# Patient Record
Sex: Female | Born: 1950 | Race: Black or African American | Hispanic: No | Marital: Single | Smoking: Former smoker
Health system: Southern US, Community
[De-identification: ages and names within clinical notes are randomized; demographics above are authoritative.]

## PROBLEM LIST (undated history)

## (undated) DIAGNOSIS — N261 Atrophy of kidney (terminal): Secondary | ICD-10-CM

## (undated) DIAGNOSIS — I480 Paroxysmal atrial fibrillation: Secondary | ICD-10-CM

## (undated) DIAGNOSIS — F419 Anxiety disorder, unspecified: Secondary | ICD-10-CM

## (undated) DIAGNOSIS — E785 Hyperlipidemia, unspecified: Secondary | ICD-10-CM

## (undated) DIAGNOSIS — I472 Ventricular tachycardia, unspecified: Secondary | ICD-10-CM

## (undated) DIAGNOSIS — K449 Diaphragmatic hernia without obstruction or gangrene: Secondary | ICD-10-CM

## (undated) DIAGNOSIS — F32A Depression, unspecified: Secondary | ICD-10-CM

## (undated) DIAGNOSIS — I340 Nonrheumatic mitral (valve) insufficiency: Secondary | ICD-10-CM

## (undated) DIAGNOSIS — I2510 Atherosclerotic heart disease of native coronary artery without angina pectoris: Secondary | ICD-10-CM

## (undated) DIAGNOSIS — M353 Polymyalgia rheumatica: Secondary | ICD-10-CM

## (undated) DIAGNOSIS — Z8673 Personal history of transient ischemic attack (TIA), and cerebral infarction without residual deficits: Secondary | ICD-10-CM

## (undated) DIAGNOSIS — C8590 Non-Hodgkin lymphoma, unspecified, unspecified site: Secondary | ICD-10-CM

## (undated) DIAGNOSIS — K219 Gastro-esophageal reflux disease without esophagitis: Secondary | ICD-10-CM

## (undated) DIAGNOSIS — I255 Ischemic cardiomyopathy: Secondary | ICD-10-CM

## (undated) DIAGNOSIS — E119 Type 2 diabetes mellitus without complications: Secondary | ICD-10-CM

## (undated) DIAGNOSIS — I5022 Chronic systolic (congestive) heart failure: Secondary | ICD-10-CM

## (undated) DIAGNOSIS — N183 Chronic kidney disease, stage 3 unspecified: Secondary | ICD-10-CM

## (undated) DIAGNOSIS — K5790 Diverticulosis of intestine, part unspecified, without perforation or abscess without bleeding: Secondary | ICD-10-CM

## (undated) DIAGNOSIS — F329 Major depressive disorder, single episode, unspecified: Secondary | ICD-10-CM

## (undated) DIAGNOSIS — I10 Essential (primary) hypertension: Secondary | ICD-10-CM

## (undated) DIAGNOSIS — K2970 Gastritis, unspecified, without bleeding: Secondary | ICD-10-CM

## (undated) HISTORY — DX: Non-Hodgkin lymphoma, unspecified, unspecified site: C85.90

## (undated) HISTORY — DX: Ventricular tachycardia, unspecified: I47.20

## (undated) HISTORY — DX: Ventricular tachycardia: I47.2

## (undated) HISTORY — DX: Ischemic cardiomyopathy: I25.5

## (undated) HISTORY — DX: Essential (primary) hypertension: I10

## (undated) HISTORY — DX: Atherosclerotic heart disease of native coronary artery without angina pectoris: I25.10

## (undated) HISTORY — DX: Hyperlipidemia, unspecified: E78.5

## (undated) HISTORY — PX: CORONARY ANGIOPLASTY WITH STENT PLACEMENT: SHX49

## (undated) HISTORY — PX: OTHER SURGICAL HISTORY: SHX169

---

## 1999-01-03 ENCOUNTER — Emergency Department (HOSPITAL_COMMUNITY): Admission: EM | Admit: 1999-01-03 | Discharge: 1999-01-03 | Payer: Self-pay | Admitting: Emergency Medicine

## 2011-03-26 HISTORY — PX: CARDIAC DEFIBRILLATOR PLACEMENT: SHX171

## 2011-12-05 DIAGNOSIS — L280 Lichen simplex chronicus: Secondary | ICD-10-CM | POA: Insufficient documentation

## 2012-05-19 DIAGNOSIS — L659 Nonscarring hair loss, unspecified: Secondary | ICD-10-CM | POA: Insufficient documentation

## 2012-07-19 ENCOUNTER — Emergency Department (HOSPITAL_COMMUNITY)
Admission: EM | Admit: 2012-07-19 | Discharge: 2012-07-19 | Disposition: A | Discharge status: Home / Self Care | Payer: Medicare Other | Attending: Emergency Medicine | Admitting: Emergency Medicine

## 2012-07-19 ENCOUNTER — Encounter (HOSPITAL_COMMUNITY): Payer: Self-pay

## 2012-07-19 ENCOUNTER — Emergency Department (HOSPITAL_COMMUNITY): Payer: Medicare Other

## 2012-07-19 ENCOUNTER — Encounter (HOSPITAL_COMMUNITY): Payer: Self-pay | Admitting: Emergency Medicine

## 2012-07-19 DIAGNOSIS — R42 Dizziness and giddiness: Secondary | ICD-10-CM | POA: Insufficient documentation

## 2012-07-19 DIAGNOSIS — N390 Urinary tract infection, site not specified: Secondary | ICD-10-CM

## 2012-07-19 DIAGNOSIS — I252 Old myocardial infarction: Secondary | ICD-10-CM | POA: Insufficient documentation

## 2012-07-19 DIAGNOSIS — Z7982 Long term (current) use of aspirin: Secondary | ICD-10-CM | POA: Insufficient documentation

## 2012-07-19 DIAGNOSIS — R51 Headache: Secondary | ICD-10-CM | POA: Insufficient documentation

## 2012-07-19 DIAGNOSIS — I639 Cerebral infarction, unspecified: Secondary | ICD-10-CM

## 2012-07-19 DIAGNOSIS — Z9861 Coronary angioplasty status: Secondary | ICD-10-CM | POA: Insufficient documentation

## 2012-07-19 DIAGNOSIS — IMO0002 Reserved for concepts with insufficient information to code with codable children: Secondary | ICD-10-CM | POA: Insufficient documentation

## 2012-07-19 DIAGNOSIS — Z87898 Personal history of other specified conditions: Secondary | ICD-10-CM | POA: Insufficient documentation

## 2012-07-19 DIAGNOSIS — I6350 Cerebral infarction due to unspecified occlusion or stenosis of unspecified cerebral artery: Secondary | ICD-10-CM | POA: Insufficient documentation

## 2012-07-19 DIAGNOSIS — Z4502 Encounter for adjustment and management of automatic implantable cardiac defibrillator: Secondary | ICD-10-CM

## 2012-07-19 DIAGNOSIS — Z95810 Presence of automatic (implantable) cardiac defibrillator: Secondary | ICD-10-CM | POA: Insufficient documentation

## 2012-07-19 DIAGNOSIS — I2510 Atherosclerotic heart disease of native coronary artery without angina pectoris: Secondary | ICD-10-CM | POA: Insufficient documentation

## 2012-07-19 DIAGNOSIS — Z7902 Long term (current) use of antithrombotics/antiplatelets: Secondary | ICD-10-CM | POA: Insufficient documentation

## 2012-07-19 DIAGNOSIS — Z8673 Personal history of transient ischemic attack (TIA), and cerebral infarction without residual deficits: Secondary | ICD-10-CM | POA: Insufficient documentation

## 2012-07-19 DIAGNOSIS — T82198A Other mechanical complication of other cardiac electronic device, initial encounter: Secondary | ICD-10-CM | POA: Insufficient documentation

## 2012-07-19 DIAGNOSIS — Z8679 Personal history of other diseases of the circulatory system: Secondary | ICD-10-CM | POA: Insufficient documentation

## 2012-07-19 DIAGNOSIS — Z950 Presence of cardiac pacemaker: Secondary | ICD-10-CM | POA: Insufficient documentation

## 2012-07-19 DIAGNOSIS — Y831 Surgical operation with implant of artificial internal device as the cause of abnormal reaction of the patient, or of later complication, without mention of misadventure at the time of the procedure: Secondary | ICD-10-CM | POA: Insufficient documentation

## 2012-07-19 DIAGNOSIS — Z79899 Other long term (current) drug therapy: Secondary | ICD-10-CM | POA: Insufficient documentation

## 2012-07-19 HISTORY — DX: Personal history of transient ischemic attack (TIA), and cerebral infarction without residual deficits: Z86.73

## 2012-07-19 LAB — URINE MICROSCOPIC-ADD ON

## 2012-07-19 LAB — COMPREHENSIVE METABOLIC PANEL
Albumin: 3.7 g/dL (ref 3.5–5.2)
BUN: 18 mg/dL (ref 6–23)
Calcium: 9.8 mg/dL (ref 8.4–10.5)
GFR calc Af Amer: 36 mL/min — ABNORMAL LOW (ref >90)
Glucose, Bld: 129 mg/dL — ABNORMAL HIGH (ref 70–99)
Sodium: 141 mEq/L (ref 135–145)
Total Protein: 7.4 g/dL (ref 6.0–8.3)

## 2012-07-19 LAB — CBC WITH DIFFERENTIAL/PLATELET
Basophils Relative: 1 % (ref 0–1)
Eosinophils Absolute: 0.2 10*3/uL (ref 0.0–0.7)
Eosinophils Relative: 2 % (ref 0–5)
Lymphs Abs: 1.9 10*3/uL (ref 0.7–4.0)
MCH: 30.5 pg (ref 26.0–34.0)
MCHC: 35.2 g/dL (ref 30.0–36.0)
MCV: 86.9 fL (ref 78.0–100.0)
Monocytes Relative: 8 % (ref 3–12)
Neutrophils Relative %: 65 % (ref 43–77)
Platelets: 169 10*3/uL (ref 150–400)
RBC: 4.19 MIL/uL (ref 3.87–5.11)

## 2012-07-19 LAB — URINALYSIS, ROUTINE W REFLEX MICROSCOPIC
Glucose, UA: NEGATIVE mg/dL
Nitrite: NEGATIVE
Specific Gravity, Urine: 1.025 (ref 1.005–1.030)
pH: 5.5 (ref 5.0–8.0)

## 2012-07-19 LAB — TROPONIN I: Troponin I: <0.3 ng/mL (ref <0.30)

## 2012-07-19 LAB — MAGNESIUM: Magnesium: 2.1 mg/dL (ref 1.5–2.5)

## 2012-07-19 MED ORDER — CIPROFLOXACIN HCL 500 MG PO TABS: 500.0000 mg | ORAL_TABLET | Freq: Two times a day (BID) | ORAL | Status: DC

## 2012-07-19 NOTE — ED Notes (Signed)
Patient report obtained, care taken over at this time.  

## 2012-07-19 NOTE — ED Notes (Signed)
Patient being transported to pod C at this time, awaiting for consult to cardiology at this time. Patient in stable condition. No acute distress, resp are even and unlabored. Report called to next nurse.

## 2012-07-19 NOTE — ED Provider Notes (Signed)
History    [age]-year-old female presenting after she felt like her to a ICD fired. Happened just before arrival. Patient denies any physical activity. She states that she uses getting change when she felt a very sharp jolt in her chest. Patient seemingly fairly asymptomatic just prior to this. Maybe a brief sensation of lightheadedness. Denied any pain or shortness of breath. Currently no complaints. Has never had her device fired before. She's not quite sure indication for placement. She does have a a meal history of known coronary artery disease status post stenting. Her cardiology care has been in [place].  SN: [id number]  Arrival date & time 07/19/12  1610   First MD Initiated Contact with Patient 07/19/12 1620      Chief Complaint  Patient presents with  . AICD Problem    (Consider location/radiation/quality/duration/timing/severity/associated sxs/prior treatment) HPI  Past Medical History  Diagnosis Date  . Heart disease   . History of stroke 46  . Myocardial infarction     pt states 8  . History of lymphoma     Past Surgical History  Procedure Laterality Date  . Pacemaker insertion    . Cardiac defibrillator placement    . Breast lumpectomy    . Coronary angioplasty with stent placement      History reviewed. No pertinent family history.  History  Substance Use Topics  . Smoking status: Not on file  . Smokeless tobacco: Not on file  . Alcohol Use: Not on file    OB History   Grav Para Term Preterm Abortions TAB SAB Ect Mult Living                  Review of Systems  All systems reviewed and negative, other than as noted in HPI.   Allergies  Potassium-containing compounds; Contrast media; and Flagyl  Home Medications   Current Outpatient Rx  Name  Route  Sig  Dispense  Refill  . amLODipine (NORVASC) 10 MG tablet   Oral   Take 10 mg by mouth daily.         Marland Kitchen aspirin EC 81 MG tablet   Oral   Take 81 mg by mouth daily.         Marland Kitchen  atorvastatin (LIPITOR) 80 MG tablet   Oral   Take 80 mg by mouth at bedtime.         . betamethasone dipropionate (DIPROLENE) 0.05 % ointment   Topical   Apply 1 application topically 3 (three) times daily. Applies to forehead and scalp         . cilostazol (PLETAL) 50 MG tablet   Oral   Take 50 mg by mouth 2 (two) times daily.         . clopidogrel (PLAVIX) 75 MG tablet   Oral   Take 75 mg by mouth daily.         Marland Kitchen ezetimibe (ZETIA) 10 MG tablet   Oral   Take 10 mg by mouth at bedtime.          . isosorbide mononitrate (IMDUR) 30 MG 24 hr tablet   Oral   Take 30 mg by mouth daily.         Marland Kitchen lisinopril-hydrochlorothiazide (PRINZIDE,ZESTORETIC) 20-12.5 MG per tablet   Oral   Take 1 tablet by mouth daily.         . metoprolol succinate (TOPROL-XL) 100 MG 24 hr tablet   Oral   Take 50 mg by mouth daily. Take with or  immediately following a meal.         . Multiple Vitamin (MULTIVITAMIN WITH MINERALS) TABS   Oral   Take 1 tablet by mouth at bedtime.            BP 143/72  Pulse 75  Temp(Src) 98 F (36.7 C) (Oral)  Resp 21  SpO2 95%  Physical Exam  Nursing note and vitals reviewed. Constitutional: She appears well-developed and well-nourished. No distress.  HENT:  Head: Normocephalic and atraumatic.  Eyes: Conjunctivae are normal. Right eye exhibits no discharge. Left eye exhibits no discharge.  Neck: Neck supple.  Cardiovascular: Normal rate, regular rhythm and normal heart sounds.  Exam reveals no gallop and no friction rub.   No murmur heard. Pulmonary/Chest: Effort normal and breath sounds normal. No respiratory distress.  Abdominal: Soft. She exhibits no distension. There is no tenderness.  Musculoskeletal: She exhibits no edema and no tenderness.  Neurological: She is alert.  Skin: Skin is warm and dry.  Psychiatric: She has a normal mood and affect. Her behavior is normal. Thought content normal.    ED Course  Procedures (including  critical care time)  Labs Reviewed  TROPONIN I  MAGNESIUM   Dg Chest 2 View  07/19/2012  *RADIOLOGY REPORT*  Clinical Data: Headache.  CHEST - 2 VIEW  Comparison: No priors.  Findings: Lung volumes are normal.  No consolidative airspace disease.  No pleural effusions.  No pneumothorax.  No pulmonary nodule or mass noted.  Pulmonary vasculature and the cardiomediastinal silhouette are within normal limits. Atherosclerosis in the thoracic aorta.  Left-sided pacemaker device in place with lead tips projecting over the expected location of the right atrium and right ventricular apex.  IMPRESSION: 1. No radiographic evidence of acute cardiopulmonary disease. 2.  Atherosclerosis.   Original Report Authenticated By: Trudie Reed, M.D.    Ct Head Wo Contrast  07/19/2012  *RADIOLOGY REPORT*  Clinical Data: Headache.  CT HEAD WITHOUT CONTRAST  Technique:  Contiguous axial images were obtained from the base of the skull through the vertex without contrast.  Comparison: No priors.  Findings: There is an ill-defined area of decreased attenuation in the right insular region, concerning for acute/subacute right MCA territory ischemia. A well-defined focus of decreased attenuation in the left thalamus is compatible with an old lacunar infarction. The remainder the brain is otherwise normal in appearance. Specifically, no signs of acute intracerebral hemorrhage, and no definite mass, significant mass effect, hydrocephalus or abnormal intra or extra-axial fluid collections. No acute displaced skull fractures are identified.  Visualized paranasal sinuses and mastoids are well pneumatized.  IMPRESSION: 1.  Findings, as above, concerning for acute/subacute right MCA territory infarction in the insular region. 2.  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  Critical Value/emergent results were called by telephone at the time of interpretation on 07/19/2012 at 11:30 a.m. to Dr. Judd Lien, who verbally acknowledged these results.    Original Report Authenticated By: Trudie Reed, M.D.    EKG:  Rhythm: normal sinus Vent. rate 71 BPM PR interval 164 ms QRS duration 118 ms QT/QTc 428/465 ms NS intraventricular delay ST segments: NS St changes Comparison: none    1. Defibrillator discharge       MDM  61y with ICD firing. Interrogated. I episode of VF/VT successfully shocked. Labs from earlier ED visit reviewed. Additional labs normal. Pt has remained asymptomatic through ED stay. I feel safe for DC at this time. Understands need to follow-up with her cardiologist. Precautions  Raeford Razor, MD 07/20/12 [phone]

## 2012-07-19 NOTE — ED Notes (Addendum)
Pt presents to ED via EMS after defibrillator fired one time today.  EMS vitals 144/100.

## 2012-07-19 NOTE — ED Provider Notes (Addendum)
History     CSN: [id number]  Arrival date & time 07/19/12  4098   First MD Initiated Contact with Patient 07/19/12 [phone]      Chief Complaint  Patient presents with  . Headache    (Consider location/radiation/quality/duration/timing/severity/associated sxs/prior treatment) HPI Comments: Patient presents here with headache and concerns that she has a pseudomonas infection.  Her boyfriend was seen here last night for an infection in his groin region that she was told was pseudomonas.  She denies to me she has any cough, fever, chills.  No urinary complaints.  No nausea, vomiting, or diarrhea.    Patient is a 62 y.o. female presenting with headaches. The history is provided by the patient.  Headache Pain location:  Generalized Radiates to:  Does not radiate Onset quality:  Sudden Timing:  Constant Progression:  Unchanged Chronicity:  New Context: activity   Context: not exposure to bright light and not caffeine   Associated symptoms: no cough, no ear pain, no fever and no sore throat     Past Medical History  Diagnosis Date  . Heart disease   . History of stroke 66  . Myocardial infarction     pt states 7  . History of lymphoma     Past Surgical History  Procedure Laterality Date  . Pacemaker insertion    . Cardiac defibrillator placement    . Breast lumpectomy    . Coronary angioplasty with stent placement      No family history on file.  History  Substance Use Topics  . Smoking status: Not on file  . Smokeless tobacco: Not on file  . Alcohol Use: Not on file    OB History   Grav Para Term Preterm Abortions TAB SAB Ect Mult Living                  Review of Systems  Constitutional: Negative for fever and chills.  HENT: Negative for ear pain and sore throat.   Respiratory: Negative for cough and wheezing.   Cardiovascular: Negative for chest pain.  Genitourinary: Negative for frequency and flank pain.  Neurological: Positive for headaches.  All other  systems reviewed and are negative.    Allergies  Flagyl and Contrast media  Home Medications  No current outpatient prescriptions on file.  BP 146/85  Pulse 89  Temp(Src) 98.4 F (36.9 C) (Oral)  Resp 18  SpO2 97%  Physical Exam  Nursing note and vitals reviewed. Constitutional: She is oriented to person, place, and time. She appears well-developed and well-nourished. No distress.  HENT:  Head: Normocephalic and atraumatic.  Eyes: EOM are normal. Pupils are equal, round, and reactive to light.  Neck: Normal range of motion. Neck supple.  Cardiovascular: Normal rate and regular rhythm.  Exam reveals no gallop and no friction rub.   No murmur heard. Pulmonary/Chest: Effort normal and breath sounds normal. No respiratory distress. She has no wheezes.  Abdominal: Soft. Bowel sounds are normal. She exhibits no distension. There is no tenderness.  Musculoskeletal: Normal range of motion.  Neurological: She is alert and oriented to person, place, and time. No cranial nerve deficit. She exhibits normal muscle tone. Coordination normal.  Skin: Skin is warm and dry. She is not diaphoretic.    ED Course  Procedures (including critical care time)  Labs Reviewed - No data to display No results found.   No diagnosis found.    MDM  The patient presents here with complaints of headache and concerns for  a pseudomonal infection.  The workup reveals a subacute stroke on the ct scan which is consistent with the stroke she was diagnosed with last week in [place].  There is no evidence for bleeding or other emergent pathology that might be the cause of her headache.  She is neurologically intact and appears otherwise quite well.  I see no evidence for pseudomonal infection anywhere, but will treat with cipro for what appears to be a uti.  I have also advised her to follow up with her pcp to discuss the stroke and why this happened.  I see no emergent pathology that requires emergent workup or  admission.  Will discharge to home, return prn.        Geoffery Lyons, MD 07/19/12 1300  Geoffery Lyons, MD 07/19/12 1302

## 2012-07-19 NOTE — Discharge Instructions (Signed)
General Headache Without Cause A headache is pain or discomfort felt around the head or neck area. The specific cause of a headache may not be found. There are many causes and types of headaches. A few common ones are:  Tension headaches.  Migraine headaches.  Cluster headaches.  Chronic daily headaches. HOME CARE INSTRUCTIONS   Keep all follow-up appointments with your caregiver or any specialist referral.  Only take over-the-counter or prescription medicines for pain or discomfort as directed by your caregiver.  Lie down in a dark, quiet room when you have a headache.  Keep a headache journal to find out what may trigger your migraine headaches. For example, write down:  What you eat and drink.  How much sleep you get.  Any change to your diet or medicines.  Try massage or other relaxation techniques.  Put ice packs or heat on the head and neck. Use these 3 to 4 times per day for 15 to 20 minutes each time, or as needed.  Limit stress.  Sit up straight, and do not tense your muscles.  Quit smoking if you smoke.  Limit alcohol use.  Decrease the amount of caffeine you drink, or stop drinking caffeine.  Eat and sleep on a regular schedule.  Get 7 to 9 hours of sleep, or as recommended by your caregiver.  Keep lights dim if bright lights bother you and make your headaches worse. SEEK MEDICAL CARE IF:   You have problems with the medicines you were prescribed.  Your medicines are not working.  You have a change from the usual headache.  You have nausea or vomiting. SEEK IMMEDIATE MEDICAL CARE IF:   Your headache becomes severe.  You have a fever.  You have a stiff neck.  You have loss of vision.  You have muscular weakness or loss of muscle control.  You start losing your balance or have trouble walking.  You feel faint or pass out.  You have severe symptoms that are different from your first symptoms. MAKE SURE YOU:   Understand these  instructions.  Will watch your condition.  Will get help right away if you are not doing well or get worse. Document Released: 03/11/2005 Document Revised: 06/03/2011 Document Reviewed: 03/27/2011 [facility] Patient Information 2013 [place], [place].  Urinary Tract Infection A urinary tract infection (UTI) is often caused by a germ (bacteria). A UTI is usually helped with medicine (antibiotics) that kills germs. Take all the medicine until it is gone. Do this even if you are feeling better. You are usually better in 7 to 10 days. HOME CARE   Drink enough water and fluids to keep your pee (urine) clear or pale yellow. Drink:  Cranberry juice.  Water.  Avoid:  Caffeine.  Tea.  Bubbly (carbonated) drinks.  Alcohol.  Only take medicine as told by your doctor.  To prevent further infections:  Pee often.  After pooping (bowel movement), women should wipe from front to back. Use each tissue only once.  Pee before and after having sex (intercourse). Ask your doctor when your test results will be ready. Make sure you follow up and get your test results.  GET HELP RIGHT AWAY IF:   There is very bad back pain or lower belly (abdominal) pain.  You get the chills.  You have a fever.  Your baby is older than 3 months with a rectal temperature of 102 F (38.9 C) or higher.  Your baby is 65 months old or younger with a rectal  temperature of 100.4 F (38 C) or higher.  You feel sick to your stomach (nauseous) or throw up (vomit).  There is continued burning with peeing.  Your problems are not better in 3 days. Return sooner if you are getting worse. MAKE SURE YOU:   Understand these instructions.  Will watch your condition.  Will get help right away if you are not doing well or get worse. Document Released: 08/28/2007 Document Revised: 06/03/2011 Document Reviewed: 08/28/2007 [facility] Patient Information 2013 [place], [place].

## 2012-07-19 NOTE — ED Notes (Signed)
Patient ambulating to restroom at this time. Patient in stable condition. Will continue to monitor.

## 2012-07-19 NOTE — ED Notes (Signed)
Headache this morning after waking up. Pt denies vision changes, nausea, emesis. "My heart is speedy."

## 2012-07-19 NOTE — ED Notes (Signed)
MD at bedside. Dr. Delo. 

## 2012-07-20 LAB — URINE CULTURE

## 2012-08-07 ENCOUNTER — Emergency Department (HOSPITAL_COMMUNITY)
Admission: EM | Admit: 2012-08-07 | Discharge: 2012-08-07 | Disposition: A | Discharge status: Home / Self Care | Payer: Medicare Other | Attending: Emergency Medicine | Admitting: Emergency Medicine

## 2012-08-07 ENCOUNTER — Encounter (HOSPITAL_COMMUNITY): Payer: Self-pay | Admitting: Physical Medicine and Rehabilitation

## 2012-08-07 DIAGNOSIS — Z4502 Encounter for adjustment and management of automatic implantable cardiac defibrillator: Secondary | ICD-10-CM

## 2012-08-07 DIAGNOSIS — Y831 Surgical operation with implant of artificial internal device as the cause of abnormal reaction of the patient, or of later complication, without mention of misadventure at the time of the procedure: Secondary | ICD-10-CM | POA: Insufficient documentation

## 2012-08-07 DIAGNOSIS — IMO0002 Reserved for concepts with insufficient information to code with codable children: Secondary | ICD-10-CM | POA: Insufficient documentation

## 2012-08-07 DIAGNOSIS — Z9861 Coronary angioplasty status: Secondary | ICD-10-CM | POA: Insufficient documentation

## 2012-08-07 DIAGNOSIS — Z7982 Long term (current) use of aspirin: Secondary | ICD-10-CM | POA: Insufficient documentation

## 2012-08-07 DIAGNOSIS — Z79899 Other long term (current) drug therapy: Secondary | ICD-10-CM | POA: Insufficient documentation

## 2012-08-07 DIAGNOSIS — Z7902 Long term (current) use of antithrombotics/antiplatelets: Secondary | ICD-10-CM | POA: Insufficient documentation

## 2012-08-07 DIAGNOSIS — Z87898 Personal history of other specified conditions: Secondary | ICD-10-CM | POA: Insufficient documentation

## 2012-08-07 DIAGNOSIS — Z8673 Personal history of transient ischemic attack (TIA), and cerebral infarction without residual deficits: Secondary | ICD-10-CM | POA: Insufficient documentation

## 2012-08-07 DIAGNOSIS — I252 Old myocardial infarction: Secondary | ICD-10-CM | POA: Insufficient documentation

## 2012-08-07 DIAGNOSIS — T82198A Other mechanical complication of other cardiac electronic device, initial encounter: Secondary | ICD-10-CM | POA: Insufficient documentation

## 2012-08-07 DIAGNOSIS — Z8679 Personal history of other diseases of the circulatory system: Secondary | ICD-10-CM | POA: Insufficient documentation

## 2012-08-07 LAB — POCT I-STAT, CHEM 8
BUN: 22 mg/dL (ref 6–23)
Calcium, Ion: 1.21 mmol/L (ref 1.13–1.30)
Chloride: 106 mEq/L (ref 96–112)
Creatinine, Ser: 1.5 mg/dL — ABNORMAL HIGH (ref 0.50–1.10)
TCO2: 27 mmol/L (ref 0–100)

## 2012-08-07 LAB — MAGNESIUM: Magnesium: 2 mg/dL (ref 1.5–2.5)

## 2012-08-07 MED ORDER — MAGNESIUM SULFATE 40 MG/ML IJ SOLN: 1.0000 g | Freq: Once | INTRAMUSCULAR | Status: DC

## 2012-08-07 MED ORDER — MAGNESIUM OXIDE 400 (241.3 MG) MG PO TABS
400.0000 mg | ORAL_TABLET | Freq: Once | ORAL | Status: AC
  Administered 2012-08-07: 400 mg via ORAL
  Filled 2012-08-07: qty 1

## 2012-08-07 MED ORDER — POTASSIUM CHLORIDE CRYS ER 20 MEQ PO TBCR
40.0000 meq | EXTENDED_RELEASE_TABLET | Freq: Once | ORAL | Status: AC
  Administered 2012-08-07: 40 meq via ORAL
  Filled 2012-08-07: qty 2

## 2012-08-07 NOTE — ED Provider Notes (Signed)
History     CSN: [id number]  Arrival date & time 08/07/12  0804   First MD Initiated Contact with Patient 08/07/12 [phone]      Chief Complaint  Patient presents with  . AICD Problem    (Consider location/radiation/quality/duration/timing/severity/associated sxs/prior treatment) HPI This 62 year old female has a [employer] ICD the discharged this morning and the patient was asymptomatic prior to and after the discharge of the defibrillator, she is no chest pain shortness of breath lightheadedness syncope or amnesia focal neurologic symptoms or other concerns. She was in the ED within the last couple weeks for the same problem. Her interrogation at that time revealed one episode of VF/VT converted. Her cardiologist is in [place], [place] Dr. Rockne Menghini [phone]. There is no treatment prior to arrival. Past Medical History  Diagnosis Date  . Heart disease   . History of stroke 82  . Myocardial infarction     pt states 7  . History of lymphoma     Past Surgical History  Procedure Laterality Date  . Pacemaker insertion    . Cardiac defibrillator placement    . Breast lumpectomy    . Coronary angioplasty with stent placement      No family history on file.  History  Substance Use Topics  . Smoking status: Never Smoker   . Smokeless tobacco: Not on file  . Alcohol Use: No    OB History   Grav Para Term Preterm Abortions TAB SAB Ect Mult Living                  Review of Systems 10 Systems reviewed and are negative for acute change except as noted in the HPI. Allergies  Potassium-containing compounds; Contrast media; and Flagyl  Home Medications   Current Outpatient Rx  Name  Route  Sig  Dispense  Refill  . amLODipine (NORVASC) 10 MG tablet   Oral   Take 10 mg by mouth daily.         Marland Kitchen aspirin EC 81 MG tablet   Oral   Take 81 mg by mouth daily.         Marland Kitchen atorvastatin (LIPITOR) 80 MG tablet   Oral   Take 80 mg by mouth at bedtime.         . betamethasone  dipropionate (DIPROLENE) 0.05 % ointment   Topical   Apply 1 application topically 3 (three) times daily. Applies to forehead and scalp         . cilostazol (PLETAL) 50 MG tablet   Oral   Take 50 mg by mouth 2 (two) times daily.         . clopidogrel (PLAVIX) 75 MG tablet   Oral   Take 75 mg by mouth daily.         Marland Kitchen ezetimibe (ZETIA) 10 MG tablet   Oral   Take 10 mg by mouth at bedtime.          . isosorbide mononitrate (IMDUR) 30 MG 24 hr tablet   Oral   Take 30 mg by mouth daily.         Marland Kitchen lisinopril-hydrochlorothiazide (PRINZIDE,ZESTORETIC) 20-12.5 MG per tablet   Oral   Take 1 tablet by mouth daily.         . metoprolol succinate (TOPROL-XL) 100 MG 24 hr tablet   Oral   Take 50 mg by mouth 2 (two) times daily. Take with or immediately following a meal.         . Multiple  Vitamin (MULTIVITAMIN WITH MINERALS) TABS   Oral   Take 1 tablet by mouth at bedtime.            BP 135/73  Pulse 65  Temp(Src) 97.3 F (36.3 C) (Oral)  Resp 18  SpO2 100%  Physical Exam  Nursing note and vitals reviewed. Constitutional:  Awake, alert, nontoxic appearance.  HENT:  Head: Atraumatic.  Eyes: Right eye exhibits no discharge. Left eye exhibits no discharge.  Neck: Neck supple.  Cardiovascular: Normal rate and regular rhythm.   No murmur heard. Pulmonary/Chest: Effort normal and breath sounds normal. No respiratory distress. She has no wheezes. She has no rales. She exhibits no tenderness.  Abdominal: Soft. There is no tenderness. There is no rebound.  Musculoskeletal: She exhibits no edema and no tenderness.  Baseline ROM, no obvious new focal weakness.  Neurological: She is alert.  Mental status and motor strength appears baseline for patient and situation.  Skin: No rash noted.  Psychiatric: She has a normal mood and affect.    ED Course  Procedures (including critical care time) ECG: Sinus rhythm, history the 64, normal axis, nonspecific  intraventricular conduction delay with QRS duration 116 ms, inverted T waves inferolaterally this, no significant change noted compared with April 2014 D/w Cards Ladona Ridgel in [place] recs check Mag level then discharge.  Pt stable in ED with no significant deterioration in condition.Patient / Family / Caregiver informed of clinical course, understand medical decision-making process, and agree with plan.  Labs Reviewed  POCT I-STAT, CHEM 8 - Abnormal; Notable for the following:    Potassium 3.3 (*)    Creatinine, Ser 1.50 (*)    All other components within normal limits  MAGNESIUM   No results found.   1. Defibrillator discharge       MDM  I doubt any other EMC precluding discharge at this time including, but not necessarily limited to the following:ACS.        Hurman Horn, MD 08/17/12 [phone]

## 2012-08-07 NOTE — ED Notes (Signed)
Pt presents to department via [facility]. States defibrillator fired this morning around 7:15am. States this occurred last week as well. Did follow up with cardiologist. Denies pain at the time. Respirations unlabored. Pt is conscious alert and oriented x4.

## 2012-08-07 NOTE — ED Notes (Signed)
Pt resting quietly at the time. Vital signs stable. Charge RN called to interrogate pacemaker.

## 2012-09-30 ENCOUNTER — Encounter: Payer: Self-pay | Admitting: *Deleted

## 2012-10-20 DIAGNOSIS — E785 Hyperlipidemia, unspecified: Secondary | ICD-10-CM | POA: Insufficient documentation

## 2012-10-20 DIAGNOSIS — I509 Heart failure, unspecified: Secondary | ICD-10-CM | POA: Insufficient documentation

## 2012-10-20 DIAGNOSIS — I219 Acute myocardial infarction, unspecified: Secondary | ICD-10-CM | POA: Insufficient documentation

## 2012-10-20 DIAGNOSIS — C8590 Non-Hodgkin lymphoma, unspecified, unspecified site: Secondary | ICD-10-CM | POA: Insufficient documentation

## 2012-10-20 DIAGNOSIS — I519 Heart disease, unspecified: Secondary | ICD-10-CM | POA: Insufficient documentation

## 2012-10-20 DIAGNOSIS — I10 Essential (primary) hypertension: Secondary | ICD-10-CM | POA: Insufficient documentation

## 2012-10-20 DIAGNOSIS — Z8673 Personal history of transient ischemic attack (TIA), and cerebral infarction without residual deficits: Secondary | ICD-10-CM | POA: Insufficient documentation

## 2012-10-20 DIAGNOSIS — N289 Disorder of kidney and ureter, unspecified: Secondary | ICD-10-CM | POA: Insufficient documentation

## 2012-10-21 ENCOUNTER — Encounter: Payer: Self-pay | Admitting: Cardiovascular Disease

## 2012-10-21 ENCOUNTER — Ambulatory Visit (INDEPENDENT_AMBULATORY_CARE_PROVIDER_SITE_OTHER): Payer: Medicare Other | Admitting: *Deleted

## 2012-10-21 ENCOUNTER — Ambulatory Visit (INDEPENDENT_AMBULATORY_CARE_PROVIDER_SITE_OTHER): Payer: Medicare Other | Admitting: Cardiovascular Disease

## 2012-10-21 VITALS — BP 112/70 | HR 80 | Ht 65.0 in | Wt 205.0 lb

## 2012-10-21 DIAGNOSIS — I428 Other cardiomyopathies: Secondary | ICD-10-CM

## 2012-10-21 DIAGNOSIS — I10 Essential (primary) hypertension: Secondary | ICD-10-CM

## 2012-10-21 DIAGNOSIS — I509 Heart failure, unspecified: Secondary | ICD-10-CM

## 2012-10-21 DIAGNOSIS — Z95810 Presence of automatic (implantable) cardiac defibrillator: Secondary | ICD-10-CM

## 2012-10-21 DIAGNOSIS — I5022 Chronic systolic (congestive) heart failure: Secondary | ICD-10-CM

## 2012-10-21 DIAGNOSIS — I219 Acute myocardial infarction, unspecified: Secondary | ICD-10-CM

## 2012-10-21 LAB — ICD DEVICE OBSERVATION
AL IMPEDENCE ICD: 475 Ohm
AL THRESHOLD: 0.75 V
ATRIAL PACING ICD: 62.41 pct
BATTERY VOLTAGE: 3.1278 V
CHARGE TIME: 10.49 s
DEV-0020ICD: NEGATIVE
PACEART VT: 0
RV LEAD THRESHOLD: 0.75 V
TOT-0006: 20130104000000
TZAT-0001ATACH: 3
TZAT-0002ATACH: NEGATIVE
TZAT-0005FASTVT: 88 pct
TZAT-0011FASTVT: 10 ms
TZAT-0012ATACH: 150 ms
TZAT-0012ATACH: 150 ms
TZAT-0012FASTVT: 200 ms
TZAT-0012SLOWVT: 200 ms
TZAT-0013FASTVT: 1
TZAT-0019ATACH: 6 V
TZAT-0019FASTVT: 8 V
TZAT-0020ATACH: 1.5 ms
TZAT-0020ATACH: 1.5 ms
TZAT-0020SLOWVT: 1.5 ms
TZON-0003FASTVT: 280 ms
TZON-0003SLOWVT: 360 ms
TZON-0003VSLOWVT: 370 ms
TZON-0004SLOWVT: 16
TZON-0005SLOWVT: 12
TZST-0001ATACH: 6
TZST-0001FASTVT: 3
TZST-0001FASTVT: 5
TZST-0001SLOWVT: 2
TZST-0001SLOWVT: 6
TZST-0002ATACH: NEGATIVE
TZST-0002SLOWVT: NEGATIVE
TZST-0002SLOWVT: NEGATIVE
TZST-0002SLOWVT: NEGATIVE
TZST-0003FASTVT: 35 J
TZST-0003FASTVT: 35 J
TZST-0003FASTVT: 35 J
VENTRICULAR PACING ICD: 0.04 pct
VF: 0

## 2012-10-21 NOTE — Assessment & Plan Note (Signed)
Well controlled.  Continue current medications and low sodium Dash type diet.    

## 2012-10-21 NOTE — Progress Notes (Signed)
Patient ID: [name] Terry, female   DOB: [birth year], 62 y.o.   MRN: [id number] 62 yo seen by Dr Earna Coder in [place]  No records  Referred by [facility] family practice. She has poor insight into her history and no records available from cardiology.  Has had issues for 2-3 years Describes 7 heart attacks and what sounds like ischemic DCM.  Last stent ? A year or two ago Intervention done in [place]. She got an AICD in [place] at [facility].  Indicates that device went off a month ago.  No dyspnea or chest pain at this time.  Claims to be compliant with meds.  On Pletal with some pain in legs when ambulating ? PVD  Seen in our ER 5/16 for AICD discharge EF discussed with Dr Liliana Cline and Mg ok  Appropriate discharge for VT/VF  Followed up in [place] and no admission Also appears to have fired appropriately in April No other records at [facility]  Interogated device with Delsa Grana:  May had two appr Rx for VT  First ATP and second with 24J shock.  Impedence and thresholds are fine Optivol normal with no signs of increased volume  ROS: Denies fever, malais, weight loss, blurry vision, decreased visual acuity, cough, sputum, SOB, hemoptysis, pleuritic pain, palpitaitons, heartburn, abdominal pain, melena, lower extremity edema, claudication, or rash.  All other systems reviewed and negative   General: Affect appropriate Healthy:  appears stated age HEENT: normal Neck supple with no adenopathy JVP normal no bruits no thyromegaly Lungs clear with no wheezing and good diaphragmatic motion Heart:  S1/S2 no murmur,rub, gallop or click PMI normal Abdomen: benighn, BS positve, no tenderness, no AAA no bruit.  No HSM or HJR Distal pulses intact with no bruits No edema AICD under left clavicle Neuro non-focal Skin warm and dry No muscular weakness  Medications Current Outpatient Prescriptions  Medication Sig Dispense Refill  . amLODipine (NORVASC) 10 MG tablet Take 10 mg by mouth daily.      Marland Kitchen aspirin EC 81 MG  tablet Take 81 mg by mouth daily.      Marland Kitchen atorvastatin (LIPITOR) 80 MG tablet Take 80 mg by mouth at bedtime.      . betamethasone dipropionate (DIPROLENE) 0.05 % ointment Apply 1 application topically 3 (three) times daily. Applies to forehead and scalp      . cilostazol (PLETAL) 50 MG tablet Take 50 mg by mouth 2 (two) times daily.      . clopidogrel (PLAVIX) 75 MG tablet Take 75 mg by mouth daily.      Marland Kitchen ezetimibe (ZETIA) 10 MG tablet Take 10 mg by mouth at bedtime.       . isosorbide mononitrate (IMDUR) 30 MG 24 hr tablet Take 30 mg by mouth daily.      Marland Kitchen lisinopril-hydrochlorothiazide (PRINZIDE,ZESTORETIC) 20-12.5 MG per tablet Take 1 tablet by mouth daily.      . metoprolol succinate (TOPROL-XL) 100 MG 24 hr tablet Take 50 mg by mouth 2 (two) times daily. Take with or immediately following a meal.      . Multiple Vitamin (MULTIVITAMIN WITH MINERALS) TABS Take 1 tablet by mouth at bedtime.       . potassium chloride SA (K-DUR,KLOR-CON) 20 MEQ tablet Take 20 mEq by mouth daily.       No current facility-administered medications for this visit.    Allergies Potassium-containing compounds; Contrast media; and Flagyl  Family History: Family History  Problem Relation Age of Onset  . Diabetes Father   .  Hypertension Father   . Heart disease Father   . Alcoholism Father   . Hypertension Mother   . Heart disease Mother   . Breast cancer Mother   . Diabetes Brother   . Diabetes Brother   . Diabetes Brother   . Diabetes Brother   . Hypertension Sister   . Heart disease Sister   . Alcoholism Sister   . Thyroid disease Sister     Social History: History   Social History  . Marital Status: Single    Spouse Name: N/A    Number of Children: 2  . Years of Education: N/A   Occupational History  . Not on file.   Social History Main Topics  . Smoking status: Current Some Day Smoker -- 0.25 packs/day  . Smokeless tobacco: Not on file  . Alcohol Use: No  . Drug Use: No  .  Sexually Active: Not on file   Other Topics Concern  . Not on file   Social History Narrative  . No narrative on file    Electrocardiogram:  08/09/12  SR rate 64  IVCD ? Old IMI  Assessment and Plan

## 2012-10-21 NOTE — Assessment & Plan Note (Signed)
Remote interogation set up. Two appr Rx in May and normal function.  F/U with EP in 3 months.

## 2012-10-21 NOTE — Assessment & Plan Note (Signed)
Will get records form [place]  No chest pain.  Stable

## 2012-10-21 NOTE — Assessment & Plan Note (Signed)
Euvolemic continue current meds Optivol activated on AICD and normal

## 2012-10-21 NOTE — Progress Notes (Signed)
ICD check with ICM in office. 

## 2012-11-06 ENCOUNTER — Encounter: Payer: Self-pay | Admitting: Internal Medicine

## 2012-11-06 ENCOUNTER — Ambulatory Visit (INDEPENDENT_AMBULATORY_CARE_PROVIDER_SITE_OTHER): Payer: Medicare Other | Admitting: Internal Medicine

## 2012-11-06 ENCOUNTER — Other Ambulatory Visit: Payer: Self-pay

## 2012-11-06 VITALS — BP 117/77 | HR 90 | Ht 65.0 in | Wt 204.0 lb

## 2012-11-06 DIAGNOSIS — Z9861 Coronary angioplasty status: Secondary | ICD-10-CM | POA: Insufficient documentation

## 2012-11-06 DIAGNOSIS — Z95810 Presence of automatic (implantable) cardiac defibrillator: Secondary | ICD-10-CM

## 2012-11-06 DIAGNOSIS — I472 Ventricular tachycardia: Secondary | ICD-10-CM

## 2012-11-06 DIAGNOSIS — I519 Heart disease, unspecified: Secondary | ICD-10-CM

## 2012-11-06 DIAGNOSIS — I2589 Other forms of chronic ischemic heart disease: Secondary | ICD-10-CM

## 2012-11-06 DIAGNOSIS — I509 Heart failure, unspecified: Secondary | ICD-10-CM

## 2012-11-06 DIAGNOSIS — I4729 Other ventricular tachycardia: Secondary | ICD-10-CM

## 2012-11-06 DIAGNOSIS — I2510 Atherosclerotic heart disease of native coronary artery without angina pectoris: Secondary | ICD-10-CM

## 2012-11-06 DIAGNOSIS — I255 Ischemic cardiomyopathy: Secondary | ICD-10-CM | POA: Insufficient documentation

## 2012-11-06 MED ORDER — METOPROLOL SUCCINATE ER 50 MG PO TB24: 50.0000 mg | ORAL_TABLET | Freq: Two times a day (BID) | ORAL | Status: DC

## 2012-11-06 MED ORDER — METOPROLOL SUCCINATE ER 25 MG PO TB24: 25.0000 mg | ORAL_TABLET | Freq: Two times a day (BID) | ORAL | Status: DC

## 2012-11-06 NOTE — Addendum Note (Signed)
Addended by: Antony Odea on: 11/06/2012 12:57 PM   Modules accepted: Orders

## 2012-11-06 NOTE — Patient Instructions (Addendum)
Your physician recommends that you schedule a follow-up appointment in: KEEP 10/30 APPOINTMENT  Your physician has recommended you make the following change in your medication:   START TWO DIFFERENT STRENGTHS OF METOPROLOL XL FOR A TOTAL OF 75 MG ( ONE 25 MG AND ONE 50 MG TABLET) TWICE DAILY 12 HOURS APART,   NO DRIVING TILL YOU ARE SEEN ON NEXT APPOINTMENT

## 2012-11-06 NOTE — Progress Notes (Signed)
[name] John, MD is PCP Primary Cardiologist:  Dr Shella Maxim Wolfrey is a 62 y.o. female with a h/o ischemic CM sp ICD (MDT) by Dr Levonne Spiller at [facility] who presents today to establish care in the Electrophysiology device clinic.   The patient reports doing very well since having her ICD implanted for primary prevention.  She recently relocated her cardiology care to [facility] with Dr Eden Emms.  She therefore presents to establish in our clinic.  She remains very active despite her cardiomyopathy.   Today, she  denies symptoms of palpitations, chest pain, shortness of breath, orthopnea, PND, lower extremity edema, dizziness, presyncope, syncope, or neurologic sequela.  She has previously received ICD shocks for VT (CL ). The patientis tolerating medications without difficulties and is otherwise without complaint today.   Past Medical History  Diagnosis Date  . Heart disease   . History of stroke 22  . Myocardial infarction   . Lymphoma   . CHF (congestive heart failure)   . HTN (hypertension)   . HLD (hyperlipidemia)   . Kidney disease   . Ventricular tachycardia     CL 300 msec requiring ICD shocks therpay 5/14   Past Surgical History  Procedure Laterality Date  . Cardiac defibrillator placement  03/2011    MDT ICD implanted at [facility] by Dr Levonne Spiller  . Coronary angioplasty with stent placement      History   Social History  . Marital Status: Single    Spouse Name: N/A    Number of Children: 2  . Years of Education: N/A   Occupational History  . Not on file.   Social History Main Topics  . Smoking status: Current Some Day Smoker -- 0.25 packs/day  . Smokeless tobacco: Not on file     Comment: she is not ready to quit  . Alcohol Use: No  . Drug Use: No  . Sexual Activity: Not on file   Other Topics Concern  . Not on file   Social History Narrative  . No narrative on file    Family History  Problem Relation Age of Onset  . Diabetes Father   . Hypertension  Father   . Heart disease Father   . Alcoholism Father   . Hypertension Mother   . Heart disease Mother   . Breast cancer Mother   . Diabetes Brother   . Diabetes Brother   . Diabetes Brother   . Diabetes Brother   . Hypertension Sister   . Heart disease Sister   . Alcoholism Sister   . Thyroid disease Sister     Allergies  Allergen Reactions  . Potassium-Containing Compounds Other (See Comments)    Headaches  . Contrast Media [Iodinated Diagnostic Agents] Rash  . Flagyl [Metronidazole] Swelling and Rash    Face swells    Current Outpatient Prescriptions  Medication Sig Dispense Refill  . amLODipine (NORVASC) 10 MG tablet Take 10 mg by mouth daily.      Marland Kitchen aspirin EC 81 MG tablet Take 81 mg by mouth daily.      Marland Kitchen atorvastatin (LIPITOR) 80 MG tablet Take 80 mg by mouth at bedtime.      . betamethasone dipropionate (DIPROLENE) 0.05 % ointment Apply 1 application topically 3 (three) times daily. Applies to forehead and scalp      . cilostazol (PLETAL) 50 MG tablet Take 50 mg by mouth 2 (two) times daily.      . clopidogrel (PLAVIX) 75 MG tablet Take 75  mg by mouth daily.      Marland Kitchen ezetimibe (ZETIA) 10 MG tablet Take 10 mg by mouth at bedtime.       . isosorbide mononitrate (IMDUR) 30 MG 24 hr tablet Take 30 mg by mouth daily.      Marland Kitchen lisinopril-hydrochlorothiazide (PRINZIDE,ZESTORETIC) 20-12.5 MG per tablet Take 1 tablet by mouth daily.      . metoprolol succinate (TOPROL-XL) 100 MG 24 hr tablet Take 50 mg by mouth 2 (two) times daily. Take with or immediately following a meal.      . Multiple Vitamin (MULTIVITAMIN WITH MINERALS) TABS Take 1 tablet by mouth at bedtime.       . potassium chloride SA (K-DUR,KLOR-CON) 20 MEQ tablet Take 20 mEq by mouth daily.       No current facility-administered medications for this visit.    ROS- all systems are reviewed and negative except as per HPI  Physical Exam: Filed Vitals:   11/06/12 1218  BP: 117/77  Pulse: 90  Height: 5\' 5"  (1.651  m)  Weight: 204 lb (92.534 kg)    GEN- The patient is overweight appearing, alert and oriented x 3 today.   Head- normocephalic, atraumatic Eyes-  Sclera clear, conjunctiva pink Ears- hearing intact Oropharynx- clear Neck- supple, no JVP Lymph- no cervical lymphadenopathy Lungs- Clear to ausculation bilaterally, normal work of breathing Chest- ICD pocket is well healed Heart- Regular rate and rhythm, no murmurs, rubs or gallops, PMI not laterally displaced GI- soft, NT, ND, + BS Extremities- no clubbing, cyanosis, or edema MS- no significant deformity or atrophy Skin- no rash or lesion Psych- euthymic mood, full affect Neuro- strength and sensation are intact  ICD interrogation- reviewed in detail today,  See PACEART report  Assessment and Plan:  1. VT The patient has had sustained VT (CL 300 msec) for which she has required ICD therapy previously (most recently 5/14).  She is stable at this time. I have adjusted her ICD therapy programming to minimize shock therapy by adding a VT zone, increasing ATP attempts, and increasing NIDs.  I will increase toprol xl to 75mg  BID today also.  She is instructed to not drive for 6 months from the time of her last VT episode (5/14).  2. CAD/Ischemic CM No ischemic symptoms euvolemic Continue current medicines  3. Tobacco Cessation advised  Carelink  Return to see me in 2 months

## 2012-11-09 ENCOUNTER — Encounter: Payer: Self-pay | Admitting: Internal Medicine

## 2012-12-23 LAB — ICD DEVICE OBSERVATION
TZAT-0001ATACH: 2
TZAT-0001ATACH: 3
TZAT-0001FASTVT: 1
TZAT-0001SLOWVT: 1
TZAT-0011FASTVT: 10 ms
TZAT-0012ATACH: 150 ms
TZAT-0012ATACH: 150 ms
TZAT-0012ATACH: 150 ms
TZAT-0018ATACH: NEGATIVE
TZAT-0019ATACH: 6 V
TZAT-0019FASTVT: 8 V
TZAT-0020ATACH: 1.5 ms
TZAT-0020SLOWVT: 1.5 ms
TZON-0003ATACH: 350 ms
TZON-0003FASTVT: 280 ms
TZON-0003VSLOWVT: 370 ms
TZON-0004SLOWVT: 16
TZON-0004VSLOWVT: 32
TZON-0005SLOWVT: 12
TZST-0001ATACH: 4
TZST-0001ATACH: 6
TZST-0001FASTVT: 3
TZST-0001FASTVT: 6
TZST-0001SLOWVT: 2
TZST-0001SLOWVT: 3
TZST-0001SLOWVT: 4
TZST-0002ATACH: NEGATIVE
TZST-0002SLOWVT: NEGATIVE
TZST-0002SLOWVT: NEGATIVE
TZST-0003FASTVT: 35 J
TZST-0003FASTVT: 35 J
TZST-0003FASTVT: 35 J

## 2012-12-30 ENCOUNTER — Encounter: Payer: Self-pay | Admitting: Cardiovascular Disease

## 2013-01-21 ENCOUNTER — Encounter: Payer: Medicare Other | Admitting: Internal Medicine

## 2013-03-03 ENCOUNTER — Ambulatory Visit (INDEPENDENT_AMBULATORY_CARE_PROVIDER_SITE_OTHER): Payer: Medicare Other | Admitting: Internal Medicine

## 2013-03-03 ENCOUNTER — Encounter (INDEPENDENT_AMBULATORY_CARE_PROVIDER_SITE_OTHER): Payer: Self-pay

## 2013-03-03 ENCOUNTER — Encounter: Payer: Self-pay | Admitting: Internal Medicine

## 2013-03-03 VITALS — BP 118/76 | HR 79 | Ht 65.0 in | Wt 202.8 lb

## 2013-03-03 DIAGNOSIS — I509 Heart failure, unspecified: Secondary | ICD-10-CM

## 2013-03-03 DIAGNOSIS — I2589 Other forms of chronic ischemic heart disease: Secondary | ICD-10-CM

## 2013-03-03 DIAGNOSIS — I255 Ischemic cardiomyopathy: Secondary | ICD-10-CM

## 2013-03-03 LAB — MDC_IDC_ENUM_SESS_TYPE_INCLINIC
Battery Voltage: 3.13 V
Brady Statistic AP VP Percent: 0.02 %
Brady Statistic AS VS Percent: 28.91 %
Brady Statistic RA Percent Paced: 71.07 %
Brady Statistic RV Percent Paced: 0.04 %
Date Time Interrogation Session: 20141210162840
HighPow Impedance: 64 Ohm
Lead Channel Impedance Value: 399 Ohm
Lead Channel Impedance Value: 475 Ohm
Lead Channel Pacing Threshold Amplitude: 0.75 V
Lead Channel Pacing Threshold Pulse Width: 0.4 ms
Lead Channel Pacing Threshold Pulse Width: 0.4 ms
Lead Channel Sensing Intrinsic Amplitude: 3.375 mV
Lead Channel Setting Pacing Amplitude: 2.5 V
Lead Channel Setting Sensing Sensitivity: 0.3 mV
Zone Setting Detection Interval: 270 ms
Zone Setting Detection Interval: 280 ms
Zone Setting Detection Interval: 350 ms
Zone Setting Detection Interval: 370 ms

## 2013-03-03 NOTE — Patient Instructions (Signed)
Your physician wants you to follow-up in: 3 months with Dr Eden Emms and 6 months with Dr Jacquiline Doe will receive a reminder letter in the mail two months in advance. If you don't receive a letter, please call our office to schedule the follow-up appointment.   Remote monitoring is used to monitor your Pacemaker or ICD from home. This monitoring reduces the number of office visits required to check your device to one time per year. It allows [place] to keep an eye on the functioning of your device to ensure it is working properly. You are scheduled for a device check from home on 06/04/12. You may send your transmission at any time that day. If you have a wireless device, the transmission will be sent automatically. After your physician reviews your transmission, you will receive a postcard with your next transmission date.  Your physician has requested that you have an echocardiogram. Echocardiography is a painless test that uses sound waves to create images of your heart. It provides your doctor with information about the size and shape of your heart and how well your heart's chambers and valves are working. This procedure takes approximately one hour. There are no restrictions for this procedure.

## 2013-03-03 NOTE — Progress Notes (Signed)
PCP: Margorie John, MD Primary Cardiologist:  Dr Shella Maxim [name] Terry is a 62 y.o. female who presents today for routine electrophysiology followup.  Since last being seen in our clinic, the patient reports doing very well.  She is working on weight reduction and lifestyle modification.  Today, she denies symptoms of palpitations, chest pain, shortness of breath,  lower extremity edema, dizziness, presyncope, syncope, or ICD shocks.  The patient is otherwise without complaint today.   Past Medical History  Diagnosis Date  . Heart disease   . History of stroke 7  . Myocardial infarction   . Lymphoma   . CHF (congestive heart failure)   . HTN (hypertension)   . HLD (hyperlipidemia)   . Kidney disease   . Ventricular tachycardia     CL 300 msec requiring ICD shocks therpay 5/14   Past Surgical History  Procedure Laterality Date  . Cardiac defibrillator placement  03/2011    MDT ICD implanted at [facility] by Dr Levonne Spiller  . Coronary angioplasty with stent placement      Current Outpatient Prescriptions  Medication Sig Dispense Refill  . amLODipine (NORVASC) 10 MG tablet Take 10 mg by mouth daily.      Marland Kitchen aspirin EC 81 MG tablet Take 81 mg by mouth daily.      Marland Kitchen atorvastatin (LIPITOR) 80 MG tablet Take 80 mg by mouth at bedtime.      . betamethasone dipropionate (DIPROLENE) 0.05 % ointment Apply 1 application topically 3 (three) times daily. Applies to forehead and scalp      . cilostazol (PLETAL) 50 MG tablet Take 50 mg by mouth 2 (two) times daily.      . clopidogrel (PLAVIX) 75 MG tablet Take 75 mg by mouth daily.      Marland Kitchen ezetimibe (ZETIA) 10 MG tablet Take 10 mg by mouth at bedtime.       . isosorbide mononitrate (IMDUR) 30 MG 24 hr tablet Take 30 mg by mouth daily.      Marland Kitchen lisinopril-hydrochlorothiazide (PRINZIDE,ZESTORETIC) 20-12.5 MG per tablet Take 1 tablet by mouth daily.      . metoprolol succinate (TOPROL-XL) 50 MG 24 hr tablet Take 1 tablet (50 mg total) by mouth 2 (two)  times daily. Take with or immediately following a meal.  60 tablet  5  . Multiple Vitamin (MULTIVITAMIN WITH MINERALS) TABS Take 1 tablet by mouth at bedtime.       . potassium chloride SA (K-DUR,KLOR-CON) 20 MEQ tablet Take 20 mEq by mouth daily.       No current facility-administered medications for this visit.    Physical Exam: Filed Vitals:   03/03/13 1544  BP: 118/76  Pulse: 79  Height: 5\' 5"  (1.651 m)  Weight: 202 lb 12.8 oz (91.989 kg)    GEN- The patient is well appearing, alert and oriented x 3 today.   Head- normocephalic, atraumatic Eyes-  Sclera clear, conjunctiva pink Ears- hearing intact Oropharynx- clear Lungs- Clear to ausculation bilaterally, normal work of breathing Chest- ICD pocket is well healed Heart- Regular rate and rhythm, no murmurs, rubs or gallops, PMI not laterally displaced GI- soft, NT, ND, + BS Extremities- no clubbing, cyanosis, or edema  ICD interrogation- reviewed in detail today,  See PACEART report  Assessment and Plan:  1.  Chronic systolic dysfunction/ Ischemic CM euvolemic today Stable on an appropriate medical regimen Normal ICD function See Pace Art report No changes today She does not have an echo documenting EF in  our system. We will obtain an echo at this time 2 gram sodium diet encouraged We will follow her optivol monthly in our ICM clinic  2. VT Well controlled Continue sotalol  3. Tobacco Cessation advised  Carelink  Return to see Dr Eden Emms in 3 months Return to see me in the device clinic in 6 months

## 2013-03-23 ENCOUNTER — Encounter: Payer: Self-pay | Admitting: Cardiovascular Disease

## 2013-03-23 ENCOUNTER — Ambulatory Visit (HOSPITAL_COMMUNITY): Payer: Medicare Other | Attending: Cardiovascular Disease | Admitting: Radiology

## 2013-03-23 DIAGNOSIS — I059 Rheumatic mitral valve disease, unspecified: Secondary | ICD-10-CM | POA: Insufficient documentation

## 2013-03-23 DIAGNOSIS — I10 Essential (primary) hypertension: Secondary | ICD-10-CM | POA: Insufficient documentation

## 2013-03-23 DIAGNOSIS — I379 Nonrheumatic pulmonary valve disorder, unspecified: Secondary | ICD-10-CM | POA: Insufficient documentation

## 2013-03-23 DIAGNOSIS — E785 Hyperlipidemia, unspecified: Secondary | ICD-10-CM | POA: Insufficient documentation

## 2013-03-23 DIAGNOSIS — I509 Heart failure, unspecified: Secondary | ICD-10-CM | POA: Insufficient documentation

## 2013-03-23 DIAGNOSIS — Z8673 Personal history of transient ischemic attack (TIA), and cerebral infarction without residual deficits: Secondary | ICD-10-CM | POA: Insufficient documentation

## 2013-03-23 DIAGNOSIS — I079 Rheumatic tricuspid valve disease, unspecified: Secondary | ICD-10-CM | POA: Insufficient documentation

## 2013-03-23 DIAGNOSIS — I2589 Other forms of chronic ischemic heart disease: Secondary | ICD-10-CM | POA: Insufficient documentation

## 2013-03-23 DIAGNOSIS — Z87898 Personal history of other specified conditions: Secondary | ICD-10-CM | POA: Insufficient documentation

## 2013-03-23 NOTE — Progress Notes (Signed)
Echocardiogram performed.  

## 2013-04-05 ENCOUNTER — Ambulatory Visit: Payer: Medicare Other | Admitting: *Deleted

## 2013-04-12 ENCOUNTER — Telehealth: Payer: Self-pay | Admitting: *Deleted

## 2013-04-12 NOTE — Telephone Encounter (Signed)
Patient called regarding a message that was left from the device clinic on 04-05-2013. I informed patient that Nira Conn probably called her about the transmission from that date. Patient stated that she did remember that it was Nira Conn that called her. I told patient that Nira Conn was not in the office today, but she will probably call her back when she returns. Patient denies SOB, but did mention that she feels like her heart races whenever she feels stressed and whenever she's doing activities. Patient is without episodes and the histogram distribution is appropriate. Patient aware.

## 2013-04-13 ENCOUNTER — Telehealth: Payer: Self-pay | Admitting: *Deleted

## 2013-04-13 NOTE — Telephone Encounter (Signed)
I left a message for the patient to call for ICM clinic. 

## 2013-04-15 NOTE — Telephone Encounter (Signed)
(  late entry) I left a message at the patient's home and cell #'s around 10 am to call me back for ICM follow up.

## 2013-04-16 NOTE — Telephone Encounter (Signed)
I left patient a message on her cell phone with her Echo results and to call us back with any questions  She is to also call Alvis Lemmings back

## 2013-04-22 ENCOUNTER — Encounter: Payer: Self-pay | Admitting: *Deleted

## 2013-04-22 NOTE — Telephone Encounter (Signed)
Patient never called me back. Letter mailed to her with ICM findings. Will no charge for 04/05/13 ICM visit and reschedule for February.

## 2013-05-02 ENCOUNTER — Emergency Department (HOSPITAL_COMMUNITY): Payer: Medicare Other

## 2013-05-02 ENCOUNTER — Encounter (HOSPITAL_COMMUNITY): Payer: Self-pay | Admitting: Emergency Medicine

## 2013-05-02 ENCOUNTER — Inpatient Hospital Stay (HOSPITAL_COMMUNITY)
Admission: EM | Admit: 2013-05-02 | Discharge: 2013-05-05 | DRG: 287 | Disposition: A | Discharge status: Home / Self Care | Payer: Medicare Other | Attending: Internal Medicine | Admitting: Internal Medicine

## 2013-05-02 DIAGNOSIS — Z8673 Personal history of transient ischemic attack (TIA), and cerebral infarction without residual deficits: Secondary | ICD-10-CM

## 2013-05-02 DIAGNOSIS — I4729 Other ventricular tachycardia: Secondary | ICD-10-CM | POA: Diagnosis not present

## 2013-05-02 DIAGNOSIS — Z9861 Coronary angioplasty status: Secondary | ICD-10-CM

## 2013-05-02 DIAGNOSIS — R079 Chest pain, unspecified: Secondary | ICD-10-CM

## 2013-05-02 DIAGNOSIS — I209 Angina pectoris, unspecified: Secondary | ICD-10-CM | POA: Diagnosis present

## 2013-05-02 DIAGNOSIS — N289 Disorder of kidney and ureter, unspecified: Secondary | ICD-10-CM | POA: Diagnosis present

## 2013-05-02 DIAGNOSIS — N183 Chronic kidney disease, stage 3 unspecified: Secondary | ICD-10-CM | POA: Diagnosis present

## 2013-05-02 DIAGNOSIS — I2589 Other forms of chronic ischemic heart disease: Secondary | ICD-10-CM | POA: Diagnosis present

## 2013-05-02 DIAGNOSIS — I252 Old myocardial infarction: Secondary | ICD-10-CM

## 2013-05-02 DIAGNOSIS — I129 Hypertensive chronic kidney disease with stage 1 through stage 4 chronic kidney disease, or unspecified chronic kidney disease: Secondary | ICD-10-CM | POA: Diagnosis present

## 2013-05-02 DIAGNOSIS — I2510 Atherosclerotic heart disease of native coronary artery without angina pectoris: Principal | ICD-10-CM | POA: Diagnosis present

## 2013-05-02 DIAGNOSIS — I10 Essential (primary) hypertension: Secondary | ICD-10-CM | POA: Diagnosis present

## 2013-05-02 DIAGNOSIS — Z95810 Presence of automatic (implantable) cardiac defibrillator: Secondary | ICD-10-CM | POA: Diagnosis present

## 2013-05-02 DIAGNOSIS — E785 Hyperlipidemia, unspecified: Secondary | ICD-10-CM | POA: Diagnosis present

## 2013-05-02 DIAGNOSIS — F17200 Nicotine dependence, unspecified, uncomplicated: Secondary | ICD-10-CM | POA: Diagnosis present

## 2013-05-02 DIAGNOSIS — C8589 Other specified types of non-Hodgkin lymphoma, extranodal and solid organ sites: Secondary | ICD-10-CM | POA: Diagnosis present

## 2013-05-02 DIAGNOSIS — E876 Hypokalemia: Secondary | ICD-10-CM | POA: Diagnosis present

## 2013-05-02 DIAGNOSIS — I509 Heart failure, unspecified: Secondary | ICD-10-CM

## 2013-05-02 DIAGNOSIS — I255 Ischemic cardiomyopathy: Secondary | ICD-10-CM | POA: Diagnosis present

## 2013-05-02 DIAGNOSIS — I5022 Chronic systolic (congestive) heart failure: Secondary | ICD-10-CM | POA: Diagnosis present

## 2013-05-02 DIAGNOSIS — Z803 Family history of malignant neoplasm of breast: Secondary | ICD-10-CM

## 2013-05-02 DIAGNOSIS — I472 Ventricular tachycardia, unspecified: Secondary | ICD-10-CM | POA: Diagnosis not present

## 2013-05-02 DIAGNOSIS — E119 Type 2 diabetes mellitus without complications: Secondary | ICD-10-CM | POA: Diagnosis present

## 2013-05-02 DIAGNOSIS — Z8249 Family history of ischemic heart disease and other diseases of the circulatory system: Secondary | ICD-10-CM

## 2013-05-02 DIAGNOSIS — Z833 Family history of diabetes mellitus: Secondary | ICD-10-CM

## 2013-05-02 HISTORY — DX: Polymyalgia rheumatica: M35.3

## 2013-05-02 HISTORY — DX: Type 2 diabetes mellitus without complications: E11.9

## 2013-05-02 HISTORY — DX: Chronic kidney disease, stage 3 unspecified: N18.30

## 2013-05-02 HISTORY — DX: Chronic kidney disease, stage 3 (moderate): N18.3

## 2013-05-02 LAB — URINALYSIS, ROUTINE W REFLEX MICROSCOPIC
Bilirubin Urine: NEGATIVE
Glucose, UA: NEGATIVE mg/dL
Hgb urine dipstick: NEGATIVE
KETONES UR: NEGATIVE mg/dL
NITRITE: NEGATIVE
PH: 6 (ref 5.0–8.0)
PROTEIN: NEGATIVE mg/dL
Specific Gravity, Urine: 1.016 (ref 1.005–1.030)
UROBILINOGEN UA: 0.2 mg/dL (ref 0.0–1.0)

## 2013-05-02 LAB — CBC WITH DIFFERENTIAL/PLATELET
BASOS ABS: 0 10*3/uL (ref 0.0–0.1)
BASOS PCT: 0 % (ref 0–1)
EOS PCT: 1 % (ref 0–5)
Eosinophils Absolute: 0.1 10*3/uL (ref 0.0–0.7)
HCT: 38.7 % (ref 36.0–46.0)
Hemoglobin: 13.7 g/dL (ref 12.0–15.0)
LYMPHS PCT: 33 % (ref 12–46)
Lymphs Abs: 2.2 10*3/uL (ref 0.7–4.0)
MCH: 32 pg (ref 26.0–34.0)
MCHC: 35.4 g/dL (ref 30.0–36.0)
MCV: 90.4 fL (ref 78.0–100.0)
Monocytes Absolute: 0.6 10*3/uL (ref 0.1–1.0)
Monocytes Relative: 9 % (ref 3–12)
Neutro Abs: 3.8 10*3/uL (ref 1.7–7.7)
Neutrophils Relative %: 57 % (ref 43–77)
PLATELETS: 203 10*3/uL (ref 150–400)
RBC: 4.28 MIL/uL (ref 3.87–5.11)
RDW: 15.2 % (ref 11.5–15.5)
WBC: 6.7 10*3/uL (ref 4.0–10.5)

## 2013-05-02 LAB — BASIC METABOLIC PANEL
BUN: 25 mg/dL — ABNORMAL HIGH (ref 6–23)
CALCIUM: 9.6 mg/dL (ref 8.4–10.5)
CHLORIDE: 105 meq/L (ref 96–112)
CO2: 24 mEq/L (ref 19–32)
CREATININE: 1.66 mg/dL — AB (ref 0.50–1.10)
GFR, EST AFRICAN AMERICAN: 37 mL/min — AB (ref >90)
GFR, EST NON AFRICAN AMERICAN: 32 mL/min — AB (ref >90)
Glucose, Bld: 106 mg/dL — ABNORMAL HIGH (ref 70–99)
Potassium: 3.6 mEq/L — ABNORMAL LOW (ref 3.7–5.3)
Sodium: 143 mEq/L (ref 137–147)

## 2013-05-02 LAB — PRO B NATRIURETIC PEPTIDE: PRO B NATRI PEPTIDE: 497.4 pg/mL — AB (ref 0–125)

## 2013-05-02 LAB — POCT I-STAT TROPONIN I: TROPONIN I, POC: 0.01 ng/mL (ref 0.00–0.08)

## 2013-05-02 LAB — URINE MICROSCOPIC-ADD ON

## 2013-05-02 LAB — TROPONIN I: Troponin I: <0.3 ng/mL (ref <0.30)

## 2013-05-02 LAB — GLUCOSE, CAPILLARY
GLUCOSE-CAPILLARY: 120 mg/dL — AB (ref 70–99)
Glucose-Capillary: 87 mg/dL (ref 70–99)

## 2013-05-02 MED ORDER — ISOSORBIDE MONONITRATE ER 30 MG PO TB24
30.0000 mg | ORAL_TABLET | Freq: Every day | ORAL | Status: DC
  Administered 2013-05-03 – 2013-05-05 (×3): 30 mg via ORAL
  Filled 2013-05-02 (×3): qty 1

## 2013-05-02 MED ORDER — ASPIRIN EC 81 MG PO TBEC
81.0000 mg | DELAYED_RELEASE_TABLET | Freq: Every day | ORAL | Status: DC
  Administered 2013-05-03 – 2013-05-05 (×3): 81 mg via ORAL
  Filled 2013-05-02 (×3): qty 1

## 2013-05-02 MED ORDER — ONDANSETRON HCL 4 MG/2ML IJ SOLN: 4.0000 mg | Freq: Four times a day (QID) | INTRAMUSCULAR | Status: DC | PRN

## 2013-05-02 MED ORDER — ASPIRIN 81 MG PO CHEW
324.0000 mg | CHEWABLE_TABLET | ORAL | Status: AC
  Filled 2013-05-02: qty 4

## 2013-05-02 MED ORDER — CLOPIDOGREL BISULFATE 75 MG PO TABS
75.0000 mg | ORAL_TABLET | Freq: Every day | ORAL | Status: DC
  Filled 2013-05-02: qty 1

## 2013-05-02 MED ORDER — SODIUM CHLORIDE 0.9 % IJ SOLN: 3.0000 mL | INTRAMUSCULAR | Status: DC | PRN

## 2013-05-02 MED ORDER — GLIPIZIDE ER 2.5 MG PO TB24
2.5000 mg | ORAL_TABLET | Freq: Every day | ORAL | Status: DC
  Administered 2013-05-04 – 2013-05-05 (×2): 2.5 mg via ORAL
  Filled 2013-05-02 (×4): qty 1

## 2013-05-02 MED ORDER — ATORVASTATIN CALCIUM 80 MG PO TABS
80.0000 mg | ORAL_TABLET | Freq: Every day | ORAL | Status: DC
  Administered 2013-05-02 – 2013-05-04 (×3): 80 mg via ORAL
  Filled 2013-05-02 (×4): qty 1

## 2013-05-02 MED ORDER — HEPARIN SODIUM (PORCINE) 5000 UNIT/ML IJ SOLN
5000.0000 [IU] | Freq: Three times a day (TID) | INTRAMUSCULAR | Status: DC
  Administered 2013-05-02 – 2013-05-03 (×2): 5000 [IU] via SUBCUTANEOUS
  Filled 2013-05-02 (×5): qty 1

## 2013-05-02 MED ORDER — EZETIMIBE 10 MG PO TABS
10.0000 mg | ORAL_TABLET | Freq: Every day | ORAL | Status: DC
  Administered 2013-05-02 – 2013-05-04 (×3): 10 mg via ORAL
  Filled 2013-05-02 (×4): qty 1

## 2013-05-02 MED ORDER — ASPIRIN 81 MG PO CHEW
81.0000 mg | CHEWABLE_TABLET | ORAL | Status: AC
  Administered 2013-05-03: 81 mg via ORAL

## 2013-05-02 MED ORDER — SODIUM CHLORIDE 0.9 % IJ SOLN
3.0000 mL | Freq: Two times a day (BID) | INTRAMUSCULAR | Status: DC
  Administered 2013-05-02: 3 mL via INTRAVENOUS

## 2013-05-02 MED ORDER — AMLODIPINE BESYLATE 10 MG PO TABS
10.0000 mg | ORAL_TABLET | Freq: Every day | ORAL | Status: DC
  Administered 2013-05-03 – 2013-05-05 (×3): 10 mg via ORAL
  Filled 2013-05-02 (×3): qty 1

## 2013-05-02 MED ORDER — METOPROLOL SUCCINATE ER 50 MG PO TB24
50.0000 mg | ORAL_TABLET | Freq: Two times a day (BID) | ORAL | Status: DC
  Administered 2013-05-02 – 2013-05-05 (×6): 50 mg via ORAL
  Filled 2013-05-02 (×7): qty 1

## 2013-05-02 MED ORDER — ACETAMINOPHEN 325 MG PO TABS: 650.0000 mg | ORAL_TABLET | ORAL | Status: DC | PRN

## 2013-05-02 MED ORDER — LISINOPRIL 20 MG PO TABS
20.0000 mg | ORAL_TABLET | Freq: Every day | ORAL | Status: DC
  Administered 2013-05-03 – 2013-05-05 (×3): 20 mg via ORAL
  Filled 2013-05-02 (×3): qty 1

## 2013-05-02 MED ORDER — NITROGLYCERIN 0.4 MG SL SUBL
0.4000 mg | SUBLINGUAL_TABLET | SUBLINGUAL | Status: DC | PRN
  Filled 2013-05-02: qty 25

## 2013-05-02 MED ORDER — SODIUM CHLORIDE 0.9 % IV SOLN
INTRAVENOUS | Status: DC
  Administered 2013-05-03: 04:00:00 via INTRAVENOUS

## 2013-05-02 MED ORDER — ASPIRIN 300 MG RE SUPP: 300.0000 mg | RECTAL | Status: AC

## 2013-05-02 MED ORDER — SODIUM CHLORIDE 0.9 % IV SOLN
250.0000 mL | INTRAVENOUS | Status: DC | PRN
Start: 2013-05-02 — End: 2013-05-03

## 2013-05-02 MED ORDER — CLOPIDOGREL BISULFATE 75 MG PO TABS
75.0000 mg | ORAL_TABLET | Freq: Every day | ORAL | Status: DC
  Administered 2013-05-02 – 2013-05-04 (×3): 75 mg via ORAL
  Filled 2013-05-02 (×4): qty 1

## 2013-05-02 MED ORDER — NITROGLYCERIN 2 % TD OINT
1.0000 [in_us] | TOPICAL_OINTMENT | Freq: Once | TRANSDERMAL | Status: DC
  Filled 2013-05-02: qty 1

## 2013-05-02 MED ORDER — SODIUM CHLORIDE 0.9 % IV BOLUS (SEPSIS)
1000.0000 mL | Freq: Once | INTRAVENOUS | Status: AC
  Administered 2013-05-02: 1000 mL via INTRAVENOUS

## 2013-05-02 MED ORDER — ASPIRIN EC 81 MG PO TBEC: 81.0000 mg | DELAYED_RELEASE_TABLET | Freq: Every day | ORAL | Status: DC

## 2013-05-02 NOTE — ED Provider Notes (Signed)
Medical screening examination/treatment/procedure(s) were conducted as a shared visit with non-physician practitioner(s) and myself.  I personally evaluated the patient during the encounter.  EKG Interpretation    Date/Time:  Sunday May 02 2013 11:52:28 EST Ventricular Rate:  60 PR Interval:  209 QRS Duration: 106 QT Interval:  426 QTC Calculation: 426 R Axis:   -10 Text Interpretation:  Sinus rhythm Low voltage, precordial leads Posterior infarct, old Nonspecific T abnormalities, lateral leads, inferior leads - similar to prior Confirmed by Mingo Amber  MD, [place] ([id number]) on 05/02/2013 12:26:08 PM             Diabetic, here with chest pain. No SOB. Angina relieved with nitroglycerin. Was hyperglycemic at home. No hyperglycemia here. Cards to evaluate and will admit.  Osvaldo Shipper, MD 05/02/13 [phone]

## 2013-05-02 NOTE — Progress Notes (Addendum)
The patient arrived to 3E03 from the ED at 2000.  She was oriented to the unit and placed on telemetry.  VS were taken and the patient was assessed.  She is A&Ox4 and does not have any complaints of pain at this time.  She is up ad lib and on room air.  The call bell was explained and placed within reach.

## 2013-05-02 NOTE — ED Provider Notes (Signed)
CSN: [id number]     Arrival date & time 05/02/13  1136 History   First MD Initiated Contact with Patient 05/02/13 1144     Chief Complaint  Patient presents with  . Hyperglycemia  . Chest Pain   (Consider location/radiation/quality/duration/timing/severity/associated sxs/prior Treatment) HPI  63 year old female with history of CHF, hypertension, prior MI, CVA on plavix , and lymphoma presents complaining of chest pain and hyperglycemia. Pt report 2 weeks ago she went to her primary care Dr. for a regular visit. At times she was found to have elevated blood sugar. He prescribed glipizide and request for her to obtain a blood glucose monitor. She has been checking her blood sugar each morning , and states in the 80s. She has not been taking glipizide afraid of dropping her sugar. This morning when she checked her blood sugar it was in the 500s. Her boyfriend recheck of her sugar to his machine it was in the 500 as well. EMS was called. Patient sat on the chair while waiting for EMS and she noticed angina equivalent chest pain. Pain affecting the left chest, nonradiating with associate diaphoresis. Report her heart was racing. When EMS arrived, patient received once a meal nitroglycerin and 3 baby aspirin. Within 20 minutes the pain resolved. She is currently without any active chest pain. No complaints of fever, chills, headache, vision changes, increased thirst, polydipsia, shortness of breath, abdominal pain, numbness or weakness. No productive cough or hemoptysis.  Patient has a Medtronic differential related. She also report having 7 MI in the past. States this pain is "angina pain" her cardiologist is Dr. Rayann Heman.  Past Medical History  Diagnosis Date  . Heart disease   . History of stroke 10  . Myocardial infarction   . Lymphoma   . CHF (congestive heart failure)   . HTN (hypertension)   . HLD (hyperlipidemia)   . Kidney disease   . Ventricular tachycardia     CL 300 msec requiring ICD  shocks therpay 5/14   Past Surgical History  Procedure Laterality Date  . Cardiac defibrillator placement  03/2011    MDT ICD implanted at [facility] by Dr Tana Coast  . Coronary angioplasty with stent placement     Family History  Problem Relation Age of Onset  . Diabetes Father   . Hypertension Father   . Heart disease Father   . Alcoholism Father   . Hypertension Mother   . Heart disease Mother   . Breast cancer Mother   . Diabetes Brother   . Diabetes Brother   . Diabetes Brother   . Diabetes Brother   . Hypertension Sister   . Heart disease Sister   . Alcoholism Sister   . Thyroid disease Sister    History  Substance Use Topics  . Smoking status: Current Some Day Smoker -- 0.25 packs/day  . Smokeless tobacco: Not on file     Comment: she is not ready to quit  . Alcohol Use: No   OB History   Grav Para Term Preterm Abortions TAB SAB Ect Mult Living                 Review of Systems  Constitutional: Negative for fever.  All other systems reviewed and are negative.    Allergies  Potassium-containing compounds; Contrast media; and Flagyl  Home Medications   Current Outpatient Rx  Name  Route  Sig  Dispense  Refill  . amLODipine (NORVASC) 10 MG tablet   Oral   Take 10  mg by mouth daily.         Marland Kitchen aspirin EC 81 MG tablet   Oral   Take 81 mg by mouth daily.         Marland Kitchen atorvastatin (LIPITOR) 80 MG tablet   Oral   Take 80 mg by mouth at bedtime.         . betamethasone dipropionate (DIPROLENE) 0.05 % ointment   Topical   Apply 1 application topically 3 (three) times daily. Applies to forehead and scalp         . cilostazol (PLETAL) 50 MG tablet   Oral   Take 50 mg by mouth 2 (two) times daily.         . clopidogrel (PLAVIX) 75 MG tablet   Oral   Take 75 mg by mouth daily.         Marland Kitchen ezetimibe (ZETIA) 10 MG tablet   Oral   Take 10 mg by mouth at bedtime.          . isosorbide mononitrate (IMDUR) 30 MG 24 hr tablet   Oral   Take 30 mg by  mouth daily.         Marland Kitchen lisinopril-hydrochlorothiazide (PRINZIDE,ZESTORETIC) 20-12.5 MG per tablet   Oral   Take 1 tablet by mouth daily.         . metoprolol succinate (TOPROL-XL) 50 MG 24 hr tablet   Oral   Take 1 tablet (50 mg total) by mouth 2 (two) times daily. Take with or immediately following a meal.   60 tablet   5     PT WILL BE TAKING 75 MG DAILY   . Multiple Vitamin (MULTIVITAMIN WITH MINERALS) TABS   Oral   Take 1 tablet by mouth at bedtime.          . potassium chloride SA (K-DUR,KLOR-CON) 20 MEQ tablet   Oral   Take 20 mEq by mouth daily.          BP 113/65  Pulse 64  Temp(Src) 98.3 F (36.8 C) (Oral)  Resp 17  SpO2 100% Physical Exam  Nursing note and vitals reviewed. Constitutional: She is oriented to person, place, and time. She appears well-developed and well-nourished. No distress.  Awake, alert, nontoxic appearance  HENT:  Head: Atraumatic.  Eyes: Conjunctivae are normal. Right eye exhibits no discharge. Left eye exhibits no discharge.  Neck: Neck supple. No JVD present.  Cardiovascular: Normal rate and regular rhythm.  Exam reveals no gallop and no friction rub.   No murmur heard. Pulmonary/Chest: Effort normal. No respiratory distress. She has no wheezes. She has no rales. She exhibits no tenderness.  Abdominal: Soft. There is no tenderness. There is no rebound.  Musculoskeletal: She exhibits no edema and no tenderness.  ROM appears intact, no obvious focal weakness  Neurological: She is alert and oriented to person, place, and time.  Mental status and motor strength appears intact  Skin: No rash noted.  Psychiatric: She has a normal mood and affect.    ED Course  Procedures (including critical care time)  12:20 PM Patient here with hyperglycemia, and anginal equivalent chest pain. She has no hx of diabetes. Angina pain, relieved with SL nitro and ASA given by EMS.  Currently cp free.  CBG 474 PTA. Has medtronic defibrillator.  Will  interrogate.  Work up initiated.  Low risk of PE, high risk of ACS.    1:02 PM Medtronic was checked and appears to be working normally. ICD interrogation- reviewed  in detail today, See PACEART report Care discussed with Dr. Mingo Amber.  Pt has normal CBG, no evidence of hyperglycemia.  No active CP, ECG and trop neg.  Will consult Dr. Rayann Heman from cardiology.  Hx of dilated cardiomyopathy, prior ischemic heart attack.    2:02 PM i have consulted Cardiologist, Dr. Johnsie Cancel who will see pt in ER and will determine dispo.   Labs Review Labs Reviewed  URINALYSIS, ROUTINE W REFLEX MICROSCOPIC - Abnormal; Notable for the following:    Leukocytes, UA TRACE (*)    All other components within normal limits  BASIC METABOLIC PANEL - Abnormal; Notable for the following:    Potassium 3.6 (*)    Glucose, Bld 106 (*)    BUN 25 (*)    Creatinine, Ser 1.66 (*)    GFR calc non Af Amer 32 (*)    GFR calc Af Amer 37 (*)    All other components within normal limits  CBC WITH DIFFERENTIAL  GLUCOSE, CAPILLARY  URINE MICROSCOPIC-ADD ON  POCT I-STAT TROPONIN I   Imaging Review Dg Chest 2 View  05/02/2013   CLINICAL DATA:  Chest pain.  EXAM: CHEST  2 VIEW  COMPARISON:  07/19/2012  FINDINGS: Pacemaker/ AICD appears unchanged. The heart is mildly enlarged. The aorta is unfolded. Lungs are clear. The vascularity is normal. No effusions. No acute bony finding.  IMPRESSION: No active disease.  Mild cardiomegaly.  Pacemaker/AICD.   Electronically Signed   By: Nelson Chimes M.D.   On: 05/02/2013 13:52    EKG Interpretation    Date/Time:  Sunday May 02 2013 11:52:28 EST Ventricular Rate:  60 PR Interval:  209 QRS Duration: 106 QT Interval:  426 QTC Calculation: 426 R Axis:   -10 Text Interpretation:  Sinus rhythm Low voltage, precordial leads Posterior infarct, old Nonspecific T abnormalities, lateral leads, inferior leads - similar to prior Confirmed by Mingo Amber  MD, [place] ([id number]) on 05/02/2013 12:26:08  PM            MDM   1. Chest pain    BP 111/72  Pulse 107  Temp(Src) 98.3 F (36.8 C) (Oral)  Resp 18  SpO2 100%  I have reviewed nursing notes and vital signs. I personally reviewed the imaging tests through PACS system  I reviewed available ER/hospitalization records thought the EMR     Domenic Moras, [place] 05/02/13 1402

## 2013-05-02 NOTE — ED Notes (Signed)
EKG done

## 2013-05-02 NOTE — H&P (Signed)
Physician History and Physical         Patient ID: [name] Terry MRN: [id number] DOB/AGE: [birth year] 62 y.o. Admit date: 05/02/2013  Primary Care Physician: Chiquita Loth, MD Primary Cardiologist:  [facility]red  Active Problems:   * No active hospital problems. *   HPI:   63 yo seen by Dr Alroy Dust in [place] No records Also sees [place] for primary care in [place]  . She has poor insight into her history and no records available from cardiology. Has had issues for 2-3 years Describes 7 heart attacks and what sounds like ischemic DCM. Last stent ? A year or two ago Intervention done in [place]. She got an AICD in [place] at [facility] with Dr Tana Coast . Indicates that device went off in May   F/U with Dr Rayann Heman and device fired appropriately along with some ATP pacing. He also adjusted her settings to  minimize shock therapy by adding a VT zone, increasing ATP attempts, and increasing NIDs. Toprol was increased to 75 bid. Recently saw primary and started on glipizide.  However she does not take it regularly.  BS over 400 today and she took it.  Was 85 in ER.  Today after breakfast had multiple episodes of SSCP radiating to shoulders and back.  She does not have nitro at home Relief with nitro by EMS  Currently pain free.  Echo ordered by Dr Rayann Heman 12/30 with EF 25-30%  No signs of CHF or dyspnea today   Review of systems complete and found to be negative unless listed above   Past Medical History  Diagnosis Date  . Heart disease   . History of stroke 87  . Myocardial infarction   . Lymphoma   . CHF (congestive heart failure)   . HTN (hypertension)   . HLD (hyperlipidemia)   . Kidney disease   . Ventricular tachycardia     CL 300 msec requiring ICD shocks therpay 5/14    Family History  Problem Relation Age of Onset  . Diabetes Father   . Hypertension Father   . Heart disease Father   . Alcoholism Father   . Hypertension Mother   . Heart disease Mother   . Breast cancer Mother     . Diabetes Brother   . Diabetes Brother   . Diabetes Brother   . Diabetes Brother   . Hypertension Sister   . Heart disease Sister   . Alcoholism Sister   . Thyroid disease Sister     History   Social History  . Marital Status: Single    Spouse Name: N/A    Number of Children: 2  . Years of Education: N/A   Occupational History  . Not on file.   Social History Main Topics  . Smoking status: Current Some Day Smoker -- 0.25 packs/day  . Smokeless tobacco: Not on file     Comment: she is not ready to quit  . Alcohol Use: No  . Drug Use: No  . Sexual Activity: Not on file   Other Topics Concern  . Not on file   Social History Narrative  . No narrative on file    Past Surgical History  Procedure Laterality Date  . Cardiac defibrillator placement  03/2011    MDT ICD implanted at [facility] by Dr Tana Coast  . Coronary angioplasty with stent placement        (Not in a hospital admission)  Physical Exam: Blood pressure 111/72, pulse 107, temperature 98.3 F (36.8  C), temperature source Oral, resp. rate 18, SpO2 100.00%.   Affect appropriate Obese black female  HEENT: normal Neck supple with no adenopathy JVP normal no bruits no thyromegaly Lungs clear with no wheezing and good diaphragmatic motion Heart:  S1/S2 no murmur, no rub, gallop or click PMI normal Abdomen: benighn, BS positve, no tenderness, no AAA no bruit.  No HSM or HJR Distal pulses intact with no bruits No edema Neuro non-focal Skin warm and dry No muscular weakness   Labs:   Lab Results  Component Value Date   WBC 6.7 05/02/2013   HGB 13.7 05/02/2013   HCT 38.7 05/02/2013   MCV 90.4 05/02/2013   PLT 203 05/02/2013    Recent Labs Lab 05/02/13 1225  NA 143  K 3.6*  CL 105  CO2 24  BUN 25*  CREATININE 1.66*  CALCIUM 9.6  GLUCOSE 106*   Lab Results  Component Value Date   TROPONINI <0.30 07/19/2012    . nitroGLYCERIN  1 inch Topical Once     Medication List    ASK your doctor about these  medications       amLODipine 10 MG tablet  Commonly known as:  NORVASC  Take 10 mg by mouth daily.     aspirin EC 81 MG tablet  Take 81 mg by mouth daily.     atorvastatin 80 MG tablet  Commonly known as:  LIPITOR  Take 80 mg by mouth at bedtime.     betamethasone dipropionate 0.05 % ointment  Commonly known as:  DIPROLENE  Apply 1 application topically 3 (three) times daily. Applies to forehead and scalp     cilostazol 50 MG tablet  Commonly known as:  PLETAL  Take 50 mg by mouth 2 (two) times daily.     clopidogrel 75 MG tablet  Commonly known as:  PLAVIX  Take 75 mg by mouth daily.     ezetimibe 10 MG tablet  Commonly known as:  ZETIA  Take 10 mg by mouth at bedtime.     isosorbide mononitrate 30 MG 24 hr tablet  Commonly known as:  IMDUR  Take 30 mg by mouth daily.     lisinopril-hydrochlorothiazide 20-12.5 MG per tablet  Commonly known as:  PRINZIDE,ZESTORETIC  Take 1 tablet by mouth daily.     metoprolol succinate 50 MG 24 hr tablet  Commonly known as:  TOPROL-XL  Take 1 tablet (50 mg total) by mouth 2 (two) times daily. Take with or immediately following a meal.     multivitamin with minerals Tabs tablet  Take 1 tablet by mouth at bedtime.     potassium chloride SA 20 MEQ tablet  Commonly known as:  K-DUR,KLOR-CON  Take 20 mEq by mouth daily.        Radiology: Dg Chest 2 View  05/02/2013   CLINICAL DATA:  Chest pain.  EXAM: CHEST  2 VIEW  COMPARISON:  07/19/2012  FINDINGS: Pacemaker/ AICD appears unchanged. The heart is mildly enlarged. The aorta is unfolded. Lungs are clear. The vascularity is normal. No effusions. No acute bony finding.  IMPRESSION: No active disease.  Mild cardiomegaly.  Pacemaker/AICD.   Electronically Signed   By: Nelson Chimes M.D.   On: 05/02/2013 13:52    EKG:  A pacing low voltage ? Old posterior MI lateral T wave changes no acute ST segment elevation   ASSESSMENT AND PLAN: Chest Pain:  No acute ECG changes troponin negative.   Last stent reported about 2 years ago  in [place].  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  Heparin only if more pain given negative enzymes.   CHF:  Euvolemic hold diuretic for cath Check BNP   Assess EDP at cath. Optivol ok on AICD VT:  Has had shocks and ATP since May need to r/o worsening ischemic substrate as trigger Cath.  Leads look ok on CXR and recent interrogation ok BS:  Check A1c  She is supposed to be on glipizide 2.5 daily but doesn't take  BS 87 in ER  Chol:  Continue statin    Signed: Collier Salina Nishan2/10/2013, 2:10 PM

## 2013-05-02 NOTE — ED Notes (Signed)
Pt arrive to ed via [facility] for Hyperglycemia- 474- onset this AM.  Pt also stated left sided CP with hx and inplanted pacemaker/defib.  ems stated NSR 12 lead.  Pt rec'd 1 SL 0.4 nitro and 3 81mg  ASA from ems.  Pt not use insulin to control sugar- PO meds.  Pt caox4, pmsx4, nad.

## 2013-05-02 NOTE — ED Notes (Signed)
Patient transported to X-ray 

## 2013-05-02 NOTE — ED Notes (Signed)
Pt recently put on Glipizide to control sugar 04/20/13.  No meds prior.

## 2013-05-03 ENCOUNTER — Encounter (HOSPITAL_COMMUNITY)
Admission: EM | Disposition: A | Discharge status: Home / Self Care | Payer: Self-pay | Source: Home / Self Care | Attending: Internal Medicine

## 2013-05-03 ENCOUNTER — Other Ambulatory Visit: Payer: Self-pay

## 2013-05-03 DIAGNOSIS — I4729 Other ventricular tachycardia: Secondary | ICD-10-CM

## 2013-05-03 DIAGNOSIS — I2510 Atherosclerotic heart disease of native coronary artery without angina pectoris: Secondary | ICD-10-CM

## 2013-05-03 DIAGNOSIS — Z95810 Presence of automatic (implantable) cardiac defibrillator: Secondary | ICD-10-CM

## 2013-05-03 DIAGNOSIS — I472 Ventricular tachycardia: Secondary | ICD-10-CM

## 2013-05-03 HISTORY — PX: LEFT AND RIGHT HEART CATHETERIZATION WITH CORONARY ANGIOGRAM: SHX5449

## 2013-05-03 LAB — LIPID PANEL
Cholesterol: 115 mg/dL (ref 0–200)
HDL: 46 mg/dL (ref >39)
LDL Cholesterol: 56 mg/dL (ref 0–99)
Total CHOL/HDL Ratio: 2.5 RATIO
Triglycerides: 64 mg/dL (ref <150)
VLDL: 13 mg/dL (ref 0–40)

## 2013-05-03 LAB — POCT I-STAT 3, ART BLOOD GAS (G3+)
Acid-base deficit: 3 mmol/L — ABNORMAL HIGH (ref 0.0–2.0)
Bicarbonate: 20.7 mEq/L (ref 20.0–24.0)
O2 SAT: 95 %
PCO2 ART: 31 mmHg — AB (ref 35.0–45.0)
TCO2: 22 mmol/L (ref 0–100)
pH, Arterial: 7.432 (ref 7.350–7.450)
pO2, Arterial: 70 mmHg — ABNORMAL LOW (ref 80.0–100.0)

## 2013-05-03 LAB — HEMOGLOBIN A1C
Hgb A1c MFr Bld: 6.3 % — ABNORMAL HIGH (ref <5.7)
Mean Plasma Glucose: 134 mg/dL — ABNORMAL HIGH (ref <117)

## 2013-05-03 LAB — BASIC METABOLIC PANEL
BUN: 24 mg/dL — ABNORMAL HIGH (ref 6–23)
CO2: 23 meq/L (ref 19–32)
Calcium: 9.3 mg/dL (ref 8.4–10.5)
Chloride: 106 mEq/L (ref 96–112)
Creatinine, Ser: 1.57 mg/dL — ABNORMAL HIGH (ref 0.50–1.10)
GFR calc Af Amer: 40 mL/min — ABNORMAL LOW (ref >90)
GFR calc non Af Amer: 35 mL/min — ABNORMAL LOW (ref >90)
GLUCOSE: 73 mg/dL (ref 70–99)
Potassium: 3.6 mEq/L — ABNORMAL LOW (ref 3.7–5.3)
SODIUM: 142 meq/L (ref 137–147)

## 2013-05-03 LAB — PLATELET INHIBITION P2Y12: Platelet Function  P2Y12: 254 [PRU] (ref 194–418)

## 2013-05-03 LAB — GLUCOSE, CAPILLARY
GLUCOSE-CAPILLARY: 196 mg/dL — AB (ref 70–99)
Glucose-Capillary: 128 mg/dL — ABNORMAL HIGH (ref 70–99)
Glucose-Capillary: 76 mg/dL (ref 70–99)

## 2013-05-03 LAB — POCT I-STAT 3, VENOUS BLOOD GAS (G3P V)
ACID-BASE DEFICIT: 3 mmol/L — AB (ref 0.0–2.0)
Bicarbonate: 21.8 mEq/L (ref 20.0–24.0)
O2 Saturation: 66 %
PO2 VEN: 34 mmHg (ref 30.0–45.0)
TCO2: 23 mmol/L (ref 0–100)
pCO2, Ven: 35.8 mmHg — ABNORMAL LOW (ref 45.0–50.0)
pH, Ven: 7.392 — ABNORMAL HIGH (ref 7.250–7.300)

## 2013-05-03 LAB — PROTIME-INR
INR: 1.01 (ref 0.00–1.49)
PROTHROMBIN TIME: 13.1 s (ref 11.6–15.2)

## 2013-05-03 LAB — CBC
HCT: 38.5 % (ref 36.0–46.0)
HEMOGLOBIN: 13.3 g/dL (ref 12.0–15.0)
MCH: 31.2 pg (ref 26.0–34.0)
MCHC: 34.5 g/dL (ref 30.0–36.0)
MCV: 90.4 fL (ref 78.0–100.0)
Platelets: 188 10*3/uL (ref 150–400)
RBC: 4.26 MIL/uL (ref 3.87–5.11)
RDW: 15.3 % (ref 11.5–15.5)
WBC: 6.2 10*3/uL (ref 4.0–10.5)

## 2013-05-03 LAB — TROPONIN I
Troponin I: <0.3 ng/mL (ref <0.30)
Troponin I: <0.3 ng/mL (ref <0.30)

## 2013-05-03 SURGERY — LEFT AND RIGHT HEART CATHETERIZATION WITH CORONARY ANGIOGRAM
Anesthesia: LOCAL

## 2013-05-03 MED ORDER — MIDAZOLAM HCL 2 MG/2ML IJ SOLN
INTRAMUSCULAR | Status: AC
  Filled 2013-05-03: qty 2

## 2013-05-03 MED ORDER — NITROGLYCERIN 0.2 MG/ML ON CALL CATH LAB
INTRAVENOUS | Status: AC
  Filled 2013-05-03: qty 1

## 2013-05-03 MED ORDER — FAMOTIDINE IN NACL 20-0.9 MG/50ML-% IV SOLN
INTRAVENOUS | Status: AC
  Filled 2013-05-03: qty 50

## 2013-05-03 MED ORDER — LIDOCAINE HCL (PF) 1 % IJ SOLN
INTRAMUSCULAR | Status: AC
  Filled 2013-05-03: qty 30

## 2013-05-03 MED ORDER — METHYLPREDNISOLONE SODIUM SUCC 125 MG IJ SOLR
INTRAMUSCULAR | Status: AC
  Filled 2013-05-03: qty 2

## 2013-05-03 MED ORDER — SODIUM CHLORIDE 0.9 % IV SOLN: INTRAVENOUS | Status: AC

## 2013-05-03 MED ORDER — HEPARIN (PORCINE) IN NACL 2-0.9 UNIT/ML-% IJ SOLN
INTRAMUSCULAR | Status: AC
  Filled 2013-05-03: qty 1000

## 2013-05-03 MED ORDER — ALPRAZOLAM 0.25 MG PO TABS
0.2500 mg | ORAL_TABLET | Freq: Three times a day (TID) | ORAL | Status: DC | PRN
  Administered 2013-05-03: 0.25 mg via ORAL
  Filled 2013-05-03: qty 1

## 2013-05-03 MED ORDER — FENTANYL CITRATE 0.05 MG/ML IJ SOLN
INTRAMUSCULAR | Status: AC
  Filled 2013-05-03: qty 2

## 2013-05-03 MED ORDER — DIPHENHYDRAMINE HCL 50 MG/ML IJ SOLN
INTRAMUSCULAR | Status: AC
  Filled 2013-05-03: qty 1

## 2013-05-03 NOTE — Progress Notes (Signed)
Subjective: Denies CP. Has mild SOB.   Objective: Vital signs in last 24 hours: Temp:  [98 F (36.7 C)-98.3 F (36.8 C)] 98.1 F (36.7 C) (02/09 0346) Pulse Rate:  [58-107] 61 (02/09 0346) Resp:  [12-39] 17 (02/09 0346) BP: (91-132)/(59-88) 104/59 mmHg (02/09 0346) SpO2:  [99 %-100 %] 100 % (02/09 0346) Weight:  [196 lb 6.9 oz (89.1 kg)-198 lb 3.1 oz (89.9 kg)] 196 lb 6.9 oz (89.1 kg) (02/09 0346) Last BM Date: 05/02/13  Intake/Output from previous day: 02/08 0701 - 02/09 0700 In: 720 [P.O.:720] Out: 600 [Urine:600] Intake/Output this shift:    Medications Current Facility-Administered Medications  Medication Dose Route Frequency Provider Last Rate Last Dose  . 0.9 %  sodium chloride infusion  250 mL Intravenous PRN Josue Hector, MD      . 0.9 %  sodium chloride infusion   Intravenous Continuous Josue Hector, MD 100 mL/hr at 05/03/13 0402    . acetaminophen (TYLENOL) tablet 650 mg  650 mg Oral Q4H PRN Josue Hector, MD      . amLODipine (NORVASC) tablet 10 mg  10 mg Oral Daily Josue Hector, MD      . aspirin chewable tablet 324 mg  324 mg Oral NOW Josue Hector, MD       Or  . aspirin suppository 300 mg  300 mg Rectal NOW Josue Hector, MD      . aspirin EC tablet 81 mg  81 mg Oral Daily Josue Hector, MD      . atorvastatin (LIPITOR) tablet 80 mg  80 mg Oral QHS Josue Hector, MD   80 mg at 05/02/13 2326  . clopidogrel (PLAVIX) tablet 75 mg  75 mg Oral QHS Josue Hector, MD   75 mg at 05/02/13 2325  . ezetimibe (ZETIA) tablet 10 mg  10 mg Oral QHS Josue Hector, MD   10 mg at 05/02/13 2326  . glipiZIDE (GLUCOTROL XL) 24 hr tablet 2.5 mg  2.5 mg Oral Q breakfast Josue Hector, MD      . heparin injection 5,000 Units  5,000 Units Subcutaneous Q8H Josue Hector, MD   5,000 Units at 05/03/13 0557  . isosorbide mononitrate (IMDUR) 24 hr tablet 30 mg  30 mg Oral Daily Josue Hector, MD      . lisinopril (PRINIVIL,ZESTRIL) tablet 20 mg  20 mg Oral Daily Josue Hector, MD      . metoprolol succinate (TOPROL-XL) 24 hr tablet 50 mg  50 mg Oral BID Josue Hector, MD   50 mg at 05/02/13 2325  . nitroGLYCERIN (NITROSTAT) SL tablet 0.4 mg  0.4 mg Sublingual Q5 Min x 3 PRN Josue Hector, MD      . ondansetron [facility]) injection 4 mg  4 mg Intravenous Q6H PRN Josue Hector, MD      . sodium chloride 0.9 % injection 3 mL  3 mL Intravenous Q12H Josue Hector, MD   3 mL at 05/02/13 2326  . sodium chloride 0.9 % injection 3 mL  3 mL Intravenous PRN Josue Hector, MD        PE: General appearance: alert, cooperative and no distress Lungs: clear to auscultation bilaterally Heart: regular rate and rhythm Extremities: no LEE Pulses: 2+ and symmetric Skin: warm and dry Neurologic: Grossly normal  Lab Results:   Recent Labs  05/02/13 1225 05/03/13 0335  WBC 6.7 6.2  HGB 13.7  13.3  HCT 38.7 38.5  PLT 203 188   BMET  Recent Labs  05/02/13 1225 05/03/13 0335  NA 143 142  K 3.6* 3.6*  CL 105 106  CO2 24 23  GLUCOSE 106* 73  BUN 25* 24*  CREATININE 1.66* 1.57*  CALCIUM 9.6 9.3   PT/INR  Recent Labs  05/03/13 0335  LABPROT 13.1  INR 1.01   Cholesterol  Recent Labs  05/03/13 0335  CHOL 115   Cardiac Panel (last 3 results)  Recent Labs  05/02/13 2050 05/03/13 0335  TROPONINI <0.30 <0.30    Assessment/Plan   Active Problems:   Chest pain  Plan: Admitted yesterday for CP. Cardiac enzymes negative x 2. She has a history of CAD. Plan is for [facility] today. Scr is 1.66. Hold diuretic and ACE-I in anticipation of cath. She his hypokalemic at 3.6. She needs repletion, however "Potassium-containing compounds" is listed as an allergy. ? How to address her hypokalemia. Will defer to MD. INR is WNL at 1.01. HR and BP both stable. She has been NPO since midnight. She is on Plavix. Initial plan when patient was admitted was to check a P2Y12 to assess platelet inhibition. This was not done. Will order. Resume ASA, BB, nitrate and statin. MD  to follow.   2.  Systolic CHF  Volume status looks OK   3.  VT  AICD interrogated yesterday  Working OK     LOS: 1 day    Brittainy M. Ladoris Gene 05/03/2013 7:07 AM Patient seen and examined.  Agree with findings of [place] for L hart cath today. I would hold on K  Minimally decreased  Check in AM.  Continue preprocedure hydration.    Dorris Carnes

## 2013-05-03 NOTE — Progress Notes (Signed)
The consent was signed and placed in the patient's chart. 

## 2013-05-03 NOTE — CV Procedure (Signed)
      Cardiac Catheterization Operative Report  Nikyah Lackman Scalera [id number] 2/9/20154:22 PM Bronson Curb, PA-C  Procedure Performed:  1. Left Heart Catheterization 2. Selective Coronary Angiography 3. Right Heart Catheterization  Operator: Lauree Chandler, MD  Indication: 63 yo female with history of CAD with reported prior PCI/stents in [place], [place], ICM s/p ICD followed by Dr. Rayann Heman. She is also seen as an outpatient by Dr. Johnsie Cancel. Admitted with VT, chest pain.                                  Procedure Details: The risks, benefits, complications, treatment options, and expected outcomes were discussed with the patient. The patient and/or family concurred with the proposed plan, giving informed consent. The patient was brought to the cath lab after IV hydration was begun and oral premedication was given. The patient was further sedated with Versed and Fentanyl. The right groin was prepped and draped in the usual manner. Using the modified Seldinger access technique, a 5 French sheath was placed in the right femoral artery. A 7 French sheath was inserted into the right femoral vein. A balloon tipped catheter was used to perform a right heart catheterization. Standard diagnostic catheters were used to perform selective coronary angiography. A pigtail catheter was used to measure LV pressures. There were no immediate complications. The patient was taken to the recovery area in stable condition.   Hemodynamic Findings: Ao:114/63        LV: 115/1/5 RA: 2             RV: 21/0/4 PA: 19/4 (mean 11)      PCWP:  9 Fick Cardiac Output: 4.97 L/min Fick Cardiac Index: 2.54 L/min/m2 Central Aortic Saturation: 95% Pulmonary Artery Saturation: 66%  Angiographic Findings:  Left main: No obstructive disease.   Left Anterior Descending Artery: Large caliber vessel that courses to the apex. There is diffuse 30% stenosis in the proximal, mid and distal vessel. The first diagonal branch  is moderate in caliber with ostial 30% stenosis.   Circumflex Artery: Moderate caliber vessel with diffuse 50% stenosis in the proximal, mid and distal vessel. There are three patent OM branches. The first OM branch is moderate in caliber with 30% proximal stenosis. The second OM branch is small in caliber with proximal 60% stenosis. The third OM branch is moderate in caliber with 30% proximal stenosis followed by diffuse 90% stenosis distally where the vessel becomes very small in caliber.   Right Coronary Artery: Large caliber dominant vessel with patent mid stent without restenosis. Mild plaque in proximal and distal vessel.   Left Ventricular Angiogram: Deferred.   Impression: 1. Non-obstructive CAD with no focal targets for PCI 2. Ischemic cardiomyopathy  Recommendations: Continue medical management.        Complications:  None; patient tolerated the procedure well.

## 2013-05-03 NOTE — Progress Notes (Signed)
Patient has returned back from Cath Lab, report received and patient is stable; will continue to monitor patient. Ruben Im RN

## 2013-05-03 NOTE — Interval H&P Note (Signed)
History and Physical Interval Note:  05/03/2013 3:40 PM  [name] Terry  has presented today for cardiac cath. She is known to have an ischemic cardiomyopathy with reported CAD but no records have been sought out by the admitting team. Last cath in [place], [place]. Admitted with chest pain. Negative cardiac markers. The various methods of treatment have been discussed with the patient and family. After consideration of risks, benefits and other options for treatment, the patient has consented to  Procedure(s): LEFT AND RIGHT HEART CATHETERIZATION WITH CORONARY ANGIOGRAM (N/A) as a surgical intervention .  The patient's history has been reviewed, patient examined, no change in status, stable for surgery.  I have reviewed the patient's chart and labs.  Questions were answered to the patient's satisfaction.    Cath Lab Visit (complete for each Cath Lab visit)  Clinical Evaluation Leading to the Procedure:   ACS: no  Non-ACS:    Anginal Classification: CCS III  Anti-ischemic medical therapy: Maximal Therapy (2 or more classes of medications)  Non-Invasive Test Results: No non-invasive testing performed  Prior CABG: No previous CABG         MCALHANY,CHRISTOPHER

## 2013-05-04 DIAGNOSIS — I2589 Other forms of chronic ischemic heart disease: Secondary | ICD-10-CM

## 2013-05-04 DIAGNOSIS — I509 Heart failure, unspecified: Secondary | ICD-10-CM

## 2013-05-04 DIAGNOSIS — R079 Chest pain, unspecified: Secondary | ICD-10-CM

## 2013-05-04 DIAGNOSIS — I2510 Atherosclerotic heart disease of native coronary artery without angina pectoris: Secondary | ICD-10-CM

## 2013-05-04 LAB — GLUCOSE, CAPILLARY
GLUCOSE-CAPILLARY: 232 mg/dL — AB (ref 70–99)
GLUCOSE-CAPILLARY: 98 mg/dL (ref 70–99)
Glucose-Capillary: 110 mg/dL — ABNORMAL HIGH (ref 70–99)
Glucose-Capillary: 64 mg/dL — ABNORMAL LOW (ref 70–99)
Glucose-Capillary: 180 mg/dL — ABNORMAL HIGH (ref 70–99)
Glucose-Capillary: 179 mg/dL — ABNORMAL HIGH (ref 70–99)
Glucose-Capillary: 143 mg/dL — ABNORMAL HIGH (ref 70–99)

## 2013-05-04 MED ORDER — INSULIN ASPART 100 UNIT/ML ~~LOC~~ SOLN: 0.0000 [IU] | Freq: Every day | SUBCUTANEOUS | Status: DC

## 2013-05-04 MED ORDER — INSULIN ASPART 100 UNIT/ML ~~LOC~~ SOLN
0.0000 [IU] | Freq: Three times a day (TID) | SUBCUTANEOUS | Status: DC
  Administered 2013-05-04: 1 [IU] via SUBCUTANEOUS
  Administered 2013-05-04: 2 [IU] via SUBCUTANEOUS
  Administered 2013-05-05: 1 [IU] via SUBCUTANEOUS

## 2013-05-04 MED ORDER — INSULIN ASPART 100 UNIT/ML ~~LOC~~ SOLN: 0.0000 [IU] | Freq: Three times a day (TID) | SUBCUTANEOUS | Status: DC

## 2013-05-04 MED ORDER — LIVING WELL WITH DIABETES BOOK
Freq: Once | Status: AC
  Administered 2013-05-04: 17:00:00
  Filled 2013-05-04: qty 1

## 2013-05-04 NOTE — Clinical Documentation Improvement (Signed)
Possible Clinical Conditions?    - CKD Stage I - GFR > OR = 90  - CKD Stage II - GFR 60-89  - CKD Stage III - GFR 30-59  - CKD Stage IV - GFR 15-29  - CKD Stage V - GFR < 15  - ESRD (End Stage Renal Disease)  - Other Condition  - Unable to Clinically Determine   Supporting Information:   - "Kidney Disease" documented in current medical record   - GFR  (black female) 36 to 40 per [facility] Review 06/2012 to 04/2013   - Pre-procedure hydration prior to Heart Cath    - Serial Chemistries this admission   - Known history of HTN and Diabetes  Thank You, Erling Conte ,RN BSN CCDS Certified Clinical Documentation Specialist:  [facility] [facility]

## 2013-05-04 NOTE — Progress Notes (Signed)
Pt complaining of chest discomfort rating it a 4/10 with accompanying diaphoresis.  Pt states she also feels as if her stomach is upset.  Chest pain protocol initiated.  Chest pain resolved independently prior to pt receiving any nitro.  Pt placed on 2L/02 for comfort.  Stat EKG in progress.  Vitals BP 136/78, HR 61, 100 % on 2L/02 via [place].  MD has been made aware. Gae Gallop RN

## 2013-05-04 NOTE — Progress Notes (Signed)
Subjective: Patinet complained of epigastic pain earlier  Now gone   Breathing is OK WOrried about high glu (232)  Doesn't want to go into a diabetic coma.   Objective: Filed Vitals:   05/03/13 1815 05/03/13 2133 05/04/13 0500 05/04/13 1018  BP: 125/74 117/74 116/67 127/73  Pulse:  62 89 60  Temp:  97.9 F (36.6 C) 98.6 F (37 C)   TempSrc:  Oral Oral   Resp:  18 18   Height:      Weight:   198 lb 3.1 oz (89.9 kg)   SpO2:  99% 95%    Weight change: 0 lb (0 kg)  Intake/Output Summary (Last 24 hours) at 05/04/13 1129 Last data filed at 05/04/13 0831  Gross per 24 hour  Intake   1125 ml  Output   1040 ml  Net     85 ml    General: Alert, awake, oriented x3, in no acute distress Neck:  JVP is normal Heart: Regular rate and rhythm, without murmurs, rubs, gallops.  Lungs: Clear to auscultation.  No rales or wheezes. Exemities:  No edema.   Neuro: Grossly intact, nonfocal.  Tele:  SR  Lab Results: Results for orders placed during the hospital encounter of 05/02/13 (from the past 24 hour(s))  POCT I-STAT 3, BLOOD GAS (G3P V)     Status: Abnormal   Collection Time    05/03/13  4:11 PM      Result Value Range   pH, Ven 7.392 (*) 7.250 - 7.300   pCO2, Ven 35.8 (*) 45.0 - 50.0 mmHg   pO2, Ven 34.0  30.0 - 45.0 mmHg   Bicarbonate 21.8  20.0 - 24.0 mEq/L   TCO2 23  0 - 100 mmol/L   O2 Saturation 66.0     Acid-base deficit 3.0 (*) 0.0 - 2.0 mmol/L   Sample type VENOUS     Comment NOTIFIED PHYSICIAN    POCT I-STAT 3, BLOOD GAS (G3+)     Status: Abnormal   Collection Time    05/03/13  4:16 PM      Result Value Range   pH, Arterial 7.432  7.350 - 7.450   pCO2 arterial 31.0 (*) 35.0 - 45.0 mmHg   pO2, Arterial 70.0 (*) 80.0 - 100.0 mmHg   Bicarbonate 20.7  20.0 - 24.0 mEq/L   TCO2 22  0 - 100 mmol/L   O2 Saturation 95.0     Acid-base deficit 3.0 (*) 0.0 - 2.0 mmol/L   Sample type ARTERIAL    GLUCOSE, CAPILLARY     Status: Abnormal   Collection Time    05/03/13  4:33 PM       Result Value Range   Glucose-Capillary 64 (*) 70 - 99 mg/dL  GLUCOSE, CAPILLARY     Status: Abnormal   Collection Time    05/03/13  5:13 PM      Result Value Range   Glucose-Capillary 110 (*) 70 - 99 mg/dL  GLUCOSE, CAPILLARY     Status: Abnormal   Collection Time    05/03/13  9:32 PM      Result Value Range   Glucose-Capillary 196 (*) 70 - 99 mg/dL   Comment 1 Notify RN    GLUCOSE, CAPILLARY     Status: Abnormal   Collection Time    05/04/13  5:10 AM      Result Value Range   Glucose-Capillary 180 (*) 70 - 99 mg/dL  GLUCOSE, CAPILLARY     Status: Abnormal  Collection Time    05/04/13 11:02 AM      Result Value Range   Glucose-Capillary 232 (*) 70 - 99 mg/dL   Comment 1 Notify RN      Studies/Results: @RISRSLT24 @  Medications:  Reviewed   @PROBHOSP @  1.  CP  This AM had spell I do not think it was cardiac.    2.  CAD  R and L heart cath yesterday.  Normal R sided pressures.  LAD with 30% plaquing  LCx with diffuse 50  OM3 with distal diffuse 90%  Very small  RCA with patent stent.  I do not think symptoms due to ischemia  Continue medical Rx.  3. DM  Need to have dietary talk to patient  I don't think she understands.  4.  CHF  VOlume status OK  CHronic systolic  5.  VT  S/p ICD.  \  Will get up and ambulate  Should be able to go home later or in AM  Really needs dietary to see  LOS: 2 days   Dorris Carnes 05/04/2013, 11:29 AM

## 2013-05-04 NOTE — Progress Notes (Signed)
Inpatient Diabetes Program Recommendations  AACE/ADA: New Consensus Statement on Inpatient Glycemic Control (2013)  Target Ranges:  Prepandial:   less than 140 mg/dL      Peak postprandial:   less than 180 mg/dL (1-2 hours)      Critically ill patients:  140 - 180 mg/dL   Consult for education  Home: Glipizide 2.5 mg/day HgbA1C @ 6.3%, average glucose of 130 mg/dL. Noted pt voiced fear of hyperglycemia with glucose reading in 200's today Sensitive correction tidwc and HS scale ordered to start with evening meal. Will talk with patient regarding glucose control and acute and chronic complications.  Thank you, Rosita Kea, RN, CNS, Diabetes Coordinator [phone])

## 2013-05-04 NOTE — Clinical Documentation Improvement (Signed)
Possible Clinical Conditions   Based on your clinical judgment, please document in the progress notes and discharge summary the Known, Possible, Probable or Suspected cause of the patient's Chest Pain.   Thank You, Erling Conte ,RN BSN CCDS Certified Clinical Documentation Specialist:  [facility]

## 2013-05-04 NOTE — Progress Notes (Addendum)
Spoke with patient at length regarding her DM, which was only diagnosed approx 5 days ago. She has had very little education other than how to check her glucose (taught by her boyfriend) and was given a list of foods not to eat.  She also was prescribed Glipizide 2.5 mg/day but she had no idea of what that medication acted.  Have explained how to watch videos on system ed'l network, ordered teaching booklet and handouts and explained to her the meaning of elevated glucose and how glipizide works, how important frequent small meals, etc. RD consult ordered earlier today.  Please order OP education Thank you, Rosita Kea, RN, CNS, Diabetes Coordinator [phone])

## 2013-05-05 ENCOUNTER — Encounter (HOSPITAL_COMMUNITY): Payer: Self-pay | Admitting: Cardiology

## 2013-05-05 DIAGNOSIS — E119 Type 2 diabetes mellitus without complications: Secondary | ICD-10-CM | POA: Diagnosis present

## 2013-05-05 DIAGNOSIS — N183 Chronic kidney disease, stage 3 unspecified: Secondary | ICD-10-CM | POA: Diagnosis present

## 2013-05-05 LAB — GLUCOSE, CAPILLARY
GLUCOSE-CAPILLARY: 81 mg/dL (ref 70–99)
Glucose-Capillary: 125 mg/dL — ABNORMAL HIGH (ref 70–99)

## 2013-05-05 MED ORDER — NITROGLYCERIN 0.4 MG SL SUBL
0.4000 mg | SUBLINGUAL_TABLET | SUBLINGUAL | Status: DC | PRN
Start: 2013-05-05 — End: 2014-05-25

## 2013-05-05 MED ORDER — PANTOPRAZOLE SODIUM 40 MG PO TBEC: 40.0000 mg | DELAYED_RELEASE_TABLET | Freq: Every day | ORAL | Status: DC

## 2013-05-05 MED ORDER — LISINOPRIL 20 MG PO TABS: 20.0000 mg | ORAL_TABLET | Freq: Every day | ORAL | Status: DC

## 2013-05-05 MED ORDER — PANTOPRAZOLE SODIUM 40 MG PO TBEC
40.0000 mg | DELAYED_RELEASE_TABLET | Freq: Every day | ORAL | Status: DC
  Administered 2013-05-05: 40 mg via ORAL
  Filled 2013-05-05: qty 1

## 2013-05-05 MED ORDER — ACETAMINOPHEN 325 MG PO TABS: 650.0000 mg | ORAL_TABLET | ORAL | Status: DC | PRN

## 2013-05-05 NOTE — Progress Notes (Signed)
Discussed discharge instructions with pt including how and when to call MD, follow up appts, medications to take at home, diet, and activity. Pt verbalized understanding and denied any questions.

## 2013-05-05 NOTE — Discharge Instructions (Signed)
Watch your diet.  Take your diabetic medicine.  We have arranged classes for you to attend to help your manage your diabetes better.  Call if any cardiac problems to [phone]  Call [facility] if any bleeding, swelling or drainage at cath site.  May shower, no tub baths for 48 hours for groin sticks.

## 2013-05-05 NOTE — Progress Notes (Signed)
Subjective: No complaints  Objective: Vital signs in last 24 hours: Temp:  [97.8 F (36.6 C)-98.4 F (36.9 C)] 97.8 F (36.6 C) (02/11 0700) Pulse Rate:  [60-71] 71 (02/11 0952) Resp:  [18-20] 18 (02/11 0700) BP: (95-136)/(59-80) 126/59 mmHg (02/11 0952) SpO2:  [97 %-100 %] 100 % (02/11 0700) Weight:  [197 lb 9.6 oz (89.631 kg)] 197 lb 9.6 oz (89.631 kg) (02/11 0700) Weight change: -9.5 oz (-0.269 kg) Last BM Date: 05/04/13 Intake/Output from previous day: -1170 02/10 0701 - 02/11 0700 In: 1080 [P.O.:1080] Out: 2250 [Urine:2250] Intake/Output this shift: Total I/O In: 240 [P.O.:240] Out: -   PE: General:Pleasant affect, NAD Skin:Warm and dry, brisk capillary refill HEENT:normocephalic, sclera clear, mucus membranes moist Heart:S1S2 RRR without murmur, gallup, rub or click Lungs:clear without rales, rhonchi, or wheezes JSH:FWYO, non tender, + BS, do not palpate liver spleen or masses Ext:no lower ext edema, 2+ pedal pulses, 2+ radial pulses Neuro:alert and oriented, MAE, follows commands, + facial symmetry  TELE:  Atrial pacing, v sensing, 1 episode of NSVT though slow rate 4-5 beats.  Lab Results:  Recent Labs  05/02/13 1225 05/03/13 0335  WBC 6.7 6.2  HGB 13.7 13.3  HCT 38.7 38.5  PLT 203 188   BMET  Recent Labs  05/02/13 1225 05/03/13 0335  NA 143 142  K 3.6* 3.6*  CL 105 106  CO2 24 23  GLUCOSE 106* 73  BUN 25* 24*  CREATININE 1.66* 1.57*  CALCIUM 9.6 9.3    Recent Labs  05/03/13 0335 05/03/13 0950  TROPONINI <0.30 <0.30    Lab Results  Component Value Date   CHOL 115 05/03/2013   HDL 46 05/03/2013   LDLCALC 56 05/03/2013   TRIG 64 05/03/2013   CHOLHDL 2.5 05/03/2013   Lab Results  Component Value Date   HGBA1C 6.3* 05/02/2013     No results found for this basename: TSH    Hepatic Function Panel No results found for this basename: PROT, ALBUMIN, AST, ALT, ALKPHOS, BILITOT, BILIDIR, IBILI,  in the last 72 hours  Recent Labs  05/03/13 0335  CHOL 115   No results found for this basename: PROTIME,  in the last 72 hours     Studies/Results: No results found.  Medications: I have reviewed the patient's current medications. Scheduled Meds: . amLODipine  10 mg Oral Daily  . aspirin EC  81 mg Oral Daily  . atorvastatin  80 mg Oral QHS  . clopidogrel  75 mg Oral QHS  . ezetimibe  10 mg Oral QHS  . glipiZIDE  2.5 mg Oral Q breakfast  . insulin aspart  0-5 Units Subcutaneous QHS  . insulin aspart  0-9 Units Subcutaneous TID WC  . isosorbide mononitrate  30 mg Oral Daily  . lisinopril  20 mg Oral Daily  . metoprolol succinate  50 mg Oral BID   Continuous Infusions:  PRN Meds:.acetaminophen, ALPRAZolam, nitroGLYCERIN, ondansetron (ZOFRAN) IV  Assessment/Plan: Principal Problem:   Chest pain Active Problems:   HTN (hypertension)   HLD (hyperlipidemia)   AICD (automatic cardioverter/defibrillator) present, dual chamber medtronic   DM2 (diabetes mellitus, type 2), newly diagnosed   CKD (chronic kidney disease) stage 3, GFR 30-59 ml/min  PLAN: non obstructive CAD with no focal targets for PCI -3rd OM with diffuse 90% distal stenosis, treat medically. 2. Ischemic cardiomyopathy EF by echo 02/2013 25% with hx of 7 heart attacks. She does have ICD New diabetes on glucotrol low dose  with glucose up and down. Diabetes coordinator has seen but do not see note from RD Chest pain ? GI cause add protonix She had pressure today but improved with belching.    LOS: 3 days   Time spent with pt. :15 minutes. [facility] R  Nurse Practitioner Certified Pager [phone] or after 5pm and on weekends call [phone] 05/05/2013, 9:55 AM   Patient seen and examined.  Please see my note.

## 2013-05-05 NOTE — Progress Notes (Signed)
Subjective: No CP  No SOB   Objective: Filed Vitals:   05/04/13 1937 05/04/13 2225 05/05/13 0306 05/05/13 0700  BP: 115/62 128/79 95/73 124/80  Pulse: 60 61 62 65  Temp: 98.4 F (36.9 C) 98 F (36.7 C) 97.8 F (36.6 C) 97.8 F (36.6 C)  TempSrc: Oral Oral Oral Oral  Resp: 20 18 18 18   Height:      Weight:    197 lb 9.6 oz (89.631 kg)  SpO2: 98% 97% 100% 100%   Weight change: -9.5 oz (-0.269 kg)  Intake/Output Summary (Last 24 hours) at 05/05/13 0951 Last data filed at 05/05/13 0836  Gross per 24 hour  Intake    960 ml  Output   2250 ml  Net  -1290 ml    General: Alert, awake, oriented x3, in no acute distress Neck:  JVP is normal Heart: Regular rate and rhythm, without murmurs, rubs, gallops.  Lungs: Clear to auscultation.  No rales or wheezes. Exemities:  No edema.   Neuro: Grossly intact, nonfocal.  Tele  SR  Lab Results: Results for orders placed during the hospital encounter of 05/02/13 (from the past 24 hour(s))  GLUCOSE, CAPILLARY     Status: Abnormal   Collection Time    05/04/13 11:02 AM      Result Value Ref Range   Glucose-Capillary 232 (*) 70 - 99 mg/dL   Comment 1 Notify RN    GLUCOSE, CAPILLARY     Status: Abnormal   Collection Time    05/04/13 12:42 PM      Result Value Ref Range   Glucose-Capillary 179 (*) 70 - 99 mg/dL   Comment 1 Notify RN    GLUCOSE, CAPILLARY     Status: Abnormal   Collection Time    05/04/13  4:59 PM      Result Value Ref Range   Glucose-Capillary 143 (*) 70 - 99 mg/dL   Comment 1 Notify RN    GLUCOSE, CAPILLARY     Status: None   Collection Time    05/04/13  9:01 PM      Result Value Ref Range   Glucose-Capillary 98  70 - 99 mg/dL  GLUCOSE, CAPILLARY     Status: None   Collection Time    05/05/13  6:28 AM      Result Value Ref Range   Glucose-Capillary 81  70 - 99 mg/dL    Studies/Results: @RISRSLT24 @  Medications: Reviewed   @PROBHOSP @  1.  CAD  Cath on mon with normal right sided pressures  Mild  plaque LAD  OM3 small with 90% diffuse.  RCA with patent stent.   Will d/c today on medical Rx.    2  CP  Denies  2.  DM  Appreciate help for diabetic coordinator F/U through primary MD 3.  CHF  Chronic systolic  Volume status looks good   4.  VT  S/p ICD  LOS: 3 days   Dorris Carnes 05/05/2013, 9:51 AM

## 2013-05-05 NOTE — Discharge Summary (Signed)
Physician Discharge Summary       Patient ID: [name] Terry MRN: [id number] DOB/AGE: April 25, 1950 63 y.o.  Admit date: 05/02/2013 Discharge date: 05/05/2013  Discharge Diagnoses:  Principal Problem:   Chest pain, negative MI, non obstructive CAD , may be GI component of chest pain.  Active Problems:   HTN (hypertension)   HLD (hyperlipidemia)   AICD (automatic cardioverter/defibrillator) present, dual chamber medtronic   Cardiomyopathy, ischemic   CAD (coronary artery disease)   DM2 (diabetes mellitus, type 2), newly diagnosed   CKD (chronic kidney disease) stage 3, GFR 30-59 ml/min   Discharged Condition: good  Procedures: 05/03/13 cardiac cath by Dr. Shon Millet Course: 63 yo seen by Dr Alroy Dust in [place], no records though hx of PCI/stents in [place]  Also sees Goff for primary care in [place] . She has poor insight into her history and no records available from cardiology. Has had issues for 2-3 years Describes 7 heart attacks and what sounds like ischemic DCM. Last stent ? A year or two ago Intervention done in [place]. She got an AICD in [place] at [facility] with Dr Tana Coast . Indicates that device went off in May F/U with Dr Rayann Heman and device fired appropriately along with some ATP pacing. He also adjusted her settings to minimize shock therapy by adding a VT zone, increasing ATP attempts, and increasing NIDs. Toprol was increased to 75 bid. Recently saw primary and started on glipizide. However she does not take it regularly. BS over 400 on day of admit and she took it. Was 16 in ER. 05/02/13 after breakfast had multiple episodes of SSCP radiating to shoulders and back. She does not have nitro at home Relief with nitro by EMS Currently pain free. Echo ordered by Dr Rayann Heman 12/30 with EF 25-30% No signs of CHF or dyspnea on admit or discharge.  Optivol was ok on AICD.   She was admitted and no MI with negative Troponin.  She underwent cardiac cath:    Hemodynamic Findings:    Ao:114/63  LV: 115/1/5  RA: 2  RV: 21/0/4  PA: 19/4 (mean 11)  PCWP: 9  Fick Cardiac Output: 4.97 L/min  Fick Cardiac Index: 2.54 L/min/m2  Central Aortic Saturation: 95%  Pulmonary Artery Saturation: 66%  Angiographic Findings:  Left main: No obstructive disease.  Left Anterior Descending Artery: Large caliber vessel that courses to the apex. There is diffuse 30% stenosis in the proximal, mid and distal vessel. The first diagonal branch is moderate in caliber with ostial 30% stenosis.  Circumflex Artery: Moderate caliber vessel with diffuse 50% stenosis in the proximal, mid and distal vessel. There are three patent OM branches. The first OM branch is moderate in caliber with 30% proximal stenosis. The second OM branch is small in caliber with proximal 60% stenosis. The third OM branch is moderate in caliber with 30% proximal stenosis followed by diffuse 90% stenosis distally where the vessel becomes very small in caliber.  Right Coronary Artery: Large caliber dominant vessel with patent mid stent without restenosis. Mild plaque in proximal and distal vessel.  Left Ventricular Angiogram: Deferred.   The next AM she had chest pain without EKG changes.  Pain resolved quickly on it's own.   Pt has difficulty understanding her diabetes.  She was seen by the diabetic coordinator, dietician and we have arranged out pt classes.     Pt was given IV fluids prior to cath and post cath Cr. 1.55.  By 05/05/13 pt was stable with better  understanding of her diabetes and no further pain.  We added Protonix for poss GI cause of her pain.  She was seen and found stable for discharge by Dr. Harrington Challenger.  She will follow up with PCP for diabetes and keep appt. With Dr. Johnsie Cancel.   Consults: cardiology  Significant Diagnostic Studies:  BMET    Component Value Date/Time   NA 142 05/03/2013 0335   K 3.6* 05/03/2013 0335   CL 106 05/03/2013 0335   CO2 23 05/03/2013 0335   GLUCOSE 73 05/03/2013 0335   BUN 24* 05/03/2013  0335   CREATININE 1.57* 05/03/2013 0335   CALCIUM 9.3 05/03/2013 0335   GFRNONAA 35* 05/03/2013 0335   GFRAA 40* 05/03/2013 0335    CBC    Component Value Date/Time   WBC 6.2 05/03/2013 0335   RBC 4.26 05/03/2013 0335   HGB 13.3 05/03/2013 0335   HCT 38.5 05/03/2013 0335   PLT 188 05/03/2013 0335   MCV 90.4 05/03/2013 0335   MCH 31.2 05/03/2013 0335   MCHC 34.5 05/03/2013 0335   RDW 15.3 05/03/2013 0335   LYMPHSABS 2.2 05/02/2013 1225   MONOABS 0.6 05/02/2013 1225   EOSABS 0.1 05/02/2013 1225   BASOSABS 0.0 05/02/2013 1225    Troponin <0.30 X 3 P2Y12 254   Discharge Exam: Blood pressure 126/59, pulse 71, temperature 97.8 F (36.6 C), temperature source Oral, resp. rate 18, height 5\' 5"  (1.651 m), weight 197 lb 9.6 oz (89.631 kg), SpO2 100.00%.    Disposition: 01-Home or Self Care      Discharge Orders   Future Appointments Provider Department Dept Phone   05/06/2013 10:10 AM Sharlot Gowda [place] Office [phone]   06/04/2013 9:30 AM [facility] [place] [facility] [phone]   06/09/2013 3:30 PM Josue Hector, MD [facility] [phone]   Future Orders Complete By Expires   Ambulatory referral to Nutrition and Diabetic Education  As directed    Comments:     MD would like patient to have OP DM education.  Patient: Please call the [facility] after discharge to schedule an appointment for diabetes education if you do not hear from the center before discharge  [phone]       Medication List    STOP taking these medications       lisinopril-hydrochlorothiazide 20-12.5 MG per tablet  Commonly known as:  PRINZIDE,ZESTORETIC     potassium chloride SA 20 MEQ tablet  Commonly known as:  K-DUR,KLOR-CON      TAKE these medications       acetaminophen 325 MG tablet  Commonly known as:  TYLENOL  Take 2 tablets (650 mg total) by mouth every 4 (four) hours as needed for headache or mild  pain.     amLODipine 10 MG tablet  Commonly known as:  NORVASC  Take 10 mg by mouth daily.     aspirin EC 81 MG tablet  Take 81 mg by mouth daily.     atorvastatin 80 MG tablet  Commonly known as:  LIPITOR  Take 80 mg by mouth at bedtime.     betamethasone dipropionate 0.05 % ointment  Commonly known as:  DIPROLENE  Apply 1 application topically 3 (three) times daily. Applies to forehead and scalp     clopidogrel 75 MG tablet  Commonly known as:  PLAVIX  Take 75 mg by mouth at bedtime.     ezetimibe 10 MG tablet  Commonly known as:  ZETIA  Take 10 mg by mouth at bedtime.     glipiZIDE 2.5 MG 24 hr tablet  Commonly known as:  GLUCOTROL XL  Take 2.5 mg by mouth daily as needed (glucose levels).     isosorbide mononitrate 30 MG 24 hr tablet  Commonly known as:  IMDUR  Take 30 mg by mouth daily.     lisinopril 20 MG tablet  Commonly known as:  PRINIVIL,ZESTRIL  Take 1 tablet (20 mg total) by mouth daily.     metoprolol succinate 50 MG 24 hr tablet  Commonly known as:  TOPROL-XL  Take 1 tablet (50 mg total) by mouth 2 (two) times daily. Take with or immediately following a meal.     multivitamin with minerals Tabs tablet  Take 1 tablet by mouth at bedtime.     nitroGLYCERIN 0.4 MG SL tablet  Commonly known as:  NITROSTAT  Place 1 tablet (0.4 mg total) under the tongue every 5 (five) minutes x 3 doses as needed for chest pain.     pantoprazole 40 MG tablet  Commonly known as:  PROTONIX  Take 1 tablet (40 mg total) by mouth daily.       Follow-up Information   Follow up with [facility]. (They will call you and arrange classes to help you with your diabetes.)       Follow up with Thompson Grayer, MD. (as before)    Specialty:  Cardiology   Contact information:   [place] [address] [phone]       Follow up with Jenkins Rouge, MD. (Keep your appt in March)    Specialty:  Cardiology    Contact information:   [address] [address] [phone]       Follow up with Bronson Curb, PA-C On 06/07/2013. (at 1:00 pm)    Specialty:  Physician Assistant   Contact information:   [address] [place] [address] [phone]        Discharge Instructions: Watch your diet.  Take your diabetic medicine.  We have arranged classes for you to attend to help your manage your diabetes better.  Call if any cardiac problems to [phone]  Call [facility] if any bleeding, swelling or drainage at cath site.  May shower, no tub baths for 48 hours for groin sticks.    Signed: Isaiah Serge Nurse Practitioner-Certified [facility]: [facility] 05/05/2013, 12:41 PM  Time spent on discharge : 40 minutes.

## 2013-05-05 NOTE — Progress Notes (Signed)
  RD consulted for nutrition education regarding diabetes.   Lab Results  Component Value Date   HGBA1C 6.3* 05/02/2013    RD provided "Carbohydrate Counting for People with Diabetes" handout from the Academy of Nutrition and Dietetics. Discussed different food groups and their effects on blood sugar, emphasizing carbohydrate-containing foods. Provided list of carbohydrates and recommended serving sizes of common foods.  Discussed importance of controlled and consistent carbohydrate intake throughout the day. Provided examples of ways to balance meals/snacks and encouraged intake of high-fiber, whole grain complex carbohydrates. Encouraged healthful eating: lean protein, low fat dairy, whole grains, healthy fats, and daily intake of fruits and vegetables Teach back method used. Pt reports drinking sweet tea PTA and pt states she will switch to un-sweet tea. Pt states her blood sugar was likely elevated due to using a lot of syrup PTA.   Expect good compliance.  Body mass index is 32.88 kg/(m^2). Pt meets criteria for Obesity based on current BMI.  Current diet order is Carb Modified, patient is consuming approximately 75-100% of meals at this time. Labs and medications reviewed. No further nutrition interventions warranted at this time. RD contact information provided. If additional nutrition issues arise, please re-consult RD.  Pryor Ochoa RD, LDN Inpatient Clinical Dietitian Pager: [phone] After Hours Pager: [phone]

## 2013-05-06 ENCOUNTER — Encounter: Payer: Medicare Other | Admitting: *Deleted

## 2013-05-16 ENCOUNTER — Emergency Department (HOSPITAL_COMMUNITY)
Admission: EM | Admit: 2013-05-16 | Discharge: 2013-05-16 | Disposition: A | Discharge status: Home / Self Care | Payer: Medicare Other | Attending: Emergency Medicine | Admitting: Emergency Medicine

## 2013-05-16 ENCOUNTER — Encounter (HOSPITAL_COMMUNITY): Payer: Self-pay | Admitting: Emergency Medicine

## 2013-05-16 ENCOUNTER — Emergency Department (HOSPITAL_COMMUNITY): Payer: Medicare Other

## 2013-05-16 DIAGNOSIS — N183 Chronic kidney disease, stage 3 unspecified: Secondary | ICD-10-CM | POA: Insufficient documentation

## 2013-05-16 DIAGNOSIS — E785 Hyperlipidemia, unspecified: Secondary | ICD-10-CM | POA: Insufficient documentation

## 2013-05-16 DIAGNOSIS — I129 Hypertensive chronic kidney disease with stage 1 through stage 4 chronic kidney disease, or unspecified chronic kidney disease: Secondary | ICD-10-CM | POA: Insufficient documentation

## 2013-05-16 DIAGNOSIS — IMO0002 Reserved for concepts with insufficient information to code with codable children: Secondary | ICD-10-CM | POA: Insufficient documentation

## 2013-05-16 DIAGNOSIS — Z7982 Long term (current) use of aspirin: Secondary | ICD-10-CM | POA: Insufficient documentation

## 2013-05-16 DIAGNOSIS — Z87898 Personal history of other specified conditions: Secondary | ICD-10-CM | POA: Insufficient documentation

## 2013-05-16 DIAGNOSIS — Z9861 Coronary angioplasty status: Secondary | ICD-10-CM | POA: Insufficient documentation

## 2013-05-16 DIAGNOSIS — F17200 Nicotine dependence, unspecified, uncomplicated: Secondary | ICD-10-CM | POA: Insufficient documentation

## 2013-05-16 DIAGNOSIS — Z8739 Personal history of other diseases of the musculoskeletal system and connective tissue: Secondary | ICD-10-CM | POA: Insufficient documentation

## 2013-05-16 DIAGNOSIS — Z95810 Presence of automatic (implantable) cardiac defibrillator: Secondary | ICD-10-CM | POA: Insufficient documentation

## 2013-05-16 DIAGNOSIS — E119 Type 2 diabetes mellitus without complications: Secondary | ICD-10-CM | POA: Insufficient documentation

## 2013-05-16 DIAGNOSIS — Z79899 Other long term (current) drug therapy: Secondary | ICD-10-CM | POA: Insufficient documentation

## 2013-05-16 DIAGNOSIS — I509 Heart failure, unspecified: Secondary | ICD-10-CM | POA: Insufficient documentation

## 2013-05-16 DIAGNOSIS — I252 Old myocardial infarction: Secondary | ICD-10-CM | POA: Insufficient documentation

## 2013-05-16 DIAGNOSIS — Z7902 Long term (current) use of antithrombotics/antiplatelets: Secondary | ICD-10-CM | POA: Insufficient documentation

## 2013-05-16 DIAGNOSIS — Z8673 Personal history of transient ischemic attack (TIA), and cerebral infarction without residual deficits: Secondary | ICD-10-CM | POA: Insufficient documentation

## 2013-05-16 DIAGNOSIS — R079 Chest pain, unspecified: Secondary | ICD-10-CM | POA: Insufficient documentation

## 2013-05-16 LAB — COMPREHENSIVE METABOLIC PANEL
ALBUMIN: 4 g/dL (ref 3.5–5.2)
ALT: 33 U/L (ref 0–35)
AST: 34 U/L (ref 0–37)
Alkaline Phosphatase: 48 U/L (ref 39–117)
BUN: 26 mg/dL — AB (ref 6–23)
CO2: 19 mEq/L (ref 19–32)
CREATININE: 1.49 mg/dL — AB (ref 0.50–1.10)
Calcium: 9.8 mg/dL (ref 8.4–10.5)
Chloride: 103 mEq/L (ref 96–112)
GFR calc Af Amer: 43 mL/min — ABNORMAL LOW (ref >90)
GFR calc non Af Amer: 37 mL/min — ABNORMAL LOW (ref >90)
Glucose, Bld: 136 mg/dL — ABNORMAL HIGH (ref 70–99)
Potassium: 3.8 mEq/L (ref 3.7–5.3)
Sodium: 143 mEq/L (ref 137–147)
TOTAL PROTEIN: 7.5 g/dL (ref 6.0–8.3)
Total Bilirubin: 0.5 mg/dL (ref 0.3–1.2)

## 2013-05-16 LAB — CBC
HEMATOCRIT: 38.5 % (ref 36.0–46.0)
Hemoglobin: 13.3 g/dL (ref 12.0–15.0)
MCH: 32.4 pg (ref 26.0–34.0)
MCHC: 34.5 g/dL (ref 30.0–36.0)
MCV: 93.9 fL (ref 78.0–100.0)
Platelets: 176 10*3/uL (ref 150–400)
RBC: 4.1 MIL/uL (ref 3.87–5.11)
RDW: 15.8 % — AB (ref 11.5–15.5)
WBC: 8.3 10*3/uL (ref 4.0–10.5)

## 2013-05-16 LAB — CBG MONITORING, ED: GLUCOSE-CAPILLARY: 116 mg/dL — AB (ref 70–99)

## 2013-05-16 LAB — TROPONIN I: Troponin I: <0.3 ng/mL (ref <0.30)

## 2013-05-16 MED ORDER — SODIUM CHLORIDE 0.9 % IV SOLN
INTRAVENOUS | Status: DC
  Administered 2013-05-16: 1000 mL via INTRAVENOUS

## 2013-05-16 MED ORDER — FAMOTIDINE 20 MG PO TABS
20.0000 mg | ORAL_TABLET | Freq: Once | ORAL | Status: AC
  Administered 2013-05-16: 20 mg via ORAL
  Filled 2013-05-16: qty 1

## 2013-05-16 MED ORDER — GI COCKTAIL ~~LOC~~
30.0000 mL | Freq: Once | ORAL | Status: AC
  Administered 2013-05-16: 30 mL via ORAL
  Filled 2013-05-16: qty 30

## 2013-05-16 NOTE — ED Provider Notes (Signed)
CSN: [id number]     Arrival date & time 05/16/13  1656 History   First MD Initiated Contact with Patient 05/16/13 1705     Chief Complaint  Patient presents with  . Chest Pain     (Consider location/radiation/quality/duration/timing/severity/associated sxs/prior Treatment) Patient is a 63 y.o. female presenting with chest pain. The history is provided by the patient.  Chest Pain Associated symptoms: no abdominal pain, no back pain, no fever, no headache, no nausea, no palpitations, no shortness of breath and not vomiting   pt with hx cad/stent, presents w c/o mid to lower sternal cp onset earlier today, approximately noon today. Constant. Dull. Non radiating. No associated nv, diaphoresis or sob. Not pleuritic. At rest. No relation to activity or exertion. Pt states hx cad and gerd, unsure if same as either. No other recent cp or discomfort since d/c from hospital w same 1-2 weeks ago. Pt states had repeat cath then w no new blockages, no need for angioplasty or stent. Pt denies cough or uri c/o. No leg pain or swelling.      Past Medical History  Diagnosis Date  . Heart disease   . History of stroke 89  . Myocardial infarction   . Lymphoma   . CHF (congestive heart failure)   . HTN (hypertension)   . HLD (hyperlipidemia)   . Kidney disease   . Ventricular tachycardia     CL 300 msec requiring ICD shocks therpay 5/14  . DM2 (diabetes mellitus, type 2), newly diagnosed 05/05/2013  . CKD (chronic kidney disease) stage 3, GFR 30-59 ml/min 05/05/2013  . Polymyalgia rheumatica    Past Surgical History  Procedure Laterality Date  . Cardiac defibrillator placement  03/2011    MDT ICD implanted at [facility] by Dr Tana Coast  . Coronary angioplasty with stent placement     Family History  Problem Relation Age of Onset  . Diabetes Father   . Hypertension Father   . Heart disease Father   . Alcoholism Father   . Hypertension Mother   . Heart disease Mother   . Breast cancer Mother   .  Diabetes Brother   . Diabetes Brother   . Diabetes Brother   . Diabetes Brother   . Hypertension Sister   . Heart disease Sister   . Alcoholism Sister   . Thyroid disease Sister    History  Substance Use Topics  . Smoking status: Current Some Day Smoker -- 0.25 packs/day  . Smokeless tobacco: Not on file     Comment: she is not ready to quit  . Alcohol Use: No   OB History   Grav Para Term Preterm Abortions TAB SAB Ect Mult Living                 Review of Systems  Constitutional: Negative for fever and chills.  HENT: Negative for sore throat.   Eyes: Negative for redness.  Respiratory: Negative for shortness of breath.   Cardiovascular: Positive for chest pain. Negative for palpitations and leg swelling.  Gastrointestinal: Negative for nausea, vomiting and abdominal pain.  Genitourinary: Negative for flank pain.  Musculoskeletal: Negative for back pain and neck pain.  Skin: Negative for rash.  Neurological: Negative for headaches.  Hematological: Does not bruise/bleed easily.  Psychiatric/Behavioral: Negative for confusion.      Allergies  Potassium-containing compounds; Contrast media; and Flagyl  Home Medications   Current Outpatient Rx  Name  Route  Sig  Dispense  Refill  . acetaminophen (TYLENOL)  325 MG tablet   Oral   Take 2 tablets (650 mg total) by mouth every 4 (four) hours as needed for headache or mild pain.         Marland Kitchen amLODipine (NORVASC) 10 MG tablet   Oral   Take 10 mg by mouth daily.         Marland Kitchen aspirin EC 81 MG tablet   Oral   Take 81 mg by mouth daily.         Marland Kitchen atorvastatin (LIPITOR) 80 MG tablet   Oral   Take 80 mg by mouth at bedtime.         . betamethasone dipropionate (DIPROLENE) 0.05 % ointment   Topical   Apply 1 application topically 3 (three) times daily. Applies to forehead and scalp         . clopidogrel (PLAVIX) 75 MG tablet   Oral   Take 75 mg by mouth at bedtime.          Marland Kitchen ezetimibe (ZETIA) 10 MG tablet    Oral   Take 10 mg by mouth at bedtime.          Marland Kitchen glipiZIDE (GLUCOTROL XL) 2.5 MG 24 hr tablet   Oral   Take 2.5 mg by mouth daily as needed (glucose levels).         . isosorbide mononitrate (IMDUR) 30 MG 24 hr tablet   Oral   Take 30 mg by mouth daily.         Marland Kitchen lisinopril (PRINIVIL,ZESTRIL) 20 MG tablet   Oral   Take 1 tablet (20 mg total) by mouth daily.   30 tablet   6   . metoprolol succinate (TOPROL-XL) 50 MG 24 hr tablet   Oral   Take 1 tablet (50 mg total) by mouth 2 (two) times daily. Take with or immediately following a meal.   60 tablet   5     PT WILL BE TAKING 75 MG DAILY   . Multiple Vitamin (MULTIVITAMIN WITH MINERALS) TABS   Oral   Take 1 tablet by mouth at bedtime.          . nitroGLYCERIN (NITROSTAT) 0.4 MG SL tablet   Sublingual   Place 1 tablet (0.4 mg total) under the tongue every 5 (five) minutes x 3 doses as needed for chest pain.   25 tablet   4   . pantoprazole (PROTONIX) 40 MG tablet   Oral   Take 1 tablet (40 mg total) by mouth daily.   30 tablet   4    BP 156/93  Pulse 61  Temp(Src) 98.3 F (36.8 C) (Oral)  Resp 11  SpO2 100% Physical Exam  Nursing note and vitals reviewed. Constitutional: She is oriented to person, place, and time. She appears well-developed and well-nourished. No distress.  HENT:  Mouth/Throat: Oropharynx is clear and moist.  Eyes: Conjunctivae are normal. No scleral icterus.  Neck: Neck supple. No tracheal deviation present.  Cardiovascular: Normal rate, regular rhythm, normal heart sounds and intact distal pulses.   Pulmonary/Chest: Effort normal and breath sounds normal. No respiratory distress. She exhibits no tenderness.  Abdominal: Soft. Normal appearance and bowel sounds are normal. She exhibits no distension. There is no tenderness.  Musculoskeletal: She exhibits no edema and no tenderness.  Neurological: She is alert and oriented to person, place, and time.  Skin: Skin is warm and dry. No rash  noted. She is not diaphoretic.  Psychiatric: She has a normal mood and affect.  ED Course  Procedures (including critical care time)   Results for orders placed during the hospital encounter of 05/16/13  CBC      Result Value Ref Range   WBC 8.3  4.0 - 10.5 K/uL   RBC 4.10  3.87 - 5.11 MIL/uL   Hemoglobin 13.3  12.0 - 15.0 g/dL   HCT 38.5  36.0 - 46.0 %   MCV 93.9  78.0 - 100.0 fL   MCH 32.4  26.0 - 34.0 pg   MCHC 34.5  30.0 - 36.0 g/dL   RDW 15.8 (*) 11.5 - 15.5 %   Platelets 176  150 - 400 K/uL  TROPONIN I      Result Value Ref Range   Troponin I <0.30  <0.30 ng/mL  CBG MONITORING, ED      Result Value Ref Range   Glucose-Capillary 116 (*) 70 - 99 mg/dL   Comment 1 Notify RN     Comment 2 Documented in Chart     Dg Chest 2 View  05/16/2013   CLINICAL DATA:  Central chest pain, history heart disease, MI, stroke, hypertension, diabetes, lymphoma  EXAM: CHEST  2 VIEW  COMPARISON:  05/02/2013  FINDINGS: Left subclavian pacemaker/AICD leads project at right atrium and right ventricle.  Enlargement of cardiac silhouette.  Calcified tortuous aorta.  Pulmonary vascularity mediastinal contours normal.  Lungs clear.  No pleural effusion or pneumothorax.  Bones unremarkable.  IMPRESSION: Enlargement of cardiac silhouette post pacemaker/AICD.  No acute abnormalities.   Electronically Signed   By: Lavonia Dana M.D.   On: 05/16/2013 19:53   Dg Chest 2 View  05/02/2013   CLINICAL DATA:  Chest pain.  EXAM: CHEST  2 VIEW  COMPARISON:  07/19/2012  FINDINGS: Pacemaker/ AICD appears unchanged. The heart is mildly enlarged. The aorta is unfolded. Lungs are clear. The vascularity is normal. No effusions. No acute bony finding.  IMPRESSION: No active disease.  Mild cardiomegaly.  Pacemaker/AICD.   Electronically Signed   By: Nelson Chimes M.D.   On: 05/02/2013 13:52       Date: 05/16/2013  Rate: 61  Rhythm: normal sinus rhythm  QRS Axis: normal  Intervals: normal  ST/T Wave abnormalities:  nonspecific T wave changes  Conduction Disutrbances:none  Narrative Interpretation:   Old EKG Reviewed: unchanged ` t inv v3-v5 noted on prior ecg  MDM  Iv ns. Labs. Cxr.  Reviewed nursing notes and prior charts for additional history.   Hx gerd, will try pepcid and gi cocktail for symptom relief.  Reviewed recent cath - no angioplasty/stent- card was recommending medical management, and suggesting in their notes that pts current/recent cp may not be cardiac in etiology, possibly gi related.  At 8 pm, second/repeat troponin remains pending.  Signed out to NP Schulz/MD Rancour, that 2nd troponin pending, if normal/not increasing and no new symptoms, may d/c patient w close follow up with her cardiologist.        Mirna Mires, MD 05/16/13 2004

## 2013-05-16 NOTE — ED Notes (Signed)
Daughter Ranelle Oyster at [facility] called to talk to pt. Pt asleep. Will relay msg when she wakes up. [phone]

## 2013-05-16 NOTE — ED Notes (Signed)
Patient transported to X-ray 

## 2013-05-16 NOTE — Discharge Instructions (Signed)
Chest Pain (Nonspecific) Chest pain has many causes. Your pain could be caused by something serious, such as a heart attack or a blood clot in the lungs. It could also be caused by something less serious, such as a chest bruise or a virus. Follow up with your doctor. More lab tests or other studies may be needed to find the cause of your pain. Most of the time, nonspecific chest pain will improve within 2 to 3 days of rest and mild pain medicine. HOME CARE  For chest bruises, you may put ice on the sore area for 15-20 minutes, 03-04 times a day. Do this only if it makes you feel better.  Put ice in a plastic bag.  Place a towel between the skin and the bag.  Rest for the next 2 to 3 days.  Go back to work if the pain improves.  See your doctor if the pain lasts longer than 1 to 2 weeks.  Only take medicine as told by your doctor.  Quit smoking if you smoke. GET HELP RIGHT AWAY IF:   There is more pain or pain that spreads to the arm, neck, jaw, back, or belly (abdomen).  You have shortness of breath.  You cough more than usual or cough up blood.  You have very bad back or belly pain, feel sick to your stomach (nauseous), or throw up (vomit).  You have very bad weakness.  You pass out (faint).  You have a fever. Any of these problems may be serious and may be an emergency. Do not wait to see if the problems will go away. Get medical help right away. Call your local emergency services 911 in [place] Do not drive yourself to the hospital. MAKE SURE YOU:   Understand these instructions.  Will watch this condition.  Will get help right away if you or your child is not doing well or gets worse. Document Released: 08/28/2007 Document Revised: 06/03/2011 Document Reviewed: 08/28/2007 [facility] Patient Information 2014 [place], [place]. Today your chest pain, was evaluated, you had 2 sets of negative cardiac markers, and a normal EKG, it is recommended that, you followup with your  cardiologist by phone tomorrow morning to set an appointment for evaluation

## 2013-05-16 NOTE — ED Provider Notes (Signed)
Second troponin is negative.  Patient is being discharged him with the understanding that she will followup with her cardiologist by phone in the morning to set an appointment for further evaluation per his instructions  Garald Balding, NP 05/16/13 2140  Garald Balding, NP 05/16/13 2140

## 2013-05-16 NOTE — ED Notes (Addendum)
Pt from home. Was visiting daughter at [facility] hospital. Just had a cardiac cath performed on 05/03/13. Started having midsternal chest pressure/pain. Called EMS. Pt denies radiating pain, nausea, vomiting, dizziness. EMS administered 2 Nitroglycerin tablets SL and 324 ASA. Pt denied CP post medication administration. Pt has an on demand pacemaker, as well.

## 2013-05-16 NOTE — ED Notes (Signed)
Spoke with lab regarding delay in lab results. Lab tech stated that machine had to be "QC'd" and they are running lab tests now.

## 2013-05-17 NOTE — ED Provider Notes (Signed)
Medical screening examination/treatment/procedure(s) were conducted as a shared visit with non-physician practitioner(s) and myself.  I personally evaluated the patient during the encounter.  EKG Interpretation   None       See H and P by me.   Mirna Mires, MD 05/17/13 1154

## 2013-05-31 DIAGNOSIS — I509 Heart failure, unspecified: Secondary | ICD-10-CM

## 2013-06-02 ENCOUNTER — Other Ambulatory Visit: Payer: Self-pay | Admitting: *Deleted

## 2013-06-02 MED ORDER — METOPROLOL SUCCINATE ER 50 MG PO TB24: 50.0000 mg | ORAL_TABLET | Freq: Two times a day (BID) | ORAL | Status: DC

## 2013-06-08 ENCOUNTER — Other Ambulatory Visit: Payer: Self-pay | Admitting: *Deleted

## 2013-06-09 ENCOUNTER — Ambulatory Visit (INDEPENDENT_AMBULATORY_CARE_PROVIDER_SITE_OTHER): Payer: Medicare Other | Admitting: Cardiovascular Disease

## 2013-06-09 ENCOUNTER — Encounter: Payer: Self-pay | Admitting: Cardiovascular Disease

## 2013-06-09 VITALS — BP 135/83 | HR 64 | Ht 65.0 in | Wt 199.1 lb

## 2013-06-09 DIAGNOSIS — E785 Hyperlipidemia, unspecified: Secondary | ICD-10-CM

## 2013-06-09 DIAGNOSIS — Z79899 Other long term (current) drug therapy: Secondary | ICD-10-CM

## 2013-06-09 DIAGNOSIS — I2510 Atherosclerotic heart disease of native coronary artery without angina pectoris: Secondary | ICD-10-CM

## 2013-06-09 DIAGNOSIS — Z95810 Presence of automatic (implantable) cardiac defibrillator: Secondary | ICD-10-CM

## 2013-06-09 DIAGNOSIS — E119 Type 2 diabetes mellitus without complications: Secondary | ICD-10-CM

## 2013-06-09 DIAGNOSIS — I10 Essential (primary) hypertension: Secondary | ICD-10-CM

## 2013-06-09 LAB — BASIC METABOLIC PANEL
BUN: 29 mg/dL — ABNORMAL HIGH (ref 6–23)
CALCIUM: 9.8 mg/dL (ref 8.4–10.5)
CO2: 30 mEq/L (ref 19–32)
CREATININE: 1.7 mg/dL — AB (ref 0.4–1.2)
Chloride: 102 mEq/L (ref 96–112)
GFR: 38.17 mL/min — AB (ref >60.00)
Glucose, Bld: 121 mg/dL — ABNORMAL HIGH (ref 70–99)
Potassium: 3.8 mEq/L (ref 3.5–5.1)
SODIUM: 139 meq/L (ref 135–145)

## 2013-06-09 NOTE — Addendum Note (Signed)
Addended by: Eulis Foster on: 06/09/2013 02:54 PM   Modules accepted: Orders

## 2013-06-09 NOTE — Patient Instructions (Signed)
Your physician wants you to follow-up in:   6 MONTHS  WITH  DR NISHAN  You will receive a reminder letter in the mail two months in advance. If you don't receive a letter, please call our office to schedule the follow-up appointment.  Your physician recommends that you continue on your current medications as directed. Please refer to the Current Medication list given to you today.   Your physician recommends that you return for lab work in: TODAY  BMET 

## 2013-06-09 NOTE — Assessment & Plan Note (Signed)
No d/c  Stable f/u Dr Rayann Heman

## 2013-06-09 NOTE — Assessment & Plan Note (Signed)
Cholesterol is at goal.  Continue current dose of statin and diet Rx.  No myalgias or side effects.  F/U  LFT's in 6 months. Lab Results  Component Value Date   LDLCALC 56 05/03/2013

## 2013-06-09 NOTE — Assessment & Plan Note (Signed)
Stable with no angina and good activity level.  Continue medical Rx Recent cath with no critical stenosis

## 2013-06-09 NOTE — Assessment & Plan Note (Signed)
Discussed low carb diet.  Target hemoglobin A1c is 6.5 or less.  Continue current medications.  

## 2013-06-09 NOTE — Progress Notes (Signed)
Patient ID: [name] Terry, female   DOB: Aug 08, 1950, 63 y.o.   MRN: [id number] 63 yo seen by Dr Alroy Dust in [place], no records though hx of PCI/stents in [place] Also sees [place] for primary care in [place] . D/C from [facility] on 05/02/13 after cath and further w/u   She has poor insight into her history and no records available from cardiology. Has had issues for 2-3 years Describes 7 heart attacks and what sounds like ischemic DCM. Last stent ? A year or two ago Intervention done in [place]. She got an AICD in [place] at [facility] with Dr Tana Coast . Indicates that device went off in May F/U with Dr Rayann Heman and device fired appropriately along with some ATP pacing. He also adjusted her settings to minimize shock therapy by adding a VT zone, increasing ATP attempts, and increasing NIDs. Toprol was increased to 75 bid. Recently saw primary and started on glipizide. However she does not take it regularly. BS over 400 on day of admit and she took it. Was 104 in ER. 05/02/13 after breakfast had multiple episodes of SSCP radiating to shoulders and back. She does not have nitro at home Relief with nitro by EMS Currently pain free. Echo ordered by Dr Rayann Heman 12/30 with EF 25-30% No signs of CHF or dyspnea on admit or discharge. Optivol was ok on AICD.  She was admitted and no MI with negative Troponin. She underwent cardiac cath:  Hemodynamic Findings:  Ao:114/63  LV: 115/1/5  RA: 2  RV: 21/0/4  PA: 19/4 (mean 11)  PCWP: 9  Fick Cardiac Output: 4.97 L/min  Fick Cardiac Index: 2.54 L/min/m2  Central Aortic Saturation: 95%  Pulmonary Artery Saturation: 66%  Angiographic Findings:  Left main: No obstructive disease.  Left Anterior Descending Artery: Large caliber vessel that courses to the apex. There is diffuse 30% stenosis in the proximal, mid and distal vessel. The first diagonal branch is moderate in caliber with ostial 30% stenosis.  Circumflex Artery: Moderate caliber vessel with diffuse 50% stenosis in the  proximal, mid and distal vessel. There are three patent OM branches. The first OM branch is moderate in caliber with 30% proximal stenosis. The second OM branch is small in caliber with proximal 60% stenosis. The third OM branch is moderate in caliber with 30% proximal stenosis followed by diffuse 90% stenosis distally where the vessel becomes very small in caliber.  Right Coronary Artery: Large caliber dominant vessel with patent mid stent without restenosis. Mild plaque in proximal and distal vessel.  Left Ventricular Angiogram: Deferred.   Medical Rx recommended  Since hospital d/c has been doing much better with BS  Averaging around 90  Has book and checks it daily  Sees primary in [place] for adjustments in meds  Daughter has twin girls and she is staying in [place] for the time being Has another daughter in [place]       ROS: Denies fever, malais, weight loss, blurry vision, decreased visual acuity, cough, sputum, SOB, hemoptysis, pleuritic pain, palpitaitons, heartburn, abdominal pain, melena, lower extremity edema, claudication, or rash.  All other systems reviewed and negative  General: Affect appropriate Healthy:  appears stated age 63: normal Neck supple with no adenopathy JVP normal no bruits no thyromegaly Lungs clear with no wheezing and good diaphragmatic motion Heart:  S1/S2 no murmur, no rub, gallop or click PMI normal Abdomen: benighn, BS positve, no tenderness, no AAA no bruit.  No HSM or HJR Distal pulses intact with no bruits No edema  Neuro non-focal Skin warm and dry No muscular weakness   Current Outpatient Prescriptions  Medication Sig Dispense Refill  . amLODipine (NORVASC) 10 MG tablet Take 10 mg by mouth daily.      Marland Kitchen aspirin EC 81 MG tablet Take 81 mg by mouth daily.      Marland Kitchen atorvastatin (LIPITOR) 80 MG tablet Take 80 mg by mouth at bedtime.      . betamethasone dipropionate (DIPROLENE) 0.05 % ointment Apply 1 application topically 3 (three) times  daily as needed (dermatitis). Applies to forehead and scalp      . clopidogrel (PLAVIX) 75 MG tablet Take 75 mg by mouth at bedtime.       Marland Kitchen ezetimibe (ZETIA) 10 MG tablet Take 10 mg by mouth at bedtime.       Marland Kitchen glipiZIDE (GLUCOTROL XL) 2.5 MG 24 hr tablet Take 2.5 mg by mouth every morning.       . isosorbide mononitrate (IMDUR) 30 MG 24 hr tablet Take 30 mg by mouth daily.      Marland Kitchen lisinopril-hydrochlorothiazide (PRINZIDE,ZESTORETIC) 20-12.5 MG per tablet Take 1 tablet by mouth daily.      . metoprolol succinate (TOPROL-XL) 50 MG 24 hr tablet Take 1 tablet (50 mg total) by mouth 2 (two) times daily. Take with or immediately following a meal.  180 tablet  0  . Multiple Vitamin (MULTIVITAMIN WITH MINERALS) TABS Take 1 tablet by mouth at bedtime.       . nitroGLYCERIN (NITROSTAT) 0.4 MG SL tablet Place 1 tablet (0.4 mg total) under the tongue every 5 (five) minutes x 3 doses as needed for chest pain.  25 tablet  4  . pantoprazole (PROTONIX) 40 MG tablet Take 1 tablet (40 mg total) by mouth daily.  30 tablet  4  . Potassium Chloride (KLOR-CON PO) Take 12.5 mg by mouth daily.       No current facility-administered medications for this visit.    Allergies  Potassium-containing compounds; Contrast media; and Flagyl  Electrocardiogram:  2/22  SR PAC poor R wave progression   Assessment and Plan

## 2013-06-09 NOTE — Assessment & Plan Note (Signed)
Well controlled.  Continue current medications and low sodium Dash type diet.    

## 2013-06-10 ENCOUNTER — Encounter: Payer: Medicare Other | Admitting: *Deleted

## 2013-06-21 ENCOUNTER — Encounter: Payer: Self-pay | Admitting: *Deleted

## 2013-06-23 ENCOUNTER — Telehealth: Payer: Self-pay | Admitting: *Deleted

## 2013-06-23 ENCOUNTER — Encounter: Payer: Self-pay | Admitting: *Deleted

## 2013-06-23 NOTE — Telephone Encounter (Signed)
Recorded by Richmond Campbell, LPN on 8/38/1840 at 3:75 PM LMTCB ./CY Notes Recorded by Richmond Campbell, LPN on 4/36/0677 at 03:40 PM LMTCB ./CY Notes Recorded by Emmaline Life, RN on 06/10/2013 at 4:38 PM Left message for patient to call office for results Notes Recorded by Josue Hector, MD on 06/09/2013 at 8:26 PM K ok F/u with primary regarding chronic renal dysfunction LMTCB   ONCE AGAIN  WILL  SEND  RESULTS LETTER TO PT .Adonis Housekeeper

## 2013-07-05 ENCOUNTER — Encounter: Payer: Self-pay | Admitting: *Deleted

## 2013-07-05 ENCOUNTER — Ambulatory Visit (INDEPENDENT_AMBULATORY_CARE_PROVIDER_SITE_OTHER): Payer: Medicare Other | Admitting: *Deleted

## 2013-07-05 DIAGNOSIS — Z95810 Presence of automatic (implantable) cardiac defibrillator: Secondary | ICD-10-CM

## 2013-07-05 DIAGNOSIS — I509 Heart failure, unspecified: Secondary | ICD-10-CM

## 2013-07-05 NOTE — Progress Notes (Signed)
EPIC Encounter for ICM Monitoring  Patient Name: [name] Terry is a 63 y.o. female Date: 07/05/2013 Primary Care Physican: Mackey Birchwood Primary Cardiologist: Johnsie Cancel Electrophysiologist: Allred Dry Weight: 198 lbs      In the past month, have you:  1. Gained more than 2 pounds in a day or more than 5 pounds in a week? no  2. Had changes in your medications (with verification of current medications)? Yes. The patient's lisinopril-HCTZ was stopped by renal and she was started on lasix 20 mg once daily. She is also taking plain lisinopril 20 mg once daily (verified with [facility]).  3. Had more shortness of breath than is usual for you? no  4. Limited your activity because of shortness of breath? no  5. Not been able to sleep because of shortness of breath? no  6. Had increased swelling in your feet or ankles? no  7. Had symptoms of dehydration (dizziness, dry mouth, increased thirst, decreased urine output) no  8. Had changes in sodium restriction? no  9. Been compliant with medication? Yes   ICM trend:   Follow-up plan: ICM clinic phone appointment: 08/05/13  Copy of note sent to patient's primary care physician, primary cardiologist, and device following physician.  Emily Filbert, RN, BSN 07/05/2013 5:13 PM

## 2013-07-20 ENCOUNTER — Ambulatory Visit: Payer: Medicare Other | Admitting: *Deleted

## 2013-07-21 ENCOUNTER — Ambulatory Visit (INDEPENDENT_AMBULATORY_CARE_PROVIDER_SITE_OTHER): Payer: Medicare Other | Admitting: *Deleted

## 2013-07-21 DIAGNOSIS — I428 Other cardiomyopathies: Secondary | ICD-10-CM

## 2013-07-21 LAB — MDC_IDC_ENUM_SESS_TYPE_REMOTE
Brady Statistic AP VP Percent: 0.03 %
Brady Statistic AS VS Percent: 40.59 %
Brady Statistic RA Percent Paced: 59.39 %
Brady Statistic RV Percent Paced: 0.04 %
HIGH POWER IMPEDANCE MEASURED VALUE: 171 Ohm
HighPow Impedance: 342 Ohm
HighPow Impedance: 59 Ohm
Lead Channel Pacing Threshold Amplitude: 0.75 V
Lead Channel Pacing Threshold Pulse Width: 0.4 ms
Lead Channel Setting Pacing Amplitude: 2 V
Lead Channel Setting Pacing Pulse Width: 0.4 ms
MDC IDC MSMT BATTERY VOLTAGE: 3.1 V
MDC IDC MSMT LEADCHNL RA IMPEDANCE VALUE: 475 Ohm
MDC IDC MSMT LEADCHNL RA PACING THRESHOLD PULSEWIDTH: 0.4 ms
MDC IDC MSMT LEADCHNL RA SENSING INTR AMPL: 3.125 mV
MDC IDC MSMT LEADCHNL RV IMPEDANCE VALUE: 399 Ohm
MDC IDC MSMT LEADCHNL RV PACING THRESHOLD AMPLITUDE: 0.75 V
MDC IDC MSMT LEADCHNL RV SENSING INTR AMPL: 4.625 mV
MDC IDC SESS DTM: 20150429190718
MDC IDC SET LEADCHNL RV PACING AMPLITUDE: 2.5 V
MDC IDC SET LEADCHNL RV SENSING SENSITIVITY: 0.3 mV
MDC IDC SET ZONE DETECTION INTERVAL: 270 ms
MDC IDC SET ZONE DETECTION INTERVAL: 280 ms
MDC IDC SET ZONE DETECTION INTERVAL: 350 ms
MDC IDC STAT BRADY AP VS PERCENT: 59.36 %
MDC IDC STAT BRADY AS VP PERCENT: 0.02 %
Zone Setting Detection Interval: 340 ms
Zone Setting Detection Interval: 370 ms

## 2013-07-28 ENCOUNTER — Telehealth: Payer: Self-pay | Admitting: Internal Medicine

## 2013-07-28 NOTE — Telephone Encounter (Signed)
LMOVM stating we did receive her transmission.

## 2013-07-28 NOTE — Telephone Encounter (Signed)
New message     Did we get her remote transmission from last week?

## 2013-08-05 ENCOUNTER — Encounter: Payer: Self-pay | Admitting: *Deleted

## 2013-08-05 ENCOUNTER — Telehealth: Payer: Self-pay | Admitting: *Deleted

## 2013-08-05 ENCOUNTER — Ambulatory Visit (INDEPENDENT_AMBULATORY_CARE_PROVIDER_SITE_OTHER): Payer: Medicare Other | Admitting: *Deleted

## 2013-08-05 DIAGNOSIS — Z95810 Presence of automatic (implantable) cardiac defibrillator: Secondary | ICD-10-CM

## 2013-08-05 DIAGNOSIS — I509 Heart failure, unspecified: Secondary | ICD-10-CM

## 2013-08-05 NOTE — Progress Notes (Signed)
EPIC Encounter for ICM Monitoring  Patient Name: [name] Terry is a 63 y.o. female Date: 08/05/2013 Primary Care Physican: Mackey Birchwood Primary Cardiologist: Johnsie Cancel Electrophysiologist: Allred Dry Weight: 198 lbs       In the past month, have you:  1. Gained more than 2 pounds in a day or more than 5 pounds in a week? no  2. Had changes in your medications (with verification of current medications)? no  3. Had more shortness of breath than is usual for you? no  4. Limited your activity because of shortness of breath? no  5. Not been able to sleep because of shortness of breath? no  6. Had increased swelling in your feet or ankles? No. The patient states that since her kidney specialist stopped her lisinopril/hctz and switched her lasix, she has noticed that her lower extremities will swell throughout the day, but they are fine in the mornings when she wakes up.  7. Had symptoms of dehydration (dizziness, dry mouth, increased thirst, decreased urine output) no  8. Had changes in sodium restriction? no  9. Been compliant with medication? Yes   ICM trend:   Follow-up plan: ICM clinic phone appointment : 09/06/13. I advised the patient to please call back if her she starts to notice her lower extremity edema is not getting better over night, she develops SOB, or her weights start to trend upward. She is due to follow back up with renal in July.   Copy of note sent to patient's primary care physician, primary cardiologist, and device following physician.  Emily Filbert, RN, BSN 08/05/2013 11:48 AM

## 2013-08-05 NOTE — Telephone Encounter (Signed)
I spoke with the patient. 

## 2013-08-05 NOTE — Telephone Encounter (Signed)
I left a message for the patient to call at her home and cell #'s for ICM follow up.

## 2013-08-06 NOTE — Progress Notes (Signed)
Remote ICD transmission.   

## 2013-08-17 ENCOUNTER — Other Ambulatory Visit: Payer: Self-pay | Admitting: *Deleted

## 2013-09-06 ENCOUNTER — Encounter: Payer: Self-pay | Admitting: *Deleted

## 2013-09-06 ENCOUNTER — Ambulatory Visit (INDEPENDENT_AMBULATORY_CARE_PROVIDER_SITE_OTHER): Payer: Medicare Other | Admitting: *Deleted

## 2013-09-06 DIAGNOSIS — Z95810 Presence of automatic (implantable) cardiac defibrillator: Secondary | ICD-10-CM

## 2013-09-06 DIAGNOSIS — I509 Heart failure, unspecified: Secondary | ICD-10-CM

## 2013-09-06 NOTE — Progress Notes (Signed)
EPIC Encounter for ICM Monitoring  Patient Name: [name] Terry is a 63 y.o. female Date: 09/06/2013 Primary Care Physican: Mackey Birchwood Primary Cardiologist: Johnsie Cancel Electrophysiologist: Allred Dry Weight: 198 lbs       In the past month, have you:  1. Gained more than 2 pounds in a day or more than 5 pounds in a week? no  2. Had changes in your medications (with verification of current medications)? Yes. The patient reported last month that she was having swelling in her lower extremities during the day that improved overnight. She was taken off lisipril/hctz by her renal doctor and placed on lasix. The patient is uncertain of the reason for this. She states that due to her continued swelling in the lower extremities, she stopped her lasix and restarted her lisinopril/hctz once daily on her own. Her edema has improved.  3. Had more shortness of breath than is usual for you? no  4. Limited your activity because of shortness of breath? no  5. Not been able to sleep because of shortness of breath? no  6. Had increased swelling in your feet or ankles? No. See above.  7. Had symptoms of dehydration (dizziness, dry mouth, increased thirst, decreased urine output) no  8. Had changes in sodium restriction? no  9. Been compliant with medication? Yes   ICM trend:   Follow-up plan: ICM clinic phone appointment: 10/25/13. The patient will see Dr. Rayann Heman on 09/22/13 for regular follow up. She should have a repeat BMP at that time.  Copy of note sent to patient's primary care physician, primary cardiologist, and device following physician.  Alvis Lemmings, RN, BSN 09/06/2013 11:19 AM

## 2013-09-06 NOTE — Addendum Note (Signed)
Addended by: Alvis Lemmings C on: 09/06/2013 12:13 PM   Modules accepted: Orders

## 2013-09-15 ENCOUNTER — Encounter: Payer: Self-pay | Admitting: Internal Medicine

## 2013-09-17 ENCOUNTER — Telehealth: Payer: Self-pay | Admitting: Internal Medicine

## 2013-09-17 NOTE — Telephone Encounter (Signed)
New Message  Pt returning call. Pt states heather gave her a call today!!

## 2013-09-20 NOTE — Telephone Encounter (Signed)
I left a message for the patient on her cell # I was not in the office Friday, so I didn't try to reach her, but if she needs me for something please call me back on Thursday when I'm back in the office. I attempted her home # as well, no machine.

## 2013-09-22 ENCOUNTER — Ambulatory Visit (INDEPENDENT_AMBULATORY_CARE_PROVIDER_SITE_OTHER): Payer: Medicare Other | Admitting: Internal Medicine

## 2013-09-22 ENCOUNTER — Encounter: Payer: Self-pay | Admitting: Internal Medicine

## 2013-09-22 VITALS — BP 120/81 | HR 88 | Ht 65.0 in | Wt 198.0 lb

## 2013-09-22 DIAGNOSIS — I2510 Atherosclerotic heart disease of native coronary artery without angina pectoris: Secondary | ICD-10-CM

## 2013-09-22 DIAGNOSIS — I509 Heart failure, unspecified: Secondary | ICD-10-CM

## 2013-09-22 DIAGNOSIS — I255 Ischemic cardiomyopathy: Secondary | ICD-10-CM

## 2013-09-22 DIAGNOSIS — I472 Ventricular tachycardia, unspecified: Secondary | ICD-10-CM

## 2013-09-22 DIAGNOSIS — I2589 Other forms of chronic ischemic heart disease: Secondary | ICD-10-CM

## 2013-09-22 DIAGNOSIS — I4729 Other ventricular tachycardia: Secondary | ICD-10-CM

## 2013-09-22 NOTE — Progress Notes (Signed)
PCP: Bronson Curb, PA-C Primary Cardiologist:  Dr Fabio Pierce is a 63 y.o. female who presents today for routine electrophysiology followup.  Since last being seen in our clinic, the patient reports doing very well.  She remains active.  She is in the [facility] device clinic and finds that her monthly calls from Alvis Lemmings are quite helpful.  Today, she denies symptoms of palpitations, chest pain, shortness of breath,  lower extremity edema, dizziness, presyncope, syncope, or ICD shocks.  The patient is otherwise without complaint today.   Past Medical History  Diagnosis Date  . Heart disease   . History of stroke 37  . Myocardial infarction   . Lymphoma   . CHF (congestive heart failure)   . HTN (hypertension)   . HLD (hyperlipidemia)   . Kidney disease   . Ventricular tachycardia     CL 300 msec requiring ICD shocks therpay 5/14  . DM2 (diabetes mellitus, type 2), newly diagnosed 05/05/2013  . CKD (chronic kidney disease) stage 3, GFR 30-59 ml/min 05/05/2013  . Polymyalgia rheumatica    Past Surgical History  Procedure Laterality Date  . Cardiac defibrillator placement  03/2011    MDT ICD implanted at [facility] by Dr Tana Coast  . Coronary angioplasty with stent placement    . Pacemaker insertion      Current Outpatient Prescriptions  Medication Sig Dispense Refill  . amLODipine (NORVASC) 10 MG tablet Take 10 mg by mouth daily.      Marland Kitchen aspirin EC 81 MG tablet Take 81 mg by mouth daily.      Marland Kitchen atorvastatin (LIPITOR) 80 MG tablet Take 80 mg by mouth at bedtime.      . betamethasone dipropionate (DIPROLENE) 0.05 % ointment Apply 1 application topically 3 (three) times daily as needed (dermatitis). Applies to forehead and scalp      . clopidogrel (PLAVIX) 75 MG tablet Take 75 mg by mouth at bedtime.       Marland Kitchen ezetimibe (ZETIA) 10 MG tablet Take 10 mg by mouth at bedtime.       Marland Kitchen glipiZIDE (GLUCOTROL XL) 2.5 MG 24 hr tablet Take 2.5 mg by mouth every morning.       .  isosorbide mononitrate (IMDUR) 30 MG 24 hr tablet Take 30 mg by mouth daily.      Marland Kitchen lisinopril-hydrochlorothiazide (PRINZIDE,ZESTORETIC) 20-12.5 MG per tablet Take 2 tablets by mouth daily.       . metoprolol succinate (TOPROL-XL) 50 MG 24 hr tablet Take 1 tablet (50 mg total) by mouth 2 (two) times daily. Take with or immediately following a meal.  180 tablet  0  . Multiple Vitamin (MULTIVITAMIN WITH MINERALS) TABS Take 1 tablet by mouth at bedtime.       . nitroGLYCERIN (NITROSTAT) 0.4 MG SL tablet Place 1 tablet (0.4 mg total) under the tongue every 5 (five) minutes x 3 doses as needed for chest pain.  25 tablet  4  . Potassium Chloride (KLOR-CON PO) Take 10 mEq by mouth daily.       No current facility-administered medications for this visit.   ROS- all systems are reviewed and negative except as per HPI above  Physical Exam: Filed Vitals:   09/22/13 1114  BP: 120/81  Pulse: 88  Height: 5\' 5"  (1.651 m)  Weight: 198 lb (89.812 kg)    GEN- The patient is well appearing, alert and oriented x 3 today.   Head- normocephalic, atraumatic Eyes-  Sclera clear, conjunctiva pink  Ears- hearing intact Oropharynx- clear Lungs- Clear to ausculation bilaterally, normal work of breathing Chest- ICD pocket is well healed Heart- Regular rate and rhythm, no murmurs, rubs or gallops, PMI not laterally displaced GI- soft, NT, ND, + BS Extremities- no clubbing, cyanosis, or edema  ICD interrogation- reviewed in detail today,  See PACEART report  Assessment and Plan:  1.  Chronic systolic dysfunction/ Ischemic CM euvolemic today Stable on an appropriate medical regimen Normal ICD function See Pace Art report No changes today 2 gram sodium diet encouraged We will follow her optivol monthly in our ICM clinic  2. VT Well controlled Continue metoprolol  3. Tobacco Cessation is advised  [facility]  Return to see Dr Johnsie Cancel as scheduled Return to see me in the device clinic in 12  months

## 2013-09-22 NOTE — Patient Instructions (Signed)
Remote monitoring is used to monitor your  ICD from home. This monitoring reduces the number of office visits required to check your device to one time per year. It allows [place] to keep an eye on the functioning of your device to ensure it is working properly. You are scheduled for a device check from home on 12-27-2013. You may send your transmission at any time that day. If you have a wireless device, the transmission will be sent automatically. After your physician reviews your transmission, you will receive a postcard with your next transmission date.  Your physician recommends that you schedule a follow-up appointment in: 12 months with Dr.Allred

## 2013-09-23 LAB — MDC_IDC_ENUM_SESS_TYPE_INCLINIC
Brady Statistic AP VS Percent: 67.74 %
Brady Statistic AS VS Percent: 32.22 %
HighPow Impedance: 171 Ohm
HighPow Impedance: 304 Ohm
HighPow Impedance: 60 Ohm
Lead Channel Impedance Value: 475 Ohm
Lead Channel Pacing Threshold Amplitude: 0.75 V
Lead Channel Pacing Threshold Pulse Width: 0.4 ms
Lead Channel Pacing Threshold Pulse Width: 0.4 ms
Lead Channel Sensing Intrinsic Amplitude: 2.75 mV
Lead Channel Sensing Intrinsic Amplitude: 5.375 mV
Lead Channel Setting Pacing Amplitude: 2 V
Lead Channel Setting Pacing Amplitude: 2.5 V
MDC IDC MSMT BATTERY VOLTAGE: 3.1 V
MDC IDC MSMT LEADCHNL RA PACING THRESHOLD AMPLITUDE: 0.625 V
MDC IDC MSMT LEADCHNL RA SENSING INTR AMPL: 3.5 mV
MDC IDC MSMT LEADCHNL RV IMPEDANCE VALUE: 361 Ohm
MDC IDC MSMT LEADCHNL RV SENSING INTR AMPL: 7 mV
MDC IDC SESS DTM: 20150701152048
MDC IDC SET LEADCHNL RV PACING PULSEWIDTH: 0.4 ms
MDC IDC SET LEADCHNL RV SENSING SENSITIVITY: 0.3 mV
MDC IDC SET ZONE DETECTION INTERVAL: 280 ms
MDC IDC SET ZONE DETECTION INTERVAL: 350 ms
MDC IDC STAT BRADY AP VP PERCENT: 0.02 %
MDC IDC STAT BRADY AS VP PERCENT: 0.02 %
MDC IDC STAT BRADY RA PERCENT PACED: 67.77 %
MDC IDC STAT BRADY RV PERCENT PACED: 0.04 %
Zone Setting Detection Interval: 270 ms
Zone Setting Detection Interval: 340 ms
Zone Setting Detection Interval: 370 ms

## 2013-10-25 ENCOUNTER — Encounter: Payer: Medicare Other | Admitting: *Deleted

## 2013-10-25 ENCOUNTER — Telehealth: Payer: Self-pay | Admitting: *Deleted

## 2013-10-25 NOTE — Telephone Encounter (Signed)
I left a message for the patient to call on her cell #. No answer/ machine at the home #.

## 2013-10-28 NOTE — Telephone Encounter (Signed)
I attempted to call the patient today. No answer at her home # and the voice mail on her cell # is full. I will call back on Monday.

## 2013-11-01 ENCOUNTER — Encounter: Payer: Self-pay | Admitting: *Deleted

## 2013-11-01 NOTE — Telephone Encounter (Signed)
No call back from the patient. Letter mailed stating fluids ok and will schedule her next transmission for 11/25/13.

## 2013-11-25 ENCOUNTER — Ambulatory Visit (INDEPENDENT_AMBULATORY_CARE_PROVIDER_SITE_OTHER): Payer: Medicare Other | Admitting: *Deleted

## 2013-11-25 ENCOUNTER — Telehealth: Payer: Self-pay | Admitting: *Deleted

## 2013-11-25 DIAGNOSIS — I509 Heart failure, unspecified: Secondary | ICD-10-CM

## 2013-11-25 DIAGNOSIS — Z95810 Presence of automatic (implantable) cardiac defibrillator: Secondary | ICD-10-CM

## 2013-11-25 NOTE — Telephone Encounter (Signed)
ICM transmission received. I left a message for the patient to call on her cell #. No answer at her home #.

## 2013-11-30 NOTE — Telephone Encounter (Signed)
I spoke with the patient. 

## 2013-11-30 NOTE — Progress Notes (Signed)
EPIC Encounter for ICM Monitoring  Patient Name: [name] Terry is a 63 y.o. female Date: 11/30/2013 Primary Care Physican: Mackey Birchwood Primary Cardiologist: Johnsie Cancel Electrophysiologist: Allred Dry Weight: 198 lbs       In the past month, have you:  1. Gained more than 2 pounds in a day or more than 5 pounds in a week? no  2. Had changes in your medications (with verification of current medications)? no  3. Had more shortness of breath than is usual for you? no  4. Limited your activity because of shortness of breath? no  5. Not been able to sleep because of shortness of breath? no  6. Had increased swelling in your feet or ankles? no  7. Had symptoms of dehydration (dizziness, dry mouth, increased thirst, decreased urine output) no  8. Had changes in sodium restriction? no  9. Been compliant with medication? Yes  ** The patient's only complaint this past month is that she had swelling due to a gout flare up. She did receive treatment for this and is doing well now. **   ICM trend:   Follow-up plan: ICM clinic phone appointment: 01/03/14  Copy of note sent to patient's primary care physician, primary cardiologist, and device following physician.  Alvis Lemmings, RN, BSN 11/30/2013 10:21 AM

## 2014-01-03 ENCOUNTER — Encounter: Payer: Self-pay | Admitting: *Deleted

## 2014-01-03 ENCOUNTER — Ambulatory Visit (INDEPENDENT_AMBULATORY_CARE_PROVIDER_SITE_OTHER): Payer: Medicare Other | Admitting: *Deleted

## 2014-01-03 ENCOUNTER — Telehealth: Payer: Self-pay | Admitting: *Deleted

## 2014-01-03 ENCOUNTER — Encounter: Payer: Self-pay | Admitting: Internal Medicine

## 2014-01-03 DIAGNOSIS — I255 Ischemic cardiomyopathy: Secondary | ICD-10-CM

## 2014-01-03 DIAGNOSIS — Z95810 Presence of automatic (implantable) cardiac defibrillator: Secondary | ICD-10-CM

## 2014-01-03 DIAGNOSIS — I519 Heart disease, unspecified: Secondary | ICD-10-CM

## 2014-01-03 LAB — MDC_IDC_ENUM_SESS_TYPE_REMOTE
Brady Statistic AP VS Percent: 73.83 %
Brady Statistic AS VS Percent: 26.12 %
Brady Statistic RV Percent Paced: 0.05 %
Date Time Interrogation Session: 20151012071806
HIGH POWER IMPEDANCE MEASURED VALUE: 190 Ohm
HIGH POWER IMPEDANCE MEASURED VALUE: 285 Ohm
HighPow Impedance: 66 Ohm
Lead Channel Impedance Value: 361 Ohm
Lead Channel Impedance Value: 456 Ohm
Lead Channel Pacing Threshold Amplitude: 0.75 V
Lead Channel Pacing Threshold Pulse Width: 0.4 ms
Lead Channel Pacing Threshold Pulse Width: 0.4 ms
Lead Channel Sensing Intrinsic Amplitude: 4.75 mV
Lead Channel Setting Pacing Amplitude: 2.5 V
MDC IDC MSMT BATTERY VOLTAGE: 3.08 V
MDC IDC MSMT LEADCHNL RA SENSING INTR AMPL: 3.375 mV
MDC IDC MSMT LEADCHNL RV PACING THRESHOLD AMPLITUDE: 0.875 V
MDC IDC SET LEADCHNL RA PACING AMPLITUDE: 2 V
MDC IDC SET LEADCHNL RV PACING PULSEWIDTH: 0.4 ms
MDC IDC SET LEADCHNL RV SENSING SENSITIVITY: 0.3 mV
MDC IDC SET ZONE DETECTION INTERVAL: 340 ms
MDC IDC STAT BRADY AP VP PERCENT: 0.03 %
MDC IDC STAT BRADY AS VP PERCENT: 0.02 %
MDC IDC STAT BRADY RA PERCENT PACED: 73.86 %
Zone Setting Detection Interval: 270 ms
Zone Setting Detection Interval: 280 ms
Zone Setting Detection Interval: 350 ms
Zone Setting Detection Interval: 370 ms

## 2014-01-03 NOTE — Progress Notes (Signed)
Remote ICD transmission.   

## 2014-01-03 NOTE — Progress Notes (Signed)
EPIC Encounter for ICM Monitoring  Patient Name: [name] Terry is a 63 y.o. female Date: 01/03/2014 Primary Care Physican: Mackey Birchwood Primary Cardiologist: Johnsie Cancel Electrophysiologist: Allred Dry Weight: 196 lbs       In the past month, have you:  1. Gained more than 2 pounds in a day or more than 5 pounds in a week? No. The patient states she has been trying to lose some weight. She has lost 2 lbs so far.   2. Had changes in your medications (with verification of current medications)? no  3. Had more shortness of breath than is usual for you? no  4. Limited your activity because of shortness of breath? no  5. Not been able to sleep because of shortness of breath? no  6. Had increased swelling in your feet or ankles? no  7. Had symptoms of dehydration (dizziness, dry mouth, increased thirst, decreased urine output) no  8. Had changes in sodium restriction? no  9. Been compliant with medication? Yes   ICM trend:   Follow-up plan: ICM clinic phone appointment: 02/03/14  Copy of note sent to patient's primary care physician, primary cardiologist, and device following physician.  Alvis Lemmings, RN, BSN 01/03/2014 3:18 PM

## 2014-01-03 NOTE — Addendum Note (Signed)
Addended by: Alvis Lemmings C on: 01/03/2014 03:24 PM   Modules accepted: Level of Service

## 2014-01-03 NOTE — Telephone Encounter (Signed)
I spoke with the patient. 

## 2014-01-03 NOTE — Telephone Encounter (Signed)
ICM transmission received. I left a message for the patient to call back at her cell #. No answer/ no machine at her home #.

## 2014-01-11 ENCOUNTER — Encounter: Payer: Self-pay | Admitting: Cardiology

## 2014-02-03 ENCOUNTER — Encounter: Payer: Medicare Other | Admitting: *Deleted

## 2014-02-04 ENCOUNTER — Telehealth: Payer: Self-pay | Admitting: *Deleted

## 2014-02-04 ENCOUNTER — Encounter: Payer: Self-pay | Admitting: *Deleted

## 2014-02-04 NOTE — Telephone Encounter (Signed)
ICM transmission received from 01/31/14. Per notes on her appointment for today, no medicaid for this month. I left a message on the patient's identified voice mail that her readings are good on her fluids and that she does not need to call me back unless she has any concerns. Next ICM transmission will be on 03/10/14.

## 2014-03-03 ENCOUNTER — Encounter (HOSPITAL_COMMUNITY): Payer: Self-pay | Admitting: Cardiovascular Disease

## 2014-03-10 ENCOUNTER — Ambulatory Visit (INDEPENDENT_AMBULATORY_CARE_PROVIDER_SITE_OTHER): Payer: Medicare Other | Admitting: *Deleted

## 2014-03-10 ENCOUNTER — Encounter: Payer: Self-pay | Admitting: *Deleted

## 2014-03-10 DIAGNOSIS — I255 Ischemic cardiomyopathy: Secondary | ICD-10-CM

## 2014-03-10 DIAGNOSIS — Z95810 Presence of automatic (implantable) cardiac defibrillator: Secondary | ICD-10-CM

## 2014-03-10 NOTE — Progress Notes (Signed)
EPIC Encounter for ICM Monitoring  Patient Name: [name] Terry is a 63 y.o. female Date: 03/10/2014 Primary Care Physican: Mackey Birchwood Primary Cardiologist: Johnsie Cancel Electrophysiologist: Allred Dry Weight: 190 lbs       In the past month, have you:  1. Gained more than 2 pounds in a day or more than 5 pounds in a week? No. The patient states she has been trying to lose some weight. She is down about 6 lbs (from 196 lbs).   2. Had changes in your medications (with verification of current medications)? no  3. Had more shortness of breath than is usual for you? no  4. Limited your activity because of shortness of breath? no  5. Not been able to sleep because of shortness of breath? no  6. Had increased swelling in your feet or ankles? no  7. Had symptoms of dehydration (dizziness, dry mouth, increased thirst, decreased urine output) no  8. Had changes in sodium restriction? no  9. Been compliant with medication? Yes   ICM trend:   Follow-up plan: ICM clinic phone appointment: 04/14/14  Copy of note sent to patient's primary care physician, primary cardiologist, and device following physician.  Alvis Lemmings, RN, BSN 03/10/2014 4:05 PM

## 2014-03-23 DIAGNOSIS — M109 Gout, unspecified: Secondary | ICD-10-CM | POA: Insufficient documentation

## 2014-04-14 ENCOUNTER — Encounter: Payer: Self-pay | Admitting: *Deleted

## 2014-04-14 ENCOUNTER — Ambulatory Visit (INDEPENDENT_AMBULATORY_CARE_PROVIDER_SITE_OTHER): Payer: Medicare Other | Admitting: *Deleted

## 2014-04-14 DIAGNOSIS — Z95810 Presence of automatic (implantable) cardiac defibrillator: Secondary | ICD-10-CM

## 2014-04-14 DIAGNOSIS — I519 Heart disease, unspecified: Secondary | ICD-10-CM

## 2014-04-14 NOTE — Progress Notes (Signed)
EPIC Encounter for ICM Monitoring  Patient Name: [name] Terry is a 64 y.o. female Date: 04/14/2014 Primary Care Physican: Mackey Birchwood Primary Cardiologist: Johnsie Cancel Electrophysiologist: Allred Dry Weight: 183 lbs       In the past month, have you:  1. Gained more than 2 pounds in a day or more than 5 pounds in a week? No. The patient has been trying to lose some weight. She is down from 190 lbs last month.  2. Had changes in your medications (with verification of current medications)? no  3. Had more shortness of breath than is usual for you? no  4. Limited your activity because of shortness of breath? no  5. Not been able to sleep because of shortness of breath? no  6. Had increased swelling in your feet or ankles? no  7. Had symptoms of dehydration (dizziness, dry mouth, increased thirst, decreased urine output) no  8. Had changes in sodium restriction? no  9. Been compliant with medication? Yes   ICM trend:   Follow-up plan: ICM clinic phone appointment: 05/16/14  Copy of note sent to patient's primary care physician, primary cardiologist, and device following physician.  Alvis Lemmings, RN, BSN 04/14/2014 11:17 AM

## 2014-05-16 ENCOUNTER — Telehealth: Payer: Self-pay | Admitting: Cardiology

## 2014-05-16 ENCOUNTER — Encounter: Payer: Medicare Other | Admitting: *Deleted

## 2014-05-16 NOTE — Telephone Encounter (Signed)
LMOVM reminding pt to send remote transmission.   

## 2014-05-17 ENCOUNTER — Encounter: Payer: Self-pay | Admitting: Cardiology

## 2014-05-19 ENCOUNTER — Inpatient Hospital Stay (HOSPITAL_COMMUNITY)
Admission: EM | Admit: 2014-05-19 | Discharge: 2014-05-21 | DRG: 313 | Disposition: A | Discharge status: Home / Self Care | Payer: Medicare Other | Source: Other Acute Inpatient Hospital | Attending: Internal Medicine | Admitting: Internal Medicine

## 2014-05-19 ENCOUNTER — Encounter (HOSPITAL_COMMUNITY): Payer: Self-pay

## 2014-05-19 DIAGNOSIS — E1121 Type 2 diabetes mellitus with diabetic nephropathy: Secondary | ICD-10-CM | POA: Diagnosis present

## 2014-05-19 DIAGNOSIS — I129 Hypertensive chronic kidney disease with stage 1 through stage 4 chronic kidney disease, or unspecified chronic kidney disease: Secondary | ICD-10-CM | POA: Diagnosis present

## 2014-05-19 DIAGNOSIS — Z7902 Long term (current) use of antithrombotics/antiplatelets: Secondary | ICD-10-CM

## 2014-05-19 DIAGNOSIS — I2510 Atherosclerotic heart disease of native coronary artery without angina pectoris: Secondary | ICD-10-CM | POA: Diagnosis present

## 2014-05-19 DIAGNOSIS — I252 Old myocardial infarction: Secondary | ICD-10-CM | POA: Diagnosis not present

## 2014-05-19 DIAGNOSIS — N183 Chronic kidney disease, stage 3 unspecified: Secondary | ICD-10-CM | POA: Diagnosis present

## 2014-05-19 DIAGNOSIS — R079 Chest pain, unspecified: Secondary | ICD-10-CM | POA: Diagnosis present

## 2014-05-19 DIAGNOSIS — Z7901 Long term (current) use of anticoagulants: Secondary | ICD-10-CM | POA: Diagnosis not present

## 2014-05-19 DIAGNOSIS — R0789 Other chest pain: Secondary | ICD-10-CM | POA: Diagnosis present

## 2014-05-19 DIAGNOSIS — E039 Hypothyroidism, unspecified: Secondary | ICD-10-CM | POA: Diagnosis present

## 2014-05-19 DIAGNOSIS — Z95810 Presence of automatic (implantable) cardiac defibrillator: Secondary | ICD-10-CM | POA: Diagnosis not present

## 2014-05-19 DIAGNOSIS — Z7982 Long term (current) use of aspirin: Secondary | ICD-10-CM

## 2014-05-19 DIAGNOSIS — Z8673 Personal history of transient ischemic attack (TIA), and cerebral infarction without residual deficits: Secondary | ICD-10-CM

## 2014-05-19 DIAGNOSIS — Z888 Allergy status to other drugs, medicaments and biological substances status: Secondary | ICD-10-CM

## 2014-05-19 DIAGNOSIS — Z955 Presence of coronary angioplasty implant and graft: Secondary | ICD-10-CM | POA: Diagnosis not present

## 2014-05-19 DIAGNOSIS — E785 Hyperlipidemia, unspecified: Secondary | ICD-10-CM | POA: Diagnosis present

## 2014-05-19 DIAGNOSIS — F17210 Nicotine dependence, cigarettes, uncomplicated: Secondary | ICD-10-CM | POA: Diagnosis present

## 2014-05-19 DIAGNOSIS — I5022 Chronic systolic (congestive) heart failure: Secondary | ICD-10-CM | POA: Diagnosis present

## 2014-05-19 DIAGNOSIS — Z91041 Radiographic dye allergy status: Secondary | ICD-10-CM

## 2014-05-19 DIAGNOSIS — Z923 Personal history of irradiation: Secondary | ICD-10-CM

## 2014-05-19 DIAGNOSIS — E119 Type 2 diabetes mellitus without complications: Secondary | ICD-10-CM

## 2014-05-19 DIAGNOSIS — E860 Dehydration: Secondary | ICD-10-CM | POA: Diagnosis present

## 2014-05-19 DIAGNOSIS — I472 Ventricular tachycardia, unspecified: Secondary | ICD-10-CM

## 2014-05-19 DIAGNOSIS — Z9861 Coronary angioplasty status: Secondary | ICD-10-CM

## 2014-05-19 DIAGNOSIS — I255 Ischemic cardiomyopathy: Secondary | ICD-10-CM | POA: Diagnosis present

## 2014-05-19 DIAGNOSIS — Z8572 Personal history of non-Hodgkin lymphomas: Secondary | ICD-10-CM

## 2014-05-19 DIAGNOSIS — I10 Essential (primary) hypertension: Secondary | ICD-10-CM | POA: Diagnosis present

## 2014-05-19 LAB — GLUCOSE, CAPILLARY
GLUCOSE-CAPILLARY: 108 mg/dL — AB (ref 70–99)
GLUCOSE-CAPILLARY: 79 mg/dL (ref 70–99)
Glucose-Capillary: 72 mg/dL (ref 70–99)
Glucose-Capillary: 188 mg/dL — ABNORMAL HIGH (ref 70–99)
Glucose-Capillary: 105 mg/dL — ABNORMAL HIGH (ref 70–99)

## 2014-05-19 LAB — TSH: TSH: 12.116 u[IU]/mL — AB (ref 0.350–4.500)

## 2014-05-19 LAB — APTT: aPTT: 30 seconds (ref 24–37)

## 2014-05-19 LAB — TROPONIN I: Troponin I: <0.03 ng/mL (ref <0.031)

## 2014-05-19 LAB — MAGNESIUM: Magnesium: 2.5 mg/dL (ref 1.5–2.5)

## 2014-05-19 LAB — MRSA PCR SCREENING: MRSA by PCR: NEGATIVE

## 2014-05-19 MED ORDER — HYDROCHLOROTHIAZIDE 25 MG PO TABS
25.0000 mg | ORAL_TABLET | Freq: Every day | ORAL | Status: DC
  Administered 2014-05-19 – 2014-05-21 (×3): 25 mg via ORAL
  Filled 2014-05-19 (×3): qty 1

## 2014-05-19 MED ORDER — HEPARIN SODIUM (PORCINE) 5000 UNIT/ML IJ SOLN
5000.0000 [IU] | Freq: Three times a day (TID) | INTRAMUSCULAR | Status: DC
  Administered 2014-05-19 – 2014-05-21 (×5): 5000 [IU] via SUBCUTANEOUS
  Filled 2014-05-19 (×5): qty 1

## 2014-05-19 MED ORDER — ADULT MULTIVITAMIN W/MINERALS CH: 1.0000 | ORAL_TABLET | Freq: Every day | ORAL | Status: DC

## 2014-05-19 MED ORDER — INSULIN ASPART 100 UNIT/ML ~~LOC~~ SOLN
0.0000 [IU] | Freq: Three times a day (TID) | SUBCUTANEOUS | Status: DC
  Administered 2014-05-19: 2 [IU] via SUBCUTANEOUS
  Administered 2014-05-20: 1 [IU] via SUBCUTANEOUS

## 2014-05-19 MED ORDER — SODIUM CHLORIDE 0.9 % IV BOLUS (SEPSIS): 500.0000 mL | Freq: Once | INTRAVENOUS | Status: DC

## 2014-05-19 MED ORDER — CLOPIDOGREL BISULFATE 75 MG PO TABS
75.0000 mg | ORAL_TABLET | Freq: Every day | ORAL | Status: DC
  Administered 2014-05-19 – 2014-05-20 (×2): 75 mg via ORAL
  Filled 2014-05-19 (×2): qty 1

## 2014-05-19 MED ORDER — METOPROLOL SUCCINATE ER 50 MG PO TB24
50.0000 mg | ORAL_TABLET | Freq: Two times a day (BID) | ORAL | Status: DC
  Administered 2014-05-19 – 2014-05-21 (×5): 50 mg via ORAL
  Filled 2014-05-19 (×5): qty 1

## 2014-05-19 MED ORDER — SODIUM CHLORIDE 0.9 % IV SOLN: INTRAVENOUS | Status: DC

## 2014-05-19 MED ORDER — AMLODIPINE BESYLATE 10 MG PO TABS
10.0000 mg | ORAL_TABLET | Freq: Every day | ORAL | Status: DC
  Administered 2014-05-19 – 2014-05-21 (×3): 10 mg via ORAL
  Filled 2014-05-19 (×3): qty 1

## 2014-05-19 MED ORDER — SODIUM CHLORIDE 0.9 % IV SOLN
INTRAVENOUS | Status: DC
  Administered 2014-05-19: 09:00:00 via INTRAVENOUS

## 2014-05-19 MED ORDER — ASPIRIN EC 81 MG PO TBEC
81.0000 mg | DELAYED_RELEASE_TABLET | Freq: Every day | ORAL | Status: DC
  Administered 2014-05-19 – 2014-05-21 (×3): 81 mg via ORAL
  Filled 2014-05-19 (×3): qty 1

## 2014-05-19 MED ORDER — ATORVASTATIN CALCIUM 80 MG PO TABS
80.0000 mg | ORAL_TABLET | Freq: Every day | ORAL | Status: DC
  Administered 2014-05-19 – 2014-05-20 (×2): 80 mg via ORAL
  Filled 2014-05-19 (×2): qty 1

## 2014-05-19 MED ORDER — SODIUM CHLORIDE 0.9 % IJ SOLN
3.0000 mL | Freq: Two times a day (BID) | INTRAMUSCULAR | Status: DC
  Administered 2014-05-20 – 2014-05-21 (×2): 3 mL via INTRAVENOUS

## 2014-05-19 MED ORDER — LISINOPRIL 40 MG PO TABS
40.0000 mg | ORAL_TABLET | Freq: Every day | ORAL | Status: DC
  Administered 2014-05-19 – 2014-05-21 (×3): 40 mg via ORAL
  Filled 2014-05-19: qty 1
  Filled 2014-05-19: qty 2
  Filled 2014-05-19: qty 1

## 2014-05-19 MED ORDER — DIPHENHYDRAMINE HCL 25 MG PO CAPS
25.0000 mg | ORAL_CAPSULE | Freq: Every evening | ORAL | Status: DC | PRN
  Administered 2014-05-19 – 2014-05-20 (×2): 25 mg via ORAL
  Filled 2014-05-19 (×2): qty 1

## 2014-05-19 MED ORDER — NITROGLYCERIN 0.4 MG SL SUBL: 0.4000 mg | SUBLINGUAL_TABLET | SUBLINGUAL | Status: DC | PRN

## 2014-05-19 MED ORDER — TRIAMCINOLONE ACETONIDE 0.5 % EX CREA: TOPICAL_CREAM | CUTANEOUS | Status: DC | PRN

## 2014-05-19 MED ORDER — HEPARIN SODIUM (PORCINE) 5000 UNIT/ML IJ SOLN: 5000.0000 [IU] | Freq: Three times a day (TID) | INTRAMUSCULAR | Status: DC

## 2014-05-19 MED ORDER — LISINOPRIL-HYDROCHLOROTHIAZIDE 20-12.5 MG PO TABS: 2.0000 | ORAL_TABLET | Freq: Every day | ORAL | Status: DC

## 2014-05-19 MED ORDER — SODIUM CHLORIDE 0.9 % IJ SOLN
3.0000 mL | Freq: Two times a day (BID) | INTRAMUSCULAR | Status: DC
  Administered 2014-05-20: 3 mL via INTRAVENOUS

## 2014-05-19 MED ORDER — AMIODARONE HCL 200 MG PO TABS
200.0000 mg | ORAL_TABLET | Freq: Two times a day (BID) | ORAL | Status: DC
  Administered 2014-05-19 – 2014-05-21 (×5): 200 mg via ORAL
  Filled 2014-05-19 (×5): qty 1

## 2014-05-19 MED ORDER — ISOSORBIDE MONONITRATE ER 30 MG PO TB24
30.0000 mg | ORAL_TABLET | Freq: Every day | ORAL | Status: DC
  Administered 2014-05-19 – 2014-05-21 (×3): 30 mg via ORAL
  Filled 2014-05-19 (×3): qty 1

## 2014-05-19 MED ORDER — EZETIMIBE 10 MG PO TABS
10.0000 mg | ORAL_TABLET | Freq: Every day | ORAL | Status: DC
  Administered 2014-05-19 – 2014-05-20 (×2): 10 mg via ORAL
  Filled 2014-05-19 (×2): qty 1

## 2014-05-19 MED ORDER — MORPHINE SULFATE 2 MG/ML IJ SOLN: 2.0000 mg | INTRAMUSCULAR | Status: DC | PRN

## 2014-05-19 NOTE — Consult Note (Signed)
Reason for Consult:   VT  Requesting Physician: [facility]  HPI:    This is a 64 y.o. female followed by Dr Johnsie Cancel and Dr Rayann Heman, as well as a cardiologist in [place], with a past medical history significant for cardiomyopathy, presumably ischemic, s/p MDT ICD at [facility] 2013, CAD with a history of prior PCI's, NIDDM, HTN, and dyslipidemia. She saw Dr Johnsie Cancel in July 2014 to be established here with a cardiologist. She saw Dr Rayann Heman for an ICD check in Aug 2014 and was noted to have had NSVT with therapy in May of 2014. He adjusted her device and saw her back in follow up in Dec 2014. She was admitted to [facility] in Feb 2015 with chest pain. Cath showed a patent RCA stent, 30% LAD, 50% CFX, and 90% OM3. She was treated medically. Her EF by echo 12/14 was 25-30%. LOV with Dr Johnsie Cancel was March 2015.              The pt says she had been doing well till this past Friday 05/13/14 when she went to the hospital in [place] with ICD discharges "4 times". She was kept till 2/21 and put on Amiodarone. She went back to the hospital that night with chest pain and "hard heart beats". She was kept till 05/18/14 and discharged. She went back to the hospital that night with chest pain and asked to be transferred to [facility]. Her Troponin was negative at [facility] and here. Sh denies SOB. She continues to have intermittent "hard heart beats". Telemetry here has shown paced rhythm but reportedly telemetry at [facility] showed a 30 bt run of VT. Her ICD did not fire.            PMHx:  Past Medical History  Diagnosis Date  . Heart disease   . History of stroke 23  . Myocardial infarction   . Lymphoma   . CHF (congestive heart failure)   . HTN (hypertension)   . HLD (hyperlipidemia)   . Kidney disease   . Ventricular tachycardia     CL 300 msec requiring ICD shocks therpay 5/14  . DM2 (diabetes mellitus, type 2), newly diagnosed 05/05/2013  . CKD (chronic kidney disease) stage 3, GFR 30-59 ml/min 05/05/2013  .  Polymyalgia rheumatica     Past Surgical History  Procedure Laterality Date  . Cardiac defibrillator placement  03/2011    MDT ICD implanted at [facility] by Dr Tana Coast  . Coronary angioplasty with stent placement    . Pacemaker insertion    . Left and right heart catheterization with coronary angiogram N/A 05/03/2013    Procedure: LEFT AND RIGHT HEART CATHETERIZATION WITH CORONARY ANGIOGRAM;  Surgeon: Burnell Blanks, MD;  Location: [facility] CATH LAB;  Service: Cardiovascular;  Laterality: N/A;    SOCHx:  reports that she has been smoking.  She does not have any smokeless tobacco history on file. She reports that she does not drink alcohol or use illicit drugs.  FAMHx: Family History  Problem Relation Age of Onset  . Diabetes Father   . Hypertension Father   . Heart disease Father   . Alcoholism Father   . Hypertension Mother   . Heart disease Mother   . Breast cancer Mother   . Diabetes Brother   . Diabetes Brother   . Diabetes Brother   . Diabetes Brother   . Hypertension Sister   . Heart disease Sister   . Alcoholism  Sister   . Thyroid disease Sister     ALLERGIES: Allergies  Allergen Reactions  . Potassium-Containing Compounds Other (See Comments)    Headaches  . Contrast Media [Iodinated Diagnostic Agents] Rash  . Flagyl [Metronidazole] Swelling and Rash    Face swells    ROS: Pertinent items are noted in HPI. see H&P for complete ROS  HOME MEDICATIONS: Prior to Admission medications   Medication Sig Start Date End Date Taking? Authorizing Provider  amLODipine (NORVASC) 10 MG tablet Take 10 mg by mouth daily.    Historical Provider, MD  aspirin EC 81 MG tablet Take 81 mg by mouth daily.    Historical Provider, MD  atorvastatin (LIPITOR) 80 MG tablet Take 80 mg by mouth at bedtime.    Historical Provider, MD  betamethasone dipropionate (DIPROLENE) 0.05 % ointment Apply 1 application topically 3 (three) times daily as needed (dermatitis). Applies to forehead and  scalp    Historical Provider, MD  clopidogrel (PLAVIX) 75 MG tablet Take 75 mg by mouth at bedtime.     Historical Provider, MD  ezetimibe (ZETIA) 10 MG tablet Take 10 mg by mouth at bedtime.     Historical Provider, MD  glipiZIDE (GLUCOTROL XL) 2.5 MG 24 hr tablet Take 2.5 mg by mouth every morning.     Historical Provider, MD  isosorbide mononitrate (IMDUR) 30 MG 24 hr tablet Take 30 mg by mouth daily.    Historical Provider, MD  lisinopril-hydrochlorothiazide (PRINZIDE,ZESTORETIC) 20-12.5 MG per tablet Take 2 tablets by mouth daily.     Historical Provider, MD  metoprolol succinate (TOPROL-XL) 50 MG 24 hr tablet Take 1 tablet (50 mg total) by mouth 2 (two) times daily. Take with or immediately following a meal. 06/02/13   Thompson Grayer, MD  Multiple Vitamin (MULTIVITAMIN WITH MINERALS) TABS Take 1 tablet by mouth at bedtime.     Historical Provider, MD  nitroGLYCERIN (NITROSTAT) 0.4 MG SL tablet Place 1 tablet (0.4 mg total) under the tongue every 5 (five) minutes x 3 doses as needed for chest pain. 05/05/13   Isaiah Serge, NP  Potassium Chloride (KLOR-CON PO) Take 10 mEq by mouth daily.    Historical Provider, MD    HOSPITAL MEDICATIONS: I have reviewed the patient's current medications.  VITALS: Blood pressure 115/79, pulse 59, temperature 98.4 F (36.9 C), temperature source Oral, resp. rate 18, height 5\' 3"  (1.6 m), weight 179 lb 0.2 oz (81.2 kg), SpO2 100 %.  PHYSICAL EXAM: General appearance: alert, cooperative and no distress Neck: no carotid bruit and no JVD Lungs: clear to auscultation bilaterally Heart: regular rate and rhythm and 2/6 MR murmur Abdomen: soft, non-tender; bowel sounds normal; no masses,  no organomegaly Extremities: extremities normal, atraumatic, no cyanosis or edema Pulses: 2+ Rt PT, diminished Lt LE pulses Skin: Skin color, texture, turgor normal. No rashes or lesions Neurologic: Grossly normal  LABS: Results for orders placed or performed during the  hospital encounter of 05/19/14 (from the past 24 hour(s))  MRSA PCR Screening     Status: None   Collection Time: 05/19/14  5:20 AM  Result Value Ref Range   MRSA by PCR NEGATIVE NEGATIVE  Glucose, capillary     Status: Abnormal   Collection Time: 05/19/14  6:24 AM  Result Value Ref Range   Glucose-Capillary 108 (H) 70 - 99 mg/dL  Troponin I (q 6hr x 3)     Status: None   Collection Time: 05/19/14  7:38 AM  Result Value Ref Range  Troponin I <0.03 <0.031 ng/mL  TSH     Status: Abnormal   Collection Time: 05/19/14  7:38 AM  Result Value Ref Range   TSH 12.116 (H) 0.350 - 4.500 uIU/mL  APTT     Status: None   Collection Time: 05/19/14  7:38 AM  Result Value Ref Range   aPTT 30 24 - 37 seconds  Glucose, capillary     Status: None   Collection Time: 05/19/14  7:51 AM  Result Value Ref Range   Glucose-Capillary 79 70 - 99 mg/dL    EKG: Paced  IMAGING: No results found.  IMPRESSION: Principal Problem:   Chest pain Active Problems:   Ventricular tachycardia   AICD (automatic cardioverter/defibrillator) present, dual chamber medtronic   Cardiomyopathy, ischemic-EF 25-30%   CAD S/P prior RCA PCI, cath 05/03/2013- med Rx   DM2 with nephropathy   CKD (chronic kidney disease) stage 3, GFR 30-59 ml/min   History of stroke   HTN (hypertension)   HLD (hyperlipidemia)   RECOMMENDATION: Will review records from [facility]. Continue Amiodarone 200 mg BID. Agree with repeat TSH with free T4, check LFTs and Mg++. Keep K+ >4.  Dr Lovena Le to see.   Time Spent Directly with Patient: 45 minutes  Erlene Quan [phone] beeper 05/19/2014, 11:06 AM   EP Attending  Patient seen and examined. Agree with findings above. The pastient has an ICM, chronic systolic CHF who is admitted with refractory VT. She has been on amiodarone. Would increase amio to 200 tid. As she is known by Dr. Greggory Brandy, I will defer the decision about additional treatment like catheter ablation to his judgement.   Mikle Bosworth.D.

## 2014-05-19 NOTE — Progress Notes (Signed)
Patient ID: [name] Terry  female  PZW:[id number]    DOB: [birth year]    DOA: 05/19/2014  PCP: Bronson Curb, PA-C   Brief history of present illness  [name] Terry is a 64 y.o. female with past medical history of coronary artery disease, post status of stent placement, CHF, hypertension, hyperlipidemia, AICD, diabetes mellitus, chronic kidney disease-stage III, who presents with chest pain.  Patient reported that because she had chest pain and AICD went off, she was admitted to [facility] on last Friday and discharged on Sunday. Patient could not remember the details of treatment in [facility]. According to the documentation from [facility], patient had chest pain again which started at 2 PM the day prior to admission here. Chest pain was described as persistent and pressure-like pain. Chest pain was not alleviated or aggravated by any known factors, 5 out of 10 in severity. Patient was treated by EMS with aspirin, nitroglycerin with partial relief. Per patient's request, she was transferred to [facility] hospital for further evaluation and treatment. Patient was chest pain-free at the time of admission. Per nurse, pateint had transient v-tach, but AICD did not fire up. In ED of [facility], patient was found to have negative Troponin, No leukocytosis, BNP 238. Chest x-ray was negative for acute abnormalities. BMP is normal except for creatinine 1.6 and BUN 26. EKG showed T-wave flattening in inferior leads and mildly inverted T in V4-V6. Patient  admitted to inpatient for further evaluation and treatment.   Assessment/Plan: Principal Problem:   Chest pain: History of CAD, AICD, ischemic cardiomyopathy, prior RCA PCI, 05/03/13 and recommended medical treatment - Chest x-ray negative for acute abnormalities, troponins negative so far - 2-D echo done - Given her risk factors, AICD firing, call cardiology consult, discussed with Dr. Marlou Porch, will likely need EP consult as  well  Active Problems: Ischemic cardiomyopathy with systolic chronic CHF - 2-D echo on 03/23/13 showed EF of 25-30% - Clinically compensated, BNP 238, decrease IV fluids to 50 mL an hour until patient is npo - Follow 2-D echo, continue aspirin, metoprolol, ACEI    History of stroke - Continue aspirin, Plavix, Lipitor    HTN (hypertension) - Currently stable, continue amlodipine, Imdur, lisinopril, HCTZ, Toprol-XL    HLD (hyperlipidemia) - Continue Lipitor, Zetia    AICD (automatic cardioverter/defibrillator) present, dual chamber medtronic, Ventricular tachycardia,   Cardiomyopathy, ischemic-EF 25-30% - Cardiology consult called, patient had episode of AICD firing 4 times    DM2 , uncontrolled with nephropathy, chronic kidney disease - Blood sugars controlled, continue sliding scale insulin, check hemoglobin A1c    CKD (chronic kidney disease) stage 3, GFR 30-59 ml/min - Baseline creatinine 1.7 on 06/09/13. creatinine at [facility] 1.6, BUN 26  Hypothyroidism - TSH 12.1, ordered T3 and free T4  DVT Prophylaxis: Heparin subcutaneous  Code Status: Full code  Family Communication:  Disposition:  Consultants:  Cardiology, Dr. Marlou Porch  Procedures:  echo  Antibiotics:  None    Subjective: Patient seen and examined, no chest pain currently, denies any nausea, vomiting, abdominal pain or shortness of breath.  Objective: Weight change:  No intake or output data in the 24 hours ending 05/19/14 1117 Blood pressure 115/79, pulse 59, temperature 98.4 F (36.9 C), temperature source Oral, resp. rate 18, height 5\' 3"  (1.6 m), weight 81.2 kg (179 lb 0.2 oz), SpO2 100 %.  Physical Exam: General: Alert and awake, oriented x3, not in any acute distress. CVS: S1-S2 clear, no murmur rubs  or gallops Chest: clear to auscultation bilaterally, no wheezing, rales or rhonchi, no chest wall tenderness, pacer in the left chest wall Abdomen: soft nontender, nondistended, normal  bowel sounds  Extremities: no cyanosis, clubbing or edema noted bilaterally Neuro: Cranial nerves II-XII intact, no focal neurological deficits  Lab Results: Basic Metabolic Panel: No results for input(s): NA, K, CL, CO2, GLUCOSE, BUN, CREATININE, CALCIUM, MG, PHOS in the last 168 hours. Liver Function Tests: No results for input(s): AST, ALT, ALKPHOS, BILITOT, PROT, ALBUMIN in the last 168 hours. No results for input(s): LIPASE, AMYLASE in the last 168 hours. No results for input(s): AMMONIA in the last 168 hours. CBC: No results for input(s): WBC, NEUTROABS, HGB, HCT, MCV, PLT in the last 168 hours. Cardiac Enzymes:  Recent Labs Lab 05/19/14 0738  TROPONINI <0.03   BNP: Invalid input(s): POCBNP CBG:  Recent Labs Lab 05/19/14 0624 05/19/14 0751  GLUCAP 108* 79     Micro Results: Recent Results (from the past 240 hour(s))  MRSA PCR Screening     Status: None   Collection Time: 05/19/14  5:20 AM  Result Value Ref Range Status   MRSA by PCR NEGATIVE NEGATIVE Final    Comment:        The GeneXpert MRSA Assay (FDA approved for NASAL specimens only), is one component of a comprehensive MRSA colonization surveillance program. It is not intended to diagnose MRSA infection nor to guide or monitor treatment for MRSA infections.     Studies/Results: No results found.  Medications: Scheduled Meds: . amLODipine  10 mg Oral Daily  . aspirin EC  81 mg Oral Daily  . atorvastatin  80 mg Oral QHS  . clopidogrel  75 mg Oral QHS  . ezetimibe  10 mg Oral QHS  . heparin  5,000 Units Subcutaneous 3 times per day  . lisinopril  40 mg Oral Daily   And  . hydrochlorothiazide  25 mg Oral Daily  . insulin aspart  0-9 Units Subcutaneous TID WC  . isosorbide mononitrate  30 mg Oral Daily  . metoprolol succinate  50 mg Oral BID  . sodium chloride  500 mL Intravenous Once  . sodium chloride  3 mL Intravenous Q12H  . sodium chloride  3 mL Intravenous Q12H   Time spent 30  minutes   LOS: 0 days   Ashaunti Treptow M.D. [facility] 05/19/2014, 11:17 AM Pager: [phone]  If 7PM-7AM, please contact night-coverage [email or web address] Password [email or web address]

## 2014-05-19 NOTE — Progress Notes (Signed)
PT Cancellation Note  Patient Details Name: SALIA CANGEMI MRN: [id number] DOB: [birth year]   Cancelled Treatment:    Reason Eval/Treat Not Completed: Patient not medically ready Patient admitted with chest pain, cardiac enzymes ordered. No results yet. Will hold at this time and continue to follow for PT evaluation.  Ellouise Newer 05/19/2014, 8:24 AM Elayne Snare, [place]

## 2014-05-19 NOTE — Care Management Note (Unsigned)
    Page 1 of 1   05/20/2014     3:32:46 PM CARE MANAGEMENT NOTE 05/20/2014  Patient:  [name] Terry, [name] Terry   Account Number:  [id number]  Date Initiated:  05/19/2014  Documentation initiated by:  GRAVES-BIGELOW,Isabell Bonafede  Subjective/Objective Assessment:   Pt admitted for cp. Hx CAD.     Action/Plan:   CM will monitor for disposition needs.   Anticipated DC Date:  05/20/2014   Anticipated DC Plan:  [place]  CM consult      Choice offered to / List presented to:             Status of service:  In process, will continue to follow Medicare Important Message given?  YES (If response is "NO", the following Medicare IM given date fields will be blank) Date Medicare IM given:  05/20/2014 Medicare IM given by:  GRAVES-BIGELOW,Laroy Mustard Date Additional Medicare IM given:   Additional Medicare IM given by:    Discharge Disposition:    Per UR Regulation:  Reviewed for med. necessity/level of care/duration of stay  If discussed at [place] of Stay Meetings, dates discussed:    Comments:

## 2014-05-19 NOTE — Progress Notes (Signed)
  Echocardiogram 2D Echocardiogram has been performed.  [name] Terry M 05/19/2014, 10:15 AM

## 2014-05-19 NOTE — Evaluation (Signed)
Occupational Therapy Evaluation and Discharge Patient Details Name: [name] Terry MRN: [id number] DOB: 1950-06-23 Today's Date: 05/19/2014    History of Present Illness [name] Terry is a 64 y.o. female with past medical history of coronary artery disease, post status of stent placement, CHF, hypertension, hyperlipidemia, AICD, diabetes mellitus, chronic kidney disease-stage III, who presents with chest pain.PmHx: DM, HTN, CVA, MI, CKD, CHF,Polymyalgia rheumatica   Clinical Impression   This 64 yo female admitted with above presents to acute OT at an independent level with all mobility and BADLs, no needs identified. Pt did exhibit on 4-5 occassions while ambulating around the unit runs of v-tach (nursing aware).    Follow Up Recommendations  No OT follow up    Equipment Recommendations  None recommended by OT       Precautions / Restrictions Precautions Precaution Comments: defibrilator that fired recently Restrictions Weight Bearing Restrictions: No      Mobility Bed Mobility Overal bed mobility: Independent                Transfers Overall transfer level: Independent               General transfer comment: 200 feet without AD    Balance Overall balance assessment: Independent                                          ADL Overall ADL's : Independent                                                       Pertinent Vitals/Pain Pain Assessment: No/denies pain     Hand Dominance Right   Extremity/Trunk Assessment Upper Extremity Assessment Upper Extremity Assessment: Overall WFL for tasks assessed   Lower Extremity Assessment Lower Extremity Assessment: Overall WFL for tasks assessed       Communication Communication Communication: No difficulties   Cognition Arousal/Alertness: Awake/alert Behavior During Therapy: WFL for tasks assessed/performed Overall Cognitive Status: Within Functional Limits for  tasks assessed                                Home Living Family/patient expects to be discharged to:: Private residence Living Arrangements: Alone Available Help at Discharge: Family                   Bathroom Toilet: Standard     Home Equipment: None          Prior Functioning/Environment Level of Independence: Independent             OT Diagnosis: Generalized weakness         OT Goals(Current goals can be found in the care plan section) Acute Rehab OT Goals Patient Stated Goal: figure out why my difibrilator fired  OT Frequency:                End of Session Equipment Utilized During Treatment:  (none)  Activity Tolerance: Patient tolerated treatment well Patient left: in bed;with call bell/phone within reach   Time: 1450-1517 OT Time Calculation (min): 27 min Charges:  OT General Charges $OT Visit: 1 Procedure OT Evaluation $Initial OT Evaluation Tier I: 1 Procedure  OT Treatments $Self Care/Home Management : 8-22 mins  Almon Register [phone] 05/19/2014, 3:32 PM

## 2014-05-19 NOTE — H&P (Signed)
[facility] History and Physical  [name] Terry ZOX:[id number] DOB: October 06, 1950 DOA: 05/19/2014  Referring physician: ED physician PCP: [name] Curb, PA-C  Specialists:   Chief Complaint: chest pain  HPI: [name] Terry is a 64 y.o. female with past medical history of coronary artery disease, post status of stent placement, CHF, hypertension, hyperlipidemia, AICD, diabetes mellitus, chronic kidney disease-stage III, who presents with chest pain.   Patient reports that because she had chest pain and AICD went off, she was admitted to [facility] on last Friday and discharged on Sunday. Patient has a very memory,  she could not remember the detail of treatment in [facility]. According to the documentation from [facility], patient had chest pain again which started at 2 PM yesterday. Her chest that was persistent and pressure-like pain. Her chest pain was not alleviated or aggravated by any known factors. It was described as 5 out of 10 in severity. Patient was treated by EMS with aspirin, nitroglycerin with partial relief. Per patient's request, she was transferred to [facility] hospital for further evaluation and treatment. When I saw patient on the floor, she is chest pain-free. Per nurse, pateint had transient v-tach, but AICD did not fire up. Patient denies fever, chills, headaches, cough, abdominal pain, diarrhea, constipation, dysuria, urgency, frequency, hematuria, skin rashes or leg swelling. No unilateral weakness, numbness or tingling sensations. No vision change or hearing loss.  In ED of [facility], patient was found to have negative Troponin, No leukocytosis, BNP 238. Chest x-ray was negative for acute abnormalities. BMP is normal except for creatinine 1.6 and BUN 26. EKG showed T-wave flattening in inferior leads and mildly inverted T in V4-V6. Patient is admitted to inpatient for further evaluation and treatment.  Review of Systems: As presented in the  history of presenting illness, rest negative.  Where does patient live? At home Can patient participate in ADLs? little  Allergy:  Allergies  Allergen Reactions  . Potassium-Containing Compounds Other (See Comments)    Headaches  . Contrast Media [Iodinated Diagnostic Agents] Rash  . Flagyl [Metronidazole] Swelling and Rash    Face swells    Past Medical History  Diagnosis Date  . Heart disease   . History of stroke 72  . Myocardial infarction   . Lymphoma   . CHF (congestive heart failure)   . HTN (hypertension)   . HLD (hyperlipidemia)   . Kidney disease   . Ventricular tachycardia     CL 300 msec requiring ICD shocks therpay 5/14  . DM2 (diabetes mellitus, type 2), newly diagnosed 05/05/2013  . CKD (chronic kidney disease) stage 3, GFR 30-59 ml/min 05/05/2013  . Polymyalgia rheumatica     Past Surgical History  Procedure Laterality Date  . Cardiac defibrillator placement  03/2011    MDT ICD implanted at [facility] by Dr Tana Coast  . Coronary angioplasty with stent placement    . Pacemaker insertion    . Left and right heart catheterization with coronary angiogram N/A 05/03/2013    Procedure: LEFT AND RIGHT HEART CATHETERIZATION WITH CORONARY ANGIOGRAM;  Surgeon: Burnell Blanks, MD;  Location: [facility] CATH LAB;  Service: Cardiovascular;  Laterality: N/A;    Social History:  reports that she has been smoking.  She does not have any smokeless tobacco history on file. She reports that she does not drink alcohol or use illicit drugs. She reports that she quit smoking on last Friday.  Family History:  Family History  Problem Relation Age of Onset  .  Diabetes Father   . Hypertension Father   . Heart disease Father   . Alcoholism Father   . Hypertension Mother   . Heart disease Mother   . Breast cancer Mother   . Diabetes Brother   . Diabetes Brother   . Diabetes Brother   . Diabetes Brother   . Hypertension Sister   . Heart disease Sister   . Alcoholism Sister   .  Thyroid disease Sister      Prior to Admission medications   Medication Sig Start Date End Date Taking? Authorizing Provider  amLODipine (NORVASC) 10 MG tablet Take 10 mg by mouth daily.    Historical Provider, MD  aspirin EC 81 MG tablet Take 81 mg by mouth daily.    Historical Provider, MD  atorvastatin (LIPITOR) 80 MG tablet Take 80 mg by mouth at bedtime.    Historical Provider, MD  betamethasone dipropionate (DIPROLENE) 0.05 % ointment Apply 1 application topically 3 (three) times daily as needed (dermatitis). Applies to forehead and scalp    Historical Provider, MD  clopidogrel (PLAVIX) 75 MG tablet Take 75 mg by mouth at bedtime.     Historical Provider, MD  ezetimibe (ZETIA) 10 MG tablet Take 10 mg by mouth at bedtime.     Historical Provider, MD  glipiZIDE (GLUCOTROL XL) 2.5 MG 24 hr tablet Take 2.5 mg by mouth every morning.     Historical Provider, MD  isosorbide mononitrate (IMDUR) 30 MG 24 hr tablet Take 30 mg by mouth daily.    Historical Provider, MD  lisinopril-hydrochlorothiazide (PRINZIDE,ZESTORETIC) 20-12.5 MG per tablet Take 2 tablets by mouth daily.     Historical Provider, MD  metoprolol succinate (TOPROL-XL) 50 MG 24 hr tablet Take 1 tablet (50 mg total) by mouth 2 (two) times daily. Take with or immediately following a meal. 06/02/13   Thompson Grayer, MD  Multiple Vitamin (MULTIVITAMIN WITH MINERALS) TABS Take 1 tablet by mouth at bedtime.     Historical Provider, MD  nitroGLYCERIN (NITROSTAT) 0.4 MG SL tablet Place 1 tablet (0.4 mg total) under the tongue every 5 (five) minutes x 3 doses as needed for chest pain. 05/05/13   Isaiah Serge, NP  Potassium Chloride (KLOR-CON PO) Take 10 mEq by mouth daily.    Historical Provider, MD    Physical Exam: Filed Vitals:   05/19/14 0520  BP: 134/73  Pulse: 124  Temp: 98.1 F (36.7 C)  TempSrc: Oral  Resp: 18  Height: 5\' 3"  (1.6 m)  Weight: 81.2 kg (179 lb 0.2 oz)  SpO2: 98%   General: Not in acute distress, dry mucous and  membrane HEENT:       Eyes: PERRL, EOMI, no scleral icterus       ENT: No discharge from the ears and nose, no pharynx injection, no tonsillar enlargement.        Neck: No JVD, no bruit, no mass felt. Cardiac: S1/S2, RRR, No murmurs, No gallops or rubs Pulm: Good air movement bilaterally. Clear to auscultation bilaterally. No rales, wheezing, rhonchi or rubs. Abd: Soft, nondistended, nontender, no rebound pain, no organomegaly, BS present Ext: No edema bilaterally. 2+DP/PT pulse bilaterally Musculoskeletal: No joint deformities, erythema, or stiffness, ROM full Skin: No rashes.  Neuro: Alert and oriented X3, cranial nerves II-XII grossly intact, muscle strength 5/5 in all extremeties, sensation to light touch intact. Brachial reflex 1+ bilaterally. Knee reflex 1+ bilaterally. Negative Babinski's sign. Normal finger to nose test. Psych: Patient is not psychotic, no suicidal or hemocidal  ideation.  Labs on Admission:  Basic Metabolic Panel: No results for input(s): NA, K, CL, CO2, GLUCOSE, BUN, CREATININE, CALCIUM, MG, PHOS in the last 168 hours. Liver Function Tests: No results for input(s): AST, ALT, ALKPHOS, BILITOT, PROT, ALBUMIN in the last 168 hours. No results for input(s): LIPASE, AMYLASE in the last 168 hours. No results for input(s): AMMONIA in the last 168 hours. CBC: No results for input(s): WBC, NEUTROABS, HGB, HCT, MCV, PLT in the last 168 hours. Cardiac Enzymes: No results for input(s): CKTOTAL, CKMB, CKMBINDEX, TROPONINI in the last 168 hours.  BNP (last 3 results) No results for input(s): BNP in the last 8760 hours.  ProBNP (last 3 results) No results for input(s): PROBNP in the last 8760 hours.  CBG: No results for input(s): GLUCAP in the last 168 hours.  Radiological Exams on Admission: No results found.  EKG: Independently reviewed.   Assessment/Plan Principal Problem:   Chest pain Active Problems:   History of stroke   Myocardial infarction    Lymphoma   CHF (congestive heart failure)   HTN (hypertension)   HLD (hyperlipidemia)   AICD (automatic cardioverter/defibrillator) present, dual chamber medtronic   CAD (coronary artery disease)   DM2 (diabetes mellitus, type 2), newly diagnosed   CKD (chronic kidney disease) stage 3, GFR 30-59 ml/min   Diabetes mellitus without complication  Chest pain and a coronary artery disease: Patient is s/p of stent placement. Currently she is taking aspirin and Plavix. Patient has recurrent chest pain. Chest x-ray is negative for acute abnormalities. Currently chest pain-free. She has significant risk factors, it is important to rule out ACS.  - will admit to Tele bed  - cycle CE q6 x3 and repeat her EKG in the am  - Nitroglycerin, Morphine, and aspirin, lipitor and metoprolol - Risk factor stratification: will check FLP and A1C  - Consider cardiology consult if test positive for CEs  - 2d echo  Diabetes mellitus: Patient is taking glipizide at home. A1c was 6.3 on 05/02/13 -Sliding-scale insulin  -check A1c  CKD-III: Creatinine was 1.7 on 06/09/13. Her creatinine is 1.6 on admission, which is likely close to the baseline. BUN 26/creatinine 1.6, ratio is consistent with dehydration -IV fluids: Normal saline 500 mL bolus, followed by 125 mL per hour -Follow-up renal function by CMP  Hyperlipidemia: LDL was 56 on 05/03/13 -Continue Lipitor -Check FLP  Congestive heart failure: 2-D echo on 03/23/13 showed EF 25-30%. Patient is on Prinzide at home. She is clinically dry on admission. BNP 238. -Continue Prinzide, aspirin, metoprolol -Follow-up 2-D echo  Lymphoma: Approximately 10 years ago. Patient is post status of chemotherapy and radiation therapy. Last seen by oncologist was approximately 3 years ago per patient. No new issues.   DVT ppx: SQ Heparin      Code Status: Full code Family Communication: None at bed side.     Disposition Plan: Admit to inpatient   Date of Service 05/19/2014     Ivor Costa [facility] Pager [phone]  If 7PM-7AM, please contact night-coverage [email or web address] Password [facility] 05/19/2014, 6:25 AM

## 2014-05-20 DIAGNOSIS — R0789 Other chest pain: Principal | ICD-10-CM

## 2014-05-20 LAB — GLUCOSE, CAPILLARY
GLUCOSE-CAPILLARY: 75 mg/dL (ref 70–99)
GLUCOSE-CAPILLARY: 130 mg/dL — AB (ref 70–99)
Glucose-Capillary: 95 mg/dL (ref 70–99)
Glucose-Capillary: 136 mg/dL — ABNORMAL HIGH (ref 70–99)

## 2014-05-20 LAB — COMPREHENSIVE METABOLIC PANEL
ALT: 32 U/L (ref 0–35)
ANION GAP: 10 (ref 5–15)
AST: 28 U/L (ref 0–37)
Albumin: 3.3 g/dL — ABNORMAL LOW (ref 3.5–5.2)
Alkaline Phosphatase: 48 U/L (ref 39–117)
BUN: 26 mg/dL — ABNORMAL HIGH (ref 6–23)
CALCIUM: 9 mg/dL (ref 8.4–10.5)
CHLORIDE: 104 mmol/L (ref 96–112)
CO2: 23 mmol/L (ref 19–32)
CREATININE: 1.79 mg/dL — AB (ref 0.50–1.10)
GFR, EST AFRICAN AMERICAN: 34 mL/min — AB (ref >90)
GFR, EST NON AFRICAN AMERICAN: 29 mL/min — AB (ref >90)
Glucose, Bld: 114 mg/dL — ABNORMAL HIGH (ref 70–99)
Potassium: 3.9 mmol/L (ref 3.5–5.1)
SODIUM: 137 mmol/L (ref 135–145)
Total Bilirubin: 0.5 mg/dL (ref 0.3–1.2)
Total Protein: 6.7 g/dL (ref 6.0–8.3)

## 2014-05-20 LAB — CBC
HCT: 37.9 % (ref 36.0–46.0)
Hemoglobin: 12.9 g/dL (ref 12.0–15.0)
MCH: 30.1 pg (ref 26.0–34.0)
MCHC: 34 g/dL (ref 30.0–36.0)
MCV: 88.6 fL (ref 78.0–100.0)
PLATELETS: 183 10*3/uL (ref 150–400)
RBC: 4.28 MIL/uL (ref 3.87–5.11)
RDW: 15.9 % — AB (ref 11.5–15.5)
WBC: 8.4 10*3/uL (ref 4.0–10.5)

## 2014-05-20 LAB — LIPID PANEL
CHOL/HDL RATIO: 2.8 ratio
Cholesterol: 123 mg/dL (ref 0–200)
HDL: 44 mg/dL (ref >39)
LDL CALC: 69 mg/dL (ref 0–99)
TRIGLYCERIDES: 50 mg/dL (ref <150)
VLDL: 10 mg/dL (ref 0–40)

## 2014-05-20 LAB — T3: T3, Total: 74 ng/dL (ref 71–180)

## 2014-05-20 LAB — T4, FREE: Free T4: 1.07 ng/dL (ref 0.80–1.80)

## 2014-05-20 MED ORDER — TRAMADOL HCL 50 MG PO TABS
50.0000 mg | ORAL_TABLET | Freq: Once | ORAL | Status: AC
  Administered 2014-05-20: 50 mg via ORAL
  Filled 2014-05-20: qty 1

## 2014-05-20 NOTE — Progress Notes (Signed)
PT Cancellation Note  Patient Details Name: [name] Terry MRN: [id number] DOB: 1950/07/04   Cancelled Treatment:    Reason Eval/Treat Not Completed: PT screened, no needs identified, will sign off   Zymiere Trostle, Arrie Aran F 05/20/2014, 11:14 AM

## 2014-05-20 NOTE — Progress Notes (Signed)
Patient ID: [name] Terry  female  HUD:[id number]    DOB: 1950-09-08    DOA: 05/19/2014  PCP: Bronson Curb, PA-C   Brief history of present illness  [name] Terry is a 64 y.o. female with past medical history of coronary artery disease, post status of stent placement, CHF, hypertension, hyperlipidemia, AICD, diabetes mellitus, chronic kidney disease-stage III, who presents with chest pain.  Patient reported that because she had chest pain and AICD went off, she was admitted to [facility] on last Friday and discharged on Sunday. Patient could not remember the details of treatment in [facility]. According to the documentation from [facility], patient had chest pain again which started at 2 PM the day prior to admission here. Chest pain was described as persistent and pressure-like pain. Chest pain was not alleviated or aggravated by any known factors, 5 out of 10 in severity. Patient was treated by EMS with aspirin, nitroglycerin with partial relief. Per patient's request, she was transferred to [facility] hospital for further evaluation and treatment. Patient was chest pain-free at the time of admission. Per nurse, pateint had transient v-tach, but AICD did not fire up. In ED of [facility], patient was found to have negative Troponin, No leukocytosis, BNP 238. Chest x-ray was negative for acute abnormalities. BMP is normal except for creatinine 1.6 and BUN 26. EKG showed T-wave flattening in inferior leads and mildly inverted T in V4-V6. Patient  admitted to inpatient for further evaluation and treatment.   Assessment/Plan: Principal Problem:   Chest pain: History of CAD, AICD, ischemic cardiomyopathy, prior RCA PCI, 05/03/13 and recommended medical treatment - Chest x-ray negative for acute abnormalities, troponins negative so far - 2-D echo showed EF of 30-35% with akinesis of inferior and inferoseptal myocardium, moderate mitral regurgitation, moderate tricuspid  regurgitation (previous echo in 2014 had shown EF of 25-30%.) - Cardiology consulted, awaiting further recommendations, patient was seen by Dr. Lovena Le who deferred further decision regarding catheter ablation to Dr. Rayann Heman. Placed on amiodarone by cardiology service.  Active Problems: Ischemic cardiomyopathy with systolic chronic CHF - 2-D echo on 03/23/13 showed EF of 25-30% -DC IV fluids, continue aspirin, metoprolol, ACE inhibitor    History of stroke - Continue aspirin, Plavix, Lipitor    HTN (hypertension) - Currently stable, continue amlodipine, Imdur, lisinopril, HCTZ, Toprol-XL    HLD (hyperlipidemia) - Continue Lipitor, Zetia    AICD (automatic cardioverter/defibrillator) present, dual chamber medtronic, Ventricular tachycardia,   Cardiomyopathy, ischemic-EF 25-30% - Cardiology following, awaiting further admonitions    DM2 , uncontrolled with nephropathy, chronic kidney disease - Blood sugars controlled, continue sliding scale insulin, check hemoglobin A1c    CKD (chronic kidney disease) stage 3, GFR 30-59 ml/min - Baseline creatinine 1.7 on 06/09/13. creatinine at [facility] 1.6, BUN 26  Hypothyroidism - TSH 12.1, T3 and free T4 normal  DVT Prophylaxis: Heparin subcutaneous  Code Status: Full code  Family Communication:  Disposition:  Consultants: CardioloDr. Lovena Le  Procedures:  echo  Antibiotics:  None    Subjective: Patient seen and examined, no complains at this time  Objective: Weight change: -0.2 kg (-7.1 oz)  Intake/Output Summary (Last 24 hours) at 05/20/14 1204 Last data filed at 05/20/14 1055  Gross per 24 hour  Intake    243 ml  Output      0 ml  Net    243 ml   Blood pressure 101/60, pulse 63, temperature 98 F (36.7 C), temperature source Oral, resp. rate 18, height  5\' 3"  (1.6 m), weight 81 kg (178 lb 9.2 oz), SpO2 100 %.  Physical Exam: General: Alert and awake, oriented x3, not in any acute distress. CVS: S1-S2 clear,  2/6 MR murmur Chest: clear to auscultation bilaterally, no wheezing, rales or rhonchi, no chest wall tenderness, pacer in the left chest wall Abdomen: soft nontender, nondistended, normal bowel sounds  Extremities: no cyanosis, clubbing or edema noted bilaterally   Lab Results: Basic Metabolic Panel:  Recent Labs Lab 05/19/14 1356 05/20/14 0319  NA  --  137  K  --  3.9  CL  --  104  CO2  --  23  GLUCOSE  --  114*  BUN  --  26*  CREATININE  --  1.79*  CALCIUM  --  9.0  MG 2.5  --    Liver Function Tests:  Recent Labs Lab 05/20/14 0319  AST 28  ALT 32  ALKPHOS 48  BILITOT 0.5  PROT 6.7  ALBUMIN 3.3*   No results for input(s): LIPASE, AMYLASE in the last 168 hours. No results for input(s): AMMONIA in the last 168 hours. CBC:  Recent Labs Lab 05/20/14 0319  WBC 8.4  HGB 12.9  HCT 37.9  MCV 88.6  PLT 183   Cardiac Enzymes:  Recent Labs Lab 05/19/14 0738 05/19/14 1042 05/19/14 1949  TROPONINI <0.03 <0.03 <0.03   BNP: Invalid input(s): POCBNP CBG:  Recent Labs Lab 05/19/14 1124 05/19/14 1706 05/19/14 2108 05/20/14 0734 05/20/14 1130  GLUCAP 72 188* 105* 95 136*     Micro Results: Recent Results (from the past 240 hour(s))  MRSA PCR Screening     Status: None   Collection Time: 05/19/14  5:20 AM  Result Value Ref Range Status   MRSA by PCR NEGATIVE NEGATIVE Final    Comment:        The GeneXpert MRSA Assay (FDA approved for NASAL specimens only), is one component of a comprehensive MRSA colonization surveillance program. It is not intended to diagnose MRSA infection nor to guide or monitor treatment for MRSA infections.     Studies/Results: No results found.  Medications: Scheduled Meds: . amiodarone  200 mg Oral BID  . amLODipine  10 mg Oral Daily  . aspirin EC  81 mg Oral Daily  . atorvastatin  80 mg Oral QHS  . clopidogrel  75 mg Oral QHS  . ezetimibe  10 mg Oral QHS  . heparin  5,000 Units Subcutaneous 3 times per day    . lisinopril  40 mg Oral Daily   And  . hydrochlorothiazide  25 mg Oral Daily  . insulin aspart  0-9 Units Subcutaneous TID WC  . isosorbide mononitrate  30 mg Oral Daily  . metoprolol succinate  50 mg Oral BID  . sodium chloride  500 mL Intravenous Once  . sodium chloride  3 mL Intravenous Q12H  . sodium chloride  3 mL Intravenous Q12H   Time spent 25 minutes   LOS: 1 day   RAI,RIPUDEEP M.D. [facility] 05/20/2014, 12:04 PM Pager: [phone]  If 7PM-7AM, please contact night-coverage [email or web address] Password [email or web address]

## 2014-05-20 NOTE — Progress Notes (Signed)
SUBJECTIVE: The patient is doing well today.  At this time, she denies chest pain, shortness of breath, or any new concerns.  Device interrogation reveals 9 episodes of VT between 2/19 and 05/14/14, all terminated with ATP, cycle length 270-254msec.  She has been put on amiodarone with no further sustained ventricular arrhythmias.   Current device programming - VT zone at 150bpm with ATP then shocks, VF zone at 222 bpm with ATP before charging then shocks. Device functioning normally, lead measurements consistent with prior.  No AF, VP <0.1%, battery voltage 3.04.   CURRENT MEDICATIONS: . amiodarone  200 mg Oral BID  . amLODipine  10 mg Oral Daily  . aspirin EC  81 mg Oral Daily  . atorvastatin  80 mg Oral QHS  . clopidogrel  75 mg Oral QHS  . ezetimibe  10 mg Oral QHS  . heparin  5,000 Units Subcutaneous 3 times per day  . lisinopril  40 mg Oral Daily   And  . hydrochlorothiazide  25 mg Oral Daily  . insulin aspart  0-9 Units Subcutaneous TID WC  . isosorbide mononitrate  30 mg Oral Daily  . metoprolol succinate  50 mg Oral BID  . sodium chloride  500 mL Intravenous Once  . sodium chloride  3 mL Intravenous Q12H  . sodium chloride  3 mL Intravenous Q12H   . sodium chloride 50 mL/hr (05/19/14 1137)    OBJECTIVE: Physical Exam: Filed Vitals:   05/19/14 1935 05/20/14 0020 05/20/14 0511 05/20/14 0731  BP: 120/72 131/76 117/41 113/71  Pulse: 63  69 67  Temp: 98.1 F (36.7 C)  98 F (36.7 C)   TempSrc: Oral  Oral   Resp: 18 17 20 18   Height:      Weight:   178 lb 9.2 oz (81 kg)   SpO2: 100% 100% 100%     Intake/Output Summary (Last 24 hours) at 05/20/14 1007 Last data filed at 05/19/14 2230  Gross per 24 hour  Intake    240 ml  Output      0 ml  Net    240 ml    Telemetry reveals AP/VS, PVC's  GEN- The patient is elderly appearing, alert and oriented x 3 today.   Head- normocephalic, atraumatic Eyes-  Sclera clear, conjunctiva pink Ears- hearing  intact Oropharynx- clear Neck- supple, no JVP Lymph- no cervical lymphadenopathy Lungs- Clear to ausculation bilaterally, normal work of breathing Heart- Regular rate and rhythm, 2/6 SEM GI- soft, NT, ND, + BS Extremities- no clubbing, cyanosis, or edema   LABS: Basic Metabolic Panel:  Recent Labs  05/19/14 1356 05/20/14 0319  NA  --  137  K  --  3.9  CL  --  104  CO2  --  23  GLUCOSE  --  114*  BUN  --  26*  CREATININE  --  1.79*  CALCIUM  --  9.0  MG 2.5  --    Liver Function Tests:  Recent Labs  05/20/14 0319  AST 28  ALT 32  ALKPHOS 48  BILITOT 0.5  PROT 6.7  ALBUMIN 3.3*   CBC:  Recent Labs  05/20/14 0319  WBC 8.4  HGB 12.9  HCT 37.9  MCV 88.6  PLT 183   Cardiac Enzymes:  Recent Labs  05/19/14 0738 05/19/14 1042 05/19/14 1949  TROPONINI <0.03 <0.03 <0.03   Fasting Lipid Panel:  Recent Labs  05/20/14 0319  CHOL 123  HDL 44  LDLCALC 69  TRIG  84  CHOLHDL 2.8   Thyroid Function Tests:  Recent Labs  05/19/14 0738  TSH 12.116*    ASSESSMENT AND PLAN:  Principal Problem:   Chest pain Active Problems:   History of stroke   HTN (hypertension)   HLD (hyperlipidemia)   AICD (automatic cardioverter/defibrillator) present, dual chamber medtronic   Ventricular tachycardia   Cardiomyopathy, ischemic-EF 25-30%   CAD S/P prior RCA PCI, cath 05/03/2013- med Rx   DM2 with nephropathy   CKD (chronic kidney disease) stage 3, GFR 30-59 ml/min  1.  Ventricular tachycardia Appropriate ATP delivered for VT cycle length of 270-280 msec.   Continue Amiodarone 200mg  twice daily for 2 weeks then 1 tablet daily Could consider ablation if VT not controlled with Amiodarone No driving X6 months (pt aware) Follow up Dr Rayann Heman 4 weeks (06-20-14 at 10:45AM)  2.  Ischemic cardiomyopathy/CAD Continue Lisinopril/Metoprolol/Imdur No ischemic symptoms  3.  Chronic systolic heart failure Euvolemic on exam  4.  HTN Stable  Chanetta Marshall,  NP 05/20/2014 4:22 PM  I have seen, examined the patient, and reviewed the above assessment and plan.  Changes to above are made where necessary.  Will start Amiodarone and consider ablation if she has further VT.  Anticipate discharge to home in am.  Co Sign: Thompson Grayer, MD 05/20/2014 6:17 PM

## 2014-05-21 LAB — HEMOGLOBIN A1C
Hgb A1c MFr Bld: 5.9 % — ABNORMAL HIGH (ref 4.8–5.6)
Mean Plasma Glucose: 123 mg/dL

## 2014-05-21 LAB — GLUCOSE, CAPILLARY: Glucose-Capillary: 84 mg/dL (ref 70–99)

## 2014-05-21 MED ORDER — AMIODARONE HCL 200 MG PO TABS
200.0000 mg | ORAL_TABLET | Freq: Two times a day (BID) | ORAL | Status: DC
Start: 2014-05-21 — End: 2014-05-27

## 2014-05-21 NOTE — Discharge Summary (Signed)
Physician Discharge Summary  Patient ID: [name] Terry MRN: [id number] DOB/AGE: [id number] 63 y.o.  Admit date: 05/19/2014 Discharge date: 05/21/2014  Primary Care Physician:  Bronson Curb, PA-C  Discharge Diagnoses:    Ventricular tachycardia  . atypical Chest pain . CKD (chronic kidney disease) stage 3, GFR 30-59 ml/min . HTN (hypertension) . HLD (hyperlipidemia) . AICD (automatic cardioverter/defibrillator) present, dual chamber medtronic . Cardiomyopathy, ischemic-EF 25-30%  Consults: Cardiology, Dr. Rayann Heman   Recommendations for Outpatient Follow-up:  The patient was placed on amiodarone 200 mg twice a day for 2 weeks and then 1 tablet daily  Patient was recommended no driving for 6 months and follow up with cardiology outpatient arranged   TESTS THAT NEED FOLLOW-UP BMET, TSH and thyroid panel in 4 weeks again. TSH was 12.1 however T3 and free T4 were normal.  DIET: Heart healthy diet    Allergies:   Allergies  Allergen Reactions  . Potassium-Containing Compounds Other (See Comments)    Headaches-- reports no problems now  . Contrast Media [Iodinated Diagnostic Agents] Rash  . Flagyl [Metronidazole] Swelling and Rash    Face swells     Discharge Medications:   Medication List    TAKE these medications        amiodarone 200 MG tablet  Commonly known as:  PACERONE  Take 1 tablet (200 mg total) by mouth 2 (two) times daily. X 2 weeks, then once daily     amLODipine 10 MG tablet  Commonly known as:  NORVASC  Take 10 mg by mouth daily.     aspirin EC 81 MG tablet  Take 81 mg by mouth daily.     atorvastatin 80 MG tablet  Commonly known as:  LIPITOR  Take 80 mg by mouth at bedtime.     betamethasone dipropionate 0.05 % ointment  Commonly known as:  DIPROLENE  Apply 1 application topically 3 (three) times daily as needed (dermatitis). Applies to forehead and scalp     clopidogrel 75 MG tablet  Commonly known as:  PLAVIX  Take 75 mg by mouth  at bedtime.     ezetimibe 10 MG tablet  Commonly known as:  ZETIA  Take 10 mg by mouth at bedtime.     isosorbide mononitrate 30 MG 24 hr tablet  Commonly known as:  IMDUR  Take 30 mg by mouth daily.     KLOR-CON PO  Take 10 mEq by mouth daily.     lisinopril 20 MG tablet  Commonly known as:  PRINIVIL,ZESTRIL  Take 20 mg by mouth daily.     metoprolol succinate 50 MG 24 hr tablet  Commonly known as:  TOPROL-XL  Take 1 tablet (50 mg total) by mouth 2 (two) times daily. Take with or immediately following a meal.     multivitamin with minerals Tabs tablet  Take 1 tablet by mouth at bedtime.     nitroGLYCERIN 0.4 MG SL tablet  Commonly known as:  NITROSTAT  Place 1 tablet (0.4 mg total) under the tongue every 5 (five) minutes x 3 doses as needed for chest pain.     spironolactone 25 MG tablet  Commonly known as:  ALDACTONE  Take 12.5 mg by mouth daily.         Brief H and P: For complete details please refer to admission H and P, but in brief [name] Terry is a 64 y.o. female with past medical history of coronary artery disease, post status of stent placement, CHF, hypertension,  hyperlipidemia, AICD, diabetes mellitus, chronic kidney disease-stage III, who presents with chest pain.  Patient reported that because she had chest pain and AICD went off, she was admitted to [facility] on last Friday and discharged on Sunday. Patient could not remember the details of treatment in [facility] According to the documentation from [facility], patient had chest pain again which started at 2 PM the day prior to admission here. Chest pain was described as persistent and pressure-like pain. Chest pain was not alleviated or aggravated by any known factors, 5 out of 10 in severity. Patient was treated by EMS with aspirin, nitroglycerin with partial relief. Per patient's request, she was transferred to [facility] hospital for further evaluation and treatment. Patient was chest  pain-free at the time of admission. Per nurse, pateint had transient v-tach, but AICD did not fire up. In ED of [facility], patient was found to have negative Troponin, No leukocytosis, BNP 238. Chest x-ray was negative for acute abnormalities. BMP is normal except for creatinine 1.6 and BUN 26. EKG showed T-wave flattening in inferior leads and mildly inverted T in V4-V6. Patient admitted to inpatient for further evaluation and treatment.  Hospital Course:   Chest pain: History of CAD, AICD, ischemic cardiomyopathy, prior RCA PCI, 05/03/13  Patient was admitted for further workup. Chest x-ray was negative for acute abnormalities. Her troponins remained negative. 2-D echo was done which showed EF of 30-35% with akinesis of inferior and inferoseptal myocardium, moderate mitral regurgitation, moderate tricuspid regurgitation (previous echo in 2014 had shown EF of 25-30%.) - Cardiology was consulted, patient was seen by Dr. Lovena Le and Dr. Rayann Heman. Patient was placed on amiodarone 200 mg twice a day for 2 weeks and then once daily. Further decision regarding catheter ablation outpatient, patient has appointment arranged with Dr Rayann Heman.   Ischemic cardiomyopathy with systolic chronic CHF Currently stable and compensated. EF was 30-35% on 2-D echo done during this hospitalization which has improved from 2-D echo in 12/14 which showed EF of 25-30%. Continue aspirin, metoprolol, ACE inhibitor   History of stroke - Continue aspirin, Plavix, Lipitor   HTN (hypertension) - Currently stable, continue amlodipine, Imdur, lisinopril, HCTZ, Toprol-XL   HLD (hyperlipidemia) - Continue Lipitor, Zetia   AICD (automatic cardioverter/defibrillator) present, dual chamber medtronic, Ventricular tachycardia, Cardiomyopathy, ischemic-EF 25-30%   DM2 , uncontrolled with nephropathy, chronic kidney disease - Blood sugars controlled, continue sliding scale insulin, hemoglobin A1c 5.9   CKD (chronic kidney  disease) stage 3, GFR 30-59 ml/min - Baseline creatinine 1.7 on 06/09/13. creatinine at [facility] 1.6, BUN 26, creatinine at baseline  Hypothyroidism - TSH 12.1, T3 and free T4 normal, follow-up patient for PCP  Day of Discharge BP 109/72 mmHg  Pulse 59  Temp(Src) 97.9 F (36.6 C) (Oral)  Resp 16  Ht 5\' 3"  (1.6 m)  Wt 80.2 kg (176 lb 12.9 oz)  BMI 31.33 kg/m2  SpO2 100%  Physical Exam: General: Alert and awake oriented x3 not in any acute distress. HEENT: anicteric sclera, pupils reactive to light and accommodation CVS: S1-S2 clear no murmur rubs or gallops Chest: clear to auscultation bilaterally, no wheezing rales or rhonchi Abdomen: soft nontender, nondistended, normal bowel sounds Extremities: no cyanosis, clubbing or edema noted bilaterally Neuro: Cranial nerves II-XII intact, no focal neurological deficits   The results of significant diagnostics from this hospitalization (including imaging, microbiology, ancillary and laboratory) are listed below for reference.    LAB RESULTS: Basic Metabolic Panel:  Recent Labs Lab 05/19/14 1356 05/20/14  0319  NA  --  137  K  --  3.9  CL  --  104  CO2  --  23  GLUCOSE  --  114*  BUN  --  26*  CREATININE  --  1.79*  CALCIUM  --  9.0  MG 2.5  --    Liver Function Tests:  Recent Labs Lab 05/20/14 0319  AST 28  ALT 32  ALKPHOS 48  BILITOT 0.5  PROT 6.7  ALBUMIN 3.3*   No results for input(s): LIPASE, AMYLASE in the last 168 hours. No results for input(s): AMMONIA in the last 168 hours. CBC:  Recent Labs Lab 05/20/14 0319  WBC 8.4  HGB 12.9  HCT 37.9  MCV 88.6  PLT 183   Cardiac Enzymes:  Recent Labs Lab 05/19/14 1042 05/19/14 1949  TROPONINI <0.03 <0.03   BNP: Invalid input(s): POCBNP CBG:  Recent Labs Lab 05/20/14 2119 05/21/14 0734  GLUCAP 130* 84    Significant Diagnostic Studies:  No results found.  2D ECHO: Study Conclusions  - Left ventricle: The cavity size was normal.  There was mild focal basal hypertrophy of the septum. Systolic function was moderately to severely reduced. The estimated ejection fraction was in the range of 30% to 35%. There is akinesis of the inferior and inferoseptal myocardium. - Mitral valve: There was moderate regurgitation. - Left atrium: The atrium was mildly dilated. - Tricuspid valve: There was moderate regurgitation. - Pulmonary arteries: Systolic pressure was mildly to moderately increased. PA peak pressure: 43 mm Hg (S).  Impressions:  - Compared to the prior study, there has been no significant interval change.  Disposition and Follow-up: Discharge Instructions    Diet - low sodium heart healthy    Complete by:  As directed      Discharge instructions    Complete by:  As directed   Please take amiodarone 200mg  twice a day for 2 weeks, then once daily  No driving for 6 months.     Increase activity slowly    Complete by:  As directed             DISPOSITION: home   DISCHARGE FOLLOW-UP     Follow-up Information    Follow up with Thompson Grayer, MD On 06/20/2014.   Specialty:  Cardiology   Why:  at 10:45 AM   Contact information:   [place] [address] [phone]       Follow up with ROBERTSON, ANTHONY T, PA-C. Schedule an appointment as soon as possible for a visit in 2 weeks.   Specialty:  Physician Assistant   Why:  for hospital follow-up   Contact information:   [address] [place] [address] [phone]        Time spent on Discharge: 35 mins  Signed:   RAI,RIPUDEEP M.D. [facility] 05/21/2014, 8:38 AM Pager: [phone]

## 2014-05-21 NOTE — Progress Notes (Signed)
SUBJECTIVE: The patient is doing well today.  At this time, she denies chest pain, shortness of breath, or any new concerns. No arrhythmias overnight  CURRENT MEDICATIONS: . amiodarone  200 mg Oral BID  . amLODipine  10 mg Oral Daily  . aspirin EC  81 mg Oral Daily  . atorvastatin  80 mg Oral QHS  . clopidogrel  75 mg Oral QHS  . ezetimibe  10 mg Oral QHS  . heparin  5,000 Units Subcutaneous 3 times per day  . lisinopril  40 mg Oral Daily   And  . hydrochlorothiazide  25 mg Oral Daily  . insulin aspart  0-9 Units Subcutaneous TID WC  . isosorbide mononitrate  30 mg Oral Daily  . metoprolol succinate  50 mg Oral BID  . sodium chloride  500 mL Intravenous Once  . sodium chloride  3 mL Intravenous Q12H  . sodium chloride  3 mL Intravenous Q12H      OBJECTIVE: Physical Exam: Filed Vitals:   05/20/14 1046 05/20/14 1333 05/20/14 2123 05/21/14 0553  BP: 101/60 107/65 121/81 109/72  Pulse: 63 58 55 59  Temp:  97.7 F (36.5 C) 97.9 F (36.6 C) 97.9 F (36.6 C)  TempSrc:  Oral Oral Oral  Resp:  16 16 16   Height:      Weight:    176 lb 12.9 oz (80.2 kg)  SpO2:  100% 98% 100%    Intake/Output Summary (Last 24 hours) at 05/21/14 6720 Last data filed at 05/20/14 2000  Gross per 24 hour  Intake    483 ml  Output      0 ml  Net    483 ml    Telemetry reveals AP/VS, PVC's  GEN- The patient is elderly appearing, alert and oriented x 3 today.   Head- normocephalic, atraumatic Eyes-  Sclera clear, conjunctiva pink Ears- hearing intact Oropharynx- clear Neck- supple, no JVP Lymph- no cervical lymphadenopathy Lungs- Clear to ausculation bilaterally, normal work of breathing Heart- Regular rate and rhythm, 2/6 SEM GI- soft, NT, ND, + BS Extremities- no clubbing, cyanosis, or edema   LABS: Basic Metabolic Panel:  Recent Labs  05/19/14 1356 05/20/14 0319  NA  --  137  K  --  3.9  CL  --  104  CO2  --  23  GLUCOSE  --  114*  BUN  --  26*  CREATININE  --  1.79*   CALCIUM  --  9.0  MG 2.5  --    Liver Function Tests:  Recent Labs  05/20/14 0319  AST 28  ALT 32  ALKPHOS 48  BILITOT 0.5  PROT 6.7  ALBUMIN 3.3*   CBC:  Recent Labs  05/20/14 0319  WBC 8.4  HGB 12.9  HCT 37.9  MCV 88.6  PLT 183   Cardiac Enzymes:  Recent Labs  05/19/14 0738 05/19/14 1042 05/19/14 1949  TROPONINI <0.03 <0.03 <0.03   Fasting Lipid Panel:  Recent Labs  05/20/14 0319  CHOL 123  HDL 44  LDLCALC 69  TRIG 50  CHOLHDL 2.8   Thyroid Function Tests:  Recent Labs  05/19/14 0738  TSH 12.116*    ASSESSMENT AND PLAN:   1.  Ventricular tachycardia Continue Amiodarone 200mg  twice daily for 2 weeks then 1 tablet daily No driving X6 months (pt aware) Follow up Dr Rayann Heman 4 weeks (06-20-14 at 10:45AM)  2.  Ischemic cardiomyopathy/CAD Continue Lisinopril/Metoprolol/Imdur No ischemic symptoms  3.  Chronic systolic heart failure Euvolemic  on exam  4.  HTN Stable  Ok to discharge to home today  Electrophysiology team to see as needed while here. Please call with questions.   Thompson Grayer, MD 05/21/2014 7:52 AM

## 2014-05-25 ENCOUNTER — Ambulatory Visit (INDEPENDENT_AMBULATORY_CARE_PROVIDER_SITE_OTHER): Payer: Medicare Other | Admitting: *Deleted

## 2014-05-25 ENCOUNTER — Encounter (HOSPITAL_COMMUNITY): Payer: Self-pay | Admitting: General Practice

## 2014-05-25 ENCOUNTER — Encounter: Payer: Self-pay | Admitting: Physician Assistant

## 2014-05-25 ENCOUNTER — Ambulatory Visit (INDEPENDENT_AMBULATORY_CARE_PROVIDER_SITE_OTHER): Payer: Medicare Other | Admitting: Physician Assistant

## 2014-05-25 ENCOUNTER — Telehealth: Payer: Self-pay | Admitting: Internal Medicine

## 2014-05-25 ENCOUNTER — Inpatient Hospital Stay (HOSPITAL_COMMUNITY)
Admission: AD | Admit: 2014-05-25 | Discharge: 2014-05-27 | DRG: 274 | Disposition: A | Discharge status: Home / Self Care | Payer: Medicare Other | Source: Ambulatory Visit | Attending: Cardiology | Admitting: Cardiology

## 2014-05-25 ENCOUNTER — Encounter: Payer: Self-pay | Admitting: Internal Medicine

## 2014-05-25 VITALS — BP 140/80 | HR 67 | Ht 63.0 in | Wt 185.0 lb

## 2014-05-25 DIAGNOSIS — Z811 Family history of alcohol abuse and dependence: Secondary | ICD-10-CM | POA: Diagnosis not present

## 2014-05-25 DIAGNOSIS — I25118 Atherosclerotic heart disease of native coronary artery with other forms of angina pectoris: Secondary | ICD-10-CM | POA: Insufficient documentation

## 2014-05-25 DIAGNOSIS — I493 Ventricular premature depolarization: Secondary | ICD-10-CM | POA: Diagnosis not present

## 2014-05-25 DIAGNOSIS — I2510 Atherosclerotic heart disease of native coronary artery without angina pectoris: Secondary | ICD-10-CM | POA: Diagnosis present

## 2014-05-25 DIAGNOSIS — Z8673 Personal history of transient ischemic attack (TIA), and cerebral infarction without residual deficits: Secondary | ICD-10-CM

## 2014-05-25 DIAGNOSIS — F17210 Nicotine dependence, cigarettes, uncomplicated: Secondary | ICD-10-CM | POA: Diagnosis present

## 2014-05-25 DIAGNOSIS — Z95810 Presence of automatic (implantable) cardiac defibrillator: Secondary | ICD-10-CM

## 2014-05-25 DIAGNOSIS — Z91041 Radiographic dye allergy status: Secondary | ICD-10-CM | POA: Diagnosis not present

## 2014-05-25 DIAGNOSIS — N183 Chronic kidney disease, stage 3 unspecified: Secondary | ICD-10-CM | POA: Diagnosis present

## 2014-05-25 DIAGNOSIS — M353 Polymyalgia rheumatica: Secondary | ICD-10-CM | POA: Diagnosis present

## 2014-05-25 DIAGNOSIS — Z823 Family history of stroke: Secondary | ICD-10-CM

## 2014-05-25 DIAGNOSIS — I472 Ventricular tachycardia, unspecified: Secondary | ICD-10-CM

## 2014-05-25 DIAGNOSIS — Z7902 Long term (current) use of antithrombotics/antiplatelets: Secondary | ICD-10-CM

## 2014-05-25 DIAGNOSIS — E785 Hyperlipidemia, unspecified: Secondary | ICD-10-CM | POA: Diagnosis present

## 2014-05-25 DIAGNOSIS — Z79899 Other long term (current) drug therapy: Secondary | ICD-10-CM

## 2014-05-25 DIAGNOSIS — I200 Unstable angina: Secondary | ICD-10-CM

## 2014-05-25 DIAGNOSIS — R079 Chest pain, unspecified: Secondary | ICD-10-CM | POA: Diagnosis present

## 2014-05-25 DIAGNOSIS — I252 Old myocardial infarction: Secondary | ICD-10-CM | POA: Diagnosis not present

## 2014-05-25 DIAGNOSIS — I10 Essential (primary) hypertension: Secondary | ICD-10-CM | POA: Diagnosis present

## 2014-05-25 DIAGNOSIS — I255 Ischemic cardiomyopathy: Secondary | ICD-10-CM

## 2014-05-25 DIAGNOSIS — I129 Hypertensive chronic kidney disease with stage 1 through stage 4 chronic kidney disease, or unspecified chronic kidney disease: Secondary | ICD-10-CM | POA: Diagnosis present

## 2014-05-25 DIAGNOSIS — Z955 Presence of coronary angioplasty implant and graft: Secondary | ICD-10-CM

## 2014-05-25 DIAGNOSIS — Z7982 Long term (current) use of aspirin: Secondary | ICD-10-CM

## 2014-05-25 DIAGNOSIS — R0602 Shortness of breath: Secondary | ICD-10-CM

## 2014-05-25 DIAGNOSIS — E119 Type 2 diabetes mellitus without complications: Secondary | ICD-10-CM | POA: Diagnosis present

## 2014-05-25 DIAGNOSIS — Z888 Allergy status to other drugs, medicaments and biological substances status: Secondary | ICD-10-CM | POA: Diagnosis not present

## 2014-05-25 DIAGNOSIS — Z8249 Family history of ischemic heart disease and other diseases of the circulatory system: Secondary | ICD-10-CM

## 2014-05-25 DIAGNOSIS — N189 Chronic kidney disease, unspecified: Secondary | ICD-10-CM

## 2014-05-25 DIAGNOSIS — Z833 Family history of diabetes mellitus: Secondary | ICD-10-CM

## 2014-05-25 HISTORY — DX: Depression, unspecified: F32.A

## 2014-05-25 HISTORY — DX: Major depressive disorder, single episode, unspecified: F32.9

## 2014-05-25 HISTORY — DX: Gastro-esophageal reflux disease without esophagitis: K21.9

## 2014-05-25 LAB — MDC_IDC_ENUM_SESS_TYPE_INCLINIC
Battery Voltage: 3.02 V
Brady Statistic AP VS Percent: 85.35 %
Brady Statistic RA Percent Paced: 85.4 %
Brady Statistic RV Percent Paced: 0.05 %
HIGH POWER IMPEDANCE MEASURED VALUE: 190 Ohm
HIGH POWER IMPEDANCE MEASURED VALUE: 361 Ohm
HighPow Impedance: 63 Ohm
Lead Channel Impedance Value: 418 Ohm
Lead Channel Impedance Value: 475 Ohm
Lead Channel Pacing Threshold Amplitude: 0.75 V
Lead Channel Pacing Threshold Amplitude: 0.875 V
Lead Channel Pacing Threshold Pulse Width: 0.4 ms
Lead Channel Pacing Threshold Pulse Width: 0.4 ms
Lead Channel Sensing Intrinsic Amplitude: 3 mV
Lead Channel Sensing Intrinsic Amplitude: 3.375 mV
Lead Channel Sensing Intrinsic Amplitude: 4.625 mV
Lead Channel Sensing Intrinsic Amplitude: 4.75 mV
Lead Channel Setting Pacing Amplitude: 2 V
Lead Channel Setting Pacing Pulse Width: 0.4 ms
Lead Channel Setting Sensing Sensitivity: 0.3 mV
MDC IDC SESS DTM: 20160302140312
MDC IDC SET LEADCHNL RV PACING AMPLITUDE: 2.5 V
MDC IDC SET ZONE DETECTION INTERVAL: 270 ms
MDC IDC SET ZONE DETECTION INTERVAL: 280 ms
MDC IDC STAT BRADY AP VP PERCENT: 0.05 %
MDC IDC STAT BRADY AS VP PERCENT: 0 %
MDC IDC STAT BRADY AS VS PERCENT: 14.6 %
Zone Setting Detection Interval: 350 ms
Zone Setting Detection Interval: 440 ms
Zone Setting Detection Interval: 470 ms

## 2014-05-25 LAB — TROPONIN I: Troponin I: <0.03 ng/mL (ref <0.031)

## 2014-05-25 LAB — CBC WITH DIFFERENTIAL/PLATELET
Basophils Absolute: 0 10*3/uL (ref 0.0–0.1)
Basophils Relative: 0 % (ref 0–1)
Eosinophils Absolute: 0.1 10*3/uL (ref 0.0–0.7)
Eosinophils Relative: 2 % (ref 0–5)
HCT: 36.4 % (ref 36.0–46.0)
Hemoglobin: 12.3 g/dL (ref 12.0–15.0)
LYMPHS PCT: 28 % (ref 12–46)
Lymphs Abs: 2.2 10*3/uL (ref 0.7–4.0)
MCH: 29.9 pg (ref 26.0–34.0)
MCHC: 33.8 g/dL (ref 30.0–36.0)
MCV: 88.6 fL (ref 78.0–100.0)
Monocytes Absolute: 0.9 10*3/uL (ref 0.1–1.0)
Monocytes Relative: 12 % (ref 3–12)
NEUTROS ABS: 4.4 10*3/uL (ref 1.7–7.7)
Neutrophils Relative %: 58 % (ref 43–77)
PLATELETS: 181 10*3/uL (ref 150–400)
RBC: 4.11 MIL/uL (ref 3.87–5.11)
RDW: 16 % — ABNORMAL HIGH (ref 11.5–15.5)
WBC: 7.6 10*3/uL (ref 4.0–10.5)

## 2014-05-25 LAB — COMPREHENSIVE METABOLIC PANEL
ALBUMIN: 3.8 g/dL (ref 3.5–5.2)
ALT: 49 U/L — ABNORMAL HIGH (ref 0–35)
ANION GAP: 10 (ref 5–15)
AST: 43 U/L — ABNORMAL HIGH (ref 0–37)
Alkaline Phosphatase: 51 U/L (ref 39–117)
BILIRUBIN TOTAL: 0.6 mg/dL (ref 0.3–1.2)
BUN: 32 mg/dL — ABNORMAL HIGH (ref 6–23)
CO2: 21 mmol/L (ref 19–32)
CREATININE: 2.16 mg/dL — AB (ref 0.50–1.10)
Calcium: 9.2 mg/dL (ref 8.4–10.5)
Chloride: 105 mmol/L (ref 96–112)
GFR calc non Af Amer: 23 mL/min — ABNORMAL LOW (ref >90)
GFR, EST AFRICAN AMERICAN: 27 mL/min — AB (ref >90)
Glucose, Bld: 86 mg/dL (ref 70–99)
Potassium: 4.6 mmol/L (ref 3.5–5.1)
SODIUM: 136 mmol/L (ref 135–145)
Total Protein: 6.8 g/dL (ref 6.0–8.3)

## 2014-05-25 LAB — APTT: APTT: 30 s (ref 24–37)

## 2014-05-25 LAB — GLUCOSE, CAPILLARY: GLUCOSE-CAPILLARY: 76 mg/dL (ref 70–99)

## 2014-05-25 LAB — PROTIME-INR
INR: 1.17 (ref 0.00–1.49)
Prothrombin Time: 15 seconds (ref 11.6–15.2)

## 2014-05-25 MED ORDER — ADULT MULTIVITAMIN W/MINERALS CH
1.0000 | ORAL_TABLET | Freq: Every day | ORAL | Status: DC
  Administered 2014-05-25 – 2014-05-26 (×2): 1 via ORAL
  Filled 2014-05-25 (×4): qty 1

## 2014-05-25 MED ORDER — SODIUM CHLORIDE 0.45 % IV SOLN
INTRAVENOUS | Status: DC
  Administered 2014-05-25: 17:00:00 via INTRAVENOUS

## 2014-05-25 MED ORDER — LISINOPRIL 20 MG PO TABS
20.0000 mg | ORAL_TABLET | Freq: Every day | ORAL | Status: DC
  Administered 2014-05-27: 20 mg via ORAL
  Filled 2014-05-25 (×2): qty 1

## 2014-05-25 MED ORDER — AMLODIPINE BESYLATE 10 MG PO TABS
10.0000 mg | ORAL_TABLET | Freq: Every day | ORAL | Status: DC
  Administered 2014-05-27: 10 mg via ORAL
  Filled 2014-05-25 (×2): qty 1

## 2014-05-25 MED ORDER — TRIAMCINOLONE ACETONIDE 0.5 % EX OINT
TOPICAL_OINTMENT | Freq: Three times a day (TID) | CUTANEOUS | Status: DC | PRN
  Filled 2014-05-25: qty 15

## 2014-05-25 MED ORDER — ATORVASTATIN CALCIUM 80 MG PO TABS
80.0000 mg | ORAL_TABLET | Freq: Every day | ORAL | Status: DC
  Administered 2014-05-25 – 2014-05-26 (×2): 80 mg via ORAL
  Filled 2014-05-25 (×4): qty 1

## 2014-05-25 MED ORDER — POTASSIUM CHLORIDE ER 10 MEQ PO TBCR
10.0000 meq | EXTENDED_RELEASE_TABLET | Freq: Every day | ORAL | Status: DC
  Administered 2014-05-27: 10 meq via ORAL
  Filled 2014-05-25 (×2): qty 1

## 2014-05-25 MED ORDER — ISOSORBIDE MONONITRATE ER 60 MG PO TB24
60.0000 mg | ORAL_TABLET | Freq: Every day | ORAL | Status: DC
  Administered 2014-05-27: 60 mg via ORAL
  Filled 2014-05-25 (×2): qty 1

## 2014-05-25 MED ORDER — METOPROLOL SUCCINATE ER 50 MG PO TB24
50.0000 mg | ORAL_TABLET | Freq: Two times a day (BID) | ORAL | Status: DC
  Filled 2014-05-25: qty 1

## 2014-05-25 MED ORDER — ASPIRIN EC 81 MG PO TBEC
81.0000 mg | DELAYED_RELEASE_TABLET | Freq: Every day | ORAL | Status: DC
  Administered 2014-05-27: 81 mg via ORAL
  Filled 2014-05-25 (×2): qty 1

## 2014-05-25 MED ORDER — ACETAMINOPHEN 325 MG PO TABS: 650.0000 mg | ORAL_TABLET | ORAL | Status: DC | PRN

## 2014-05-25 MED ORDER — HEPARIN BOLUS VIA INFUSION
4000.0000 [IU] | Freq: Once | INTRAVENOUS | Status: AC
  Administered 2014-05-25: 4000 [IU] via INTRAVENOUS
  Filled 2014-05-25: qty 4000

## 2014-05-25 MED ORDER — NITROGLYCERIN 0.4 MG SL SUBL: 0.4000 mg | SUBLINGUAL_TABLET | SUBLINGUAL | Status: DC | PRN

## 2014-05-25 MED ORDER — CLOPIDOGREL BISULFATE 75 MG PO TABS
75.0000 mg | ORAL_TABLET | Freq: Every day | ORAL | Status: DC
  Administered 2014-05-25 – 2014-05-26 (×2): 75 mg via ORAL
  Filled 2014-05-25 (×4): qty 1

## 2014-05-25 MED ORDER — ONDANSETRON HCL 4 MG/2ML IJ SOLN: 4.0000 mg | Freq: Four times a day (QID) | INTRAMUSCULAR | Status: DC | PRN

## 2014-05-25 MED ORDER — SODIUM CHLORIDE 0.9 % IV SOLN: INTRAVENOUS | Status: DC

## 2014-05-25 MED ORDER — EZETIMIBE 10 MG PO TABS
10.0000 mg | ORAL_TABLET | Freq: Every day | ORAL | Status: DC
  Administered 2014-05-25 – 2014-05-26 (×2): 10 mg via ORAL
  Filled 2014-05-25 (×4): qty 1

## 2014-05-25 MED ORDER — HEPARIN (PORCINE) IN NACL 100-0.45 UNIT/ML-% IJ SOLN
850.0000 [IU]/h | INTRAMUSCULAR | Status: DC
  Administered 2014-05-25: 850 [IU]/h via INTRAVENOUS
  Filled 2014-05-25: qty 250

## 2014-05-25 MED ORDER — DIPHENHYDRAMINE HCL 25 MG PO CAPS
25.0000 mg | ORAL_CAPSULE | Freq: Every evening | ORAL | Status: DC | PRN
  Administered 2014-05-25 – 2014-05-27 (×2): 25 mg via ORAL
  Filled 2014-05-25 (×2): qty 1

## 2014-05-25 MED ORDER — AMIODARONE HCL 200 MG PO TABS
400.0000 mg | ORAL_TABLET | Freq: Two times a day (BID) | ORAL | Status: DC
  Filled 2014-05-25: qty 2

## 2014-05-25 MED ORDER — SPIRONOLACTONE 12.5 MG HALF TABLET
12.5000 mg | ORAL_TABLET | Freq: Every day | ORAL | Status: DC
  Administered 2014-05-27: 12.5 mg via ORAL
  Filled 2014-05-25 (×2): qty 1

## 2014-05-25 NOTE — Telephone Encounter (Signed)
Called by [facility] ER regarding [name].  She presented late last night with a hemodynamically stable VT ~150 bpm.  She felt some chest discomfort during this episode.  Episode broke with 150 IV amio bolus and device was interrogated by a [employer] Rep who reported that she had two episodes of VT over the past few days.  The first was asymptomatic but did not reach 150 bpm threshold.  Second episode was tonight and lasted 30 minutes and at times reached 150 bpm, triggering 3 attempts at ATP, all unsuccessful. The [employer] rep decreased her ATP threshold to 136 bpm.  [name] Terry would like to go home from ER as she is curently asymptomatic.  Have instructed [place] ER to double amio dose to 400 BID and have [name] call Dr. Jackalyn Lombard office this morning for urgent follow up.  Attempted to contact EP APP to update, but unclear if pager is covered at 5:30 this am.  Terressa Koyanagi, MD Cardiology Fellow

## 2014-05-25 NOTE — Progress Notes (Signed)
ANTICOAGULATION CONSULT NOTE - Initial Consult  Pharmacy Consult for Heparin Indication: chest pain/ACS  Allergies  Allergen Reactions  . Flagyl [Metronidazole] Swelling and Rash    Not Assessed Face swells Face swells  . Potassium-Containing Compounds Other (See Comments)    Headaches-- reports no problems now  . Contrast Media [Iodinated Diagnostic Agents] Rash  . Ioxaglate Rash  . Potassium Sulfate Rash    Headaches    Patient Measurements:   Heparin Dosing Weight: 71 kg  Vital Signs: Temp: 98 F (36.7 C) (03/02 1447) Temp Source: Oral (03/02 1447) BP: 109/72 mmHg (03/02 1447) Pulse Rate: 61 (03/02 1447)  Labs: No results for input(s): HGB, HCT, PLT, APTT, LABPROT, INR, HEPARINUNFRC, CREATININE, CKTOTAL, CKMB, TROPONINI in the last 72 hours.  Estimated Creatinine Clearance: 33.4 mL/min (by C-G formula based on Cr of 1.79).   Medical History: Past Medical History  Diagnosis Date  . Heart disease   . History of stroke 40  . Myocardial infarction   . Lymphoma   . CHF (congestive heart failure)   . HTN (hypertension)   . HLD (hyperlipidemia)   . Kidney disease   . Ventricular tachycardia     CL 300 msec requiring ICD shocks therpay 5/14  . DM2 (diabetes mellitus, type 2), newly diagnosed 05/05/2013  . CKD (chronic kidney disease) stage 3, GFR 30-59 ml/min 05/05/2013  . Polymyalgia rheumatica     Medications:  Prescriptions prior to admission  Medication Sig Dispense Refill Last Dose  . amiodarone (PACERONE) 200 MG tablet Take 1 tablet (200 mg total) by mouth 2 (two) times daily. X 2 weeks, then once daily 60 tablet 4 Taking  . amLODipine (NORVASC) 10 MG tablet Take 10 mg by mouth daily.   Taking  . aspirin EC 81 MG tablet Take 81 mg by mouth daily.   Taking  . atorvastatin (LIPITOR) 80 MG tablet Take 80 mg by mouth at bedtime.   Taking  . betamethasone dipropionate (DIPROLENE) 0.05 % ointment Apply 1 application topically 3 (three) times daily as needed  (dermatitis). Applies to forehead and scalp   Taking  . clopidogrel (PLAVIX) 75 MG tablet Take 75 mg by mouth at bedtime.    Taking  . ezetimibe (ZETIA) 10 MG tablet Take 10 mg by mouth at bedtime.    Taking  . isosorbide mononitrate (IMDUR) 30 MG 24 hr tablet Take 30 mg by mouth daily.   Taking  . lisinopril (PRINIVIL,ZESTRIL) 20 MG tablet Take 20 mg by mouth daily.   Taking  . metoprolol succinate (TOPROL-XL) 50 MG 24 hr tablet Take 1 tablet (50 mg total) by mouth 2 (two) times daily. Take with or immediately following a meal. 180 tablet 0 Taking  . Multiple Vitamin (MULTIVITAMIN WITH MINERALS) TABS Take 1 tablet by mouth at bedtime.    Taking  . Potassium Chloride (KLOR-CON PO) Take 10 mEq by mouth daily.   Taking  . spironolactone (ALDACTONE) 25 MG tablet Take 12.5 mg by mouth daily.   Taking   Scheduled:  . amiodarone  400 mg Oral BID  . [START ON 05/26/2014] amLODipine  10 mg Oral Daily  . [START ON 05/26/2014] aspirin EC  81 mg Oral Daily  . atorvastatin  80 mg Oral QHS  . clopidogrel  75 mg Oral QHS  . ezetimibe  10 mg Oral QHS  . [START ON 05/26/2014] isosorbide mononitrate  60 mg Oral Daily  . [START ON 05/26/2014] lisinopril  20 mg Oral Daily  . metoprolol succinate  50 mg Oral BID  . multivitamin with minerals  1 tablet Oral QHS  . [START ON 05/26/2014] potassium chloride  10 mEq Oral Daily  . [START ON 05/26/2014] spironolactone  12.5 mg Oral Daily   Infusions:  . sodium chloride      Assessment: 64yo female with history of stroke, MI, CHF, HTN, and CKD stage 3 presents with chest pain and VTach. Pharmacy is consulted to dose heparin for chest pain/ACS. CBC is wnl, Trop neg x3, sCr 1.79.  Goal of Therapy:  Heparin level 0.3-0.7 units/ml Monitor platelets by anticoagulation protocol: Yes   Plan:  Give 4000 units bolus x 1 Start heparin infusion at 850 units/hr Check anti-Xa level in 6 hours and daily while on heparin Continue to monitor H&H and platelets  Monitor s/sx of  bleeding  Andrey Cota. Diona Foley, PharmD Clinical Pharmacist Pager [phone] 05/25/2014,2:58 PM

## 2014-05-25 NOTE — Telephone Encounter (Signed)
Direct call transfer. ED visit note was reviewed. Pt would like to be seen today. History of ICM/ CAD, HTN, hyperlipidemia. EF 30-35%. Pt has AICD. Today she c/o slight cp same as felt in ED last night, denies SOB but requests to be seen. Current BP 122/92, P 59, pt was told to hold amiodarone and metoprolol till I consulted with Flex PA. She was told to take the rest of her meds. When I called her back she reviewed her meds and she took the Amiodarone 400 mg . Pt also stated she has always taken Amiodarone 400 mg BID despite what the [facility] states.  Pt was given a morning app today and was told to bring meds with her/ especially amiodarone and metoprolol.  Pt verbalized understanding of app/ time/ PA to see/ meds to bring.

## 2014-05-25 NOTE — Progress Notes (Signed)
Cardiology Office Note   Date:  05/25/2014   ID:  AHUVA POYNOR, DOB June 15, 1950, MRN [id number]  PCP:  Bronson Curb, PA-C  Cardiologist:  Dr. Jenkins Rouge   Electrophysiologist:  Dr. Thompson Grayer   Chief Complaint  Patient presents with  . Chest Pain  . Ventricular Tachy     History of Present Illness: [name] Terry is a 64 y.o. female with a hx of CAD s/p prior PCI/stent, ICM, VTach, s/p AICD (implanted at [facility]), HTN, HL, DM2, CKD, prior CVA.    Recently admitted 2/25-2/27 to [facility].  She was originally seen in [facility] with ICD discharge x 4.  She was put on Amiodarone.  She was readmitted with chest pain x 2.  She requested transfer to [facility] upon her second admission.  She had 30 beat run of VT prior to transfer from [facility].  CEs were neg at [facility].  At [facility], her ICD was interrogated.  She did receive appropriate ATP.  Amiodarone was increased x 2 weeks (400 bid >> 400 QD).  Recommendation was to consider ablation if VT not controlled.  FU with Dr. Thompson Grayer is 3/28.    She went back to the ED at [facility] yesterday with sudden onset of rapid palpitations and L sided chest pain. She was told that she was back in VT.  She was given IV Amiodarone in the ambulance.  Interrogation of her device today demonstrates 3 episodes of VT treated unsuccessfully with ATP.  HR was in the monitor zone.  Therefore she was not shocked.  She has been having several PVCs and PVC runs.    She tells me that she has been having epigastric and substernal chest pain for the last few months.  She notes this with mild to mod exertion (CCS 2-3).  She has assoc dyspnea, nausea and diaphoresis.  She tells me that this is reminiscent of her prior angina.  She denies further syncope.     Studies/Reports Reviewed Today:  Echocardiogram 05/19/14 - Mild focal basal hypertrophy of the septum. EF 30-35%. Inf and Inf-septal AK - Mod MR - Mild LAE - Mod TR - PA peak pressure: 43 mm Hg (S).  Cardiac  Cath 05/03/13 Left main: No obstructive disease.  Left Anterior Descending Artery: Diffuse 30% stenosis in the proximal, mid and distal vessel. D1 ostial 30% stenosis.  Circumflex Artery:  Diffuse 50% proximal, mid and distal vessel. OM1 30% proximal, OM2 proximal 60%, OM3 30% proximal followed by diffuse 90% distally  Right Coronary Artery:  patent mid stent, Mild plaque in proximal and distal vessel.  Impression: 1. Non-obstructive CAD with no focal targets for PCI, 2. Ischemic cardiomyopathy   Past Medical History  Diagnosis Date  . Heart disease   . History of stroke 71  . Myocardial infarction   . Lymphoma   . CHF (congestive heart failure)   . HTN (hypertension)   . HLD (hyperlipidemia)   . Kidney disease   . Ventricular tachycardia     CL 300 msec requiring ICD shocks therpay 5/14  . DM2 (diabetes mellitus, type 2), newly diagnosed 05/05/2013  . CKD (chronic kidney disease) stage 3, GFR 30-59 ml/min 05/05/2013  . Polymyalgia rheumatica     Past Surgical History  Procedure Laterality Date  . Cardiac defibrillator placement  03/2011    MDT ICD implanted at [facility] by Dr Tana Coast  . Coronary angioplasty with stent placement    . Pacemaker insertion    . Left  and right heart catheterization with coronary angiogram N/A 05/03/2013    Procedure: LEFT AND RIGHT HEART CATHETERIZATION WITH CORONARY ANGIOGRAM;  Surgeon: Burnell Blanks, MD;  Location: [facility] CATH LAB;  Service: Cardiovascular;  Laterality: N/A;     Current Outpatient Prescriptions  Medication Sig Dispense Refill  . amiodarone (PACERONE) 200 MG tablet Take 1 tablet (200 mg total) by mouth 2 (two) times daily. X 2 weeks, then once daily 60 tablet 4  . amLODipine (NORVASC) 10 MG tablet Take 10 mg by mouth daily.    Marland Kitchen aspirin EC 81 MG tablet Take 81 mg by mouth daily.    Marland Kitchen atorvastatin (LIPITOR) 80 MG tablet Take 80 mg by mouth at bedtime.    . betamethasone dipropionate (DIPROLENE) 0.05 % ointment Apply 1 application  topically 3 (three) times daily as needed (dermatitis). Applies to forehead and scalp    . clopidogrel (PLAVIX) 75 MG tablet Take 75 mg by mouth at bedtime.     Marland Kitchen ezetimibe (ZETIA) 10 MG tablet Take 10 mg by mouth at bedtime.     . isosorbide mononitrate (IMDUR) 30 MG 24 hr tablet Take 30 mg by mouth daily.    Marland Kitchen lisinopril (PRINIVIL,ZESTRIL) 20 MG tablet Take 20 mg by mouth daily.    . metoprolol succinate (TOPROL-XL) 50 MG 24 hr tablet Take 1 tablet (50 mg total) by mouth 2 (two) times daily. Take with or immediately following a meal. 180 tablet 0  . Multiple Vitamin (MULTIVITAMIN WITH MINERALS) TABS Take 1 tablet by mouth at bedtime.     . Potassium Chloride (KLOR-CON PO) Take 10 mEq by mouth daily.    Marland Kitchen spironolactone (ALDACTONE) 25 MG tablet Take 12.5 mg by mouth daily.     No current facility-administered medications for this visit.    Allergies:   Flagyl; Potassium-containing compounds; Contrast media; Ioxaglate; and Potassium sulfate    Social History:  The patient  reports that she has been smoking.  She does not have any smokeless tobacco history on file. She reports that she does not drink alcohol or use illicit drugs.   Family History:  The patient's family history includes Alcoholism in her father and sister; Breast cancer in her mother; Diabetes in her brother, brother, brother, brother, and father; Heart attack in her brother and sister; Heart disease in her father, mother, and sister; Hypertension in her father, mother, and sister; Stroke in her brother and mother; Thyroid disease in her sister.    ROS:   Please see the history of present illness.   Review of Systems  Constitution: Positive for decreased appetite, diaphoresis, malaise/fatigue and weight loss.  Eyes: Positive for visual disturbance.  Cardiovascular: Positive for chest pain and irregular heartbeat.  Respiratory: Positive for shortness of breath.   Hematologic/Lymphatic: Bruises/bleeds easily.    Musculoskeletal: Positive for back pain.  Gastrointestinal: Positive for abdominal pain, nausea and vomiting. Negative for hematochezia and melena.  Genitourinary: Negative for hematuria.  Neurological: Positive for loss of balance.  All other systems reviewed and are negative.     PHYSICAL EXAM: VS:  BP 140/80 mmHg  Pulse 67  Ht 5\' 3"  (1.6 m)  Wt 185 lb (83.915 kg)  BMI 32.78 kg/m2    Wt Readings from Last 3 Encounters:  05/25/14 185 lb (83.915 kg)  05/21/14 176 lb 12.9 oz (80.2 kg)  09/22/13 198 lb (89.812 kg)     GEN: Well nourished, well developed, in no acute distress HEENT: normal Neck: no JVD, no masses Cardiac:  Normal S1/S2, RRR; no murmur, no rubs or gallops, no edema  Respiratory:  clear to auscultation bilaterally, no wheezing, rhonchi or rales. GI: soft, nontender, nondistended, + BS MS: no deformity or atrophy Skin: warm and dry  Neuro:  CNs II-XII intact, Strength and sensation are intact Psych: Normal affect   EKG:  EKG is ordered today.  It demonstrates:   A paced, HR 67, PVC, inf Q waves, inf-lat TWI   Recent Labs: 05/19/2014: Magnesium 2.5; TSH 12.116* 05/20/2014: ALT 32; BUN 26*; Creatinine 1.79*; Hemoglobin 12.9; Platelets 183; Potassium 3.9; Sodium 137    Lipid Panel    Component Value Date/Time   CHOL 123 05/20/2014 0319   TRIG 50 05/20/2014 0319   HDL 44 05/20/2014 0319   CHOLHDL 2.8 05/20/2014 0319   VLDL 10 05/20/2014 0319   LDLCALC 69 05/20/2014 0319    ASSESSMENT AND PLAN:  1.  Ventricular Tachycardia:  She continues to have sustained VT.  Her device has not been able to break her VT with ATP.  She has been having episodes of exertional chest pain that is reminiscent of her prior angina.  Question if this is ischemia mediated or if her scar is the substrate for her VT.  I reviewed her case with Dr. Darlin Coco (DOD).      -  Admit to [facility] today.    -  Plan cardiac cath tomorrow.    -  Continue Amiodarone 400 mg bid.      -  Will get EP to follow.  Will likely need device adjustments.  If cath does not demonstrate significant CAD, may need to consider VT ablation. 2.  Chest Pain:  She is having exertional chest symptoms that remind her of her previous angina.  As noted above, I question if her VT is ischemia mediated vs secondary to scar as substrate.  Plan cardiac cath tomorrow.  Start Heparin per pharmacy.  Check serial CEs. 3.  CAD:  Proceed with cath tomorrow as noted.  Continue ASA, statin, amlodipine, Plavix, nitrates, ACEI, beta blocker. 4.  Ischemic CM:  Continue ACEI, beta blocker, spironolactone.  Recent EF 30-35% by echo. 5.  HTN:  Continue current regimen.   6.  Hyperlipidemia:  Continue statin. 7.  Chronic Kidney Disease:  Check BMET.  Start gentle hydration for cath tomorrow.    Labs/ tests ordered today include:  No orders of the defined types were placed in this encounter.    Disposition:   Admit to [facility] today.   Signed, Versie Starks, MHS 05/25/2014 1:08 PM    [facility] [address], [place], [place]  [address] Phone: [phone]; Fax: [phone]

## 2014-05-25 NOTE — H&P (Addendum)
History and Physical   Date:  05/25/2014   ID:  [name] Terry, DOB 28-Jan-1951, MRN [id number]  PCP:  Bronson Curb, PA-C  Cardiologist:  Dr. Jenkins Rouge   Electrophysiologist:  Dr. Thompson Grayer   Chief Complaint  Patient presents with  . Chest Pain  . Ventricular Tachy     History of Present Illness: [name] Terry is a 64 y.o. female with a hx of CAD s/p prior PCI/stent, ICM, VTach, s/p AICD (implanted at [facility]), HTN, HL, DM2, CKD, prior CVA.    Recently admitted 2/25-2/27 to [facility].  She was originally seen in [facility] with ICD discharge x 4.  She was put on Amiodarone.  She was readmitted with chest pain x 2.  She requested transfer to [facility] upon her second admission.  She had 30 beat run of VT prior to transfer from [place].  CEs were neg at [facility].  At [facility], her ICD was interrogated.  She did receive appropriate ATP.  Amiodarone was increased x 2 weeks (400 bid >> 400 QD).  Recommendation was to consider ablation if VT not controlled.      She went back to the ED at [facility] yesterday with sudden onset of rapid palpitations and L sided chest pain. She was told that she was back in VT.  She was given IV Amiodarone in the ambulance.  Interrogation of her device today demonstrates 3 episodes of VT treated unsuccessfully with ATP.  HR was in the monitor zone.  Therefore she was not shocked.  She has been having several PVCs and PVC runs.     Though she has chest discomfort during VT, this has resolved with sinus.  She denies further syncope.     Studies/Reports Reviewed Today:  Echocardiogram 05/19/14 - Mild focal basal hypertrophy of the septum. EF 30-35%. Inf and Inf-septal AK - Mod MR - Mild LAE - Mod TR - PA peak pressure: 43 mm Hg (S).  Cardiac Cath 05/03/13 Left main: No obstructive disease.  Left Anterior Descending Artery: Diffuse 30% stenosis in the proximal, mid and distal vessel. D1 ostial 30% stenosis.  Circumflex Artery:  Diffuse 50% proximal, mid and  distal vessel. OM1 30% proximal, OM2 proximal 60%, OM3 30% proximal followed by diffuse 90% distally  Right Coronary Artery:  patent mid stent, Mild plaque in proximal and distal vessel.  Impression: 1. Non-obstructive CAD with no focal targets for PCI, 2. Ischemic cardiomyopathy   Past Medical History  Diagnosis Date  . Heart disease   . History of stroke 10  . Myocardial infarction   . Lymphoma   . CHF (congestive heart failure)   . HTN (hypertension)   . HLD (hyperlipidemia)   . Kidney disease   . Ventricular tachycardia     CL 300 msec requiring ICD shocks therpay 5/14  . DM2 (diabetes mellitus, type 2), newly diagnosed 05/05/2013  . CKD (chronic kidney disease) stage 3, GFR 30-59 ml/min 05/05/2013  . Polymyalgia rheumatica     Past Surgical History  Procedure Laterality Date  . Cardiac defibrillator placement  03/2011    MDT ICD implanted at [facility] by Dr Tana Coast  . Coronary angioplasty with stent placement    . Pacemaker insertion    . Left and right heart catheterization with coronary angiogram N/A 05/03/2013    Procedure: LEFT AND RIGHT HEART CATHETERIZATION WITH CORONARY ANGIOGRAM;  Surgeon: Burnell Blanks, MD;  Location: [facility] CATH LAB;  Service: Cardiovascular;  Laterality: N/A;  Current Outpatient Prescriptions  Medication Sig Dispense Refill  . amiodarone (PACERONE) 200 MG tablet Take 1 tablet (200 mg total) by mouth 2 (two) times daily. X 2 weeks, then once daily 60 tablet 4  . amLODipine (NORVASC) 10 MG tablet Take 10 mg by mouth daily.    Marland Kitchen aspirin EC 81 MG tablet Take 81 mg by mouth daily.    Marland Kitchen atorvastatin (LIPITOR) 80 MG tablet Take 80 mg by mouth at bedtime.    . betamethasone dipropionate (DIPROLENE) 0.05 % ointment Apply 1 application topically 3 (three) times daily as needed (dermatitis). Applies to forehead and scalp    . clopidogrel (PLAVIX) 75 MG tablet Take 75 mg by mouth at bedtime.     Marland Kitchen ezetimibe (ZETIA) 10 MG tablet Take 10 mg by mouth at  bedtime.     . isosorbide mononitrate (IMDUR) 30 MG 24 hr tablet Take 30 mg by mouth daily.    Marland Kitchen lisinopril (PRINIVIL,ZESTRIL) 20 MG tablet Take 20 mg by mouth daily.    . metoprolol succinate (TOPROL-XL) 50 MG 24 hr tablet Take 1 tablet (50 mg total) by mouth 2 (two) times daily. Take with or immediately following a meal. 180 tablet 0  . Multiple Vitamin (MULTIVITAMIN WITH MINERALS) TABS Take 1 tablet by mouth at bedtime.     . Potassium Chloride (KLOR-CON PO) Take 10 mEq by mouth daily.    Marland Kitchen spironolactone (ALDACTONE) 25 MG tablet Take 12.5 mg by mouth daily.     No current facility-administered medications for this visit.    Allergies:   Flagyl; Potassium-containing compounds; Contrast media; Ioxaglate; and Potassium sulfate    Social History:  The patient  reports that she has been smoking.  She does not have any smokeless tobacco history on file. She reports that she does not drink alcohol or use illicit drugs.   Family History:  The patient's family history includes Alcoholism in her father and sister; Breast cancer in her mother; Diabetes in her brother, brother, brother, brother, and father; Heart attack in her brother and sister; Heart disease in her father, mother, and sister; Hypertension in her father, mother, and sister; Stroke in her brother and mother; Thyroid disease in her sister.    ROS:   Please see the history of present illness.   Review of Systems  Constitution: Positive for decreased appetite, diaphoresis, malaise/fatigue and weight loss.  Eyes: Positive for visual disturbance.  Cardiovascular: Positive for chest pain and irregular heartbeat.  Respiratory: Positive for shortness of breath.   Hematologic/Lymphatic: Bruises/bleeds easily.  Musculoskeletal: Positive for back pain.  Gastrointestinal: Positive for abdominal pain, nausea and vomiting. Negative for hematochezia and melena.  Genitourinary: Negative for hematuria.  Neurological: Positive for loss of  balance.  All other systems reviewed and are negative.     PHYSICAL EXAM: VS:  BP 140/80 mmHg  Pulse 67  Ht 5\' 3"  (1.6 m)  Wt 185 lb (83.915 kg)  BMI 32.78 kg/m2    Wt Readings from Last 3 Encounters:  05/25/14 185 lb (83.915 kg)  05/21/14 176 lb 12.9 oz (80.2 kg)  09/22/13 198 lb (89.812 kg)     GEN: Well nourished, well developed, in no acute distress HEENT: normal Neck: no JVD, no masses Cardiac:  Normal S1/S2, RRR; no murmur, no rubs or gallops, no edema  Respiratory:  clear to auscultation bilaterally, no wheezing, rhonchi or rales. GI: soft, nontender, nondistended, + BS MS: no deformity or atrophy Skin: warm and dry  Neuro:  CNs  II-XII intact, Strength and sensation are intact Psych: Normal affect   EKG:  EKG is ordered today.  It demonstrates:   A paced, HR 67, PVC, inf Q waves, inf-lat TWI   Recent Labs: 05/19/2014: Magnesium 2.5; TSH 12.116* 05/20/2014: ALT 32; BUN 26*; Creatinine 1.79*; Hemoglobin 12.9; Platelets 183; Potassium 3.9; Sodium 137    Lipid Panel    Component Value Date/Time   CHOL 123 05/20/2014 0319   TRIG 50 05/20/2014 0319   HDL 44 05/20/2014 0319   CHOLHDL 2.8 05/20/2014 0319   VLDL 10 05/20/2014 0319   LDLCALC 69 05/20/2014 0319    ASSESSMENT AND PLAN:  1.  Ventricular Tachycardia:  She continues to have recurrent sustained VT.   She is admitted for EP management. She has failed medical therapy with amiodarone EP MD to see and decide treatment (Below)       2.  CAD:  Symptoms improved with sinus.  Cath last year reviewed and medical management advised at that time 3. CRI:  Creatinine is chronically elevated 4.  Ischemic CM:  Continue ACEI,  spironolactone.  Recent EF 30-35% by echo. 5.  HTN:  Continue current regimen.   6.  Hyperlipidemia:  Continue statin. 7.  Elevated TSH: check T4   Addendum:  I have seen, examined the patient, and reviewed the above assessment and plan with [name] Terry.  Changes to above are made where  necessary.  ICD interrogation from the office is reviewed and reveals VT with CL of 390 msec for which ATP was unsuccessful.  The tachycardia at times is slower than programmed detection rate.  The patient has scar related VT (likely arising from her inferior scar).  I feel that this is unlikely to be due to active ischemia.  She had cath just a year ago and medical therapy was advised.  Given renal failure, we should avoid repeat cath and treat medically if able.  Initial CMs are negative.  I will therefore discontinue heparin drip at this time.  Our plan upon recent discharge was amiodarone with return for VT ablation if further VT occurred. Therapeutic strategies for ventricular tachycardia including medicine and ablation were discussed in detail with the patient today. Risk, benefits, and alternatives to EP study and radiofrequency ablation were also discussed in detail today. These risks include but are not limited to stroke, bleeding, vascular damage, tamponade, perforation, damage to the heart and other structures, AV block requiring pacemaker, worsening renal function, ICD lead dislodgement and death. The patient understands these risk and wishes to proceed.  We will therefore proceed with catheter ablation in the am.  Hold amiodarone and beta blocker to allow for arrhythmia induction overnight.  Hold anticoagulation in anticipation of ablation.     Co Sign: Thompson Grayer, MD 05/25/2014 8:20 PM

## 2014-05-25 NOTE — Telephone Encounter (Signed)
New message     Pt c/o of Chest Pain: STAT if CP now or developed within 24 hours  1. Are you having CP right now? A little bit  2. Are you experiencing any other symptoms (ex. SOB, nausea, vomiting, sweating)? Pt was in ER in [place] last night with v-tach 3. How long have you been experiencing CP?   4. Is your CP continuous or coming and going? continuous  5. Have you taken Nitroglycerin? no ?pt has an ICD

## 2014-05-25 NOTE — Progress Notes (Signed)
ICD check in clinic with [name] Terry. 3 treated VT episodes and monitored VT episodes on 05-24-14. ATP unsuccessful  on all 3 episodes. ? changes to device. Per [name] Terry pt being admitted to hospital.

## 2014-05-26 ENCOUNTER — Encounter (HOSPITAL_COMMUNITY): Payer: Self-pay | Admitting: Internal Medicine

## 2014-05-26 ENCOUNTER — Encounter (HOSPITAL_COMMUNITY)
Admission: AD | Disposition: A | Discharge status: Home / Self Care | Payer: Self-pay | Source: Ambulatory Visit | Attending: Cardiology

## 2014-05-26 ENCOUNTER — Ambulatory Visit (HOSPITAL_COMMUNITY): Admission: AD | Admit: 2014-05-26 | Payer: Medicare Other | Source: Ambulatory Visit | Admitting: Cardiology

## 2014-05-26 DIAGNOSIS — I10 Essential (primary) hypertension: Secondary | ICD-10-CM

## 2014-05-26 DIAGNOSIS — E785 Hyperlipidemia, unspecified: Secondary | ICD-10-CM

## 2014-05-26 DIAGNOSIS — I472 Ventricular tachycardia: Secondary | ICD-10-CM

## 2014-05-26 HISTORY — PX: V-TACH ABLATION: SHX5498

## 2014-05-26 LAB — BASIC METABOLIC PANEL
Anion gap: 10 (ref 5–15)
BUN: 28 mg/dL — ABNORMAL HIGH (ref 6–23)
CALCIUM: 9.5 mg/dL (ref 8.4–10.5)
CO2: 22 mmol/L (ref 19–32)
Chloride: 106 mmol/L (ref 96–112)
Creatinine, Ser: 1.96 mg/dL — ABNORMAL HIGH (ref 0.50–1.10)
GFR calc Af Amer: 30 mL/min — ABNORMAL LOW (ref >90)
GFR calc non Af Amer: 26 mL/min — ABNORMAL LOW (ref >90)
Glucose, Bld: 80 mg/dL (ref 70–99)
Potassium: 5 mmol/L (ref 3.5–5.1)
Sodium: 138 mmol/L (ref 135–145)

## 2014-05-26 LAB — GLUCOSE, CAPILLARY
GLUCOSE-CAPILLARY: 210 mg/dL — AB (ref 70–99)
Glucose-Capillary: 84 mg/dL (ref 70–99)
Glucose-Capillary: 150 mg/dL — ABNORMAL HIGH (ref 70–99)

## 2014-05-26 LAB — CBC
HCT: 37.1 % (ref 36.0–46.0)
HEMOGLOBIN: 12.5 g/dL (ref 12.0–15.0)
MCH: 29.7 pg (ref 26.0–34.0)
MCHC: 33.7 g/dL (ref 30.0–36.0)
MCV: 88.1 fL (ref 78.0–100.0)
Platelets: 183 10*3/uL (ref 150–400)
RBC: 4.21 MIL/uL (ref 3.87–5.11)
RDW: 16 % — ABNORMAL HIGH (ref 11.5–15.5)
WBC: 7.6 10*3/uL (ref 4.0–10.5)

## 2014-05-26 LAB — POCT ACTIVATED CLOTTING TIME
ACTIVATED CLOTTING TIME: 208 s
Activated Clotting Time: 233 seconds
Activated Clotting Time: 263 seconds
Activated Clotting Time: 276 seconds
Activated Clotting Time: 134 seconds

## 2014-05-26 SURGERY — V-TACH ABLATION
Anesthesia: LOCAL

## 2014-05-26 MED ORDER — HEPARIN SODIUM (PORCINE) 1000 UNIT/ML IJ SOLN
INTRAMUSCULAR | Status: AC
  Filled 2014-05-26: qty 1

## 2014-05-26 MED ORDER — BUPIVACAINE HCL (PF) 0.25 % IJ SOLN
INTRAMUSCULAR | Status: AC
  Filled 2014-05-26: qty 30

## 2014-05-26 MED ORDER — FENTANYL CITRATE 0.05 MG/ML IJ SOLN
INTRAMUSCULAR | Status: AC
  Filled 2014-05-26: qty 2

## 2014-05-26 MED ORDER — MIDAZOLAM HCL 5 MG/5ML IJ SOLN
INTRAMUSCULAR | Status: AC
  Filled 2014-05-26: qty 5

## 2014-05-26 MED ORDER — SODIUM CHLORIDE 0.9 % IJ SOLN: 3.0000 mL | INTRAMUSCULAR | Status: DC | PRN

## 2014-05-26 MED ORDER — METOPROLOL SUCCINATE ER 50 MG PO TB24
50.0000 mg | ORAL_TABLET | Freq: Two times a day (BID) | ORAL | Status: DC
  Administered 2014-05-26 – 2014-05-27 (×3): 50 mg via ORAL
  Filled 2014-05-26 (×4): qty 1

## 2014-05-26 MED ORDER — HYDROCODONE-ACETAMINOPHEN 5-325 MG PO TABS: 1.0000 | ORAL_TABLET | ORAL | Status: DC | PRN

## 2014-05-26 MED ORDER — SODIUM CHLORIDE 0.9 % IJ SOLN
3.0000 mL | Freq: Two times a day (BID) | INTRAMUSCULAR | Status: DC
  Administered 2014-05-26 – 2014-05-27 (×2): 3 mL via INTRAVENOUS

## 2014-05-26 MED ORDER — HEPARIN (PORCINE) IN NACL 2-0.9 UNIT/ML-% IJ SOLN
INTRAMUSCULAR | Status: AC
  Filled 2014-05-26: qty 500

## 2014-05-26 MED ORDER — PROTAMINE SULFATE 10 MG/ML IV SOLN
INTRAVENOUS | Status: AC
  Filled 2014-05-26: qty 5

## 2014-05-26 MED ORDER — ONDANSETRON HCL 4 MG/2ML IJ SOLN: 4.0000 mg | Freq: Four times a day (QID) | INTRAMUSCULAR | Status: DC | PRN

## 2014-05-26 MED ORDER — ACETAMINOPHEN 325 MG PO TABS: 650.0000 mg | ORAL_TABLET | ORAL | Status: DC | PRN

## 2014-05-26 MED ORDER — SODIUM CHLORIDE 0.9 % IV SOLN: 250.0000 mL | INTRAVENOUS | Status: DC | PRN

## 2014-05-26 MED ORDER — AMIODARONE HCL 200 MG PO TABS
400.0000 mg | ORAL_TABLET | Freq: Two times a day (BID) | ORAL | Status: DC
  Administered 2014-05-26 – 2014-05-27 (×3): 400 mg via ORAL
  Filled 2014-05-26 (×4): qty 2

## 2014-05-26 MED ORDER — DOBUTAMINE IN D5W 4-5 MG/ML-% IV SOLN
INTRAVENOUS | Status: AC
  Filled 2014-05-26: qty 250

## 2014-05-26 NOTE — Progress Notes (Signed)
Site area: right groin a 8 french arterial sheath and 6, 7 , 8 french venous sheath was removed  Site Prior to Removal:  Level 0  Pressure Applied For 20 MINUTES    Minutes Beginning at 1205p  Manual:   Yes.    Patient Status During Pull:  stable  Post Pull Groin Site:  Level 0  Post Pull Instructions Given:  Yes.    Post Pull Pulses Present:  Yes.    Dressing Applied:  Yes.    Comments:  VS remain stable during sheath pull.  Pt denies any discomfort at site,.

## 2014-05-26 NOTE — Progress Notes (Signed)
Pt received to room 2W27 from EP lab.  Right groin with clean, dry dressing. Site soft. Bilateral pedal pulses present 2+. Pt instructed on bed rest for 6 hours, until 1825, and use of bedpan as needed. Also instructed to call for pain or swelling in groin. She verbalizes understanding.

## 2014-05-26 NOTE — Interval H&P Note (Signed)
History and Physical Interval Note:  05/26/2014 7:04 AM  [name] Terry  has presented today for surgery, with the diagnosis of VT  The various methods of treatment have been discussed with the patient and family. After consideration of risks, benefits and other options for treatment, the patient has consented to  Procedure(s): V-TACH ABLATION (N/A) as a surgical intervention .  The patient's history has been reviewed, patient examined, no change in status, stable for surgery.  I have reviewed the patient's chart and labs.  Questions were answered to the patient's satisfaction.     Thompson Grayer

## 2014-05-26 NOTE — Progress Notes (Signed)
SUBJECTIVE: The patient is doing well today.  No VT overnight.  Rested well and having no chest pain.  At this time, she denies shortness of breath, or any new concerns.  Marland Kitchen amLODipine  10 mg Oral Daily  . aspirin EC  81 mg Oral Daily  . atorvastatin  80 mg Oral QHS  . clopidogrel  75 mg Oral QHS  . ezetimibe  10 mg Oral QHS  . isosorbide mononitrate  60 mg Oral Daily  . lisinopril  20 mg Oral Daily  . multivitamin with minerals  1 tablet Oral QHS  . potassium chloride  10 mEq Oral Daily  . spironolactone  12.5 mg Oral Daily   . sodium chloride 30 mL/hr at 05/25/14 1643  . sodium chloride      OBJECTIVE: Physical Exam: Filed Vitals:   05/25/14 1447 05/25/14 2037 05/26/14 0548 05/26/14 0559  BP: 109/72 112/68  109/66  Pulse: 61 95  65  Temp: 98 F (36.7 C) 98.4 F (36.9 C)  98.3 F (36.8 C)  TempSrc: Oral Oral  Oral  Resp: 14 16  16   Height:  5\' 3"  (1.6 m)    Weight:  184 lb 15.5 oz (83.9 kg) 179 lb 10.8 oz (81.5 kg)   SpO2: 100% 100%  100%    Intake/Output Summary (Last 24 hours) at 05/26/14 1007 Last data filed at 05/25/14 1852  Gross per 24 hour  Intake    240 ml  Output      0 ml  Net    240 ml    Telemetry reveals demand atrial pacing, pvcs, no further VT  GEN- The patient is well appearing, alert and oriented x 3 today.   Head- normocephalic, atraumatic Eyes-  Sclera clear, conjunctiva pink Ears- hearing intact Oropharynx- clear Neck- supple,   Lungs- Clear to ausculation bilaterally, normal work of breathing Heart- Regular rate and rhythm  GI- soft, NT, ND, + BS Extremities- no clubbing, cyanosis, or edema Skin- ICD pocket is well healed Psych- euthymic mood, full affect Neuro- strength and sensation are intact  LABS: Basic Metabolic Panel:  Recent Labs  05/25/14 1630 05/26/14 0305  NA 136 138  K 4.6 5.0  CL 105 106  CO2 21 22  GLUCOSE 86 80  BUN 32* 28*  CREATININE 2.16* 1.96*  CALCIUM 9.2 9.5   Liver Function Tests:  Recent  Labs  05/25/14 1630  AST 43*  ALT 49*  ALKPHOS 51  BILITOT 0.6  PROT 6.8  ALBUMIN 3.8   No results for input(s): LIPASE, AMYLASE in the last 72 hours. CBC:  Recent Labs  05/25/14 1630 05/26/14 0305  WBC 7.6 7.6  NEUTROABS 4.4  --   HGB 12.3 12.5  HCT 36.4 37.1  MCV 88.6 88.1  PLT 181 183   Cardiac Enzymes:  Recent Labs  05/25/14 1630  TROPONINI <0.03    ASSESSMENT AND PLAN:   1. Ventricular Tachycardia:  Recurrent scar related VT Therapeutic strategies for ventricular tachycardia including medicine and ablation were discussed with the patient again today. Risk, benefits, and alternatives to EP study and radiofrequency ablation were also discussed in detail today. These risks include but are not limited to stroke, bleeding, vascular damage, tamponade, perforation, damage to the heart and other structures, ICD lead dislodgement requiring revision,  AV block requiring pacemaker, worsening renal function, and death. The patient understands these risk and wishes to proceed.     2. CAD: Symptoms improved with sinus. Cath last year reviewed and  medical management advised at that time.  Restart beta blocker post ablation  3. CRI: Creatinine is chronically elevated, slightly better today  4. Ischemic CM: Continue ACEI, spironolactone but follow creatinine closely. Recent EF 30-35% by echo.  5. HTN: Continue current regimen.   6. Hyperlipidemia: Continue statin.  7. Elevated TSH: T4 normal 05/19/14   Thompson Grayer, MD 05/26/2014 7:05 AM

## 2014-05-26 NOTE — Discharge Instructions (Signed)
No driving for 6 months. No lifting over 5 lbs for 1 week. No sexual activity for 1 week.  Keep procedure site clean & dry. If you notice increased pain, swelling, bleeding or pus, call/return!  You may shower, but no soaking baths/hot tubs/pools for 1 week.  ° ° °

## 2014-05-26 NOTE — Progress Notes (Signed)
Utilization review completed. Ranae Casebier, RN, BSN. 

## 2014-05-26 NOTE — Brief Op Note (Signed)
05/25/2014 - 05/26/2014  11:05 AM  PATIENT:  [name] Terry  64 y.o. female  PRE-OPERATIVE DIAGNOSIS:  VT  POST-OPERATIVE DIAGNOSIS:  VT  PROCEDURE:  Procedure(s): V-TACH ABLATION (N/A)  SURGEON:  Surgeon(s) and Role:    * Thompson Grayer, MD - Primary  ANESTHESIA:   IV sedation  EBL:     BLOOD ADMINISTERED:none  DRAINS: none   LOCAL MEDICATIONS USED:  LIDOCAINE   SPECIMEN:  No Specimen  DISPOSITION OF SPECIMEN:  N/A  COUNTS:  YES  TOURNIQUET:  * No tourniquets in log *  DICTATION: .Other Dictation: Dictation Number see dictation  PLAN OF CARE: Admit to inpatient   PATIENT DISPOSITION:  PACU - hemodynamically stable.   Delay start of Pharmacological VTE agent (>24hrs) due to surgical blood loss or risk of bleeding: yes

## 2014-05-27 ENCOUNTER — Inpatient Hospital Stay (HOSPITAL_COMMUNITY): Payer: Medicare Other

## 2014-05-27 LAB — BASIC METABOLIC PANEL
ANION GAP: 6 (ref 5–15)
BUN: 23 mg/dL (ref 6–23)
CHLORIDE: 106 mmol/L (ref 96–112)
CO2: 23 mmol/L (ref 19–32)
CREATININE: 1.93 mg/dL — AB (ref 0.50–1.10)
Calcium: 8.8 mg/dL (ref 8.4–10.5)
GFR calc Af Amer: 31 mL/min — ABNORMAL LOW (ref >90)
GFR calc non Af Amer: 27 mL/min — ABNORMAL LOW (ref >90)
Glucose, Bld: 93 mg/dL (ref 70–99)
POTASSIUM: 4.5 mmol/L (ref 3.5–5.1)
SODIUM: 135 mmol/L (ref 135–145)

## 2014-05-27 LAB — GLUCOSE, CAPILLARY
Glucose-Capillary: 142 mg/dL — ABNORMAL HIGH (ref 70–99)
Glucose-Capillary: 83 mg/dL (ref 70–99)

## 2014-05-27 LAB — CBC
HEMATOCRIT: 35.5 % — AB (ref 36.0–46.0)
Hemoglobin: 12.2 g/dL (ref 12.0–15.0)
MCH: 30.2 pg (ref 26.0–34.0)
MCHC: 34.4 g/dL (ref 30.0–36.0)
MCV: 87.9 fL (ref 78.0–100.0)
PLATELETS: 144 10*3/uL — AB (ref 150–400)
RBC: 4.04 MIL/uL (ref 3.87–5.11)
RDW: 16 % — AB (ref 11.5–15.5)
WBC: 9.2 10*3/uL (ref 4.0–10.5)

## 2014-05-27 MED ORDER — AMIODARONE HCL 200 MG PO TABS: ORAL_TABLET | ORAL | Status: DC

## 2014-05-27 MED ORDER — METOPROLOL SUCCINATE ER 50 MG PO TB24: 50.0000 mg | ORAL_TABLET | Freq: Two times a day (BID) | ORAL | Status: DC

## 2014-05-27 NOTE — Op Note (Signed)
[name], [name]                 ACCOUNT NO.:  [id number]  MEDICAL RECORD NO.:  [id number]  LOCATION:                                 FACILITY:  PHYSICIAN:  Thompson Grayer, MD       DATE OF BIRTH:  [birth year]  DATE OF PROCEDURE:  05/26/2014 DATE OF DISCHARGE:                              OPERATIVE REPORT   SURGEON:  Thompson Grayer, MD.  PREPROCEDURE DIAGNOSIS:  Ventricular tachycardia.  POSTPROCEDURE DIAGNOSIS:  Ischemic ventricular tachycardia.  PROCEDURE: 1. Comprehensive EP study. 2. Coronary sinus pacing and recording. 3. Left ventricular pacing and recording. 4. A 3D mapping of ventricular tachycardia. 5. Radiofrequency ablation of ventricular tachycardia. 6. Arterial blood pressure monitoring. 7. Dual-chamber defibrillator, interrogation, and reprogramming.  INTRODUCTION:  Ms. Bleecker is a very pleasant 64 year old female  with a history of recurrent symptomatic ventricular tachycardia.  She has a right bundle-branch superior  axis VT with a cycle length of 400 milliseconds.  This has been recurrent, requiring ICD therapy  despite medical therapy with metoprolol and  amiodarone.  She therefore presents today for EP study and radiofrequency ablation.  DESCRIPTION OF PROCEDURE:  Informed written consent was obtained, and the patient was brought to the electrophysiology lab in the fasting state.  Her ICD was interrogated and therapies were programmed off for the procedure today.  The patient was adequately sedated with intravenous medications as outlined in the nursing report.  The patient's right groin was prepped and draped in the usual sterile fashion by the EP lab  staff.  Using a percutaneous Seldinger technique, one 6, one 7, and one 8-French hemostasis sheaths were placed into the right common femoral vein.  An 8-French hemostasis sheath was placed in the right common femoral artery for blood pressure monitoring.  A 7- [employer] Decapolar catheter was  introduced through the right common femoral vein and advanced into the coronary sinus  for recording and pacing from this location.  A 6-French quadripolar Josephson catheter was introduced through the right common femoral vein and advanced into the His bundle location.  This catheter was then advanced into the right ventricle for recording and pacing.  The patient presented to the electrophysiology lab in normal sinus rhythm.  Her  PR interval measured 151 milliseconds with a QRS duration of 134 milliseconds and a QT interval of 464 milliseconds.  Her average RR interval measured 987 milliseconds.  Her AH interval measured 116 milliseconds with an HV interval of 43  milliseconds.  She was observed to have incessant PVCs observed.  These were of a right bundle branch inferior axis morphology and felt to be different  from the clinical arrhythmia.  I likely performed 3D mapping of this PVC focus.  The QRS measured 192  milliseconds for the PVC.  The PVC was mapped from  the right ventricle from the His catheter and noted to have a late electrogram.  I therefore elected to perform mapping from the left ventricle.  A 7-French [employer] is marked and touch ablation catheter was introduced through the right common femoral artery and advanced into the left ventricle using a retrograde aortic approach. Heparin was administered intravenously  in order to maintain an adequate ACT throughout the procedure.  A 3D mapping of the PVC focus revealed that it arose from the posterolateral portion of the left ventricle. The  local activation proceeded the surface QRS by 35 milliseconds. Fractionated ventricular activation was observed in this location.  Pace- map matching was performed of this PVC focus and revealed a 96% pace map match in this location.  Extensive voltage mapping was performed within the left ventricle.  This revealed that the patient had a large posterior scar, which also involved  the basal 2/3rd of the inferior wall as well as a portion of the lateral  wall of the left ventricle. Arrhythmias induction was then performed with pacing from the ventricle with a basic cycle length of 500 milliseconds with tachycardia easily and incessantly induced with 500/280 milliseconds.  Ventricular tachycardia was induced with 500/280/280 milliseconds.  The cycle length was 330 milliseconds.  The patient was hemodynamically relatively unstable during VT.  Activation mapping was initially performed, which revealed that the VT appeared to arise from the inferior border of the scar.  The patient became unstable and therefore VT was pace terminated. The patient was allowed to recover.  Pace mapping was then performed from the left ventricle.  This revealed that the best pace map was from the inferoseptal portion of scar and 86% pace map match was observed. Ventricular tachycardia with an again induced, however, again extensive mapping could not be performed for a prolonged period due to hemodynamic instability.  The patient again returned to sinus rhythm with pacing from the right ventricle at 300 milliseconds.  I therefore elected to perform substrate modification.  A series of radiofrequency applications were delivered along the inferior border of the left ventricular scar with a target temperature of 40 degrees at 35 watts.  Extensive ablation was continued along the inferior border and up the lateral border towards the base as well as along the septal border of the scar. Following extensive ablation along the substrate line,  arrhythmia induction was again performed.  Pacing at 500/300/280, no longer induce tachycardia.  The patient had finally sustained tachycardia 500/320/280/240 milliseconds.  This was the same clinical tachycardia, and the patient was again hemodynamically unstable and therefore required pace termination.  I performed additional substrate modification along  the inferoseptal portion of the scar.  The previously described PVC focus was also ablated along the inferolateral border of the scar at the site of earliest activation.  Following ablation, the PVC was no longer present.  Following ablation, the PR interval measured 153  milliseconds with a QRS duration of 127 milliseconds and a QT interval of 424 milliseconds and average RR interval measured 999 milliseconds.  The AH interval measured 150 milliseconds with an HV interval of 48 milliseconds. Ventricular pacing revealed VA dissociation.  Atrial pacing was performed, which revealed an AV Wenckebach cycle length of 430 milliseconds with no evidence of PR greater than RR.  All catheters were removed carefully under fluoroscopic visualization in order to make sure that the defibrillator leads were not been manipulated.  Following ablation, the patient's defibrillator was interrogated and atrial and ventricular lead sensing, impedance, and thresholds were confirmed to be unchanged when compared to the preprocedure values.  Tachycardia therapies were returned to their previous settings.  The procedure was therefore considered completed.  There were no early apparent complications.  CONCLUSIONS: 1. Sinus rhythm upon presentation with PVCs arising from the     inferolateral base of the left ventricle successfully  ablated in     this location. 2. Inducible ventricular tachycardia with a cycle length of 330     milliseconds, which was felt to be the clinical tachycardia.  This     was a right bundle-branch superior axis VT with a Q-wave in V5 and     V6.  This tachycardia was mapped along the  inferoseptal portion of     the left ventricle. 3. A large posterior and inferior LV scar was  demonstrated with     voltage mapping. 4. Extensive substrate modification with radiofrequency ablation     performed along the inferior, inferoseptal, and inferolateral     portions of the LV scar. 5. No  early apparent complications.     Thompson Grayer, MD     JA/MEDQ  D:  05/26/2014  T:  05/27/2014  Job:  [id number]

## 2014-05-27 NOTE — Care Management Note (Signed)
    Page 1 of 1   05/27/2014     3:04:53 PM CARE MANAGEMENT NOTE 05/27/2014  Patient:  [name] Terry, [name] Terry   Account Number:  [id number]  Date Initiated:  05/27/2014  Documentation initiated by:  Marvetta Gibbons  Subjective/Objective Assessment:   Pt admitted for recurrent SVT     Action/Plan:   PTA  pt lived at home   Anticipated DC Date:  05/27/2014   Anticipated DC Plan:  HOME/SELF CARE         Choice offered to / List presented to:             Status of service:  Completed, signed off Medicare Important Message given?  NA - LOS <3 / Initial given by admissions (If response is "NO", the following Medicare IM given date fields will be blank) Date Medicare IM given:   Medicare IM given by:   Date Additional Medicare IM given:   Additional Medicare IM given by:    Discharge Disposition:  HOME/SELF CARE  Per UR Regulation:  Reviewed for med. necessity/level of care/duration of stay  If discussed at [place] of Stay Meetings, dates discussed:    Comments:

## 2014-05-27 NOTE — Discharge Summary (Signed)
ELECTROPHYSIOLOGY PROCEDURE DISCHARGE SUMMARY    Patient ID: [name] Terry,  MRN: [id number], DOB/AGE: [birth year] 64 y.o.  Admit date: 05/25/2014 Discharge date: 05/27/2014  Primary Care Physician: Mackey Birchwood Primary Cardiologist: Johnsie Cancel Electrophysiologist: Jamiel Goncalves  Primary Discharge Diagnosis:  Ventricular tachycardia status post ablation this admission  Secondary Discharge Diagnosis:  1.  CAD s/p DES to RCA 2.  Ischemic cardiomyopathy status post ICD 3.  Hypertension 4.  Hyperlipidemia 5.  Diabetes 6.  CKD, stage 3 7.  Prior CVA   Allergies  Allergen Reactions  . Flagyl [Metronidazole] Swelling and Rash    Face swells   . Contrast Media [Iodinated Diagnostic Agents] Rash  . Ioxaglate Rash  . Potassium Sulfate Rash and Other (See Comments)    Headaches  . Potassium-Containing Compounds Other (See Comments)    Headaches-- reports no problems now     Procedures This Admission:  1.  Electrophysiology study and radiofrequency catheter ablation on 05/26/14 by Dr Rayann Heman.  This study demonstrated PVC's arising from the inferolateral base of the LV successfully ablated. Additional ablation was performed along the inferior, inferoseptal, and inferolateral portions of LV scar.  There were no early apparent complications.   Brief HPI/Hospital Course:  [name] Terry is a 64 y.o. female with a past medical history as outlined above.  She was admitted 05/19/14 with VT and was placed on Amiodarone with plans for ablation if she had recurrent VT.  The day before current admission, she presented to the ER in [place] with recurrent VT and was given IV amiodarone and discharged.  She then presented to our office for further evaluation and was admitted for consideration of catheterization.  Dr Rayann Heman evaluated the patient and felt with negative cardiac enzymes and known symptomatic VT that ablation was warranted.  Risks, benefits, and alternatives were discussed with the  patient who wished to proceed.  She underwent EPS/ablation of PVC focus with scar modification on 05/26/14.  Post ablation, PVC burden was significantly decreased. She was monitored on telemetry overnight which demonstrated atrial pacing with intrinsic ventricular conduction.  Her groin was without complication.  She did have a non-productive cough and CXR was obtained which demonstrated no active disease.  She was considered stable for discharge by Dr Rayann Heman. She will follow-up in the EP clinic in 2 weeks.   Physical Exam: Filed Vitals:   05/26/14 1700 05/26/14 1825 05/26/14 2012 05/27/14 0649  BP: 123/83 120/59 136/68 119/65  Pulse:   59 65  Temp:   99.1 F (37.3 C) 98.9 F (37.2 C)  TempSrc:   Oral Oral  Resp:   19 18  Height:      Weight:    190 lb 0.6 oz (86.2 kg)  SpO2:   100% 9%    Labs:   Lab Results  Component Value Date   WBC 9.2 05/27/2014   HGB 12.2 05/27/2014   HCT 35.5* 05/27/2014   MCV 87.9 05/27/2014   PLT 144* 05/27/2014    Recent Labs Lab 05/25/14 1630  05/27/14 0354  NA 136  < > 135  K 4.6  < > 4.5  CL 105  < > 106  CO2 21  < > 23  BUN 32*  < > 23  CREATININE 2.16*  < > 1.93*  CALCIUM 9.2  < > 8.8  PROT 6.8  --   --   BILITOT 0.6  --   --   ALKPHOS 51  --   --  ALT 49*  --   --   AST 43*  --   --   GLUCOSE 86  < > 93  < > = values in this interval not displayed.   Discharge Medications:    Medication List    TAKE these medications        amiodarone 200 MG tablet  Commonly known as:  PACERONE  Take 400mg  twice daily for 7 days, then 200mg  twice daily for 2 weeks, then 200mg  daily     amLODipine 10 MG tablet  Commonly known as:  NORVASC  Take 10 mg by mouth daily.     aspirin EC 81 MG tablet  Take 81 mg by mouth daily.     atorvastatin 80 MG tablet  Commonly known as:  LIPITOR  Take 80 mg by mouth at bedtime.     betamethasone dipropionate 0.05 % ointment  Commonly known as:  DIPROLENE  Apply 1 application topically 3 (three) times  daily as needed (dermatitis). Applies to forehead and scalp     clopidogrel 75 MG tablet  Commonly known as:  PLAVIX  Take 75 mg by mouth at bedtime.     ezetimibe 10 MG tablet  Commonly known as:  ZETIA  Take 10 mg by mouth at bedtime.     isosorbide mononitrate 30 MG 24 hr tablet  Commonly known as:  IMDUR  Take 30 mg by mouth daily.     lisinopril 20 MG tablet  Commonly known as:  PRINIVIL,ZESTRIL  Take 20 mg by mouth daily.     metoprolol succinate 50 MG 24 hr tablet  Commonly known as:  TOPROL-XL  Take 1 tablet (50 mg total) by mouth 2 (two) times daily. Take with or immediately following a meal.     multivitamin with minerals Tabs tablet  Take 1 tablet by mouth at bedtime.     potassium chloride 10 MEQ tablet  Commonly known as:  K-DUR,KLOR-CON  Take 10 mEq by mouth daily.     spironolactone 25 MG tablet  Commonly known as:  ALDACTONE  Take 12.5 mg by mouth daily.        Disposition:  Discharge Instructions    Call MD for:  redness, tenderness, or signs of infection (pain, swelling, redness, odor or green/yellow discharge around incision site)    Complete by:  As directed      Diet - low sodium heart healthy    Complete by:  As directed      Increase activity slowly    Complete by:  As directed           Follow-up Information    Follow up with Patsey Berthold, NP On 06/06/2014.   Specialty:  Nurse Practitioner   Why:  at 8:30AM   Contact information:   [place] [address] [phone]       Duration of Discharge Encounter: Greater than 30 minutes including physician time.  Signed, Chanetta Marshall, NP 05/27/2014 6:58 PM   Thompson Grayer MD

## 2014-05-27 NOTE — Progress Notes (Signed)
SUBJECTIVE: The patient is doing well today.  At this time, she denies shortness of breath, or any new concerns.  PVC burden decreased on telemetry.  S/p VT ablation yesterday by Dr Rayann Heman.   CURRENT MEDICATIONS: . amiodarone  400 mg Oral BID  . amLODipine  10 mg Oral Daily  . aspirin EC  81 mg Oral Daily  . atorvastatin  80 mg Oral QHS  . clopidogrel  75 mg Oral QHS  . ezetimibe  10 mg Oral QHS  . isosorbide mononitrate  60 mg Oral Daily  . lisinopril  20 mg Oral Daily  . metoprolol succinate  50 mg Oral BID  . multivitamin with minerals  1 tablet Oral QHS  . potassium chloride  10 mEq Oral Daily  . sodium chloride  3 mL Intravenous Q12H  . spironolactone  12.5 mg Oral Daily      OBJECTIVE: Physical Exam: Filed Vitals:   05/26/14 1600 05/26/14 1700 05/26/14 1825 05/26/14 2012  BP: 137/67 123/83 120/59 136/68  Pulse:    59  Temp:    99.1 F (37.3 C)  TempSrc:    Oral  Resp:    19  Height:      Weight:      SpO2:    100%    Intake/Output Summary (Last 24 hours) at 05/27/14 1610 Last data filed at 05/26/14 1500  Gross per 24 hour  Intake    480 ml  Output      0 ml  Net    480 ml    Telemetry reveals atrial pacing with intrinsic ventricular conduction, PVC burden decreased  GEN- The patient is well appearing, alert and oriented x 3 today.   Head- normocephalic, atraumatic Eyes-  Sclera clear, conjunctiva pink Ears- hearing intact Oropharynx- clear Neck- supple,   Lungs- Clear to ausculation bilaterally, normal work of breathing Heart- Regular rate and rhythm  GI- soft, NT, ND, + BS Extremities- no clubbing, cyanosis, or edema, groin without hematoma/bruit Skin- ICD pocket is well healed Psych- euthymic mood, full affect Neuro- strength and sensation are intact  LABS: Basic Metabolic Panel:  Recent Labs  05/26/14 0305 05/27/14 0354  NA 138 135  K 5.0 4.5  CL 106 106  CO2 22 23  GLUCOSE 80 93  BUN 28* 23  CREATININE 1.96* 1.93*  CALCIUM 9.5  8.8   Liver Function Tests:  Recent Labs  05/25/14 1630  AST 43*  ALT 49*  ALKPHOS 51  BILITOT 0.6  PROT 6.8  ALBUMIN 3.8   CBC:  Recent Labs  05/25/14 1630 05/26/14 0305 05/27/14 0354  WBC 7.6 7.6 9.2  NEUTROABS 4.4  --   --   HGB 12.3 12.5 12.2  HCT 36.4 37.1 35.5*  MCV 88.6 88.1 87.9  PLT 181 183 144*   Cardiac Enzymes:  Recent Labs  05/25/14 1630  TROPONINI <0.03    ASSESSMENT AND PLAN:   1. Ventricular Tachycardia:  Recurrent scar related VT S/p VT ablation 05-26-14, groin without complication, PVC burden decreased on telemetry Continue amiodarone 400mg  bid X7 days, then amiodarone 200mg  bid X2 weeks then 200mg  daily No driving X6 months (pt aware)   2. CAD: Symptoms improved with sinus. Cath last year reviewed and medical management advised at that time.  Continue BB, ASA, Plavix, Imdur  3. CRI: Creatinine is chronically elevated, stable today  4. Ischemic CM: Continue ACEI, spironolactone but follow creatinine closely. Recent EF 30-35% by echo.  5. HTN: Continue current regimen.  6. Hyperlipidemia: Continue statin.  7. Elevated TSH: T4 normal 05/19/14  Plan to discharge to home today with follow up 06-06-14.  Patient aware no driving X6 months.   Chanetta Marshall, NP 05/27/2014 6:34 AM  I have seen, examined the patient, and reviewed the above assessment and plan.  Changes to above are made where necessary.  She has done well s/p VT ablation.  Rhythm is stable.  Will discharge with amiodarone.  Hopefully we can wean this to off over the next few months.  Given cough this am, will obtain CXR.  If CXR looks ok, I anticipate discharge later today. Follow-up in 2 weeks with EP NP.  Co Sign: Thompson Grayer, MD 05/27/2014 7:57 AM

## 2014-06-02 ENCOUNTER — Emergency Department (HOSPITAL_COMMUNITY)
Admission: EM | Admit: 2014-06-02 | Discharge: 2014-06-02 | Disposition: A | Discharge status: Home / Self Care | Payer: Medicare Other | Attending: Emergency Medicine | Admitting: Emergency Medicine

## 2014-06-02 ENCOUNTER — Emergency Department (HOSPITAL_COMMUNITY): Payer: Medicare Other

## 2014-06-02 DIAGNOSIS — Z87891 Personal history of nicotine dependence: Secondary | ICD-10-CM | POA: Diagnosis not present

## 2014-06-02 DIAGNOSIS — I2510 Atherosclerotic heart disease of native coronary artery without angina pectoris: Secondary | ICD-10-CM | POA: Insufficient documentation

## 2014-06-02 DIAGNOSIS — E785 Hyperlipidemia, unspecified: Secondary | ICD-10-CM | POA: Insufficient documentation

## 2014-06-02 DIAGNOSIS — I509 Heart failure, unspecified: Secondary | ICD-10-CM | POA: Diagnosis not present

## 2014-06-02 DIAGNOSIS — R109 Unspecified abdominal pain: Secondary | ICD-10-CM | POA: Diagnosis present

## 2014-06-02 DIAGNOSIS — I252 Old myocardial infarction: Secondary | ICD-10-CM | POA: Insufficient documentation

## 2014-06-02 DIAGNOSIS — I129 Hypertensive chronic kidney disease with stage 1 through stage 4 chronic kidney disease, or unspecified chronic kidney disease: Secondary | ICD-10-CM | POA: Diagnosis not present

## 2014-06-02 DIAGNOSIS — Z95810 Presence of automatic (implantable) cardiac defibrillator: Secondary | ICD-10-CM | POA: Diagnosis not present

## 2014-06-02 DIAGNOSIS — R011 Cardiac murmur, unspecified: Secondary | ICD-10-CM | POA: Diagnosis not present

## 2014-06-02 DIAGNOSIS — R1084 Generalized abdominal pain: Secondary | ICD-10-CM

## 2014-06-02 DIAGNOSIS — Z8572 Personal history of non-Hodgkin lymphomas: Secondary | ICD-10-CM | POA: Diagnosis not present

## 2014-06-02 DIAGNOSIS — Z7982 Long term (current) use of aspirin: Secondary | ICD-10-CM | POA: Insufficient documentation

## 2014-06-02 DIAGNOSIS — Z9861 Coronary angioplasty status: Secondary | ICD-10-CM | POA: Diagnosis not present

## 2014-06-02 DIAGNOSIS — Z7901 Long term (current) use of anticoagulants: Secondary | ICD-10-CM | POA: Insufficient documentation

## 2014-06-02 DIAGNOSIS — E119 Type 2 diabetes mellitus without complications: Secondary | ICD-10-CM | POA: Insufficient documentation

## 2014-06-02 DIAGNOSIS — K219 Gastro-esophageal reflux disease without esophagitis: Secondary | ICD-10-CM | POA: Insufficient documentation

## 2014-06-02 DIAGNOSIS — Z8673 Personal history of transient ischemic attack (TIA), and cerebral infarction without residual deficits: Secondary | ICD-10-CM | POA: Insufficient documentation

## 2014-06-02 DIAGNOSIS — R112 Nausea with vomiting, unspecified: Secondary | ICD-10-CM

## 2014-06-02 DIAGNOSIS — K529 Noninfective gastroenteritis and colitis, unspecified: Secondary | ICD-10-CM | POA: Insufficient documentation

## 2014-06-02 DIAGNOSIS — Z8739 Personal history of other diseases of the musculoskeletal system and connective tissue: Secondary | ICD-10-CM | POA: Diagnosis not present

## 2014-06-02 DIAGNOSIS — F329 Major depressive disorder, single episode, unspecified: Secondary | ICD-10-CM | POA: Diagnosis not present

## 2014-06-02 DIAGNOSIS — N183 Chronic kidney disease, stage 3 (moderate): Secondary | ICD-10-CM | POA: Diagnosis not present

## 2014-06-02 LAB — CBC WITH DIFFERENTIAL/PLATELET
BASOS PCT: 0 % (ref 0–1)
Basophils Absolute: 0 10*3/uL (ref 0.0–0.1)
EOS PCT: 2 % (ref 0–5)
Eosinophils Absolute: 0.1 10*3/uL (ref 0.0–0.7)
HCT: 37.6 % (ref 36.0–46.0)
Hemoglobin: 13.3 g/dL (ref 12.0–15.0)
Lymphocytes Relative: 19 % (ref 12–46)
Lymphs Abs: 1.4 10*3/uL (ref 0.7–4.0)
MCH: 30.6 pg (ref 26.0–34.0)
MCHC: 35.4 g/dL (ref 30.0–36.0)
MCV: 86.6 fL (ref 78.0–100.0)
MONO ABS: 0.7 10*3/uL (ref 0.1–1.0)
Monocytes Relative: 9 % (ref 3–12)
NEUTROS PCT: 70 % (ref 43–77)
Neutro Abs: 5.1 10*3/uL (ref 1.7–7.7)
Platelets: 204 10*3/uL (ref 150–400)
RBC: 4.34 MIL/uL (ref 3.87–5.11)
RDW: 15.3 % (ref 11.5–15.5)
WBC: 7.4 10*3/uL (ref 4.0–10.5)

## 2014-06-02 LAB — COMPREHENSIVE METABOLIC PANEL
ALT: 31 U/L (ref 0–35)
AST: 32 U/L (ref 0–37)
Albumin: 3.6 g/dL (ref 3.5–5.2)
Alkaline Phosphatase: 51 U/L (ref 39–117)
Anion gap: 8 (ref 5–15)
BUN: 21 mg/dL (ref 6–23)
CALCIUM: 9.4 mg/dL (ref 8.4–10.5)
CHLORIDE: 104 mmol/L (ref 96–112)
CO2: 25 mmol/L (ref 19–32)
Creatinine, Ser: 1.63 mg/dL — ABNORMAL HIGH (ref 0.50–1.10)
GFR calc Af Amer: 38 mL/min — ABNORMAL LOW (ref >90)
GFR calc non Af Amer: 33 mL/min — ABNORMAL LOW (ref >90)
GLUCOSE: 116 mg/dL — AB (ref 70–99)
Potassium: 4.3 mmol/L (ref 3.5–5.1)
Sodium: 137 mmol/L (ref 135–145)
Total Bilirubin: 0.7 mg/dL (ref 0.3–1.2)
Total Protein: 7.5 g/dL (ref 6.0–8.3)

## 2014-06-02 LAB — URINALYSIS, ROUTINE W REFLEX MICROSCOPIC
Bilirubin Urine: NEGATIVE
Glucose, UA: NEGATIVE mg/dL
HGB URINE DIPSTICK: NEGATIVE
Ketones, ur: NEGATIVE mg/dL
LEUKOCYTES UA: NEGATIVE
Nitrite: NEGATIVE
PH: 7 (ref 5.0–8.0)
Protein, ur: NEGATIVE mg/dL
Specific Gravity, Urine: 1.012 (ref 1.005–1.030)
Urobilinogen, UA: 0.2 mg/dL (ref 0.0–1.0)

## 2014-06-02 LAB — I-STAT TROPONIN, ED: TROPONIN I, POC: 0.18 ng/mL — AB (ref 0.00–0.08)

## 2014-06-02 LAB — TROPONIN I: Troponin I: 0.26 ng/mL — ABNORMAL HIGH (ref <0.031)

## 2014-06-02 LAB — I-STAT CG4 LACTIC ACID, ED: LACTIC ACID, VENOUS: 1.28 mmol/L (ref 0.5–2.0)

## 2014-06-02 MED ORDER — PROMETHAZINE HCL 25 MG PO TABS: 25.0000 mg | ORAL_TABLET | Freq: Four times a day (QID) | ORAL | Status: DC | PRN

## 2014-06-02 NOTE — ED Provider Notes (Signed)
CSN: [id number]     Arrival date & time 06/02/14  7408 History  This chart was scribed for Linton Flemings, MD by Rayfield Citizen, ED Scribe. This patient was seen in room D30C/D30C and the patient's care was started at 3:40 AM.    Chief Complaint  Patient presents with  . Abdominal Pain   The history is provided by the patient. No language interpreter was used.     HPI Comments: [name] Terry is a 64 y.o. female with past medical history of anginal pain, MI, cardiomyopathy, ventricular tachycardia, AICD present, heart disease, CHF, HTN, HLD, DM II, stroke, lymphoma, kidney disease who presents to the Emergency Department complaining of sudden onset abdominal pain and vomiting around 01:00 tonight. Patient also notes pain in the "backs of her arms and legs." She woke from sleep with this pain; urinated with resulting improvement, ate with resulting improvement. She has been eating and drinking well. Her nausea and abdominal pain have resolved at present; she notes mild chest pain but reports that this present at baseline.   She explains that she had nausea yesterday morning, but took an anti-emetic (promethizine) with relief at that time. Patient admits to confusion regarding the proper timing of this medication; "it said once every eight hours, but I thought it meant once per day," which caused her to miss a dose last night; her nausea soon returned.   She notes cough and cold symptoms; congestion, general malaise.   Past Medical History  Diagnosis Date  . Heart disease   . History of stroke 33  . Myocardial infarction   . Lymphoma   . CHF (congestive heart failure)   . HTN (hypertension)   . HLD (hyperlipidemia)   . Kidney disease   . Ventricular tachycardia     CL 300 msec requiring ICD shocks therpay 5/14  . DM2 (diabetes mellitus, type 2), newly diagnosed 05/05/2013  . CKD (chronic kidney disease) stage 3, GFR 30-59 ml/min 05/05/2013  . Polymyalgia rheumatica   . Anginal pain   .  Cardiomyopathy   . AICD (automatic cardioverter/defibrillator) present   . Depression   . GERD (gastroesophageal reflux disease)    Past Surgical History  Procedure Laterality Date  . Cardiac defibrillator placement  03/2011    MDT ICD implanted at [facility] by Dr Tana Coast  . Coronary angioplasty with stent placement    . Pacemaker insertion    . Left and right heart catheterization with coronary angiogram N/A 05/03/2013    Procedure: LEFT AND RIGHT HEART CATHETERIZATION WITH CORONARY ANGIOGRAM;  Surgeon: Burnell Blanks, MD;  Location: [facility] CATH LAB;  Service: Cardiovascular;  Laterality: N/A;  . Insert / replace / remove pacemaker    . V-tach ablation N/A 05/26/2014    Procedure: V-TACH ABLATION;  Surgeon: Thompson Grayer, MD;  Location: [facility] CATH LAB;  Service: Cardiovascular;  Laterality: N/A;   Family History  Problem Relation Age of Onset  . Diabetes Father   . Hypertension Father   . Heart disease Father   . Alcoholism Father   . Hypertension Mother   . Heart disease Mother   . Breast cancer Mother   . Diabetes Brother   . Diabetes Brother   . Diabetes Brother   . Diabetes Brother   . Hypertension Sister   . Heart disease Sister   . Alcoholism Sister   . Thyroid disease Sister   . Heart attack Sister   . Heart attack Brother   . Stroke Mother   .  Stroke Brother    History  Substance Use Topics  . Smoking status: Former Smoker -- 0.25 packs/day    Quit date: 05/20/2014  . Smokeless tobacco: Never Used     Comment: she is not ready to quit  . Alcohol Use: No   OB History    No data available     Review of Systems  HENT: Positive for congestion.   Respiratory: Positive for cough.   Cardiovascular: Positive for chest pain.  Gastrointestinal: Positive for vomiting and abdominal pain.  All other systems reviewed and are negative.  Allergies  Flagyl; Contrast media; Ioxaglate; Potassium sulfate; and Potassium-containing compounds  Home Medications   Prior to  Admission medications   Medication Sig Start Date End Date Taking? Authorizing Provider  amiodarone (PACERONE) 200 MG tablet Take 400mg  twice daily for 7 days, then 200mg  twice daily for 2 weeks, then 200mg  daily 05/27/14   Chanetta Marshall, NP  amLODipine (NORVASC) 10 MG tablet Take 10 mg by mouth daily.    Historical Provider, MD  aspirin EC 81 MG tablet Take 81 mg by mouth daily.    Historical Provider, MD  atorvastatin (LIPITOR) 80 MG tablet Take 80 mg by mouth at bedtime.    Historical Provider, MD  betamethasone dipropionate (DIPROLENE) 0.05 % ointment Apply 1 application topically 3 (three) times daily as needed (dermatitis). Applies to forehead and scalp    Historical Provider, MD  clopidogrel (PLAVIX) 75 MG tablet Take 75 mg by mouth at bedtime.     Historical Provider, MD  ezetimibe (ZETIA) 10 MG tablet Take 10 mg by mouth at bedtime.     Historical Provider, MD  isosorbide mononitrate (IMDUR) 30 MG 24 hr tablet Take 30 mg by mouth daily.    Historical Provider, MD  lisinopril (PRINIVIL,ZESTRIL) 20 MG tablet Take 20 mg by mouth daily.    Historical Provider, MD  metoprolol succinate (TOPROL-XL) 50 MG 24 hr tablet Take 1 tablet (50 mg total) by mouth 2 (two) times daily. Take with or immediately following a meal. 05/27/14   Brett Canales, PA-C  Multiple Vitamin (MULTIVITAMIN WITH MINERALS) TABS Take 1 tablet by mouth at bedtime.     Historical Provider, MD  potassium chloride (K-DUR,KLOR-CON) 10 MEQ tablet Take 10 mEq by mouth daily.    Historical Provider, MD  spironolactone (ALDACTONE) 25 MG tablet Take 12.5 mg by mouth daily.    Historical Provider, MD   BP 144/82 mmHg  Pulse 62  Temp(Src) 98.1 F (36.7 C) (Oral)  Resp 17  Ht 5\' 3"  (1.6 m)  Wt 179 lb (81.194 kg)  BMI 31.72 kg/m2  SpO2 100% Physical Exam  Constitutional: She is oriented to person, place, and time. She appears well-developed and well-nourished. No distress.  Slightly confused  HENT:  Head: Normocephalic and atraumatic.   Mouth/Throat: Oropharynx is clear and moist. No oropharyngeal exudate.  Moist mucous membranes  Eyes: EOM are normal. Pupils are equal, round, and reactive to light.  Neck: Normal range of motion. Neck supple. No JVD present.  Cardiovascular: Normal rate and regular rhythm.  Exam reveals no gallop and no friction rub.   Murmur heard. Pulmonary/Chest: Effort normal and breath sounds normal. No respiratory distress. She has no wheezes. She has no rales.  Abdominal: Soft. Bowel sounds are normal. She exhibits no mass. There is tenderness (Diffuse mild tenderness). There is no rebound and no guarding.  Musculoskeletal: Normal range of motion. She exhibits no edema.  Moves all extremities normally.  Lymphadenopathy:    She has no cervical adenopathy.  Neurological: She is alert and oriented to person, place, and time. She displays normal reflexes.  Skin: Skin is warm and dry. No rash noted.  Psychiatric: She has a normal mood and affect. Her behavior is normal. Judgment and thought content normal.  Nursing note and vitals reviewed.   ED Course  Procedures   DIAGNOSTIC STUDIES: Oxygen Saturation is 100% on RA, normal by my interpretation.    COORDINATION OF CARE: 3:50 AM Discussed treatment plan with pt at bedside and pt agreed to plan.  Labs Review Labs Reviewed  I-STAT TROPOININ, ED - Abnormal; Notable for the following:    Troponin i, poc 0.18 (*)    All other components within normal limits  URINALYSIS, ROUTINE W REFLEX MICROSCOPIC  COMPREHENSIVE METABOLIC PANEL  CBC WITH DIFFERENTIAL/PLATELET  TROPONIN I  I-STAT CG4 LACTIC ACID, ED    Imaging Review No results found.   EKG Interpretation   Date/Time:  Thursday June 02 2014 03:38:58 EST Ventricular Rate:  66 PR Interval:  201 QRS Duration: 104 QT Interval:  463 QTC Calculation: 485 R Axis:   -8 Text Interpretation:  Atrial-paced rhythm Ventricular premature complex  Low voltage, precordial leads Probable left  ventricular hypertrophy  Nonspecific T abnormalities, diffuse leads No significant change since  last tracing Confirmed by Everest Brod  MD, Teagen Mcleary ([id number]) on 06/02/2014 4:05:59 AM      MDM   Final diagnoses:  None   64 year old female presents to emergency department from home via EMS with complaint of abdominal pain with nausea and vomiting.  Overall, she is a poor historian.  It appears that she ate breakfast, lunch and dinner without difficulties.  She has had intermittent nausea controlled by Phenergan but sharp, crampy abdominal pain worse tonight.  Patient has history of lymphoma, recent cardiac ablation for persistent V. tach.  She denies any shortness of breath.  She has some chest pain that she has had for the last several days.  EKG unremarkable for ischemia.  Abdomen with hyperactive bowel sounds but minimal tenderness.  Plan for labs, acute abdominal series, urine.  I personally performed the services described in this documentation, which was scribed in my presence. The recorded information has been reviewed and is accurate.    6:11 AM Case discussed with Dr. Claiborne Billings due to elevated troponins.  Patient recently had cardiac ablation for V. tach.  It is thought that elevated troponin secondary to procedure.  A heart catheterization done within the last month with minimal disease.  Patient feeling much better no further vomiting.  She is stable for discharge home.     Linton Flemings, MD 06/02/14 [phone]

## 2014-06-02 NOTE — ED Notes (Signed)
Shown lactic acid resaults to dr.otter,

## 2014-06-02 NOTE — ED Notes (Signed)
Pt states she took promethazine and tramadol around 1200 yesterday and that she didn't want to take the medication again at 8pm.

## 2014-06-02 NOTE — ED Notes (Signed)
MD at bedside. 

## 2014-06-02 NOTE — ED Notes (Signed)
Pt was given cafinated coffee by family member. Pt advised that she is not supposed to have anything else by mouth until cleared by the MD.

## 2014-06-02 NOTE — Discharge Instructions (Signed)
Abdominal Pain Many things can cause belly (abdominal) pain. Most times, the belly pain is not dangerous. Many cases of belly pain can be watched and treated at home. HOME CARE   Do not take medicines that help you go poop (laxatives) unless told to by your doctor.  Only take medicine as told by your doctor.  Eat or drink as told by your doctor. Your doctor will tell you if you should be on a special diet. GET HELP IF:  You do not know what is causing your belly pain.  You have belly pain while you are sick to your stomach (nauseous) or have runny poop (diarrhea).  You have pain while you pee or poop.  Your belly pain wakes you up at night.  You have belly pain that gets worse or better when you eat.  You have belly pain that gets worse when you eat fatty foods.  You have a fever. GET HELP RIGHT AWAY IF:   The pain does not go away within 2 hours.  You keep throwing up (vomiting).  The pain changes and is only in the right or left part of the belly.  You have bloody or tarry looking poop. MAKE SURE YOU:   Understand these instructions.  Will watch your condition.  Will get help right away if you are not doing well or get worse. Document Released: 08/28/2007 Document Revised: 03/16/2013 Document Reviewed: 11/18/2012 [facility] Patient Information 2015 [place], [place]. This information is not intended to replace advice given to you by your health care provider. Make sure you discuss any questions you have with your health care provider.  Food Choices to Help Relieve Diarrhea When you have diarrhea, the foods you eat and your eating habits are very important. Choosing the right foods and drinks can help relieve diarrhea. Also, because diarrhea can last up to 7 days, you need to replace lost fluids and electrolytes (such as sodium, potassium, and chloride) in order to help prevent dehydration.  WHAT GENERAL GUIDELINES DO I NEED TO FOLLOW?  Slowly drink 1 cup (8 oz) of fluid  for each episode of diarrhea. If you are getting enough fluid, your urine will be clear or pale yellow.  Eat starchy foods. Some good choices include white rice, white toast, pasta, low-fiber cereal, baked potatoes (without the skin), saltine crackers, and bagels.  Avoid large servings of any cooked vegetables.  Limit fruit to two servings per day. A serving is  cup or 1 small piece.  Choose foods with less than 2 g of fiber per serving.  Limit fats to less than 8 tsp (38 g) per day.  Avoid fried foods.  Eat foods that have probiotics in them. Probiotics can be found in certain dairy products.  Avoid foods and beverages that may increase the speed at which food moves through the stomach and intestines (gastrointestinal tract). Things to avoid include:  High-fiber foods, such as dried fruit, raw fruits and vegetables, nuts, seeds, and whole grain foods.  Spicy foods and high-fat foods.  Foods and beverages sweetened with high-fructose corn syrup, honey, or sugar alcohols such as xylitol, sorbitol, and mannitol. WHAT FOODS ARE RECOMMENDED? Grains White rice. White, [place], or pita breads (fresh or toasted), including plain rolls, buns, or bagels. White pasta. Saltine, soda, or graham crackers. Pretzels. Low-fiber cereal. Cooked cereals made with water (such as cornmeal, farina, or cream cereals). Plain muffins. Matzo. Melba toast. Zwieback.  Vegetables Potatoes (without the skin). Strained tomato and vegetable juices. Most well-cooked  and canned vegetables without seeds. Tender lettuce. Fruits Cooked or canned applesauce, apricots, cherries, fruit cocktail, grapefruit, peaches, pears, or plums. Fresh bananas, apples without skin, cherries, grapes, cantaloupe, grapefruit, peaches, oranges, or plums.  Meat and Other Protein Products Baked or boiled chicken. Eggs. Tofu. Fish. Seafood. Smooth peanut butter. Ground or well-cooked tender beef, ham, veal, lamb, pork, or poultry.  Dairy Plain  yogurt, kefir, and unsweetened liquid yogurt. Lactose-free milk, buttermilk, or soy milk. Plain hard cheese. Beverages Sport drinks. Clear broths. Diluted fruit juices (except prune). Regular, caffeine-free sodas such as ginger ale. Water. Decaffeinated teas. Oral rehydration solutions. Sugar-free beverages not sweetened with sugar alcohols. Other Bouillon, broth, or soups made from recommended foods.  The items listed above may not be a complete list of recommended foods or beverages. Contact your dietitian for more options. WHAT FOODS ARE NOT RECOMMENDED? Grains Whole grain, whole wheat, bran, or rye breads, rolls, pastas, crackers, and cereals. Wild or brown rice. Cereals that contain more than 2 g of fiber per serving. Corn tortillas or taco shells. Cooked or dry oatmeal. Granola. Popcorn. Vegetables Raw vegetables. Cabbage, broccoli, Brussels sprouts, artichokes, baked beans, beet greens, corn, kale, legumes, peas, sweet potatoes, and yams. Potato skins. Cooked spinach and cabbage. Fruits Dried fruit, including raisins and dates. Raw fruits. Stewed or dried prunes. Fresh apples with skin, apricots, mangoes, pears, raspberries, and strawberries.  Meat and Other Protein Products Chunky peanut butter. Nuts and seeds. Beans and lentils. Berniece Salines.  Dairy High-fat cheeses. Milk, chocolate milk, and beverages made with milk, such as milk shakes. Cream. Ice cream. Sweets and Desserts Sweet rolls, doughnuts, and sweet breads. Pancakes and waffles. Fats and Oils Butter. Cream sauces. Margarine. Salad oils. Plain salad dressings. Olives. Avocados.  Beverages Caffeinated beverages (such as coffee, tea, soda, or energy drinks). Alcoholic beverages. Fruit juices with pulp. Prune juice. Soft drinks sweetened with high-fructose corn syrup or sugar alcohols. Other Coconut. Hot sauce. Chili powder. Mayonnaise. Gravy. Cream-based or milk-based soups.  The items listed above may not be a complete list of  foods and beverages to avoid. Contact your dietitian for more information. WHAT SHOULD I DO IF I BECOME DEHYDRATED? Diarrhea can sometimes lead to dehydration. Signs of dehydration include dark urine and dry mouth and skin. If you think you are dehydrated, you should rehydrate with an oral rehydration solution. These solutions can be purchased at pharmacies, retail stores, or online.  Drink -1 cup (120-240 mL) of oral rehydration solution each time you have an episode of diarrhea. If drinking this amount makes your diarrhea worse, try drinking smaller amounts more often. For example, drink 1-3 tsp (5-15 mL) every 5-10 minutes.  A general rule for staying hydrated is to drink 1-2 L of fluid per day. Talk to your health care provider about the specific amount you should be drinking each day. Drink enough fluids to keep your urine clear or pale yellow. Document Released: 06/01/2003 Document Revised: 03/16/2013 Document Reviewed: 02/01/2013 [facility] Patient Information 2015 [place], [place]. This information is not intended to replace advice given to you by your health care provider. Make sure you discuss any questions you have with your health care provider.  Nausea and Vomiting Nausea means you feel sick to your stomach. Throwing up (vomiting) is a reflex where stomach contents come out of your mouth. HOME CARE   Take medicine as told by your doctor.  Do not force yourself to eat. However, you do need to drink fluids.  If you feel like eating, eat  a normal diet as told by your doctor.  Eat rice, wheat, potatoes, bread, lean meats, yogurt, fruits, and vegetables.  Avoid high-fat foods.  Drink enough fluids to keep your pee (urine) clear or pale yellow.  Ask your doctor how to replace body fluid losses (rehydrate). Signs of body fluid loss (dehydration) include:  Feeling very thirsty.  Dry lips and mouth.  Feeling dizzy.  Dark pee.  Peeing less than normal.  Feeling confused.  Fast  breathing or heart rate. GET HELP RIGHT AWAY IF:   You have blood in your throw up.  You have black or bloody poop (stool).  You have a bad headache or stiff neck.  You feel confused.  You have bad belly (abdominal) pain.  You have chest pain or trouble breathing.  You do not pee at least once every 8 hours.  You have cold, clammy skin.  You keep throwing up after 24 to 48 hours.  You have a fever. MAKE SURE YOU:   Understand these instructions.  Will watch your condition.  Will get help right away if you are not doing well or get worse. Document Released: 08/28/2007 Document Revised: 06/03/2011 Document Reviewed: 08/10/2010 [facility] Patient Information 2015 [place], [place]. This information is not intended to replace advice given to you by your health care provider. Make sure you discuss any questions you have with your health care provider.  Viral Gastroenteritis Viral gastroenteritis is also known as stomach flu. This condition affects the stomach and intestinal tract. It can cause sudden diarrhea and vomiting. The illness typically lasts 3 to 8 days. Most people develop an immune response that eventually gets rid of the virus. While this natural response develops, the virus can make you quite ill. CAUSES  Many different viruses can cause gastroenteritis, such as rotavirus or noroviruses. You can catch one of these viruses by consuming contaminated food or water. You may also catch a virus by sharing utensils or other personal items with an infected person or by touching a contaminated surface. SYMPTOMS  The most common symptoms are diarrhea and vomiting. These problems can cause a severe loss of body fluids (dehydration) and a body salt (electrolyte) imbalance. Other symptoms may include:  Fever.  Headache.  Fatigue.  Abdominal pain. DIAGNOSIS  Your caregiver can usually diagnose viral gastroenteritis based on your symptoms and a physical exam. A stool sample may  also be taken to test for the presence of viruses or other infections. TREATMENT  This illness typically goes away on its own. Treatments are aimed at rehydration. The most serious cases of viral gastroenteritis involve vomiting so severely that you are not able to keep fluids down. In these cases, fluids must be given through an intravenous line (IV). HOME CARE INSTRUCTIONS   Drink enough fluids to keep your urine clear or pale yellow. Drink small amounts of fluids frequently and increase the amounts as tolerated.  Ask your caregiver for specific rehydration instructions.  Avoid:  Foods high in sugar.  Alcohol.  Carbonated drinks.  Tobacco.  Juice.  Caffeine drinks.  Extremely hot or cold fluids.  Fatty, greasy foods.  Too much intake of anything at one time.  Dairy products until 24 to 48 hours after diarrhea stops.  You may consume probiotics. Probiotics are active cultures of beneficial bacteria. They may lessen the amount and number of diarrheal stools in adults. Probiotics can be found in yogurt with active cultures and in supplements.  Wash your hands well to avoid spreading the virus.  Only take over-the-counter or prescription medicines for pain, discomfort, or fever as directed by your caregiver. Do not give aspirin to children. Antidiarrheal medicines are not recommended.  Ask your caregiver if you should continue to take your regular prescribed and over-the-counter medicines.  Keep all follow-up appointments as directed by your caregiver. SEEK IMMEDIATE MEDICAL CARE IF:   You are unable to keep fluids down.  You do not urinate at least once every 6 to 8 hours.  You develop shortness of breath.  You notice blood in your stool or vomit. This may look like coffee grounds.  You have abdominal pain that increases or is concentrated in one small area (localized).  You have persistent vomiting or diarrhea.  You have a fever.  The patient is a child younger  than 3 months, and he or she has a fever.  The patient is a child older than 3 months, and he or she has a fever and persistent symptoms.  The patient is a child older than 3 months, and he or she has a fever and symptoms suddenly get worse.  The patient is a baby, and he or she has no tears when crying. MAKE SURE YOU:   Understand these instructions.  Will watch your condition.  Will get help right away if you are not doing well or get worse. Document Released: 03/11/2005 Document Revised: 06/03/2011 Document Reviewed: 12/26/2010 [facility] Patient Information 2015 [place], [place]. This information is not intended to replace advice given to you by your health care provider. Make sure you discuss any questions you have with your health care provider.

## 2014-06-02 NOTE — ED Notes (Signed)
Patient transported to X-ray 

## 2014-06-02 NOTE — ED Notes (Signed)
PT from home: pt has had abdominal pain since noon. Rates pain 5/10. Pt used restroom while EMS was on scene and stated that it did relieve some of the pain. Pt had nausea and vomiting throughout the day.

## 2014-06-03 ENCOUNTER — Encounter: Payer: Self-pay | Admitting: Nurse Practitioner

## 2014-06-03 NOTE — Progress Notes (Signed)
Electrophysiology Office Note Date: 06/06/2014  ID:  [name] Terry, DOB July 12, 1950, MRN [id number]  PCP: Bronson Curb, PA-C Primary Cardiologist: Johnsie Cancel Electrophysiologist: Allred  CC: post hospital follow-up for VT/recent ablation  [name] Terry is a 64 y.o. female seen today for Dr. Rayann Heman.  She presents today for post hospital electrophysiology followup.  Since discharge, she went to the ER with abdominal pain and vomiting, but this has since resolved after dividing doses of amiodarone.  She has not had any further palpitations or ICD shocks.  She denies chest pain, palpitations, dyspnea, PND, orthopnea, dizziness, syncope, edema, weight gain, or early satiety.   She has had persistent nasal congestion with post nasal drip and non-productive cough since discharge.  No fevers or chills.  No dyspnea.   Device History: ICD implanted 2013 for ischemic cardiomyopathy by Dr Westley Gambles at [facility] History of appropriate therapy: yes History of AAD therapy: yes, amiodarone initiated 04/2014 for VT, s/p ablation 05/2014 Dr Rayann Heman   Past Medical History  Diagnosis Date  . Ischemic cardiomyopathy     a. s/p MDT dual chamber ICD implanted 2013 by Dr Westley Gambles at [facility], now followed by Dr Rayann Heman  . History of stroke      a. 1993  . Coronary artery disease     a. s/p previous interventions in [place] per patient, no records available b. cath 04/2013 with non-obstructive disease   . Lymphoma     a. s/p chemo/radiation 2009  . CHF (congestive heart failure)   . HTN (hypertension)   . HLD (hyperlipidemia)   . Ventricular tachycardia     a. CL 300 msec requiring ICD shocks therpay 5/14 b. recurrent VT 04/2014, placed on amiodarone c. recurrent VT 05/2014 s/p ablation by Dr Rayann Heman  . DM2 (diabetes mellitus, type 2), newly diagnosed    . CKD (chronic kidney disease) stage 3, GFR 30-59 ml/min    . Polymyalgia rheumatica   . Depression   . GERD (gastroesophageal reflux disease)    Past Surgical  History  Procedure Laterality Date  . Cardiac defibrillator placement  03/2011    MDT ICD implanted at [facility] by Dr Tana Coast  . Coronary angioplasty with stent placement    . Left and right heart catheterization with coronary angiogram N/A 05/03/2013    non-obstructive CAD  . V-tach ablation N/A 05/26/2014    VT ablation by Dr Rayann Heman - PVCs arising from the inferolateral LV, extensive substrate ablation    Current Outpatient Prescriptions  Medication Sig Dispense Refill  . amiodarone (PACERONE) 200 MG tablet Take 1 tablet twice a day for 2 weeks. Then take 1 tablet daily    . amLODipine (NORVASC) 10 MG tablet Take 10 mg by mouth daily.    [name] Terry aspirin EC 81 MG tablet Take 81 mg by mouth daily.    [name] Terry atorvastatin (LIPITOR) 80 MG tablet Take 80 mg by mouth at bedtime.    . betamethasone dipropionate (DIPROLENE) 0.05 % ointment Apply 1 application topically 3 (three) times daily as needed (dermatitis). Applies to forehead and scalp    . clopidogrel (PLAVIX) 75 MG tablet Take 75 mg by mouth at bedtime.     [name] Terry ezetimibe (ZETIA) 10 MG tablet Take 10 mg by mouth at bedtime.     . isosorbide mononitrate (IMDUR) 30 MG 24 hr tablet Take 30 mg by mouth daily.    [name] Terry lisinopril (PRINIVIL,ZESTRIL) 20 MG tablet Take 20 mg by mouth daily.    . metoprolol succinate (TOPROL-XL)  50 MG 24 hr tablet Take 1 tablet (50 mg total) by mouth 2 (two) times daily. Take with or immediately following a meal. 180 tablet 0  . Multiple Vitamin (MULTIVITAMIN WITH MINERALS) TABS Take 1 tablet by mouth at bedtime.     . pantoprazole (PROTONIX) 40 MG tablet Take 40 mg by mouth daily.    . potassium chloride (K-DUR,KLOR-CON) 10 MEQ tablet Take 10 mEq by mouth daily.    . promethazine (PHENERGAN) 25 MG tablet Take 1 tablet (25 mg total) by mouth every 6 (six) hours as needed for nausea or vomiting. 30 tablet 0  . spironolactone (ALDACTONE) 25 MG tablet Take 25 mg by mouth daily.      No current facility-administered medications for this  visit.    Allergies:   Flagyl; Contrast media; Ioxaglate; Potassium sulfate; and Potassium-containing compounds   Social History: History   Social History  . Marital Status: Single    Spouse Name: N/A  . Number of Children: 2  . Years of Education: N/A   Occupational History  . Not on file.   Social History Main Topics  . Smoking status: Former Smoker -- 0.25 packs/day    Quit date: 05/20/2014  . Smokeless tobacco: Never Used     Comment: she is not ready to quit  . Alcohol Use: No  . Drug Use: No  . Sexual Activity: Not on file   Other Topics Concern  . Not on file   Social History Narrative    Family History: Family History  Problem Relation Age of Onset  . Diabetes Father   . Hypertension Father   . Heart disease Father   . Alcoholism Father   . Hypertension Mother   . Heart disease Mother   . Breast cancer Mother   . Diabetes Brother   . Diabetes Brother   . Diabetes Brother   . Diabetes Brother   . Hypertension Sister   . Heart disease Sister   . Alcoholism Sister   . Thyroid disease Sister   . Heart attack Sister   . Heart attack Brother   . Stroke Mother   . Stroke Brother     Review of Systems: General: No chills, fever, night sweats or weight changes  Cardiovascular:  No chest pain, dyspnea on exertion, edema, orthopnea, palpitations, paroxysmal nocturnal dyspnea; denies ICD shocks Dermatological: No rash, lesions or masses Respiratory: + non-productive cough, dyspnea Urologic: No hematuria, dysuria Abdominal: No nausea, vomiting, diarrhea, bright red blood per rectum, melena, or hematemesis Neurologic: No visual changes, weakness, changes in mental status All other systems reviewed and are otherwise negative except as noted above.   Physical Exam: VS:  BP 112/72 mmHg  Pulse 69  Ht 5\' 5"  (1.651 m)  Wt 182 lb 12.8 oz (82.918 kg)  BMI 30.42 kg/m2  SpO2 97% , BMI Body mass index is 30.42 kg/(m^2).  GEN- The patient is well appearing,  alert and oriented x 3 today.   HEENT: normocephalic, atraumatic; sclera clear, conjunctiva pink; hearing intact; oropharynx clear; neck supple, no JVP Lymph- no cervical lymphadenopathy Lungs- Clear to ausculation bilaterally, normal work of breathing.  No wheezes, rales, rhonchi Heart- Regular rate and rhythm, 2/6 SEM, rubs or gallops, PMI not laterally displaced GI- soft, non-tender, non-distended, bowel sounds present, no hepatosplenomegaly Extremities- no clubbing, cyanosis, or edema; DP/PT/radial pulses 1+ bilaterally MS- no significant deformity or atrophy Skin- warm and dry, no rash or lesion; ICD pocket well healed Psych- euthymic mood, full affect Neuro-  strength and sensation are intact  ICD interrogation- reviewed in detail today,  See PACEART report  Recent Labs: 05/19/2014: Magnesium 2.5; TSH 12.116* 06/02/2014: ALT 31; BUN 21; Creatinine 1.63*; Hemoglobin 13.3; Platelets 204; Potassium 4.3; Sodium 137   Wt Readings from Last 3 Encounters:  06/06/14 182 lb 12.8 oz (82.918 kg)  06/02/14 179 lb (81.194 kg)  05/27/14 190 lb 0.6 oz (86.2 kg)     Other studies Reviewed: Additional studies/ records that were reviewed today include: hospital records, ER records, EKG 06/02/14   Assessment and Plan:  1.  Ventricular tachycardia No recurrence since ablation 05/2014 32 PVC's per hour on device interrogation today.  Groin without complication today Decrease amiodarone to 200mg  bid x2 weeks then amiodarone 200mg  daily - LFT's slightly elevated/TSH normal during admission 04/2014; eye exam overdue (pt advised to call for appt), PFT's ordered today. Will recheck LFT's today.  No driving X6 months from 05/2014 admission (pt aware)  2.  Chronic systolic dysfunction euvolemic today Stable on an appropriate medical regimen Normal ICD function See Pace Art report No changes today  3.  CAD/ICM No recent ischemic symptoms Continue medical therapy as above Pt needs EGD, advised today  would prefer to continue Plavix uninterrupted.  If medically necessary to hold, ok to hold for 7 days. Note given to patient today to take to GI MD in [place], [place]. (Discussed with Dr Rayann Heman)  4.  HTN Stable No change required today  5.  Cough Persistent since discharge associated with nasal drainage and post nasal drip. Lungs clear today.  No fevers/chills/dyspnea Pt advised if cough not resolved in 1 week or if other symptoms develop to call office for repeat CXR/primary care evaluation.     Current medicines are reviewed at length with the patient today.   The patient does not have concerns regarding her medicines.  The following changes were made today:  Decrease amiodarone to 200mg  twice daily for 2 weeks then 1 tablet daily  Labs/ tests ordered today include:  Orders Placed This Encounter  Procedures  . Hepatic function panel  . Implantable device check  . Pulmonary function test     Disposition:   FU with Dr Rayann Heman 2 weeks   Signed, Chanetta Marshall, NP 06/06/2014 8:59 AM  [address] [place] [address] [phone] (office) [phone] (fax

## 2014-06-03 NOTE — Patient Instructions (Addendum)
Your physician has recommended you make the following change in your medication:  1) TAKE Amiodarone 200mg  twice daily for 2 weeks. Then take 1 tablet 200mg  daily  Lab Today: Lft  Your physician has recommended that you have a pulmonary function test. Pulmonary Function Tests are a group of tests that measure how well air moves in and out of your lungs.  We recommend that you have a eye exam  Call the office next week if your cough has not improved  Follow up with Dr.Allred 06/20/14 @ 10:45am as planned

## 2014-06-06 ENCOUNTER — Encounter: Payer: Self-pay | Admitting: Internal Medicine

## 2014-06-06 ENCOUNTER — Ambulatory Visit (INDEPENDENT_AMBULATORY_CARE_PROVIDER_SITE_OTHER): Payer: Medicare Other | Admitting: Nurse Practitioner

## 2014-06-06 ENCOUNTER — Encounter: Payer: Self-pay | Admitting: Nurse Practitioner

## 2014-06-06 VITALS — BP 112/72 | HR 69 | Ht 65.0 in | Wt 182.8 lb

## 2014-06-06 DIAGNOSIS — I110 Hypertensive heart disease with heart failure: Secondary | ICD-10-CM

## 2014-06-06 DIAGNOSIS — I472 Ventricular tachycardia, unspecified: Secondary | ICD-10-CM

## 2014-06-06 DIAGNOSIS — I509 Heart failure, unspecified: Secondary | ICD-10-CM | POA: Diagnosis not present

## 2014-06-06 DIAGNOSIS — I255 Ischemic cardiomyopathy: Secondary | ICD-10-CM

## 2014-06-06 DIAGNOSIS — I5022 Chronic systolic (congestive) heart failure: Secondary | ICD-10-CM

## 2014-06-06 DIAGNOSIS — Z95810 Presence of automatic (implantable) cardiac defibrillator: Secondary | ICD-10-CM | POA: Diagnosis not present

## 2014-06-06 DIAGNOSIS — Z79899 Other long term (current) drug therapy: Secondary | ICD-10-CM | POA: Diagnosis not present

## 2014-06-06 LAB — MDC_IDC_ENUM_SESS_TYPE_INCLINIC
Battery Voltage: 3.01 V
Brady Statistic AP VS Percent: 94.44 %
Brady Statistic AS VP Percent: 0 %
Date Time Interrogation Session: 20160314085600
HIGH POWER IMPEDANCE MEASURED VALUE: 285 Ohm
HighPow Impedance: 171 Ohm
HighPow Impedance: 59 Ohm
Lead Channel Impedance Value: 418 Ohm
Lead Channel Pacing Threshold Amplitude: 0.75 V
Lead Channel Pacing Threshold Amplitude: 0.75 V
Lead Channel Pacing Threshold Pulse Width: 0.4 ms
Lead Channel Sensing Intrinsic Amplitude: 3.25 mV
Lead Channel Sensing Intrinsic Amplitude: 9.375 mV
MDC IDC MSMT LEADCHNL RV IMPEDANCE VALUE: 361 Ohm
MDC IDC MSMT LEADCHNL RV PACING THRESHOLD PULSEWIDTH: 0.4 ms
MDC IDC SET LEADCHNL RA PACING AMPLITUDE: 2 V
MDC IDC SET LEADCHNL RV PACING AMPLITUDE: 2.5 V
MDC IDC SET LEADCHNL RV PACING PULSEWIDTH: 0.4 ms
MDC IDC SET LEADCHNL RV SENSING SENSITIVITY: 0.3 mV
MDC IDC SET ZONE DETECTION INTERVAL: 350 ms
MDC IDC SET ZONE DETECTION INTERVAL: 470 ms
MDC IDC STAT BRADY AP VP PERCENT: 0.07 %
MDC IDC STAT BRADY AS VS PERCENT: 5.48 %
MDC IDC STAT BRADY RA PERCENT PACED: 94.51 %
MDC IDC STAT BRADY RV PERCENT PACED: 0.08 %
Zone Setting Detection Interval: 270 ms
Zone Setting Detection Interval: 280 ms
Zone Setting Detection Interval: 440 ms

## 2014-06-06 LAB — HEPATIC FUNCTION PANEL
ALT: 47 U/L — ABNORMAL HIGH (ref 0–35)
AST: 44 U/L — ABNORMAL HIGH (ref 0–37)
Albumin: 4 g/dL (ref 3.5–5.2)
Alkaline Phosphatase: 55 U/L (ref 39–117)
BILIRUBIN DIRECT: 0.1 mg/dL (ref 0.0–0.3)
BILIRUBIN TOTAL: 0.5 mg/dL (ref 0.2–1.2)
Total Protein: 7.5 g/dL (ref 6.0–8.3)

## 2014-06-06 MED ORDER — AMIODARONE HCL 200 MG PO TABS: ORAL_TABLET | ORAL | Status: DC

## 2014-06-14 ENCOUNTER — Ambulatory Visit (HOSPITAL_COMMUNITY)
Admission: RE | Admit: 2014-06-14 | Discharge: 2014-06-14 | Disposition: A | Discharge status: Home / Self Care | Payer: Medicare Other | Source: Ambulatory Visit | Attending: Nurse Practitioner | Admitting: Nurse Practitioner

## 2014-06-14 DIAGNOSIS — I472 Ventricular tachycardia, unspecified: Secondary | ICD-10-CM

## 2014-06-14 DIAGNOSIS — R0609 Other forms of dyspnea: Secondary | ICD-10-CM | POA: Diagnosis not present

## 2014-06-14 DIAGNOSIS — I110 Hypertensive heart disease with heart failure: Secondary | ICD-10-CM

## 2014-06-14 DIAGNOSIS — F17210 Nicotine dependence, cigarettes, uncomplicated: Secondary | ICD-10-CM | POA: Insufficient documentation

## 2014-06-14 DIAGNOSIS — I255 Ischemic cardiomyopathy: Secondary | ICD-10-CM

## 2014-06-14 DIAGNOSIS — R05 Cough: Secondary | ICD-10-CM | POA: Diagnosis not present

## 2014-06-14 DIAGNOSIS — Z79899 Other long term (current) drug therapy: Secondary | ICD-10-CM

## 2014-06-14 LAB — PULMONARY FUNCTION TEST
DL/VA % PRED: 92 %
DL/VA: 4.57 ml/min/mmHg/L
DLCO cor % pred: 68 %
DLCO cor: 17.54 ml/min/mmHg
DLCO unc % pred: 66 %
DLCO unc: 17.15 ml/min/mmHg
FEF 25-75 PRE: 1.91 L/s
FEF 25-75 Post: 2.84 L/sec
FEF2575-%CHANGE-POST: 48 %
FEF2575-%PRED-POST: 139 %
FEF2575-%Pred-Pre: 93 %
FEV1-%CHANGE-POST: 13 %
FEV1-%PRED-POST: 92 %
FEV1-%Pred-Pre: 81 %
FEV1-Post: 1.96 L
FEV1-Pre: 1.72 L
FEV1FVC-%Change-Post: 0 %
FEV1FVC-%Pred-Pre: 105 %
FEV6-%Change-Post: 13 %
FEV6-%PRED-PRE: 79 %
FEV6-%Pred-Post: 89 %
FEV6-POST: 2.34 L
FEV6-Pre: 2.07 L
FEV6FVC-%PRED-POST: 103 %
FEV6FVC-%Pred-Pre: 103 %
FVC-%CHANGE-POST: 13 %
FVC-%PRED-POST: 86 %
FVC-%PRED-PRE: 76 %
FVC-POST: 2.34 L
FVC-Pre: 2.07 L
PRE FEV1/FVC RATIO: 83 %
PRE FEV6/FVC RATIO: 100 %
Post FEV1/FVC ratio: 84 %
Post FEV6/FVC ratio: 100 %
RV % PRED: 84 %
RV: 1.76 L
TLC % PRED: 80 %
TLC: 4.18 L

## 2014-06-14 LAB — BLOOD GAS, ARTERIAL
ACID-BASE DEFICIT: 5.8 mmol/L — AB (ref 0.0–2.0)
BICARBONATE: 18.5 meq/L — AB (ref 20.0–24.0)
DRAWN BY: 242311
FIO2: 0.21 %
O2 Saturation: 96.9 %
PCO2 ART: 32.9 mmHg — AB (ref 35.0–45.0)
PO2 ART: 95.9 mmHg (ref 80.0–100.0)
Patient temperature: 98.6
TCO2: 19.5 mmol/L (ref 0–100)
pH, Arterial: 7.369 (ref 7.350–7.450)

## 2014-06-14 MED ORDER — ALBUTEROL SULFATE (2.5 MG/3ML) 0.083% IN NEBU
2.5000 mg | INHALATION_SOLUTION | Freq: Once | RESPIRATORY_TRACT | Status: AC
  Administered 2014-06-14: 2.5 mg via RESPIRATORY_TRACT

## 2014-06-15 ENCOUNTER — Encounter (HOSPITAL_COMMUNITY): Payer: Self-pay | Admitting: Emergency Medicine

## 2014-06-15 ENCOUNTER — Emergency Department (HOSPITAL_COMMUNITY): Payer: Medicare Other

## 2014-06-15 ENCOUNTER — Emergency Department (HOSPITAL_COMMUNITY)
Admission: EM | Admit: 2014-06-15 | Discharge: 2014-06-15 | Disposition: A | Discharge status: Home / Self Care | Payer: Medicare Other | Attending: Emergency Medicine | Admitting: Emergency Medicine

## 2014-06-15 DIAGNOSIS — N183 Chronic kidney disease, stage 3 (moderate): Secondary | ICD-10-CM | POA: Diagnosis not present

## 2014-06-15 DIAGNOSIS — Z8579 Personal history of other malignant neoplasms of lymphoid, hematopoietic and related tissues: Secondary | ICD-10-CM | POA: Insufficient documentation

## 2014-06-15 DIAGNOSIS — Z7982 Long term (current) use of aspirin: Secondary | ICD-10-CM | POA: Diagnosis not present

## 2014-06-15 DIAGNOSIS — Z95810 Presence of automatic (implantable) cardiac defibrillator: Secondary | ICD-10-CM | POA: Insufficient documentation

## 2014-06-15 DIAGNOSIS — R1032 Left lower quadrant pain: Secondary | ICD-10-CM | POA: Diagnosis present

## 2014-06-15 DIAGNOSIS — R079 Chest pain, unspecified: Secondary | ICD-10-CM | POA: Insufficient documentation

## 2014-06-15 DIAGNOSIS — Z8673 Personal history of transient ischemic attack (TIA), and cerebral infarction without residual deficits: Secondary | ICD-10-CM | POA: Diagnosis not present

## 2014-06-15 DIAGNOSIS — Z9861 Coronary angioplasty status: Secondary | ICD-10-CM | POA: Diagnosis not present

## 2014-06-15 DIAGNOSIS — Z79899 Other long term (current) drug therapy: Secondary | ICD-10-CM | POA: Insufficient documentation

## 2014-06-15 DIAGNOSIS — I129 Hypertensive chronic kidney disease with stage 1 through stage 4 chronic kidney disease, or unspecified chronic kidney disease: Secondary | ICD-10-CM | POA: Insufficient documentation

## 2014-06-15 DIAGNOSIS — R1110 Vomiting, unspecified: Secondary | ICD-10-CM | POA: Insufficient documentation

## 2014-06-15 DIAGNOSIS — K219 Gastro-esophageal reflux disease without esophagitis: Secondary | ICD-10-CM | POA: Insufficient documentation

## 2014-06-15 DIAGNOSIS — E785 Hyperlipidemia, unspecified: Secondary | ICD-10-CM | POA: Diagnosis not present

## 2014-06-15 DIAGNOSIS — Z8659 Personal history of other mental and behavioral disorders: Secondary | ICD-10-CM | POA: Insufficient documentation

## 2014-06-15 DIAGNOSIS — Z9889 Other specified postprocedural states: Secondary | ICD-10-CM | POA: Diagnosis not present

## 2014-06-15 DIAGNOSIS — I2510 Atherosclerotic heart disease of native coronary artery without angina pectoris: Secondary | ICD-10-CM | POA: Insufficient documentation

## 2014-06-15 DIAGNOSIS — Z7902 Long term (current) use of antithrombotics/antiplatelets: Secondary | ICD-10-CM | POA: Diagnosis not present

## 2014-06-15 DIAGNOSIS — E119 Type 2 diabetes mellitus without complications: Secondary | ICD-10-CM | POA: Diagnosis not present

## 2014-06-15 DIAGNOSIS — Z87891 Personal history of nicotine dependence: Secondary | ICD-10-CM | POA: Diagnosis not present

## 2014-06-15 LAB — CBC WITH DIFFERENTIAL/PLATELET
Basophils Absolute: 0 10*3/uL (ref 0.0–0.1)
Basophils Relative: 0 % (ref 0–1)
EOS ABS: 0.2 10*3/uL (ref 0.0–0.7)
EOS PCT: 2 % (ref 0–5)
HCT: 35.3 % — ABNORMAL LOW (ref 36.0–46.0)
HEMOGLOBIN: 11.9 g/dL — AB (ref 12.0–15.0)
LYMPHS ABS: 1.7 10*3/uL (ref 0.7–4.0)
LYMPHS PCT: 17 % (ref 12–46)
MCH: 30.4 pg (ref 26.0–34.0)
MCHC: 33.7 g/dL (ref 30.0–36.0)
MCV: 90.1 fL (ref 78.0–100.0)
MONO ABS: 0.9 10*3/uL (ref 0.1–1.0)
Monocytes Relative: 10 % (ref 3–12)
Neutro Abs: 7 10*3/uL (ref 1.7–7.7)
Neutrophils Relative %: 71 % (ref 43–77)
Platelets: 197 10*3/uL (ref 150–400)
RBC: 3.92 MIL/uL (ref 3.87–5.11)
RDW: 16.2 % — ABNORMAL HIGH (ref 11.5–15.5)
WBC: 9.8 10*3/uL (ref 4.0–10.5)

## 2014-06-15 LAB — COMPREHENSIVE METABOLIC PANEL
ALBUMIN: 3.2 g/dL — AB (ref 3.5–5.2)
ALK PHOS: 46 U/L (ref 39–117)
ALT: 31 U/L (ref 0–35)
AST: 28 U/L (ref 0–37)
Anion gap: 6 (ref 5–15)
BILIRUBIN TOTAL: 0.5 mg/dL (ref 0.3–1.2)
BUN: 31 mg/dL — ABNORMAL HIGH (ref 6–23)
CHLORIDE: 109 mmol/L (ref 96–112)
CO2: 24 mmol/L (ref 19–32)
CREATININE: 2.38 mg/dL — AB (ref 0.50–1.10)
Calcium: 9 mg/dL (ref 8.4–10.5)
GFR calc Af Amer: 24 mL/min — ABNORMAL LOW (ref >90)
GFR calc non Af Amer: 21 mL/min — ABNORMAL LOW (ref >90)
Glucose, Bld: 120 mg/dL — ABNORMAL HIGH (ref 70–99)
Potassium: 4.7 mmol/L (ref 3.5–5.1)
Sodium: 139 mmol/L (ref 135–145)
Total Protein: 6.3 g/dL (ref 6.0–8.3)

## 2014-06-15 LAB — URINALYSIS, ROUTINE W REFLEX MICROSCOPIC
BILIRUBIN URINE: NEGATIVE
Glucose, UA: NEGATIVE mg/dL
Hgb urine dipstick: NEGATIVE
Ketones, ur: NEGATIVE mg/dL
LEUKOCYTES UA: NEGATIVE
Nitrite: NEGATIVE
PH: 5.5 (ref 5.0–8.0)
Protein, ur: NEGATIVE mg/dL
SPECIFIC GRAVITY, URINE: 1.019 (ref 1.005–1.030)
UROBILINOGEN UA: 0.2 mg/dL (ref 0.0–1.0)

## 2014-06-15 LAB — LIPASE, BLOOD: LIPASE: 50 U/L (ref 11–59)

## 2014-06-15 NOTE — ED Provider Notes (Signed)
CSN: [id number]     Arrival date & time 06/15/14  1308 History   First MD Initiated Contact with Patient 06/15/14 1312     Chief Complaint  Patient presents with  . Chest Pain   (Consider location/radiation/quality/duration/timing/severity/associated sxs/prior Treatment) HPI [name] Terry is a 64 year old female presenting with multiple complaints. She states she was awoken from sleep about 1 AM last night with left lower quadrant abdominal pain. The same time as the pain, she felt her feet felt very cold. The pain has improved some but not completely resolved.  She reports some episodes of vomiting recently also, the last episode was yesterday. Her last bm was yesterday and normal.  She endorses 2/10 chest pain but this has been ongoing since 1989.  She denies any fevers, chills, diarrhea, or shortness of breath.   Past Medical History  Diagnosis Date  . Ischemic cardiomyopathy     a. s/p MDT dual chamber ICD implanted 2013 by Dr Westley Gambles at [facility], now followed by Dr Rayann Heman  . History of stroke      a. 1993  . Coronary artery disease     a. s/p previous interventions in [place] per patient, no records available b. cath 04/2013 with non-obstructive disease   . Lymphoma     a. s/p chemo/radiation 2009  . CHF (congestive heart failure)   . HTN (hypertension)   . HLD (hyperlipidemia)   . Ventricular tachycardia     a. CL 300 msec requiring ICD shocks therpay 5/14 b. recurrent VT 04/2014, placed on amiodarone c. recurrent VT 05/2014 s/p ablation by Dr Rayann Heman  . DM2 (diabetes mellitus, type 2), newly diagnosed    . CKD (chronic kidney disease) stage 3, GFR 30-59 ml/min    . Polymyalgia rheumatica   . Depression   . GERD (gastroesophageal reflux disease)    Past Surgical History  Procedure Laterality Date  . Cardiac defibrillator placement  03/2011    MDT ICD implanted at [facility] by Dr Tana Coast  . Coronary angioplasty with stent placement    . Left and right heart catheterization with coronary  angiogram N/A 05/03/2013    non-obstructive CAD  . V-tach ablation N/A 05/26/2014    VT ablation by Dr Rayann Heman - PVCs arising from the inferolateral LV, extensive substrate ablation   Family History  Problem Relation Age of Onset  . Diabetes Father   . Hypertension Father   . Heart disease Father   . Alcoholism Father   . Hypertension Mother   . Heart disease Mother   . Breast cancer Mother   . Diabetes Brother   . Diabetes Brother   . Diabetes Brother   . Diabetes Brother   . Hypertension Sister   . Heart disease Sister   . Alcoholism Sister   . Thyroid disease Sister   . Heart attack Sister   . Heart attack Brother   . Stroke Mother   . Stroke Brother    History  Substance Use Topics  . Smoking status: Former Smoker -- 0.25 packs/day    Quit date: 05/20/2014  . Smokeless tobacco: Never Used     Comment: she is not ready to quit  . Alcohol Use: No   OB History    No data available     Review of Systems  Constitutional: Negative for fever and chills.  HENT: Negative for sore throat.   Eyes: Negative for visual disturbance.  Respiratory: Negative for cough and shortness of breath.   Cardiovascular: Negative for  chest pain and leg swelling.  Gastrointestinal: Positive for vomiting and abdominal pain. Negative for nausea and diarrhea.  Genitourinary: Negative for dysuria.  Musculoskeletal: Negative for myalgias.  Skin: Negative for rash.  Neurological: Negative for weakness, numbness and headaches.      Allergies  Flagyl; Contrast media; Ioxaglate; Potassium sulfate; and Potassium-containing compounds  Home Medications   Prior to Admission medications   Medication Sig Start Date End Date Taking? Authorizing Provider  amiodarone (PACERONE) 200 MG tablet Take 1 tablet twice a day for 2 weeks. Then take 1 tablet daily 06/06/14   Chanetta Marshall, NP  amLODipine (NORVASC) 10 MG tablet Take 10 mg by mouth daily.    Historical Provider, MD  aspirin EC 81 MG tablet Take 81  mg by mouth daily.    Historical Provider, MD  atorvastatin (LIPITOR) 80 MG tablet Take 80 mg by mouth at bedtime.    Historical Provider, MD  betamethasone dipropionate (DIPROLENE) 0.05 % ointment Apply 1 application topically 3 (three) times daily as needed (dermatitis). Applies to forehead and scalp    Historical Provider, MD  clopidogrel (PLAVIX) 75 MG tablet Take 75 mg by mouth at bedtime.     Historical Provider, MD  ezetimibe (ZETIA) 10 MG tablet Take 10 mg by mouth at bedtime.     Historical Provider, MD  isosorbide mononitrate (IMDUR) 30 MG 24 hr tablet Take 30 mg by mouth daily.    Historical Provider, MD  lisinopril (PRINIVIL,ZESTRIL) 20 MG tablet Take 20 mg by mouth daily.    Historical Provider, MD  metoprolol succinate (TOPROL-XL) 50 MG 24 hr tablet Take 1 tablet (50 mg total) by mouth 2 (two) times daily. Take with or immediately following a meal. 05/27/14   Brett Canales, PA-C  Multiple Vitamin (MULTIVITAMIN WITH MINERALS) TABS Take 1 tablet by mouth at bedtime.     Historical Provider, MD  pantoprazole (PROTONIX) 40 MG tablet Take 40 mg by mouth daily.    Historical Provider, MD  potassium chloride (K-DUR,KLOR-CON) 10 MEQ tablet Take 10 mEq by mouth daily.    Historical Provider, MD  promethazine (PHENERGAN) 25 MG tablet Take 1 tablet (25 mg total) by mouth every 6 (six) hours as needed for nausea or vomiting. 06/02/14   Linton Flemings, MD  spironolactone (ALDACTONE) 25 MG tablet Take 25 mg by mouth daily.     Historical Provider, MD   BP 110/60 mmHg  Temp(Src) 99.6 F (37.6 C) (Oral)  Resp 16  SpO2 97% Physical Exam  Constitutional: She appears well-developed and well-nourished. No distress.  HENT:  Head: Normocephalic and atraumatic.  Mouth/Throat: Oropharynx is clear and moist. No oropharyngeal exudate.  Eyes: Conjunctivae are normal.  Neck: Neck supple. No thyromegaly present.  Cardiovascular: Normal rate, regular rhythm and intact distal pulses.   Pulmonary/Chest: Effort  normal and breath sounds normal. No respiratory distress. She has no wheezes. She has no rales. She exhibits no tenderness.  Abdominal: Soft. There is tenderness in the left lower quadrant. There is no rigidity, no rebound, no guarding, no CVA tenderness, no tenderness at McBurney's point and negative Murphy's sign.    Musculoskeletal: She exhibits no tenderness.  Lymphadenopathy:    She has no cervical adenopathy.  Neurological: She is alert.  Skin: Skin is warm and dry. No rash noted. She is not diaphoretic.  Psychiatric: She has a normal mood and affect.  Nursing note and vitals reviewed.   ED Course  Procedures (including critical care time) Labs Review Labs Reviewed  CBC WITH DIFFERENTIAL/PLATELET - Abnormal; Notable for the following:    Hemoglobin 11.9 (*)    HCT 35.3 (*)    RDW 16.2 (*)    All other components within normal limits  COMPREHENSIVE METABOLIC PANEL - Abnormal; Notable for the following:    Glucose, Bld 120 (*)    BUN 31 (*)    Creatinine, Ser 2.38 (*)    Albumin 3.2 (*)    GFR calc non Af Amer 21 (*)    GFR calc Af Amer 24 (*)    All other components within normal limits  URINALYSIS, ROUTINE W REFLEX MICROSCOPIC  LIPASE, BLOOD   Imaging Review Ct Abdomen Pelvis Wo Contrast  06/15/2014   CLINICAL DATA:  Left lower quadrant pain for 1 day, possible stone  EXAM: CT ABDOMEN AND PELVIS WITHOUT CONTRAST  TECHNIQUE: Multidetector CT imaging of the abdomen and pelvis was performed following the standard protocol without IV contrast.  COMPARISON:  None.  FINDINGS: Atherosclerotic calcifications of coronary arteries. Partially visualized cardiac pacemaker leads. The lung bases are unremarkable. Sagittal images of the spine shows mild degenerative changes thoracolumbar spine. Small hiatal hernia. Extensive atherosclerotic calcifications of abdominal aorta. Atherosclerotic calcifications of SMA. Bilateral renal artery origin stent is noted.  The study is limited without IV  contrast.  No aortic aneurysm. Unenhanced liver shows no biliary ductal dilatation. No calcified gallstones are noted within gallbladder.  Unenhanced pancreas, spleen and adrenal glands are unremarkable. There is a probable cyst in midpole of the right kidney measures 8 mm. A cyst in lower pole of the right kidney measures 5.5 mm. No nephrolithiasis. Probable cyst in midpole posterior aspect of the left kidney measures 1.4 cm. Probable cyst in mid to lower pole anterior aspect of the left kidney measures 1.8 cm.  No hydronephrosis or hydroureter. Bilateral no calcified ureteral calculi are noted.  No small bowel obstruction. No thickened or dilated small bowel loops. Moderate stool noted within right colon and transverse colon. No pericecal inflammation. Normal appendix.  Scattered diverticula are noted proximal and mid left colon. Multiple colonic diverticula noted distal left colon and proximal sigmoid colon. There is no evidence of acute diverticulitis. No pericolonic abscess. No mesenteric fluid collection.  The unenhanced uterus and left adnexa are unremarkable. Pelvic phleboliths are noted. There is a fat containing cystic lesion in right adnexa are with some peripheral calcifications. Measures about 2.7 cm. 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 Bilateral distal ureter is unremarkable. No calcified calculi are noted within urinary bladder. Atherosclerotic calcification bilateral common iliac arteries.  IMPRESSION: 1. There is no nephrolithiasis.  No hydronephrosis or hydroureter. 2. Bilateral probable renal cysts. The largest cyst in lower pole of the right kidney measures 5.5 cm. 3. No calcified ureteral calculi. 4. Normal appendix.  No pericecal inflammation. 5. Extensive atherosclerotic calcifications of abdominal aorta. SMA calcifications are noted. 6. No small bowel obstruction. 7. Distal left colon and proximal sigmoid colon diverticula are noted. No evidence of acute diverticulitis. No  pericolonic abscess. 8. Question cystic teratoma/dermoid in right adnexa measures 2.7 cm.   Electronically Signed   By: Lahoma Crocker M.D.   On: 06/15/2014 15:14     EKG Interpretation None      MDM   Final diagnoses:  LLQ pain   64 yo with now resolved LLQ pain but mildly TTP LLQ and reports of vomiting episode 2 days ago. Despite initial chief complaint, pt's chest pain is the same as her normal chest pain since 1989 and  is not her concern for this visit.  She has not had any shortness of breath or worsening of symptoms with exertion. Due to her age and exam, CT abd/pelvis ordered, but due to her chronic kidney disease, will be done without contrast.  Pt is comfortable throughout her ED visit and declined pain meds.  Imaging reviewed showing no kidney stones, no bowel obstruction and no concern for diverticulitis.  Incidental findings of adnexal cyst and atherosclerosis discussed with pt and encouraged routine follow-up with PCP to address. Labs without significant abnormality except for worsening BUN and creatinine from pt's baseline to 31 and 2.38, respectively. Her potassium is normal and no immediate intervention needed but findings discussed with pt and encouraged to follow-up with PCP and nephrologist.  On repeat exam patient does not have a surgical abdomen and there are no peritoneal signs.  No indication of appendicitis, bowel obstruction, bowel perforation, cholecystitis, diverticulitis. Pt is well-appearing, in no acute distress and vital signs reviewed. I have also discussed reasons to return immediately to the ER.  Patient expresses understanding and agrees with plan.   Filed Vitals:   06/15/14 1310 06/15/14 1330 06/15/14 1542 06/15/14 1606  BP: 110/60 119/63 103/64   Pulse:  53 62   Temp: 99.6 F (37.6 C)   98.9 F (37.2 C)  TempSrc: Oral     Resp: 16 11 16    SpO2: 97% 100% 100%    Meds given in ED:  Medications - No data to display  Discharge Medication List as of  06/15/2014  3:43 PM        Britt Bottom, NP 06/16/14 [place], MD 06/16/14 1527

## 2014-06-15 NOTE — Discharge Instructions (Signed)
Please follow the directions provided. Be sure to follow-up with your primary care doctor to ensure you're getting better. Also follow-up with your kidney doctor to discuss your increasing BUN/creatinine. Your potassium was normal on your labs today however. Don't hesitate to return for any new, worsening, or concerning symptoms.   SEEK IMMEDIATE MEDICAL CARE IF:  Your pain does not go away within 2 hours.  You keep throwing up (vomiting).  Your pain is felt only in portions of the abdomen, such as the right side or the left lower portion of the abdomen.  You pass bloody or black tarry stools.

## 2014-06-15 NOTE — ED Notes (Signed)
Per EMS, pt coming in today for left sided CP and congestion starting this morning. Pt has extensive cardiac hx and has an ICD in place. EMS gave 2 nitro in route and 324 of aspirin.

## 2014-06-20 ENCOUNTER — Encounter: Payer: Self-pay | Admitting: Internal Medicine

## 2014-06-20 ENCOUNTER — Ambulatory Visit (INDEPENDENT_AMBULATORY_CARE_PROVIDER_SITE_OTHER): Payer: Medicare Other | Admitting: Internal Medicine

## 2014-06-20 VITALS — BP 120/82 | HR 67 | Ht 66.0 in | Wt 184.0 lb

## 2014-06-20 DIAGNOSIS — I255 Ischemic cardiomyopathy: Secondary | ICD-10-CM | POA: Diagnosis not present

## 2014-06-20 DIAGNOSIS — N183 Chronic kidney disease, stage 3 unspecified: Secondary | ICD-10-CM

## 2014-06-20 DIAGNOSIS — I5022 Chronic systolic (congestive) heart failure: Secondary | ICD-10-CM | POA: Diagnosis not present

## 2014-06-20 DIAGNOSIS — I10 Essential (primary) hypertension: Secondary | ICD-10-CM

## 2014-06-20 DIAGNOSIS — I472 Ventricular tachycardia, unspecified: Secondary | ICD-10-CM

## 2014-06-20 LAB — MDC_IDC_ENUM_SESS_TYPE_INCLINIC
Brady Statistic AS VP Percent: 0 %
Brady Statistic RA Percent Paced: 95.09 %
Brady Statistic RV Percent Paced: 0.1 %
HIGH POWER IMPEDANCE MEASURED VALUE: 304 Ohm
HIGH POWER IMPEDANCE MEASURED VALUE: 62 Ohm
HighPow Impedance: 171 Ohm
Lead Channel Impedance Value: 399 Ohm
Lead Channel Pacing Threshold Amplitude: 0.75 V
Lead Channel Pacing Threshold Pulse Width: 0.4 ms
Lead Channel Pacing Threshold Pulse Width: 0.4 ms
Lead Channel Sensing Intrinsic Amplitude: 3.5 mV
Lead Channel Sensing Intrinsic Amplitude: 8.125 mV
Lead Channel Setting Pacing Amplitude: 2 V
Lead Channel Setting Pacing Amplitude: 2.5 V
Lead Channel Setting Sensing Sensitivity: 0.3 mV
MDC IDC MSMT BATTERY VOLTAGE: 3.01 V
MDC IDC MSMT LEADCHNL RA IMPEDANCE VALUE: 475 Ohm
MDC IDC MSMT LEADCHNL RV PACING THRESHOLD AMPLITUDE: 0.75 V
MDC IDC SESS DTM: 20160328111236
MDC IDC SET LEADCHNL RV PACING PULSEWIDTH: 0.4 ms
MDC IDC SET ZONE DETECTION INTERVAL: 350 ms
MDC IDC SET ZONE DETECTION INTERVAL: 470 ms
MDC IDC STAT BRADY AP VP PERCENT: 0.1 %
MDC IDC STAT BRADY AP VS PERCENT: 94.98 %
MDC IDC STAT BRADY AS VS PERCENT: 4.91 %
Zone Setting Detection Interval: 270 ms
Zone Setting Detection Interval: 280 ms
Zone Setting Detection Interval: 440 ms

## 2014-06-20 LAB — COMPREHENSIVE METABOLIC PANEL
ALT: 32 U/L (ref 0–35)
AST: 35 U/L (ref 0–37)
Albumin: 3.8 g/dL (ref 3.5–5.2)
Alkaline Phosphatase: 48 U/L (ref 39–117)
BILIRUBIN TOTAL: 0.6 mg/dL (ref 0.2–1.2)
BUN: 40 mg/dL — AB (ref 6–23)
CO2: 28 meq/L (ref 19–32)
Calcium: 9.9 mg/dL (ref 8.4–10.5)
Chloride: 102 mEq/L (ref 96–112)
Creatinine, Ser: 2.61 mg/dL — ABNORMAL HIGH (ref 0.40–1.20)
GFR: 23.83 mL/min — AB (ref >60.00)
Glucose, Bld: 88 mg/dL (ref 70–99)
Potassium: 4.4 mEq/L (ref 3.5–5.1)
Sodium: 134 mEq/L — ABNORMAL LOW (ref 135–145)
Total Protein: 7.4 g/dL (ref 6.0–8.3)

## 2014-06-20 MED ORDER — ATORVASTATIN CALCIUM 40 MG PO TABS: 40.0000 mg | ORAL_TABLET | Freq: Every day | ORAL | Status: AC

## 2014-06-20 NOTE — Patient Instructions (Addendum)
Your physician has recommended you make the following change in your medication:   1. Change your Amiodirone 200 mg by mouth to taking at 2 PM instead of in the AM  2. Hold Atrovastatin for 3 days and Decrease to 40 mg by mouth daily  Your physician recommends that you have labs today CMET  Your physician recommends that you schedule a follow-up appointment in: 2 weeks with Chanetta Marshall NP.  Your physician recommends that you schedule a follow-up appointment in: 6 weeks with Dr. Rayann Heman.   Remote monitoring is used to monitor your Pacemaker or ICD from home. This monitoring reduces the number of office visits required to check your device to one time per year. It allows [place] to keep an eye on the functioning of your device to ensure it is working properly. You are scheduled for a device check from home on 09/19/2014. You may send your transmission at any time that day. If you have a wireless device, the transmission will be sent automatically. After your physician reviews your transmission, you will receive a postcard with your next transmission date.

## 2014-06-21 NOTE — Progress Notes (Signed)
Electrophysiology Office Note   Date:  06/21/2014   ID:  STEPHENI Terry, DOB 01-16-1951, MRN [id number]  PCP:  Bronson Curb, PA-C  Cardiologist:  Dr Johnsie Cancel Primary Electrophysiologist: Thompson Grayer, MD    Chief Complaint  Patient presents with  . Follow-up    Vtach     History of Present Illness: [name] Terry is a 64 y.o. female who presents today for electrophysiology evaluation.  She presents today for follow-up after her recent hospitalization for VT.  She seems to be doing well.  She does have anxiety related to ICD shocks.  She reports some nausea with amiodarone.  She also has muscle aches which occur several hours after taking this medicine.  Today, she denies symptoms of palpitations, chest pain, shortness of breath, orthopnea, PND, lower extremity edema, claudication, dizziness, presyncope, syncope, bleeding, or neurologic sequela. The patient is tolerating medications without difficulties and is otherwise without complaint today.    Past Medical History  Diagnosis Date  . Ischemic cardiomyopathy     a. s/p MDT dual chamber ICD implanted 2013 by Dr Westley Gambles at [facility], now followed by Dr Rayann Heman  . History of stroke      a. 1993  . Coronary artery disease     a. s/p previous interventions in [place] per patient, no records available b. cath 04/2013 with non-obstructive disease   . Lymphoma     a. s/p chemo/radiation 2009  . CHF (congestive heart failure)   . HTN (hypertension)   . HLD (hyperlipidemia)   . Ventricular tachycardia     a. CL 300 msec requiring ICD shocks therpay 5/14 b. recurrent VT 04/2014, placed on amiodarone c. recurrent VT 05/2014 s/p ablation by Dr Rayann Heman  . DM2 (diabetes mellitus, type 2), newly diagnosed    . CKD (chronic kidney disease) stage 3, GFR 30-59 ml/min    . Polymyalgia rheumatica   . Depression   . GERD (gastroesophageal reflux disease)    Past Surgical History  Procedure Laterality Date  . Cardiac defibrillator placement   03/2011    MDT ICD implanted at [facility] by Dr Tana Coast  . Coronary angioplasty with stent placement    . Left and right heart catheterization with coronary angiogram N/A 05/03/2013    non-obstructive CAD  . V-tach ablation N/A 05/26/2014    VT ablation by Dr Rayann Heman - PVCs arising from the inferolateral LV, extensive substrate ablation     Current Outpatient Prescriptions  Medication Sig Dispense Refill  . amiodarone (PACERONE) 200 MG tablet Take 1 tablet twice a day for 2 weeks. Then take 1 tablet daily    . amLODipine (NORVASC) 10 MG tablet Take 10 mg by mouth daily.    Marland Kitchen aspirin EC 81 MG tablet Take 81 mg by mouth daily.    Marland Kitchen atorvastatin (LIPITOR) 40 MG tablet Take 1 tablet (40 mg total) by mouth daily at 6 PM. 90 tablet 30  . betamethasone dipropionate (DIPROLENE) 0.05 % ointment Apply 1 application topically 3 (three) times daily as needed (dermatitis). Applies to forehead and scalp    . clopidogrel (PLAVIX) 75 MG tablet Take 75 mg by mouth at bedtime.     Marland Kitchen ezetimibe (ZETIA) 10 MG tablet Take 10 mg by mouth at bedtime.     . isosorbide mononitrate (IMDUR) 30 MG 24 hr tablet Take 30 mg by mouth daily.    Marland Kitchen lisinopril (PRINIVIL,ZESTRIL) 20 MG tablet Take 20 mg by mouth daily.    . metoprolol succinate (TOPROL-XL)  50 MG 24 hr tablet Take 1 tablet (50 mg total) by mouth 2 (two) times daily. Take with or immediately following a meal. 180 tablet 0  . Multiple Vitamin (MULTIVITAMIN WITH MINERALS) TABS Take 1 tablet by mouth at bedtime.     . pantoprazole (PROTONIX) 40 MG tablet Take 40 mg by mouth daily.    . potassium chloride (K-DUR,KLOR-CON) 10 MEQ tablet Take 10 mEq by mouth daily.    . promethazine (PHENERGAN) 25 MG tablet Take 1 tablet (25 mg total) by mouth every 6 (six) hours as needed for nausea or vomiting. 30 tablet 0  . spironolactone (ALDACTONE) 25 MG tablet Take 25 mg by mouth daily.      No current facility-administered medications for this visit.    Allergies:   Flagyl; Contrast  media; Ioxaglate; Potassium sulfate; and Potassium-containing compounds   Social History:  The patient  reports that she quit smoking about 4 weeks ago. She has never used smokeless tobacco. She reports that she does not drink alcohol or use illicit drugs.   Family History:  The patient's family history includes Alcoholism in her father and sister; Breast cancer in her mother; Diabetes in her brother, brother, brother, brother, and father; Heart attack in her brother and sister; Heart disease in her father, mother, and sister; Hypertension in her father, mother, and sister; Stroke in her brother and mother; Thyroid disease in her sister.    ROS:  Please see the history of present illness.   All other systems are reviewed and negative.    PHYSICAL EXAM: VS:  BP 120/82 mmHg  Pulse 67  Ht 5\' 6"  (1.676 m)  Wt 184 lb (83.462 kg)  BMI 29.71 kg/m2 , BMI Body mass index is 29.71 kg/(m^2). GEN: Well nourished, well developed, in no acute distress HEENT: normal Neck: no JVD, carotid bruits, or masses Cardiac: RRR; no murmurs, rubs, or gallops,no edema  Respiratory:  clear to auscultation bilaterally, normal work of breathing GI: soft, nontender, nondistended, + BS MS: no deformity or atrophy Skin: warm and dry, device pocket is well healed Neuro:  Strength and sensation are intact Psych: euthymic mood, full affect  EKG:  EKG is ordered today. The ekg ordered today shows atrial paced rhythm, QTc 433, LVH  Device interrogation is reviewed today in detail.  See PaceArt for details.   Recent Labs: 05/19/2014: Magnesium 2.5; TSH 12.116* 06/15/2014: Hemoglobin 11.9*; Platelets 197 06/20/2014: ALT 32; BUN 40*; Creatinine 2.61*; Potassium 4.4; Sodium 134*    Lipid Panel     Component Value Date/Time   CHOL 123 05/20/2014 0319   TRIG 50 05/20/2014 0319   HDL 44 05/20/2014 0319   CHOLHDL 2.8 05/20/2014 0319   VLDL 10 05/20/2014 0319   LDLCALC 69 05/20/2014 0319     Wt Readings from Last 3  Encounters:  06/20/14 184 lb (83.462 kg)  06/06/14 182 lb 12.8 oz (82.918 kg)  06/02/14 179 lb (81.194 kg)      ASSESSMENT AND PLAN:  1.  VT Better controlled  Normal ICD function See Pace Art report No changes today Interrogation reveals VT on 06/10/14 with CL 440 msec lasting 1 min 6 seconds occuring in the monitor zone Continue amiodarone 200mg  daily.  I have encouraged her to try taking it at night due to nausea May need to reduce to 100mg  daily if symptoms persists.  Could add sotalol to amiodarone if needed. Hopefully, her arrhythmias will continue to improve given recent VT ablation. No driving (pt is  aware) Check LFTs on amiodarone  2. Chronic systolic dysfunction Stable No change required today  3. HTN Stable No change required today  4. CRI Repeat bmet today May need renal consultation if not improved Oral hydration is encouraged  5. HL Given muscle aches, I have instructed her to hold atorvastatin x 3 days and then reduce to $RemoveB'40mg'[id number]$  daily She should follow-up with PCP if symptoms do not improve  Current medicines are reviewed at length with the patient today.   The patient does not have concerns regarding her medicines.  The following changes were made today:  none  Labs/ tests ordered today include:  Orders Placed This Encounter  Procedures  . Comp Met (CMET)  . Implantable device check  . EKG 12-Lead    Follow-up with Chanetta Marshall NP in 2 weeks I will see in 6 weeks  Signed, Thompson Grayer, MD  06/21/2014 9:42 PM     [place] [address] [place] [address] [phone] (office) [phone] (fax)

## 2014-06-27 ENCOUNTER — Telehealth: Payer: Self-pay | Admitting: Internal Medicine

## 2014-06-27 NOTE — Telephone Encounter (Signed)
Went to the hospital in [place] on Friday.   She says she was at the grocery store and passed.  She was hurting all over her body and felt bad.  Then all of a sudden she has a "rush" in her head and passed out.  She is going to start taking her Amiodarone with her dinner after she eats as she is experiencing nausea after taking the medication.  She is going to keep her appointment on 4/11 and call back if needs to be moved up

## 2014-06-27 NOTE — Telephone Encounter (Signed)
New Message  Pt calling to speak w/ Rn. Pt was recently d/c from [facility] and is due to see Amber on 4/11. Pt is aware of appt, but wants to speak w/ Rn about what to do until then. Please call back and discuss.

## 2014-06-29 ENCOUNTER — Emergency Department (HOSPITAL_COMMUNITY): Payer: Medicare Other

## 2014-06-29 ENCOUNTER — Encounter (HOSPITAL_COMMUNITY): Payer: Self-pay | Admitting: *Deleted

## 2014-06-29 ENCOUNTER — Emergency Department (HOSPITAL_COMMUNITY)
Admission: EM | Admit: 2014-06-29 | Discharge: 2014-06-30 | Disposition: A | Discharge status: Home / Self Care | Payer: Medicare Other | Attending: Emergency Medicine | Admitting: Emergency Medicine

## 2014-06-29 DIAGNOSIS — N183 Chronic kidney disease, stage 3 (moderate): Secondary | ICD-10-CM | POA: Diagnosis not present

## 2014-06-29 DIAGNOSIS — Z9861 Coronary angioplasty status: Secondary | ICD-10-CM | POA: Insufficient documentation

## 2014-06-29 DIAGNOSIS — Z8673 Personal history of transient ischemic attack (TIA), and cerebral infarction without residual deficits: Secondary | ICD-10-CM | POA: Insufficient documentation

## 2014-06-29 DIAGNOSIS — I509 Heart failure, unspecified: Secondary | ICD-10-CM | POA: Insufficient documentation

## 2014-06-29 DIAGNOSIS — R079 Chest pain, unspecified: Secondary | ICD-10-CM | POA: Diagnosis not present

## 2014-06-29 DIAGNOSIS — Z8659 Personal history of other mental and behavioral disorders: Secondary | ICD-10-CM | POA: Diagnosis not present

## 2014-06-29 DIAGNOSIS — Z9889 Other specified postprocedural states: Secondary | ICD-10-CM | POA: Insufficient documentation

## 2014-06-29 DIAGNOSIS — Z8579 Personal history of other malignant neoplasms of lymphoid, hematopoietic and related tissues: Secondary | ICD-10-CM | POA: Diagnosis not present

## 2014-06-29 DIAGNOSIS — I2510 Atherosclerotic heart disease of native coronary artery without angina pectoris: Secondary | ICD-10-CM | POA: Diagnosis not present

## 2014-06-29 DIAGNOSIS — Z8719 Personal history of other diseases of the digestive system: Secondary | ICD-10-CM | POA: Diagnosis not present

## 2014-06-29 DIAGNOSIS — Z87891 Personal history of nicotine dependence: Secondary | ICD-10-CM | POA: Diagnosis not present

## 2014-06-29 DIAGNOSIS — Z79899 Other long term (current) drug therapy: Secondary | ICD-10-CM | POA: Insufficient documentation

## 2014-06-29 DIAGNOSIS — I129 Hypertensive chronic kidney disease with stage 1 through stage 4 chronic kidney disease, or unspecified chronic kidney disease: Secondary | ICD-10-CM | POA: Insufficient documentation

## 2014-06-29 DIAGNOSIS — Z7902 Long term (current) use of antithrombotics/antiplatelets: Secondary | ICD-10-CM | POA: Diagnosis not present

## 2014-06-29 DIAGNOSIS — Z7982 Long term (current) use of aspirin: Secondary | ICD-10-CM | POA: Diagnosis not present

## 2014-06-29 DIAGNOSIS — R109 Unspecified abdominal pain: Secondary | ICD-10-CM | POA: Diagnosis not present

## 2014-06-29 DIAGNOSIS — Z95810 Presence of automatic (implantable) cardiac defibrillator: Secondary | ICD-10-CM | POA: Diagnosis not present

## 2014-06-29 DIAGNOSIS — E785 Hyperlipidemia, unspecified: Secondary | ICD-10-CM | POA: Diagnosis not present

## 2014-06-29 DIAGNOSIS — Z8739 Personal history of other diseases of the musculoskeletal system and connective tissue: Secondary | ICD-10-CM | POA: Diagnosis not present

## 2014-06-29 DIAGNOSIS — E119 Type 2 diabetes mellitus without complications: Secondary | ICD-10-CM | POA: Diagnosis not present

## 2014-06-29 LAB — URINALYSIS, ROUTINE W REFLEX MICROSCOPIC
Bilirubin Urine: NEGATIVE
GLUCOSE, UA: NEGATIVE mg/dL
HGB URINE DIPSTICK: NEGATIVE
Ketones, ur: NEGATIVE mg/dL
LEUKOCYTES UA: NEGATIVE
Nitrite: NEGATIVE
PH: 7 (ref 5.0–8.0)
PROTEIN: NEGATIVE mg/dL
Specific Gravity, Urine: 1.007 (ref 1.005–1.030)
Urobilinogen, UA: 0.2 mg/dL (ref 0.0–1.0)

## 2014-06-29 LAB — CBC WITH DIFFERENTIAL/PLATELET
BASOS ABS: 0 10*3/uL (ref 0.0–0.1)
Basophils Relative: 1 % (ref 0–1)
Eosinophils Absolute: 0.3 10*3/uL (ref 0.0–0.7)
Eosinophils Relative: 6 % — ABNORMAL HIGH (ref 0–5)
HCT: 36.9 % (ref 36.0–46.0)
Hemoglobin: 12.4 g/dL (ref 12.0–15.0)
LYMPHS ABS: 2 10*3/uL (ref 0.7–4.0)
Lymphocytes Relative: 34 % (ref 12–46)
MCH: 29.8 pg (ref 26.0–34.0)
MCHC: 33.6 g/dL (ref 30.0–36.0)
MCV: 88.7 fL (ref 78.0–100.0)
Monocytes Absolute: 0.5 10*3/uL (ref 0.1–1.0)
Monocytes Relative: 9 % (ref 3–12)
NEUTROS ABS: 3 10*3/uL (ref 1.7–7.7)
NEUTROS PCT: 50 % (ref 43–77)
PLATELETS: 180 10*3/uL (ref 150–400)
RBC: 4.16 MIL/uL (ref 3.87–5.11)
RDW: 16.1 % — AB (ref 11.5–15.5)
WBC: 5.8 10*3/uL (ref 4.0–10.5)

## 2014-06-29 LAB — TROPONIN I

## 2014-06-29 LAB — COMPREHENSIVE METABOLIC PANEL
ALT: 93 U/L — AB (ref 0–35)
AST: 88 U/L — ABNORMAL HIGH (ref 0–37)
Albumin: 3.6 g/dL (ref 3.5–5.2)
Alkaline Phosphatase: 54 U/L (ref 39–117)
Anion gap: 8 (ref 5–15)
BUN: 25 mg/dL — AB (ref 6–23)
CALCIUM: 9.2 mg/dL (ref 8.4–10.5)
CO2: 23 mmol/L (ref 19–32)
Chloride: 107 mmol/L (ref 96–112)
Creatinine, Ser: 1.96 mg/dL — ABNORMAL HIGH (ref 0.50–1.10)
GFR calc Af Amer: 30 mL/min — ABNORMAL LOW (ref >90)
GFR, EST NON AFRICAN AMERICAN: 26 mL/min — AB (ref >90)
Glucose, Bld: 119 mg/dL — ABNORMAL HIGH (ref 70–99)
POTASSIUM: 4.2 mmol/L (ref 3.5–5.1)
Sodium: 138 mmol/L (ref 135–145)
Total Bilirubin: 0.5 mg/dL (ref 0.3–1.2)
Total Protein: 6.5 g/dL (ref 6.0–8.3)

## 2014-06-29 LAB — I-STAT TROPONIN, ED: Troponin i, poc: 0 ng/mL (ref 0.00–0.08)

## 2014-06-29 LAB — PROTIME-INR
INR: 1.04 (ref 0.00–1.49)
PROTHROMBIN TIME: 13.7 s (ref 11.6–15.2)

## 2014-06-29 NOTE — ED Notes (Signed)
Dr. Rees at bedside  

## 2014-06-29 NOTE — ED Provider Notes (Signed)
CSN: [id number]     Arrival date & time 06/29/14  1839 History   First MD Initiated Contact with Patient 06/29/14 1841     Chief Complaint  Patient presents with  . Flank Pain    Patient is a 64 y.o. female presenting with flank pain. The history is provided by the patient. No language interpreter was used.  Flank Pain   [name] Terry presents for evaluation of left side pain. The pain is located beneath beneath her left breast. It woke her from sleep shortly prior to ED arrival. She said it felt like a sharp jab/zap type feeling.  A minute later she felt another episode. Following that she's had constant dull left-sided pain. She denies any fevers, vomiting, abdominal pain, dysuria, leg swelling. She does report a cough, which is an ongoing problem for her, productive of white sputum. She denies any hemoptysis. She is concerned that her defibrillator fired but this feels different than the last episode of the defibrillator firing. Symptoms are moderate, constant.  Past Medical History  Diagnosis Date  . Ischemic cardiomyopathy     a. s/p MDT dual chamber ICD implanted 2013 by Dr Westley Gambles at [facility], now followed by Dr Rayann Heman  . History of stroke      a. 1993  . Coronary artery disease     a. s/p previous interventions in [place] per patient, no records available b. cath 04/2013 with non-obstructive disease   . Lymphoma     a. s/p chemo/radiation 2009  . CHF (congestive heart failure)   . HTN (hypertension)   . HLD (hyperlipidemia)   . Ventricular tachycardia     a. CL 300 msec requiring ICD shocks therpay 5/14 b. recurrent VT 04/2014, placed on amiodarone c. recurrent VT 05/2014 s/p ablation by Dr Rayann Heman  . DM2 (diabetes mellitus, type 2), newly diagnosed    . CKD (chronic kidney disease) stage 3, GFR 30-59 ml/min    . Polymyalgia rheumatica   . Depression   . GERD (gastroesophageal reflux disease)    Past Surgical History  Procedure Laterality Date  . Cardiac defibrillator placement   03/2011    MDT ICD implanted at [facility] by Dr Tana Coast  . Coronary angioplasty with stent placement    . Left and right heart catheterization with coronary angiogram N/A 05/03/2013    non-obstructive CAD  . V-tach ablation N/A 05/26/2014    VT ablation by Dr Rayann Heman - PVCs arising from the inferolateral LV, extensive substrate ablation   Family History  Problem Relation Age of Onset  . Diabetes Father   . Hypertension Father   . Heart disease Father   . Alcoholism Father   . Hypertension Mother   . Heart disease Mother   . Breast cancer Mother   . Stroke Mother   . Diabetes Brother   . Diabetes Brother   . Diabetes Brother   . Diabetes Brother   . Hypertension Sister   . Heart disease Sister   . Alcoholism Sister   . Thyroid disease Sister   . Heart attack Sister   . Heart attack Brother   . Stroke Brother    History  Substance Use Topics  . Smoking status: Former Smoker -- 0.25 packs/day    Quit date: 05/20/2014  . Smokeless tobacco: Never Used     Comment: she is not ready to quit  . Alcohol Use: No   OB History    No data available     Review of Systems  Genitourinary: Positive for flank pain.  All other systems reviewed and are negative.     Allergies  Flagyl; Contrast media; Ioxaglate; Potassium sulfate; and Potassium-containing compounds  Home Medications   Prior to Admission medications   Medication Sig Start Date End Date Taking? Authorizing Provider  amiodarone (PACERONE) 200 MG tablet Take 1 tablet twice a day for 2 weeks. Then take 1 tablet daily Patient taking differently: Take 200 mg by mouth daily. Take 1 tablet twice a day for 2 weeks. Then take 1 tablet daily 06/06/14  Yes Chanetta Marshall, NP  amLODipine (NORVASC) 10 MG tablet Take 10 mg by mouth daily.   Yes Historical Provider, MD  aspirin EC 81 MG tablet Take 81 mg by mouth daily.   Yes Historical Provider, MD  atorvastatin (LIPITOR) 40 MG tablet Take 1 tablet (40 mg total) by mouth daily at 6  PM. Patient taking differently: Take 40 mg by mouth at bedtime.  06/20/14  Yes Thompson Grayer, MD  clopidogrel (PLAVIX) 75 MG tablet Take 75 mg by mouth at bedtime.    Yes Historical Provider, MD  ezetimibe (ZETIA) 10 MG tablet Take 10 mg by mouth at bedtime.    Yes Historical Provider, MD  isosorbide mononitrate (IMDUR) 30 MG 24 hr tablet Take 30 mg by mouth daily.   Yes Historical Provider, MD  lisinopril (PRINIVIL,ZESTRIL) 20 MG tablet Take 20 mg by mouth daily.   Yes Historical Provider, MD  metoprolol succinate (TOPROL-XL) 50 MG 24 hr tablet Take 1 tablet (50 mg total) by mouth 2 (two) times daily. Take with or immediately following a meal. 05/27/14  Yes Brett Canales, PA-C  Multiple Vitamin (MULTIVITAMIN WITH MINERALS) TABS Take 1 tablet by mouth at bedtime.    Yes Historical Provider, MD  potassium chloride (K-DUR,KLOR-CON) 10 MEQ tablet Take 10 mEq by mouth at bedtime.    Yes Historical Provider, MD  promethazine (PHENERGAN) 25 MG tablet Take 1 tablet (25 mg total) by mouth every 6 (six) hours as needed for nausea or vomiting. 06/02/14  Yes Linton Flemings, MD   BP 119/70 mmHg  Pulse 57  Temp(Src) 98.3 F (36.8 C) (Oral)  Resp 15  SpO2 100% Physical Exam  Constitutional: She is oriented to person, place, and time. She appears well-developed and well-nourished.  HENT:  Head: Normocephalic and atraumatic.  Cardiovascular: Normal rate and regular rhythm.   No murmur heard. Pulmonary/Chest: Effort normal and breath sounds normal. No respiratory distress. She exhibits tenderness.  Abdominal: Soft. There is no tenderness. There is no rebound and no guarding.  Musculoskeletal: She exhibits no edema or tenderness.  Neurological: She is alert and oriented to person, place, and time.  Skin: Skin is warm and dry.  Psychiatric:  anxious  Nursing note and vitals reviewed.   ED Course  Procedures (including critical care time) Labs Review Labs Reviewed  COMPREHENSIVE METABOLIC PANEL - Abnormal;  Notable for the following:    Glucose, Bld 119 (*)    BUN 25 (*)    Creatinine, Ser 1.96 (*)    AST 88 (*)    ALT 93 (*)    GFR calc non Af Amer 26 (*)    GFR calc Af Amer 30 (*)    All other components within normal limits  CBC WITH DIFFERENTIAL/PLATELET - Abnormal; Notable for the following:    RDW 16.1 (*)    Eosinophils Relative 6 (*)    All other components within normal limits  TROPONIN I  URINALYSIS, ROUTINE W  REFLEX MICROSCOPIC  PROTIME-INR  Randolm Idol, ED    Imaging Review Dg Chest 2 View  06/29/2014   CLINICAL DATA:  Initial encounter for chest pain and shortness of breath since 05/27/2014.  EXAM: CHEST  2 VIEW  COMPARISON:  06/02/2014.  FINDINGS: The lungs are clear without focal infiltrate, edema, pneumothorax or pleural effusion. The cardiopericardial silhouette is within normal limits for size. Imaged bony structures of the thorax are intact. Left-sided pacer/ AICD is Stable.  IMPRESSION: No active cardiopulmonary disease.   Electronically Signed   By: Misty Stanley M.D.   On: 06/29/2014 20:49     EKG Interpretation   Date/Time:  Wednesday June 29 2014 18:48:16 EDT Ventricular Rate:  59 PR Interval:  233 QRS Duration: 105 QT Interval:  412 QTC Calculation: 408 R Axis:   -24 Text Interpretation:  Sinus rhythm Ventricular trigeminy Prolonged PR  interval Abnormal R-wave progression, early transition Probable left  ventricular hypertrophy Nonspecific T abnormalities, diffuse leads  Confirmed by Hazle Coca [phone]) on 06/29/2014 7:04:00 PM      MDM   Final diagnoses:  Chest pain, unspecified chest pain type   Ms. Mccay presents for evaluation of left-sided chest pain. She has a history of cardiomyopathy status post his ICD placement. She is concerned that her pacer may have fired. Pacer defibrillator was interrogated and demonstrated no episodes of shocks. She has had episodes of V. tach that she has been paced out of but none today. Patient was admitted to  [facility] several days ago for a syncopal event and she did have an episode of V. tach at that time that she was paced out of. BMP demonstrates stable renal insufficiency. History and presentation is not consistent with ACS, PE, dissection, renal colic, UTI. Offered patient reassurance and discussed close cardiology follow-up as well as return precautions. There is mild elevation in her transaminases without any right upper quadrant tenderness.   Quintella Reichert, MD 06/29/14 [phone]

## 2014-06-29 NOTE — Discharge Instructions (Signed)

## 2014-06-29 NOTE — ED Notes (Signed)
Per EMs- pt presents from home c/o left sided flank pain.  Pt describes as "shocking" and nonradiating.  Pt states she is in the process of being evaluated for stomach cancer.  Also hx of MI.  BP-117/83 P-79 A paced. R-22 O2-97% RA, CBG-146. Pt a x 4, NAD on arrival.

## 2014-06-30 ENCOUNTER — Telehealth: Payer: Self-pay | Admitting: Nurse Practitioner

## 2014-06-30 NOTE — Telephone Encounter (Signed)
Spoke with patient, she was seen in the ER last night with chest pain and discharged to home after evaluation. Device interrogation demonstrated recurrent VT terminated with ATP. She had a syncopal episode several days ago in the setting of VT that was pace terminated.  She thinks that her symptoms are improved post ablation but are again increasing in frequency. She is unable to tolerate higher doses of amiodarone 2/2 nausea. Her BP will not allow up-titration of Metoprolol.  She has an appt to see me on Monday which she will keep. She is aware not to drive.  She will call back between now and then with concerns.  Chanetta Marshall, NP 06/30/2014 11:11 AM

## 2014-07-01 ENCOUNTER — Telehealth: Payer: Self-pay | Admitting: Physician Assistant

## 2014-07-01 NOTE — Telephone Encounter (Signed)
The patient called to report her stomach, heart and left arm  Hurt-4-5/10.  She said her stomach was "rolling".  She did recently eat.  These symptoms she has been having repeatedly.  BP 142/78.  +periodic mild SOB, no dizziness/presyncope.  No ICD discharge.  I recommended taking another dose of Imdur 30mg  and tums.  If symptoms do not improve, go to ER.   [name] Terry, PAC

## 2014-07-02 NOTE — Progress Notes (Signed)
Electrophysiology Office Note Date: 07/04/2014  ID:  [name] Terry, DOB October 04, 1950, MRN [id number]  PCP: [name] Curb, PA-C Primary Cardiologist: Johnsie Cancel Electrophysiologist: [name] Terry  CC: VT follow up  [name] Terry is a 64 y.o. female seen today for Dr. Rayann Terry.  She was recently seen by Dr [name] Terry and was having nausea and muscle aches that she related to amiodarone.  Timing of her doses was changed with improvement in symptoms.  She has also had recurrent VT associated with syncope for which she was seen at the ER in [place].  She presented to [facility] ER 06/29/14 with chest pain and was discharged after evaluation. She states that she has intermittent chest discomfort that is relieved by taking an extra Imdur.  She also has occasional symptomatic hypotension. She denies dyspnea, PND, orthopnea, syncope, edema, weight gain, or early satiety.   Device History: MDT dual chamber ICD implanted 2013 for ischemic cardiomyopathy by Dr Westley Gambles at [facility] History of appropriate therapy: yes History of AAD therapy: yes, amiodarone initiated 04/2014 for VT, s/p ablation 05/2014 Dr [name] Terry   Past Medical History  Diagnosis Date  . Ischemic cardiomyopathy     a. s/p MDT dual chamber ICD implanted 2013 by Dr Westley Gambles at [facility], now followed by Dr [name] Terry  . History of stroke      a. 1993  . Coronary artery disease     a. s/p previous interventions in [place] per patient, no records available b. cath 04/2013 with non-obstructive disease   . Lymphoma     a. s/p chemo/radiation 2009  . CHF (congestive heart failure)   . HTN (hypertension)   . HLD (hyperlipidemia)   . Ventricular tachycardia     a. CL 300 msec requiring ICD shocks therpay 5/14 b. recurrent VT 04/2014, placed on amiodarone c. recurrent VT 05/2014 s/p ablation by Dr [name] Terry  . DM2 (diabetes mellitus, type 2), newly diagnosed    . CKD (chronic kidney disease) stage 3, GFR 30-59 ml/min    . Polymyalgia rheumatica   . Depression   . GERD  (gastroesophageal reflux disease)    Past Surgical History  Procedure Laterality Date  . Cardiac defibrillator placement  03/2011    MDT ICD implanted at [facility] by Dr Tana Coast  . Coronary angioplasty with stent placement    . Left and right heart catheterization with coronary angiogram N/A 05/03/2013    non-obstructive CAD  . V-tach ablation N/A 05/26/2014    VT ablation by Dr [name] Terry - PVCs arising from the inferolateral LV, extensive substrate ablation    Current Outpatient Prescriptions  Medication Sig Dispense Refill  . amiodarone (PACERONE) 200 MG tablet Take 1 tablet twice a day for 2 weeks. Then take 1 tablet daily (Patient taking differently: Take 200 mg by mouth daily. Take 1 tablet twice a day for 2 weeks. Then take 1 tablet daily)    . amLODipine (NORVASC) 10 MG tablet Take 10 mg by mouth daily.    Marland Kitchen aspirin EC 81 MG tablet Take 81 mg by mouth daily.    Marland Kitchen atorvastatin (LIPITOR) 40 MG tablet Take 1 tablet (40 mg total) by mouth daily at 6 PM. (Patient taking differently: Take 40 mg by mouth at bedtime. ) 90 tablet 30  . clopidogrel (PLAVIX) 75 MG tablet Take 75 mg by mouth at bedtime.     Marland Kitchen ezetimibe (ZETIA) 10 MG tablet Take 10 mg by mouth at bedtime.     Marland Kitchen glipiZIDE (GLUCOTROL XL) 2.5 MG 24  hr tablet Take 2.5 mg by mouth daily with breakfast.    . isosorbide mononitrate (IMDUR) 30 MG 24 hr tablet Take 30 mg by mouth daily.    . metoprolol succinate (TOPROL-XL) 50 MG 24 hr tablet Take 1 tablet (50 mg total) by mouth 2 (two) times daily. Take with or immediately following a meal. 180 tablet 0  . Multiple Vitamin (MULTIVITAMIN WITH MINERALS) TABS Take 1 tablet by mouth at bedtime.     . potassium chloride (K-DUR,KLOR-CON) 10 MEQ tablet Take 10 mEq by mouth at bedtime.      No current facility-administered medications for this visit.    Allergies:   Flagyl; Contrast media; Ioxaglate; Potassium sulfate; and Potassium-containing compounds   Social History: History   Social History  .  Marital Status: Single    Spouse Name: N/A  . Number of Children: 2  . Years of Education: N/A   Occupational History  . Not on file.   Social History Main Topics  . Smoking status: Former Smoker -- 0.25 packs/day    Quit date: 05/20/2014  . Smokeless tobacco: Never Used     Comment: she is not ready to quit  . Alcohol Use: No  . Drug Use: No  . Sexual Activity: Not on file   Other Topics Concern  . Not on file   Social History Narrative    Family History: Family History  Problem Relation Age of Onset  . Diabetes Father   . Hypertension Father   . Heart disease Father   . Alcoholism Father   . Hypertension Mother   . Heart disease Mother   . Breast cancer Mother   . Stroke Mother   . Diabetes Brother   . Diabetes Brother   . Diabetes Brother   . Diabetes Brother   . Hypertension Sister   . Heart disease Sister   . Alcoholism Sister   . Thyroid disease Sister   . Heart attack Sister   . Heart attack Brother   . Stroke Brother     Review of Systems: All other systems reviewed and are otherwise negative except as noted above.   Physical Exam: VS:  BP 110/80 mmHg  Pulse 77  Ht 5\' 6"  (1.676 m)  Wt 185 lb 9.6 oz (84.188 kg)  BMI 29.97 kg/m2  SpO2 97% , BMI Body mass index is 29.97 kg/(m^2).  GEN- The patient is well appearing, alert and oriented x 3 today.   HEENT: normocephalic, atraumatic; sclera clear, conjunctiva pink; hearing intact; oropharynx clear; neck supple, no JVP Lymph- no cervical lymphadenopathy Lungs- Clear to ausculation bilaterally, normal work of breathing.  No wheezes, rales, rhonchi Heart- Regular rate and rhythm, 2/6 SEM, rubs or gallops  GI- soft, non-tender, non-distended, bowel sounds present  Extremities- no clubbing, cyanosis, or edema; DP/PT/radial pulses 1+ bilaterally MS- no significant deformity or atrophy Skin- warm and dry, no rash or lesion; ICD pocket well healed Psych- euthymic mood, full affect Neuro- strength and  sensation are intact  ICD interrogation- reviewed in detail today,  See PACEART report  Recent Labs: 05/19/2014: Magnesium 2.5; TSH 12.116* 06/29/2014: ALT 93*; BUN 25*; Creatinine 1.96*; Hemoglobin 12.4; Platelets 180; Potassium 4.2; Sodium 138   Wt Readings from Last 3 Encounters:  07/04/14 185 lb 9.6 oz (84.188 kg)  06/20/14 184 lb (83.462 kg)  06/06/14 182 lb 12.8 oz (82.918 kg)     Other studies Reviewed: Additional studies/ records that were reviewed today include: hospital records, ER records, EKG 06/02/14  Assessment and Plan:  1.  Ventricular tachycardia Device interrogation today demonstrates NSVT episodes, with a sustained episode just below detection zone at time of syncopal event.  The patient would like to avoid repeat ablation if able, discussed possibility with her today.  Continue amiodarone 200mg  daily ICD reprogrammed to lower VT detection to 128bpm today No driving X6 months from 05/2014 admission (pt aware)  2.  Chronic systolic dysfunction euvolemic today Stable on an appropriate medical regimen Normal ICD function See Pace Art report No changes today  3.  CAD/ICM Occasional chest pain relieved with extra Imdur.  PRN NTG called in today. Catheterization 04/2014 with moderate CAD but no targets for revascularization.  Continue medical therapy as above  4.  HTN Stable No change required today  5.  CKD Renal function improved at recent labs during ER visit Will recheck BMET in 2 weeks at follow up   Current medicines are reviewed at length with the patient today.   The patient does not have concerns regarding her medicines.  The following changes were made today:  SL NTG as needed for chest pain   Disposition:   Follow up with me in 2 weeks   Signed, Chanetta Marshall, NP 07/04/2014 11:10 AM  [facility] [place] [address] [phone] (office) [phone] (fax

## 2014-07-04 ENCOUNTER — Encounter: Payer: Self-pay | Admitting: Internal Medicine

## 2014-07-04 ENCOUNTER — Ambulatory Visit (INDEPENDENT_AMBULATORY_CARE_PROVIDER_SITE_OTHER): Payer: Medicare Other | Admitting: Nurse Practitioner

## 2014-07-04 ENCOUNTER — Encounter: Payer: Self-pay | Admitting: Nurse Practitioner

## 2014-07-04 ENCOUNTER — Other Ambulatory Visit: Payer: Self-pay | Admitting: Internal Medicine

## 2014-07-04 VITALS — BP 110/80 | HR 77 | Ht 66.0 in | Wt 185.6 lb

## 2014-07-04 DIAGNOSIS — I255 Ischemic cardiomyopathy: Secondary | ICD-10-CM | POA: Diagnosis not present

## 2014-07-04 DIAGNOSIS — I472 Ventricular tachycardia, unspecified: Secondary | ICD-10-CM

## 2014-07-04 MED ORDER — NITROGLYCERIN 0.4 MG SL SUBL: 0.4000 mg | SUBLINGUAL_TABLET | SUBLINGUAL | Status: AC | PRN

## 2014-07-04 NOTE — Patient Instructions (Signed)
Your physician recommends that you schedule a follow-up appointment in: 2 weeks with Chanetta Marshall, NP.  Your physician recommends that you continue on your current medications as directed. Please refer to the Current Medication list given to you today.

## 2014-07-17 NOTE — Progress Notes (Addendum)
Electrophysiology Office Note Date: 07/18/2014  ID:  [name] Terry, DOB 03-24-1951, MRN [id number]  PCP: Bronson Curb, PA-C Primary Cardiologist: Johnsie Cancel Electrophysiologist: Allred  CC: VT follow up  [name] Terry is a 64 y.o. female seen today for Dr. Rayann Heman.  She was admitted 04/2014 with ventricular tachycardia.  Catheterization demonstrated no targets for revascularization and she was placed on amiodarone. She had recurrent VT in 05/2014 and underwent VT ablation.  She has been maintained on amiodarone and has had recurrent VT associated with syncope and dizziness.  Since she was seen last 2 weeks ago, she reports ongoing symptoms.  Device interrogation demonstrates recurrent VT terminated with ATP and multiple non-sustained VT episodes.  She denies dyspnea, PND, orthopnea, syncope, edema, weight gain, or early satiety.  She has not had further ICD therapy.   Device History: MDT dual chamber ICD implanted 2013 for ischemic cardiomyopathy by Dr Westley Gambles at [facility] History of appropriate therapy: yes History of AAD therapy: yes, amiodarone initiated 04/2014 for VT, s/p ablation 05/2014 Dr Rayann Heman   Past Medical History  Diagnosis Date  . Ischemic cardiomyopathy     a. s/p MDT dual chamber ICD implanted 2013 by Dr Westley Gambles at [facility], now followed by Dr Rayann Heman  . History of stroke      a. 1993  . Coronary artery disease     a. s/p previous interventions in [place] per patient, no records available b. cath 04/2013 with non-obstructive disease   . Lymphoma     a. s/p chemo/radiation 2009  . CHF (congestive heart failure)   . HTN (hypertension)   . HLD (hyperlipidemia)   . Ventricular tachycardia     a. CL 300 msec requiring ICD shocks therpay 5/14 b. recurrent VT 04/2014, placed on amiodarone c. recurrent VT 05/2014 s/p ablation by Dr Rayann Heman  . DM2 (diabetes mellitus, type 2), newly diagnosed    . CKD (chronic kidney disease) stage 3, GFR 30-59 ml/min    . Polymyalgia rheumatica   .  Depression   . GERD (gastroesophageal reflux disease)    Past Surgical History  Procedure Laterality Date  . Cardiac defibrillator placement  03/2011    MDT ICD implanted at [facility] by Dr Tana Coast  . Coronary angioplasty with stent placement    . Left and right heart catheterization with coronary angiogram N/A 05/03/2013    non-obstructive CAD  . V-tach ablation N/A 05/26/2014    VT ablation by Dr Rayann Heman - PVCs arising from the inferolateral LV, extensive substrate ablation    Current Outpatient Prescriptions  Medication Sig Dispense Refill  . amiodarone (PACERONE) 200 MG tablet Take 1 tablet twice a day for 2 weeks. Then take 1 tablet daily (Patient taking differently: Take 200 mg by mouth daily. Take 1 tablet once a day for 2 weeks. Then take 1 tablet daily)    . amLODipine (NORVASC) 10 MG tablet Take 10 mg by mouth daily.    Marland Kitchen aspirin EC 81 MG tablet Take 81 mg by mouth daily.    Marland Kitchen atorvastatin (LIPITOR) 40 MG tablet Take 1 tablet (40 mg total) by mouth daily at 6 PM. (Patient taking differently: Take 40 mg by mouth at bedtime. ) 90 tablet 30  . clopidogrel (PLAVIX) 75 MG tablet Take 75 mg by mouth at bedtime.     Marland Kitchen ezetimibe (ZETIA) 10 MG tablet Take 10 mg by mouth at bedtime.     Marland Kitchen glipiZIDE (GLUCOTROL XL) 2.5 MG 24 hr tablet Take 2.5 mg  by mouth daily with breakfast.    . isosorbide mononitrate (IMDUR) 30 MG 24 hr tablet Take 30 mg by mouth daily.    . metoprolol succinate (TOPROL-XL) 50 MG 24 hr tablet Take 1 tablet (50 mg total) by mouth 2 (two) times daily. Take with or immediately following a meal. 180 tablet 0  . nitroGLYCERIN (NITROSTAT) 0.4 MG SL tablet Place 1 tablet (0.4 mg total) under the tongue every 5 (five) minutes as needed for chest pain. 25 tablet 3  . potassium chloride (K-DUR,KLOR-CON) 10 MEQ tablet Take 10 mEq by mouth at bedtime.      No current facility-administered medications for this visit.    Allergies:   Flagyl; Contrast media; Ioxaglate; Potassium sulfate; and  Potassium-containing compounds   Social History: History   Social History  . Marital Status: Single    Spouse Name: N/A  . Number of Children: 2  . Years of Education: N/A   Occupational History  . Not on file.   Social History Main Topics  . Smoking status: Former Smoker -- 0.25 packs/day    Quit date: 05/20/2014  . Smokeless tobacco: Never Used     Comment: she is not ready to quit  . Alcohol Use: No  . Drug Use: No  . Sexual Activity: Not on file   Other Topics Concern  . Not on file   Social History Narrative    Family History: Family History  Problem Relation Age of Onset  . Diabetes Father   . Hypertension Father   . Heart disease Father   . Alcoholism Father   . Hypertension Mother   . Heart disease Mother   . Breast cancer Mother   . Stroke Mother   . Diabetes Brother   . Diabetes Brother   . Diabetes Brother   . Diabetes Brother   . Hypertension Sister   . Heart disease Sister   . Alcoholism Sister   . Thyroid disease Sister   . Heart attack Sister   . Heart attack Brother   . Stroke Brother     Review of Systems: All other systems reviewed and are otherwise negative except as noted above.   Physical Exam: VS:  BP 138/80 mmHg  Pulse 61  Ht 5\' 4"  (1.626 m)  Wt 184 lb 12.8 oz (83.825 kg)  BMI 31.71 kg/m2 , BMI Body mass index is 31.71 kg/(m^2).  GEN- The patient is well appearing, alert and oriented x 3 today.   HEENT: normocephalic, atraumatic; sclera clear, conjunctiva pink; hearing intact; oropharynx clear; neck supple, no JVP Lymph- no cervical lymphadenopathy Lungs- Clear to ausculation bilaterally, normal work of breathing.  No wheezes, rales, rhonchi Heart- Regular rate and rhythm, 2/6 SEM, rubs or gallops  GI- soft, non-tender, non-distended, bowel sounds present  Extremities- no clubbing, cyanosis, or edema; DP/PT/radial pulses 1+ bilaterally MS- no significant deformity or atrophy Skin- warm and dry, no rash or lesion; ICD  pocket well healed Psych- euthymic mood, full affect Neuro- strength and sensation are intact  ICD interrogation- reviewed in detail today,  See PACEART report  Recent Labs: 05/19/2014: Magnesium 2.5; TSH 12.116* 06/29/2014: ALT 93*; BUN 25*; Creatinine 1.96*; Hemoglobin 12.4; Platelets 180; Potassium 4.2; Sodium 138   Wt Readings from Last 3 Encounters:  07/18/14 184 lb 12.8 oz (83.825 kg)  07/04/14 185 lb 9.6 oz (84.188 kg)  06/20/14 184 lb (83.462 kg)     Other studies Reviewed: Additional studies/ records that were reviewed today include: hospital records, ER  records, EKG 06/02/14   Assessment and Plan:  1.  Ventricular tachycardia Device interrogation today demonstrates increased NSVT episodes, with 2 episodes of VT terminated with ATP (cycle length 456msec).   Continue amiodarone 200mg  daily.  She is unable to tolerate higher doses.  Renal function makes adding sotalol less than ideal.  D/w Dr Rayann Heman, recommend repeat ablation with CARTO and anesthesia.  Risks, benefits of repeat ablation discussed with the patient who wishes to proceed.  No driving X6 months (pt aware)  2.  Chronic systolic dysfunction euvolemic today Stable on an appropriate medical regimen Normal ICD function See Pace Art report No changes today  3.  CAD/ICM Catheterization 04/2014 with moderate CAD but no targets for revascularization.  Continue medical therapy   4.  HTN Stable No change required today  5.  CKD Improved with last labs Continue to hold Spironolactone, lisinopril. Will recheck BMET with pre-procedure labs.    Current medicines are reviewed at length with the patient today.   The patient does not have concerns regarding her medicines.  The following changes were made today:  none   Disposition:   Follow up after ablation   Signed, Chanetta Marshall, NP 07/18/2014 10:20 AM  [facility] [place] [address] [phone]  (office) [phone] (fax)

## 2014-07-18 ENCOUNTER — Ambulatory Visit (INDEPENDENT_AMBULATORY_CARE_PROVIDER_SITE_OTHER): Payer: Medicare Other | Admitting: Nurse Practitioner

## 2014-07-18 ENCOUNTER — Encounter: Payer: Self-pay | Admitting: Nurse Practitioner

## 2014-07-18 ENCOUNTER — Other Ambulatory Visit: Payer: Self-pay | Admitting: Internal Medicine

## 2014-07-18 ENCOUNTER — Encounter: Payer: Self-pay | Admitting: Internal Medicine

## 2014-07-18 VITALS — BP 138/80 | HR 61 | Ht 64.0 in | Wt 184.8 lb

## 2014-07-18 DIAGNOSIS — I472 Ventricular tachycardia, unspecified: Secondary | ICD-10-CM

## 2014-07-18 DIAGNOSIS — I10 Essential (primary) hypertension: Secondary | ICD-10-CM

## 2014-07-18 DIAGNOSIS — Z95810 Presence of automatic (implantable) cardiac defibrillator: Secondary | ICD-10-CM | POA: Diagnosis not present

## 2014-07-18 DIAGNOSIS — I5022 Chronic systolic (congestive) heart failure: Secondary | ICD-10-CM

## 2014-07-18 DIAGNOSIS — I255 Ischemic cardiomyopathy: Secondary | ICD-10-CM | POA: Diagnosis not present

## 2014-07-18 LAB — MDC_IDC_ENUM_SESS_TYPE_INCLINIC
Lead Channel Setting Pacing Amplitude: 2 V
Lead Channel Setting Pacing Amplitude: 2.5 V
Lead Channel Setting Pacing Pulse Width: 0.4 ms
Lead Channel Setting Sensing Sensitivity: 0.3 mV
Lead Channel Setting Sensing Sensitivity: 0.3 mV
MDC IDC SET LEADCHNL RA PACING AMPLITUDE: 2 V
MDC IDC SET LEADCHNL RV PACING AMPLITUDE: 2.5 V
MDC IDC SET LEADCHNL RV PACING PULSEWIDTH: 0.4 ms
MDC IDC SET ZONE DETECTION INTERVAL: 350 ms
MDC IDC SET ZONE DETECTION INTERVAL: 480 ms
MDC IDC SET ZONE DETECTION INTERVAL: 270 ms
Zone Setting Detection Interval: 270 ms
Zone Setting Detection Interval: 280 ms
Zone Setting Detection Interval: 510 ms
Zone Setting Detection Interval: 280 ms
Zone Setting Detection Interval: 350 ms
Zone Setting Detection Interval: 480 ms
Zone Setting Detection Interval: 510 ms

## 2014-07-18 NOTE — Patient Instructions (Signed)
Medication Instructions:  Your physician recommends that you continue on your current medications as directed. Please refer to the Current Medication list given to you today.  Labwork: Your physician recommends that you return for lab work on 08/01/14: BMP and CBC   Testing/Procedures: Your physician has recommended that you have an ablation (Ventricular Tachycardia). Catheter ablation is a medical procedure used to treat some cardiac arrhythmias (irregular heartbeats). During catheter ablation, a long, thin, flexible tube is put into a blood vessel in your groin (upper thigh), or neck. This tube is called an ablation catheter. It is then guided to your heart through the blood vessel. Radio frequency waves destroy small areas of heart tissue where abnormal heartbeats may cause an arrhythmia to start. Please see the instruction sheet given to you today.  Follow-Up: We will arrange further follow-up after your procedure.   Any Other Special Instructions Will Be Listed Below (If Applicable).

## 2014-08-01 ENCOUNTER — Other Ambulatory Visit (INDEPENDENT_AMBULATORY_CARE_PROVIDER_SITE_OTHER): Payer: Medicare Other | Admitting: *Deleted

## 2014-08-01 DIAGNOSIS — I10 Essential (primary) hypertension: Secondary | ICD-10-CM

## 2014-08-01 DIAGNOSIS — I5022 Chronic systolic (congestive) heart failure: Secondary | ICD-10-CM

## 2014-08-01 DIAGNOSIS — I472 Ventricular tachycardia, unspecified: Secondary | ICD-10-CM

## 2014-08-01 LAB — CBC
HCT: 37.1 % (ref 36.0–46.0)
HEMOGLOBIN: 12.4 g/dL (ref 12.0–15.0)
MCHC: 33.5 g/dL (ref 30.0–36.0)
MCV: 89.8 fl (ref 78.0–100.0)
Platelets: 210 10*3/uL (ref 150.0–400.0)
RBC: 4.13 Mil/uL (ref 3.87–5.11)
RDW: 16.3 % — ABNORMAL HIGH (ref 11.5–15.5)
WBC: 6 10*3/uL (ref 4.0–10.5)

## 2014-08-01 LAB — BASIC METABOLIC PANEL
BUN: 34 mg/dL — ABNORMAL HIGH (ref 6–23)
CO2: 29 mEq/L (ref 19–32)
Calcium: 9.4 mg/dL (ref 8.4–10.5)
Chloride: 104 mEq/L (ref 96–112)
Creatinine, Ser: 2.15 mg/dL — ABNORMAL HIGH (ref 0.40–1.20)
GFR: 29.79 mL/min — AB (ref >60.00)
Glucose, Bld: 108 mg/dL — ABNORMAL HIGH (ref 70–99)
POTASSIUM: 4 meq/L (ref 3.5–5.1)
Sodium: 138 mEq/L (ref 135–145)

## 2014-08-04 ENCOUNTER — Encounter (HOSPITAL_COMMUNITY): Payer: Self-pay

## 2014-08-04 ENCOUNTER — Ambulatory Visit (HOSPITAL_COMMUNITY)
Admission: RE | Admit: 2014-08-04 | Discharge: 2014-08-05 | Disposition: A | Discharge status: Home / Self Care | Payer: Medicare Other | Source: Ambulatory Visit | Attending: Internal Medicine | Admitting: Internal Medicine

## 2014-08-04 ENCOUNTER — Ambulatory Visit (HOSPITAL_COMMUNITY): Payer: Medicare Other | Admitting: Anesthesiology

## 2014-08-04 ENCOUNTER — Encounter (HOSPITAL_COMMUNITY): Payer: Self-pay | Admitting: Anesthesiology

## 2014-08-04 ENCOUNTER — Encounter: Payer: Self-pay | Admitting: Ophthalmology

## 2014-08-04 ENCOUNTER — Ambulatory Visit (HOSPITAL_COMMUNITY): Admit: 2014-08-04 | Payer: Medicare Other | Admitting: Internal Medicine

## 2014-08-04 ENCOUNTER — Encounter (HOSPITAL_COMMUNITY)
Admission: RE | Disposition: A | Discharge status: Home / Self Care | Payer: Medicare Other | Source: Ambulatory Visit | Attending: Internal Medicine

## 2014-08-04 DIAGNOSIS — Z8673 Personal history of transient ischemic attack (TIA), and cerebral infarction without residual deficits: Secondary | ICD-10-CM | POA: Diagnosis not present

## 2014-08-04 DIAGNOSIS — I255 Ischemic cardiomyopathy: Secondary | ICD-10-CM | POA: Diagnosis not present

## 2014-08-04 DIAGNOSIS — N183 Chronic kidney disease, stage 3 (moderate): Secondary | ICD-10-CM | POA: Insufficient documentation

## 2014-08-04 DIAGNOSIS — Z87891 Personal history of nicotine dependence: Secondary | ICD-10-CM | POA: Diagnosis not present

## 2014-08-04 DIAGNOSIS — I2510 Atherosclerotic heart disease of native coronary artery without angina pectoris: Secondary | ICD-10-CM | POA: Insufficient documentation

## 2014-08-04 DIAGNOSIS — E119 Type 2 diabetes mellitus without complications: Secondary | ICD-10-CM | POA: Diagnosis not present

## 2014-08-04 DIAGNOSIS — Z7982 Long term (current) use of aspirin: Secondary | ICD-10-CM | POA: Insufficient documentation

## 2014-08-04 DIAGNOSIS — K219 Gastro-esophageal reflux disease without esophagitis: Secondary | ICD-10-CM | POA: Diagnosis not present

## 2014-08-04 DIAGNOSIS — I5022 Chronic systolic (congestive) heart failure: Secondary | ICD-10-CM | POA: Insufficient documentation

## 2014-08-04 DIAGNOSIS — I472 Ventricular tachycardia, unspecified: Secondary | ICD-10-CM

## 2014-08-04 DIAGNOSIS — I129 Hypertensive chronic kidney disease with stage 1 through stage 4 chronic kidney disease, or unspecified chronic kidney disease: Secondary | ICD-10-CM | POA: Diagnosis not present

## 2014-08-04 DIAGNOSIS — Z95810 Presence of automatic (implantable) cardiac defibrillator: Secondary | ICD-10-CM | POA: Insufficient documentation

## 2014-08-04 DIAGNOSIS — Z7902 Long term (current) use of antithrombotics/antiplatelets: Secondary | ICD-10-CM | POA: Diagnosis not present

## 2014-08-04 DIAGNOSIS — E785 Hyperlipidemia, unspecified: Secondary | ICD-10-CM | POA: Insufficient documentation

## 2014-08-04 HISTORY — PX: ELECTROPHYSIOLOGIC STUDY: SHX172A

## 2014-08-04 LAB — POCT ACTIVATED CLOTTING TIME
ACTIVATED CLOTTING TIME: 233 s
ACTIVATED CLOTTING TIME: 306 s
Activated Clotting Time: 276 seconds
Activated Clotting Time: 183 seconds
Activated Clotting Time: 177 seconds

## 2014-08-04 LAB — MRSA PCR SCREENING: MRSA by PCR: NEGATIVE

## 2014-08-04 LAB — GLUCOSE, CAPILLARY
GLUCOSE-CAPILLARY: 75 mg/dL (ref 65–99)
Glucose-Capillary: 73 mg/dL (ref 65–99)

## 2014-08-04 SURGERY — V-TACH ABLATION
Anesthesia: Monitor Anesthesia Care

## 2014-08-04 SURGERY — V TACH ABLATION
Anesthesia: Monitor Anesthesia Care

## 2014-08-04 MED ORDER — FAMOTIDINE IN NACL 20-0.9 MG/50ML-% IV SOLN
INTRAVENOUS | Status: AC
  Filled 2014-08-04: qty 50

## 2014-08-04 MED ORDER — AMLODIPINE BESYLATE 10 MG PO TABS
10.0000 mg | ORAL_TABLET | Freq: Every day | ORAL | Status: DC
  Administered 2014-08-05: 10 mg via ORAL
  Filled 2014-08-04: qty 1

## 2014-08-04 MED ORDER — FENTANYL CITRATE (PF) 100 MCG/2ML IJ SOLN
25.0000 ug | Freq: Once | INTRAMUSCULAR | Status: AC
  Administered 2014-08-04: 25 ug via INTRAVENOUS

## 2014-08-04 MED ORDER — FENTANYL CITRATE (PF) 100 MCG/2ML IJ SOLN
INTRAMUSCULAR | Status: DC | PRN
  Administered 2014-08-04 (×2): 25 ug via INTRAVENOUS

## 2014-08-04 MED ORDER — IOHEXOL 350 MG/ML SOLN
INTRAVENOUS | Status: DC | PRN
  Administered 2014-08-04: 1 mL via INTRACARDIAC

## 2014-08-04 MED ORDER — FENTANYL CITRATE (PF) 100 MCG/2ML IJ SOLN
INTRAMUSCULAR | Status: AC
  Administered 2014-08-04: 25 ug via INTRAVENOUS
  Filled 2014-08-04: qty 2

## 2014-08-04 MED ORDER — PROTAMINE SULFATE 10 MG/ML IV SOLN
INTRAVENOUS | Status: DC | PRN
  Administered 2014-08-04 (×2): 10 mg via INTRAVENOUS

## 2014-08-04 MED ORDER — SODIUM CHLORIDE 0.9 % IJ SOLN: 3.0000 mL | INTRAMUSCULAR | Status: DC | PRN

## 2014-08-04 MED ORDER — BUPIVACAINE HCL (PF) 0.25 % IJ SOLN
INTRAMUSCULAR | Status: AC
  Filled 2014-08-04: qty 30

## 2014-08-04 MED ORDER — HEPARIN SODIUM (PORCINE) 1000 UNIT/ML IJ SOLN
INTRAMUSCULAR | Status: AC
  Filled 2014-08-04: qty 1

## 2014-08-04 MED ORDER — DOBUTAMINE IN D5W 4-5 MG/ML-% IV SOLN
INTRAVENOUS | Status: AC
  Filled 2014-08-04: qty 250

## 2014-08-04 MED ORDER — DOBUTAMINE IN D5W 4-5 MG/ML-% IV SOLN
INTRAVENOUS | Status: DC | PRN
  Administered 2014-08-04: 10 ug/kg/min via INTRAVENOUS

## 2014-08-04 MED ORDER — LIDOCAINE HCL (CARDIAC) 20 MG/ML IV SOLN
INTRAVENOUS | Status: DC | PRN
Start: 2014-08-04 — End: 2014-08-04
  Administered 2014-08-04: 50 mg via INTRAVENOUS

## 2014-08-04 MED ORDER — ONDANSETRON HCL 4 MG/2ML IJ SOLN
INTRAMUSCULAR | Status: DC | PRN
  Administered 2014-08-04: 4 mg via INTRAVENOUS

## 2014-08-04 MED ORDER — SODIUM CHLORIDE 0.9 % IV SOLN
INTRAVENOUS | Status: DC
  Administered 2014-08-04 (×2): via INTRAVENOUS

## 2014-08-04 MED ORDER — SODIUM CHLORIDE 0.9 % IV SOLN: 250.0000 mL | INTRAVENOUS | Status: DC | PRN

## 2014-08-04 MED ORDER — ACETAMINOPHEN 325 MG PO TABS: 650.0000 mg | ORAL_TABLET | ORAL | Status: DC | PRN

## 2014-08-04 MED ORDER — ONDANSETRON HCL 4 MG/2ML IJ SOLN: 4.0000 mg | Freq: Four times a day (QID) | INTRAMUSCULAR | Status: DC | PRN

## 2014-08-04 MED ORDER — PROPOFOL INFUSION 10 MG/ML OPTIME
INTRAVENOUS | Status: DC | PRN
  Administered 2014-08-04 (×2): 75 ug/kg/min via INTRAVENOUS

## 2014-08-04 MED ORDER — FENTANYL CITRATE (PF) 100 MCG/2ML IJ SOLN
INTRAMUSCULAR | Status: AC
  Filled 2014-08-04: qty 2

## 2014-08-04 MED ORDER — METOPROLOL SUCCINATE ER 50 MG PO TB24
50.0000 mg | ORAL_TABLET | Freq: Two times a day (BID) | ORAL | Status: DC
  Filled 2014-08-04: qty 1

## 2014-08-04 MED ORDER — FUROSEMIDE 10 MG/ML IJ SOLN
40.0000 mg | Freq: Once | INTRAMUSCULAR | Status: AC
  Administered 2014-08-04: 40 mg via INTRAVENOUS
  Filled 2014-08-04: qty 4

## 2014-08-04 MED ORDER — CLOPIDOGREL BISULFATE 75 MG PO TABS
75.0000 mg | ORAL_TABLET | Freq: Every day | ORAL | Status: DC
  Filled 2014-08-04: qty 1

## 2014-08-04 MED ORDER — DIPHENHYDRAMINE HCL 50 MG/ML IJ SOLN
INTRAMUSCULAR | Status: DC | PRN
  Administered 2014-08-04 (×4): 12.5 mg via INTRAVENOUS

## 2014-08-04 MED ORDER — POTASSIUM CHLORIDE CRYS ER 10 MEQ PO TBCR
10.0000 meq | EXTENDED_RELEASE_TABLET | Freq: Every day | ORAL | Status: DC
  Administered 2014-08-04: 10 meq via ORAL
  Filled 2014-08-04 (×2): qty 1

## 2014-08-04 MED ORDER — AMIODARONE HCL 200 MG PO TABS
200.0000 mg | ORAL_TABLET | Freq: Every day | ORAL | Status: DC
  Administered 2014-08-04 – 2014-08-05 (×2): 200 mg via ORAL
  Filled 2014-08-04 (×2): qty 1

## 2014-08-04 MED ORDER — HEPARIN SODIUM (PORCINE) 1000 UNIT/ML IJ SOLN
INTRAMUSCULAR | Status: DC | PRN
  Administered 2014-08-04: 4000 [IU] via INTRAVENOUS
  Administered 2014-08-04: 10000 [IU] via INTRAVENOUS
  Administered 2014-08-04: 3000 [IU] via INTRAVENOUS

## 2014-08-04 MED ORDER — LIDOCAINE-EPINEPHRINE 1 %-1:100000 IJ SOLN
10.0000 mL | INTRAMUSCULAR | Status: AC
Start: 2014-08-04 — End: 2014-08-04
  Administered 2014-08-04: 10 mL via INTRADERMAL
  Filled 2014-08-04: qty 10

## 2014-08-04 MED ORDER — FAMOTIDINE IN NACL 20-0.9 MG/50ML-% IV SOLN
20.0000 mg | INTRAVENOUS | Status: AC
  Administered 2014-08-04: 20 mg via INTRAVENOUS

## 2014-08-04 MED ORDER — ASPIRIN EC 81 MG PO TBEC
81.0000 mg | DELAYED_RELEASE_TABLET | Freq: Every day | ORAL | Status: DC
  Administered 2014-08-05: 81 mg via ORAL
  Filled 2014-08-04: qty 1

## 2014-08-04 MED ORDER — ISOSORBIDE MONONITRATE ER 30 MG PO TB24
30.0000 mg | ORAL_TABLET | Freq: Every day | ORAL | Status: DC
  Administered 2014-08-05: 30 mg via ORAL
  Filled 2014-08-04: qty 1

## 2014-08-04 MED ORDER — PHENYLEPHRINE HCL 10 MG/ML IJ SOLN
INTRAMUSCULAR | Status: DC | PRN
  Administered 2014-08-04: 40 ug via INTRAVENOUS
  Administered 2014-08-04: 120 ug via INTRAVENOUS

## 2014-08-04 MED ORDER — MIDAZOLAM HCL 5 MG/5ML IJ SOLN
INTRAMUSCULAR | Status: DC | PRN
  Administered 2014-08-04: 2 mg via INTRAVENOUS

## 2014-08-04 MED ORDER — GLIPIZIDE ER 2.5 MG PO TB24
2.5000 mg | ORAL_TABLET | Freq: Every day | ORAL | Status: DC
  Administered 2014-08-05: 2.5 mg via ORAL
  Filled 2014-08-04 (×2): qty 1

## 2014-08-04 MED ORDER — SODIUM CHLORIDE 0.9 % IJ SOLN
3.0000 mL | Freq: Two times a day (BID) | INTRAMUSCULAR | Status: DC
  Administered 2014-08-04 – 2014-08-05 (×3): 3 mL via INTRAVENOUS

## 2014-08-04 SURGICAL SUPPLY — 19 items
BAG SNAP BAND KOVER 36X36 (MISCELLANEOUS) ×2 IMPLANT
CATH JOSEPHSON QUAD-ALLRED 6FR (CATHETERS) ×2 IMPLANT
CATH NAVISTAR SMARTTOUCH DF (ABLATOR) ×2 IMPLANT
CATH SOUNDSTAR 3D IMAGING (CATHETERS) ×2 IMPLANT
CATH WEBSTER BI DIR CS D-F CRV (CATHETERS) ×2 IMPLANT
COVER SWIFTLINK CONNECTOR (BAG) ×2 IMPLANT
NEEDLE TRANSEP BRK 71CM 407200 (NEEDLE) ×2 IMPLANT
PACK EP LATEX FREE (CUSTOM PROCEDURE TRAY) ×2
PACK EP LF (CUSTOM PROCEDURE TRAY) ×1 IMPLANT
PAD DEFIB LIFELINK (PAD) ×2 IMPLANT
PATCH CARTO3 (PAD) ×2 IMPLANT
SHEATH AGILIS NXT 8.5F 71CM (SHEATH) ×2 IMPLANT
SHEATH AVANTI 11F 11CM (SHEATH) ×2 IMPLANT
SHEATH PINNACLE 4F 10CM (SHEATH) ×2 IMPLANT
SHEATH PINNACLE 7F 10CM (SHEATH) ×4 IMPLANT
SHEATH PINNACLE 8F 10CM (SHEATH) ×2 IMPLANT
SHEATH PINNACLE 9F 10CM (SHEATH) ×2 IMPLANT
SHEATH SWARTZ TS SL2 63CM 8.5F (SHEATH) ×2 IMPLANT
TUBING SMART ABLATE COOLFLOW (TUBING) ×2 IMPLANT

## 2014-08-04 NOTE — Addendum Note (Signed)
Addendum  created 08/04/14 1319 by Jenne Campus, CRNA   Modules edited: Anesthesia Attestations, Charges VN

## 2014-08-04 NOTE — Anesthesia Postprocedure Evaluation (Signed)
  Anesthesia Post-op Note  Patient: [name] Terry  Procedure(s) Performed: Procedure(s): V Tach Ablation (N/A)  Patient Location: PACU  Anesthesia Type:MAC  Level of Consciousness: awake, oriented, sedated and patient cooperative  Airway and Oxygen Therapy: Patient Spontanous Breathing  Post-op Pain: none  Post-op Assessment: Post-op Vital signs reviewed, Patient's Cardiovascular Status Stable, Respiratory Function Stable, Patent Airway, No signs of Nausea or vomiting and Pain level controlled  Post-op Vital Signs: stable  Last Vitals:  Filed Vitals:   08/04/14 0540  BP: 141/89  Pulse: 68  Temp: 36.6 C  Resp: 18    Complications: No apparent anesthesia complications

## 2014-08-04 NOTE — Interval H&P Note (Signed)
History and Physical Interval Note:  08/04/2014 7:31 AM  [name] Terry  has presented today for surgery, with the diagnosis of vt  The various methods of treatment have been discussed with the patient and family. After consideration of risks, benefits and other options for treatment, the patient has consented to  Procedure(s): V Tach Ablation (N/A) as a surgical intervention .  The patient's history has been reviewed, patient examined, no change in status, stable for surgery.  I have reviewed the patient's chart and labs.  Questions were answered to the patient's satisfaction.    Therapeutic strategies for ventricular tachycardia including medicine and ablation were discussed in detail with the patient today. Risk, benefits, and alternatives to EP study and radiofrequency ablation were also discussed in detail today. These risks include but are not limited to stroke, bleeding, vascular damage, tamponade, perforation, damage to the heart and other structures, AV block requiring pacemaker, defibrillator lead dislodgement, worsening renal function, and death. The patient understands these risk and wishes to proceed.  We will therefore proceed with catheter ablation at the next available time.   Thompson Grayer

## 2014-08-04 NOTE — Anesthesia Preprocedure Evaluation (Signed)
Anesthesia Evaluation  Patient identified by MRN, date of birth, ID band Patient awake    Reviewed: Allergy & Precautions, NPO status , Patient's Chart, lab work & pertinent test results  Airway Mallampati: I       Dental   Pulmonary former smoker,    Pulmonary exam normal       Cardiovascular hypertension, + CAD, + Past MI and +CHF Normal cardiovascular exam+ dysrhythmias Supra Ventricular Tachycardia + Cardiac Defibrillator     Neuro/Psych    GI/Hepatic GERD-  ,  Endo/Other  diabetes, Type 2, Oral Hypoglycemic Agents  Renal/GU CRFRenal disease     Musculoskeletal   Abdominal   Peds  Hematology   Anesthesia Other Findings   Reproductive/Obstetrics                             Anesthesia Physical Anesthesia Plan  ASA: III  Anesthesia Plan: MAC and General   Post-op Pain Management:    Induction: Intravenous  Airway Management Planned: Oral ETT and Mask  Additional Equipment:   Intra-op Plan:   Post-operative Plan:   Informed Consent: I have reviewed the patients History and Physical, chart, labs and discussed the procedure including the risks, benefits and alternatives for the proposed anesthesia with the patient or authorized representative who has indicated his/her understanding and acceptance.     Plan Discussed with: CRNA, Anesthesiologist and Surgeon  Anesthesia Plan Comments:         Anesthesia Quick Evaluation

## 2014-08-04 NOTE — Progress Notes (Signed)
Hematoma noted when R groin site checked. Pressure applied and cath lab notified. Cath lab RN came to hold pressure. Pressure held until 1500. Site is level 1 with minimal oozing and a small hematoma. Physician and NP aware and at bedside. Will continue to monitor closely. Pt VSS. Resting comfortably in bed.

## 2014-08-04 NOTE — Transfer of Care (Signed)
Immediate Anesthesia Transfer of Care Note  Patient: [name] Terry  Procedure(s) Performed: Procedure(s): V Tach Ablation (N/A)  Patient Location: Cath Lab  Anesthesia Type:MAC  Level of Consciousness: awake, oriented and patient cooperative  Airway & Oxygen Therapy: Patient Spontanous Breathing and Patient connected to face mask oxygen  Post-op Assessment: Report given to RN and Post -op Vital signs reviewed and stable  Post vital signs: Reviewed  Last Vitals:  Filed Vitals:   08/04/14 0540  BP: 141/89  Pulse: 68  Temp: 36.6 C  Resp: 18    Complications: No apparent anesthesia complications

## 2014-08-04 NOTE — Progress Notes (Signed)
     Left eye irritation. Does not complain of visual loss.  Diffuse conjunctival irritation. EOMI Increased lacrimation. Injected sclera. O.S.  Has sensation of "bump" upper eye. This is change from pre to post op.   Consulted opthalmology. Thank you for assistance.  Candee Furbish, MD

## 2014-08-04 NOTE — H&P (View-Only) (Signed)
Electrophysiology Office Note Date: 07/18/2014  ID:  [name] Terry, DOB [birth year], MRN [id number]  PCP: Bronson Curb, PA-C Primary Cardiologist: Johnsie Cancel Electrophysiologist: Allred  CC: VT follow up  [name] Terry is a 64 y.o. female seen today for Dr. Rayann Heman.  She was admitted 04/2014 with ventricular tachycardia.  Catheterization demonstrated no targets for revascularization and she was placed on amiodarone. She had recurrent VT in 05/2014 and underwent VT ablation.  She has been maintained on amiodarone and has had recurrent VT associated with syncope and dizziness.  Since she was seen last 2 weeks ago, she reports ongoing symptoms.  Device interrogation demonstrates recurrent VT terminated with ATP and multiple non-sustained VT episodes.  She denies dyspnea, PND, orthopnea, syncope, edema, weight gain, or early satiety.  She has not had further ICD therapy.   Device History: MDT dual chamber ICD implanted 2013 for ischemic cardiomyopathy by Dr Westley Gambles at [facility] History of appropriate therapy: yes History of AAD therapy: yes, amiodarone initiated 04/2014 for VT, s/p ablation 05/2014 Dr Rayann Heman   Past Medical History  Diagnosis Date  . Ischemic cardiomyopathy     a. s/p MDT dual chamber ICD implanted 2013 by Dr Westley Gambles at [facility], now followed by Dr Rayann Heman  . History of stroke      a. 1993  . Coronary artery disease     a. s/p previous interventions in [place] per patient, no records available b. cath 04/2013 with non-obstructive disease   . Lymphoma     a. s/p chemo/radiation 2009  . CHF (congestive heart failure)   . HTN (hypertension)   . HLD (hyperlipidemia)   . Ventricular tachycardia     a. CL 300 msec requiring ICD shocks therpay 5/14 b. recurrent VT 04/2014, placed on amiodarone c. recurrent VT 05/2014 s/p ablation by Dr Rayann Heman  . DM2 (diabetes mellitus, type 2), newly diagnosed    . CKD (chronic kidney disease) stage 3, GFR 30-59 ml/min    . Polymyalgia rheumatica   .  Depression   . GERD (gastroesophageal reflux disease)    Past Surgical History  Procedure Laterality Date  . Cardiac defibrillator placement  03/2011    MDT ICD implanted at [facility] by Dr Tana Coast  . Coronary angioplasty with stent placement    . Left and right heart catheterization with coronary angiogram N/A 05/03/2013    non-obstructive CAD  . V-tach ablation N/A 05/26/2014    VT ablation by Dr Rayann Heman - PVCs arising from the inferolateral LV, extensive substrate ablation    Current Outpatient Prescriptions  Medication Sig Dispense Refill  . amiodarone (PACERONE) 200 MG tablet Take 1 tablet twice a day for 2 weeks. Then take 1 tablet daily (Patient taking differently: Take 200 mg by mouth daily. Take 1 tablet once a day for 2 weeks. Then take 1 tablet daily)    . amLODipine (NORVASC) 10 MG tablet Take 10 mg by mouth daily.    Marland Kitchen aspirin EC 81 MG tablet Take 81 mg by mouth daily.    Marland Kitchen atorvastatin (LIPITOR) 40 MG tablet Take 1 tablet (40 mg total) by mouth daily at 6 PM. (Patient taking differently: Take 40 mg by mouth at bedtime. ) 90 tablet 30  . clopidogrel (PLAVIX) 75 MG tablet Take 75 mg by mouth at bedtime.     Marland Kitchen ezetimibe (ZETIA) 10 MG tablet Take 10 mg by mouth at bedtime.     Marland Kitchen glipiZIDE (GLUCOTROL XL) 2.5 MG 24 hr tablet Take 2.5 mg  by mouth daily with breakfast.    . isosorbide mononitrate (IMDUR) 30 MG 24 hr tablet Take 30 mg by mouth daily.    . metoprolol succinate (TOPROL-XL) 50 MG 24 hr tablet Take 1 tablet (50 mg total) by mouth 2 (two) times daily. Take with or immediately following a meal. 180 tablet 0  . nitroGLYCERIN (NITROSTAT) 0.4 MG SL tablet Place 1 tablet (0.4 mg total) under the tongue every 5 (five) minutes as needed for chest pain. 25 tablet 3  . potassium chloride (K-DUR,KLOR-CON) 10 MEQ tablet Take 10 mEq by mouth at bedtime.      No current facility-administered medications for this visit.    Allergies:   Flagyl; Contrast media; Ioxaglate; Potassium sulfate; and  Potassium-containing compounds   Social History: History   Social History  . Marital Status: Single    Spouse Name: N/A  . Number of Children: 2  . Years of Education: N/A   Occupational History  . Not on file.   Social History Main Topics  . Smoking status: Former Smoker -- 0.25 packs/day    Quit date: 05/20/2014  . Smokeless tobacco: Never Used     Comment: she is not ready to quit  . Alcohol Use: No  . Drug Use: No  . Sexual Activity: Not on file   Other Topics Concern  . Not on file   Social History Narrative    Family History: Family History  Problem Relation Age of Onset  . Diabetes Father   . Hypertension Father   . Heart disease Father   . Alcoholism Father   . Hypertension Mother   . Heart disease Mother   . Breast cancer Mother   . Stroke Mother   . Diabetes Brother   . Diabetes Brother   . Diabetes Brother   . Diabetes Brother   . Hypertension Sister   . Heart disease Sister   . Alcoholism Sister   . Thyroid disease Sister   . Heart attack Sister   . Heart attack Brother   . Stroke Brother     Review of Systems: All other systems reviewed and are otherwise negative except as noted above.   Physical Exam: VS:  BP 138/80 mmHg  Pulse 61  Ht 5\' 4"  (1.626 m)  Wt 184 lb 12.8 oz (83.825 kg)  BMI 31.71 kg/m2 , BMI Body mass index is 31.71 kg/(m^2).  GEN- The patient is well appearing, alert and oriented x 3 today.   HEENT: normocephalic, atraumatic; sclera clear, conjunctiva pink; hearing intact; oropharynx clear; neck supple, no JVP Lymph- no cervical lymphadenopathy Lungs- Clear to ausculation bilaterally, normal work of breathing.  No wheezes, rales, rhonchi Heart- Regular rate and rhythm, 2/6 SEM, rubs or gallops  GI- soft, non-tender, non-distended, bowel sounds present  Extremities- no clubbing, cyanosis, or edema; DP/PT/radial pulses 1+ bilaterally MS- no significant deformity or atrophy Skin- warm and dry, no rash or lesion; ICD  pocket well healed Psych- euthymic mood, full affect Neuro- strength and sensation are intact  ICD interrogation- reviewed in detail today,  See PACEART report  Recent Labs: 05/19/2014: Magnesium 2.5; TSH 12.116* 06/29/2014: ALT 93*; BUN 25*; Creatinine 1.96*; Hemoglobin 12.4; Platelets 180; Potassium 4.2; Sodium 138   Wt Readings from Last 3 Encounters:  07/18/14 184 lb 12.8 oz (83.825 kg)  07/04/14 185 lb 9.6 oz (84.188 kg)  06/20/14 184 lb (83.462 kg)     Other studies Reviewed: Additional studies/ records that were reviewed today include: hospital records, ER  records, EKG 06/02/14   Assessment and Plan:  1.  Ventricular tachycardia Device interrogation today demonstrates increased NSVT episodes, with 2 episodes of VT terminated with ATP (cycle length 431msec).   Continue amiodarone 200mg  daily.  She is unable to tolerate higher doses.  Renal function makes adding sotalol less than ideal.  D/w Dr Rayann Heman, recommend repeat ablation with CARTO and anesthesia.  Risks, benefits of repeat ablation discussed with the patient who wishes to proceed.  No driving X6 months (pt aware)  2.  Chronic systolic dysfunction euvolemic today Stable on an appropriate medical regimen Normal ICD function See Pace Art report No changes today  3.  CAD/ICM Catheterization 04/2014 with moderate CAD but no targets for revascularization.  Continue medical therapy   4.  HTN Stable No change required today  5.  CKD Improved with last labs Continue to hold Spironolactone, lisinopril. Will recheck BMET with pre-procedure labs.    Current medicines are reviewed at length with the patient today.   The patient does not have concerns regarding her medicines.  The following changes were made today:  none   Disposition:   Follow up after ablation   Signed, Chanetta Marshall, NP 07/18/2014 10:20 AM  [facility] [address] [place] [address] [phone]  (office) [phone] (fax)

## 2014-08-04 NOTE — Progress Notes (Signed)
Toni Arthurs RN RCIS lunch relief for [employer]

## 2014-08-04 NOTE — Progress Notes (Signed)
14fr arterial sheath removed RFA and direct pressure held x66min RFV sheaths removed .Pressure to site x 15 min. Site level 0. Rt pt palpable. Dressing applied. Pt teaching done.bedrest 1315---1915.

## 2014-08-04 NOTE — Progress Notes (Unsigned)
Asked to evaluate pt with redness and fbs os after general anesthesia. No previous hx of any eye problems. Pt denies changes in va. PMH, PSH, Meds, Allx, SH and FHx reviewed.  Exam: 20/25 ou, eom full ou, pupils perrl, no apd, vft full ou Ant segment exam: + for linear, horizontal epi defect in the paracentral cornea os, otherwise wnl ou Fundus exam: nerves wnl, mac flat ou and periphery flat x 4 ou  A/P: K abrasion os. Start erythromycin ung bid x 4 days. Fu in my office [phone] on 08/08/14 at 1300, or sooner with changes or concern.  Dorann Ou, MD Ophthalmology

## 2014-08-05 ENCOUNTER — Encounter (HOSPITAL_COMMUNITY): Payer: Self-pay | Admitting: Internal Medicine

## 2014-08-05 DIAGNOSIS — I5022 Chronic systolic (congestive) heart failure: Secondary | ICD-10-CM | POA: Diagnosis not present

## 2014-08-05 DIAGNOSIS — Z8673 Personal history of transient ischemic attack (TIA), and cerebral infarction without residual deficits: Secondary | ICD-10-CM | POA: Diagnosis not present

## 2014-08-05 DIAGNOSIS — I255 Ischemic cardiomyopathy: Secondary | ICD-10-CM | POA: Diagnosis not present

## 2014-08-05 DIAGNOSIS — I472 Ventricular tachycardia: Secondary | ICD-10-CM | POA: Diagnosis not present

## 2014-08-05 LAB — BASIC METABOLIC PANEL
Anion gap: 11 (ref 5–15)
BUN: 26 mg/dL — ABNORMAL HIGH (ref 6–20)
CHLORIDE: 105 mmol/L (ref 101–111)
CO2: 24 mmol/L (ref 22–32)
CREATININE: 2.18 mg/dL — AB (ref 0.44–1.00)
Calcium: 8.9 mg/dL (ref 8.9–10.3)
GFR calc Af Amer: 27 mL/min — ABNORMAL LOW (ref >60)
GFR calc non Af Amer: 23 mL/min — ABNORMAL LOW (ref >60)
GLUCOSE: 134 mg/dL — AB (ref 65–99)
Potassium: 3.9 mmol/L (ref 3.5–5.1)
Sodium: 140 mmol/L (ref 135–145)

## 2014-08-05 MED ORDER — ERYTHROMYCIN 5 MG/GM OP OINT
TOPICAL_OINTMENT | Freq: Two times a day (BID) | OPHTHALMIC | Status: DC
  Administered 2014-08-05: 10:00:00 via OPHTHALMIC
  Filled 2014-08-05: qty 3.5

## 2014-08-05 MED ORDER — AMIODARONE HCL 200 MG PO TABS: 200.0000 mg | ORAL_TABLET | Freq: Every day | ORAL | Status: DC

## 2014-08-05 MED FILL — Bupivacaine HCl Preservative Free (PF) Inj 0.25%: INTRAMUSCULAR | Qty: 60 | Status: AC

## 2014-08-05 NOTE — Discharge Instructions (Signed)
No driving for 6 months. No lifting over 5 lbs for 1 week. No sexual activity for 1 week.  Keep procedure site clean & dry. If you notice increased pain, swelling, bleeding or pus, call/return!  You may shower, but no soaking baths/hot tubs/pools for 1 week.  ° ° °

## 2014-08-05 NOTE — Progress Notes (Signed)
    SUBJECTIVE: The patient is doing well today.  At this time, she denies chest pain, shortness of breath, or any new concerns.  Marland Kitchen amiodarone  200 mg Oral Daily  . amLODipine  10 mg Oral Daily  . aspirin EC  81 mg Oral Daily  . clopidogrel  75 mg Oral QHS  . erythromycin   Left Eye BID  . glipiZIDE  2.5 mg Oral Q breakfast  . isosorbide mononitrate  30 mg Oral Daily  . metoprolol succinate  50 mg Oral BID  . potassium chloride  10 mEq Oral QHS  . sodium chloride  3 mL Intravenous Q12H      OBJECTIVE: Physical Exam: Filed Vitals:   08/05/14 0422 08/05/14 0500 08/05/14 0600 08/05/14 0742  BP: 117/64 126/68 126/77 132/70  Pulse: 67 54 58 60  Temp: 98.6 F (37 C)   98 F (36.7 C)  TempSrc: Oral   Oral  Resp: 16 20 26 15   Height:      Weight:      SpO2: 100% 100% 100% 100%    Intake/Output Summary (Last 24 hours) at 08/05/14 0853 Last data filed at 08/05/14 0800  Gross per 24 hour  Intake   1310 ml  Output   2400 ml  Net  -1090 ml    Telemetry reveals AV pacing, PVC's, NSVT  GEN- The patient is well appearing, alert and oriented x 3 today.   Head- normocephalic, atraumatic Eyes-  Sclera clear, conjunctiva pink  Ears- hearing intact Oropharynx- clear Neck- supple, no JVP Lymph- no cervical lymphadenopathy Lungs- Clear to ausculation bilaterally, normal work of breathing Heart- Irregular rate and rhythm  GI- soft, NT, ND, + BS Extremities- no clubbing, cyanosis, or edema, small right groin hematoma, DP/PT pulses 2+ Skin- no rash or lesion Psych- euthymic mood, full affect Neuro- strength and sensation are intact  LABS: Basic Metabolic Panel:  Recent Labs  08/05/14 0223  NA 140  K 3.9  CL 105  CO2 24  GLUCOSE 134*  BUN 26*  CREATININE 2.18*  CALCIUM 8.9    ASSESSMENT AND PLAN:  Active Problems:   Ventricular tachycardia   VT (ventricular tachycardia)  1.  Ventricular tachycardia S/p RFCA 08/04/14 Small right groin hematoma, no bruit Continue  Amiodarone 200mg  daily No driving x6 months  2.  Chronic systolic dysfunction Euvolemic on exam Will arrange follow up in AHF clinic   3.  HTN Stable No change required today  4.  CAD/ICM Continue medical therapy  If stable after ambulation this morning, will plan discharge this afternoon. Follow up with me in 1-2 weeks, follow up with Dr Rayann Heman in 4 weeks.   Chanetta Marshall, NP 08/05/2014 8:56 AM  I have seen, examined the patient, and reviewed the above assessment and plan.  On exam,R groin has small hematoma, no bruit.  Changes to above are made where necessary.    Co Sign: Thompson Grayer, MD 08/05/2014

## 2014-08-05 NOTE — Discharge Summary (Signed)
ELECTROPHYSIOLOGY PROCEDURE DISCHARGE SUMMARY    Patient ID: [name] Terry,  MRN: [id number], DOB/AGE: 10/12/1950 64 y.o.  Admit date: 08/04/2014 Discharge date: 08/05/2014  Primary Care Physician: Mackey Birchwood Primary Cardiologist: Johnsie Cancel Electrophysiologist: Lema Heinkel  Primary Discharge Diagnosis:  Ventricular tachycardia status post ablation this admission  Secondary Discharge Diagnosis:  1.  Ischemic cardiomyopathy s/p MDT ICD 2.  Chronic systolic heart failure 3.  Prior CVA 4.  Lymphoma 5.  Hypertension 6.  Hyperlipidemia 7.  Diabetes 8.  Chronic kidney disease 9.  GERD 10.  Chronic systolic heart failure  Allergies  Allergen Reactions  . Flagyl [Metronidazole] Swelling and Rash    Face swells   . Contrast Media [Iodinated Diagnostic Agents] Rash  . Ioxaglate Rash  . Potassium Sulfate Rash and Other (See Comments)    Headaches  . Potassium-Containing Compounds Other (See Comments)    Headaches-- reports no problems now     Procedures This Admission:  1.  Electrophysiology study and radiofrequency catheter ablation on 08/04/14 by Dr Rayann Heman.  See op note for full details. There were no early apparent complications.   Brief HPI/Hospital Course:  [name] Terry is a 64 y.o. female with a past medical history as outlined above.  She was admitted 04/2014 with ventricular tachycardia. Catheterization demonstrated no targets for revascularization and she was placed on amiodarone. She had recurrent VT in 05/2014 and underwent VT ablation. She has been maintained on amiodarone and has had recurrent VT associated with syncope and dizziness. Risks, and benefits to repeat ablation were discussed with the patient who wished to proceed.  The patient was admitted and underwent EPS/ablation of ventricular tachycardia by Dr Rayann Heman with details as outlined in his procedure note.  Post procedure, she had a small right groin hematoma that was compressed. She was monitored  overnight on telemetry which demonstrated AV pacing with PVC's and short run of NSVT.  She was seen by Dr Rayann Heman and considered stable for discharge to home with close outpatient follow up.  No driving x6 months (pt aware).   Physical Exam: Filed Vitals:   08/05/14 0422 08/05/14 0500 08/05/14 0600 08/05/14 0742  BP: 117/64 126/68 126/77 132/70  Pulse: 67 54 58 60  Temp: 98.6 F (37 C)   98 F (36.7 C)  TempSrc: Oral   Oral  Resp: 16 20 26 15   Height:      Weight:      SpO2: 100% 100% 100% 100%    Labs:   Lab Results  Component Value Date   WBC 6.0 08/01/2014   HGB 12.4 08/01/2014   HCT 37.1 08/01/2014   MCV 89.8 08/01/2014   PLT 210.0 08/01/2014     Recent Labs Lab 08/05/14 0223  NA 140  K 3.9  CL 105  CO2 24  BUN 26*  CREATININE 2.18*  CALCIUM 8.9  GLUCOSE 134*     Discharge Medications:    Medication List    TAKE these medications        amiodarone 200 MG tablet  Commonly known as:  PACERONE  Take 1 tablet (200 mg total) by mouth daily.     amLODipine 10 MG tablet  Commonly known as:  NORVASC  Take 10 mg by mouth daily.     aspirin EC 81 MG tablet  Take 81 mg by mouth daily.     atorvastatin 40 MG tablet  Commonly known as:  LIPITOR  Take 1 tablet (40 mg total) by mouth  daily at 6 PM.     clopidogrel 75 MG tablet  Commonly known as:  PLAVIX  Take 75 mg by mouth at bedtime.     ezetimibe 10 MG tablet  Commonly known as:  ZETIA  Take 10 mg by mouth at bedtime.     glipiZIDE 2.5 MG 24 hr tablet  Commonly known as:  GLUCOTROL XL  Take 2.5 mg by mouth daily with breakfast.     isosorbide mononitrate 30 MG 24 hr tablet  Commonly known as:  IMDUR  Take 30 mg by mouth daily.     metoprolol succinate 50 MG 24 hr tablet  Commonly known as:  TOPROL-XL  Take 1 tablet (50 mg total) by mouth 2 (two) times daily. Take with or immediately following a meal.     nitroGLYCERIN 0.4 MG SL tablet  Commonly known as:  NITROSTAT  Place 1 tablet (0.4 mg  total) under the tongue every 5 (five) minutes as needed for chest pain.     potassium chloride 10 MEQ tablet  Commonly known as:  K-DUR,KLOR-CON  Take 10 mEq by mouth at bedtime.        Disposition:  Discharge Instructions    Diet - low sodium heart healthy    Complete by:  As directed      Increase activity slowly    Complete by:  As directed           Follow-up Information    Follow up with Patsey Berthold, NP On 08/17/2014.   Specialty:  Nurse Practitioner   Why:  at [facility] information:   [place] [address] [phone]       Follow up with Thompson Grayer, MD On 09/12/2014.   Specialty:  Cardiology   Why:  at [facility] information:   [place] [address] [phone]       Duration of Discharge Encounter: Greater than 30 minutes including physician time.  Signed, Chanetta Marshall, NP 08/05/2014 11:25 AM   I have seen, examined the patient, and reviewed the above assessment and plan.  Changes to above are made where necessary.    Co Sign: Thompson Grayer, MD 08/05/2014

## 2014-08-08 ENCOUNTER — Ambulatory Visit: Payer: Medicare Other | Admitting: Internal Medicine

## 2014-08-16 ENCOUNTER — Encounter: Payer: Self-pay | Admitting: Nurse Practitioner

## 2014-08-16 NOTE — Progress Notes (Signed)
Electrophysiology Office Note Date: 08/17/2014  ID:  [name], DOB [birth year], MRN [id number]  PCP: Bronson Curb, PA-C Primary Cardiologist: Johnsie Cancel Electrophysiologist: Allred  CC: VT follow up  [name] is a 64 y.o. female seen today for Dr. Rayann Heman.  She was admitted 04/2014 with ventricular tachycardia.  Catheterization demonstrated no targets for revascularization and she was placed on amiodarone. She had recurrent VT in 05/2014 and underwent VT ablation.  She has been maintained on amiodarone and has had recurrent VT associated with syncope and dizziness.  Device interrogation demonstrates recurrent VT terminated with ATP and multiple non-sustained VT episodes.  She then underwent repeat VT ablation 08/04/14 by Dr Rayann Heman.  Since discharge, she reports doing reasonably well. She denies PND, orthopnea, syncope, or early satiety.  She has not had further ICD therapy.  She has had weight gain/shortness of breath with exertion and reports compliance with low sodium diet.   Device History: MDT dual chamber ICD implanted 2013 for ischemic cardiomyopathy by Dr Westley Gambles at [name] History of appropriate therapy: yes History of AAD therapy: yes, amiodarone initiated 04/2014 for VT, s/p ablation 05/2014 Dr Rayann Heman, ablation 07/2014 Dr Rayann Heman   Past Medical History  Diagnosis Date  . Ischemic cardiomyopathy     a. s/p MDT dual chamber ICD implanted 2013 by Dr Westley Gambles at [facility], now followed by Dr Rayann Heman  . History of stroke      a. 1993  . Coronary artery disease     a. s/p previous interventions in [name] per patient, no records available b. cath 04/2013 with non-obstructive disease   . Lymphoma     a. s/p chemo/radiation 2009  . CHF (congestive heart failure)   . HTN (hypertension)   . HLD (hyperlipidemia)   . Ventricular tachycardia     a. CL 300 msec requiring ICD shocks therpay 5/14 b. recurrent VT 04/2014, placed on amiodarone c. recurrent VT 05/2014 s/p ablation by Dr Rayann Heman d.  s/p repeat ablation 07/2014  . DM2 (diabetes mellitus, type 2), newly diagnosed    . CKD (chronic kidney disease) stage 3, GFR 30-59 ml/min    . Polymyalgia rheumatica   . Depression   . GERD (gastroesophageal reflux disease)    Past Surgical History  Procedure Laterality Date  . Cardiac defibrillator placement  03/2011    MDT ICD implanted at [name] by Dr Tana Coast  . Coronary angioplasty with stent placement    . Left and right heart catheterization with coronary angiogram N/A 05/03/2013    non-obstructive CAD  . V-tach ablation N/A 05/26/2014    VT ablation by Dr Rayann Heman - PVCs arising from the inferolateral LV, extensive substrate ablation  . Electrophysiologic study N/A 08/04/2014    Procedure: V Tach Ablation;  Surgeon: Thompson Grayer, MD;  Location: [place] [facility];  Service: Cardiovascular;  Laterality: N/A;    Current Outpatient Prescriptions  Medication Sig Dispense Refill  . amiodarone (PACERONE) 200 MG tablet Take 1 tablet (200 mg total) by mouth daily. 30 tablet 1  . amLODipine (NORVASC) 10 MG tablet Take 10 mg by mouth daily.    Marland Kitchen aspirin EC 81 MG tablet Take 81 mg by mouth daily.    Marland Kitchen atorvastatin (LIPITOR) 40 MG tablet Take 1 tablet (40 mg total) by mouth daily at 6 PM. 90 tablet 30  . clopidogrel (PLAVIX) 75 MG tablet Take 75 mg by mouth at bedtime.     Marland Kitchen ezetimibe (ZETIA) 10 MG tablet Take 10 mg by  mouth at bedtime.     Marland Kitchen glipiZIDE (GLUCOTROL XL) 2.5 MG 24 hr tablet Take 2.5 mg by mouth daily with breakfast.    . isosorbide mononitrate (IMDUR) 30 MG 24 hr tablet Take 30 mg by mouth daily.    . metoprolol succinate (TOPROL-XL) 50 MG 24 hr tablet Take 1 tablet (50 mg total) by mouth 2 (two) times daily. Take with or immediately following a meal. 180 tablet 0  . nitroGLYCERIN (NITROSTAT) 0.4 MG SL tablet Place 1 tablet (0.4 mg total) under the tongue every 5 (five) minutes as needed for chest pain. 25 tablet 3  . potassium chloride (K-DUR,KLOR-CON) 10 MEQ tablet Take 10 mEq by  mouth at bedtime.      No current facility-administered medications for this visit.    Allergies:   Flagyl; Contrast media; Ioxaglate; Potassium sulfate; and Potassium-containing compounds   Social History: History   Social History  . Marital Status: Single    Spouse Name: N/A  . Number of Children: 2  . Years of Education: N/A   Occupational History  . Not on file.   Social History Main Topics  . Smoking status: Former Smoker -- 0.25 packs/day    Quit date: 05/20/2014  . Smokeless tobacco: Never Used     Comment: she is not ready to quit  . Alcohol Use: No  . Drug Use: No  . Sexual Activity: Not on file   Other Topics Concern  . Not on file   Social History Narrative    Family History: Family History  Problem Relation Age of Onset  . Diabetes Father   . Hypertension Father   . Heart disease Father   . Alcoholism Father   . Hypertension Mother   . Heart disease Mother   . Breast cancer Mother   . Stroke Mother   . Diabetes Brother   . Diabetes Brother   . Diabetes Brother   . Diabetes Brother   . Hypertension Sister   . Heart disease Sister   . Alcoholism Sister   . Thyroid disease Sister   . Heart attack Sister   . Heart attack Brother   . Stroke Brother     Review of Systems: All other systems reviewed and are otherwise negative except as noted above.   Physical Exam: VS:  BP 124/68 mmHg  Ht 5\' 5"  (1.651 m)  Wt 192 lb (87.091 kg)  BMI 31.95 kg/m2 , BMI Body mass index is 31.95 kg/(m^2).  GEN- The patient is elderly, obese appearing, alert and oriented x 3 today.   HEENT: normocephalic, atraumatic; sclera clear, conjunctiva pink; hearing intact; oropharynx clear; neck supple  Lymph- no cervical lymphadenopathy Lungs- Clear to ausculation bilaterally, normal work of breathing.  No wheezes, rales, rhonchi Heart- Regular rate and rhythm, 2/6 SEM, rubs or gallops  GI- soft, non-tender, non-distended, bowel sounds present  Extremities- no  clubbing, cyanosis, or edema; DP/PT/radial pulses 1+ bilaterally, +right groin hematoma/no bruit MS- no significant deformity or atrophy Skin- warm and dry, no rash or lesion; ICD pocket well healed Psych- euthymic mood, full affect Neuro- strength and sensation are intact  ICD interrogation- reviewed in detail today,  See PACEART report  Recent Labs: 05/19/2014: Magnesium 2.5; TSH 12.116* 06/29/2014: ALT 93* 08/01/2014: Hemoglobin 12.4; Platelets 210.0 08/05/2014: BUN 26*; Creatinine 2.18*; Potassium 3.9; Sodium 140   Wt Readings from Last 3 Encounters:  08/17/14 192 lb (87.091 kg)  08/04/14 181 lb (82.101 kg)  07/18/14 184 lb 12.8 oz (83.825 kg)  Other studies Reviewed: Additional studies/ records that were reviewed today include: [facility] records    Assessment and Plan:  1.  Ventricular tachycardia S/p repeat VT ablation 07/2014 Device interrogation today demonstrates 2 treated episodes of VT terminated with ATP and 12 episodes of NSVT, some hovering around detection zone and not being treated.  Will decrease VT zone today to allow treatment of slower VT's Continue amiodarone 200mg  daily - she is intolerant of higher doses No driving X6 months (pt aware)  2.  Right groin hematoma Will check ultrasound today  3.  Chronic systolic dysfunction She has some fluid on board today.  Will need to readdress diuretics after repeat labs - BMET today Stable on an appropriate medical regimen Normal ICD function See Pace Art report No changes today  4.  CAD/ICM Catheterization 04/2014 with moderate CAD but no targets for revascularization.  Continue medical therapy   5.  HTN Stable No change required today  5.  CKD Improved with last labs off of Spirnolactone/Lisinopril Recheck BMET today   Current medicines are reviewed at length with the patient today.   The patient does not have concerns regarding her medicines.  The following changes were made today:   none   Disposition:   Follow up with Dr Rayann Heman 4 weeks   Signed, Chanetta Marshall, NP 08/17/2014 3:02 PM  [facility] [address] [place] [address] [phone] (office) [phone] (fax)

## 2014-08-17 ENCOUNTER — Other Ambulatory Visit: Payer: Self-pay | Admitting: Nurse Practitioner

## 2014-08-17 ENCOUNTER — Ambulatory Visit (HOSPITAL_COMMUNITY): Payer: Medicare Other | Attending: Nurse Practitioner

## 2014-08-17 ENCOUNTER — Encounter: Payer: Self-pay | Admitting: Internal Medicine

## 2014-08-17 ENCOUNTER — Other Ambulatory Visit: Payer: Self-pay | Admitting: Internal Medicine

## 2014-08-17 ENCOUNTER — Ambulatory Visit (INDEPENDENT_AMBULATORY_CARE_PROVIDER_SITE_OTHER): Payer: Medicare Other | Admitting: Nurse Practitioner

## 2014-08-17 VITALS — BP 124/68 | Ht 65.0 in | Wt 192.0 lb

## 2014-08-17 DIAGNOSIS — I472 Ventricular tachycardia, unspecified: Secondary | ICD-10-CM

## 2014-08-17 DIAGNOSIS — R1031 Right lower quadrant pain: Secondary | ICD-10-CM | POA: Insufficient documentation

## 2014-08-17 DIAGNOSIS — T148XXA Other injury of unspecified body region, initial encounter: Secondary | ICD-10-CM

## 2014-08-17 DIAGNOSIS — N183 Chronic kidney disease, stage 3 unspecified: Secondary | ICD-10-CM

## 2014-08-17 DIAGNOSIS — I519 Heart disease, unspecified: Secondary | ICD-10-CM | POA: Diagnosis not present

## 2014-08-17 DIAGNOSIS — I255 Ischemic cardiomyopathy: Secondary | ICD-10-CM

## 2014-08-17 DIAGNOSIS — I10 Essential (primary) hypertension: Secondary | ICD-10-CM

## 2014-08-17 DIAGNOSIS — T148 Other injury of unspecified body region: Secondary | ICD-10-CM

## 2014-08-17 LAB — CUP PACEART INCLINIC DEVICE CHECK
Date Time Interrogation Session: 20160605112135
Lead Channel Setting Pacing Amplitude: 2 V
Lead Channel Setting Pacing Pulse Width: 0.4 ms
MDC IDC SET LEADCHNL RV PACING AMPLITUDE: 2.5 V
MDC IDC SET LEADCHNL RV SENSING SENSITIVITY: 0.3 mV
MDC IDC SET ZONE DETECTION INTERVAL: 280 ms
MDC IDC SET ZONE DETECTION INTERVAL: 350 ms
MDC IDC SET ZONE DETECTION INTERVAL: 480 ms
MDC IDC SET ZONE DETECTION INTERVAL: 510 ms
Zone Setting Detection Interval: 270 ms

## 2014-08-17 NOTE — Patient Instructions (Addendum)
Medication Instructions:   Your physician recommends that you continue on your current medications as directed. Please refer to the Current Medication list given to you today.  Labwork:  BMET TODAY   BMET AGAIN IN ONE WEEK   Testing/Procedures:   Follow-Up:  DR Rayann Heman IN 4 WEEKS    Any Other Special Instructions Will Be Listed Below (If Applicable).

## 2014-08-18 ENCOUNTER — Other Ambulatory Visit: Payer: Self-pay | Admitting: *Deleted

## 2014-08-18 LAB — BASIC METABOLIC PANEL
BUN: 27 mg/dL — ABNORMAL HIGH (ref 6–23)
CHLORIDE: 104 meq/L (ref 96–112)
CO2: 27 meq/L (ref 19–32)
CREATININE: 2.01 mg/dL — AB (ref 0.40–1.20)
Calcium: 9.2 mg/dL (ref 8.4–10.5)
GFR: 32.2 mL/min — ABNORMAL LOW (ref >60.00)
GLUCOSE: 67 mg/dL — AB (ref 70–99)
Potassium: 4.1 mEq/L (ref 3.5–5.1)
Sodium: 138 mEq/L (ref 135–145)

## 2014-08-18 MED ORDER — FUROSEMIDE 40 MG PO TABS: 40.0000 mg | ORAL_TABLET | Freq: Every day | ORAL | Status: DC

## 2014-08-24 ENCOUNTER — Other Ambulatory Visit (INDEPENDENT_AMBULATORY_CARE_PROVIDER_SITE_OTHER): Payer: Medicare Other | Admitting: *Deleted

## 2014-08-24 ENCOUNTER — Other Ambulatory Visit: Payer: Self-pay | Admitting: *Deleted

## 2014-08-24 DIAGNOSIS — I519 Heart disease, unspecified: Secondary | ICD-10-CM

## 2014-08-24 DIAGNOSIS — E876 Hypokalemia: Secondary | ICD-10-CM

## 2014-08-24 LAB — BASIC METABOLIC PANEL
BUN: 27 mg/dL — AB (ref 6–23)
CO2: 31 mEq/L (ref 19–32)
CREATININE: 2.16 mg/dL — AB (ref 0.40–1.20)
Calcium: 9.5 mg/dL (ref 8.4–10.5)
Chloride: 102 mEq/L (ref 96–112)
GFR: 29.63 mL/min — ABNORMAL LOW (ref >60.00)
Glucose, Bld: 77 mg/dL (ref 70–99)
Potassium: 3.3 mEq/L — ABNORMAL LOW (ref 3.5–5.1)
Sodium: 138 mEq/L (ref 135–145)

## 2014-09-12 ENCOUNTER — Encounter: Payer: Self-pay | Admitting: Internal Medicine

## 2014-09-12 ENCOUNTER — Other Ambulatory Visit (INDEPENDENT_AMBULATORY_CARE_PROVIDER_SITE_OTHER): Payer: Medicare Other | Admitting: *Deleted

## 2014-09-12 ENCOUNTER — Other Ambulatory Visit: Payer: Medicare Other

## 2014-09-12 ENCOUNTER — Ambulatory Visit (INDEPENDENT_AMBULATORY_CARE_PROVIDER_SITE_OTHER): Payer: Medicare Other | Admitting: Internal Medicine

## 2014-09-12 VITALS — BP 116/78 | HR 77 | Ht 65.0 in | Wt 188.8 lb

## 2014-09-12 DIAGNOSIS — N183 Chronic kidney disease, stage 3 unspecified: Secondary | ICD-10-CM

## 2014-09-12 DIAGNOSIS — I472 Ventricular tachycardia, unspecified: Secondary | ICD-10-CM

## 2014-09-12 DIAGNOSIS — I255 Ischemic cardiomyopathy: Secondary | ICD-10-CM

## 2014-09-12 DIAGNOSIS — I509 Heart failure, unspecified: Secondary | ICD-10-CM

## 2014-09-12 DIAGNOSIS — E876 Hypokalemia: Secondary | ICD-10-CM | POA: Diagnosis not present

## 2014-09-12 LAB — CUP PACEART INCLINIC DEVICE CHECK
Brady Statistic AP VP Percent: 0.07 %
Brady Statistic AP VS Percent: 97.64 %
Brady Statistic AS VP Percent: 0 %
Brady Statistic RA Percent Paced: 97.72 %
Brady Statistic RV Percent Paced: 0.08 %
Date Time Interrogation Session: 20160620110155
HIGH POWER IMPEDANCE MEASURED VALUE: 190 Ohm
HIGH POWER IMPEDANCE MEASURED VALUE: 342 Ohm
HighPow Impedance: 61 Ohm
Lead Channel Impedance Value: 475 Ohm
Lead Channel Pacing Threshold Amplitude: 0.75 V
Lead Channel Pacing Threshold Amplitude: 0.75 V
Lead Channel Pacing Threshold Pulse Width: 0.4 ms
Lead Channel Sensing Intrinsic Amplitude: 10.25 mV
Lead Channel Setting Pacing Amplitude: 2 V
Lead Channel Setting Pacing Amplitude: 2.5 V
Lead Channel Setting Pacing Pulse Width: 0.4 ms
MDC IDC MSMT BATTERY VOLTAGE: 3.01 V
MDC IDC MSMT LEADCHNL RA PACING THRESHOLD PULSEWIDTH: 0.4 ms
MDC IDC MSMT LEADCHNL RA SENSING INTR AMPL: 2.375 mV
MDC IDC MSMT LEADCHNL RV IMPEDANCE VALUE: 418 Ohm
MDC IDC SET LEADCHNL RV SENSING SENSITIVITY: 0.3 mV
MDC IDC SET ZONE DETECTION INTERVAL: 280 ms
MDC IDC SET ZONE DETECTION INTERVAL: 550 ms
MDC IDC STAT BRADY AS VS PERCENT: 2.28 %
Zone Setting Detection Interval: 270 ms
Zone Setting Detection Interval: 350 ms
Zone Setting Detection Interval: 510 ms

## 2014-09-12 LAB — BASIC METABOLIC PANEL
BUN: 25 mg/dL — ABNORMAL HIGH (ref 6–23)
CALCIUM: 9.8 mg/dL (ref 8.4–10.5)
CO2: 32 mEq/L (ref 19–32)
Chloride: 102 mEq/L (ref 96–112)
Creatinine, Ser: 1.94 mg/dL — ABNORMAL HIGH (ref 0.40–1.20)
GFR: 33.53 mL/min — ABNORMAL LOW (ref >60.00)
Glucose, Bld: 125 mg/dL — ABNORMAL HIGH (ref 70–99)
POTASSIUM: 3.5 meq/L (ref 3.5–5.1)
Sodium: 140 mEq/L (ref 135–145)

## 2014-09-12 NOTE — Patient Instructions (Signed)
Medication Instructions:  Your physician recommends that you continue on your current medications as directed. Please refer to the Current Medication list given to you today.   Labwork: None ordered  Testing/Procedures: None ordered  Follow-Up: Your physician recommends that you schedule a follow-up appointment in: 3 months with Amber Seiler, NP   Any Other Special Instructions Will Be Listed Below (If Applicable).   

## 2014-09-13 NOTE — Progress Notes (Signed)
Electrophysiology Office Note   Date:  09/13/2014   ID:  [name], DOB 04-07-1950, MRN [id number]  PCP:  Bronson Curb, PA-C  Cardiologist:  Dr Johnsie Cancel Primary Electrophysiologist: Thompson Grayer, MD    Chief Complaint  Patient presents with  . VT     History of Present Illness: [name] is a 64 y.o. female who presents today for electrophysiology evaluation.  She has done well since her recent VT ablation. She denies procedure related complications. Today, she denies symptoms of palpitations, chest pain, shortness of breath, orthopnea, PND, lower extremity edema, claudication, dizziness, presyncope, syncope, bleeding, or neurologic sequela. The patient is tolerating medications without difficulties and is otherwise without complaint today.    Past Medical History  Diagnosis Date  . Ischemic cardiomyopathy     a. s/p MDT dual chamber ICD implanted 2013 by Dr Westley Gambles at [facility], now followed by Dr Rayann Heman  . History of stroke      a. 1993  . Coronary artery disease     a. s/p previous interventions in [place] per patient, no records available b. cath 04/2013 with non-obstructive disease   . Lymphoma     a. s/p chemo/radiation 2009  . CHF (congestive heart failure)   . HTN (hypertension)   . HLD (hyperlipidemia)   . Ventricular tachycardia     a. CL 300 msec requiring ICD shocks therpay 5/14 b. recurrent VT 04/2014, placed on amiodarone c. recurrent VT 05/2014 s/p ablation by Dr Rayann Heman d. s/p repeat ablation 07/2014  . DM2 (diabetes mellitus, type 2), newly diagnosed    . CKD (chronic kidney disease) stage 3, GFR 30-59 ml/min    . Polymyalgia rheumatica   . Depression   . GERD (gastroesophageal reflux disease)    Past Surgical History  Procedure Laterality Date  . Cardiac defibrillator placement  03/2011    MDT ICD implanted at [facility] by Dr Tana Coast  . Coronary angioplasty with stent placement    . Left and right heart catheterization with coronary angiogram N/A  05/03/2013    non-obstructive CAD  . V-tach ablation N/A 05/26/2014    VT ablation by Dr Rayann Heman - PVCs arising from the inferolateral LV, extensive substrate ablation  . Electrophysiologic study N/A 08/04/2014    Procedure: V Tach Ablation;  Surgeon: Thompson Grayer, MD;  Location: [place] [facility];  Service: Cardiovascular;  Laterality: N/A;     Current Outpatient Prescriptions  Medication Sig Dispense Refill  . amiodarone (PACERONE) 200 MG tablet Take 1 tablet (200 mg total) by mouth daily. 30 tablet 1  . amLODipine (NORVASC) 10 MG tablet Take 10 mg by mouth daily.    Marland Kitchen aspirin EC 81 MG tablet Take 81 mg by mouth daily.    Marland Kitchen atorvastatin (LIPITOR) 40 MG tablet Take 1 tablet (40 mg total) by mouth daily at 6 PM. 90 tablet 30  . clopidogrel (PLAVIX) 75 MG tablet Take 75 mg by mouth at bedtime.     Marland Kitchen ezetimibe (ZETIA) 10 MG tablet Take 10 mg by mouth at bedtime.     . furosemide (LASIX) 40 MG tablet Take 1 tablet (40 mg total) by mouth daily. 90 tablet 3  . glipiZIDE (GLUCOTROL XL) 2.5 MG 24 hr tablet Take 2.5 mg by mouth daily with breakfast.    . isosorbide mononitrate (IMDUR) 30 MG 24 hr tablet Take 30 mg by mouth daily.    . metoprolol succinate (TOPROL-XL) 50 MG 24 hr tablet Take 1 tablet (50 mg  total) by mouth 2 (two) times daily. Take with or immediately following a meal. 180 tablet 0  . nitroGLYCERIN (NITROSTAT) 0.4 MG SL tablet Place 1 tablet (0.4 mg total) under the tongue every 5 (five) minutes as needed for chest pain. (Patient taking differently: Place 0.4 mg under the tongue every 5 (five) minutes as needed for chest pain (MAX 3 TABLETS). ) 25 tablet 3  . potassium chloride (K-DUR,KLOR-CON) 10 MEQ tablet Take 10 mEq by mouth every morning.      No current facility-administered medications for this visit.    Allergies:   Flagyl; Contrast media; Ioxaglate; Potassium sulfate; and Potassium-containing compounds   Social History:  The patient  reports that she quit smoking about 3  months ago. She has never used smokeless tobacco. She reports that she does not drink alcohol or use illicit drugs.   Family History:  The patient's family history includes Alcoholism in her father and sister; Breast cancer in her mother; Diabetes in her brother, brother, brother, brother, and father; Heart attack in her brother and sister; Heart disease in her father, mother, and sister; Hypertension in her father, mother, and sister; Stroke in her brother and mother; Thyroid disease in her sister.    ROS:  Please see the history of present illness.   All other systems are reviewed and negative.    PHYSICAL EXAM: VS:  BP 116/78 mmHg  Pulse 77  Ht 5\' 5"  (1.651 m)  Wt 85.639 kg (188 lb 12.8 oz)  BMI 31.42 kg/m2 , BMI Body mass index is 31.42 kg/(m^2). GEN: Well nourished, well developed, in no acute distress HEENT: normal Neck: no JVD, carotid bruits, or masses Cardiac: RRR; no murmurs, rubs, or gallops,no edema  Respiratory:  clear to auscultation bilaterally, normal work of breathing GI: soft, nontender, nondistended, + BS MS: no deformity or atrophy Skin: warm and dry, device pocket is well healed Neuro:  Strength and sensation are intact Psych: euthymic mood, full affect  Device interrogation is reviewed today in detail.  See PaceArt for details.   Recent Labs: 05/19/2014: Magnesium 2.5; TSH 12.116* 06/29/2014: ALT 93* 08/01/2014: Hemoglobin 12.4; Platelets 210.0 09/12/2014: BUN 25*; Creatinine, Ser 1.94*; Potassium 3.5; Sodium 140    Lipid Panel     Component Value Date/Time   CHOL 123 05/20/2014 0319   TRIG 50 05/20/2014 0319   HDL 44 05/20/2014 0319   CHOLHDL 2.8 05/20/2014 0319   VLDL 10 05/20/2014 0319   LDLCALC 69 05/20/2014 0319     Wt Readings from Last 3 Encounters:  09/12/14 85.639 kg (188 lb 12.8 oz)  08/17/14 87.091 kg (192 lb)  08/04/14 82.101 kg (181 lb)      ASSESSMENT AND PLAN:  1.  VT Better controlled  Normal ICD function See Pace Art  report No changes today Interrogation reveals VT on 08/17/14 with CL 470 msec successfully ATP terminated No other episodes which represents a significant reduce in VT burdon. No driving (pt is aware)  2. Chronic systolic dysfunction Stable No change required today  3. HTN Stable No change required today  4. CRI Oral hydration is encouraged  Current medicines are reviewed at length with the patient today.   The patient does not have concerns regarding her medicines.  The following changes were made today:  none  Labs/ tests ordered today include:  Orders Placed This Encounter  Procedures  . Implantable device check    Follow-up with Chanetta Marshall NP in 3 months [facility]  Signed, Thompson Grayer,  MD  09/13/2014 9:36 PM     [place] [address] [phone] (office) [phone] (fax)

## 2014-11-30 ENCOUNTER — Ambulatory Visit (HOSPITAL_COMMUNITY): Payer: Medicare Other | Admitting: Nurse Practitioner

## 2014-11-30 ENCOUNTER — Encounter: Payer: Medicare Other | Admitting: Nurse Practitioner

## 2014-11-30 NOTE — Progress Notes (Signed)
This encounter was created in error - please disregard.

## 2014-12-02 ENCOUNTER — Encounter: Payer: Self-pay | Admitting: *Deleted

## 2014-12-20 NOTE — Progress Notes (Signed)
Electrophysiology Office Note Date: 12/21/2014  ID:  [name], DOB 1951-03-04, MRN [id number]  PCP: Bronson Curb, PA-C Primary Cardiologist: Johnsie Cancel Electrophysiologist: Allred  CC: VT follow up  [name] is a 64 y.o. female seen today for Dr. Rayann Heman.  She was admitted 04/2014 with ventricular tachycardia.  Since then she has undergone VT ablation x2 and been maintained on amiodarone. She presents today for routine EP follow up.  Since last being seen in our clinic, she reports doing reasonably well. She denies PND, orthopnea, syncope, or early satiety.  She has occasional "hard heart beats" that last for a couple of hours and are associated with increased shortness of breath.   She has not had further ICD therapy.  She has had weight gain/shortness of breath with exertion and reports relative compliance with low sodium diet although she fries all her foods.   Device History: MDT dual chamber ICD implanted 2013 for ischemic cardiomyopathy by Dr Westley Gambles at [facility] History of appropriate therapy: yes History of AAD therapy: yes, amiodarone initiated 04/2014 for VT, s/p ablation 05/2014 Dr Rayann Heman, ablation 07/2014 Dr Rayann Heman   Past Medical History  Diagnosis Date  . Ischemic cardiomyopathy     a. s/p MDT dual chamber ICD implanted 2013 by Dr Westley Gambles at [facility], now followed by Dr Rayann Heman  . History of stroke      a. 1993  . Coronary artery disease     a. s/p previous interventions in [place] per patient, no records available b. cath 04/2013 with non-obstructive disease   . Lymphoma     a. s/p chemo/radiation 2009  . CHF (congestive heart failure)   . HTN (hypertension)   . HLD (hyperlipidemia)   . Ventricular tachycardia     a. CL 300 msec requiring ICD shocks therpay 5/14 b. recurrent VT 04/2014, placed on amiodarone c. recurrent VT 05/2014 s/p ablation by Dr Rayann Heman d. s/p repeat ablation 07/2014  . DM2 (diabetes mellitus, type 2), newly diagnosed    . CKD (chronic kidney  disease) stage 3, GFR 30-59 ml/min    . Polymyalgia rheumatica   . Depression   . GERD (gastroesophageal reflux disease)    Past Surgical History  Procedure Laterality Date  . Cardiac defibrillator placement  03/2011    MDT ICD implanted at [facility] by Dr Tana Coast  . Coronary angioplasty with stent placement    . Left and right heart catheterization with coronary angiogram N/A 05/03/2013    non-obstructive CAD  . V-tach ablation N/A 05/26/2014    VT ablation by Dr Rayann Heman - PVCs arising from the inferolateral LV, extensive substrate ablation  . Electrophysiologic study N/A 08/04/2014    Procedure: V Tach Ablation;  Surgeon: Thompson Grayer, MD;  Location: [place] [facility];  Service: Cardiovascular;  Laterality: N/A;    Current Outpatient Prescriptions  Medication Sig Dispense Refill  . Multiple Vitamin (MULTI VITAMIN DAILY PO) Take 1 tablet by mouth daily.    Marland Kitchen amiodarone (PACERONE) 200 MG tablet Take 1 tablet (200 mg total) by mouth daily. 30 tablet 1  . amLODipine (NORVASC) 5 MG tablet Take 5 mg by mouth daily.     Marland Kitchen aspirin EC 81 MG tablet Take 81 mg by mouth daily.    Marland Kitchen atorvastatin (LIPITOR) 40 MG tablet Take 1 tablet (40 mg total) by mouth daily at 6 PM. 90 tablet 30  . clopidogrel (PLAVIX) 75 MG tablet Take 75 mg by mouth at bedtime.     Marland Kitchen  ezetimibe (ZETIA) 10 MG tablet Take 10 mg by mouth at bedtime.     . furosemide (LASIX) 40 MG tablet Take 1 tablet (40 mg total) by mouth daily. 90 tablet 3  . glipiZIDE (GLUCOTROL XL) 2.5 MG 24 hr tablet Take 2.5 mg by mouth daily with breakfast.    . isosorbide mononitrate (IMDUR) 30 MG 24 hr tablet Take 30 mg by mouth daily.    . metoprolol succinate (TOPROL-XL) 50 MG 24 hr tablet Take 1 tablet (50 mg total) by mouth 2 (two) times daily. Take with or immediately following a meal. 180 tablet 0  . nitroGLYCERIN (NITROSTAT) 0.4 MG SL tablet Place 1 tablet (0.4 mg total) under the tongue every 5 (five) minutes as needed for chest pain. 25 tablet 3  .  potassium chloride (K-DUR,KLOR-CON) 10 MEQ tablet Take 15 mEq by mouth every morning.      No current facility-administered medications for this visit.    Allergies:   Flagyl; Contrast media; Ioxaglate; Potassium sulfate; and Potassium-containing compounds   Social History: Social History   Social History  . Marital Status: Single    Spouse Name: N/A  . Number of Children: 2  . Years of Education: N/A   Occupational History  . Not on file.   Social History Main Topics  . Smoking status: Former Smoker -- 0.25 packs/day    Quit date: 05/20/2014  . Smokeless tobacco: Never Used     Comment: she is not ready to quit  . Alcohol Use: No  . Drug Use: No  . Sexual Activity: Not on file   Other Topics Concern  . Not on file   Social History Narrative    Family History: Family History  Problem Relation Age of Onset  . Diabetes Father   . Hypertension Father   . Heart disease Father   . Alcoholism Father   . Hypertension Mother   . Heart disease Mother   . Breast cancer Mother   . Stroke Mother   . Diabetes Brother   . Diabetes Brother   . Diabetes Brother   . Diabetes Brother   . Hypertension Sister   . Heart disease Sister   . Alcoholism Sister   . Thyroid disease Sister   . Heart attack Sister   . Heart attack Brother   . Stroke Brother     Review of Systems: All other systems reviewed and are otherwise negative except as noted above.   Physical Exam: VS:  BP 130/88 mmHg  Ht 5\' 5"  (1.651 m)  Wt 198 lb 6.4 oz (89.994 kg)  BMI 33.02 kg/m2 , BMI Body mass index is 33.02 kg/(m^2).  GEN- The patient is elderly, obese appearing, alert and oriented x 3 today.   HEENT: normocephalic, atraumatic; sclera clear, conjunctiva pink; hearing intact; oropharynx clear; neck supple  Lungs- Clear to ausculation bilaterally, normal work of breathing.  No wheezes, rales, rhonchi Heart- Regular rate and rhythm, 2/6 SEM, rubs or gallops  GI- soft, non-tender, non-distended,  bowel sounds present  Extremities- no clubbing, cyanosis, or edema; DP/PT/radial pulses 1+ bilaterally  MS- no significant deformity or atrophy Skin- warm and dry, no rash or lesion; ICD pocket well healed Psych- euthymic mood, full affect Neuro- strength and sensation are intact  ICD interrogation- reviewed in detail today,  See PACEART report  Recent Labs: 05/19/2014: Magnesium 2.5; TSH 12.116* 06/29/2014: ALT 93* 08/01/2014: Hemoglobin 12.4; Platelets 210.0 09/12/2014: BUN 25*; Creatinine, Ser 1.94*; Potassium 3.5; Sodium 140   Wt  Readings from Last 3 Encounters:  12/21/14 198 lb 6.4 oz (89.994 kg)  09/12/14 188 lb 12.8 oz (85.639 kg)  08/17/14 192 lb (87.091 kg)     Other studies Reviewed: Additional studies/ records that were reviewed today include: Dr Jackalyn Lombard last office notes   Assessment and Plan:  1.  Ventricular tachycardia S/p repeat VT ablation 07/2014 No further VT since last office visit Continue amiodarone 200mg  daily - she is intolerant of higher doses No driving X6 months (pt aware)  2.  Chronic systolic dysfunction She has some fluid on board today.  Will need to readdress diuretics after repeat labs - BMET today Stable on an appropriate medical regimen Normal ICD function See Claudia Desanctis Art report No changes today  3.  CAD/ICM Catheterization 04/2014 with moderate CAD but no targets for revascularization.  Continue medical therapy   4.  HTN Stable No change required today  5.  CKD Recheck BMET today   Current medicines are reviewed at length with the patient today.   The patient does not have concerns regarding her medicines.  The following changes were made today:  none   Disposition:   Follow up with [facility], me in 6 months   Signed, Chanetta Marshall, NP 12/21/2014 1:57 PM  [place] [address] [phone] (office) [phone] (fax)

## 2014-12-21 ENCOUNTER — Encounter: Payer: Self-pay | Admitting: Internal Medicine

## 2014-12-21 ENCOUNTER — Ambulatory Visit: Payer: Self-pay

## 2014-12-21 ENCOUNTER — Ambulatory Visit (INDEPENDENT_AMBULATORY_CARE_PROVIDER_SITE_OTHER): Payer: Medicare Other | Admitting: Nurse Practitioner

## 2014-12-21 ENCOUNTER — Encounter: Payer: Self-pay | Admitting: Nurse Practitioner

## 2014-12-21 VITALS — BP 130/88 | Ht 65.0 in | Wt 198.4 lb

## 2014-12-21 DIAGNOSIS — I10 Essential (primary) hypertension: Secondary | ICD-10-CM | POA: Diagnosis not present

## 2014-12-21 DIAGNOSIS — I255 Ischemic cardiomyopathy: Secondary | ICD-10-CM

## 2014-12-21 DIAGNOSIS — N183 Chronic kidney disease, stage 3 unspecified: Secondary | ICD-10-CM

## 2014-12-21 DIAGNOSIS — I472 Ventricular tachycardia, unspecified: Secondary | ICD-10-CM

## 2014-12-21 DIAGNOSIS — I5022 Chronic systolic (congestive) heart failure: Secondary | ICD-10-CM

## 2014-12-21 LAB — CUP PACEART INCLINIC DEVICE CHECK
Brady Statistic RA Percent Paced: 96.3 %
Brady Statistic RV Percent Paced: 0.1 %
HIGH POWER IMPEDANCE MEASURED VALUE: 66 Ohm
Lead Channel Impedance Value: 342 Ohm
Lead Channel Impedance Value: 456 Ohm
Lead Channel Pacing Threshold Amplitude: 0.875 V
Lead Channel Sensing Intrinsic Amplitude: 2.8 mV
Lead Channel Sensing Intrinsic Amplitude: 6.1 mV
Lead Channel Setting Pacing Amplitude: 2.5 V
MDC IDC MSMT BATTERY VOLTAGE: 3.01 V
MDC IDC MSMT LEADCHNL RA PACING THRESHOLD PULSEWIDTH: 0.4 ms
MDC IDC MSMT LEADCHNL RV PACING THRESHOLD AMPLITUDE: 1 V
MDC IDC MSMT LEADCHNL RV PACING THRESHOLD PULSEWIDTH: 0.4 ms
MDC IDC SESS DTM: 20160928141616
MDC IDC SET LEADCHNL RA PACING AMPLITUDE: 2 V
MDC IDC SET LEADCHNL RV PACING PULSEWIDTH: 0.4 ms
MDC IDC SET LEADCHNL RV SENSING SENSITIVITY: 0.3 mV
MDC IDC SET ZONE DETECTION INTERVAL: 350 ms
MDC IDC SET ZONE DETECTION INTERVAL: 550 ms
Zone Setting Detection Interval: 270 ms
Zone Setting Detection Interval: 280 ms
Zone Setting Detection Interval: 510 ms

## 2014-12-21 LAB — BASIC METABOLIC PANEL
BUN: 28 mg/dL — ABNORMAL HIGH (ref 6–23)
CALCIUM: 9.5 mg/dL (ref 8.4–10.5)
CO2: 30 mEq/L (ref 19–32)
Chloride: 104 mEq/L (ref 96–112)
Creatinine, Ser: 2.1 mg/dL — ABNORMAL HIGH (ref 0.40–1.20)
GFR: 30.57 mL/min — AB (ref >60.00)
Glucose, Bld: 75 mg/dL (ref 70–99)
Potassium: 3.9 mEq/L (ref 3.5–5.1)
SODIUM: 140 meq/L (ref 135–145)

## 2014-12-21 NOTE — Patient Instructions (Signed)
Medication Instructions:  Your physician recommends that you continue on your current medications as directed. Please refer to the Current Medication list given to you today.    Labwork:  BMET TODAY    Testing/Procedures:  NONE ORDER TODAY     Follow-Up:  Remote monitoring is used to monitor your Pacemaker of ICD from home. This monitoring reduces the number of office visits required to check your device to one time per year. It allows [place] to keep an eye on the functioning of your device to ensure it is working properly. You are scheduled for a device check from home on .12 / 29/16 You may send your transmission at any time that day. If you have a wireless device, the transmission will be sent automatically. After your physician reviews your transmission, you will receive a postcard with your next transmission date.  Your physician wants you to follow-up in:  [place] receive a reminder letter in the mail two months in advance. If you don't receive a letter, please call our office to schedule the follow-up appointment.      Any Other Special Instructions Will Be Listed Below (If Applicable).

## 2014-12-23 ENCOUNTER — Telehealth: Payer: Self-pay

## 2014-12-23 DIAGNOSIS — Z79899 Other long term (current) drug therapy: Secondary | ICD-10-CM

## 2014-12-23 MED ORDER — METOLAZONE 2.5 MG PO TABS: 2.5000 mg | ORAL_TABLET | Freq: Every day | ORAL | Status: DC

## 2014-12-23 NOTE — Telephone Encounter (Signed)
Called patient about lab results. Per Chanetta Marshall NP, please notify patient of lab results. Please ask her to add Zaroxyln 2.5mg  1 tablet WEEKLY 30 minutes prior to lasix. Will need repeat BMET 1 week after starting. Please ask her to continue to track daily weights and reinforce low sodium diet. Patient verbalized understanding.

## 2014-12-26 ENCOUNTER — Encounter: Payer: Self-pay | Admitting: Urology

## 2014-12-26 ENCOUNTER — Ambulatory Visit (INDEPENDENT_AMBULATORY_CARE_PROVIDER_SITE_OTHER): Payer: Medicare Other | Admitting: Urology

## 2014-12-26 VITALS — BP 109/78 | HR 91 | Ht 65.0 in | Wt 194.8 lb

## 2014-12-26 DIAGNOSIS — N183 Chronic kidney disease, stage 3 unspecified: Secondary | ICD-10-CM

## 2014-12-26 DIAGNOSIS — I255 Ischemic cardiomyopathy: Secondary | ICD-10-CM | POA: Diagnosis not present

## 2014-12-26 DIAGNOSIS — N2889 Other specified disorders of kidney and ureter: Secondary | ICD-10-CM | POA: Diagnosis not present

## 2014-12-26 LAB — MICROSCOPIC EXAMINATION: RBC MICROSCOPIC, UA: NONE SEEN /HPF (ref 0–?)

## 2014-12-26 LAB — URINALYSIS, COMPLETE
Bilirubin, UA: NEGATIVE
Glucose, UA: NEGATIVE
KETONES UA: NEGATIVE
Leukocytes, UA: NEGATIVE
NITRITE UA: NEGATIVE
PH UA: 6 (ref 5.0–7.5)
RBC UA: NEGATIVE
SPEC GRAV UA: 1.015 (ref 1.005–1.030)
Urobilinogen, Ur: 0.2 mg/dL (ref 0.2–1.0)

## 2014-12-26 NOTE — Progress Notes (Signed)
12/26/2014 10:13 AM   [name] Terry 1950/08/09 [id number]  Referring provider: Bronson Curb, PA-C [address] [place], [place] [address]  Chief Complaint  Patient presents with  . Renal Mass    New Patient    HPI: [name] Terry is a 64yo with a hx of CKD3 who is here for evaluation of renal masses. She was followed by a nephrologist in [facility] for over 5 years but she does not want to see that nephrologist any longer. She has a hx of CAD with stents. She has renal stents. She has a pacemaker. She had a CT non contrast in 05/2014 which showed bilateral renal masses, probably cysts.    She denies gross hematuria. She has a feeling of incomplete emptying. She has urgency but no urge incontinence. She is not bothered by these LUTS. She has nocturia 3-4x. She takes metolazone which is the cause of her nocturia.     PMH: Past Medical History  Diagnosis Date  . Ischemic cardiomyopathy     a. s/p MDT dual chamber ICD implanted 2013 by Dr Westley Gambles at [facility], now followed by Dr Rayann Heman  . History of stroke      a. 1993  . Coronary artery disease     a. s/p previous interventions in [place] per patient, no records available b. cath 04/2013 with non-obstructive disease   . Lymphoma ([place])     a. s/p chemo/radiation 2009  . CHF (congestive heart failure) ([place])   . HTN (hypertension)   . HLD (hyperlipidemia)   . Ventricular tachycardia ([place])     a. CL 300 msec requiring ICD shocks therpay 5/14 b. recurrent VT 04/2014, placed on amiodarone c. recurrent VT 05/2014 s/p ablation by Dr Rayann Heman d. s/p repeat ablation 07/2014  . DM2 (diabetes mellitus, type 2), newly diagnosed    . CKD (chronic kidney disease) stage 3, GFR 30-59 ml/min    . Polymyalgia rheumatica ([place])   . Depression   . GERD (gastroesophageal reflux disease)     Surgical History: Past Surgical History  Procedure Laterality Date  . Cardiac defibrillator placement  03/2011    MDT ICD implanted at [facility] by Dr Tana [place]  .  Coronary angioplasty with stent placement    . Left and right heart catheterization with coronary angiogram N/A 05/03/2013    non-obstructive CAD  . V-tach ablation N/A 05/26/2014    VT ablation by Dr Rayann Heman - PVCs arising from the inferolateral LV, extensive substrate ablation  . Electrophysiologic study N/A 08/04/2014    Procedure: V Tach Ablation;  Surgeon: Thompson Grayer, MD;  Location: [facility];  Service: Cardiovascular;  Laterality: N/A;    Home Medications:    Medication List       This list is accurate as of: 12/26/14 10:13 AM.  Always use your most recent med list.               amiodarone 200 MG tablet  Commonly known as:  PACERONE  Take 1 tablet (200 mg total) by mouth daily.     amLODipine 5 MG tablet  Commonly known as:  NORVASC  Take 5 mg by mouth daily.     aspirin EC 81 MG tablet  Take 81 mg by mouth daily.     atorvastatin 40 MG tablet  Commonly known as:  LIPITOR  Take 1 tablet (40 mg total) by mouth daily at 6 PM.     clopidogrel 75 MG tablet  Commonly known  as:  PLAVIX  Take 75 mg by mouth at bedtime.     ezetimibe 10 MG tablet  Commonly known as:  ZETIA  Take 10 mg by mouth at bedtime.     furosemide 40 MG tablet  Commonly known as:  LASIX  Take 1 tablet (40 mg total) by mouth daily.     glipiZIDE 2.5 MG 24 hr tablet  Commonly known as:  GLUCOTROL XL  Take 2.5 mg by mouth daily with breakfast.     isosorbide mononitrate 30 MG 24 hr tablet  Commonly known as:  IMDUR  Take 30 mg by mouth daily.     metolazone 2.5 MG tablet  Commonly known as:  ZAROXOLYN  Take 1 tablet (2.5 mg total) by mouth daily.     metoprolol succinate 50 MG 24 hr tablet  Commonly known as:  TOPROL-XL  Take 1 tablet (50 mg total) by mouth 2 (two) times daily. Take with or immediately following a meal.     MULTI VITAMIN DAILY PO  Take 1 tablet by mouth daily.     nitroGLYCERIN 0.4 MG SL tablet  Commonly known as:  NITROSTAT  Place 1 tablet (0.4 mg total)  under the tongue every 5 (five) minutes as needed for chest pain.     potassium chloride 10 MEQ tablet  Commonly known as:  K-DUR,KLOR-CON  Take 15 mEq by mouth every morning.        Allergies:  Allergies  Allergen Reactions  . Flagyl [Metronidazole] Swelling and Rash    Face swells   . Contrast Media [Iodinated Diagnostic Agents] Rash  . Ioxaglate Rash  . Potassium Sulfate Rash and Other (See Comments)    Headaches  . Potassium-Containing Compounds Other (See Comments)    Headaches-- reports no problems now    Family History: Family History  Problem Relation Age of Onset  . Diabetes Father   . Hypertension Father   . Heart disease Father   . Alcoholism Father   . Hypertension Mother   . Heart disease Mother   . Breast cancer Mother   . Stroke Mother   . Diabetes Brother   . Diabetes Brother   . Diabetes Brother   . Diabetes Brother   . Hypertension Sister   . Heart disease Sister   . Alcoholism Sister   . Thyroid disease Sister   . Heart attack Sister   . Heart attack Brother   . Stroke Brother     Social History:  reports that she quit smoking about 7 months ago. She has never used smokeless tobacco. She reports that she does not drink alcohol or use illicit drugs.  ROS: UROLOGY Frequent Urination?: Yes Hard to postpone urination?: Yes Burning/pain with urination?: No Get up at night to urinate?: Yes Leakage of urine?: No Urine stream starts and stops?: No Trouble starting stream?: No Do you have to strain to urinate?: No Blood in urine?: No Urinary tract infection?: No Sexually transmitted disease?: No Injury to kidneys or bladder?: No Painful intercourse?: No Weak stream?: No Currently pregnant?: No Vaginal bleeding?: No Last menstrual period?: n  Gastrointestinal Nausea?: No Vomiting?: No Indigestion/heartburn?: No Diarrhea?: No Constipation?: No  Constitutional Fever: No Night sweats?: No Weight loss?: No Fatigue?: No  Skin Skin  rash/lesions?: Yes Itching?: Yes  Eyes Blurred vision?: Yes Double vision?: No  Ears/Nose/Throat Sore throat?: No Sinus problems?: No  Hematologic/Lymphatic Swollen glands?: No Easy bruising?: No  Cardiovascular Leg swelling?: Yes Chest pain?: Yes  Respiratory Cough?:  No Shortness of breath?: Yes  Endocrine Excessive thirst?: No  Musculoskeletal Back pain?: No Joint pain?: No  Neurological Headaches?: Yes Dizziness?: No  Psychologic Depression?: No Anxiety?: Yes  Physical Exam: BP 109/78 mmHg  Pulse 91  Ht 5\' 5"  (1.651 m)  Wt 88.361 kg (194 lb 12.8 oz)  BMI 32.42 kg/m2  Constitutional:  Alert and oriented, No acute distress. HEENT: [place] AT, moist mucus membranes.  Trachea midline, no masses. Cardiovascular: No clubbing, cyanosis, or edema. Respiratory: Normal respiratory effort, no increased work of breathing. GI: Abdomen is soft, nontender, nondistended, no abdominal masses GU: No CVA tenderness.  Skin: No rashes, bruises or suspicious lesions. Lymph: No cervical or inguinal adenopathy. Neurologic: Grossly intact, no focal deficits, moving all 4 extremities. Psychiatric: Normal mood and affect.  Laboratory Data: Lab Results  Component Value Date   WBC 6.0 08/01/2014   HGB 12.4 08/01/2014   HCT 37.1 08/01/2014   MCV 89.8 08/01/2014   PLT 210.0 08/01/2014    Lab Results  Component Value Date   CREATININE 2.10* 12/21/2014    No results found for: PSA  No results found for: TESTOSTERONE  Lab Results  Component Value Date   HGBA1C 5.9* 05/20/2014    Urinalysis    Component Value Date/Time   COLORURINE YELLOW 06/29/2014 2006   APPEARANCEUR CLEAR 06/29/2014 2006   LABSPEC 1.007 06/29/2014 2006   PHURINE 7.0 06/29/2014 2006   GLUCOSEU NEGATIVE 06/29/2014 2006   HGBUR NEGATIVE 06/29/2014 2006   [place] NEGATIVE 06/29/2014 2006   [place] NEGATIVE 06/29/2014 2006   PROTEINUR NEGATIVE 06/29/2014 2006   UROBILINOGEN 0.2 06/29/2014  2006   NITRITE NEGATIVE 06/29/2014 2006   LEUKOCYTESUR NEGATIVE 06/29/2014 2006    Pertinent Imaging: CT stone study  Assessment & Plan:    1. Renal mass - renal US, will call with results. If renal masses are cysts, followup PRN. If solid will reschedule appointment to discuss treatment - Urinalysis, Complete  2. CKD3 -referral to nephrology   No Follow-up on file.  Cleon Gustin, [facility] [address] [place], [place] [address] [phone]

## 2014-12-29 ENCOUNTER — Ambulatory Visit (INDEPENDENT_AMBULATORY_CARE_PROVIDER_SITE_OTHER): Payer: Medicare Other

## 2014-12-29 ENCOUNTER — Other Ambulatory Visit (INDEPENDENT_AMBULATORY_CARE_PROVIDER_SITE_OTHER): Payer: Medicare Other

## 2014-12-29 DIAGNOSIS — Z95810 Presence of automatic (implantable) cardiac defibrillator: Secondary | ICD-10-CM

## 2014-12-29 DIAGNOSIS — Z79899 Other long term (current) drug therapy: Secondary | ICD-10-CM

## 2014-12-29 DIAGNOSIS — I509 Heart failure, unspecified: Secondary | ICD-10-CM

## 2014-12-29 LAB — BASIC METABOLIC PANEL
BUN: 40 mg/dL — ABNORMAL HIGH (ref 7–25)
CO2: 34 mmol/L — AB (ref 20–31)
CREATININE: 2.51 mg/dL — AB (ref 0.50–0.99)
Calcium: 9.7 mg/dL (ref 8.6–10.4)
Chloride: 99 mmol/L (ref 98–110)
GLUCOSE: 86 mg/dL (ref 65–99)
Potassium: 3.2 mmol/L — ABNORMAL LOW (ref 3.5–5.3)
Sodium: 139 mmol/L (ref 135–146)

## 2014-12-29 NOTE — Progress Notes (Signed)
EPIC Encounter for ICM Monitoring  Patient Name: [name] Terry is a 64 y.o. female Date: 12/29/2014 Primary Care Physican: Mackey Birchwood Primary Cardiologist: Johnsie Cancel Electrophysiologist: Allred Dry Weight: 196 lb       In the past month, have you:  1. Gained more than 2 pounds in a day or more than 5 pounds in a week? no  2. Had changes in your medications (with verification of current medications)? Yes, addition of Metolazone 2.5 mg weekly, take 30 minutes prior to Lasix.  Amlodopine decreased to 5 mg and Potassium to 15 mg daily.  3. Had more shortness of breath than is usual for you? no  4. Limited your activity because of shortness of breath? no  5. Not been able to sleep because of shortness of breath? no  6. Had increased swelling in your feet or ankles? no  7. Had symptoms of dehydration (dizziness, dry mouth, increased thirst, decreased urine output) no  8. Had changes in sodium restriction? no  9. Been compliant with medication? Yes   ICM trend: 12/29/2014   Follow-up plan: ICM clinic phone appointment on 01/31/2015.  Optivol impedance above baseline since last transmission on 12/21/2014. Patient reported dizziness since 12/22/2014 and unsure if it is related to medication.  She was started on Metolazone weekly and took one dose on 12/24/2014.  She reported Metolazone has been effective and she has noticed less chest congestion and increase in urine output.  She has lost 2 lbs since Saturday.  Asked if she is drinking enough fluids since she is feeling dizziness and she stated probably not.  Advised to drink approximately 6-8 (8oz) cups of fluid a day unless the physician has limited her intake. She is waiting on lab results that were drawn today.  Advised if dizziness worsens to call the office.    Copy of note sent to patient's primary care physician, primary cardiologist, and device following physician.  Rosalene Billings, RN, CCM 12/29/2014 11:41 AM

## 2014-12-30 ENCOUNTER — Telehealth: Payer: Self-pay | Admitting: *Deleted

## 2014-12-30 ENCOUNTER — Other Ambulatory Visit: Payer: Self-pay | Admitting: *Deleted

## 2014-12-30 NOTE — Telephone Encounter (Signed)
-----   Message from Patsey Berthold, NP sent at 12/29/2014  3:06 PM EDT ----- Please call patient. Ask her to take an extra 22meq of potassium today.  Stop zaroxyln. Daily weights. Salt and fluid restriction. Call back with increased shortness of breath/edema. Thank you!

## 2015-01-03 ENCOUNTER — Ambulatory Visit
Admission: RE | Admit: 2015-01-03 | Discharge: 2015-01-03 | Disposition: A | Discharge status: Home / Self Care | Payer: Medicare Other | Source: Ambulatory Visit | Attending: Urology | Admitting: Urology

## 2015-01-03 DIAGNOSIS — N2889 Other specified disorders of kidney and ureter: Secondary | ICD-10-CM | POA: Insufficient documentation

## 2015-01-03 DIAGNOSIS — N281 Cyst of kidney, acquired: Secondary | ICD-10-CM | POA: Diagnosis not present

## 2015-01-03 DIAGNOSIS — N261 Atrophy of kidney (terminal): Secondary | ICD-10-CM | POA: Insufficient documentation

## 2015-01-31 ENCOUNTER — Ambulatory Visit (INDEPENDENT_AMBULATORY_CARE_PROVIDER_SITE_OTHER): Payer: Medicare Other

## 2015-01-31 ENCOUNTER — Telehealth: Payer: Self-pay | Admitting: Cardiology

## 2015-01-31 DIAGNOSIS — I509 Heart failure, unspecified: Secondary | ICD-10-CM

## 2015-01-31 DIAGNOSIS — Z95810 Presence of automatic (implantable) cardiac defibrillator: Secondary | ICD-10-CM

## 2015-01-31 NOTE — Telephone Encounter (Signed)
Spoke with pt and reminded pt of remote transmission that is due today. Pt verbalized understanding.   

## 2015-02-01 NOTE — Progress Notes (Signed)
EPIC Encounter for ICM Monitoring  Patient Name: [name] Terry is a 64 y.o. female Date: 02/01/2015 Primary Care Physican: Mackey Birchwood Primary Cardiologist: Johnsie Cancel Electrophysiologist: Allred Dry Weight: 196 lb       In the past month, have you:  1. Gained more than 2 pounds in a day or more than 5 pounds in a week? no  2. Had changes in your medications (with verification of current medications)? no  3. Had more shortness of breath than is usual for you? no  4. Limited your activity because of shortness of breath? no  5. Not been able to sleep because of shortness of breath? no  6. Had increased swelling in your feet or ankles? no  7. Had symptoms of dehydration (dizziness, dry mouth, increased thirst, decreased urine output) no  8. Had changes in sodium restriction? no  9. Been compliant with medication? Yes   ICM trend:  01/31/2015   Follow-up plan: ICM clinic phone appointment 03/06/2015.  Optivol thoracic impedance trending along baseline.  She denied any HF symptoms.  She reported her pharmacy had refilled Imdur prescription last week and she did not realize it was the same medication she was already taking because it was a different color and shaped pill.  She accidentally doubled her dose of Imdur to 60mg  x 1 week.  She is now taking the correct Imdur 30 mg daily.  She denied any side effects for the double dose such as low blood pressure, dizziness, confusion, vomiting, nausea or headache.   Advised I would let Dr Johnsie Cancel and Dr Rayann Heman know of the double dose x 1 week (occurred last week).   Advised to always check meds that she picks up from pharmacy and to speak with pharmacist if her pills are different colors or shapes. No changes today.  Copy of note sent to patient's primary care physician, primary cardiologist, and device following physician.  Rosalene Billings, RN, CCM 02/01/2015 10:13 AM

## 2015-02-17 ENCOUNTER — Telehealth: Payer: Self-pay | Admitting: Physician Assistant

## 2015-02-17 NOTE — Telephone Encounter (Signed)
Patient's daughter [name] Terry called - her mother began having chest pain today and EMS brought her to [facility]. They are concerned because all of her records are here and she would prefer being under the care of [facility]/Dr. Allred's team. Patient's daughter is requesting transfer to [facility]. This seems appropriate if all of her care has been through our system, but unfortunately we cannot just demand a transfer from our end. I told them this typically has to be facilitated by communication from the doctor she is currently under the care of at her current facility, to the physician on call at our facility. [name] Terry will speak with the nursing staff to verbalize her concerns at her current facility and will request transfer under their present team. Melina Copa PA-C

## 2015-02-20 ENCOUNTER — Emergency Department (HOSPITAL_COMMUNITY): Payer: Medicare Other

## 2015-02-20 ENCOUNTER — Encounter (HOSPITAL_COMMUNITY): Payer: Self-pay

## 2015-02-20 ENCOUNTER — Inpatient Hospital Stay (HOSPITAL_COMMUNITY)
Admission: EM | Admit: 2015-02-20 | Discharge: 2015-02-28 | DRG: 291 | Disposition: A | Discharge status: Home / Self Care | Payer: Medicare Other | Attending: Internal Medicine | Admitting: Internal Medicine

## 2015-02-20 DIAGNOSIS — Z87891 Personal history of nicotine dependence: Secondary | ICD-10-CM

## 2015-02-20 DIAGNOSIS — K2970 Gastritis, unspecified, without bleeding: Secondary | ICD-10-CM | POA: Diagnosis present

## 2015-02-20 DIAGNOSIS — I252 Old myocardial infarction: Secondary | ICD-10-CM

## 2015-02-20 DIAGNOSIS — R748 Abnormal levels of other serum enzymes: Secondary | ICD-10-CM | POA: Diagnosis present

## 2015-02-20 DIAGNOSIS — R109 Unspecified abdominal pain: Secondary | ICD-10-CM

## 2015-02-20 DIAGNOSIS — Z8572 Personal history of non-Hodgkin lymphomas: Secondary | ICD-10-CM

## 2015-02-20 DIAGNOSIS — Z803 Family history of malignant neoplasm of breast: Secondary | ICD-10-CM

## 2015-02-20 DIAGNOSIS — E1122 Type 2 diabetes mellitus with diabetic chronic kidney disease: Secondary | ICD-10-CM | POA: Diagnosis present

## 2015-02-20 DIAGNOSIS — Z923 Personal history of irradiation: Secondary | ICD-10-CM

## 2015-02-20 DIAGNOSIS — Z823 Family history of stroke: Secondary | ICD-10-CM

## 2015-02-20 DIAGNOSIS — R7989 Other specified abnormal findings of blood chemistry: Secondary | ICD-10-CM | POA: Diagnosis not present

## 2015-02-20 DIAGNOSIS — Z8673 Personal history of transient ischemic attack (TIA), and cerebral infarction without residual deficits: Secondary | ICD-10-CM

## 2015-02-20 DIAGNOSIS — D72829 Elevated white blood cell count, unspecified: Secondary | ICD-10-CM

## 2015-02-20 DIAGNOSIS — J129 Viral pneumonia, unspecified: Secondary | ICD-10-CM | POA: Diagnosis present

## 2015-02-20 DIAGNOSIS — R1310 Dysphagia, unspecified: Secondary | ICD-10-CM | POA: Diagnosis present

## 2015-02-20 DIAGNOSIS — Z9221 Personal history of antineoplastic chemotherapy: Secondary | ICD-10-CM

## 2015-02-20 DIAGNOSIS — I447 Left bundle-branch block, unspecified: Secondary | ICD-10-CM | POA: Diagnosis present

## 2015-02-20 DIAGNOSIS — Z95810 Presence of automatic (implantable) cardiac defibrillator: Secondary | ICD-10-CM

## 2015-02-20 DIAGNOSIS — Z9861 Coronary angioplasty status: Secondary | ICD-10-CM

## 2015-02-20 DIAGNOSIS — K5790 Diverticulosis of intestine, part unspecified, without perforation or abscess without bleeding: Secondary | ICD-10-CM

## 2015-02-20 DIAGNOSIS — E785 Hyperlipidemia, unspecified: Secondary | ICD-10-CM | POA: Diagnosis present

## 2015-02-20 DIAGNOSIS — Z7902 Long term (current) use of antithrombotics/antiplatelets: Secondary | ICD-10-CM

## 2015-02-20 DIAGNOSIS — I480 Paroxysmal atrial fibrillation: Secondary | ICD-10-CM | POA: Diagnosis present

## 2015-02-20 DIAGNOSIS — I5023 Acute on chronic systolic (congestive) heart failure: Secondary | ICD-10-CM | POA: Diagnosis not present

## 2015-02-20 DIAGNOSIS — Z8249 Family history of ischemic heart disease and other diseases of the circulatory system: Secondary | ICD-10-CM

## 2015-02-20 DIAGNOSIS — K219 Gastro-esophageal reflux disease without esophagitis: Secondary | ICD-10-CM | POA: Diagnosis present

## 2015-02-20 DIAGNOSIS — Z833 Family history of diabetes mellitus: Secondary | ICD-10-CM

## 2015-02-20 DIAGNOSIS — E669 Obesity, unspecified: Secondary | ICD-10-CM | POA: Diagnosis present

## 2015-02-20 DIAGNOSIS — Z955 Presence of coronary angioplasty implant and graft: Secondary | ICD-10-CM

## 2015-02-20 DIAGNOSIS — F419 Anxiety disorder, unspecified: Secondary | ICD-10-CM | POA: Diagnosis present

## 2015-02-20 DIAGNOSIS — I340 Nonrheumatic mitral (valve) insufficiency: Secondary | ICD-10-CM | POA: Diagnosis present

## 2015-02-20 DIAGNOSIS — Z91041 Radiographic dye allergy status: Secondary | ICD-10-CM

## 2015-02-20 DIAGNOSIS — I200 Unstable angina: Secondary | ICD-10-CM | POA: Insufficient documentation

## 2015-02-20 DIAGNOSIS — Z811 Family history of alcohol abuse and dependence: Secondary | ICD-10-CM

## 2015-02-20 DIAGNOSIS — R778 Other specified abnormalities of plasma proteins: Secondary | ICD-10-CM | POA: Diagnosis present

## 2015-02-20 DIAGNOSIS — I130 Hypertensive heart and chronic kidney disease with heart failure and stage 1 through stage 4 chronic kidney disease, or unspecified chronic kidney disease: Secondary | ICD-10-CM | POA: Diagnosis present

## 2015-02-20 DIAGNOSIS — I255 Ischemic cardiomyopathy: Secondary | ICD-10-CM

## 2015-02-20 DIAGNOSIS — I10 Essential (primary) hypertension: Secondary | ICD-10-CM | POA: Diagnosis present

## 2015-02-20 DIAGNOSIS — R946 Abnormal results of thyroid function studies: Secondary | ICD-10-CM | POA: Diagnosis present

## 2015-02-20 DIAGNOSIS — R0602 Shortness of breath: Secondary | ICD-10-CM

## 2015-02-20 DIAGNOSIS — R945 Abnormal results of liver function studies: Secondary | ICD-10-CM

## 2015-02-20 DIAGNOSIS — Z7982 Long term (current) use of aspirin: Secondary | ICD-10-CM

## 2015-02-20 DIAGNOSIS — E876 Hypokalemia: Secondary | ICD-10-CM | POA: Diagnosis not present

## 2015-02-20 DIAGNOSIS — Z888 Allergy status to other drugs, medicaments and biological substances status: Secondary | ICD-10-CM

## 2015-02-20 DIAGNOSIS — I472 Ventricular tachycardia: Secondary | ICD-10-CM | POA: Diagnosis present

## 2015-02-20 DIAGNOSIS — I2510 Atherosclerotic heart disease of native coronary artery without angina pectoris: Secondary | ICD-10-CM

## 2015-02-20 DIAGNOSIS — I25118 Atherosclerotic heart disease of native coronary artery with other forms of angina pectoris: Secondary | ICD-10-CM | POA: Diagnosis present

## 2015-02-20 DIAGNOSIS — I2584 Coronary atherosclerosis due to calcified coronary lesion: Secondary | ICD-10-CM | POA: Diagnosis present

## 2015-02-20 DIAGNOSIS — K449 Diaphragmatic hernia without obstruction or gangrene: Secondary | ICD-10-CM | POA: Diagnosis present

## 2015-02-20 DIAGNOSIS — Z7984 Long term (current) use of oral hypoglycemic drugs: Secondary | ICD-10-CM

## 2015-02-20 DIAGNOSIS — Z6834 Body mass index (BMI) 34.0-34.9, adult: Secondary | ICD-10-CM

## 2015-02-20 DIAGNOSIS — N183 Chronic kidney disease, stage 3 (moderate): Secondary | ICD-10-CM | POA: Diagnosis present

## 2015-02-20 DIAGNOSIS — M353 Polymyalgia rheumatica: Secondary | ICD-10-CM | POA: Diagnosis present

## 2015-02-20 HISTORY — DX: Gastritis, unspecified, without bleeding: K29.70

## 2015-02-20 HISTORY — DX: Diaphragmatic hernia without obstruction or gangrene: K44.9

## 2015-02-20 HISTORY — DX: Nonrheumatic mitral (valve) insufficiency: I34.0

## 2015-02-20 HISTORY — DX: Chronic systolic (congestive) heart failure: I50.22

## 2015-02-20 HISTORY — DX: Paroxysmal atrial fibrillation: I48.0

## 2015-02-20 HISTORY — DX: Atrophy of kidney (terminal): N26.1

## 2015-02-20 HISTORY — DX: Diverticulosis of intestine, part unspecified, without perforation or abscess without bleeding: K57.90

## 2015-02-20 LAB — COMPREHENSIVE METABOLIC PANEL
ALBUMIN: 2.8 g/dL — AB (ref 3.5–5.0)
ALK PHOS: 55 U/L (ref 38–126)
ALT: 26 U/L (ref 14–54)
ANION GAP: 10 (ref 5–15)
AST: 31 U/L (ref 15–41)
BILIRUBIN TOTAL: 0.7 mg/dL (ref 0.3–1.2)
BUN: 27 mg/dL — AB (ref 6–20)
CALCIUM: 8.4 mg/dL — AB (ref 8.9–10.3)
CO2: 25 mmol/L (ref 22–32)
CREATININE: 1.91 mg/dL — AB (ref 0.44–1.00)
Chloride: 100 mmol/L — ABNORMAL LOW (ref 101–111)
GFR calc Af Amer: 31 mL/min — ABNORMAL LOW (ref >60)
GFR calc non Af Amer: 27 mL/min — ABNORMAL LOW (ref >60)
GLUCOSE: 125 mg/dL — AB (ref 65–99)
Potassium: 3.7 mmol/L (ref 3.5–5.1)
Sodium: 135 mmol/L (ref 135–145)
TOTAL PROTEIN: 6.3 g/dL — AB (ref 6.5–8.1)

## 2015-02-20 LAB — CBC WITH DIFFERENTIAL/PLATELET
BASOS PCT: 0 %
Basophils Absolute: 0 10*3/uL (ref 0.0–0.1)
EOS ABS: 0.3 10*3/uL (ref 0.0–0.7)
Eosinophils Relative: 4 %
HEMATOCRIT: 34.8 % — AB (ref 36.0–46.0)
Hemoglobin: 11.7 g/dL — ABNORMAL LOW (ref 12.0–15.0)
Lymphocytes Relative: 16 %
Lymphs Abs: 1.1 10*3/uL (ref 0.7–4.0)
MCH: 30.6 pg (ref 26.0–34.0)
MCHC: 33.6 g/dL (ref 30.0–36.0)
MCV: 91.1 fL (ref 78.0–100.0)
MONO ABS: 0.9 10*3/uL (ref 0.1–1.0)
MONOS PCT: 14 %
NEUTROS ABS: 4.3 10*3/uL (ref 1.7–7.7)
Neutrophils Relative %: 66 %
Platelets: 205 10*3/uL (ref 150–400)
RBC: 3.82 MIL/uL — ABNORMAL LOW (ref 3.87–5.11)
RDW: 16 % — AB (ref 11.5–15.5)
WBC: 6.6 10*3/uL (ref 4.0–10.5)

## 2015-02-20 LAB — I-STAT TROPONIN, ED: Troponin i, poc: 1.94 ng/mL (ref 0.00–0.08)

## 2015-02-20 LAB — TROPONIN I: Troponin I: 4.2 ng/mL (ref <0.031)

## 2015-02-20 LAB — BRAIN NATRIURETIC PEPTIDE: B Natriuretic Peptide: 463.9 pg/mL — ABNORMAL HIGH (ref 0.0–100.0)

## 2015-02-20 MED ORDER — CLINDAMYCIN HCL 300 MG PO CAPS
300.0000 mg | ORAL_CAPSULE | Freq: Three times a day (TID) | ORAL | Status: DC
  Administered 2015-02-20 – 2015-02-28 (×23): 300 mg via ORAL
  Filled 2015-02-20 (×28): qty 1

## 2015-02-20 MED ORDER — ASPIRIN EC 81 MG PO TBEC: 81.0000 mg | DELAYED_RELEASE_TABLET | Freq: Every day | ORAL | Status: DC

## 2015-02-20 MED ORDER — CLOPIDOGREL BISULFATE 75 MG PO TABS
75.0000 mg | ORAL_TABLET | Freq: Every day | ORAL | Status: DC
  Administered 2015-02-20 – 2015-02-27 (×8): 75 mg via ORAL
  Filled 2015-02-20 (×8): qty 1

## 2015-02-20 MED ORDER — NITROGLYCERIN 0.4 MG SL SUBL
0.4000 mg | SUBLINGUAL_TABLET | SUBLINGUAL | Status: DC | PRN
  Administered 2015-02-20 – 2015-02-21 (×2): 0.4 mg via SUBLINGUAL
  Filled 2015-02-20 (×3): qty 1

## 2015-02-20 MED ORDER — ATORVASTATIN CALCIUM 80 MG PO TABS
80.0000 mg | ORAL_TABLET | Freq: Every day | ORAL | Status: DC
  Administered 2015-02-21 – 2015-02-27 (×7): 80 mg via ORAL
  Filled 2015-02-20 (×7): qty 1

## 2015-02-20 MED ORDER — CARVEDILOL 6.25 MG PO TABS
6.2500 mg | ORAL_TABLET | Freq: Two times a day (BID) | ORAL | Status: DC
  Administered 2015-02-21 – 2015-02-28 (×14): 6.25 mg via ORAL
  Filled 2015-02-20 (×14): qty 1

## 2015-02-20 MED ORDER — HEPARIN SODIUM (PORCINE) 5000 UNIT/ML IJ SOLN
5000.0000 [IU] | Freq: Three times a day (TID) | INTRAMUSCULAR | Status: DC
  Administered 2015-02-20 – 2015-02-28 (×22): 5000 [IU] via SUBCUTANEOUS
  Filled 2015-02-20 (×22): qty 1

## 2015-02-20 MED ORDER — NITROGLYCERIN 0.4 MG SL SUBL: 0.4000 mg | SUBLINGUAL_TABLET | SUBLINGUAL | Status: DC | PRN

## 2015-02-20 MED ORDER — ACETAMINOPHEN 325 MG PO TABS: 650.0000 mg | ORAL_TABLET | ORAL | Status: DC | PRN

## 2015-02-20 MED ORDER — AMIODARONE HCL 200 MG PO TABS
200.0000 mg | ORAL_TABLET | Freq: Every day | ORAL | Status: DC
  Administered 2015-02-21 – 2015-02-26 (×6): 200 mg via ORAL
  Filled 2015-02-20 (×6): qty 1

## 2015-02-20 MED ORDER — ASPIRIN EC 81 MG PO TBEC
81.0000 mg | DELAYED_RELEASE_TABLET | Freq: Every day | ORAL | Status: DC
  Administered 2015-02-21 – 2015-02-28 (×8): 81 mg via ORAL
  Filled 2015-02-20 (×8): qty 1

## 2015-02-20 MED ORDER — DIPHENHYDRAMINE HCL 25 MG PO CAPS
25.0000 mg | ORAL_CAPSULE | Freq: Once | ORAL | Status: AC
  Administered 2015-02-20: 25 mg via ORAL
  Filled 2015-02-20: qty 1

## 2015-02-20 MED ORDER — FUROSEMIDE 10 MG/ML IJ SOLN
40.0000 mg | Freq: Once | INTRAMUSCULAR | Status: AC
  Administered 2015-02-20: 40 mg via INTRAVENOUS
  Filled 2015-02-20: qty 4

## 2015-02-20 MED ORDER — ONDANSETRON HCL 4 MG/2ML IJ SOLN
4.0000 mg | Freq: Four times a day (QID) | INTRAMUSCULAR | Status: DC | PRN
  Administered 2015-02-25 – 2015-02-26 (×3): 4 mg via INTRAVENOUS
  Filled 2015-02-20 (×3): qty 2

## 2015-02-20 MED ORDER — PANTOPRAZOLE SODIUM 40 MG PO TBEC
40.0000 mg | DELAYED_RELEASE_TABLET | Freq: Every day | ORAL | Status: DC
  Administered 2015-02-20 – 2015-02-24 (×5): 40 mg via ORAL
  Filled 2015-02-20 (×5): qty 1

## 2015-02-20 MED ORDER — GLIPIZIDE ER 2.5 MG PO TB24
2.5000 mg | ORAL_TABLET | Freq: Every day | ORAL | Status: DC
  Administered 2015-02-21 – 2015-02-28 (×8): 2.5 mg via ORAL
  Filled 2015-02-20 (×10): qty 1

## 2015-02-20 NOTE — Progress Notes (Signed)
This encounter was created in error - please disregard.

## 2015-02-20 NOTE — ED Notes (Signed)
Report attempted 

## 2015-02-20 NOTE — ED Provider Notes (Signed)
CSN: [id number]     Arrival date & .time 02/20/15  1552 History   First MD Initiated Contact with Patient 02/20/15 1554     Chief Complaint  Patient presents with  . Shortness of Breath  . Chest Pain     (Consider location/radiation/quality/duration/timing/severity/associated sxs/prior Treatment) Patient is a 64 y.o. female presenting with shortness of breath.  Shortness of Breath Severity:  Moderate Onset quality:  Gradual Duration:  1 day Timing:  Constant Progression:  Unchanged Chronicity:  Recurrent Context comment:  Dc from [facility] yesterday Relieved by:  Nothing Worsened by:  Nothing tried Associated symptoms: chest pain and cough   Associated symptoms: no abdominal pain, no fever, no rash and no sore throat     Past Medical History  Diagnosis Date  . Ischemic cardiomyopathy     a. s/p MDT dual chamber ICD implanted 2013 by Dr Westley Gambles at [facility], now followed by Dr Rayann Heman  . History of stroke      a. 1993  . Coronary artery disease     a. s/p previous interventions in [place] per patient, no records available b. cath 04/2013 with non-obstructive disease   . Lymphoma ([place])     a. s/p chemo/radiation 2009  . CHF (congestive heart failure) ([place])   . HTN (hypertension)   . HLD (hyperlipidemia)   . Ventricular tachycardia ([place])     a. CL 300 msec requiring ICD shocks therpay 5/14 b. recurrent VT 04/2014, placed on amiodarone c. recurrent VT 05/2014 s/p ablation by Dr Rayann Heman d. s/p repeat ablation 07/2014  . DM2 (diabetes mellitus, type 2), newly diagnosed    . CKD (chronic kidney disease) stage 3, GFR 30-59 ml/min    . Polymyalgia rheumatica ([place])   . Depression   . GERD (gastroesophageal reflux disease)    Past Surgical History  Procedure Laterality Date  . Cardiac defibrillator placement  03/2011    MDT ICD implanted at [facility] by Dr Tana Coast  . Coronary angioplasty with stent placement    . Left and right heart catheterization with coronary angiogram N/A  05/03/2013    non-obstructive CAD  . V-tach ablation N/A 05/26/2014    VT ablation by Dr Rayann Heman - PVCs arising from the inferolateral LV, extensive substrate ablation  . Electrophysiologic study N/A 08/04/2014    Procedure: V Tach Ablation;  Surgeon: Thompson Grayer, MD;  Location: [facility];  Service: Cardiovascular;  Laterality: N/A;   Family History  Problem Relation Age of Onset  . Diabetes Father   . Hypertension Father   . Heart disease Father   . Alcoholism Father   . Hypertension Mother   . Heart disease Mother   . Breast cancer Mother   . Stroke Mother   . Diabetes Brother   . Diabetes Brother   . Diabetes Brother   . Diabetes Brother   . Hypertension Sister   . Heart disease Sister   . Alcoholism Sister   . Thyroid disease Sister   . Heart attack Sister   . Heart attack Brother   . Stroke Brother    Social History  Substance Use Topics  . Smoking status: Former Smoker -- 0.25 packs/day    Quit date: 05/20/2014  . Smokeless tobacco: Never Used     Comment: she is not ready to quit  . Alcohol Use: No   OB History    No data available     Review of Systems  Constitutional: Negative for fever.  HENT: Negative for  sore throat.   Respiratory: Positive for cough and shortness of breath.   Cardiovascular: Positive for chest pain.  Gastrointestinal: Negative for abdominal pain.  Skin: Negative for rash.  All other systems reviewed and are negative.     Allergies  Flagyl; Contrast media; Ioxaglate; Potassium sulfate; and Potassium-containing compounds  Home Medications   Prior to Admission medications   Medication Sig Start Date End Date Taking? Authorizing Provider  amiodarone (PACERONE) 200 MG tablet Take 1 tablet (200 mg total) by mouth daily. 08/05/14  Yes Amber Sena Slate, NP  amLODipine (NORVASC) 10 MG tablet Take 10 mg by mouth daily.   Yes Historical Provider, MD  aspirin EC 81 MG tablet Take 81 mg by mouth daily.   Yes Historical Provider, MD   atorvastatin (LIPITOR) 40 MG tablet Take 1 tablet (40 mg total) by mouth daily at 6 PM. 06/20/14  Yes Thompson Grayer, MD  carvedilol (COREG) 6.25 MG tablet Take 6.25 mg by mouth 2 (two) times daily with a meal.   Yes Historical Provider, MD  clindamycin (CLEOCIN) 300 MG capsule Take 300 mg by mouth 3 (three) times daily. Started medication on 02-20-15   Yes Historical Provider, MD  clopidogrel (PLAVIX) 75 MG tablet Take 75 mg by mouth at bedtime.    Yes Historical Provider, MD  furosemide (LASIX) 40 MG tablet Take 1 tablet (40 mg total) by mouth daily. 08/18/14  Yes Amber Sena Slate, NP  glipiZIDE (GLUCOTROL XL) 2.5 MG 24 hr tablet Take 2.5 mg by mouth daily with breakfast.   Yes Historical Provider, MD  Multiple Vitamin (MULTI VITAMIN DAILY PO) Take 1 tablet by mouth daily.   Yes Historical Provider, MD  nitroGLYCERIN (NITROSTAT) 0.4 MG SL tablet Place 1 tablet (0.4 mg total) under the tongue every 5 (five) minutes as needed for chest pain. 07/04/14  Yes Amber Sena Slate, NP  pantoprazole (PROTONIX) 40 MG tablet Take 40 mg by mouth daily.   Yes Historical Provider, MD  potassium chloride (K-DUR,KLOR-CON) 10 MEQ tablet Take 15 mEq by mouth every morning.    Yes Historical Provider, MD   BP 148/95 mmHg  Pulse 81  Temp(Src) 98.2 F (36.8 C) (Oral)  Resp 20  Ht 5\' 5"  (1.651 m)  Wt 201 lb 4.5 oz (91.3 kg)  BMI 33.49 kg/m2  SpO2 95% Physical Exam  Constitutional: She is oriented to person, place, and time. She appears well-developed and well-nourished.  HENT:  Head: Normocephalic and atraumatic.  Right Ear: External ear normal.  Left Ear: External ear normal.  Eyes: Conjunctivae and EOM are normal. Pupils are equal, round, and reactive to light.  Neck: Normal range of motion. Neck supple.  Cardiovascular: Normal rate, regular rhythm, normal heart sounds and intact distal pulses.   Pulmonary/Chest: Effort normal and breath sounds normal.  Abdominal: Soft. Bowel sounds are normal. There is no  tenderness.  Musculoskeletal: Normal range of motion.  Neurological: She is alert and oriented to person, place, and time.  Skin: Skin is warm and dry.  Vitals reviewed.   ED Course  Procedures (including critical care time) Labs Review Labs Reviewed  CBC WITH DIFFERENTIAL/PLATELET - Abnormal; Notable for the following:    RBC 3.82 (*)    Hemoglobin 11.7 (*)    HCT 34.8 (*)    RDW 16.0 (*)    All other components within normal limits  BRAIN NATRIURETIC PEPTIDE - Abnormal; Notable for the following:    B Natriuretic Peptide 463.9 (*)    All other  components within normal limits  COMPREHENSIVE METABOLIC PANEL - Abnormal; Notable for the following:    Chloride 100 (*)    Glucose, Bld 125 (*)    BUN 27 (*)    Creatinine, Ser 1.91 (*)    Calcium 8.4 (*)    Total Protein 6.3 (*)    Albumin 2.8 (*)    GFR calc non Af Amer 27 (*)    GFR calc Af Amer 31 (*)    All other components within normal limits  I-STAT TROPOININ, ED - Abnormal; Notable for the following:    Troponin i, poc 1.94 (*)    All other components within normal limits  TROPONIN I  TROPONIN I  TROPONIN I  BASIC METABOLIC PANEL  CBC    Imaging Review Dg Chest 2 View  02/20/2015  CLINICAL DATA:  Chest pain for 1 day.  Dyspnea. EXAM: CHEST  2 VIEW COMPARISON:  06/29/2014. FINDINGS: The cardiac silhouette is mildly enlarged and the aorta remains tortuous and partially calcified. The interstitial markings are mildly prominent with mild progression. Normal vascularity. No pleural fluid. Minimal thoracic spine degenerative changes. Stable left subclavian pacer and AICD leads. IMPRESSION: Interval mild cardiomegaly and mildly prominent interstitial markings. The interstitial prominence could be due to viral pneumonitis or minimal interstitial pulmonary edema. Electronically Signed   By: Claudie Revering M.D.   On: 02/20/2015 17:44   I have personally reviewed and evaluated these images and lab results as part of my medical  decision-making.   EKG Interpretation   Date/Time:  Monday February 20 2015 16:03:47 EST Ventricular Rate:  80 PR Interval:  185 QRS Duration: 131 QT Interval:  428 QTC Calculation: 494 R Axis:   7 Text Interpretation:  Sinus rhythm Left bundle branch block, new since  prior Confirmed by Debby Freiberg [phone]) on 02/20/2015 4:08:45 PM     CRITICAL CARE Performed by: Debby Freiberg   Total critical care time: 35 minutes  Critical care time was exclusive of separately billable procedures and treating other patients.  Critical care was necessary to treat or prevent imminent or life-threatening deterioration.  Critical care was time spent personally by me on the following activities: development of treatment plan with patient and/or surrogate as well as nursing, discussions with consultants, evaluation of patient's response to treatment, examination of patient, obtaining history from patient or surrogate, ordering and performing treatments and interventions, ordering and review of laboratory studies, ordering and review of radiographic studies, pulse oximetry and re-evaluation of patient's condition.  MDM   Final diagnoses:  None    64 y.o. female with pertinent PMH of CAD, CHF, vtach with pacer, DM, CKD presents with recurrent dyspnea and chest pain.  Cath 1 week ago at [place] with occlusion of the distal vessel, not amenable for stenting.  Pt presents due to continued intermittent dyspnea and chest pain.  On arrival vitals and physical exam as above.  Wu as above with elevated trop.  Consulted cardiology for admission  I have reviewed all laboratory and imaging studies if ordered as above  No diagnosis found.    Debby Freiberg, MD 02/20/15 [phone]

## 2015-02-20 NOTE — H&P (Signed)
Patient ID: [name] Terry MRN: [id number], DOB/AGE: 08/13/1950   Admit date: 02/20/2015   Primary Physician: Bronson Curb, PA-C Primary Cardiologist: Dr. Rayann Heman  Pt. Profile:  [name] Terry is a 64 y.o. female with a history of Recent STEMI,  ICM, CAD, HTN, HLD, DM, PMR, CRI, and history of VT rs/p ablation March 2016 and May 2016 who presented to [facility] ED for evaluation of SOB and chest pain.   HPI: As above. The patient was admitted to [facility] about a week ago for STEMI. Emergent cardiac catheterization shows 100% blockage however not able to do any intervention per patient. Echocardiogram showed left ventricular function of 30-35%. Post cath patient developed a pulmonary edema, hemoptysis and pneumonia. Treated with IV Lasix and discharged home yesterday 11/27. She was discharged on dual antiplatelet therapy with aspirin and Plavix. Clindamycin 300 mg 3 times a day for 4 days for resolving pneumonia. Lipitor 80 mg once a day, Lasix 40 mg once a day and amiodarone 200 mg once a day.  She was feeling weak at home with intermittent shortness of breath and chest pain. She described the chest pain as a sharp at left-sided underneath breast. Today her shortness of breath was more severe that lead her to EMS call. Upon EMS arrival her systolic blood pressure was in 180s range improved with sublingual nitroglycerin x 2.   In ED currently she is chest pain free and breathing at her baseline. POC Troponin of 1.94. Creatinine of 1.91. Hgb of 11.7. CXR showed mild cardiomegaly with viral pneumonitis or minimal interstitial pulmonary edema. EKG shows normal sinus rhythm with a new onset of left bundle branch block. Could be present during STEMI. BNP 463.9.  The patient denies syncope, melena, blood in his stool, lower extremity edema, headache, PND.  Problem List  Past Medical History  Diagnosis Date  . Ischemic cardiomyopathy     a. s/p MDT dual chamber ICD implanted  2013 by Dr Westley Gambles at [facility], now followed by Dr Rayann Heman  . History of stroke      a. 1993  . Coronary artery disease     a. s/p previous interventions in [place] per patient, no records available b. cath 04/2013 with non-obstructive disease   . Lymphoma ([place])     a. s/p chemo/radiation 2009  . CHF (congestive heart failure) ([place])   . HTN (hypertension)   . HLD (hyperlipidemia)   . Ventricular tachycardia ([place])     a. CL 300 msec requiring ICD shocks therpay 5/14 b. recurrent VT 04/2014, placed on amiodarone c. recurrent VT 05/2014 s/p ablation by Dr Rayann Heman d. s/p repeat ablation 07/2014  . DM2 (diabetes mellitus, type 2), newly diagnosed    . CKD (chronic kidney disease) stage 3, GFR 30-59 ml/min    . Polymyalgia rheumatica ([place])   . Depression   . GERD (gastroesophageal reflux disease)     Past Surgical History  Procedure Laterality Date  . Cardiac defibrillator placement  03/2011    MDT ICD implanted at [facility] by Dr Tana Coast  . Coronary angioplasty with stent placement    . Left and right heart catheterization with coronary angiogram N/A 05/03/2013    non-obstructive CAD  . V-tach ablation N/A 05/26/2014    VT ablation by Dr Rayann Heman - PVCs arising from the inferolateral LV, extensive substrate ablation  . Electrophysiologic study N/A 08/04/2014    Procedure: V Tach Ablation;  Surgeon: Thompson Grayer, MD;  Location: [facility];  Service: Cardiovascular;  Laterality: N/A;     Allergies  Allergies  Allergen Reactions  . Flagyl [Metronidazole] Swelling and Rash    Face swells   . Contrast Media [Iodinated Diagnostic Agents] Rash  . Ioxaglate Rash  . Potassium Sulfate Rash and Other (See Comments)    Headaches  . Potassium-Containing Compounds Other (See Comments)    Headaches-- reports no problems now     Home Medications  Prior to Admission medications   Medication Sig Start Date End Date Taking? Authorizing Provider  amiodarone (PACERONE) 200 MG tablet Take 1 tablet (200 mg  total) by mouth daily. 08/05/14  Yes Amber Sena Slate, NP  amLODipine (NORVASC) 10 MG tablet Take 10 mg by mouth daily.   Yes Historical Provider, MD  aspirin EC 81 MG tablet Take 81 mg by mouth daily.   Yes Historical Provider, MD  atorvastatin (LIPITOR) 40 MG tablet Take 1 tablet (40 mg total) by mouth daily at 6 PM. 06/20/14  Yes Thompson Grayer, MD  carvedilol (COREG) 6.25 MG tablet Take 6.25 mg by mouth 2 (two) times daily with a meal.   Yes Historical Provider, MD  clindamycin (CLEOCIN) 300 MG capsule Take 300 mg by mouth 3 (three) times daily. Started medication on 02-20-15   Yes Historical Provider, MD  clopidogrel (PLAVIX) 75 MG tablet Take 75 mg by mouth at bedtime.    Yes Historical Provider, MD  ezetimibe (ZETIA) 10 MG tablet Take 10 mg by mouth at bedtime.    Yes Historical Provider, MD  furosemide (LASIX) 40 MG tablet Take 1 tablet (40 mg total) by mouth daily. 08/18/14  Yes Amber Sena Slate, NP  glipiZIDE (GLUCOTROL XL) 2.5 MG 24 hr tablet Take 2.5 mg by mouth daily with breakfast.   Yes Historical Provider, MD  Multiple Vitamin (MULTI VITAMIN DAILY PO) Take 1 tablet by mouth daily.   Yes Historical Provider, MD  nitroGLYCERIN (NITROSTAT) 0.4 MG SL tablet Place 1 tablet (0.4 mg total) under the tongue every 5 (five) minutes as needed for chest pain. 07/04/14  Yes Amber Sena Slate, NP  pantoprazole (PROTONIX) 40 MG tablet Take 40 mg by mouth daily.   Yes Historical Provider, MD  potassium chloride (K-DUR,KLOR-CON) 10 MEQ tablet Take 15 mEq by mouth every morning.    Yes Historical Provider, MD  metoprolol succinate (TOPROL-XL) 50 MG 24 hr tablet Take 1 tablet (50 mg total) by mouth 2 (two) times daily. Take with or immediately following a meal. Patient not taking: Reported on 02/20/2015 05/27/14   Brett Canales, PA-C    Family History  Family History  Problem Relation Age of Onset  . Diabetes Father   . Hypertension Father   . Heart disease Father   . Alcoholism Father   . Hypertension  Mother   . Heart disease Mother   . Breast cancer Mother   . Stroke Mother   . Diabetes Brother   . Diabetes Brother   . Diabetes Brother   . Diabetes Brother   . Hypertension Sister   . Heart disease Sister   . Alcoholism Sister   . Thyroid disease Sister   . Heart attack Sister   . Heart attack Brother   . Stroke Brother    Family Status  Relation Status Death Age  . Father Deceased   . Mother Deceased   . Brother Alive   . Brother Alive   . Brother Alive   . Brother Alive   . Sister Alive   .  Sister Alive   . Sister Alive   . Sister Alive   . Sister Alive   . Brother Deceased   . Brother Deceased      Social History  Social History   Social History  . Marital Status: Single    Spouse Name: N/A  . Number of Children: 2  . Years of Education: N/A   Occupational History  . Not on file.   Social History Main Topics  . Smoking status: Former Smoker -- 0.25 packs/day    Quit date: 05/20/2014  . Smokeless tobacco: Never Used     Comment: she is not ready to quit  . Alcohol Use: No  . Drug Use: No  . Sexual Activity: Not on file   Other Topics Concern  . Not on file   Social History Narrative     All other systems reviewed and are otherwise negative except as noted above.  Physical Exam  Blood pressure 109/79, pulse 67, temperature 98.3 F (36.8 C), temperature source Oral, resp. rate 27, SpO2 99 %.  General: Pleasant, NAD Psych: Normal affect. Neuro: Alert and oriented X 3. Moves all extremities spontaneously. HEENT: Normal  Neck: Supple without bruits or JVD. Lungs:  Resp regular and unlabored, CTA. Heart: RRR no s3, s4, or murmurs. Abdomen: Soft, non-tender, non-distended, BS + x 4.  Extremities: No clubbing, cyanosis or edema. DP/PT/Radials 2+ and equal bilaterally.  Labs  No results for input(s): CKTOTAL, CKMB, TROPONINI in the last 72 hours. Lab Results  Component Value Date   WBC 6.6 02/20/2015   HGB 11.7* 02/20/2015   HCT 34.8*  02/20/2015   MCV 91.1 02/20/2015   PLT 205 02/20/2015    Recent Labs Lab 02/20/15 1645  NA 135  K 3.7  CL 100*  CO2 25  BUN 27*  CREATININE 1.91*  CALCIUM 8.4*  PROT 6.3*  BILITOT 0.7  ALKPHOS 55  ALT 26  AST 31  GLUCOSE 125*   Lab Results  Component Value Date   CHOL 123 05/20/2014   HDL 44 05/20/2014   LDLCALC 69 05/20/2014   TRIG 50 05/20/2014   No results found for: DDIMER   Radiology/Studies  Dg Chest 2 View  02/20/2015  CLINICAL DATA:  Chest pain for 1 day.  Dyspnea. EXAM: CHEST  2 VIEW COMPARISON:  06/29/2014. FINDINGS: The cardiac silhouette is mildly enlarged and the aorta remains tortuous and partially calcified. The interstitial markings are mildly prominent with mild progression. Normal vascularity. No pleural fluid. Minimal thoracic spine degenerative changes. Stable left subclavian pacer and AICD leads. IMPRESSION: Interval mild cardiomegaly and mildly prominent interstitial markings. The interstitial prominence could be due to viral pneumonitis or minimal interstitial pulmonary edema. Electronically Signed   By: Claudie Revering M.D.   On: 02/20/2015 17:44    ECG  Vent. rate 80 BPM PR interval 185 ms QRS duration 131 ms QT/QTc 428/494 ms P-R-T axes 9 7 75  ASSESSMENT AND PLAN  1. Elevated troponin - POC trop of 1.94. Symptoms resolving from recent STEMI.  Emergent cardiac catheterization shows 100% blockage however not able to do any intervention per patient. Echocardiogram showed left ventricular function of 30-35%. We will get medical record in the morning. - Continue aspirin, Plavix, Coreg and statin. Not on ACE due to chronic kidney disease. - Cycle troponin and EKGs.  2. ICM with systolic HF - Echocardiogram with a 30-35%.  - Post cath patient developed moderately edema, pneumonia and hemoptysis. - Will give IV Lasix 40  mg daily and reassess in the morning.  3. Acute on chronic kidney disease - Watch her renal function with diuresis.  4.  Resolving pneumonia. - She was discharged with clindamycin 300 mg 3 times a day for 4 days. - Will resume for 3 more days.  5. VT rs/p ablation March 2016 and May 2016  - Resume amio  6. HTN - Her systolic blood pressure was in the 180s per EMS that improved after sublingual nitroglycerin x 2. - Blood pressure now 109/79. Will add Indur tomorrow if blood pressure is stable.  Signed, Leanor Kail, PA-C 02/20/2015, 7:23 PM Pager [phone]

## 2015-02-20 NOTE — ED Notes (Signed)
Per [facility], Patient started to have left-sided chest pressure with no radiation noted starting 90 minutes ago. Pt has Hx of STEMI one week ago and was discharged from [facility]. Patient went home with SOB last night. Patient denies any nausea, vomiting, diarrhea, or diaphoresis. 104 CBG, 97.9 Temp, 124/83, 24 RR, 28 CO2, 88 HR NSR. Patient was given 324 mg of Aspirin and 1 Nitro with complete relief. Pt arrives to ED with 0/10 Chest Pain. Hx of Vtach, HTN, CAD, Chronic Renal Insufficiency, and Flash Pulmonary Edema during Admission.

## 2015-02-20 NOTE — ED Notes (Signed)
Pt reports indegestion after eating sandwich and Sprite

## 2015-02-21 ENCOUNTER — Encounter: Payer: Medicare Other | Admitting: Physician Assistant

## 2015-02-21 DIAGNOSIS — Z9861 Coronary angioplasty status: Secondary | ICD-10-CM

## 2015-02-21 DIAGNOSIS — I480 Paroxysmal atrial fibrillation: Secondary | ICD-10-CM | POA: Diagnosis present

## 2015-02-21 DIAGNOSIS — J129 Viral pneumonia, unspecified: Secondary | ICD-10-CM | POA: Diagnosis not present

## 2015-02-21 DIAGNOSIS — I130 Hypertensive heart and chronic kidney disease with heart failure and stage 1 through stage 4 chronic kidney disease, or unspecified chronic kidney disease: Secondary | ICD-10-CM | POA: Diagnosis present

## 2015-02-21 DIAGNOSIS — Z8249 Family history of ischemic heart disease and other diseases of the circulatory system: Secondary | ICD-10-CM | POA: Diagnosis not present

## 2015-02-21 DIAGNOSIS — E785 Hyperlipidemia, unspecified: Secondary | ICD-10-CM

## 2015-02-21 DIAGNOSIS — R748 Abnormal levels of other serum enzymes: Secondary | ICD-10-CM | POA: Diagnosis present

## 2015-02-21 DIAGNOSIS — K2970 Gastritis, unspecified, without bleeding: Secondary | ICD-10-CM | POA: Diagnosis present

## 2015-02-21 DIAGNOSIS — K5790 Diverticulosis of intestine, part unspecified, without perforation or abscess without bleeding: Secondary | ICD-10-CM | POA: Diagnosis present

## 2015-02-21 DIAGNOSIS — E669 Obesity, unspecified: Secondary | ICD-10-CM | POA: Diagnosis present

## 2015-02-21 DIAGNOSIS — R0602 Shortness of breath: Secondary | ICD-10-CM | POA: Diagnosis present

## 2015-02-21 DIAGNOSIS — Z888 Allergy status to other drugs, medicaments and biological substances status: Secondary | ICD-10-CM | POA: Diagnosis not present

## 2015-02-21 DIAGNOSIS — Z87891 Personal history of nicotine dependence: Secondary | ICD-10-CM | POA: Diagnosis not present

## 2015-02-21 DIAGNOSIS — I447 Left bundle-branch block, unspecified: Secondary | ICD-10-CM | POA: Diagnosis present

## 2015-02-21 DIAGNOSIS — Z6834 Body mass index (BMI) 34.0-34.9, adult: Secondary | ICD-10-CM | POA: Diagnosis not present

## 2015-02-21 DIAGNOSIS — Z7982 Long term (current) use of aspirin: Secondary | ICD-10-CM | POA: Diagnosis not present

## 2015-02-21 DIAGNOSIS — Z923 Personal history of irradiation: Secondary | ICD-10-CM | POA: Diagnosis not present

## 2015-02-21 DIAGNOSIS — M353 Polymyalgia rheumatica: Secondary | ICD-10-CM | POA: Diagnosis present

## 2015-02-21 DIAGNOSIS — I2510 Atherosclerotic heart disease of native coronary artery without angina pectoris: Secondary | ICD-10-CM

## 2015-02-21 DIAGNOSIS — Z7902 Long term (current) use of antithrombotics/antiplatelets: Secondary | ICD-10-CM | POA: Diagnosis not present

## 2015-02-21 DIAGNOSIS — I5023 Acute on chronic systolic (congestive) heart failure: Secondary | ICD-10-CM | POA: Diagnosis not present

## 2015-02-21 DIAGNOSIS — R7989 Other specified abnormal findings of blood chemistry: Secondary | ICD-10-CM | POA: Diagnosis not present

## 2015-02-21 DIAGNOSIS — F419 Anxiety disorder, unspecified: Secondary | ICD-10-CM | POA: Diagnosis present

## 2015-02-21 DIAGNOSIS — I472 Ventricular tachycardia: Secondary | ICD-10-CM | POA: Diagnosis not present

## 2015-02-21 DIAGNOSIS — Z8572 Personal history of non-Hodgkin lymphomas: Secondary | ICD-10-CM | POA: Diagnosis not present

## 2015-02-21 DIAGNOSIS — R946 Abnormal results of thyroid function studies: Secondary | ICD-10-CM | POA: Diagnosis present

## 2015-02-21 DIAGNOSIS — E876 Hypokalemia: Secondary | ICD-10-CM | POA: Diagnosis not present

## 2015-02-21 DIAGNOSIS — Z811 Family history of alcohol abuse and dependence: Secondary | ICD-10-CM | POA: Diagnosis not present

## 2015-02-21 DIAGNOSIS — I2584 Coronary atherosclerosis due to calcified coronary lesion: Secondary | ICD-10-CM | POA: Diagnosis present

## 2015-02-21 DIAGNOSIS — K5730 Diverticulosis of large intestine without perforation or abscess without bleeding: Secondary | ICD-10-CM | POA: Diagnosis not present

## 2015-02-21 DIAGNOSIS — Z823 Family history of stroke: Secondary | ICD-10-CM | POA: Diagnosis not present

## 2015-02-21 DIAGNOSIS — Z7984 Long term (current) use of oral hypoglycemic drugs: Secondary | ICD-10-CM | POA: Diagnosis not present

## 2015-02-21 DIAGNOSIS — E1122 Type 2 diabetes mellitus with diabetic chronic kidney disease: Secondary | ICD-10-CM | POA: Diagnosis present

## 2015-02-21 DIAGNOSIS — I252 Old myocardial infarction: Secondary | ICD-10-CM | POA: Diagnosis not present

## 2015-02-21 DIAGNOSIS — I255 Ischemic cardiomyopathy: Secondary | ICD-10-CM

## 2015-02-21 DIAGNOSIS — K449 Diaphragmatic hernia without obstruction or gangrene: Secondary | ICD-10-CM | POA: Diagnosis present

## 2015-02-21 DIAGNOSIS — I152 Hypertension secondary to endocrine disorders: Secondary | ICD-10-CM | POA: Diagnosis not present

## 2015-02-21 DIAGNOSIS — K219 Gastro-esophageal reflux disease without esophagitis: Secondary | ICD-10-CM | POA: Diagnosis present

## 2015-02-21 DIAGNOSIS — I340 Nonrheumatic mitral (valve) insufficiency: Secondary | ICD-10-CM | POA: Diagnosis present

## 2015-02-21 DIAGNOSIS — Z9221 Personal history of antineoplastic chemotherapy: Secondary | ICD-10-CM | POA: Diagnosis not present

## 2015-02-21 DIAGNOSIS — Z803 Family history of malignant neoplasm of breast: Secondary | ICD-10-CM | POA: Diagnosis not present

## 2015-02-21 DIAGNOSIS — Z833 Family history of diabetes mellitus: Secondary | ICD-10-CM | POA: Diagnosis not present

## 2015-02-21 DIAGNOSIS — I10 Essential (primary) hypertension: Secondary | ICD-10-CM | POA: Diagnosis not present

## 2015-02-21 DIAGNOSIS — R1310 Dysphagia, unspecified: Secondary | ICD-10-CM | POA: Diagnosis present

## 2015-02-21 DIAGNOSIS — N183 Chronic kidney disease, stage 3 (moderate): Secondary | ICD-10-CM | POA: Diagnosis not present

## 2015-02-21 DIAGNOSIS — R0600 Dyspnea, unspecified: Secondary | ICD-10-CM | POA: Diagnosis not present

## 2015-02-21 DIAGNOSIS — Z95810 Presence of automatic (implantable) cardiac defibrillator: Secondary | ICD-10-CM | POA: Diagnosis not present

## 2015-02-21 DIAGNOSIS — Z955 Presence of coronary angioplasty implant and graft: Secondary | ICD-10-CM | POA: Diagnosis not present

## 2015-02-21 DIAGNOSIS — Z8673 Personal history of transient ischemic attack (TIA), and cerebral infarction without residual deficits: Secondary | ICD-10-CM | POA: Diagnosis not present

## 2015-02-21 DIAGNOSIS — Z91041 Radiographic dye allergy status: Secondary | ICD-10-CM | POA: Diagnosis not present

## 2015-02-21 DIAGNOSIS — I25118 Atherosclerotic heart disease of native coronary artery with other forms of angina pectoris: Secondary | ICD-10-CM | POA: Diagnosis present

## 2015-02-21 LAB — CBC
HEMATOCRIT: 34.6 % — AB (ref 36.0–46.0)
HEMOGLOBIN: 11.7 g/dL — AB (ref 12.0–15.0)
MCH: 31 pg (ref 26.0–34.0)
MCHC: 33.8 g/dL (ref 30.0–36.0)
MCV: 91.8 fL (ref 78.0–100.0)
Platelets: 193 10*3/uL (ref 150–400)
RBC: 3.77 MIL/uL — ABNORMAL LOW (ref 3.87–5.11)
RDW: 16.1 % — AB (ref 11.5–15.5)
WBC: 6.3 10*3/uL (ref 4.0–10.5)

## 2015-02-21 LAB — BRAIN NATRIURETIC PEPTIDE: B Natriuretic Peptide: 386.1 pg/mL — ABNORMAL HIGH (ref 0.0–100.0)

## 2015-02-21 LAB — BASIC METABOLIC PANEL
Anion gap: 8 (ref 5–15)
BUN: 26 mg/dL — AB (ref 6–20)
CO2: 27 mmol/L (ref 22–32)
Calcium: 8.6 mg/dL — ABNORMAL LOW (ref 8.9–10.3)
Chloride: 104 mmol/L (ref 101–111)
Creatinine, Ser: 1.91 mg/dL — ABNORMAL HIGH (ref 0.44–1.00)
GFR calc Af Amer: 31 mL/min — ABNORMAL LOW (ref >60)
GFR, EST NON AFRICAN AMERICAN: 27 mL/min — AB (ref >60)
GLUCOSE: 91 mg/dL (ref 65–99)
POTASSIUM: 3.3 mmol/L — AB (ref 3.5–5.1)
Sodium: 139 mmol/L (ref 135–145)

## 2015-02-21 LAB — CK TOTAL AND CKMB (NOT AT ARMC)
CK, MB: 1.4 ng/mL (ref 0.5–5.0)
Relative Index: INVALID (ref 0.0–2.5)
Total CK: 63 U/L (ref 38–234)

## 2015-02-21 LAB — LIPID PANEL
CHOL/HDL RATIO: 3.1 ratio
CHOLESTEROL: 150 mg/dL (ref 0–200)
HDL: 48 mg/dL (ref >40)
LDL CALC: 89 mg/dL (ref 0–99)
TRIGLYCERIDES: 65 mg/dL (ref <150)
VLDL: 13 mg/dL (ref 0–40)

## 2015-02-21 LAB — TROPONIN I
TROPONIN I: 2.91 ng/mL — AB (ref <0.031)
Troponin I: 3.43 ng/mL (ref <0.031)

## 2015-02-21 LAB — GLUCOSE, CAPILLARY: Glucose-Capillary: 115 mg/dL — ABNORMAL HIGH (ref 65–99)

## 2015-02-21 MED ORDER — DIPHENHYDRAMINE HCL 25 MG PO CAPS
25.0000 mg | ORAL_CAPSULE | Freq: Once | ORAL | Status: AC
  Administered 2015-02-21: 25 mg via ORAL
  Filled 2015-02-21: qty 1

## 2015-02-21 MED ORDER — POTASSIUM CHLORIDE CRYS ER 20 MEQ PO TBCR
40.0000 meq | EXTENDED_RELEASE_TABLET | Freq: Once | ORAL | Status: AC
  Administered 2015-02-21: 40 meq via ORAL
  Filled 2015-02-21: qty 2

## 2015-02-21 MED ORDER — ISOSORBIDE MONONITRATE ER 30 MG PO TB24
30.0000 mg | ORAL_TABLET | Freq: Every day | ORAL | Status: DC
  Administered 2015-02-21 – 2015-02-28 (×8): 30 mg via ORAL
  Filled 2015-02-21 (×8): qty 1

## 2015-02-21 MED ORDER — FUROSEMIDE 10 MG/ML IJ SOLN
40.0000 mg | Freq: Once | INTRAMUSCULAR | Status: AC
  Administered 2015-02-21: 40 mg via INTRAVENOUS
  Filled 2015-02-21: qty 4

## 2015-02-21 NOTE — Progress Notes (Addendum)
SUBJECTIVE:  Still complains of SOB  OBJECTIVE:   Vitals:   Filed Vitals:   02/20/15 2045 02/20/15 2115 02/20/15 2227 02/21/15 0500  BP: 124/82 133/81 148/95 118/70  Pulse: 71 80 81   Temp:   98.2 F (36.8 C) 98.6 F (37 C)  TempSrc:   Oral   Resp: 22 20  21   Height:   5\' 5"  (1.651 m)   Weight:   91.3 kg (201 lb 4.5 oz) 90.447 kg (199 lb 6.4 oz)  SpO2: 100% 94% 95% 99%   I&O's:   Intake/Output Summary (Last 24 hours) at 02/21/15 0942 Last data filed at 02/21/15 [id number]  Gross per 24 hour  Intake    780 ml  Output   1200 ml  Net   -420 ml   TELEMETRY: Reviewed telemetry pt in NSR:     PHYSICAL EXAM General: Well developed, well nourished, in no acute distress Head: Eyes PERRLA, No xanthomas.   Normal cephalic and atramatic  Lungs:   Clear bilaterally to auscultation and percussion. Heart:   HRRR S1 S2 Pulses are 2+ & equal. Abdomen: Bowel sounds are positive, abdomen soft and non-tender without masses  Extremities:   No clubbing, cyanosis or edema.  DP +1 Neuro: Alert and oriented X 3. Psych:  Good affect, responds appropriately   LABS: Basic Metabolic Panel:  Recent Labs  02/20/15 1645 02/21/15 0402  NA 135 139  K 3.7 3.3*  CL 100* 104  CO2 25 27  GLUCOSE 125* 91  BUN 27* 26*  CREATININE 1.91* 1.91*  CALCIUM 8.4* 8.6*   Liver Function Tests:  Recent Labs  02/20/15 1645  AST 31  ALT 26  ALKPHOS 55  BILITOT 0.7  PROT 6.3*  ALBUMIN 2.8*   No results for input(s): LIPASE, AMYLASE in the last 72 hours. CBC:  Recent Labs  02/20/15 1645 02/21/15 0402  WBC 6.6 6.3  NEUTROABS 4.3  --   HGB 11.7* 11.7*  HCT 34.8* 34.6*  MCV 91.1 91.8  PLT 205 193   Cardiac Enzymes:  Recent Labs  02/20/15 2244 02/21/15 0402  CKTOTAL  --  63  CKMB  --  1.4  TROPONINI 4.20* 3.43*   BNP: Invalid input(s): POCBNP D-Dimer: No results for input(s): DDIMER in the last 72 hours. Hemoglobin A1C: No results for input(s): HGBA1C in the last 72  hours. Fasting Lipid Panel: No results for input(s): CHOL, HDL, LDLCALC, TRIG, CHOLHDL, LDLDIRECT in the last 72 hours. Thyroid Function Tests: No results for input(s): TSH, T4TOTAL, T3FREE, THYROIDAB in the last 72 hours.  Invalid input(s): FREET3 Anemia Panel: No results for input(s): VITAMINB12, FOLATE, FERRITIN, TIBC, IRON, RETICCTPCT in the last 72 hours. Coag Panel:   Lab Results  Component Value Date   INR 1.04 06/29/2014   INR 1.17 05/25/2014   INR 1.01 05/03/2013    RADIOLOGY: Dg Chest 2 View  02/20/2015  CLINICAL DATA:  Chest pain for 1 day.  Dyspnea. EXAM: CHEST  2 VIEW COMPARISON:  06/29/2014. FINDINGS: The cardiac silhouette is mildly enlarged and the aorta remains tortuous and partially calcified. The interstitial markings are mildly prominent with mild progression. Normal vascularity. No pleural fluid. Minimal thoracic spine degenerative changes. Stable left subclavian pacer and AICD leads. IMPRESSION: Interval mild cardiomegaly and mildly prominent interstitial markings. The interstitial prominence could be due to viral pneumonitis or minimal interstitial pulmonary edema. Electronically Signed   By: Claudie Revering M.D.   On: 02/20/2015 17:44    ASSESSMENT AND PLAN  1. Elevated troponin with episode of CP - POC trop of 1.94. H/O recent STEMI in [place]. Emergent cardiac catheterization at that time showed 100% blockage however not able to do any intervention per patient. Echocardiogram showed left ventricular function of 30-35%. We will get medical record. Suspect that trop elevation due to recent STEMI and trop trending downward. Will get records today. - Continue aspirin, Plavix, Coreg and statin. Not on ACE due to chronic kidney disease.  - Cycle troponin and EKGs.  - Add Imdur 30mg  daily. 2. ICM with systolic HF -  Post cath patient developed pulmonary edema, pneumonia and hemoptysis.  Echocardiogram with a 30-35%. She was diuresed and discharged.  Now with worsening SOB  and acute on chronic systolic CHF.  She put out 1200cc urine yesterday but minimally net neg out.  She is still SOB although lungs are clear and no LE edema.   - Will give Lasix 40mg  IV today - repeat 2D echo to reassess LVF - check BNP 3. Acute on chronic kidney disease - creatinine stable - Watch her renal function with diuresis.  4. Resolving pneumonia.  - She was discharged with clindamycin 300 mg 3 times a day for 4 days.  - Will resume for 3 more days.  5. VT s/p ablation March 2016 and May 2016  - Continue amio  6. HTN  - BP improved and stable. 7.  Hypokalemia - replete    Sueanne Margarita, MD  02/21/2015  9:42 AM

## 2015-02-21 NOTE — Progress Notes (Addendum)
Review of medical records from [facility]:  The patient presented with inferior wall STEMI. Emergent cath showed normal left main, diffuse luminal irregularity of the left anterior descending artery, patent stent in third of the patellar margin branch, diffuse irregularity in circumflex artery, patent stent in the mid RCA, proximal RPDA is very small caliber vessel with 100% total occlusion. Left ventricular end-diastolic pressure is 35 mmHg. 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 Plan made for medical management.    Patient developed a transient hypotension during cath that was treated with dopamine infusion for 10 minutes. Afterwards his oxygen saturation dropped to 86% and placed on BiPAP and treated with IV Lasix.   Before Patient was treated with IV Solu-Medrol and diphenhydramine for contrast allergy prophylaxis. Recent was admitted to ICU for acute respiratory failure that required BiPAP afterwards she was able to transfer back to floor. He said has elevated creatinine as well. Avoided nephrotoxic agent.  The following medications discontinued during admission and planned to restart by primary cardiologist: Imdur 30 mg daily, lisinopril 20 mg daily,  Aldactone 25 mg daily and potassium chloride 20 mg.   Alexcia Schools, [place]

## 2015-02-22 ENCOUNTER — Inpatient Hospital Stay (HOSPITAL_COMMUNITY): Payer: Medicare Other

## 2015-02-22 DIAGNOSIS — R0600 Dyspnea, unspecified: Secondary | ICD-10-CM

## 2015-02-22 LAB — BASIC METABOLIC PANEL
ANION GAP: 8 (ref 5–15)
BUN: 26 mg/dL — ABNORMAL HIGH (ref 6–20)
CALCIUM: 8.7 mg/dL — AB (ref 8.9–10.3)
CO2: 26 mmol/L (ref 22–32)
CREATININE: 1.97 mg/dL — AB (ref 0.44–1.00)
Chloride: 103 mmol/L (ref 101–111)
GFR calc Af Amer: 30 mL/min — ABNORMAL LOW (ref >60)
GFR, EST NON AFRICAN AMERICAN: 26 mL/min — AB (ref >60)
GLUCOSE: 94 mg/dL (ref 65–99)
POTASSIUM: 3.7 mmol/L (ref 3.5–5.1)
Sodium: 137 mmol/L (ref 135–145)

## 2015-02-22 LAB — GLUCOSE, CAPILLARY
GLUCOSE-CAPILLARY: 77 mg/dL (ref 65–99)
GLUCOSE-CAPILLARY: 127 mg/dL — AB (ref 65–99)
Glucose-Capillary: 123 mg/dL — ABNORMAL HIGH (ref 65–99)
Glucose-Capillary: 113 mg/dL — ABNORMAL HIGH (ref 65–99)

## 2015-02-22 MED ORDER — DIPHENHYDRAMINE HCL 25 MG PO CAPS
25.0000 mg | ORAL_CAPSULE | Freq: Every evening | ORAL | Status: DC | PRN
  Administered 2015-02-22 – 2015-02-28 (×6): 25 mg via ORAL
  Filled 2015-02-22 (×7): qty 1

## 2015-02-22 MED ORDER — FUROSEMIDE 40 MG PO TABS: 40.0000 mg | ORAL_TABLET | Freq: Every day | ORAL | Status: DC

## 2015-02-22 MED ORDER — ALUM & MAG HYDROXIDE-SIMETH 200-200-20 MG/5ML PO SUSP
15.0000 mL | Freq: Once | ORAL | Status: AC
  Administered 2015-02-23: 15 mL via ORAL
  Filled 2015-02-22: qty 30

## 2015-02-22 MED ORDER — FUROSEMIDE 20 MG PO TABS: 20.0000 mg | ORAL_TABLET | Freq: Every day | ORAL | Status: DC

## 2015-02-22 MED ORDER — FUROSEMIDE 10 MG/ML IJ SOLN
40.0000 mg | Freq: Once | INTRAMUSCULAR | Status: AC
  Administered 2015-02-22: 40 mg via INTRAVENOUS
  Filled 2015-02-22: qty 4

## 2015-02-22 NOTE — Progress Notes (Signed)
Patient states that her chest was hurting but only when she laid down. Patient stated that she is not sure whether it is cardiac or not but states that it does start when she lays down. PRN, once dose, of maalox ordered by RN per cardiac standing orders. Will reassess.

## 2015-02-22 NOTE — Progress Notes (Signed)
  Echocardiogram 2D Echocardiogram has been performed.  [name] Terry M 02/22/2015, 2:00 PM

## 2015-02-22 NOTE — Progress Notes (Signed)
Patient asked RN for some benadryl for the night. MD on call has been paged. Awaiting further orders

## 2015-02-22 NOTE — Progress Notes (Signed)
SUBJECTIVE:  Had chest pressure and SOB while in bed last night  OBJECTIVE:   Vitals:   Filed Vitals:   02/21/15 0500 02/21/15 1340 02/21/15 2014 02/22/15 0536  BP: 118/70 109/62 103/65 117/70  Pulse:  69 63 65  Temp: 98.6 F (37 C) 98.3 F (36.8 C) 98.6 F (37 C) 98.3 F (36.8 C)  TempSrc:  Oral Oral Oral  Resp: 21 22 20 20   Height:      Weight: 90.447 kg (199 lb 6.4 oz)   91.218 kg (201 lb 1.6 oz)  SpO2: 99% 100% 99% 97%   I&O's:   Intake/Output Summary (Last 24 hours) at 02/22/15 0911 Last data filed at 02/22/15 0845  Gross per 24 hour  Intake   1080 ml  Output   1920 ml  Net   -840 ml   TELEMETRY: Reviewed telemetry pt in NSR:     PHYSICAL EXAM General: Well developed, well nourished, in no acute distress Head: Eyes PERRLA, No xanthomas.   Normal cephalic and atramatic  Lungs:   Clear bilaterally to auscultation and percussion. Heart:   HRRR S1 S2 Pulses are 2+ & equal. Abdomen: Bowel sounds are positive, abdomen soft and non-tender without masses  Extremities:   No clubbing, cyanosis or edema.  DP +1 Neuro: Alert and oriented X 3. Psych:  Good affect, responds appropriately   LABS: Basic Metabolic Panel:  Recent Labs  02/21/15 0402 02/22/15 0231  NA 139 137  K 3.3* 3.7  CL 104 103  CO2 27 26  GLUCOSE 91 94  BUN 26* 26*  CREATININE 1.91* 1.97*  CALCIUM 8.6* 8.7*   Liver Function Tests:  Recent Labs  02/20/15 1645  AST 31  ALT 26  ALKPHOS 55  BILITOT 0.7  PROT 6.3*  ALBUMIN 2.8*   No results for input(s): LIPASE, AMYLASE in the last 72 hours. CBC:  Recent Labs  02/20/15 1645 02/21/15 0402  WBC 6.6 6.3  NEUTROABS 4.3  --   HGB 11.7* 11.7*  HCT 34.8* 34.6*  MCV 91.1 91.8  PLT 205 193   Cardiac Enzymes:  Recent Labs  02/20/15 2244 02/21/15 0402 02/21/15 1042  CKTOTAL  --  63  --   CKMB  --  1.4  --   TROPONINI 4.20* 3.43* 2.91*   BNP: Invalid input(s): POCBNP D-Dimer: No results for input(s): DDIMER in the last 72  hours. Hemoglobin A1C: No results for input(s): HGBA1C in the last 72 hours. Fasting Lipid Panel:  Recent Labs  02/21/15 1042  CHOL 150  HDL 48  LDLCALC 89  TRIG 65  CHOLHDL 3.1   Thyroid Function Tests: No results for input(s): TSH, T4TOTAL, T3FREE, THYROIDAB in the last 72 hours.  Invalid input(s): FREET3 Anemia Panel: No results for input(s): VITAMINB12, FOLATE, FERRITIN, TIBC, IRON, RETICCTPCT in the last 72 hours. Coag Panel:   Lab Results  Component Value Date   INR 1.04 06/29/2014   INR 1.17 05/25/2014   INR 1.01 05/03/2013    RADIOLOGY: Dg Chest 2 View  02/20/2015  CLINICAL DATA:  Chest pain for 1 day.  Dyspnea. EXAM: CHEST  2 VIEW COMPARISON:  06/29/2014. FINDINGS: The cardiac silhouette is mildly enlarged and the aorta remains tortuous and partially calcified. The interstitial markings are mildly prominent with mild progression. Normal vascularity. No pleural fluid. Minimal thoracic spine degenerative changes. Stable left subclavian pacer and AICD leads. IMPRESSION: Interval mild cardiomegaly and mildly prominent interstitial markings. The interstitial prominence could be due to viral pneumonitis  or minimal interstitial pulmonary edema. Electronically Signed   By: Claudie Revering M.D.   On: 02/20/2015 17:44    ASSESSMENT AND PLAN  1. Elevated troponin with episode of CP - POC trop of 1.94. H/O recent STEMI in [place]. Emergent cardiac catheterization at that time showed 100% stenosis of prox PDA, patent stent in RCA with otherwise normal coronary arteries.  No PCI done. Echocardiogram showed left ventricular function of 30-35%. Suspect that trop elevation due to recent STEMI and trop trending downward.  - Continue aspirin, Plavix, Coreg, long acting nitrate and statin.  2. ICM with acute on chronic systolic HF - Post cath patient developed pulmonary edema, pneumonia and hemoptysis. Echocardiogram with a 30-35%. She is net neg 1260cc.  BNP mildly elevated yesterday.   She is still complaining of SOB despite clear lungs and no edema.  ? Whether there is an anxiety component or some GERD.  - Will give Lasix 40mg  IV today - repeat 2D echo to reassess LVF - continue BB and long acting nitrates.  BP too soft to add hydralazine at this time.  No ACE I due to CKD. 3. Acute on chronic kidney disease - creatinine stable but bumped slightly (1.91-->1.97) but baseline is around 2. - Watch her renal function with diuresis.  4. Resolving pneumonia.  - She was discharged with clindamycin 300 mg 3 times a day for 4 days.  - Will resume for 21more days.  5. VT s/p ablation March 2016 and May 2016  - Continue amio  6. HTN  - BP improved and stable. 7. Hypokalemia - repleted 8.  Dyslipidemia - Lipitor increased to 80mg  daily due to LDL of 81 (goal < 70)   TURNER,TRACI R, MD  02/22/2015  9:11 AM

## 2015-02-22 NOTE — Plan of Care (Signed)
Problem: Phase I Progression Outcomes Goal: Anginal pain relieved Outcome: Progressing Pts chest pain is relieved with sublingual nitro

## 2015-02-22 NOTE — Plan of Care (Signed)
Problem: Phase I Progression Outcomes Goal: Hemodynamically stable Outcome: Completed/Met Date Met:  02/22/15 BP and pulse WNL.

## 2015-02-23 ENCOUNTER — Inpatient Hospital Stay (HOSPITAL_COMMUNITY): Payer: Medicare Other

## 2015-02-23 LAB — D-DIMER, QUANTITATIVE (NOT AT ARMC): D DIMER QUANT: 3.67 ug{FEU}/mL — AB (ref 0.00–0.50)

## 2015-02-23 LAB — BASIC METABOLIC PANEL
ANION GAP: 9 (ref 5–15)
BUN: 23 mg/dL — ABNORMAL HIGH (ref 6–20)
CALCIUM: 9 mg/dL (ref 8.9–10.3)
CHLORIDE: 106 mmol/L (ref 101–111)
CO2: 24 mmol/L (ref 22–32)
Creatinine, Ser: 1.93 mg/dL — ABNORMAL HIGH (ref 0.44–1.00)
GFR calc Af Amer: 31 mL/min — ABNORMAL LOW (ref >60)
GFR calc non Af Amer: 26 mL/min — ABNORMAL LOW (ref >60)
GLUCOSE: 68 mg/dL (ref 65–99)
POTASSIUM: 3.8 mmol/L (ref 3.5–5.1)
Sodium: 139 mmol/L (ref 135–145)

## 2015-02-23 LAB — GLUCOSE, CAPILLARY
GLUCOSE-CAPILLARY: 132 mg/dL — AB (ref 65–99)
GLUCOSE-CAPILLARY: 99 mg/dL (ref 65–99)
GLUCOSE-CAPILLARY: 90 mg/dL (ref 65–99)
Glucose-Capillary: 70 mg/dL (ref 65–99)

## 2015-02-23 MED ORDER — FUROSEMIDE 10 MG/ML IJ SOLN
40.0000 mg | Freq: Once | INTRAMUSCULAR | Status: AC
  Administered 2015-02-23: 40 mg via INTRAVENOUS
  Filled 2015-02-23: qty 4

## 2015-02-23 MED ORDER — ALUM & MAG HYDROXIDE-SIMETH 200-200-20 MG/5ML PO SUSP
30.0000 mL | Freq: Four times a day (QID) | ORAL | Status: DC | PRN
  Administered 2015-02-23 – 2015-02-28 (×13): 30 mL via ORAL
  Filled 2015-02-23 (×13): qty 30

## 2015-02-23 MED ORDER — TRAMADOL HCL 50 MG PO TABS
50.0000 mg | ORAL_TABLET | Freq: Four times a day (QID) | ORAL | Status: DC | PRN
  Administered 2015-02-24 – 2015-02-28 (×8): 50 mg via ORAL
  Filled 2015-02-23 (×9): qty 1

## 2015-02-23 MED ORDER — TECHNETIUM TO 99M ALBUMIN AGGREGATED
4.1000 | Freq: Once | INTRAVENOUS | Status: AC | PRN
  Administered 2015-02-23: 4 via INTRAVENOUS

## 2015-02-23 MED ORDER — HYDRALAZINE HCL 10 MG PO TABS
10.0000 mg | ORAL_TABLET | Freq: Three times a day (TID) | ORAL | Status: DC
  Administered 2015-02-23 – 2015-02-28 (×15): 10 mg via ORAL
  Filled 2015-02-23 (×15): qty 1

## 2015-02-23 MED ORDER — TECHNETIUM TC 99M DIETHYLENETRIAME-PENTAACETIC ACID: 31.2000 | Freq: Once | INTRAVENOUS | Status: DC | PRN

## 2015-02-23 NOTE — Progress Notes (Signed)
Subjective: occ episode of chest pain.  Objective: Vital signs in last 24 hours: Temp:  [98.3 F (36.8 C)-98.7 F (37.1 C)] 98.3 F (36.8 C) (12/01 0500) Pulse Rate:  [68-70] 68 (12/01 0500) Resp:  [18-20] 18 (12/01 0500) BP: (121-137)/(66-80) 121/68 mmHg (12/01 0500) SpO2:  [99 %-100 %] 99 % (12/01 0500) Weight:  [199 lb 12.8 oz (90.629 kg)] 199 lb 12.8 oz (90.629 kg) (12/01 0500) Weight change: -1 lb 4.8 oz (-0.59 kg) Last BM Date: 02/22/15 Intake/Output from previous day: 11/30 0701 - 12/01 0700 In: 1200 [P.O.:1200] Out: 1800 [Urine:1800] Intake/Output this shift:    PE: General:Pleasant affect, NAD Skin:Warm and dry, brisk capillary refill HEENT:normocephalic, sclera clear, mucus membranes moist Heart:S1S2 RRR without murmur, gallup, rub or click Lungs:clear without rales, rhonchi, or wheezes [id number], non tender, + BS, do not palpate liver spleen or masses Ext:no lower ext edema, 2+ pedal pulses, 2+ radial pulses Neuro:alert and oriented, MAE, follows commands, + facial symmetry Tele:  SR with PVCs.   Lab Results:  Recent Labs  02/20/15 1645 02/21/15 0402  WBC 6.6 6.3  HGB 11.7* 11.7*  HCT 34.8* 34.6*  PLT 205 193   BMET  Recent Labs  02/22/15 0231 02/23/15 0412  NA 137 139  K 3.7 3.8  CL 103 106  CO2 26 24  GLUCOSE 94 68  BUN 26* 23*  CREATININE 1.97* 1.93*  CALCIUM 8.7* 9.0    Recent Labs  02/21/15 0402 02/21/15 1042  TROPONINI 3.43* 2.91*    Lab Results  Component Value Date   CHOL 150 02/21/2015   HDL 48 02/21/2015   LDLCALC 89 02/21/2015   TRIG 65 02/21/2015   CHOLHDL 3.1 02/21/2015   Lab Results  Component Value Date   HGBA1C 5.9* 05/20/2014     Lab Results  Component Value Date   TSH 12.116* 05/19/2014    Hepatic Function Panel  Recent Labs  02/20/15 1645  PROT 6.3*  ALBUMIN 2.8*  AST 31  ALT 26  ALKPHOS 55  BILITOT 0.7    Recent Labs  02/21/15 1042  CHOL 150   No results for input(s):  PROTIME in the last 72 hours.     Studies/Results: Dg Chest 2 View  02/22/2015  CLINICAL DATA:  Shortness of breath today, myocardial infarction 10 days ago EXAM: CHEST  2 VIEW COMPARISON:  02/20/2015 FINDINGS: Mild cardiac enlargement stable. Aortic arch calcifications stable. Two lead cardiac pacer unchanged. Vascular pattern within normal limits. No significant consolidation. Possible trace left pleural effusion. IMPRESSION: No significant acute abnormalities. Electronically Signed   By: Skipper Cliche M.D.   On: 02/22/2015 11:53    ECHO: Study Conclusions  - Left ventricle: Wall thickness was increased in a pattern of moderate LVH. Systolic function was moderately reduced. The estimated ejection fraction was in the range of 35% to 40%. - Mitral valve: There was moderate regurgitation. - Left atrium: The atrium was severely dilated. - Pulmonary arteries: Systolic pressure was mildly increased. PA peak pressure: 31 mm Hg (S). No pericardial effusion.  From 04/2014 EF now up from 30-35%, but Lt atrium now severely dilated was mildly dilated, though PA pressure improved down from 43 mmHG  TR has improved.     Medications: I have reviewed the patient's current medications. Scheduled Meds: . amiodarone  200 mg Oral Daily  . aspirin EC  81 mg Oral Daily  . atorvastatin  80 mg Oral q1800  . carvedilol  6.25  mg Oral BID WC  . clindamycin  300 mg Oral 3 times per day  . clopidogrel  75 mg Oral QHS  . glipiZIDE  2.5 mg Oral Q breakfast  . heparin  5,000 Units Subcutaneous 3 times per day  . isosorbide mononitrate  30 mg Oral Daily  . pantoprazole  40 mg Oral Daily   Continuous Infusions:  PRN Meds:.acetaminophen, diphenhydrAMINE, nitroGLYCERIN, ondansetron (ZOFRAN) IV  Assessment/Plan: 1. Elevated troponin with episode of CP - POC trop of 1.94 on arrival with STEMI. H/O recent STEMI in [place]. Emergent cardiac catheterization at that time showed 100% stenosis of prox  PDA, patent stent in RCA with otherwise normal coronary arteries. No PCI done. Echocardiogram showed left ventricular function of 30-35% in Feb. Now presenting back with CP and elevated trop.   Suspect that trop elevation due to recent STEMI and has been trending down since admission. - Continue aspirin, Plavix, Coreg, long acting nitrate and statin.  - Begin cardiac rehab.   2. ICM with acute on chronic systolic HF - Post cath patient developed pulmonary edema, pneumonia and hemoptysis. Echocardiogram with a 30-35%. She is net neg now 2220 cc. BNP mildly elevated yesterday. She is still complaining of SOB despite clear lungs and no edema. ? Whether there is an anxiety component or some GERD.  - Given  Lasix 40mg  IV 02/22/15 and -600. SOB has improved.  Will give another dose of 40mg  IV today. - repeat 2D echo  with slight improvement of EF 35-40% with moderate MR - continue BB and long acting nitrates. No ACE I due to CKD. - add hydralazine 10mg  TID for afterload reduction. - will check a d-dimer given persistent CP/SOB with clear lungs and chest xray.  3. Acute on chronic kidney disease - creatinine stable but bumped slightly (1.91-->1.97-->1.93) but baseline is around 2. - Watch her renal function with diuresis.   4. Resolving pneumonia.  - She was discharged with clindamycin 300 mg 3 times a day for 4 days.  - Will resume for 71more days. stop 02/24/15  5. VT s/p ablation March 2016 and May 2016  - Continue amio  -has ICD  6. HTN  - BP improved and stable.  7. Hypokalemia - repleted  8. Dyslipidemia - Lipitor increased to 80mg  daily due to LDL of 81 (goal < 70)  9. DM-2 --glucose 70 -127 on glipizide   LOS: 2 days   Time spent with pt. :15 minutes. [facility] R  Nurse Practitioner Certified Pager [phone] or after 5pm and on weekends call [phone] 02/23/2015, 8:02 AM  Patient seen and examined with Cecilie Kicks, NP. We discussed all aspects of the encounter.  I agree with the assessment and plan as stated above.  Patient continues to have SOB and occasional CP with lying supine.  Her lungs are clear and no edema on exam.  Chest xray is clear.  Will check a D-Dimer.  Start hydralazine 10mg  TID for afterload reduction.  Ambulate with cardiac rehab.  Will given another dose of Lasix 40mg  IV today since she felt better after diuresis and renal function is stable.  Signed: Fransico Him, MD [facility] 02/23/2015

## 2015-02-23 NOTE — Progress Notes (Signed)
RN spoke with patient this am- no findings of chest pain. Patient states that she slept just fine. Will pass on to day RN concerning pain.

## 2015-02-23 NOTE — Progress Notes (Signed)
RN checked on patient and patient stated that she was feeling just fine after maalox. Patient has fallen back asleep at this time. Will continue to monitor

## 2015-02-23 NOTE — Progress Notes (Signed)
Pt walked independently in am and now is in nuc med. Will try back later. Yves Dill CES, ACSM 1:47 PM 02/23/2015

## 2015-02-24 LAB — BASIC METABOLIC PANEL
Anion gap: 15 (ref 5–15)
Anion gap: 8 (ref 5–15)
BUN: 25 mg/dL — AB (ref 6–20)
BUN: 24 mg/dL — AB (ref 6–20)
CALCIUM: 9 mg/dL (ref 8.9–10.3)
CHLORIDE: 107 mmol/L (ref 101–111)
CHLORIDE: 104 mmol/L (ref 101–111)
CO2: 17 mmol/L — AB (ref 22–32)
CO2: 26 mmol/L (ref 22–32)
CREATININE: 2.15 mg/dL — AB (ref 0.44–1.00)
CREATININE: 2.2 mg/dL — AB (ref 0.44–1.00)
Calcium: 9.2 mg/dL (ref 8.9–10.3)
GFR calc Af Amer: 27 mL/min — ABNORMAL LOW (ref >60)
GFR calc Af Amer: 26 mL/min — ABNORMAL LOW (ref >60)
GFR calc non Af Amer: 23 mL/min — ABNORMAL LOW (ref >60)
GFR calc non Af Amer: 23 mL/min — ABNORMAL LOW (ref >60)
GLUCOSE: 112 mg/dL — AB (ref 65–99)
Glucose, Bld: 58 mg/dL — ABNORMAL LOW (ref 65–99)
POTASSIUM: 3.8 mmol/L (ref 3.5–5.1)
Potassium: 5.6 mmol/L — ABNORMAL HIGH (ref 3.5–5.1)
SODIUM: 139 mmol/L (ref 135–145)
Sodium: 138 mmol/L (ref 135–145)

## 2015-02-24 LAB — GLUCOSE, CAPILLARY
GLUCOSE-CAPILLARY: 132 mg/dL — AB (ref 65–99)
GLUCOSE-CAPILLARY: 91 mg/dL (ref 65–99)
GLUCOSE-CAPILLARY: 105 mg/dL — AB (ref 65–99)
Glucose-Capillary: 71 mg/dL (ref 65–99)

## 2015-02-24 MED ORDER — GI COCKTAIL ~~LOC~~
30.0000 mL | Freq: Once | ORAL | Status: AC
  Administered 2015-02-24: 30 mL via ORAL
  Filled 2015-02-24: qty 30

## 2015-02-24 MED ORDER — FUROSEMIDE 20 MG PO TABS: 20.0000 mg | ORAL_TABLET | Freq: Every day | ORAL | Status: DC

## 2015-02-24 MED ORDER — PANTOPRAZOLE SODIUM 40 MG PO TBEC
40.0000 mg | DELAYED_RELEASE_TABLET | Freq: Two times a day (BID) | ORAL | Status: DC
Start: 2015-02-24 — End: 2015-02-28
  Administered 2015-02-24 – 2015-02-28 (×8): 40 mg via ORAL
  Filled 2015-02-24 (×8): qty 1

## 2015-02-24 MED ORDER — FUROSEMIDE 40 MG PO TABS
40.0000 mg | ORAL_TABLET | Freq: Every day | ORAL | Status: DC
Start: 2015-02-24 — End: 2015-02-28
  Administered 2015-02-24 – 2015-02-28 (×5): 40 mg via ORAL
  Filled 2015-02-24 (×5): qty 1

## 2015-02-24 MED ORDER — SODIUM POLYSTYRENE SULFONATE 15 GM/60ML PO SUSP: 15.0000 g | Freq: Once | ORAL | Status: DC

## 2015-02-24 NOTE — Care Management Note (Addendum)
Case Management Note  Patient Details  Name: [name] Terry MRN: [id number] Date of Birth: 04/01/1950  Subjective/Objective:      Pt admitted for elevated troponin.              Action/Plan: No needs identified from CM at this time. Plan for d/c home once stable.    Expected Discharge Date:                  Expected Discharge Plan:  Home/Self Care  In-House Referral:  NA  Discharge planning Services  CM Consult  Post Acute Care Choice:  NA Choice offered to:  NA  DME Arranged:  N/A DME Agency:  NA  HH Arranged:  NA HH Agency:  NA  Status of Service:  Completed, signed off  Medicare Important Message Given:  Yes Date Medicare IM Given:    Medicare IM give by:    Date Additional Medicare IM Given:    Additional Medicare Important Message give by:     If discussed at [place] of Stay Meetings, dates discussed:    Additional Comments: 1021 02-28-15 Jacqlyn Krauss, RN,BSN [phone]  Pt with continued abdominal pain. GI consulted. Unsure of disposition @ this time. No needs identified will continue to monitor.  Bethena Roys, RN 02/24/2015, 3:03 PM

## 2015-02-24 NOTE — Evaluation (Signed)
Occupational Therapy Evaluation Patient Details Name: [name] Terry MRN: [id number] DOB: [birth year] Today's Date: 02/24/2015    History of Present Illness [name] Terry is a 64 y.o. female with a history of Recent STEMI, ICM, CAD, HTN, HLD, DM, PMR, CRI, and history of VT rs/p ablation March 2016 and May 2016 who presented to [facility] ED for evaluation of SOB and chest pain.    Clinical Impression   Pt is functioning at a modified independent level. Instructed and gave pt handout in energy conservation.  No further OT needs.    Follow Up Recommendations  No OT follow up    Equipment Recommendations  None recommended by OT    Recommendations for Other Services       Precautions / Restrictions Precautions Precautions: None Restrictions Weight Bearing Restrictions: No      Mobility Bed Mobility Overal bed mobility: Independent                Transfers Overall transfer level: Modified independent                    Balance Overall balance assessment: No apparent balance deficits (not formally assessed)                                          ADL Overall ADL's : Modified independent                                       General ADL Comments: Educated pt in breathing techniques and energy conservation.  Recommended pt use shower seat she has at home from her mother.     Vision     Perception     Praxis      Pertinent Vitals/Pain Pain Assessment: No/denies pain Pain Score: 5  Pain Location: sternal Pain Descriptors / Indicators: Dull Pain Intervention(s): Monitored during session     Hand Dominance Right   Extremity/Trunk Assessment Upper Extremity Assessment Upper Extremity Assessment: Overall WFL for tasks assessed   Lower Extremity Assessment Lower Extremity Assessment: Overall WFL for tasks assessed   Cervical / Trunk Assessment Cervical / Trunk Assessment: Normal   Communication  Communication Communication: No difficulties   Cognition Arousal/Alertness: Awake/alert Behavior During Therapy: Anxious Overall Cognitive Status: Within Functional Limits for tasks assessed                     General Comments       Exercises       Shoulder Instructions      Home Living Family/patient expects to be discharged to:: Private residence Living Arrangements: Alone Available Help at Discharge: Family;Available PRN/intermittently Type of Home: House Home Access: Stairs to enter [employer] of Steps: 3 Entrance Stairs-Rails: Can reach both Home Layout: One level     Bathroom Shower/Tub: Occupational psychologist: Standard     Home Equipment: Shower seat          Prior Functioning/Environment Level of Independence: Independent             OT Diagnosis: Generalized weakness   OT Problem List:     OT Treatment/Interventions:      OT Goals(Current goals can be found in the care plan section)    OT Frequency:     Barriers  to D/C:            Co-evaluation              End of Session    Activity Tolerance: Patient tolerated treatment well Patient left: in chair;with call bell/phone within reach   Time: 1403-1430 OT Time Calculation (min): 27 min Charges:  OT General Charges $OT Visit: 1 Procedure OT Evaluation $Initial OT Evaluation Tier I: 1 Procedure G-Codes:    Malka So 02/24/2015, 2:33 PM [phone]

## 2015-02-24 NOTE — Progress Notes (Signed)
CARDIAC REHAB PHASE I   PRE:  Rate/Rhythm: 70 SR  BP:  Sitting: 128/75        SaO2: 100 RA  MODE:  Ambulation: 550 ft   POST:  Rate/Rhythm: 95 paced  BP:  Sitting: 136/77         SaO2: 100 RA  Pt ambulated 550 ft on RA, handheld assist, steady gait, tolerated well.  Pt c/o of mild DOE (states this is normal for her), denies cp, dizziness, declined rest stop. Completed MI/CHF education.  Reviewed risk factors, anti-platelet therapy, activity restrictions, ntg, exercise, heart healthy diet, carb counting, portion control, s/s heart failure, CHF booklet and zone tool, daily weights, sodium restrictions and phase 2 cardiac rehab. Pt verbalized understanding, receptive to education, states she is forgetful of details. Pt agrees to phase 2 cardiac rehab referral, will send to [facility] per pt request as she does not wish to go to [facility]. Pt states she feels anxious, states she gets "upset talking about this stuff." Pt to bed per pt request after walk, call bell within reach. Will follow if pt does not discharge today.  [id number] Lenna Sciara, RN, BSN 02/24/2015 11:35 AM

## 2015-02-24 NOTE — Care Management Important Message (Signed)
Important Message  Patient Details  Name: [name] Terry MRN: [id number] Date of Birth: [birth year]   Medicare Important Message Given:  Yes    Lijah Bourque P Semone Orlov 02/24/2015, 2:05 PM

## 2015-02-24 NOTE — Evaluation (Signed)
Physical Therapy Evaluation & discharge Patient Details Name: [name] Terry MRN: [id number] DOB: 06-Aug-1950 Today's Date: 02/24/2015   History of Present Illness  [name] Terry is a 64 y.o. female with a history of Recent STEMI, ICM, CAD, HTN, HLD, DM, PMR, CRI, and history of VT rs/p ablation March 2016 and May 2016 who presented to [facility] ED for evaluation of SOB and chest pain.   Clinical Impression  Pt able to ambulate in hallway with MOD I and manage stairs as well with rest breaks. Pt c/o SOB with o2 100% on room air.  Pt is unsure if she will be doing cardiac rehab after dc.  Educated on how to increase her mobility and energy conservation.  No further PT needs and will d/c.    Follow Up Recommendations  (cardiac rehab)    Equipment Recommendations  None recommended by PT    Recommendations for Other Services       Precautions / Restrictions Precautions Precautions: None Restrictions Weight Bearing Restrictions: No      Mobility  Bed Mobility Overal bed mobility: Independent                Transfers Overall transfer level: Modified independent                  Ambulation/Gait Ambulation/Gait assistance: Modified independent (Device/Increase time) Ambulation Distance (Feet): 300 Feet Assistive device: None Gait Pattern/deviations: Step-through pattern Gait velocity: decreased Gait velocity interpretation: Below normal speed for age/gender General Gait Details: Took 1 standing rest break during gait.  1/4 on dyspnea scale with o2 100% on RA.  Stairs Stairs: Yes Stairs assistance: Modified independent (Device/Increase time) Stair Management: One rail Right;Step to pattern;Alternating pattern;Forwards Number of Stairs: 10 General stair comments: Was only going to practice 3 steps, but pt wanted to do flight.  step through pattern with ascent and step to pattern with descent.  2 rest breaks with stairs.  Wheelchair Mobility    Modified Rankin (Stroke  Patients Only)       Balance Overall balance assessment: No apparent balance deficits (not formally assessed)                                           Pertinent Vitals/Pain Pain Assessment: 0-10 Pain Score: 5  Pain Location: sternal Pain Descriptors / Indicators: Dull Pain Intervention(s): Monitored during session    Home Living Family/patient expects to be discharged to:: Private residence Living Arrangements: Alone Available Help at Discharge: Family Type of Home: House Home Access: Stairs to enter Entrance Stairs-Rails: Can reach both Technical brewer of Steps: 3 Home Layout: One level Home Equipment: None      Prior Function Level of Independence: Independent               Hand Dominance   Dominant Hand: Right    Extremity/Trunk Assessment   Upper Extremity Assessment: Overall WFL for tasks assessed           Lower Extremity Assessment: Overall WFL for tasks assessed      Cervical / Trunk Assessment: Normal  Communication   Communication: No difficulties  Cognition Arousal/Alertness: Awake/alert Behavior During Therapy: WFL for tasks assessed/performed Overall Cognitive Status: Within Functional Limits for tasks assessed                      General Comments General comments (skin  integrity, edema, etc.): Pt educated on energy conservation and how to slowly increase her activity tolerance.      Exercises        Assessment/Plan    PT Assessment Patent does not need any further PT services  PT Diagnosis Difficulty walking   PT Problem List    PT Treatment Interventions     PT Goals (Current goals can be found in the Care Plan section) Acute Rehab PT Goals PT Goal Formulation: All assessment and education complete, DC therapy    Frequency     Barriers to discharge        Co-evaluation               End of Session   Activity Tolerance: Patient tolerated treatment well Patient left: in  chair Nurse Communication: Mobility status         Time: [id number] PT Time Calculation (min) (ACUTE ONLY): 19 min   Charges:   PT Evaluation $Initial PT Evaluation Tier I: 1 Procedure     PT G Codes:        Shariff Lasky LUBECK 02/24/2015, 1:18 PM

## 2015-02-24 NOTE — Progress Notes (Signed)
SUBJECTIVE:  Complains of burning epigastric discomfort going up into her throat  OBJECTIVE:   Vitals:   Filed Vitals:   02/23/15 1107 02/23/15 1421 02/23/15 2100 02/24/15 0500  BP: 109/71 111/60 120/79 118/63  Pulse:  78 77 63  Temp:  98.2 F (36.8 C) 98.3 F (36.8 C) 98.1 F (36.7 C)  TempSrc:  Oral Oral Oral  Resp:   18 18  Height:      Weight:    90.447 kg (199 lb 6.4 oz)  SpO2:  100% 100% 100%   I&O's:   Intake/Output Summary (Last 24 hours) at 02/24/15 0849 Last data filed at 02/24/15 0600  Gross per 24 hour  Intake   1022 ml  Output   2175 ml  Net  -1153 ml   TELEMETRY: Reviewed telemetry pt in NSR:     PHYSICAL EXAM General: Well developed, well nourished, in no acute distress Head: Eyes PERRLA, No xanthomas.   Normal cephalic and atramatic  Lungs:   Clear bilaterally to auscultation and percussion. Heart:   HRRR S1 S2 Pulses are 2+ & equal. 2/6 SM at LLSB to apex Abdomen: Bowel sounds are positive, abdomen soft and non-tender without masses  Extremities:   No clubbing, cyanosis or edema.  DP +1 Neuro: Alert and oriented X 3. Psych:  Good affect, responds appropriately   LABS: Basic Metabolic Panel:  Recent Labs  02/23/15 0412 02/24/15 0532  NA 139 139  K 3.8 5.6*  CL 106 107  CO2 24 17*  GLUCOSE 68 58*  BUN 23* 25*  CREATININE 1.93* 2.15*  CALCIUM 9.0 9.0   Liver Function Tests: No results for input(s): AST, ALT, ALKPHOS, BILITOT, PROT, ALBUMIN in the last 72 hours. No results for input(s): LIPASE, AMYLASE in the last 72 hours. CBC: No results for input(s): WBC, NEUTROABS, HGB, HCT, MCV, PLT in the last 72 hours. Cardiac Enzymes:  Recent Labs  02/21/15 1042  TROPONINI 2.91*   BNP: Invalid input(s): POCBNP D-Dimer:  Recent Labs  02/23/15 1010  DDIMER 3.67*   Hemoglobin A1C: No results for input(s): HGBA1C in the last 72 hours. Fasting Lipid Panel:  Recent Labs  02/21/15 1042  CHOL 150  HDL 48  LDLCALC 89  TRIG 65    CHOLHDL 3.1   Thyroid Function Tests: No results for input(s): TSH, T4TOTAL, T3FREE, THYROIDAB in the last 72 hours.  Invalid input(s): FREET3 Anemia Panel: No results for input(s): VITAMINB12, FOLATE, FERRITIN, TIBC, IRON, RETICCTPCT in the last 72 hours. Coag Panel:   Lab Results  Component Value Date   INR 1.04 06/29/2014   INR 1.17 05/25/2014   INR 1.01 05/03/2013    RADIOLOGY: Dg Chest 2 View  02/22/2015  CLINICAL DATA:  Shortness of breath today, myocardial infarction 10 days ago EXAM: CHEST  2 VIEW COMPARISON:  02/20/2015 FINDINGS: Mild cardiac enlargement stable. Aortic arch calcifications stable. Two lead cardiac pacer unchanged. Vascular pattern within normal limits. No significant consolidation. Possible trace left pleural effusion. IMPRESSION: No significant acute abnormalities. Electronically Signed   By: Skipper Cliche M.D.   On: 02/22/2015 11:53   Dg Chest 2 View  02/20/2015  CLINICAL DATA:  Chest pain for 1 day.  Dyspnea. EXAM: CHEST  2 VIEW COMPARISON:  06/29/2014. FINDINGS: The cardiac silhouette is mildly enlarged and the aorta remains tortuous and partially calcified. The interstitial markings are mildly prominent with mild progression. Normal vascularity. No pleural fluid. Minimal thoracic spine degenerative changes. Stable left subclavian pacer and AICD leads.  IMPRESSION: Interval mild cardiomegaly and mildly prominent interstitial markings. The interstitial prominence could be due to viral pneumonitis or minimal interstitial pulmonary edema. Electronically Signed   By: Claudie Revering M.D.   On: 02/20/2015 17:44   Nm Pulmonary Perf And Vent  02/23/2015  CLINICAL DATA:  Short of breath following cardiac catheterization. Chest pain. EXAM: NUCLEAR MEDICINE VENTILATION - PERFUSION LUNG SCAN TECHNIQUE: Ventilation images were obtained in multiple projections using inhaled aerosol Tc-72m DTPA. Perfusion images were obtained in multiple projections after intravenous injection  of Tc-75m MAA. RADIOPHARMACEUTICALS:  31.3 Technetium-40m DTPA aerosol inhalation and 4.1 Technetium-20m MAA IV COMPARISON:  Radiograph 02/22/2015 FINDINGS: Ventilation: No focal ventilation defect. Perfusion: No wedge shaped peripheral perfusion defects to suggest acute pulmonary embolism. IMPRESSION: No evidence of pulmonary embolism. Electronically Signed   By: Suzy Bouchard M.D.   On: 02/23/2015 14:28    Assessment/Plan: 1. Elevated troponin with episode of CP - POC trop of 1.94 on arrival with STEMI. H/O recent STEMI in [place]. Emergent cardiac catheterization at that time showed 100% stenosis of prox PDA, patent stent in RCA with otherwise normal coronary arteries. No PCI done. Echocardiogram showed left ventricular function of 30-35% in Feb. Now presenting back with CP and elevated trop. Suspect that trop elevation due to recent STEMI and has been trending down since admission.  D-Dimer elevated but VQ scan negative for PE.  She had another episode of chest discomfort last night.  These episodes appear to happen when she is supine and she says that it starts in epigastrium and burns up into her chest and throat.  I think she is have GERD symptoms.  I will increase protonix to BID for now.   - Continue aspirin, Plavix, Coreg, long acting nitrate and statin.  - Continue cardiac rehab.   2. ICM with acute on chronic systolic HF - Post cath patient developed pulmonary edema, pneumonia and hemoptysis. Echocardiogram with a 30-35%. She is net neg now 3373cc. BNP trending downward. She is still complaining of SOB despite clear lungs and no edema. ? Whether there is an anxiety component or some GERD.  SHe has been walking in the halls with less SOB over the past 24 hours.  - change Lasix to PO - 2D echo with slight improvement of EF 35-40% with moderate MR - continue BB and long acting nitrates. No ACE I due to CKD. - Continue hydralazine 10mg  TID for afterload reduction.  3. Acute on  chronic kidney disease - creatinine has bumped slightly (1.91-->1.97-->1.93-->2.15) but baseline is around 2. - change Lasix to PO  4. Resolving pneumonia.  - Last dose of Clindamycin today  5. VT s/p ablation March 2016 and May 2016  - Continue amio  -has ICD  6. HTN  - BP improved and stable.  7. Hypokalemia - repleted and now K elevated but sample hemolyzed - repeat BMET  8. Dyslipidemia - Lipitor increased to 80mg  daily due to LDL of 81 (goal < 70)  9. DM-2 --glucose 70 -127 on glipizide  Patients SOB as improved and I think her chest discomfort is related to GERD.  Will review cath films from [facility] and if no further intervention needed will d/c home later today.  She will need followup with her PCP as well as cardiology in the next 1-2 weeks.  Sueanne Margarita, MD  02/24/2015  8:49 AM

## 2015-02-25 ENCOUNTER — Inpatient Hospital Stay (HOSPITAL_COMMUNITY): Payer: Medicare Other

## 2015-02-25 ENCOUNTER — Encounter (HOSPITAL_COMMUNITY): Payer: Self-pay | Admitting: Gastroenterology

## 2015-02-25 LAB — GLUCOSE, CAPILLARY
GLUCOSE-CAPILLARY: 153 mg/dL — AB (ref 65–99)
GLUCOSE-CAPILLARY: 127 mg/dL — AB (ref 65–99)
GLUCOSE-CAPILLARY: 135 mg/dL — AB (ref 65–99)
Glucose-Capillary: 169 mg/dL — ABNORMAL HIGH (ref 65–99)

## 2015-02-25 LAB — COMPREHENSIVE METABOLIC PANEL
ALK PHOS: 52 U/L (ref 38–126)
ALT: 33 U/L (ref 14–54)
AST: 44 U/L — ABNORMAL HIGH (ref 15–41)
Albumin: 2.9 g/dL — ABNORMAL LOW (ref 3.5–5.0)
Anion gap: 10 (ref 5–15)
BILIRUBIN TOTAL: 0.6 mg/dL (ref 0.3–1.2)
BUN: 27 mg/dL — AB (ref 6–20)
CALCIUM: 8.6 mg/dL — AB (ref 8.9–10.3)
CO2: 22 mmol/L (ref 22–32)
CREATININE: 2.34 mg/dL — AB (ref 0.44–1.00)
Chloride: 100 mmol/L — ABNORMAL LOW (ref 101–111)
GFR, EST AFRICAN AMERICAN: 24 mL/min — AB (ref >60)
GFR, EST NON AFRICAN AMERICAN: 21 mL/min — AB (ref >60)
Glucose, Bld: 190 mg/dL — ABNORMAL HIGH (ref 65–99)
Potassium: 4 mmol/L (ref 3.5–5.1)
SODIUM: 132 mmol/L — AB (ref 135–145)
TOTAL PROTEIN: 6.7 g/dL (ref 6.5–8.1)

## 2015-02-25 LAB — AMYLASE: AMYLASE: 102 U/L — AB (ref 28–100)

## 2015-02-25 LAB — BASIC METABOLIC PANEL
Anion gap: 9 (ref 5–15)
BUN: 28 mg/dL — AB (ref 6–20)
CHLORIDE: 102 mmol/L (ref 101–111)
CO2: 23 mmol/L (ref 22–32)
CREATININE: 2.26 mg/dL — AB (ref 0.44–1.00)
Calcium: 8.8 mg/dL — ABNORMAL LOW (ref 8.9–10.3)
GFR calc Af Amer: 25 mL/min — ABNORMAL LOW (ref >60)
GFR calc non Af Amer: 22 mL/min — ABNORMAL LOW (ref >60)
Glucose, Bld: 195 mg/dL — ABNORMAL HIGH (ref 65–99)
POTASSIUM: 3.9 mmol/L (ref 3.5–5.1)
SODIUM: 134 mmol/L — AB (ref 135–145)

## 2015-02-25 LAB — TROPONIN I
Troponin I: 0.66 ng/mL (ref <0.031)
Troponin I: 0.63 ng/mL (ref <0.031)

## 2015-02-25 MED ORDER — MORPHINE SULFATE (PF) 2 MG/ML IV SOLN
2.0000 mg | INTRAVENOUS | Status: DC | PRN
  Administered 2015-02-26 – 2015-02-28 (×2): 4 mg via INTRAVENOUS
  Administered 2015-02-28: 2 mg via INTRAVENOUS
  Filled 2015-02-25 (×2): qty 2
  Filled 2015-02-25: qty 1

## 2015-02-25 MED ORDER — INFLUENZA VAC SPLIT QUAD 0.5 ML IM SUSY
0.5000 mL | PREFILLED_SYRINGE | INTRAMUSCULAR | Status: AC
  Administered 2015-02-26: 0.5 mL via INTRAMUSCULAR
  Filled 2015-02-25: qty 0.5

## 2015-02-25 NOTE — Progress Notes (Signed)
Requested by Dr. Watt Climes to get patient consent to retrieve endoscopic documents from [facility] advised that patient had no endoscopic records, and may have had endoscopy at [facility] which is next to hospital. Patient provided MD card for [facility]. Faxed consent to [facility]. Bartholomew Crews, RN

## 2015-02-25 NOTE — Progress Notes (Signed)
While at patient's bedside, patient cried out with crushing pain stating it was 14/10. Pain was mid sternal to epigastric area. BP 141/86, HR 85, 95% RA. Pain eased to an 8/10, then was followed by another wave of crushing pain, which eased to 5/10. Dr. Wynonia Lawman notified via answering service. Orders received. Will continue to monitor. Bartholomew Crews, RN

## 2015-02-25 NOTE — Progress Notes (Signed)
Subjective:  Complaining of significant abdominal pain this morning.  Had pain relieved with Maalox last night and again after eating ice cream.  Pain has recurred this morning and is described as severe describes it as epigastric pain but somewhat different than her previous reflux.  No shortness of breath.  No ischemic cardiac pain that I can tell.  Objective:  Vital Signs in the last 24 hours: BP 112/74 mmHg  Pulse 67  Temp(Src) 98.2 F (36.8 C) (Oral)  Resp 16  Ht 5\' 5"  (1.651 m)  Wt 94.484 kg (208 lb 4.8 oz)  BMI 34.66 kg/m2  SpO2 100%  Physical Exam: Obese black female sitting up in bed complaining of abdominal pain Lungs:  Clear Cardiac:  Regular rhythm, normal S1 and S2, no S3, 1/6 systolic murmur left sternal border  Abdomen:  Soft, nontender, no masses Extremities:  No edema present  Intake/Output from previous day: 12/02 0701 - 12/03 0700 In: 1550 [P.O.:1550] Out: 1300 [Urine:1300]  Weight Filed Weights   02/23/15 0500 02/24/15 0500 02/25/15 0528  Weight: 90.629 kg (199 lb 12.8 oz) 90.447 kg (199 lb 6.4 oz) 94.484 kg (208 lb 4.8 oz)    Lab Results: Basic Metabolic Panel:  Recent Labs  02/24/15 1001 02/25/15 0246  NA 138 134*  K 3.8 3.9  CL 104 102  CO2 26 23  GLUCOSE 112* 195*  BUN 24* 28*  CREATININE 2.20* 2.26*   Telemetry: Sinus rhythm  Assessment/Plan:  1.  Abdominal pain which is significant different than her previous symptoms or reflux according to patient 2.  Ischemic cardiomyopathy with chronic systolic heart failure 3.  Chronic kidney disease 4.  Resolving pneumonia 5.  History of ventricular tachycardia with previous ablation  Recommendations:  Her major complaint is significant abdominal pain.  She states these symptoms are different than previous reflux.  Symptoms relieved with Maalox and with some ice cream last night but recurrent today.  I'm going to check an abdominal ultrasound although she is not tender in her abdomen.   Check amylase.  Already on PPI.  Ask for GI consultation.     Kerry Hough  MD [facility]  02/25/2015, 8:13 AM

## 2015-02-25 NOTE — Consult Note (Signed)
Reason for Consult: Abdominal pain Referring Physician: Cardiology  [name] Terry is an 64 y.o. female.  HPI: Patient seen and examined and her hospital computer chart was reviewed and she says her abdominal pain started this hospital stay and she had it a few months ago and an endoscopy was done in [place] and she has a follow-up with her gastroenterologist later this month and she was told she had a hiatal hernia and Protonix helped at that point and she denies any obvious new medicines and eating seems to make her worse and a previous CT in March was okay and she has no new complaints and the pain seems to be worse with eating  Past Medical History  Diagnosis Date  . Ischemic cardiomyopathy     a. s/p MDT dual chamber ICD implanted 2013 by Dr Westley Gambles at [facility], now followed by Dr Rayann Heman  . History of stroke      a. 1993  . Coronary artery disease     a. s/p previous interventions in [place] per patient, no records available b. cath 04/2013 with non-obstructive disease   . Lymphoma ([place])     a. s/p chemo/radiation 2009  . CHF (congestive heart failure) ([place])   . HTN (hypertension)   . HLD (hyperlipidemia)   . Ventricular tachycardia ([place])     a. CL 300 msec requiring ICD shocks therpay 5/14 b. recurrent VT 04/2014, placed on amiodarone c. recurrent VT 05/2014 s/p ablation by Dr Rayann Heman d. s/p repeat ablation 07/2014  . DM2 (diabetes mellitus, type 2), newly diagnosed    . CKD (chronic kidney disease) stage 3, GFR 30-59 ml/min    . Polymyalgia rheumatica ([place])   . Depression   . GERD (gastroesophageal reflux disease)     Past Surgical History  Procedure Laterality Date  . Cardiac defibrillator placement  03/2011    MDT ICD implanted at [facility] by Dr Tana Coast  . Coronary angioplasty with stent placement    . Left and right heart catheterization with coronary angiogram N/A 05/03/2013    non-obstructive CAD  . V-tach ablation N/A 05/26/2014    VT ablation by Dr Rayann Heman - PVCs arising from the  inferolateral LV, extensive substrate ablation  . Electrophysiologic study N/A 08/04/2014    Procedure: V Tach Ablation;  Surgeon: Thompson Grayer, MD;  Location: [facility];  Service: Cardiovascular;  Laterality: N/A;    Family History  Problem Relation Age of Onset  . Diabetes Father   . Hypertension Father   . Heart disease Father   . Alcoholism Father   . Hypertension Mother   . Heart disease Mother   . Breast cancer Mother   . Stroke Mother   . Diabetes Brother   . Diabetes Brother   . Diabetes Brother   . Diabetes Brother   . Hypertension Sister   . Heart disease Sister   . Alcoholism Sister   . Thyroid disease Sister   . Heart attack Sister   . Heart attack Brother   . Stroke Brother     Social History:  reports that she quit smoking about 9 months ago. She has never used smokeless tobacco. She reports that she does not drink alcohol or use illicit drugs.  Allergies:  Allergies  Allergen Reactions  . Flagyl [Metronidazole] Swelling and Rash    Face swells   . Contrast Media [Iodinated Diagnostic Agents] Rash  . Ioxaglate Rash  . Potassium Sulfate Rash and Other (See Comments)    Headaches  .  Potassium-Containing Compounds Other (See Comments)    Headaches-- reports no problems now    Medications: I have reviewed the patient's current medications.  Results for orders placed or performed during the hospital encounter of 02/20/15 (from the past 48 hour(s))  Glucose, capillary     Status: None   Collection Time: 02/23/15  4:43 PM  Result Value Ref Range   Glucose-Capillary 99 65 - 99 mg/dL  Glucose, capillary     Status: None   Collection Time: 02/23/15  9:08 PM  Result Value Ref Range   Glucose-Capillary 90 65 - 99 mg/dL  Basic metabolic panel     Status: Abnormal   Collection Time: 02/24/15  5:32 AM  Result Value Ref Range   Sodium 139 135 - 145 mmol/L   Potassium 5.6 (H) 3.5 - 5.1 mmol/L    Comment: SPECIMEN HEMOLYZED. HEMOLYSIS MAY AFFECT  INTEGRITY OF RESULTS.   Chloride 107 101 - 111 mmol/L   CO2 17 (L) 22 - 32 mmol/L   Glucose, Bld 58 (L) 65 - 99 mg/dL   BUN 25 (H) 6 - 20 mg/dL   Creatinine, Ser 2.15 (H) 0.44 - 1.00 mg/dL   Calcium 9.0 8.9 - 10.3 mg/dL   GFR calc non Af Amer 23 (L) >60 mL/min   GFR calc Af Amer 27 (L) >60 mL/min    Comment: (NOTE) The eGFR has been calculated using the CKD EPI equation. This calculation has not been validated in all clinical situations. eGFR's persistently <60 mL/min signify possible Chronic Kidney Disease.    Anion gap 15 5 - 15  Glucose, capillary     Status: None   Collection Time: 02/24/15  7:36 AM  Result Value Ref Range   Glucose-Capillary 71 65 - 99 mg/dL  Basic metabolic panel     Status: Abnormal   Collection Time: 02/24/15 10:01 AM  Result Value Ref Range   Sodium 138 135 - 145 mmol/L   Potassium 3.8 3.5 - 5.1 mmol/L   Chloride 104 101 - 111 mmol/L   CO2 26 22 - 32 mmol/L   Glucose, Bld 112 (H) 65 - 99 mg/dL   BUN 24 (H) 6 - 20 mg/dL   Creatinine, Ser 2.20 (H) 0.44 - 1.00 mg/dL   Calcium 9.2 8.9 - 10.3 mg/dL   GFR calc non Af Amer 23 (L) >60 mL/min   GFR calc Af Amer 26 (L) >60 mL/min    Comment: (NOTE) The eGFR has been calculated using the CKD EPI equation. This calculation has not been validated in all clinical situations. eGFR's persistently <60 mL/min signify possible Chronic Kidney Disease.    Anion gap 8 5 - 15  Glucose, capillary     Status: Abnormal   Collection Time: 02/24/15 11:03 AM  Result Value Ref Range   Glucose-Capillary 132 (H) 65 - 99 mg/dL  Glucose, capillary     Status: None   Collection Time: 02/24/15  5:00 PM  Result Value Ref Range   Glucose-Capillary 91 65 - 99 mg/dL  Glucose, capillary     Status: Abnormal   Collection Time: 02/24/15  8:57 PM  Result Value Ref Range   Glucose-Capillary 105 (H) 65 - 99 mg/dL  Basic metabolic panel     Status: Abnormal   Collection Time: 02/25/15  2:46 AM  Result Value Ref Range   Sodium 134  (L) 135 - 145 mmol/L   Potassium 3.9 3.5 - 5.1 mmol/L   Chloride 102 101 - 111 mmol/L  CO2 23 22 - 32 mmol/L   Glucose, Bld 195 (H) 65 - 99 mg/dL   BUN 28 (H) 6 - 20 mg/dL   Creatinine, Ser 2.26 (H) 0.44 - 1.00 mg/dL   Calcium 8.8 (L) 8.9 - 10.3 mg/dL   GFR calc non Af Amer 22 (L) >60 mL/min   GFR calc Af Amer 25 (L) >60 mL/min    Comment: (NOTE) The eGFR has been calculated using the CKD EPI equation. This calculation has not been validated in all clinical situations. eGFR's persistently <60 mL/min signify possible Chronic Kidney Disease.    Anion gap 9 5 - 15  Glucose, capillary     Status: Abnormal   Collection Time: 02/25/15  7:35 AM  Result Value Ref Range   Glucose-Capillary 153 (H) 65 - 99 mg/dL  Comprehensive metabolic panel     Status: Abnormal   Collection Time: 02/25/15  9:14 AM  Result Value Ref Range   Sodium 132 (L) 135 - 145 mmol/L   Potassium 4.0 3.5 - 5.1 mmol/L   Chloride 100 (L) 101 - 111 mmol/L   CO2 22 22 - 32 mmol/L   Glucose, Bld 190 (H) 65 - 99 mg/dL   BUN 27 (H) 6 - 20 mg/dL   Creatinine, Ser 2.34 (H) 0.44 - 1.00 mg/dL   Calcium 8.6 (L) 8.9 - 10.3 mg/dL   Total Protein 6.7 6.5 - 8.1 g/dL   Albumin 2.9 (L) 3.5 - 5.0 g/dL   AST 44 (H) 15 - 41 U/L   ALT 33 14 - 54 U/L   Alkaline Phosphatase 52 38 - 126 U/L   Total Bilirubin 0.6 0.3 - 1.2 mg/dL   GFR calc non Af Amer 21 (L) >60 mL/min   GFR calc Af Amer 24 (L) >60 mL/min    Comment: (NOTE) The eGFR has been calculated using the CKD EPI equation. This calculation has not been validated in all clinical situations. eGFR's persistently <60 mL/min signify possible Chronic Kidney Disease.    Anion gap 10 5 - 15  Amylase     Status: Abnormal   Collection Time: 02/25/15  9:14 AM  Result Value Ref Range   Amylase 102 (H) 28 - 100 U/L  Glucose, capillary     Status: Abnormal   Collection Time: 02/25/15 11:21 AM  Result Value Ref Range   Glucose-Capillary 127 (H) 65 - 99 mg/dL    Nm Pulmonary Perf  And Vent  02/23/2015  CLINICAL DATA:  Short of breath following cardiac catheterization. Chest pain. EXAM: NUCLEAR MEDICINE VENTILATION - PERFUSION LUNG SCAN TECHNIQUE: Ventilation images were obtained in multiple projections using inhaled aerosol Tc-15m DTPA. Perfusion images were obtained in multiple projections after intravenous injection of Tc-34m MAA. RADIOPHARMACEUTICALS:  31.3 Technetium-12m DTPA aerosol inhalation and 4.1 Technetium-36m MAA IV COMPARISON:  Radiograph 02/22/2015 FINDINGS: Ventilation: No focal ventilation defect. Perfusion: No wedge shaped peripheral perfusion defects to suggest acute pulmonary embolism. IMPRESSION: No evidence of pulmonary embolism. Electronically Signed   By: Suzy Bouchard M.D.   On: 02/23/2015 14:28    ROS negative except above Blood pressure 139/85, pulse 77, temperature 98.1 F (36.7 C), temperature source Oral, resp. rate 16, height $RemoveBe'5\' 5"'[id number]$  (1.651 m), weight 94.484 kg (208 lb 4.8 oz), SpO2 100 %. Physical Exam vital signs stable afebrile no acute distress exam pertinent for her abdomen being soft nontender good bowel sounds can't reduplicate the patient labs okay very slightly increased lipase probably not significant and due to increased BUN and creatinine  liver tests okay white count normal await ultrasound  Assessment/Plan: Multiple medical problems and abdominal pain Plan: If ultrasound is okay she can go home from my standpoint and follow-up with her primary gastroenterologist in [place] but in the meantime we will request the endoscopy report and I will check on her tomorrow and continue twice a day pump inhibitor  Brinn Westby E 02/25/2015, 1:27 PM

## 2015-02-25 NOTE — Progress Notes (Signed)
CRITICAL VALUE ALERT  Critical value received:  Panic troponin 0.66  Date of notification: 02/25/2015 Time of notification:  [id number]  Critical value read back: yes  Nurse who received alert:  Manya Silvas, RN  MD notified (1st page):  Dr. Percival Spanish [phone]  Time of first page:  1808  Responding MD: Dr. Percival Spanish  Time MD responded:  [phone]

## 2015-02-26 ENCOUNTER — Inpatient Hospital Stay (HOSPITAL_COMMUNITY): Payer: Medicare Other

## 2015-02-26 DIAGNOSIS — I5023 Acute on chronic systolic (congestive) heart failure: Secondary | ICD-10-CM | POA: Diagnosis not present

## 2015-02-26 LAB — GLUCOSE, CAPILLARY
GLUCOSE-CAPILLARY: 78 mg/dL (ref 65–99)
Glucose-Capillary: 135 mg/dL — ABNORMAL HIGH (ref 65–99)
Glucose-Capillary: 136 mg/dL — ABNORMAL HIGH (ref 65–99)

## 2015-02-26 LAB — AMYLASE: Amylase: 95 U/L (ref 28–100)

## 2015-02-26 LAB — ALKALINE PHOSPHATASE: Alkaline Phosphatase: 61 U/L (ref 38–126)

## 2015-02-26 LAB — CBC
HEMATOCRIT: 32.3 % — AB (ref 36.0–46.0)
HEMOGLOBIN: 10.7 g/dL — AB (ref 12.0–15.0)
MCH: 30.8 pg (ref 26.0–34.0)
MCHC: 33.1 g/dL (ref 30.0–36.0)
MCV: 93.1 fL (ref 78.0–100.0)
Platelets: 278 10*3/uL (ref 150–400)
RBC: 3.47 MIL/uL — AB (ref 3.87–5.11)
RDW: 16.1 % — AB (ref 11.5–15.5)
WBC: 14.8 10*3/uL — AB (ref 4.0–10.5)

## 2015-02-26 LAB — BASIC METABOLIC PANEL
ANION GAP: 9 (ref 5–15)
BUN: 21 mg/dL — ABNORMAL HIGH (ref 6–20)
CHLORIDE: 98 mmol/L — AB (ref 101–111)
CO2: 27 mmol/L (ref 22–32)
Calcium: 8.7 mg/dL — ABNORMAL LOW (ref 8.9–10.3)
Creatinine, Ser: 2.19 mg/dL — ABNORMAL HIGH (ref 0.44–1.00)
GFR calc non Af Amer: 23 mL/min — ABNORMAL LOW (ref >60)
GFR, EST AFRICAN AMERICAN: 26 mL/min — AB (ref >60)
Glucose, Bld: 162 mg/dL — ABNORMAL HIGH (ref 65–99)
POTASSIUM: 4.1 mmol/L (ref 3.5–5.1)
SODIUM: 134 mmol/L — AB (ref 135–145)

## 2015-02-26 LAB — LIPASE, BLOOD: LIPASE: 38 U/L (ref 11–51)

## 2015-02-26 LAB — LACTIC ACID, PLASMA: Lactic Acid, Venous: 1.7 mmol/L (ref 0.5–2.0)

## 2015-02-26 LAB — T4, FREE: FREE T4: 0.78 ng/dL (ref 0.61–1.12)

## 2015-02-26 LAB — MAGNESIUM: MAGNESIUM: 2.4 mg/dL (ref 1.7–2.4)

## 2015-02-26 LAB — ALT: ALT: 55 U/L — ABNORMAL HIGH (ref 14–54)

## 2015-02-26 LAB — TSH: TSH: 17.629 u[IU]/mL — AB (ref 0.350–4.500)

## 2015-02-26 LAB — AST: AST: 56 U/L — ABNORMAL HIGH (ref 15–41)

## 2015-02-26 MED ORDER — BARIUM SULFATE 2.1 % PO SUSP
ORAL | Status: AC
  Administered 2015-02-26: 1 mL
  Filled 2015-02-26: qty 2

## 2015-02-26 MED ORDER — AMIODARONE HCL 200 MG PO TABS
200.0000 mg | ORAL_TABLET | Freq: Two times a day (BID) | ORAL | Status: DC
  Administered 2015-02-26 – 2015-02-28 (×4): 200 mg via ORAL
  Filled 2015-02-26 (×4): qty 1

## 2015-02-26 NOTE — Progress Notes (Addendum)
Nurse noted patient to have paroxysmal AF on telemetry this AM - rates slightly higher than sinus rate but not markedly so. Occurred 7:36 AM (~76min), 10:53 AM (~22 min), 11:43 AM (~26 min). Patient continues to note abdominal pain similar to the last few days (see notes). GI made aware and plans for more Maalox and to review endoscopy records from [place] when available, no further recs at present time. Reviewed case with Dr. Rayann Heman. She remains on ASA/Plavix for recently medically treated STEMI. We are not comfortable placing her on additional anticoagulation until the results of further GI eval are known. Will increase amiodarone to 200mg  BID and follow on telemetry. Will check labs to evaluate for any acute changes - BMET, CBC, lactate, Mg, thyroid function. Will need pacer interrogated in AM to assess for prior AF burden.  Caterina Racine PA-C

## 2015-02-26 NOTE — Progress Notes (Signed)
Subjective:  Having recurrent abdominal pain.  She describes it as mid epigastric and cried out yesterday afternoon.  GI consultation noted.  This morning having significant epigastric discomfort and appears uncomfortable.  Ultrasound did not show any gallstones and a normal size duct.  No shortness of breath or chest pain.  Objective:  Vital Signs in the last 24 hours: BP 106/55 mmHg  Pulse 65  Temp(Src) 98.6 F (37 C) (Oral)  Resp 20  Ht 5\' 5"  (1.651 m)  Wt 91.763 kg (202 lb 4.8 oz)  BMI 33.66 kg/m2  SpO2 100%  Physical Exam: Obese black female sitting up in bed complaining of abdominal pain Lungs:  Clear Cardiac:  Regular rhythm, normal S1 and S2, no S3, 1/6 systolic murmur left sternal border  Abdomen:  Very minimal tenderness in the epigastric area  Extremities:  No edema present  Intake/Output from previous day: 12/03 0701 - 12/04 0700 In: 760 [P.O.:760] Out: 2150 [Urine:2150]  Weight Filed Weights   02/24/15 0500 02/25/15 0528 02/26/15 0613  Weight: 90.447 kg (199 lb 6.4 oz) 94.484 kg (208 lb 4.8 oz) 91.763 kg (202 lb 4.8 oz)    Lab Results: Basic Metabolic Panel:  Recent Labs  02/25/15 0246 02/25/15 0914  NA 134* 132*  K 3.9 4.0  CL 102 100*  CO2 23 22  GLUCOSE 195* 190*  BUN 28* 27*  CREATININE 2.26* 2.34*   Telemetry: Sinus rhythm  Assessment/Plan:  1.  Abdominal pain which is significant different than her previous symptoms or reflux according to patient-no evidence of gallstones.  Not relieved with typical treatment for hiatal hernia.  She does have extensive atherosclerosis and I wonder if this could be mesenteric ischemia. 2.  Ischemic cardiomyopathy with chronic systolic heart failure 3.  Chronic kidney disease-slightly worse today. 4.  Resolving pneumonia 5.  History of ventricular tachycardia with previous ablation  Recommendations:  Still quite uncomfortable and not ready for discharge.  EKG was unremarkable yesterday.  I wonder whether  she could have mesenteric ischemia but renal function is such that it will be difficult to get a CTA.  Continue to monitor.     Kerry Hough  MD [facility]  02/26/2015, 7:35 AM

## 2015-02-26 NOTE — Progress Notes (Addendum)
Nurse made GI aware of abd pain who ordered BMET, CBC, lipase, amylase and stat CT abd. The BMET/CBC I ordered earlier have resulted - WBC now 14.8. I asked nursing to make GI aware that the BMET/CBC have already resulted from this afternoon and WBC is now elevated. Otherwise Cr is coming down, Hgb with mild anemia as before, Mg and K are OK. Appreciate GI assistance. Lamark Schue PA-C

## 2015-02-26 NOTE — Progress Notes (Signed)
I spoke with the records department at [facility] and they stated they did not have the pt endoscopy report and that she had the procedure done at the [facility] office.  Fax sent 12/3 to office to obtain records.  Will continue to monitor.

## 2015-02-26 NOTE — Progress Notes (Signed)
Pt is going between afib and SR.  Strips saved in computer and Dr. Wynonia Lawman paged, waiting for return page.  Will continue to closely monitor.

## 2015-02-26 NOTE — Progress Notes (Signed)
Pt complaint of 10/10 severe abdominal pain.  Administered PRN PO maalox and tramadol with no relief.  Administered 4 mg IV morphine which brought pain from 10/10 to a 7/10. Melina Copa, PA notified. Will continue to monitor closely.

## 2015-02-26 NOTE — Progress Notes (Signed)
Pt vomited second bottle of oral contrast for abdomen CT.  CT department notified, will continue to closely monitor.

## 2015-02-26 NOTE — Progress Notes (Signed)
[name] Terry 12:12 PM  Subjective: Patient without any new complaints and her pain is in the midepigastric area worse with eating but no different over the last few months and Maalox helps and unfortunately the fax from [place] with her endoscopy has not come yet and she has no new complaints cardiology note reviewed  Objective: Vital signs stable afebrile no acute distress abdomen is soft nontender really cannot duplicate the pain no CBC today  Assessment: Midepigastric abdominal pain helped by MiraLAX doubt ischemia with normal white count but could discussed with radiology if MRA could be done with her BUN and creatinine  Plan: I think patient is fine to go home and have further GI workup in [place] if needed and can follow-up with her gastroenterologist there otherwise will wait on the above  [facility]  Pager [phone] After 5PM or if no answer call [phone]

## 2015-02-27 DIAGNOSIS — K219 Gastro-esophageal reflux disease without esophagitis: Secondary | ICD-10-CM

## 2015-02-27 DIAGNOSIS — R1013 Epigastric pain: Secondary | ICD-10-CM

## 2015-02-27 DIAGNOSIS — I152 Hypertension secondary to endocrine disorders: Secondary | ICD-10-CM

## 2015-02-27 LAB — BASIC METABOLIC PANEL
Anion gap: 8 (ref 5–15)
BUN: 19 mg/dL (ref 6–20)
CHLORIDE: 100 mmol/L — AB (ref 101–111)
CO2: 28 mmol/L (ref 22–32)
CREATININE: 2.06 mg/dL — AB (ref 0.44–1.00)
Calcium: 8.8 mg/dL — ABNORMAL LOW (ref 8.9–10.3)
GFR calc Af Amer: 28 mL/min — ABNORMAL LOW (ref >60)
GFR, EST NON AFRICAN AMERICAN: 24 mL/min — AB (ref >60)
Glucose, Bld: 78 mg/dL (ref 65–99)
Potassium: 3.8 mmol/L (ref 3.5–5.1)
SODIUM: 136 mmol/L (ref 135–145)

## 2015-02-27 LAB — GLUCOSE, CAPILLARY: Glucose-Capillary: 90 mg/dL (ref 65–99)

## 2015-02-27 LAB — URINALYSIS, ROUTINE W REFLEX MICROSCOPIC
Bilirubin Urine: NEGATIVE
GLUCOSE, UA: NEGATIVE mg/dL
HGB URINE DIPSTICK: NEGATIVE
Ketones, ur: NEGATIVE mg/dL
Leukocytes, UA: NEGATIVE
Nitrite: NEGATIVE
PH: 7 (ref 5.0–8.0)
PROTEIN: NEGATIVE mg/dL
Specific Gravity, Urine: 1.008 (ref 1.005–1.030)

## 2015-02-27 LAB — MAGNESIUM: MAGNESIUM: 2.6 mg/dL — AB (ref 1.7–2.4)

## 2015-02-27 MED ORDER — RANOLAZINE ER 500 MG PO TB12
500.0000 mg | ORAL_TABLET | Freq: Two times a day (BID) | ORAL | Status: DC
  Administered 2015-02-27 – 2015-02-28 (×3): 500 mg via ORAL
  Filled 2015-02-27 (×3): qty 1

## 2015-02-27 NOTE — Progress Notes (Signed)
PATIENT ID: [name] Terry is a 18F with CAD status post STEMI, chronic systolic heart failure EF 30-35% normal VT status post ablation and ICD by Dr. Rayann Heman, hypertension, hyperlipidemia, diabetes, and chronic kidney disease who presented to the chest and shortness of breath.   INTERVAL HISTORY: Yesterday she had recurrent emesis.  CT scan was negative for acute pathology.  GI suggested outpatient follow-up.  SUBJECTIVE: [name] Terry continues to have abdominal discomfort. She has epigastric and substernal burning that is not alleviated with her PPI. It is somewhat helped by her GI cocktail. She has both anorexia and hunger.    PHYSICAL EXAM Filed Vitals:   02/26/15 2109 02/27/15 0454 02/27/15 0605 02/27/15 0805  BP: 124/70 125/75 111/74 121/75  Pulse: 77 72    Temp: 98.6 F (37 C) 98.7 F (37.1 C)    TempSrc: Oral Oral    Resp:      Height:      Weight:  90.765 kg (200 lb 1.6 oz)    SpO2: 93% 100%  98%   General:  Well-appearing.  No acute distress.  Neck: JVD 1 cm above the clavicle at 45 degrees Lungs:  Clear to auscultation bilaterally. No crackles,rhonchi or wheezes.  Heart:  Regular rate and rhythm. 2/6 systolic murmur at the left upper sternal border. Normal S1/S2  Abdomen:  Soft, nontender, nondistended, active bowel sounds  Extremities: Warm and well-perfused. No edema.   LABS: Lab Results  Component Value Date   TROPONINI 0.63* 02/25/2015   Results for orders placed or performed during the hospital encounter of 02/20/15 (from the past 24 hour(s))  Glucose, capillary     Status: Abnormal   Collection Time: 02/26/15 11:20 AM  Result Value Ref Range   Glucose-Capillary 135 (H) 65 - 99 mg/dL   Comment 1 Notify RN    Comment 2 Document in Chart   Basic metabolic panel     Status: Abnormal   Collection Time: 02/26/15  2:02 PM  Result Value Ref Range   Sodium 134 (L) 135 - 145 mmol/L   Potassium 4.1 3.5 - 5.1 mmol/L   Chloride 98 (L) 101 - 111 mmol/L   CO2 27 22 - 32  mmol/L   Glucose, Bld 162 (H) 65 - 99 mg/dL   BUN 21 (H) 6 - 20 mg/dL   Creatinine, Ser 2.19 (H) 0.44 - 1.00 mg/dL   Calcium 8.7 (L) 8.9 - 10.3 mg/dL   GFR calc non Af Amer 23 (L) >60 mL/min   GFR calc Af Amer 26 (L) >60 mL/min   Anion gap 9 5 - 15  Magnesium     Status: None   Collection Time: 02/26/15  2:02 PM  Result Value Ref Range   Magnesium 2.4 1.7 - 2.4 mg/dL  CBC     Status: Abnormal   Collection Time: 02/26/15  2:02 PM  Result Value Ref Range   WBC 14.8 (H) 4.0 - 10.5 K/uL   RBC 3.47 (L) 3.87 - 5.11 MIL/uL   Hemoglobin 10.7 (L) 12.0 - 15.0 g/dL   HCT 32.3 (L) 36.0 - 46.0 %   MCV 93.1 78.0 - 100.0 fL   MCH 30.8 26.0 - 34.0 pg   MCHC 33.1 30.0 - 36.0 g/dL   RDW 16.1 (H) 11.5 - 15.5 %   Platelets 278 150 - 400 K/uL  Lactic acid, plasma     Status: None   Collection Time: 02/26/15  2:02 PM  Result Value Ref Range   Lactic Acid,  Venous 1.7 0.5 - 2.0 mmol/L  TSH     Status: Abnormal   Collection Time: 02/26/15  2:02 PM  Result Value Ref Range   TSH 17.629 (H) 0.350 - 4.500 uIU/mL  T4, free     Status: None   Collection Time: 02/26/15  2:02 PM  Result Value Ref Range   Free T4 0.78 0.61 - 1.12 ng/dL  Lipase, blood     Status: None   Collection Time: 02/26/15  3:20 PM  Result Value Ref Range   Lipase 38 11 - 51 U/L  Amylase     Status: None   Collection Time: 02/26/15  3:20 PM  Result Value Ref Range   Amylase 95 28 - 100 U/L  AST     Status: Abnormal   Collection Time: 02/26/15  3:20 PM  Result Value Ref Range   AST 56 (H) 15 - 41 U/L  ALT     Status: Abnormal   Collection Time: 02/26/15  3:20 PM  Result Value Ref Range   ALT 55 (H) 14 - 54 U/L  Alkaline phosphatase     Status: None   Collection Time: 02/26/15  3:20 PM  Result Value Ref Range   Alkaline Phosphatase 61 38 - 126 U/L  Glucose, capillary     Status: Abnormal   Collection Time: 02/26/15  4:46 PM  Result Value Ref Range   Glucose-Capillary 136 (H) 65 - 99 mg/dL  Basic metabolic panel      Status: Abnormal   Collection Time: 02/27/15  4:44 AM  Result Value Ref Range   Sodium 136 135 - 145 mmol/L   Potassium 3.8 3.5 - 5.1 mmol/L   Chloride 100 (L) 101 - 111 mmol/L   CO2 28 22 - 32 mmol/L   Glucose, Bld 78 65 - 99 mg/dL   BUN 19 6 - 20 mg/dL   Creatinine, Ser 2.06 (H) 0.44 - 1.00 mg/dL   Calcium 8.8 (L) 8.9 - 10.3 mg/dL   GFR calc non Af Amer 24 (L) >60 mL/min   GFR calc Af Amer 28 (L) >60 mL/min   Anion gap 8 5 - 15  Magnesium     Status: Abnormal   Collection Time: 02/27/15  4:44 AM  Result Value Ref Range   Magnesium 2.6 (H) 1.7 - 2.4 mg/dL    Intake/Output Summary (Last 24 hours) at 02/27/15 0934 Last data filed at 02/27/15 0617  Gross per 24 hour  Intake    970 ml  Output   1425 ml  Net   -455 ml    EKG:  Sinus rhythm rate 75 bpm. Inferior infarct, age indeterminate.  Unchanged from prior.  ASSESSMENT AND PLAN:  Active Problems:   HTN (hypertension)   HLD (hyperlipidemia)   Cardiomyopathy, ischemic-EF 25-30%   CAD S/P prior RCA PCI, cath 05/03/2013- med Rx   Elevated troponin   Acute on chronic systolic (congestive) heart failure ([place])   # CAD/STEMI: [name] Terry's recent MI was managed medically as it reportedly was not amenable to PCI. I wonder if her GI symptoms could be an anginal equivalent. We will start Ranexa to see if this alleviates her symptoms, as her GI workup thus far has been unremarkable.  - Ranexa 500 mg bid - Continue aspirin and Plavix - Continue carvedilol, and Imdur  # Hypertension: Blood pressure is well-controlled on carvedilol and hydralazine.  # Chronic systolic heart failure: She appears to be near euvolemic today. - Continue carvedilol, Lasix, hydralazine and  Imdur - She is not on spironolactone or A due to chronic kidney disease.CE-I/ARB   # Afib/VT: She is currently in sinus rhythm with occasional PVCs. Continue amiodarone.  Ranexa was added this admission and should help with arrhythmia as well.  She is not on  anticoagulation despite her CHA2DS2-Vasc sore of 4 due to dual antiplatelet therapy and bleeding risk. This patients CHA2DS2-VASc Score and unadjusted Ischemic Stroke Rate (% per year) is equal to 4.8 % stroke rate/year from a score of 4  Above score calculated as 1 point each if present [CHF, HTN, DM, Vascular=MI/PAD/Aortic Plaque, Age if 65-74, or Female] Above score calculated as 2 points each if present [Age > 75, or Stroke/TIA/TE]  # Abdominal pain: GI workup thus far has been unrevealing. Continue GI cocktail and twice a day PPI. 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  We will also check a urinalysis.  [name] Terry C. Oval [name], MD, [facility]  02/27/2015 9:34 AM

## 2015-02-27 NOTE — Progress Notes (Signed)
CARDIAC REHAB PHASE I   PRE:  Rate/Rhythm: 78 SR  BP:  Sitting: 113/62        SaO2: 100 RA  MODE:  Ambulation: 550 ft   POST:  Rate/Rhythm: 84 SR  BP:  Sitting: 111/58         SaO2: 100 RA  Pt sitting up eating breakfast, ready to walk, states she feels better eating this morning. Pt ambulated 550 ft on RA, mostly steady gait, hand held assist, tolerated well. Pt denies CP, dizziness, DOE, declined rest stop, did c/o R leg heaviness, states she thinks her leg is stiff from being in bed. Pt states she has no questions regarding education at this time.  Pt to recliner after walk, call bell within reach. Will follow.   [id number]  Lenna Sciara, RN, BSN 02/27/2015 9:18 AM

## 2015-02-27 NOTE — Progress Notes (Signed)
[name] Terry 9:50 AM  Subjective: Patient doing better from a GI standpoint and her case discussed with cardiology and her endoscopy still has not arrived but she has no new complaints and feels better overall eating okay  Objective: Vital signs stable afebrile no acute distress abdomen is soft nontender labs okay except slight increase in white count CT scan okay overall  Assessment: Improved GI symptoms  Plan: Please call me if I can be of any further assistance and may want a repeat white count just to be sure and consider stopping clindamycin since she's been on it a while and can follow-up with her primary gastroenterologist in [place]  Pager [phone] After 5PM or if no answer call [phone]

## 2015-02-28 ENCOUNTER — Telehealth: Payer: Self-pay | Admitting: Internal Medicine

## 2015-02-28 ENCOUNTER — Other Ambulatory Visit: Payer: Self-pay | Admitting: Physician Assistant

## 2015-02-28 ENCOUNTER — Encounter (HOSPITAL_COMMUNITY): Payer: Self-pay | Admitting: Physician Assistant

## 2015-02-28 DIAGNOSIS — R945 Abnormal results of liver function studies: Secondary | ICD-10-CM

## 2015-02-28 DIAGNOSIS — I10 Essential (primary) hypertension: Secondary | ICD-10-CM

## 2015-02-28 DIAGNOSIS — I340 Nonrheumatic mitral (valve) insufficiency: Secondary | ICD-10-CM

## 2015-02-28 DIAGNOSIS — I480 Paroxysmal atrial fibrillation: Secondary | ICD-10-CM

## 2015-02-28 DIAGNOSIS — D72829 Elevated white blood cell count, unspecified: Secondary | ICD-10-CM

## 2015-02-28 DIAGNOSIS — R748 Abnormal levels of other serum enzymes: Secondary | ICD-10-CM

## 2015-02-28 DIAGNOSIS — R109 Unspecified abdominal pain: Secondary | ICD-10-CM

## 2015-02-28 DIAGNOSIS — N183 Chronic kidney disease, stage 3 unspecified: Secondary | ICD-10-CM

## 2015-02-28 DIAGNOSIS — R7989 Other specified abnormal findings of blood chemistry: Secondary | ICD-10-CM

## 2015-02-28 DIAGNOSIS — K5790 Diverticulosis of intestine, part unspecified, without perforation or abscess without bleeding: Secondary | ICD-10-CM

## 2015-02-28 DIAGNOSIS — K449 Diaphragmatic hernia without obstruction or gangrene: Secondary | ICD-10-CM

## 2015-02-28 DIAGNOSIS — R1012 Left upper quadrant pain: Secondary | ICD-10-CM

## 2015-02-28 DIAGNOSIS — K5730 Diverticulosis of large intestine without perforation or abscess without bleeding: Secondary | ICD-10-CM

## 2015-02-28 DIAGNOSIS — K2970 Gastritis, unspecified, without bleeding: Secondary | ICD-10-CM

## 2015-02-28 LAB — BASIC METABOLIC PANEL
ANION GAP: 6 (ref 5–15)
BUN: 21 mg/dL — ABNORMAL HIGH (ref 6–20)
CALCIUM: 8.7 mg/dL — AB (ref 8.9–10.3)
CO2: 27 mmol/L (ref 22–32)
CREATININE: 2.31 mg/dL — AB (ref 0.44–1.00)
Chloride: 99 mmol/L — ABNORMAL LOW (ref 101–111)
GFR, EST AFRICAN AMERICAN: 25 mL/min — AB (ref >60)
GFR, EST NON AFRICAN AMERICAN: 21 mL/min — AB (ref >60)
Glucose, Bld: 110 mg/dL — ABNORMAL HIGH (ref 65–99)
Potassium: 4.2 mmol/L (ref 3.5–5.1)
SODIUM: 132 mmol/L — AB (ref 135–145)

## 2015-02-28 LAB — GLUCOSE, CAPILLARY
GLUCOSE-CAPILLARY: 113 mg/dL — AB (ref 65–99)
GLUCOSE-CAPILLARY: 141 mg/dL — AB (ref 65–99)

## 2015-02-28 MED ORDER — RANOLAZINE ER 500 MG PO TB12: 500.0000 mg | ORAL_TABLET | Freq: Two times a day (BID) | ORAL | Status: DC

## 2015-02-28 MED ORDER — SUCRALFATE 1 GM/10ML PO SUSP: 1.0000 g | Freq: Three times a day (TID) | ORAL | Status: DC

## 2015-02-28 MED ORDER — ISOSORBIDE MONONITRATE ER 30 MG PO TB24: 30.0000 mg | ORAL_TABLET | Freq: Every day | ORAL | Status: DC

## 2015-02-28 MED ORDER — PANTOPRAZOLE SODIUM 40 MG PO TBEC: 40.0000 mg | DELAYED_RELEASE_TABLET | Freq: Two times a day (BID) | ORAL | Status: AC

## 2015-02-28 MED ORDER — HYDRALAZINE HCL 10 MG PO TABS: 10.0000 mg | ORAL_TABLET | Freq: Three times a day (TID) | ORAL | Status: DC

## 2015-02-28 MED ORDER — TRAMADOL HCL 50 MG PO TABS: 50.0000 mg | ORAL_TABLET | Freq: Two times a day (BID) | ORAL | Status: DC | PRN

## 2015-02-28 MED ORDER — AMIODARONE HCL 200 MG PO TABS: 200.0000 mg | ORAL_TABLET | Freq: Two times a day (BID) | ORAL | Status: AC

## 2015-02-28 NOTE — Discharge Summary (Signed)
Discharge Summary   Patient ID: [name] Terry,  MRN: [id number], DOB/AGE: 1950-11-26 64 y.o.  Admit date: 02/20/2015 Discharge date: 02/28/2015  Primary Care Provider: Karna Dupes Terry Primary Cardiologist: Dr. Johnsie Terry Primary EP: [name] Terry  Discharge Diagnoses    Principal Problem:   Acute on chronic systolic (congestive) heart failure (HCC) Active Problems:   HTN (hypertension)   HLD (hyperlipidemia)   Cardiomyopathy, ischemic-EF 35-40%   CAD S/P prior RCA PCI, cath 05/03/2013- med Rx, STEMI 01/2015 - 100% prox PDA, treated medically   Elevated troponin   PAF (paroxysmal atrial fibrillation) (HCC)   Abdominal pain - possible recurrent gastritis   Diverticulosis   Mitral regurgitation   Abnormal serum level of amylase   Abnormal LFTs   Leukocytosis   Allergies Allergies  Allergen Reactions  . Flagyl [Metronidazole] Swelling and Rash    Face swells   . Contrast Media [Iodinated Diagnostic Agents] Rash  . Ioxaglate Rash  . Potassium Sulfate Rash and Other (See Comments)    Headaches  . Potassium-Containing Compounds Other (See Comments)    Headaches-- reports no problems now    Diagnostic Studies/Procedures    2D Echo 02/22/15 - - Left ventricle: Wall thickness was increased in a pattern ofmoderate LVH. Systolic function was moderately reduced. Theestimated ejection fraction was in the range of 35% to 40%. - Mitral valve: There was moderate regurgitation. - Left atrium: The atrium was severely dilated. - Pulmonary arteries: Systolic pressure was mildly increased. PApeak pressure: 31 mm Hg (S). _____________   History of Present Illness  Ms. [name] Terry is a 64 y/o F with history of CAD, HTN, ICM s/p MDT dual chamber ICD 2013, lymphoma s/p chemo/radiation [id number], chronic systolic CHF, HTN, HLD, VT, DM, CKD stage III, PMR and depression who presented to [name] initially with CP and SOB, and had prolonged admission due to abdominal pain. The patient  was recently admitted to [name] for inferior wall STEMI - cardiac cath had shown 100% stenosis of prox PDA, patent stent in RCA with otherwise normal coronary arteries. Echocardiogram showed EF 30-35%. Post cath patient developed pulmonary edema, hemoptysis and pneumonia. She was treated with IV Lasix and discharged home 02/19/15 with prescription for clindamycin.   Terry Course   On 02/20/15 she presented to [name] with progressive SOB and chest discomfort. She was noted to have elevated troponin but this was downtrending, likely related to recent STEMI. EKG did not show any residual ST elevation. CXR showed mild interstitial edema and BNP was elevated. She was admitted for acute on chronic systolic CHF with plans for IV diuresis. 2D Echo 02/22/15: EF 35-40%, mod MR, severely dilated LA, mildly increased PASP 49mmHg. On 02/22/15 she was still noting SOB despite clear lungs and no edema after diuresis. It was questioned whether she had an anxiety component or some GERD. She also began to note chest pain while lying supine. VQ scan was negative for PE and she was changed to oral Lasix. Ranexa was added. Her cath films were reviewed by interventionalists who agreed that her recently diagnosed coronary lesion could not be reopened.   On 02/25/15 she began to complain of abdominal pain, relieved with Maalox. This tended to occur after meals and was associated with dysphagia. Abd US showed no explanation for acute abdominal pain, did show left renal atrophy, likely from chronic renal arterial stenosis given appearance and left renal artery stenting. Amylase was elevated at 102 and AST was mildly up at 44.  GI consultation was obtained. She was seen by [name] Terry who recommended to continue BID PPI and f/u as outpatient. Unfortunately the patient continued to have significant abdominal pain preventing discharge. [name] Terry wondered if there could be a component of mesenteric ischemia  but her renal function was such that it would be difficult to get a CTA.   On 02/26/15 the patient was noted to have very brief paroxysms of atrial fib.  Lactic acid, potassium and Mg were all normal. She did have elevated WBC 14.9 with mildly elevated AST/ALT 56/55. GI performed a stat CT abdomen showing no acute abnormality to explain her symptoms; scattered bilateral renal cysts seen; relatively diffuse calcification along the abdominal aorta and its branches, diffuse coronary artery calcifications seen; scattered diverticulosis along the descending and proximal sigmoid colon, without evidence of diverticulitis. UA was negative.Her atrial fib was discussed with [name] Terry - amiodarone was increased to 200mg  BID. She was not placed on anticoagulation given dual antiplatelet therapy and bleeding risk including in the setting of possible GI pathology. She remained in NSR there remainder of her admission.  It was noted during cardiac rehab and daily rounding the patient appeared significantly anxious. Without further GI intervention her symptoms improved. She has ambulated without further CP, SOB. She is tolerating oral foods but still with some abdominal discomfort. [name] discussed the patient with [name] Terry who suggested that her symptoms did not warrant further inpatient evaluation. Her upper endoscopy was reviewed from 06/2014, and it revealed a hiatal hernia with gastritis. She will be discharged home with BID Protonix and addition of Carafate with recommendation to f/u with gastroenterologist in [place]. [name] Terry has seen and examined the patient today and feels she is stable for discharge. She has authorized the patient to have a very short term rx of tramadol but ultimately told the patient she would benefit from weaning off as these meds can slow GI transit time. Note she is not on ACEI/ARB/spiro due to CKD.  With regard to f/u, she would benefit from a repeat CBC and CMET in several days  to trend creatinine (had contrast with CT scan), LFTs (given minimal bump in LFTs and amiodarone increase), and white count (given elevation several days ago). Note recent Cr trend in 2016 has run 1.9-2.2. Cr today is 2.3. --> FYI: She was on Lipitor 80mg  here in the Terry but given mild increase in LFTs, will keep at 40mg  daily until we trend. This can be further increased at f/u appt if needed. She also had an abnormal TSH level at 17 but free T4 was normal - consider repeating at f/u appt if she is feeling better (?sick euthyroid). I could not reach the office this afternoon by phone- I have sent a message to our [facility] office's scheduler requesting f/u CMET/CBC in 2 days as well as 1 week TOC follow-up appointment. Our office will call the patient with this information. Her pacemaker will need to be followed in the future to reassess for recurrent atrial fibrillation.   There were several med changes made this admission. See list below. To try and help her understand these changes I outlined this info on her AVS: "There have been several changes to your medicines this admission. Please note the following: The following medicines have been STOPPED: potassium chloride, clindamycin, amlodipine The following medicines are NEW: isosorbide mononitrate, hydralazine, Ranexa, sucralfate, tramadol The following medicines have CHANGED DOSES: amiodarone, Protonix"   Consultants: GI - [name] Terry _____________  Discharge  Vitals Blood pressure 120/73, pulse 71, temperature 98.7 F (37.1 C), temperature source Oral, resp. rate 18, height 5\' 5"  (1.651 m), weight 201 lb 14.4 oz (91.581 kg), SpO2 100 %.  Filed Weights   02/26/15 0613 02/27/15 0454 02/28/15 0521  Weight: 202 lb 4.8 oz (91.763 kg) 200 lb 1.6 oz (90.765 kg) 201 lb 14.4 oz (91.581 kg)   _____________  Labs     CBC  Recent Labs  02/26/15 1402  WBC 14.8*  HGB 10.7*  HCT 32.3*  MCV 93.1  PLT 0000000   Basic Metabolic Panel  Recent  Labs  02/26/15 1402 02/27/15 0444 02/28/15 0436  NA 134* 136 132*  K 4.1 3.8 4.2  CL 98* 100* 99*  CO2 27 28 27   GLUCOSE 162* 78 110*  BUN 21* 19 21*  CREATININE 2.19* 2.06* 2.31*  CALCIUM 8.7* 8.8* 8.7*  MG 2.4 2.6*  --    Liver Function Tests  Recent Labs  02/26/15 1520  AST 56*  ALT 55*  ALKPHOS 61    Recent Labs  02/26/15 1520  LIPASE 38  AMYLASE 95   Cardiac Enzymes  Recent Labs  02/25/15 1636 02/25/15 2127  TROPONINI 0.66* 0.63*   Thyroid Function Tests  Recent Labs  02/26/15 1402  TSH 17.629*   _____________  Disposition   Pt is being discharged home today in good condition. _____________  Follow-up Plans & Appointments    Follow-up Information    Follow up with Jenkins Rouge, MD.   Specialty:  Cardiology   Why:  Office will call you for your followup appointment appointment. We will also want to recheck your labwork in about 2 days. The office will call you to give further instructions on this as well. Call office if you have not heard back in 3 days.   Contact information:   [id number] [address] [phone]       Follow up with Gastroenterologist.   Why:  Please call your gastroenterologist as soon as possible for a follow-up appointment.     Discharge Instructions    Amb Referral to Cardiac Rehabilitation    Complete by:  As directed   Diagnosis:  Myocardial Infarction     Diet - low sodium heart healthy    Complete by:  As directed      Increase activity slowly    Complete by:  As directed   No driving until cleared by your cardiologist. No lifting over 10 lbs for 2 weeks. No sexual activity for 2 weeks.  There have been several changes to your medicines this admission. Please note the following: The following medicines have been STOPPED: potassium chloride, clindamycin, amlodipine The following medicines are NEW: isosorbide mononitrate, hydralazine, Ranexa, sucralfate, tramadol The following  medicines have CHANGED DOSES: amiodarone, Protonix           Discharge Medications   Current Discharge Medication List    START taking these medications   Details  hydrALAZINE (APRESOLINE) 10 MG tablet Take 1 tablet (10 mg total) by mouth every 8 (eight) hours. Qty: 90 tablet, Refills: 3    isosorbide mononitrate (IMDUR) 30 MG 24 hr tablet Take 1 tablet (30 mg total) by mouth daily. Qty: 30 tablet, Refills: 3    ranolazine (RANEXA) 500 MG 12 hr tablet Take 1 tablet (500 mg total) by mouth 2 (two) times daily. Qty: 60 tablet, Refills: 3    sucralfate (CARAFATE) 1 GM/10ML suspension Take 10 mLs (1 g total)  by mouth 4 (four) times daily -  with meals and at bedtime. Qty: 420 mL, Refills: 0    traMADol (ULTRAM) 50 MG tablet Take 1 tablet (50 mg total) by mouth every 12 (twelve) hours as needed for moderate pain or severe pain. Qty: 6 tablet, Refills: 0      CONTINUE these medications which have CHANGED   Details  amiodarone (PACERONE) 200 MG tablet Take 1 tablet (200 mg total) by mouth 2 (two) times daily. Qty: 60 tablet, Refills: 1    pantoprazole (PROTONIX) 40 MG tablet Take 1 tablet (40 mg total) by mouth 2 (two) times daily. Qty: 60 tablet, Refills: 1      CONTINUE these medications which have NOT CHANGED   Details  aspirin EC 81 MG tablet Take 81 mg by mouth daily.    atorvastatin (LIPITOR) 40 MG tablet Take 1 tablet (40 mg total) by mouth daily at 6 PM.    Associated Diagnoses: Cardiomyopathy, ischemic    carvedilol (COREG) 6.25 MG tablet Take 6.25 mg by mouth 2 (two) times daily with a meal.    clopidogrel (PLAVIX) 75 MG tablet Take 75 mg by mouth at bedtime.     furosemide (LASIX) 40 MG tablet Take 1 tablet (40 mg total) by mouth daily.     glipiZIDE (GLUCOTROL XL) 2.5 MG 24 hr tablet Take 2.5 mg by mouth daily with breakfast.    Multiple Vitamin (MULTI VITAMIN DAILY PO) Take 1 tablet by mouth daily.    nitroGLYCERIN (NITROSTAT) 0.4 MG SL tablet Place 1  tablet (0.4 mg total) under the tongue every 5 (five) minutes as needed for chest pain.       STOP taking these medications     amLODipine (NORVASC) 10 MG tablet      clindamycin (CLEOCIN) 300 MG capsule      potassium chloride (K-DUR,KLOR-CON) 10 MEQ tablet          This admission was NOT for acute MI but the following information was addressed:   Aspirin prescribed at discharge:  Yes High Intensity Statin Prescribed? (Lipitor 40-80mg  or Crestor 20-40mg ): Yes Beta Blocker Prescribed: Yes ADP Receptor Inhibitor Prescribed? (i.e. Plavix etc.-Includes Medically Managed Patients): Yes For EF <40%, Aldosterone Inhibitor Prescribed? No: CKD Was EF assessed during THIS hospitalization? Yes Was Cardiac Rehab II ordered? (Included Medically managed Patients): Yes _____________   Outstanding Labs/Studies   See above. CBC/CMET in 2 days.  Duration of Discharge Encounter   Greater than 30 minutes including physician time.  Signed, Melina Copa PA-C 02/28/2015, 2:28 PM   Patient seen and examined.  Plan as discussed in my rounding note for today and outlined above.   Tony Granquist C. Oval [name], MD, [facility]  02/28/2015   4:24 PM

## 2015-02-28 NOTE — Progress Notes (Signed)
[id number] Offered to walk with pt. Declined as she stated she did not feel well and still hurting. Gave pt blankets and increased room temperature as she stated she was cold. Appears anxious. Emotional support given. Will continue to follow.Graylon Good RN BSN 02/28/2015 11:57 AM

## 2015-02-28 NOTE — Progress Notes (Signed)
PATIENT ID: [name] Terry is a 68F with CAD status post STEMI, chronic systolic heart failure EF 30-35% normal VT status post ablation and ICD by Dr. Rayann Heman, hypertension, hyperlipidemia, diabetes, and chronic kidney disease who presented to the chest and shortness of breath.   INTERVAL HISTORY: No events overnight. Ms Hutmacher was started on Ranexa yesterday.  SUBJECTIVE: [name] Terry continues to complain of abdominal pain. She has dysphasia and pain under her left breast when eating.   PHYSICAL EXAM Filed Vitals:   02/27/15 1400 02/27/15 1734 02/27/15 2006 02/28/15 0521  BP: 119/82 121/54 132/79 120/73  Pulse: 80  80 71  Temp: 98.2 F (36.8 C)  98.9 F (37.2 C) 98.7 F (37.1 C)  TempSrc: Oral  Oral Oral  Resp: 18  18 18   Height:      Weight:    91.581 kg (201 lb 14.4 oz)  SpO2: 100%  99% 100%   General:  Well-appearing.  No acute distress.  Neck: No JVD Lungs:  Clear to auscultation bilaterally. No crackles,rhonchi or wheezes.  Heart:  Regular rate and rhythm. 3/6 holosystolic murmur at the left lower sternal border and apex. Normal S1/S2  Abdomen:  Soft, nontender, nondistended, active bowel sounds  Extremities: Warm and well-perfused. No edema.   LABS: Lab Results  Component Value Date   TROPONINI 0.63* 02/25/2015   Results for orders placed or performed during the hospital encounter of 02/20/15 (from the past 24 hour(s))  Urinalysis, Routine w reflex microscopic (not at [facility])     Status: None   Collection Time: 02/27/15  2:00 PM  Result Value Ref Range   Color, Urine YELLOW YELLOW   APPearance CLEAR CLEAR   Specific Gravity, Urine 1.008 1.005 - 1.030   pH 7.0 5.0 - 8.0   Glucose, UA NEGATIVE NEGATIVE mg/dL   Hgb urine dipstick NEGATIVE NEGATIVE   Bilirubin Urine NEGATIVE NEGATIVE   Ketones, ur NEGATIVE NEGATIVE mg/dL   Protein, ur NEGATIVE NEGATIVE mg/dL   Nitrite NEGATIVE NEGATIVE   Leukocytes, UA NEGATIVE NEGATIVE  Glucose, capillary     Status: None   Collection Time: 02/27/15  8:34 PM  Result Value Ref Range   Glucose-Capillary 90 65 - 99 mg/dL  Basic metabolic panel     Status: Abnormal   Collection Time: 02/28/15  4:36 AM  Result Value Ref Range   Sodium 132 (L) 135 - 145 mmol/L   Potassium 4.2 3.5 - 5.1 mmol/L   Chloride 99 (L) 101 - 111 mmol/L   CO2 27 22 - 32 mmol/L   Glucose, Bld 110 (H) 65 - 99 mg/dL   BUN 21 (H) 6 - 20 mg/dL   Creatinine, Ser 2.31 (H) 0.44 - 1.00 mg/dL   Calcium 8.7 (L) 8.9 - 10.3 mg/dL   GFR calc non Af Amer 21 (L) >60 mL/min   GFR calc Af Amer 25 (L) >60 mL/min   Anion gap 6 5 - 15  Glucose, capillary     Status: Abnormal   Collection Time: 02/28/15  7:33 AM  Result Value Ref Range   Glucose-Capillary 113 (H) 65 - 99 mg/dL    Intake/Output Summary (Last 24 hours) at 02/28/15 [id number] Last data filed at 02/28/15 0800  Gross per 24 hour  Intake   1560 ml  Output   1950 ml  Net   -390 ml    EKG:  Sinus rhythm rate 75 bpm. Inferior infarct, age indeterminate.  Unchanged from prior.  ASSESSMENT AND PLAN:  Active Problems:  HTN (hypertension)   HLD (hyperlipidemia)   Cardiomyopathy, ischemic-EF 25-30%   CAD S/P prior RCA PCI, cath 05/03/2013- med Rx   Elevated troponin   Acute on chronic systolic (congestive) heart failure ([place])   # CAD/STEMI: Ms. Smoak's recent MI was managed medically as it was not amenable to PCI. I reviewed this with our interventionalists who agreed that it could not be reopened. She was started on Ranexa yesterday for management of her chronic angina.  - Ranexa 500 mg bid - Continue aspirin and Plavix - Continue carvedilol, and Imdur  # Abdominal pain: GI workup thus far has been unrevealing. She was seen by gastroenterology yesterday who did not have any further inpatient recommendations. They had not yet received her outpatient endoscopy report. We will follow up with them, as all her symptoms occur after eating. Urinalysis was negative for infection. We will stop  clindamycin, as it does not seem to be improving her symptoms and there is no evidence of diverticulitis or any other acute pathology on CT scan.  # Hypertension: Blood pressure is well-controlled on carvedilol and hydralazine.  # Chronic systolic heart failure: She appears to be near euvolemic today. - Continue carvedilol, Lasix, hydralazine and Imdur - She is not on spironolactone or A due to chronic kidney disease.CE-I/ARB   # Afib/VT: She is currently in sinus rhythm with occasional PVCs. Continue amiodarone.  Ranexa was added this admission and should help with arrhythmia as well.  She is not on anticoagulation despite her CHA2DS2-Vasc sore of 4 due to dual antiplatelet therapy and bleeding risk. This patients CHA2DS2-VASc Score and unadjusted Ischemic Stroke Rate (% per year) is equal to 4.8 % stroke rate/year from a score of 4  Above score calculated as 1 point each if present [CHF, HTN, DM, Vascular=MI/PAD/Aortic Plaque, Age if 65-74, or Female] Above score calculated as 2 points each if present [Age > 75, or Stroke/TIA/TE]   Fern Canova C. Oval [name], MD, [facility]  02/28/2015 9:37 AM

## 2015-02-28 NOTE — Progress Notes (Signed)
Patient complains of shortness of breath with this last pain episode.  No respiratory distress noted per RN.  2L nasal cannula applied for patient comfort.  Patient 02 saturation 98% on 2L.

## 2015-02-28 NOTE — Plan of Care (Signed)
Problem: Phase II Progression Outcomes Goal: Anginal pain relieved Outcome: Completed/Met Date Met:  02/28/15 With PRN medication interventions.       

## 2015-02-28 NOTE — Telephone Encounter (Signed)
TCM per [facility]   12/13 @ 10 am w/ Cecille Rubin

## 2015-02-28 NOTE — Telephone Encounter (Signed)
New Message    Pt's pharmacy calling stating that Melina Copa PA put in a prescription for Carafate liquid for the pt and it is not covered by her insurance and wants to know if they want to prescribe something else. Please call back and advise.

## 2015-02-28 NOTE — Plan of Care (Signed)
I discussed [name] with gastroenterology consultant Dr. Watt Climes, who suggested that her symptoms do not warrant further inpatient evaluation.  Her upper endoscopy was reviewed from 06/2014, and it revealed a hiatal hernia with gastritis.  She is now on protonix 40 mg bid and maalox/mylanta q6h prn.  We will add carafate with meals and at bedtime.  She will need to follow up with her gastroenterologist in [place].  [name] C. Oval [name], MD, [facility]  02/28/2015 1:27 PM

## 2015-02-28 NOTE — Telephone Encounter (Signed)
[name] Terry d/c from hospital today.  [name] Dunn,PA wrote Rx for liquid carafate.  [name] Terry from [facility] calling stating her insurance does not cover the liquid.  LM on [name] Terry's cell phone that she would need to call her GI physician-[name] Terry- for prescribing another medication in place of carafate.  Advised pharmacy also.  [name] Terry at pharmacy list of medications; both d/c meds and added medications.

## 2015-02-28 NOTE — Plan of Care (Signed)
Problem: Phase III Progression Outcomes Goal: No anginal pain Outcome: Progressing Patient continues to complain of intermittent, mid/upper abdomen dull pain that radiates around left breast.  Patient given PRN Maalox and PRN Tramadol which appear to be effective in relieving pain.  Patient is laying in bed at this time with eyes closed, appears to be sleeping.

## 2015-02-28 NOTE — Care Management Important Message (Signed)
Important Message  Patient Details  Name: [name] Terry MRN: [id number] Date of Birth: [birth year]   Medicare Important Message Given:  Yes    [name] 02/28/2015, 11:20 AM

## 2015-03-01 ENCOUNTER — Encounter (HOSPITAL_COMMUNITY): Payer: Self-pay

## 2015-03-01 ENCOUNTER — Emergency Department (HOSPITAL_COMMUNITY): Payer: Medicare Other

## 2015-03-01 ENCOUNTER — Observation Stay (HOSPITAL_COMMUNITY)
Admission: EM | Admit: 2015-03-01 | Discharge: 2015-03-02 | Disposition: A | Discharge status: Home / Self Care | Payer: Medicare Other | Attending: Internal Medicine | Admitting: Internal Medicine

## 2015-03-01 DIAGNOSIS — Z7902 Long term (current) use of antithrombotics/antiplatelets: Secondary | ICD-10-CM | POA: Diagnosis not present

## 2015-03-01 DIAGNOSIS — I340 Nonrheumatic mitral (valve) insufficiency: Secondary | ICD-10-CM | POA: Diagnosis not present

## 2015-03-01 DIAGNOSIS — R002 Palpitations: Secondary | ICD-10-CM

## 2015-03-01 DIAGNOSIS — Z79899 Other long term (current) drug therapy: Secondary | ICD-10-CM | POA: Diagnosis not present

## 2015-03-01 DIAGNOSIS — I129 Hypertensive chronic kidney disease with stage 1 through stage 4 chronic kidney disease, or unspecified chronic kidney disease: Secondary | ICD-10-CM | POA: Insufficient documentation

## 2015-03-01 DIAGNOSIS — Z7982 Long term (current) use of aspirin: Secondary | ICD-10-CM | POA: Diagnosis not present

## 2015-03-01 DIAGNOSIS — K219 Gastro-esophageal reflux disease without esophagitis: Secondary | ICD-10-CM | POA: Diagnosis not present

## 2015-03-01 DIAGNOSIS — Z87898 Personal history of other specified conditions: Secondary | ICD-10-CM | POA: Diagnosis not present

## 2015-03-01 DIAGNOSIS — R2240 Localized swelling, mass and lump, unspecified lower limb: Secondary | ICD-10-CM | POA: Diagnosis not present

## 2015-03-01 DIAGNOSIS — I2510 Atherosclerotic heart disease of native coronary artery without angina pectoris: Secondary | ICD-10-CM | POA: Diagnosis not present

## 2015-03-01 DIAGNOSIS — R7989 Other specified abnormal findings of blood chemistry: Secondary | ICD-10-CM | POA: Diagnosis present

## 2015-03-01 DIAGNOSIS — I255 Ischemic cardiomyopathy: Secondary | ICD-10-CM | POA: Diagnosis present

## 2015-03-01 DIAGNOSIS — I25118 Atherosclerotic heart disease of native coronary artery with other forms of angina pectoris: Secondary | ICD-10-CM | POA: Diagnosis not present

## 2015-03-01 DIAGNOSIS — I472 Ventricular tachycardia, unspecified: Secondary | ICD-10-CM

## 2015-03-01 DIAGNOSIS — I509 Heart failure, unspecified: Secondary | ICD-10-CM

## 2015-03-01 DIAGNOSIS — R778 Other specified abnormalities of plasma proteins: Secondary | ICD-10-CM | POA: Diagnosis present

## 2015-03-01 DIAGNOSIS — R0602 Shortness of breath: Secondary | ICD-10-CM

## 2015-03-01 DIAGNOSIS — I480 Paroxysmal atrial fibrillation: Secondary | ICD-10-CM | POA: Diagnosis not present

## 2015-03-01 DIAGNOSIS — K5790 Diverticulosis of intestine, part unspecified, without perforation or abscess without bleeding: Secondary | ICD-10-CM | POA: Insufficient documentation

## 2015-03-01 DIAGNOSIS — N183 Chronic kidney disease, stage 3 unspecified: Secondary | ICD-10-CM | POA: Diagnosis present

## 2015-03-01 DIAGNOSIS — I5023 Acute on chronic systolic (congestive) heart failure: Principal | ICD-10-CM | POA: Diagnosis present

## 2015-03-01 DIAGNOSIS — R062 Wheezing: Secondary | ICD-10-CM | POA: Insufficient documentation

## 2015-03-01 DIAGNOSIS — E119 Type 2 diabetes mellitus without complications: Secondary | ICD-10-CM

## 2015-03-01 DIAGNOSIS — Z9861 Coronary angioplasty status: Secondary | ICD-10-CM

## 2015-03-01 DIAGNOSIS — R079 Chest pain, unspecified: Secondary | ICD-10-CM

## 2015-03-01 DIAGNOSIS — Z95810 Presence of automatic (implantable) cardiac defibrillator: Secondary | ICD-10-CM | POA: Diagnosis present

## 2015-03-01 DIAGNOSIS — R0601 Orthopnea: Secondary | ICD-10-CM | POA: Diagnosis not present

## 2015-03-01 DIAGNOSIS — J9601 Acute respiratory failure with hypoxia: Secondary | ICD-10-CM | POA: Diagnosis present

## 2015-03-01 DIAGNOSIS — R0789 Other chest pain: Secondary | ICD-10-CM | POA: Diagnosis not present

## 2015-03-01 DIAGNOSIS — F329 Major depressive disorder, single episode, unspecified: Secondary | ICD-10-CM | POA: Insufficient documentation

## 2015-03-01 DIAGNOSIS — E785 Hyperlipidemia, unspecified: Secondary | ICD-10-CM | POA: Diagnosis not present

## 2015-03-01 DIAGNOSIS — I10 Essential (primary) hypertension: Secondary | ICD-10-CM

## 2015-03-01 DIAGNOSIS — Z87891 Personal history of nicotine dependence: Secondary | ICD-10-CM | POA: Diagnosis not present

## 2015-03-01 DIAGNOSIS — F419 Anxiety disorder, unspecified: Secondary | ICD-10-CM | POA: Diagnosis not present

## 2015-03-01 HISTORY — DX: Anxiety disorder, unspecified: F41.9

## 2015-03-01 LAB — BASIC METABOLIC PANEL
ANION GAP: 12 (ref 5–15)
BUN: 28 mg/dL — ABNORMAL HIGH (ref 6–20)
CALCIUM: 8.9 mg/dL (ref 8.9–10.3)
CO2: 25 mmol/L (ref 22–32)
Chloride: 97 mmol/L — ABNORMAL LOW (ref 101–111)
Creatinine, Ser: 2.53 mg/dL — ABNORMAL HIGH (ref 0.44–1.00)
GFR, EST AFRICAN AMERICAN: 22 mL/min — AB (ref >60)
GFR, EST NON AFRICAN AMERICAN: 19 mL/min — AB (ref >60)
Glucose, Bld: 102 mg/dL — ABNORMAL HIGH (ref 65–99)
Potassium: 4 mmol/L (ref 3.5–5.1)
SODIUM: 134 mmol/L — AB (ref 135–145)

## 2015-03-01 LAB — CBC WITH DIFFERENTIAL/PLATELET
BASOS ABS: 0 10*3/uL (ref 0.0–0.1)
BASOS PCT: 0 %
Eosinophils Absolute: 0.1 10*3/uL (ref 0.0–0.7)
Eosinophils Relative: 1 %
HEMATOCRIT: 33.6 % — AB (ref 36.0–46.0)
HEMOGLOBIN: 11.6 g/dL — AB (ref 12.0–15.0)
Lymphocytes Relative: 11 %
Lymphs Abs: 1 10*3/uL (ref 0.7–4.0)
MCH: 32 pg (ref 26.0–34.0)
MCHC: 34.5 g/dL (ref 30.0–36.0)
MCV: 92.6 fL (ref 78.0–100.0)
Monocytes Absolute: 0.9 10*3/uL (ref 0.1–1.0)
Monocytes Relative: 9 %
NEUTROS ABS: 7.7 10*3/uL (ref 1.7–7.7)
NEUTROS PCT: 79 %
Platelets: 293 10*3/uL (ref 150–400)
RBC: 3.63 MIL/uL — ABNORMAL LOW (ref 3.87–5.11)
RDW: 16 % — AB (ref 11.5–15.5)
WBC: 9.8 10*3/uL (ref 4.0–10.5)

## 2015-03-01 LAB — TROPONIN I
TROPONIN I: 0.22 ng/mL — AB (ref <0.031)
TROPONIN I: 0.16 ng/mL — AB (ref <0.031)
Troponin I: 0.19 ng/mL — ABNORMAL HIGH (ref <0.031)

## 2015-03-01 LAB — GLUCOSE, CAPILLARY
GLUCOSE-CAPILLARY: 130 mg/dL — AB (ref 65–99)
Glucose-Capillary: 125 mg/dL — ABNORMAL HIGH (ref 65–99)

## 2015-03-01 LAB — BRAIN NATRIURETIC PEPTIDE: B NATRIURETIC PEPTIDE 5: 452 pg/mL — AB (ref 0.0–100.0)

## 2015-03-01 MED ORDER — ONDANSETRON HCL 4 MG PO TABS
4.0000 mg | ORAL_TABLET | Freq: Four times a day (QID) | ORAL | Status: DC | PRN
  Administered 2015-03-01: 4 mg via ORAL
  Filled 2015-03-01: qty 1

## 2015-03-01 MED ORDER — ISOSORBIDE MONONITRATE ER 30 MG PO TB24
30.0000 mg | ORAL_TABLET | Freq: Every day | ORAL | Status: DC
  Administered 2015-03-02: 30 mg via ORAL
  Filled 2015-03-01: qty 1

## 2015-03-01 MED ORDER — CLOPIDOGREL BISULFATE 75 MG PO TABS
75.0000 mg | ORAL_TABLET | Freq: Every day | ORAL | Status: DC
  Administered 2015-03-01: 75 mg via ORAL
  Filled 2015-03-01: qty 1

## 2015-03-01 MED ORDER — ATORVASTATIN CALCIUM 40 MG PO TABS
40.0000 mg | ORAL_TABLET | Freq: Every day | ORAL | Status: DC
  Administered 2015-03-01: 40 mg via ORAL
  Filled 2015-03-01: qty 1

## 2015-03-01 MED ORDER — INSULIN ASPART 100 UNIT/ML ~~LOC~~ SOLN: 0.0000 [IU] | Freq: Every day | SUBCUTANEOUS | Status: DC

## 2015-03-01 MED ORDER — DIPHENHYDRAMINE HCL 25 MG PO CAPS
25.0000 mg | ORAL_CAPSULE | Freq: Once | ORAL | Status: AC
  Administered 2015-03-02: 25 mg via ORAL
  Filled 2015-03-01: qty 1

## 2015-03-01 MED ORDER — INSULIN ASPART 100 UNIT/ML ~~LOC~~ SOLN
0.0000 [IU] | Freq: Three times a day (TID) | SUBCUTANEOUS | Status: DC
  Administered 2015-03-01: 1 [IU] via SUBCUTANEOUS

## 2015-03-01 MED ORDER — RANOLAZINE ER 500 MG PO TB12
500.0000 mg | ORAL_TABLET | Freq: Two times a day (BID) | ORAL | Status: DC
  Administered 2015-03-01 – 2015-03-02 (×2): 500 mg via ORAL
  Filled 2015-03-01 (×2): qty 1

## 2015-03-01 MED ORDER — ALUM & MAG HYDROXIDE-SIMETH 200-200-20 MG/5ML PO SUSP
30.0000 mL | Freq: Four times a day (QID) | ORAL | Status: DC | PRN
  Administered 2015-03-01 – 2015-03-02 (×3): 30 mL via ORAL
  Filled 2015-03-01 (×3): qty 30

## 2015-03-01 MED ORDER — FUROSEMIDE 10 MG/ML IJ SOLN: 40.0000 mg | Freq: Every day | INTRAMUSCULAR | Status: DC

## 2015-03-01 MED ORDER — ONDANSETRON HCL 4 MG/2ML IJ SOLN: 4.0000 mg | Freq: Four times a day (QID) | INTRAMUSCULAR | Status: DC | PRN

## 2015-03-01 MED ORDER — NITROGLYCERIN 0.4 MG SL SUBL
0.4000 mg | SUBLINGUAL_TABLET | SUBLINGUAL | Status: DC | PRN
  Administered 2015-03-02: 0.4 mg via SUBLINGUAL
  Filled 2015-03-01: qty 1

## 2015-03-01 MED ORDER — PANTOPRAZOLE SODIUM 40 MG PO TBEC
40.0000 mg | DELAYED_RELEASE_TABLET | Freq: Two times a day (BID) | ORAL | Status: DC
  Administered 2015-03-01 – 2015-03-02 (×3): 40 mg via ORAL
  Filled 2015-03-01 (×3): qty 1

## 2015-03-01 MED ORDER — HYDRALAZINE HCL 10 MG PO TABS
10.0000 mg | ORAL_TABLET | Freq: Three times a day (TID) | ORAL | Status: DC
  Administered 2015-03-01 – 2015-03-02 (×2): 10 mg via ORAL
  Filled 2015-03-01 (×3): qty 1

## 2015-03-01 MED ORDER — CARVEDILOL 3.125 MG PO TABS
6.2500 mg | ORAL_TABLET | Freq: Two times a day (BID) | ORAL | Status: DC
  Administered 2015-03-02: 6.25 mg via ORAL
  Filled 2015-03-01 (×2): qty 2

## 2015-03-01 MED ORDER — ADULT MULTIVITAMIN W/MINERALS CH
ORAL_TABLET | Freq: Every day | ORAL | Status: DC
  Administered 2015-03-02: 1 via ORAL
  Filled 2015-03-01: qty 1

## 2015-03-01 MED ORDER — ENOXAPARIN SODIUM 30 MG/0.3ML ~~LOC~~ SOLN
30.0000 mg | SUBCUTANEOUS | Status: DC
  Administered 2015-03-01: 30 mg via SUBCUTANEOUS
  Filled 2015-03-01: qty 0.3

## 2015-03-01 MED ORDER — TRAMADOL HCL 50 MG PO TABS
50.0000 mg | ORAL_TABLET | Freq: Two times a day (BID) | ORAL | Status: DC | PRN
  Administered 2015-03-02: 50 mg via ORAL
  Filled 2015-03-01 (×4): qty 1

## 2015-03-01 MED ORDER — ACETAMINOPHEN 325 MG PO TABS
650.0000 mg | ORAL_TABLET | Freq: Four times a day (QID) | ORAL | Status: DC | PRN
Start: 2015-03-01 — End: 2015-03-02
  Administered 2015-03-02: 650 mg via ORAL
  Filled 2015-03-01: qty 2

## 2015-03-01 MED ORDER — ASPIRIN EC 81 MG PO TBEC
81.0000 mg | DELAYED_RELEASE_TABLET | Freq: Every day | ORAL | Status: DC
  Administered 2015-03-02: 81 mg via ORAL
  Filled 2015-03-01 (×2): qty 1

## 2015-03-01 MED ORDER — ACETAMINOPHEN 650 MG RE SUPP: 650.0000 mg | Freq: Four times a day (QID) | RECTAL | Status: DC | PRN

## 2015-03-01 MED ORDER — MORPHINE SULFATE (PF) 2 MG/ML IV SOLN
2.0000 mg | INTRAVENOUS | Status: DC | PRN
  Administered 2015-03-01: 2 mg via INTRAVENOUS
  Filled 2015-03-01: qty 1

## 2015-03-01 MED ORDER — AMIODARONE HCL 200 MG PO TABS
200.0000 mg | ORAL_TABLET | Freq: Two times a day (BID) | ORAL | Status: DC
  Administered 2015-03-01 – 2015-03-02 (×2): 200 mg via ORAL
  Filled 2015-03-01 (×3): qty 1

## 2015-03-01 MED ORDER — FUROSEMIDE 40 MG PO TABS: 40.0000 mg | ORAL_TABLET | Freq: Every day | ORAL | Status: DC

## 2015-03-01 MED ORDER — ALBUTEROL SULFATE (2.5 MG/3ML) 0.083% IN NEBU
2.5000 mg | INHALATION_SOLUTION | RESPIRATORY_TRACT | Status: DC | PRN
  Administered 2015-03-02: 2.5 mg via RESPIRATORY_TRACT
  Filled 2015-03-01: qty 3

## 2015-03-01 MED ORDER — FUROSEMIDE 10 MG/ML IJ SOLN
40.0000 mg | Freq: Once | INTRAMUSCULAR | Status: AC
  Administered 2015-03-01: 40 mg via INTRAVENOUS
  Filled 2015-03-01: qty 4

## 2015-03-01 MED ORDER — SUCRALFATE 1 GM/10ML PO SUSP
1.0000 g | Freq: Three times a day (TID) | ORAL | Status: DC
  Administered 2015-03-01 – 2015-03-02 (×4): 1 g via ORAL
  Filled 2015-03-01 (×4): qty 10

## 2015-03-01 MED ORDER — IPRATROPIUM-ALBUTEROL 0.5-2.5 (3) MG/3ML IN SOLN
3.0000 mL | Freq: Once | RESPIRATORY_TRACT | Status: AC
  Administered 2015-03-01: 3 mL via RESPIRATORY_TRACT
  Filled 2015-03-01: qty 3

## 2015-03-01 MED ORDER — GLIPIZIDE ER 2.5 MG PO TB24
2.5000 mg | ORAL_TABLET | Freq: Every day | ORAL | Status: DC
  Filled 2015-03-01 (×2): qty 1

## 2015-03-01 NOTE — ED Notes (Signed)
Dr. Alvino Chapel in room to see pt.  States he is calling cardiology on call.

## 2015-03-01 NOTE — ED Notes (Signed)
Per EMS, called out for pt being SOB and chest pain. Pt was given 324 mg asa and NTG x1 prior to arrival. Pt was discharged from [facility] yesterday.

## 2015-03-01 NOTE — ED Provider Notes (Signed)
CSN: [id number]     Arrival date & time 03/01/15  0810 History   First MD Initiated Contact with Patient 03/01/15 0820     Chief Complaint  Patient presents with  . Shortness of Breath     (Consider location/radiation/quality/duration/timing/severity/associated sxs/prior Treatment) Patient is a 63 y.o. female presenting with shortness of breath. The history is provided by the patient.  Shortness of Breath Associated symptoms: chest pain and wheezing   Associated symptoms: no abdominal pain, no headaches, no rash and no vomiting    patient was discharged from [place] yesterday after CHF and a parent non-STEMI. Also had some abdominal pain. Patient states she's had worsening shortness of breath. States she's had to sleep more upright than she normally does. She's had a cough with some mild amount of blood. States she felt as if she had some fevers but does not appear to monitor. She's also had some left-sided chest pain. She states is similar to the symptoms she had when she was in the hospital. Initial pulse ox of 89%. She is not on oxygen at home. No relief with any medication at home.  Past Medical History  Diagnosis Date  . Ischemic cardiomyopathy     a. s/p MDT dual chamber ICD implanted 2013 by Dr Westley Gambles at [facility], now followed by Dr Rayann Heman. b. LVEF 30-35% by echo 01/2015 at [facility] per report; 35-40% by echo 01/2015 at [facility].  Marland Kitchen History of stroke      a. 1993  . Coronary artery disease     a. s/p previous interventions in [place] per patient, no records available. b. cath 04/2013 with non-obstructive disease. c. STEMI 01/2015 @ [place] - cardiac cath had shown 100% stenosis of prox PDA, patent stent in RCA with otherwise normal coronary arteries.  . Lymphoma ([place])     a. s/p chemo/radiation 2009  . Chronic systolic CHF (congestive heart failure) ([place])   . HTN (hypertension)   . HLD (hyperlipidemia)   . Ventricular tachycardia ([place])     a. CL 300 msec requiring ICD shocks  therpay 5/14 b. recurrent VT 04/2014, placed on amiodarone c. recurrent VT 05/2014 s/p ablation by Dr Rayann Heman d. s/p repeat ablation 07/2014  . DM2 (diabetes mellitus, type 2), newly diagnosed    . CKD (chronic kidney disease) stage 3, GFR 30-59 ml/min    . Polymyalgia rheumatica ([place])   . Depression   . GERD (gastroesophageal reflux disease)   . Renal atrophy, left     a. [place] 02/2015: "Left renal atrophy, likely from chronic renal arterial stenosis"  . Mitral regurgitation     a. mod by echo 01/2015.  . Diverticulosis     a. By CT 02/2015.  Marland Kitchen PAF (paroxysmal atrial fibrillation) ([place])     a. Brief paroxysms during admission 02/2015 - amiodarone increased. Not placed on anticoag due to dual antiplatelet therapy and bleeding risk including in the setting of possible GI pathology.   . Hiatal hernia     a. by EGD 06/2014.  Marland Kitchen Gastritis     a. by EGD 06/2014.   Past Surgical History  Procedure Laterality Date  . Cardiac defibrillator placement  03/2011    MDT ICD implanted at [facility] by Dr Tana [place]  . Coronary angioplasty with stent placement    . Left and right heart catheterization with coronary angiogram N/A 05/03/2013    non-obstructive CAD  . V-tach ablation N/A 05/26/2014    VT ablation by Dr Rayann Heman - PVCs arising from the  inferolateral LV, extensive substrate ablation  . Electrophysiologic study N/A 08/04/2014    Procedure: V Tach Ablation;  Surgeon: Thompson Grayer, MD;  Location: [place] [facility];  Service: Cardiovascular;  Laterality: N/A;   Family History  Problem Relation Age of Onset  . Diabetes Father   . Hypertension Father   . Heart disease Father   . Alcoholism Father   . Hypertension Mother   . Heart disease Mother   . Breast cancer Mother   . Stroke Mother   . Diabetes Brother   . Diabetes Brother   . Diabetes Brother   . Diabetes Brother   . Hypertension Sister   . Heart disease Sister   . Alcoholism Sister   . Thyroid disease Sister   . Heart attack Sister   . Heart  attack Brother   . Stroke Brother    Social History  Substance Use Topics  . Smoking status: Former Smoker -- 0.25 packs/day    Quit date: 05/20/2014  . Smokeless tobacco: Never Used     Comment: she is not ready to quit  . Alcohol Use: No   OB History    No data available     Review of Systems  Constitutional: Negative for activity change and appetite change.  Eyes: Negative for pain.  Respiratory: Positive for chest tightness, shortness of breath and wheezing.        Orthopnea  Cardiovascular: Positive for chest pain and leg swelling.  Gastrointestinal: Negative for nausea, vomiting, abdominal pain and diarrhea.  Genitourinary: Negative for flank pain.  Musculoskeletal: Negative for back pain and neck stiffness.  Skin: Negative for rash.  Neurological: Negative for weakness, numbness and headaches.  Psychiatric/Behavioral: Negative for behavioral problems.      Allergies  Flagyl; Contrast media; Ioxaglate; Potassium sulfate; and Potassium-containing compounds  Home Medications   Prior to Admission medications   Medication Sig Start Date End Date Taking? Authorizing Provider  amiodarone (PACERONE) 200 MG tablet Take 1 tablet (200 mg total) by mouth 2 (two) times daily. 02/28/15  Yes Dayna N Dunn, PA-C  aspirin EC 81 MG tablet Take 81 mg by mouth daily.   Yes Historical Provider, MD  atorvastatin (LIPITOR) 40 MG tablet Take 1 tablet (40 mg total) by mouth daily at 6 PM. 06/20/14  Yes Thompson Grayer, MD  carvedilol (COREG) 6.25 MG tablet Take 6.25 mg by mouth 2 (two) times daily with a meal.   Yes Historical Provider, MD  clopidogrel (PLAVIX) 75 MG tablet Take 75 mg by mouth at bedtime.    Yes Historical Provider, MD  furosemide (LASIX) 40 MG tablet Take 1 tablet (40 mg total) by mouth daily. 08/18/14  Yes Amber Sena Slate, NP  glipiZIDE (GLUCOTROL XL) 2.5 MG 24 hr tablet Take 2.5 mg by mouth daily with breakfast.   Yes Historical Provider, MD  hydrALAZINE (APRESOLINE) 10 MG  tablet Take 1 tablet (10 mg total) by mouth every 8 (eight) hours. 02/28/15  Yes Dayna N Dunn, PA-C  isosorbide mononitrate (IMDUR) 30 MG 24 hr tablet Take 1 tablet (30 mg total) by mouth daily. 02/28/15  Yes Dayna N Dunn, PA-C  Multiple Vitamin (MULTI VITAMIN DAILY PO) Take 1 tablet by mouth daily.   Yes Historical Provider, MD  pantoprazole (PROTONIX) 40 MG tablet Take 1 tablet (40 mg total) by mouth 2 (two) times daily. 02/28/15  Yes Dayna N Dunn, PA-C  ranolazine (RANEXA) 500 MG 12 hr tablet Take 1 tablet (500 mg total) by mouth 2 (  two) times daily. 02/28/15  Yes Dayna N Dunn, PA-C  sucralfate (CARAFATE) 1 GM/10ML suspension Take 10 mLs (1 g total) by mouth 4 (four) times daily -  with meals and at bedtime. 02/28/15  Yes Dayna N Dunn, PA-C  traMADol (ULTRAM) 50 MG tablet Take 1 tablet (50 mg total) by mouth every 12 (twelve) hours as needed for moderate pain or severe pain. 02/28/15  Yes Dayna N Dunn, PA-C  nitroGLYCERIN (NITROSTAT) 0.4 MG SL tablet Place 1 tablet (0.4 mg total) under the tongue every 5 (five) minutes as needed for chest pain. Patient not taking: Reported on 03/01/2015 07/04/14   Patsey Berthold, NP   BP 110/93 mmHg  Pulse 99  Temp(Src) 97.8 F (36.6 C) (Oral)  Resp 16  Ht 5\' 5"  (1.651 m)  Wt 200 lb (90.719 kg)  BMI 33.28 kg/m2  SpO2 94% Physical Exam  Constitutional: She appears well-developed.  HENT:  Head: Normocephalic.  Eyes: EOM are normal.  Neck: No JVD present.  Cardiovascular: Normal rate.   Pulmonary/Chest:  Wheezes and harsh breath sounds at bilateral bases.  Abdominal: There is no tenderness.  Musculoskeletal: She exhibits edema.  Mild edema to bilateral lower extremities.  Neurological: She is alert.  Skin: Skin is warm.    ED Course  Procedures (including critical care time) Labs Review Labs Reviewed  BASIC METABOLIC PANEL - Abnormal; Notable for the following:    Sodium 134 (*)    Chloride 97 (*)    Glucose, Bld 102 (*)    BUN 28 (*)     Creatinine, Ser 2.53 (*)    GFR calc non Af Amer 19 (*)    GFR calc Af Amer 22 (*)    All other components within normal limits  CBC WITH DIFFERENTIAL/PLATELET - Abnormal; Notable for the following:    RBC 3.63 (*)    Hemoglobin 11.6 (*)    HCT 33.6 (*)    RDW 16.0 (*)    All other components within normal limits  BRAIN NATRIURETIC PEPTIDE - Abnormal; Notable for the following:    B Natriuretic Peptide 452.0 (*)    All other components within normal limits  TROPONIN I - Abnormal; Notable for the following:    Troponin I 0.22 (*)    All other components within normal limits    Imaging Review Dg Chest Portable 1 View  03/01/2015  CLINICAL DATA:  64 year old female with shortness of breath and chest pain radiating from the sternum to the left side. Myocardial infarction 2 weeks ago. Former smoker. Initial encounter. EXAM: PORTABLE CHEST 1 VIEW COMPARISON:  02/22/2015 and earlier. FINDINGS: Portable AP upright view at 0846 hours. Increased basilar predominant interstitial opacity since the prior. No pneumothorax or pleural effusion. Mildly lower lung volumes. No consolidation. Stable cardiomegaly and mediastinal contours. Stable left chest cardiac AICD. Visualized tracheal air column is within normal limits. IMPRESSION: Increased basilar predominant interstitial opacity, favor acute interstitial edema. No other acute cardiopulmonary abnormality. Electronically Signed   By: Genevie [name] M.D.   On: 03/01/2015 09:10   I have personally reviewed and evaluated these images and lab results as part of my medical decision-making.   EKG Interpretation   Date/Time:  Wednesday March 01 2015 08:16:45 EST Ventricular Rate:  91 PR Interval:    QRS Duration: 118 QT Interval:  423 QTC Calculation: 520 R Axis:   -3 Text Interpretation:  Sinus rhythm Nonspecific intraventricular conduction  delay Nonspecific T abnormalities, lateral leads No significant change  since  last tracing Confirmed by Alvino Chapel   MD, Ovid Curd [phone]) on 03/01/2015  8:25:28 AM      MDM   Final diagnoses:  Acute on chronic congestive heart failure, unspecified congestive heart failure type [facility])    Patient resents with worsening shortness of breath. Discharged yesterday from [place] for CHF. Cannot lay is flat. Weight is actually down 1 pound since yesterday. BNP is elevated. Chest x-ray looks slightly worse than previous. Has a new oxygen requirement. Discussed with internal medicine, who recommends cardiology consult and recommends Lasix. Given before admission. Discussed with Dr.Koneswaran, who recommended IV Lasix and increasing by mouth Lasix. If improved may be able be discharged home with follow-up tomorrow.    Patient has had somewhat improvement of her saturations after the IV Lasix, however has not felt better. States she is still dyspneic. States she cannot go home like this. We discussed with cardiology and patient will be admitted to internal medicine.  Davonna Belling, MD 03/01/15 1534

## 2015-03-01 NOTE — ED Notes (Signed)
In room to turn oxygen off.  Pt states she can not get up and go home.  States she is still short of breath.

## 2015-03-01 NOTE — ED Notes (Signed)
sats 94% on 1 liters.

## 2015-03-01 NOTE — Telephone Encounter (Signed)
Pt currently admitted to [facility].

## 2015-03-01 NOTE — H&P (Addendum)
[facility] History and Physical  [name] Terry [id number] DOB: 1950/12/14 DOA: 03/01/2015  Referring physician: ED physician, Dr. Alvino Chapel PCP: Bronson Curb, PA-C   Chief Complaint: Shortness of breath  HPI: [name] Terry is a 64 y.o. female with a history of chronic systolic congestive heart failure, ischemic cardiomyopathy status post ICD placement in 2013, CAD with a history of a recent STEMI in November 2016-treated medically at [facility] with a cardiac catheterization showing 100% stenosis of the proximal PDA; GERD, CKD, and diabetes mellitus with nephropathy. The patient was also just discharged from [facility] yesterday after an 8 day hospitalization for treatment of acute on chronic systolic congestive heart failure and chest pain. The patient was discharged on excellent medical therapy. She returns today with a complaint of progressive shortness of breath that started this morning. She does not endorse outright orthopnea or PND, but mostly dyspnea on exertion. She denies any increase in swelling of her legs or her abdomen. She had some central chest pain at home which was relieved with one sublingual nitroglycerin. When questioned about her salt intake, she did not readily admit adding salt to her foods, but her daughter stated that she ate a processed chicken pot pie last night.  In the ED, the patient was afebrile and hemodynamically stable. With short ambulation on room air, her oxygen saturations fell to 87% and improved to 94% on 2 L of nasal cannula oxygen. Her lab data were significant for a troponin I of 0.22 compared to 0.63 on 02/26/15; BNP of 452 compared to 464 on 02/21/15; creatinine of 2.53, hemoglobin of 11.6, and glucose of 102. Her chest x-ray revealed increased basilar predominant interstitial opacity favoring interstitial edema without any other acute cardiopulmonary abnormality. EKG revealed sinus rhythm, heart rate 91 bpm, nonspecific T-wave  abnormalities. She is being admitted for further evaluation and management.    Review of Systems:  She has chronic shortness of breath, chest pain, gastroesophageal reflux. Otherwise review systems is negative.  Past Medical History  Diagnosis Date  . Ischemic cardiomyopathy     a. s/p MDT dual chamber ICD implanted 2013 by Dr Westley Gambles at [facility], now followed by Dr Rayann Heman. b. LVEF 30-35% by echo 01/2015 at [facility] per report; 35-40% by echo 01/2015 at [facility].  Marland Kitchen History of stroke      a. 1993  . Coronary artery disease     a. s/p previous interventions in [place] per patient, no records available. b. cath 04/2013 with non-obstructive disease. c. STEMI 01/2015 @ [place] - cardiac cath had shown 100% stenosis of prox PDA, patent stent in RCA with otherwise normal coronary arteries.  . Lymphoma ([place])     a. s/p chemo/radiation 2009  . Chronic systolic CHF (congestive heart failure) ([place])   . HTN (hypertension)   . HLD (hyperlipidemia)   . Ventricular tachycardia ([place])     a. CL 300 msec requiring ICD shocks therpay 5/14 b. recurrent VT 04/2014, placed on amiodarone c. recurrent VT 05/2014 s/p ablation by Dr Rayann Heman d. s/p repeat ablation 07/2014  . DM2 (diabetes mellitus, type 2), newly diagnosed    . CKD (chronic kidney disease) stage 3, GFR 30-59 ml/min    . Polymyalgia rheumatica ([place])   . Depression   . GERD (gastroesophageal reflux disease)   . Renal atrophy, left     a. [place] 02/2015: "Left renal atrophy, likely from chronic renal arterial stenosis"  . Mitral regurgitation     a. mod by echo 01/2015.  Marland Kitchen  Diverticulosis     a. By CT 02/2015.  Marland Kitchen PAF (paroxysmal atrial fibrillation) ([place])     a. Brief paroxysms during admission 02/2015 - amiodarone increased. Not placed on anticoag due to dual antiplatelet therapy and bleeding risk including in the setting of possible GI pathology.   . Hiatal hernia     a. by EGD 06/2014.  Marland Kitchen Gastritis     a. by EGD 06/2014.   Past Surgical History    Procedure Laterality Date  . Cardiac defibrillator placement  03/2011    MDT ICD implanted at [facility] by Dr Tana Coast  . Coronary angioplasty with stent placement    . Left and right heart catheterization with coronary angiogram N/A 05/03/2013    non-obstructive CAD  . V-tach ablation N/A 05/26/2014    VT ablation by Dr Rayann Heman - PVCs arising from the inferolateral LV, extensive substrate ablation  . Electrophysiologic study N/A 08/04/2014    Procedure: V Tach Ablation;  Surgeon: Thompson Grayer, MD;  Location: [facility];  Service: Cardiovascular;  Laterality: N/A;   Social History: She is divorced. She has 2 children. She is disabled. She  reports that she quit smoking about 9 months ago. She has never used smokeless tobacco. She reports that she does not drink alcohol or use illicit drugs.  Allergies  Allergen Reactions  . Flagyl [Metronidazole] Swelling and Rash    Face swells   . Contrast Media [Iodinated Diagnostic Agents] Rash  . Ioxaglate Rash  . Potassium Sulfate Rash and Other (See Comments)    Headaches  . Potassium-Containing Compounds Other (See Comments)    Headaches-- reports no problems now    Family History  Problem Relation Age of Onset  . Diabetes Father   . Hypertension Father   . Heart disease Father   . Alcoholism Father   . Hypertension Mother   . Heart disease Mother   . Breast cancer Mother   . Stroke Mother   . Diabetes Brother   . Diabetes Brother   . Diabetes Brother   . Diabetes Brother   . Hypertension Sister   . Heart disease Sister   . Alcoholism Sister   . Thyroid disease Sister   . Heart attack Sister   . Heart attack Brother   . Stroke Brother     Prior to Admission medications   Medication Sig Start Date End Date Taking? Authorizing Provider  amiodarone (PACERONE) 200 MG tablet Take 1 tablet (200 mg total) by mouth 2 (two) times daily. 02/28/15  Yes Dayna N Dunn, PA-C  aspirin EC 81 MG tablet Take 81 mg by mouth daily.   Yes Historical  Provider, MD  atorvastatin (LIPITOR) 40 MG tablet Take 1 tablet (40 mg total) by mouth daily at 6 PM. 06/20/14  Yes Thompson Grayer, MD  carvedilol (COREG) 6.25 MG tablet Take 6.25 mg by mouth 2 (two) times daily with a meal.   Yes Historical Provider, MD  clopidogrel (PLAVIX) 75 MG tablet Take 75 mg by mouth at bedtime.    Yes Historical Provider, MD  furosemide (LASIX) 40 MG tablet Take 1 tablet (40 mg total) by mouth daily. 08/18/14  Yes Amber Sena Slate, NP  glipiZIDE (GLUCOTROL XL) 2.5 MG 24 hr tablet Take 2.5 mg by mouth daily with breakfast.   Yes Historical Provider, MD  hydrALAZINE (APRESOLINE) 10 MG tablet Take 1 tablet (10 mg total) by mouth every 8 (eight) hours. 02/28/15  Yes Dayna N Dunn, PA-C  isosorbide mononitrate (IMDUR)  30 MG 24 hr tablet Take 1 tablet (30 mg total) by mouth daily. 02/28/15  Yes Dayna N Dunn, PA-C  Multiple Vitamin (MULTI VITAMIN DAILY PO) Take 1 tablet by mouth daily.   Yes Historical Provider, MD  pantoprazole (PROTONIX) 40 MG tablet Take 1 tablet (40 mg total) by mouth 2 (two) times daily. 02/28/15  Yes Dayna N Dunn, PA-C  ranolazine (RANEXA) 500 MG 12 hr tablet Take 1 tablet (500 mg total) by mouth 2 (two) times daily. 02/28/15  Yes Dayna N Dunn, PA-C  sucralfate (CARAFATE) 1 GM/10ML suspension Take 10 mLs (1 g total) by mouth 4 (four) times daily -  with meals and at bedtime. 02/28/15  Yes Dayna N Dunn, PA-C  traMADol (ULTRAM) 50 MG tablet Take 1 tablet (50 mg total) by mouth every 12 (twelve) hours as needed for moderate pain or severe pain. 02/28/15  Yes Dayna N Dunn, PA-C  nitroGLYCERIN (NITROSTAT) 0.4 MG SL tablet Place 1 tablet (0.4 mg total) under the tongue every 5 (five) minutes as needed for chest pain. Patient not taking: Reported on 03/01/2015 07/04/14   Patsey Berthold, NP   Physical Exam: Filed Vitals:   03/01/15 1100 03/01/15 1130 03/01/15 1230 03/01/15 1250  BP: 93/75 117/79 108/90 110/93  Pulse: 81  83 99  Temp:    97.8 F (36.6 C)  TempSrc:    Oral    Resp: 15 23 22 16   Height:      Weight:      SpO2: 90%  94% 94%    Wt Readings from Last 3 Encounters:  03/01/15 90.719 kg (200 lb)  02/28/15 91.581 kg (201 lb 14.4 oz)  12/26/14 88.361 kg (194 lb 12.8 oz)    General:  Appears calm and comfortable; pleasant alert 64 year old African-American woman in no acute distress. Eyes: PERRL, normal lids, irises & conjunctiva; conjunctivae are clear and sclerae are white. ENT: grossly normal hearing, lips & tongue; oropharynx reveals mild dry mucous membranes. Neck: no LAD, masses or thyromegaly Cardiovascular: S1, S2, with occasional ectopy and a soft systolic murmur. No LE edema. Telemetry: SR, occasional PVCs Respiratory: CTA bilaterally, no w/r/r. Normal respiratory effort. Abdomen: soft; mildly obese positive bowel sounds no distention or masses palpated; nontender nondistended. Skin: no rash or induration seen on limited exam Musculoskeletal: grossly normal tone BUE/BLE Psychiatric: grossly normal mood and affect, speech fluent and appropriate Neurologic: grossly non-focal.          Labs on Admission:  Basic Metabolic Panel:  Recent Labs Lab 02/25/15 0914 02/26/15 1402 02/27/15 0444 02/28/15 0436 03/01/15 0838  NA 132* 134* 136 132* 134*  K 4.0 4.1 3.8 4.2 4.0  CL 100* 98* 100* 99* 97*  CO2 22 27 28 27 25   GLUCOSE 190* 162* 78 110* 102*  BUN 27* 21* 19 21* 28*  CREATININE 2.34* 2.19* 2.06* 2.31* 2.53*  CALCIUM 8.6* 8.7* 8.8* 8.7* 8.9  MG  --  2.4 2.6*  --   --    Liver Function Tests:  Recent Labs Lab 02/25/15 0914 02/26/15 1520  AST 44* 56*  ALT 33 55*  ALKPHOS 52 61  BILITOT 0.6  --   PROT 6.7  --   ALBUMIN 2.9*  --     Recent Labs Lab 02/25/15 0914 02/26/15 1520  LIPASE  --  38  AMYLASE 102* 95   No results for input(s): AMMONIA in the last 168 hours. CBC:  Recent Labs Lab 02/26/15 1402 03/01/15 0838  WBC 14.8* 9.8  NEUTROABS  --  7.7  HGB 10.7* 11.6*  HCT 32.3* 33.6*  MCV 93.1 92.6  PLT  278 293   Cardiac Enzymes:  Recent Labs Lab 02/25/15 1636 02/25/15 2127 03/01/15 0838  TROPONINI 0.66* 0.63* 0.22*    BNP (last 3 results)  Recent Labs  02/20/15 1645 02/21/15 1042 03/01/15 0839  BNP 463.9* 386.1* 452.0*    ProBNP (last 3 results) No results for input(s): PROBNP in the last 8760 hours.  CBG:  Recent Labs Lab 02/26/15 1120 02/26/15 1646 02/27/15 2034 02/28/15 0733 02/28/15 1112  GLUCAP 135* 136* 90 113* 141*    Radiological Exams on Admission: Dg Chest Portable 1 View  03/01/2015  CLINICAL DATA:  64 year old female with shortness of breath and chest pain radiating from the sternum to the left side. Myocardial infarction 2 weeks ago. Former smoker. Initial encounter. EXAM: PORTABLE CHEST 1 VIEW COMPARISON:  02/22/2015 and earlier. FINDINGS: Portable AP upright view at 0846 hours. Increased basilar predominant interstitial opacity since the prior. No pneumothorax or pleural effusion. Mildly lower lung volumes. No consolidation. Stable cardiomegaly and mediastinal contours. Stable left chest cardiac AICD. Visualized tracheal air column is within normal limits. IMPRESSION: Increased basilar predominant interstitial opacity, favor acute interstitial edema. No other acute cardiopulmonary abnormality. Electronically Signed   By: Genevie [name] M.D.   On: 03/01/2015 09:10    EKG: Independently reviewed.   Assessment/Plan Principal Problem:   Acute on chronic systolic (congestive) heart failure (HCC) Active Problems:   AICD (automatic cardioverter/defibrillator) present, dual chamber medtronic   Cardiomyopathy, ischemic-EF 35-40%   CAD S/P prior RCA PCI, cath 05/03/2013- med Rx, STEMI 01/2015 - 100% prox PDA, treated medically   DM2 with nephropathy   CKD (chronic kidney disease) stage 3, GFR 30-59 ml/min   Elevated troponin   PAF (paroxysmal atrial fibrillation) (HCC)   Acute on chronic systolic CHF (congestive heart failure) ([place])   64 year old woman with  PAF, chronic systolic congestive heart failure, ischemic cardiomyopathy with an ejection fraction of 35-40%, CAD with a recent STEMI in July 2016 with noted total occlusion proximal PDA-[facility] medically at [facility], history of ventricular tachycardia-status post ablation 04/2014 and 07/2014; status post ICD in 2013 at [facility] who presents with shortness of breath and mild acute on chronic systolic heart failure causing acute hypoxic respiratory failure. Incidentally, the patient was just discharged from [facility] on 02/28/15 after being hospitalized for 8 days. The pulmonary edema may have been exacerbated by a high salt load from the chicken pot pie she ate yesterday. Her troponin I has trended downward and her BNP is slightly improved compared to the previous hospitalization. Cardiology was consulted and agreed with IV Lasix and medical management overnight.  Plan: -The patient will be admitted for observation overnight. -The patient was given 40 mg of IV Lasix in the ED. She feels better and her oxygen saturation has improved. We'll continue 40 mg of Lasix IV for 1 more dose tomorrow morning before transitioning it to by mouth. -We will continue oxygen supplementation started. With titrated to keep her oxygen saturations 90% or greater. Will assess her oxygen saturation on room air with ambulation tomorrow to see if she qualifies for home oxygen. -We'll continue all of her chronic cardiac medications. -Strict ins and outs and daily weights. -We'll hold glipizide and treat her diabetes with sliding scale NovoLog. -Will continue PPI and H2 blocker and Carafate for chronic GERD and dyspepsia. -We'll continue to cycle cardiac enzymes.    Code Status: Full code  DVT Prophylaxis: Lovenox Family Communication: Discussed with her daughter Disposition Plan: Anticipate discharge to home tomorrow.  Time spent: One hour.  [facility] Pager [phone]

## 2015-03-01 NOTE — Consult Note (Signed)
Reason for Consult:CHF Referring Physician:PTH Primary Cardiologist: Dr. Johnsie Cancel Primary EP: Dr. Aurea Graff [name] Terry is an 64 y.o. female.  HPI: This is a 64 year old female patient who was discharged from [facility] yesterday afternoon admission with CHF and abdominal pain.She has a history of CAD, HTN, ICM s/p MDT dual chamber ICD 2013, lymphoma s/p chemo/radiation 7858, chronic systolic CHF, status post VT ablation 2, HTN, HLD, VT, DM, CKD stage III, PMR and depression. The patient was recently admitted to [facility] 01/2015 for inferior wall STEMI - cardiac cath had shown 100% stenosis of prox PDA, patent stent in RCA with otherwise normal coronary arteries. Echocardiogram showed EF 30-35%. Post cath patient developed pulmonary edema, hemoptysis and pneumonia. She was treated with IV Lasix and discharged home 02/19/15 with prescription for clindamycin.   At [facility] VQ scan was negative for PE, Ranexa was added and cath films were reviewed by interventional-who agreed that her coronary lesion could not be reopened. GI saw the patient and there was a question of mesenteric ischemia but a CTA could not be done because of her renal function. She was treated medically. She did have brief PAF and Dr. Rayann Heman recommended increasing her amiodarone to 200 mg twice a day. She was not placed on anticoagulation  given dual antiplatelet therapy and bleeding risk with all her GI symptoms. She remained in sinus rhythm on the rest of the hospitalization. No Ace or ARB with CKD.  Patient comes in today worsening shortness of breath. BNP 452. Troponin still up at 0.22, creatinine 2.53, chest x-ray showed interstitial edema. She also said she is having chest pain. It was relieved with one nitroglycerin at home. She has a hard time describing it and described it as a shooting pain up her entire chest. O2 sats were 89% in the ER on room air.   Past Medical History  Diagnosis Date  . Ischemic  cardiomyopathy     a. s/p MDT dual chamber ICD implanted 2013 by Dr Westley Gambles at [facility], now followed by Dr Rayann Heman. b. LVEF 30-35% by echo 01/2015 at [facility] per report; 35-40% by echo 01/2015 at [facility].  Marland Kitchen History of stroke      a. 1993  . Coronary artery disease     a. s/p previous interventions in [place] per patient, no records available. b. cath 04/2013 with non-obstructive disease. c. STEMI 01/2015 @ [facility] - cardiac cath had shown 100% stenosis of prox PDA, patent stent in RCA with otherwise normal coronary arteries.  . Lymphoma ([place])     a. s/p chemo/radiation 2009  . Chronic systolic CHF (congestive heart failure) ([place])   . HTN (hypertension)   . HLD (hyperlipidemia)   . Ventricular tachycardia ([place])     a. CL 300 msec requiring ICD shocks therpay 5/14 b. recurrent VT 04/2014, placed on amiodarone c. recurrent VT 05/2014 s/p ablation by Dr Rayann Heman d. s/p repeat ablation 07/2014  . DM2 (diabetes mellitus, type 2), newly diagnosed    . CKD (chronic kidney disease) stage 3, GFR 30-59 ml/min    . Polymyalgia rheumatica ([place])   . Depression   . GERD (gastroesophageal reflux disease)   . Renal atrophy, left     a. [place] 02/2015: "Left renal atrophy, likely from chronic renal arterial stenosis"  . Mitral regurgitation     a. mod by echo 01/2015.  . Diverticulosis     a. By CT 02/2015.  Marland Kitchen PAF (paroxysmal atrial fibrillation) ([place])  a. Brief paroxysms during admission 02/2015 - amiodarone increased. Not placed on anticoag due to dual antiplatelet therapy and bleeding risk including in the setting of possible GI pathology.   . Hiatal hernia     a. by EGD 06/2014.  Marland Kitchen Gastritis     a. by EGD 06/2014.    Past Surgical History  Procedure Laterality Date  . Cardiac defibrillator placement  03/2011    MDT ICD implanted at [facility] by Dr Tana Coast  . Coronary angioplasty with stent placement    . Left and right heart catheterization with coronary angiogram N/A 05/03/2013    non-obstructive CAD  . V-tach  ablation N/A 05/26/2014    VT ablation by Dr Rayann Heman - PVCs arising from the inferolateral LV, extensive substrate ablation  . Electrophysiologic study N/A 08/04/2014    Procedure: V Tach Ablation;  Surgeon: Thompson Grayer, MD;  Location: [place] CV LAB;  Service: Cardiovascular;  Laterality: N/A;    Family History  Problem Relation Age of Onset  . Diabetes Father   . Hypertension Father   . Heart disease Father   . Alcoholism Father   . Hypertension Mother   . Heart disease Mother   . Breast cancer Mother   . Stroke Mother   . Diabetes Brother   . Diabetes Brother   . Diabetes Brother   . Diabetes Brother   . Hypertension Sister   . Heart disease Sister   . Alcoholism Sister   . Thyroid disease Sister   . Heart attack Sister   . Heart attack Brother   . Stroke Brother     Social History:  reports that she quit smoking about 9 months ago. She has never used smokeless tobacco. She reports that she does not drink alcohol or use illicit drugs.  Allergies:  Allergies  Allergen Reactions  . Flagyl [Metronidazole] Swelling and Rash    Face swells   . Contrast Media [Iodinated Diagnostic Agents] Rash  . Ioxaglate Rash  . Potassium Sulfate Rash and Other (See Comments)    Headaches  . Potassium-Containing Compounds Other (See Comments)    Headaches-- reports no problems now    Medications:  Scheduled Meds: . amiodarone  200 mg Oral BID  . aspirin EC  81 mg Oral Daily  . atorvastatin  40 mg Oral q1800  . carvedilol  6.25 mg Oral BID WC  . clopidogrel  75 mg Oral QHS  . [START ON 03/02/2015] glipiZIDE  2.5 mg Oral Q breakfast  . hydrALAZINE  10 mg Oral 3 times per day  . isosorbide mononitrate  30 mg Oral Daily  . pantoprazole  40 mg Oral BID  . ranolazine  500 mg Oral BID  . sucralfate  1 g Oral TID WC & HS   Continuous Infusions:  PRN Meds:.nitroGLYCERIN, traMADol   Results for orders placed or performed during the hospital encounter of 03/01/15 (from the past 48  hour(s))  Basic metabolic panel     Status: Abnormal   Collection Time: 03/01/15  8:38 AM  Result Value Ref Range   Sodium 134 (L) 135 - 145 mmol/L   Potassium 4.0 3.5 - 5.1 mmol/L   Chloride 97 (L) 101 - 111 mmol/L   CO2 25 22 - 32 mmol/L   Glucose, Bld 102 (H) 65 - 99 mg/dL   BUN 28 (H) 6 - 20 mg/dL   Creatinine, Ser 2.53 (H) 0.44 - 1.00 mg/dL   Calcium 8.9 8.9 - 10.3 mg/dL   GFR  calc non Af Amer 19 (L) >60 mL/min   GFR calc Af Amer 22 (L) >60 mL/min    Comment: (NOTE) The eGFR has been calculated using the CKD EPI equation. This calculation has not been validated in all clinical situations. eGFR's persistently <60 mL/min signify possible Chronic Kidney Disease.    Anion gap 12 5 - 15  CBC with Differential     Status: Abnormal   Collection Time: 03/01/15  8:38 AM  Result Value Ref Range   WBC 9.8 4.0 - 10.5 K/uL   RBC 3.63 (L) 3.87 - 5.11 MIL/uL   Hemoglobin 11.6 (L) 12.0 - 15.0 g/dL   HCT 33.6 (L) 36.0 - 46.0 %   MCV 92.6 78.0 - 100.0 fL   MCH 32.0 26.0 - 34.0 pg   MCHC 34.5 30.0 - 36.0 g/dL   RDW 16.0 (H) 11.5 - 15.5 %   Platelets 293 150 - 400 K/uL   Neutrophils Relative % 79 %   Neutro Abs 7.7 1.7 - 7.7 K/uL   Lymphocytes Relative 11 %   Lymphs Abs 1.0 0.7 - 4.0 K/uL   Monocytes Relative 9 %   Monocytes Absolute 0.9 0.1 - 1.0 K/uL   Eosinophils Relative 1 %   Eosinophils Absolute 0.1 0.0 - 0.7 K/uL   Basophils Relative 0 %   Basophils Absolute 0.0 0.0 - 0.1 K/uL  Troponin I     Status: Abnormal   Collection Time: 03/01/15  8:38 AM  Result Value Ref Range   Troponin I 0.22 (H) <0.031 ng/mL    Comment:        PERSISTENTLY INCREASED TROPONIN VALUES IN THE RANGE OF 0.04-0.49 ng/mL CAN BE SEEN IN:       -UNSTABLE ANGINA       -CONGESTIVE HEART FAILURE       -MYOCARDITIS       -CHEST TRAUMA       -ARRYHTHMIAS       -LATE PRESENTING MYOCARDIAL INFARCTION       -COPD   CLINICAL FOLLOW-UP RECOMMENDED.   Brain natriuretic peptide     Status: Abnormal    Collection Time: 03/01/15  8:39 AM  Result Value Ref Range   B Natriuretic Peptide 452.0 (H) 0.0 - 100.0 pg/mL    Dg Chest Portable 1 View  03/01/2015  CLINICAL DATA:  64 year old female with shortness of breath and chest pain radiating from the sternum to the left side. Myocardial infarction 2 weeks ago. Former smoker. Initial encounter. EXAM: PORTABLE CHEST 1 VIEW COMPARISON:  02/22/2015 and earlier. FINDINGS: Portable AP upright view at 0846 hours. Increased basilar predominant interstitial opacity since the prior. No pneumothorax or pleural effusion. Mildly lower lung volumes. No consolidation. Stable cardiomegaly and mediastinal contours. Stable left chest cardiac AICD. Visualized tracheal air column is within normal limits. IMPRESSION: Increased basilar predominant interstitial opacity, favor acute interstitial edema. No other acute cardiopulmonary abnormality. Electronically Signed   By: Genevie [name] M.D.   On: 03/01/2015 09:10   2D Echo 02/22/15 - - Left ventricle: Wall thickness was increased in a pattern of  moderate LVH. Systolic function was moderately reduced. The estimated ejection fraction was in the range of 35% to 40%. - Mitral valve: There was moderate regurgitation. - Left atrium: The atrium was severely dilated. - Pulmonary arteries: Systolic pressure was mildly increased. PA peak pressure: 31 mm Hg (S). _____________  ROS  See HPI Eyes: Negative Ears:Negative for hearing loss, tinnitus Cardiovascular: Positive for chest pain, palpitations,irregular heartbeat, dyspnea,  dyspnea on exertion, near-syncope, orthopnea, paroxysmal nocturnal dyspnea and syncope,edema, claudication, cyanosis,.  Respiratory:   Positive for cough, hemoptysis, , sleep disturbances due to breathing, sputum production and wheezing.   Endocrine: Negative for cold intolerance and heat intolerance.  Hematologic/Lymphatic: Negative for adenopathy and bleeding problem. Does not bruise/bleed easily.   Musculoskeletal: Negative.   Gastrointestinal: Negative for nausea, vomiting, reflux,  diarrhea, constipation.   Genitourinary: Negative for bladder incontinence, dysuria, flank pain, frequency, hematuria, hesitancy, nocturia and urgency.  Neurological: Negative.  Allergic/Immunologic: Negative for environmental allergies.  Blood pressure 110/93, pulse 99, temperature 97.8 F (36.6 C), temperature source Oral, resp. rate 16, height _0  (1.651 m), weight 200 lb (90.719 kg), SpO2 94 %. Physical Exam PHYSICAL EXAM: Well-nournished, in no acute distress. Neck: No JVD, HJR, Bruit, or thyroid enlargement Lungs: Decreased breath sounds with rales at the bases Cardiovascular: Irregular at 130 bpm PMI not displaced, heart sounds normal, no murmurs, gallops, bruit, thrill, or heave. Abdomen: BS normal. Soft without organomegaly, masses, lesions or tenderness. Extremities: without cyanosis, clubbing or edema. Good distal pulses bilateral SKin: Warm, no lesions or rashes  Musculoskeletal: No deformities Neuro: no focal signs  2D Echo 02/22/15 - - Left ventricle: Wall thickness was increased in a pattern of  moderate LVH. Systolic function was moderately reduced. The estimated ejection fraction was in the range of 35% to 40%. - Mitral valve: There was moderate regurgitation. - Left atrium: The atrium was severely dilated. - Pulmonary arteries: Systolic pressure was mildly increased. PA peak pressure: 31 mm Hg (S). _____________   Assessment/Plan: Acute on chronic systolic heart failure EF 35-40%. Patient just discharged yesterday after hospitalization for heart failure. Will need more IV diuresis.   CAD status post recent STEMI 01/2015 with total proximal PDA treated medically at [facility] films reviewed in [employer] and they concur to treat medically, troponins trending down, Ranexa started. Prior RCA PCI   History of ventricular tachycardia 04/2014 status post repeat VT ablation  07/2004 continue amiodarone status post ICD 2013 at [facility]  Paroxysmal atrial fibrillation with RVR: Patient hasn't received her amiodarone or Coreg yet today. Amiodarone increased to 200 mg twice a day.not placed on anticoagulation  given dual antiplatelet therapy and bleeding risk with all her GI symptoms.  CKDor ARB  Hypertension  Abdominal pain with GI workup at [facility] question recurrent gastritis  Hyperlipidemia  Ermalinda Barrios 03/01/2015, 1:53 PM   The patient was seen and examined, and I agree with the assessment and plan as documented above, with modifications as noted below.  Pt with aforementioned history admitted with recurrent shortness of breath and found to be in acute on chronic systolic heart failure. She was given one dose of IV Lasix and feels better. Her lungs are now clear.   When I examined her she was in a regular rhythm but had been in rapid atrial fibrillation when examined by Gerrianne Scale PA-C, which may have precipitated her current decompensation. Had palpitations last night and earlier this morning. She still has yet to receive Coreg or amiodarone. I have instructed the nurse on the 3rd floor to do so.  CAD is being treated medically as noted above. She continues to complain of 3/10 retrosternal chest pain and left inframammary pain with eating. Carafate recently added.  Recommend monitoring overnight to see if she needs any additional IV diuresis.  Otherwise, would continue outpatient medical regimen as per discharge summary yesterday.  She can follow up with Dr. Johnsie Cancel and Dr. Rayann Heman.  Jamesetta So  Bronson Ing, MD, [facility]  03/01/2015 3:43 PM

## 2015-03-02 ENCOUNTER — Encounter (HOSPITAL_COMMUNITY): Payer: Self-pay | Admitting: Internal Medicine

## 2015-03-02 DIAGNOSIS — I5023 Acute on chronic systolic (congestive) heart failure: Secondary | ICD-10-CM | POA: Diagnosis not present

## 2015-03-02 DIAGNOSIS — J9601 Acute respiratory failure with hypoxia: Secondary | ICD-10-CM | POA: Diagnosis not present

## 2015-03-02 DIAGNOSIS — F419 Anxiety disorder, unspecified: Secondary | ICD-10-CM

## 2015-03-02 DIAGNOSIS — I255 Ischemic cardiomyopathy: Secondary | ICD-10-CM | POA: Diagnosis not present

## 2015-03-02 DIAGNOSIS — I480 Paroxysmal atrial fibrillation: Secondary | ICD-10-CM

## 2015-03-02 HISTORY — DX: Anxiety disorder, unspecified: F41.9

## 2015-03-02 LAB — CBC
HCT: 33.9 % — ABNORMAL LOW (ref 36.0–46.0)
Hemoglobin: 11.4 g/dL — ABNORMAL LOW (ref 12.0–15.0)
MCH: 31.3 pg (ref 26.0–34.0)
MCHC: 33.6 g/dL (ref 30.0–36.0)
MCV: 93.1 fL (ref 78.0–100.0)
PLATELETS: 281 10*3/uL (ref 150–400)
RBC: 3.64 MIL/uL — AB (ref 3.87–5.11)
RDW: 16.4 % — AB (ref 11.5–15.5)
WBC: 8.8 10*3/uL (ref 4.0–10.5)

## 2015-03-02 LAB — TROPONIN I: TROPONIN I: 0.19 ng/mL — AB (ref <0.031)

## 2015-03-02 LAB — BASIC METABOLIC PANEL
ANION GAP: 7 (ref 5–15)
BUN: 30 mg/dL — ABNORMAL HIGH (ref 6–20)
CHLORIDE: 98 mmol/L — AB (ref 101–111)
CO2: 29 mmol/L (ref 22–32)
Calcium: 8.7 mg/dL — ABNORMAL LOW (ref 8.9–10.3)
Creatinine, Ser: 2.65 mg/dL — ABNORMAL HIGH (ref 0.44–1.00)
GFR calc non Af Amer: 18 mL/min — ABNORMAL LOW (ref >60)
GFR, EST AFRICAN AMERICAN: 21 mL/min — AB (ref >60)
GLUCOSE: 114 mg/dL — AB (ref 65–99)
POTASSIUM: 3.7 mmol/L (ref 3.5–5.1)
Sodium: 134 mmol/L — ABNORMAL LOW (ref 135–145)

## 2015-03-02 LAB — GLUCOSE, CAPILLARY
GLUCOSE-CAPILLARY: 120 mg/dL — AB (ref 65–99)
Glucose-Capillary: 104 mg/dL — ABNORMAL HIGH (ref 65–99)

## 2015-03-02 MED ORDER — FUROSEMIDE 40 MG PO TABS
40.0000 mg | ORAL_TABLET | Freq: Every day | ORAL | Status: DC
  Administered 2015-03-02: 40 mg via ORAL
  Filled 2015-03-02: qty 1

## 2015-03-02 MED ORDER — GLIPIZIDE ER 2.5 MG PO TB24: 2.5000 mg | ORAL_TABLET | Freq: Every day | ORAL | Status: DC

## 2015-03-02 MED ORDER — ALPRAZOLAM 0.25 MG PO TABS
0.2500 mg | ORAL_TABLET | Freq: Three times a day (TID) | ORAL | Status: DC | PRN
  Administered 2015-03-02: 0.25 mg via ORAL
  Filled 2015-03-02: qty 1

## 2015-03-02 MED ORDER — ALPRAZOLAM 0.25 MG PO TABS: 0.2500 mg | ORAL_TABLET | Freq: Three times a day (TID) | ORAL | Status: DC | PRN

## 2015-03-02 NOTE — Care Management Note (Signed)
Case Management Note  Patient Details  Name: [name] Terry MRN: [id number] Date of Birth: 11/08/1950  Subjective/Objective:                  Pt admitted from home with CHF. Pt lives alone and will return home at discharge. Pt is independent with ADL's.Pt has a sister who lives next door and is able to assist pt as needed.  Action/Plan: No CM needs noted.  Expected Discharge Date:  03/02/15               Expected Discharge Plan:  Home/Self Care  In-House Referral:  NA  Discharge planning Services  CM Consult  Post Acute Care Choice:  NA Choice offered to:  NA  DME Arranged:    DME Agency:     HH Arranged:    HH Agency:     Status of Service:  Completed, signed off  Medicare Important Message Given:    Date Medicare IM Given:    Medicare IM give by:    Date Additional Medicare IM Given:    Additional Medicare Important Message give by:     If discussed at [place] of Stay Meetings, dates discussed:    Additional Comments:  Joylene Draft, RN 03/02/2015, 11:38 AM

## 2015-03-02 NOTE — Care Management Obs Status (Signed)
[place] NOTIFICATION   Patient Details  Name: BONNIE VELADOR MRN: [id number] Date of Birth: Nov 29, 1950   Medicare Observation Status Notification Given:  Yes    Joylene Draft, RN 03/02/2015, 11:35 AM

## 2015-03-02 NOTE — Plan of Care (Signed)
Problem: Food- and Nutrition-Related Knowledge Deficit (NB-1.1) Goal: Nutrition education Formal process to instruct or train a patient/client in a skill or to impart knowledge to help patients/clients voluntarily manage or modify food choices and eating behavior to maintain or improve health. Outcome: Completed/Met Date Met:  03/02/15 Nutrition Education Note  RD consulted for nutrition education regarding new onset CHF.  RD provided "Heart Failure Nutrition Therapy" handout from the Academy of Nutrition and Dietetics. Reviewed patient's dietary recall.  Breakfast: Oatmeal OR Cornflakes. Sometimes eggs, bacon, sausage Lunch: Chicken Fleeta Emmer Dinner: Fried Chicken wings blacked eyed peas Other habits: Admits to eating a lot of [place] hotdogs. Eats canned soup weekly. Gets Frozen meals. Eats out 1x weekly.    Provided examples on ways to decrease sodium intake in diet. Her biggest obstacle and number one goal is to reduce the [place] hotdogs. Pt was under the impressiont that because they were [place] they were OK. Patient was educated that "[place]" does not equate to "Healthy" and many times these products have the same amount, if not more, sodium. Discouraged intake of processed foods such as the frozen meals and canned soups. Talked about how frozen meals and canned items are often the highest sources of sodium.  Encouraged fresh or frozen meats, fruits, vegetables.   Advised against eating out. She states she gets food from a [employer], but asks them to prepare her meals without salt. This is difficult to believe as [place] food has some of the highest sodium content. Unlikely all of salt could just be removed.  RD discussed  importance of weighing self daily to judge dietary compliance. Briefly educated on how to read labels and how to adjust for amount of servings eaten. Gave pt goal of <2000 mg/sodium a day.  Expect poor-fair compliance. She was about to be discharged and didn't  seem too interested in what I was saying at all.   Body mass index is 33.3 kg/(m^2). Pt meets criteria for obese based on current BMI.  Current diet order is HH/Carb mod. There is no documented intake of meals at this time. Labs and medications reviewed. No further nutrition interventions warranted at this time. RD contact information provided. If additional nutrition issues arise, please re-consult RD.   Burtis Junes RD, LDN Nutrition Pager: [phone] 03/02/2015 3:55 PM

## 2015-03-02 NOTE — Progress Notes (Signed)
Discharge instructions given to Patient with needed F/U appts with PCP and Cardiology.  Patient states she wishes to make the appointments for herself.  Prescription for Xanax Prn given to Patient.  All questions and concerns addressed with Patient.  Discharged in stable condition via w/c to private vehicle accompanied by sister.

## 2015-03-02 NOTE — Progress Notes (Signed)
Patient in room c/o shortness of breath.  Upon entering room, patient is sitting on bed in high fowlers position.  Bed was returned to [facility] position and Patient was noted to be breathing rapidly.  I suggested she slow her breathing and she did.  I monitored her O2 Saturation immediately, and at rest, she was saturating 98-100%.  After several minutes of speaking with patient, she stated she felt very anxious.  She has a history of having Xanax prescribed by her PCP.  Dr. Caryn Section was notified and ordered Xanax 0.25-0.5 mg PRN.  Xanax 0.25mg  was administered with effective relief.  Patient noted to be lying on bed in semi fowlers position with eyes closed.  Respirations 18.  Will continue to monitor.

## 2015-03-02 NOTE — Discharge Summary (Signed)
Physician Discharge Summary  [name] Terry [id number] DOB: Oct 07, 1950 DOA: 03/01/2015  PCP: Bronson Curb, PA-C  Admit date: 03/01/2015 Discharge date: 03/02/2015  Time spent: 30 minutes minutes  Recommendations for Outpatient Follow-up:  1. Recommend follow-up of the patient's renal function as her creatinine was trending up.   Discharge Diagnoses:  1. Acute on chronic dyspnea, possibly secondary to anxiousness or panic disorder. 2. Mild acute on chronic systolic congestive heart failure. 3. Transient hypoxia/acute respiratory failure with hypoxia with oxygen saturations improving on room air at the time of discharge. 4. CAD with recent ST elevation MI in [facility] 01/2015: Cardiac catheterization revealed 100% stenosis of the proximal PDA, patent stent in the RCA with otherwise normal coronary arteries. Medical therapy was recommended. 5. Ischemic cardiomyopathy/chronic systolic congestive heart failure with the most recent EF of 35-40%. 6. Status post dual-chamber ICD in 2013. 7. Paroxysmal atrial fibrillation. 8. Ventricular tachycardia, status post ablation 2. 9. Lymphoma, status post chemotherapy/radiation therapy in 2009. 10. Stage III chronic kidney disease. 11. Chronic GERD and dyspepsia. 12. Diabetes mellitus with nephropathy.  Discharge Condition: Improved.  Diet recommendation: Heart healthy/carbohydrate modified.  Filed Weights   03/01/15 0818 03/02/15 0500  Weight: 90.719 kg (200 lb) 90.765 kg (200 lb 1.6 oz)    History of present illness:  [name] Terry is a 64 y.o. female with a history of chronic systolic congestive heart failure, ischemic cardiomyopathy status post ICD placement in 2013, CAD with a history of a recent STEMI in November 2016-treated medically at [facility] with a cardiac catheterization showing 100% stenosis of the proximal PDA, patent stent in RCA, and otherwise normal coronary arteries; GERD, CKD, and diabetes mellitus with  nephropathy. The patient was also just discharged from [facility] on 02/28/15 after an 8 day hospitalization for treatment of acute on chronic systolic congestive heart failure and chest pain. She was discharged on excellent medical therapy. She returned to the ED with a complaint of progressive shortness of breath. She did not endorse outright orthopnea or PND, but mostly dyspnea on exertion. She denied any increase in swelling of her legs or her abdomen. She had some central chest pain at home which was relieved with one sublingual nitroglycerin. When questioned about her salt intake, she did not readily admit adding salt to her foods, but her daughter stated that she ate a processed chicken pot pie the night before.  In the ED, the patient was afebrile and hemodynamically stable. With short ambulation on room air, her oxygen saturations fell to 87% and improved to 94% on 2 L of nasal cannula oxygen. Her lab data were significant for a troponin I of 0.22 compared to 0.63 on 02/26/15; BNP of 452 compared to 464 on 02/21/15; creatinine of 2.53, hemoglobin of 11.6, and glucose of 102. Her chest x-ray revealed increased basilar predominant interstitial opacity favoring interstitial edema without any other acute cardiopulmonary abnormality. EKG revealed sinus rhythm, heart rate 91 bpm, nonspecific T-wave abnormalities. She was admitted for further evaluation and management.  Hospital Course:  The patient was given 40 mg of IV Lasix in the ED. She was restarted on all of her other chronic medications. Lasix was transitioned the following day to by mouth. Oxygen was applied and titrated to keep her oxygen saturation is greater than 90%. Glipizide was withheld and her diabetes was treated with sliding scale NovoLog. She was continued on her PPI, H2 blocker, and Carafate for chronic GERD and dyspepsia. Her cardiac enzymes were cycled and cardiology was  consulted. Dr. Bronson Ing agreed with medical management. At the time of  this assessment, the patient had already been given Lasix and had diuresed some. Her lungs were clear per his exam.  The patient's cardiac enzyme trended downward and was 0.19 at the time of discharge. Her oxygen saturations improved and she was oxygenating in the low to mid 90s on room air with ambulation and at rest. She expressed quite a bit of anxiousness when she was told that she did not qualify for home oxygen. She stated that she has a history of anxiety. Her sister, who was in the room, stated that she herself suffered from panic attacks. Therefore, Xanax was prescribed for the patient. She received 1 dose and felt much better. Therefore, a prescription was given for Xanax 0.25 mg 1-2 tablets 3 times a day as needed. She was instructed to follow-up with her primary care provider for chronic treatment. Patient was also instructed to follow a low-salt/low sodium diet.  The patient remained hemodynamically stable. Her creatinine did trend upward to 2.65, but appears to be in the range of stage III chronic kidney disease. Would recommend rechecking her renal function at her hospital follow-up appointment.  Procedures:  None  Consultations:  Cardiology  Discharge Exam: Filed Vitals:   03/01/15 2237 03/02/15 0500  BP: 131/79 126/78  Pulse:  84  Temp: 98.3 F (36.8 C) 98.6 F (37 C)  Resp: 20 22   oxygen saturation 93% on room air  General: 64 year old African-American woman in no acute distress. Cardiovascular: S1, S2, with occasional ectopy and soft systolic murmur. Respiratory: Clear to auscultation bilaterally. Abdomen: Positive bowel sounds, soft, nontender, nondistended. Extremities: No pedal edema. Psychiatric: No signs of anxiousness, status post stent.  Discharge Instructions   Discharge Instructions    Diet - low sodium heart healthy    Complete by:  As directed      Diet Carb Modified    Complete by:  As directed      Discharge instructions    Complete by:  As  directed   [place] AND GASTROENTEROLOGIST FOR YOUR CHRONIC CONDITIONS.     Increase activity slowly    Complete by:  As directed           Current Discharge Medication List    START taking these medications   Details  ALPRAZolam (XANAX) 0.25 MG tablet Take 1-2 tablets (0.25-0.5 mg total) by mouth 3 (three) times daily as needed for anxiety. Qty: 30 tablet, Refills: 0      CONTINUE these medications which have CHANGED   Details  glipiZIDE (GLUCOTROL XL) 2.5 MG 24 hr tablet Take 1 tablet (2.5 mg total) by mouth daily with breakfast. DO NOT TAKE THIS MEDICATION FOR YOUR DIABETES IF YOUR BLOOD SUGARS BELOW 120 AND/OR IF YOU'RE NOT EATING WELL. CHECK YOUR BLOOD SUGARS TWICE DAILY.      CONTINUE these medications which have NOT CHANGED   Details  amiodarone (PACERONE) 200 MG tablet Take 1 tablet (200 mg total) by mouth 2 (two) times daily. Qty: 60 tablet, Refills: 1    aspirin EC 81 MG tablet Take 81 mg by mouth daily.    atorvastatin (LIPITOR) 40 MG tablet Take 1 tablet (40 mg total) by mouth daily at 6 PM. Qty: 90 tablet, Refills: 30   Associated Diagnoses: Cardiomyopathy, ischemic    carvedilol (COREG) 6.25 MG tablet Take 6.25 mg by mouth 2 (two) times daily with a meal.    clopidogrel (PLAVIX)  75 MG tablet Take 75 mg by mouth at bedtime.     furosemide (LASIX) 40 MG tablet Take 1 tablet (40 mg total) by mouth daily. Qty: 90 tablet, Refills: 3    hydrALAZINE (APRESOLINE) 10 MG tablet Take 1 tablet (10 mg total) by mouth every 8 (eight) hours. Qty: 90 tablet, Refills: 3    isosorbide mononitrate (IMDUR) 30 MG 24 hr tablet Take 1 tablet (30 mg total) by mouth daily. Qty: 30 tablet, Refills: 3    Multiple Vitamin (MULTI VITAMIN DAILY PO) Take 1 tablet by mouth daily.    pantoprazole (PROTONIX) 40 MG tablet Take 1 tablet (40 mg total) by mouth 2 (two) times daily. Qty: 60 tablet, Refills: 1    ranolazine (RANEXA) 500 MG 12 hr tablet Take 1  tablet (500 mg total) by mouth 2 (two) times daily. Qty: 60 tablet, Refills: 3    sucralfate (CARAFATE) 1 GM/10ML suspension Take 10 mLs (1 g total) by mouth 4 (four) times daily -  with meals and at bedtime. Qty: 420 mL, Refills: 0    traMADol (ULTRAM) 50 MG tablet Take 1 tablet (50 mg total) by mouth every 12 (twelve) hours as needed for moderate pain or severe pain. Qty: 6 tablet, Refills: 0    nitroGLYCERIN (NITROSTAT) 0.4 MG SL tablet Place 1 tablet (0.4 mg total) under the tongue every 5 (five) minutes as needed for chest pain. Qty: 25 tablet, Refills: 3       Allergies  Allergen Reactions  . Flagyl [Metronidazole] Swelling and Rash    Face swells   . Contrast Media [Iodinated Diagnostic Agents] Rash  . Ioxaglate Rash  . Potassium Sulfate Rash and Other (See Comments)    Headaches  . Potassium-Containing Compounds Other (See Comments)    Headaches-- reports no problems now   Follow-up Information    Follow up with Jenkins Rouge, MD. Schedule an appointment as soon as possible for a visit in 1 week.   Specialty:  Cardiology   Why:  YOUR CARDIOLOGIST.   Contact information:   [id number] [place] [address] [phone]       Schedule an appointment as soon as possible for a visit with ROBERTSON, ANTHONY T, PA-C.   Specialty:  Physician Assistant   Contact information:   [address] [place]  [address] [phone]        The results of significant diagnostics from this hospitalization (including imaging, microbiology, ancillary and laboratory) are listed below for reference.    Significant Diagnostic Studies: Ct Abdomen Pelvis Wo Contrast  02/26/2015  CLINICAL DATA:  Chronic mid epigastric abdominal pain, worse after eating. Initial encounter. EXAM: CT ABDOMEN AND PELVIS WITHOUT CONTRAST TECHNIQUE: Multidetector CT imaging of the abdomen and pelvis was performed following the standard protocol without IV contrast. COMPARISON:  CT of  the abdomen and pelvis performed 06/15/2014, and abdominal ultrasound performed 02/25/2015 FINDINGS: Mild bibasilar scarring is noted at the lung bases. Pacemaker leads are partially imaged. Diffuse coronary artery calcifications are seen. Trace pericardial fluid remains within normal limits. The liver and spleen are unremarkable in appearance. The gallbladder is within normal limits. The pancreas and adrenal glands are unremarkable. Scattered bilateral renal cysts are seen, measuring up to 5.4 cm in size. The kidneys are otherwise unremarkable. There is no evidence of hydronephrosis. No renal or ureteral stones are seen. No perinephric stranding is appreciated. No free fluid is identified. The small bowel is unremarkable in appearance. The stomach is  within normal limits. No acute vascular abnormalities are seen. Relatively diffuse calcification is seen along the abdominal aorta and its branches. Vascular stents are noted at the proximal renal arteries bilaterally. The appendix is normal in caliber, without evidence of appendicitis. Scattered diverticulosis is noted along the descending and proximal sigmoid colon, without evidence of diverticulitis. The bladder is mildly distended and grossly unremarkable. The uterus is unremarkable in appearance. The ovaries are grossly symmetric. No suspicious adnexal masses are seen. No inguinal lymphadenopathy is seen. No acute osseous abnormalities are identified. Facet disease is noted at the lower lumbar spine. IMPRESSION: 1. No acute abnormality seen to explain the patient's symptoms. 2. Scattered bilateral renal cysts seen. 3. Relatively diffuse calcification along the abdominal aorta and its branches. 4. Diffuse coronary artery calcifications seen. 5. Scattered diverticulosis along the descending and proximal sigmoid colon, without evidence of diverticulitis. Electronically Signed   By: Garald Balding M.D.   On: 02/26/2015 19:45   Dg Chest 2 View  02/22/2015  CLINICAL  DATA:  Shortness of breath today, myocardial infarction 10 days ago EXAM: CHEST  2 VIEW COMPARISON:  02/20/2015 FINDINGS: Mild cardiac enlargement stable. Aortic arch calcifications stable. Two lead cardiac pacer unchanged. Vascular pattern within normal limits. No significant consolidation. Possible trace left pleural effusion. IMPRESSION: No significant acute abnormalities. Electronically Signed   By: Skipper Cliche M.D.   On: 02/22/2015 11:53   Dg Chest 2 View  02/20/2015  CLINICAL DATA:  Chest pain for 1 day.  Dyspnea. EXAM: CHEST  2 VIEW COMPARISON:  06/29/2014. FINDINGS: The cardiac silhouette is mildly enlarged and the aorta remains tortuous and partially calcified. The interstitial markings are mildly prominent with mild progression. Normal vascularity. No pleural fluid. Minimal thoracic spine degenerative changes. Stable left subclavian pacer and AICD leads. IMPRESSION: Interval mild cardiomegaly and mildly prominent interstitial markings. The interstitial prominence could be due to viral pneumonitis or minimal interstitial pulmonary edema. Electronically Signed   By: Claudie Revering M.D.   On: 02/20/2015 17:44   US Abdomen Complete  02/25/2015  CLINICAL DATA:  Abdominal pain EXAM: ULTRASOUND ABDOMEN COMPLETE COMPARISON:  06/15/2014 abdominal CT.  Renal ultrasound 01/03/2015 FINDINGS: Gallbladder: No gallstones or wall thickening visualized. No sonographic Murphy sign noted. Common bile duct: Diameter: 2 mm Liver: No focal lesion identified. Within normal limits in parenchymal echogenicity. Antegrade flow in the imaged portal venous system. IVC: No abnormality visualized. Pancreas: Visualized portion unremarkable. Spleen: Size and appearance within normal limits. Right Kidney: Length: 10.4 cm. Echogenicity within normal limits. No mass or hydronephrosis visualized. Two renal cysts up to 6 cm Left Kidney: Length: 9 cm with smooth cortex. No hydronephrosis or solid mass. 2 renal cysts up to 18 mm  Abdominal aorta: Extensive atherosclerosis.  No aneurysm detected. Other findings: None. IMPRESSION: 1. No explanation for acute abdominal pain. 2. Left renal atrophy, likely from chronic renal arterial stenosis given appearance and left renal artery stenting. Electronically Signed   By: Monte Fantasia M.D.   On: 02/25/2015 14:55   Nm Pulmonary Perf And Vent  02/23/2015  CLINICAL DATA:  Short of breath following cardiac catheterization. Chest pain. EXAM: NUCLEAR MEDICINE VENTILATION - PERFUSION LUNG SCAN TECHNIQUE: Ventilation images were obtained in multiple projections using inhaled aerosol Tc-18m DTPA. Perfusion images were obtained in multiple projections after intravenous injection of Tc-45m MAA. RADIOPHARMACEUTICALS:  31.3 Technetium-16m DTPA aerosol inhalation and 4.1 Technetium-66m MAA IV COMPARISON:  Radiograph 02/22/2015 FINDINGS: Ventilation: No focal ventilation defect. Perfusion: No wedge shaped peripheral perfusion defects to  suggest acute pulmonary embolism. IMPRESSION: No evidence of pulmonary embolism. Electronically Signed   By: Suzy Bouchard M.D.   On: 02/23/2015 14:28   Dg Chest Portable 1 View  03/01/2015  CLINICAL DATA:  65 year old female with shortness of breath and chest pain radiating from the sternum to the left side. Myocardial infarction 2 weeks ago. Former smoker. Initial encounter. EXAM: PORTABLE CHEST 1 VIEW COMPARISON:  02/22/2015 and earlier. FINDINGS: Portable AP upright view at 0846 hours. Increased basilar predominant interstitial opacity since the prior. No pneumothorax or pleural effusion. Mildly lower lung volumes. No consolidation. Stable cardiomegaly and mediastinal contours. Stable left chest cardiac AICD. Visualized tracheal air column is within normal limits. IMPRESSION: Increased basilar predominant interstitial opacity, favor acute interstitial edema. No other acute cardiopulmonary abnormality. Electronically Signed   By: Genevie [name] M.D.   On: 03/01/2015 09:10     Microbiology: No results found for this or any previous visit (from the past 240 hour(s)).   Labs: Basic Metabolic Panel:  Recent Labs Lab 02/26/15 1402 02/27/15 0444 02/28/15 0436 03/01/15 0838 03/02/15 0524  NA 134* 136 132* 134* 134*  K 4.1 3.8 4.2 4.0 3.7  CL 98* 100* 99* 97* 98*  CO2 27 28 27 25 29   GLUCOSE 162* 78 110* 102* 114*  BUN 21* 19 21* 28* 30*  CREATININE 2.19* 2.06* 2.31* 2.53* 2.65*  CALCIUM 8.7* 8.8* 8.7* 8.9 8.7*  MG 2.4 2.6*  --   --   --    Liver Function Tests:  Recent Labs Lab 02/25/15 0914 02/26/15 1520  AST 44* 56*  ALT 33 55*  ALKPHOS 52 61  BILITOT 0.6  --   PROT 6.7  --   ALBUMIN 2.9*  --     Recent Labs Lab 02/25/15 0914 02/26/15 1520  LIPASE  --  38  AMYLASE 102* 95   No results for input(s): AMMONIA in the last 168 hours. CBC:  Recent Labs Lab 02/26/15 1402 03/01/15 0838 03/02/15 0524  WBC 14.8* 9.8 8.8  NEUTROABS  --  7.7  --   HGB 10.7* 11.6* 11.4*  HCT 32.3* 33.6* 33.9*  MCV 93.1 92.6 93.1  PLT 278 293 281   Cardiac Enzymes:  Recent Labs Lab 02/25/15 2127 03/01/15 0838 03/01/15 1643 03/01/15 2211 03/02/15 0524  TROPONINI 0.63* 0.22* 0.16* 0.19* 0.19*   BNP: BNP (last 3 results)  Recent Labs  02/20/15 1645 02/21/15 1042 03/01/15 0839  BNP 463.9* 386.1* 452.0*    ProBNP (last 3 results) No results for input(s): PROBNP in the last 8760 hours.  CBG:  Recent Labs Lab 02/28/15 1112 03/01/15 1653 03/01/15 2108 03/02/15 0807 03/02/15 1146  GLUCAP 141* 125* 130* 104* 120*       Signed:  Tishawna Larouche  [facility] 03/02/2015, 1:38 PM

## 2015-03-02 NOTE — Progress Notes (Signed)
Patient ambulated approximately 75 feet on room air.  O2 Sat 90-96%, heart rate remained wnl.  Dr. Caryn Section aware.

## 2015-03-02 NOTE — Telephone Encounter (Signed)
Pt currently admitted to [facility]

## 2015-03-03 ENCOUNTER — Telehealth: Payer: Self-pay | Admitting: *Deleted

## 2015-03-03 ENCOUNTER — Other Ambulatory Visit: Payer: Medicare Other

## 2015-03-03 ENCOUNTER — Encounter (HOSPITAL_COMMUNITY): Payer: Self-pay

## 2015-03-03 ENCOUNTER — Emergency Department (HOSPITAL_COMMUNITY): Payer: Medicare Other

## 2015-03-03 ENCOUNTER — Inpatient Hospital Stay (HOSPITAL_COMMUNITY)
Admission: EM | Admit: 2015-03-03 | Discharge: 2015-03-12 | DRG: 280 | Disposition: A | Discharge status: Home Health | Payer: Medicare Other | Attending: Internal Medicine | Admitting: Internal Medicine

## 2015-03-03 DIAGNOSIS — D649 Anemia, unspecified: Secondary | ICD-10-CM | POA: Diagnosis present

## 2015-03-03 DIAGNOSIS — Z87891 Personal history of nicotine dependence: Secondary | ICD-10-CM

## 2015-03-03 DIAGNOSIS — E875 Hyperkalemia: Secondary | ICD-10-CM | POA: Diagnosis not present

## 2015-03-03 DIAGNOSIS — K449 Diaphragmatic hernia without obstruction or gangrene: Secondary | ICD-10-CM | POA: Diagnosis present

## 2015-03-03 DIAGNOSIS — F329 Major depressive disorder, single episode, unspecified: Secondary | ICD-10-CM | POA: Diagnosis present

## 2015-03-03 DIAGNOSIS — I340 Nonrheumatic mitral (valve) insufficiency: Secondary | ICD-10-CM | POA: Diagnosis present

## 2015-03-03 DIAGNOSIS — I255 Ischemic cardiomyopathy: Secondary | ICD-10-CM | POA: Diagnosis present

## 2015-03-03 DIAGNOSIS — E1169 Type 2 diabetes mellitus with other specified complication: Secondary | ICD-10-CM

## 2015-03-03 DIAGNOSIS — K56609 Unspecified intestinal obstruction, unspecified as to partial versus complete obstruction: Secondary | ICD-10-CM

## 2015-03-03 DIAGNOSIS — I480 Paroxysmal atrial fibrillation: Secondary | ICD-10-CM | POA: Diagnosis present

## 2015-03-03 DIAGNOSIS — R112 Nausea with vomiting, unspecified: Secondary | ICD-10-CM | POA: Diagnosis present

## 2015-03-03 DIAGNOSIS — Z823 Family history of stroke: Secondary | ICD-10-CM

## 2015-03-03 DIAGNOSIS — R0603 Acute respiratory distress: Secondary | ICD-10-CM

## 2015-03-03 DIAGNOSIS — Z7982 Long term (current) use of aspirin: Secondary | ICD-10-CM

## 2015-03-03 DIAGNOSIS — Z8249 Family history of ischemic heart disease and other diseases of the circulatory system: Secondary | ICD-10-CM

## 2015-03-03 DIAGNOSIS — Z452 Encounter for adjustment and management of vascular access device: Secondary | ICD-10-CM

## 2015-03-03 DIAGNOSIS — Z9114 Patient's other noncompliance with medication regimen: Secondary | ICD-10-CM

## 2015-03-03 DIAGNOSIS — R1012 Left upper quadrant pain: Secondary | ICD-10-CM | POA: Diagnosis present

## 2015-03-03 DIAGNOSIS — R042 Hemoptysis: Secondary | ICD-10-CM | POA: Diagnosis present

## 2015-03-03 DIAGNOSIS — F411 Generalized anxiety disorder: Secondary | ICD-10-CM | POA: Diagnosis present

## 2015-03-03 DIAGNOSIS — Z888 Allergy status to other drugs, medicaments and biological substances status: Secondary | ICD-10-CM

## 2015-03-03 DIAGNOSIS — E038 Other specified hypothyroidism: Secondary | ICD-10-CM | POA: Diagnosis present

## 2015-03-03 DIAGNOSIS — I130 Hypertensive heart and chronic kidney disease with heart failure and stage 1 through stage 4 chronic kidney disease, or unspecified chronic kidney disease: Principal | ICD-10-CM | POA: Diagnosis present

## 2015-03-03 DIAGNOSIS — E785 Hyperlipidemia, unspecified: Secondary | ICD-10-CM | POA: Diagnosis present

## 2015-03-03 DIAGNOSIS — E02 Subclinical iodine-deficiency hypothyroidism: Secondary | ICD-10-CM | POA: Diagnosis present

## 2015-03-03 DIAGNOSIS — M353 Polymyalgia rheumatica: Secondary | ICD-10-CM | POA: Diagnosis present

## 2015-03-03 DIAGNOSIS — I5043 Acute on chronic combined systolic (congestive) and diastolic (congestive) heart failure: Secondary | ICD-10-CM | POA: Diagnosis present

## 2015-03-03 DIAGNOSIS — E876 Hypokalemia: Secondary | ICD-10-CM | POA: Diagnosis not present

## 2015-03-03 DIAGNOSIS — J209 Acute bronchitis, unspecified: Secondary | ICD-10-CM | POA: Diagnosis present

## 2015-03-03 DIAGNOSIS — I25119 Atherosclerotic heart disease of native coronary artery with unspecified angina pectoris: Secondary | ICD-10-CM | POA: Diagnosis present

## 2015-03-03 DIAGNOSIS — Z833 Family history of diabetes mellitus: Secondary | ICD-10-CM

## 2015-03-03 DIAGNOSIS — E1122 Type 2 diabetes mellitus with diabetic chronic kidney disease: Secondary | ICD-10-CM | POA: Diagnosis present

## 2015-03-03 DIAGNOSIS — R52 Pain, unspecified: Secondary | ICD-10-CM

## 2015-03-03 DIAGNOSIS — Z7902 Long term (current) use of antithrombotics/antiplatelets: Secondary | ICD-10-CM

## 2015-03-03 DIAGNOSIS — J9690 Respiratory failure, unspecified, unspecified whether with hypoxia or hypercapnia: Secondary | ICD-10-CM

## 2015-03-03 DIAGNOSIS — I447 Left bundle-branch block, unspecified: Secondary | ICD-10-CM | POA: Diagnosis present

## 2015-03-03 DIAGNOSIS — K920 Hematemesis: Secondary | ICD-10-CM | POA: Diagnosis present

## 2015-03-03 DIAGNOSIS — K219 Gastro-esophageal reflux disease without esophagitis: Secondary | ICD-10-CM | POA: Diagnosis present

## 2015-03-03 DIAGNOSIS — N184 Chronic kidney disease, stage 4 (severe): Secondary | ICD-10-CM | POA: Diagnosis present

## 2015-03-03 DIAGNOSIS — Z9221 Personal history of antineoplastic chemotherapy: Secondary | ICD-10-CM

## 2015-03-03 DIAGNOSIS — Z8572 Personal history of non-Hodgkin lymphomas: Secondary | ICD-10-CM

## 2015-03-03 DIAGNOSIS — J810 Acute pulmonary edema: Secondary | ICD-10-CM | POA: Diagnosis present

## 2015-03-03 DIAGNOSIS — R7989 Other specified abnormal findings of blood chemistry: Secondary | ICD-10-CM | POA: Diagnosis present

## 2015-03-03 DIAGNOSIS — I4819 Other persistent atrial fibrillation: Secondary | ICD-10-CM | POA: Diagnosis present

## 2015-03-03 DIAGNOSIS — K2100 Gastro-esophageal reflux disease with esophagitis, without bleeding: Secondary | ICD-10-CM | POA: Diagnosis present

## 2015-03-03 DIAGNOSIS — F419 Anxiety disorder, unspecified: Secondary | ICD-10-CM | POA: Diagnosis present

## 2015-03-03 DIAGNOSIS — I2510 Atherosclerotic heart disease of native coronary artery without angina pectoris: Secondary | ICD-10-CM

## 2015-03-03 DIAGNOSIS — E669 Obesity, unspecified: Secondary | ICD-10-CM | POA: Diagnosis present

## 2015-03-03 DIAGNOSIS — J9621 Acute and chronic respiratory failure with hypoxia: Secondary | ICD-10-CM | POA: Diagnosis present

## 2015-03-03 DIAGNOSIS — Z8673 Personal history of transient ischemic attack (TIA), and cerebral infarction without residual deficits: Secondary | ICD-10-CM

## 2015-03-03 DIAGNOSIS — Z955 Presence of coronary angioplasty implant and graft: Secondary | ICD-10-CM

## 2015-03-03 DIAGNOSIS — Z882 Allergy status to sulfonamides status: Secondary | ICD-10-CM

## 2015-03-03 DIAGNOSIS — I208 Other forms of angina pectoris: Secondary | ICD-10-CM | POA: Diagnosis present

## 2015-03-03 DIAGNOSIS — I10 Essential (primary) hypertension: Secondary | ICD-10-CM | POA: Diagnosis present

## 2015-03-03 DIAGNOSIS — E871 Hypo-osmolality and hyponatremia: Secondary | ICD-10-CM | POA: Diagnosis present

## 2015-03-03 DIAGNOSIS — R0602 Shortness of breath: Secondary | ICD-10-CM

## 2015-03-03 DIAGNOSIS — Z923 Personal history of irradiation: Secondary | ICD-10-CM

## 2015-03-03 DIAGNOSIS — Z883 Allergy status to other anti-infective agents status: Secondary | ICD-10-CM

## 2015-03-03 DIAGNOSIS — R1084 Generalized abdominal pain: Secondary | ICD-10-CM | POA: Diagnosis present

## 2015-03-03 DIAGNOSIS — J9601 Acute respiratory failure with hypoxia: Secondary | ICD-10-CM | POA: Diagnosis present

## 2015-03-03 DIAGNOSIS — T80212A Local infection due to central venous catheter, initial encounter: Secondary | ICD-10-CM

## 2015-03-03 DIAGNOSIS — E872 Acidosis: Secondary | ICD-10-CM | POA: Diagnosis present

## 2015-03-03 DIAGNOSIS — I2119 ST elevation (STEMI) myocardial infarction involving other coronary artery of inferior wall: Secondary | ICD-10-CM | POA: Diagnosis present

## 2015-03-03 DIAGNOSIS — R079 Chest pain, unspecified: Secondary | ICD-10-CM | POA: Diagnosis present

## 2015-03-03 DIAGNOSIS — I481 Persistent atrial fibrillation: Secondary | ICD-10-CM | POA: Diagnosis present

## 2015-03-03 DIAGNOSIS — Z95810 Presence of automatic (implantable) cardiac defibrillator: Secondary | ICD-10-CM

## 2015-03-03 DIAGNOSIS — Z6833 Body mass index (BMI) 33.0-33.9, adult: Secondary | ICD-10-CM

## 2015-03-03 DIAGNOSIS — Z803 Family history of malignant neoplasm of breast: Secondary | ICD-10-CM

## 2015-03-03 DIAGNOSIS — Z9861 Coronary angioplasty status: Secondary | ICD-10-CM

## 2015-03-03 DIAGNOSIS — Z91041 Radiographic dye allergy status: Secondary | ICD-10-CM

## 2015-03-03 DIAGNOSIS — J9622 Acute and chronic respiratory failure with hypercapnia: Secondary | ICD-10-CM

## 2015-03-03 LAB — CBC
HCT: 33.9 % — ABNORMAL LOW (ref 36.0–46.0)
HEMOGLOBIN: 11.3 g/dL — AB (ref 12.0–15.0)
MCH: 30.8 pg (ref 26.0–34.0)
MCHC: 33.3 g/dL (ref 30.0–36.0)
MCV: 92.4 fL (ref 78.0–100.0)
Platelets: 249 10*3/uL (ref 150–400)
RBC: 3.67 MIL/uL — ABNORMAL LOW (ref 3.87–5.11)
RDW: 16.3 % — AB (ref 11.5–15.5)
WBC: 6.5 10*3/uL (ref 4.0–10.5)

## 2015-03-03 LAB — BASIC METABOLIC PANEL
ANION GAP: 9 (ref 5–15)
BUN: 24 mg/dL — AB (ref 6–20)
CALCIUM: 8.8 mg/dL — AB (ref 8.9–10.3)
CO2: 29 mmol/L (ref 22–32)
CREATININE: 2.43 mg/dL — AB (ref 0.44–1.00)
Chloride: 96 mmol/L — ABNORMAL LOW (ref 101–111)
GFR calc Af Amer: 23 mL/min — ABNORMAL LOW (ref >60)
GFR, EST NON AFRICAN AMERICAN: 20 mL/min — AB (ref >60)
GLUCOSE: 112 mg/dL — AB (ref 65–99)
Potassium: 3.9 mmol/L (ref 3.5–5.1)
Sodium: 134 mmol/L — ABNORMAL LOW (ref 135–145)

## 2015-03-03 LAB — I-STAT TROPONIN, ED
TROPONIN I, POC: 0.1 ng/mL — AB (ref 0.00–0.08)
Troponin i, poc: 0.08 ng/mL (ref 0.00–0.08)

## 2015-03-03 MED ORDER — NITROGLYCERIN 0.4 MG SL SUBL
0.4000 mg | SUBLINGUAL_TABLET | SUBLINGUAL | Status: DC | PRN
  Administered 2015-03-04 (×2): 0.4 mg via SUBLINGUAL
  Filled 2015-03-03: qty 1

## 2015-03-03 MED ORDER — ONDANSETRON 4 MG PO TBDP
4.0000 mg | ORAL_TABLET | Freq: Once | ORAL | Status: AC
  Administered 2015-03-03: 4 mg via ORAL
  Filled 2015-03-03: qty 1

## 2015-03-03 MED ORDER — LORAZEPAM 2 MG/ML IJ SOLN
0.5000 mg | INTRAMUSCULAR | Status: AC
  Administered 2015-03-04: 0.5 mg via INTRAVENOUS
  Filled 2015-03-03: qty 1

## 2015-03-03 NOTE — Telephone Encounter (Signed)
°*  STAT* If patient is at the pharmacy, call can be transferred to refill team.   1. Which medications need to be refilled? (please list name of each medication and dose if known) Carafate 1 gram-10 ml suspension.   2. Which pharmacy/location (including street and city if local pharmacy) is medication to be sent to? [facility] [phone].   3. Do they need a 30 day or 90 day supply? 10 day supply   Comments: pt will need tablet because it is too expensive on liquid.

## 2015-03-03 NOTE — ED Provider Notes (Signed)
CSN: [id number]     Arrival date & time 03/03/15  1658 History   First MD Initiated Contact with Patient 03/03/15 1730     Chief Complaint  Patient presents with  . Shortness of Breath  . Chest Pain     (Consider location/radiation/quality/duration/timing/severity/associated sxs/prior Treatment) Patient is a 64 y.o. female presenting with chest pain. The history is provided by the patient. No language interpreter was used.  Chest Pain Pain location:  Epigastric and L chest Pain quality: aching   Pain radiates to:  Does not radiate Pain radiates to the back: no   Pain severity:  Moderate Onset quality:  Gradual Duration:  1 day Timing:  Constant Progression:  Worsening Chronicity:  Recurrent Context: breathing   Relieved by:  Nothing Worsened by:  Nothing tried Ineffective treatments:  None tried Associated symptoms: nausea   Associated symptoms: not vomiting   Risk factors: coronary artery disease and hypertension   Risk factors: no prior DVT/PE   Pt reports pain in left chest and left upper abdomen.  Pt reports coughing up blood.  Pt was discharged from hospital yesterday.  Pt reports increased pain today.  Pt called cardiology office and was told to come here.  Pt discharged from hospital yesterday.  Pt reports she was very short of breath earlier.  Pt reports she is breathing easier here now. Pt reports some relief when she took carafate.   Past Medical History  Diagnosis Date  . Ischemic cardiomyopathy     a. s/p MDT dual chamber ICD implanted 2013 by Dr Westley Gambles at [facility], now followed by Dr Rayann Heman. b. LVEF 30-35% by echo 01/2015 at [facility] per report; 35-40% by echo 01/2015 at [facility].  Marland Kitchen History of stroke      a. 1993  . Coronary artery disease     a. s/p previous interventions in [place] per patient, no records available. b. cath 04/2013 with non-obstructive disease. c. STEMI 01/2015 @ [facility] - cardiac cath had shown 100% stenosis of prox PDA, patent stent in RCA with  otherwise normal coronary arteries.  . Lymphoma ([place])     a. s/p chemo/radiation 2009  . Chronic systolic CHF (congestive heart failure) ([place])   . HTN (hypertension)   . HLD (hyperlipidemia)   . Ventricular tachycardia ([place])     a. CL 300 msec requiring ICD shocks therpay 5/14 b. recurrent VT 04/2014, placed on amiodarone c. recurrent VT 05/2014 s/p ablation by Dr Rayann Heman d. s/p repeat ablation 07/2014  . DM2 (diabetes mellitus, type 2), newly diagnosed    . CKD (chronic kidney disease) stage 3, GFR 30-59 ml/min    . Polymyalgia rheumatica ([place])   . Depression   . GERD (gastroesophageal reflux disease)   . Renal atrophy, left     a. [place] 02/2015: "Left renal atrophy, likely from chronic renal arterial stenosis"  . Mitral regurgitation     a. mod by echo 01/2015.  . Diverticulosis     a. By CT 02/2015.  Marland Kitchen PAF (paroxysmal atrial fibrillation) ([place])     a. Brief paroxysms during admission 02/2015 - amiodarone increased. Not placed on anticoag due to dual antiplatelet therapy and bleeding risk including in the setting of possible GI pathology.   . Hiatal hernia     a. by EGD 06/2014.  Marland Kitchen Gastritis     a. by EGD 06/2014.  Marland Kitchen Anxiousness 03/02/2015   Past Surgical History  Procedure Laterality Date  . Cardiac defibrillator placement  03/2011    MDT  ICD implanted at [facility] by Dr Tana Coast  . Coronary angioplasty with stent placement    . Left and right heart catheterization with coronary angiogram N/A 05/03/2013    non-obstructive CAD  . V-tach ablation N/A 05/26/2014    VT ablation by Dr Rayann Heman - PVCs arising from the inferolateral LV, extensive substrate ablation  . Electrophysiologic study N/A 08/04/2014    Procedure: V Tach Ablation;  Surgeon: Thompson Grayer, MD;  Location: [place] [facility];  Service: Cardiovascular;  Laterality: N/A;   Family History  Problem Relation Age of Onset  . Diabetes Father   . Hypertension Father   . Heart disease Father   . Alcoholism Father   . Hypertension Mother    . Heart disease Mother   . Breast cancer Mother   . Stroke Mother   . Diabetes Brother   . Diabetes Brother   . Diabetes Brother   . Diabetes Brother   . Hypertension Sister   . Heart disease Sister   . Alcoholism Sister   . Thyroid disease Sister   . Heart attack Sister   . Heart attack Brother   . Stroke Brother    Social History  Substance Use Topics  . Smoking status: Former Smoker -- 0.25 packs/day    Quit date: 05/20/2014  . Smokeless tobacco: Never Used     Comment: she is not ready to quit  . Alcohol Use: No   OB History    No data available     Review of Systems  Cardiovascular: Positive for chest pain.  Gastrointestinal: Positive for nausea. Negative for vomiting.  All other systems reviewed and are negative.     Allergies  Flagyl; Contrast media; Ioxaglate; Potassium sulfate; and Potassium-containing compounds  Home Medications   Prior to Admission medications   Medication Sig Start Date End Date Taking? Authorizing Provider  ALPRAZolam (XANAX) 0.25 MG tablet Take 1-2 tablets (0.25-0.5 mg total) by mouth 3 (three) times daily as needed for anxiety. 03/02/15  Yes Rexene Alberts, MD  amiodarone (PACERONE) 200 MG tablet Take 1 tablet (200 mg total) by mouth 2 (two) times daily. 02/28/15  Yes Dayna N Dunn, PA-C  aspirin EC 81 MG tablet Take 81 mg by mouth daily.   Yes Historical Provider, MD  atorvastatin (LIPITOR) 40 MG tablet Take 1 tablet (40 mg total) by mouth daily at 6 PM. 06/20/14  Yes Thompson Grayer, MD  carvedilol (COREG) 6.25 MG tablet Take 6.25 mg by mouth 2 (two) times daily with a meal.   Yes Historical Provider, MD  clopidogrel (PLAVIX) 75 MG tablet Take 75 mg by mouth at bedtime.    Yes Historical Provider, MD  furosemide (LASIX) 40 MG tablet Take 1 tablet (40 mg total) by mouth daily. 08/18/14  Yes Amber Sena Slate, NP  glipiZIDE (GLUCOTROL XL) 2.5 MG 24 hr tablet Take 1 tablet (2.5 mg total) by mouth daily with breakfast. DO NOT TAKE THIS MEDICATION  FOR YOUR DIABETES IF YOUR BLOOD SUGARS BELOW 120 AND/OR IF YOU'RE NOT EATING WELL. CHECK YOUR BLOOD SUGARS TWICE DAILY. 03/02/15  Yes Rexene Alberts, MD  hydrALAZINE (APRESOLINE) 10 MG tablet Take 1 tablet (10 mg total) by mouth every 8 (eight) hours. 02/28/15  Yes Dayna N Dunn, PA-C  isosorbide mononitrate (IMDUR) 30 MG 24 hr tablet Take 1 tablet (30 mg total) by mouth daily. 02/28/15  Yes Dayna N Dunn, PA-C  Multiple Vitamin (MULTI VITAMIN DAILY PO) Take 1 tablet by mouth daily.   Yes Historical  Provider, MD  pantoprazole (PROTONIX) 40 MG tablet Take 1 tablet (40 mg total) by mouth 2 (two) times daily. 02/28/15  Yes Dayna N Dunn, PA-C  ranolazine (RANEXA) 500 MG 12 hr tablet Take 1 tablet (500 mg total) by mouth 2 (two) times daily. 02/28/15  Yes Dayna N Dunn, PA-C  sucralfate (CARAFATE) 1 GM/10ML suspension Take 10 mLs (1 g total) by mouth 4 (four) times daily -  with meals and at bedtime. 02/28/15  Yes Dayna N Dunn, PA-C  traMADol (ULTRAM) 50 MG tablet Take 1 tablet (50 mg total) by mouth every 12 (twelve) hours as needed for moderate pain or severe pain. 02/28/15  Yes Dayna N Dunn, PA-C  nitroGLYCERIN (NITROSTAT) 0.4 MG SL tablet Place 1 tablet (0.4 mg total) under the tongue every 5 (five) minutes as needed for chest pain. Patient not taking: Reported on 03/01/2015 07/04/14   Patsey Berthold, NP   BP 151/92 mmHg  Pulse 78  Temp(Src) 98.8 F (37.1 C) (Oral)  Resp 16  Ht 5\' 5"  (1.651 m)  Wt 81.647 kg  BMI 29.95 kg/m2  SpO2 100% Physical Exam  Constitutional: She is oriented to person, place, and time. She appears well-developed and well-nourished.  HENT:  Head: Normocephalic and atraumatic.  Eyes: EOM are normal. Pupils are equal, round, and reactive to light.  Neck: Normal range of motion.  Cardiovascular: Normal rate, regular rhythm and normal heart sounds.   Pulmonary/Chest: Effort normal.  Abdominal: Soft. She exhibits no distension.  Musculoskeletal: Normal range of motion.  Neurological:  She is alert and oriented to person, place, and time.  Skin: Skin is warm.  Psychiatric: She has a normal mood and affect.  Nursing note and vitals reviewed.   ED Course  Procedures (including critical care time) Labs Review Labs Reviewed  BASIC METABOLIC PANEL - Abnormal; Notable for the following:    Sodium 134 (*)    Chloride 96 (*)    Glucose, Bld 112 (*)    BUN 24 (*)    Creatinine, Ser 2.43 (*)    Calcium 8.8 (*)    GFR calc non Af Amer 20 (*)    GFR calc Af Amer 23 (*)    All other components within normal limits  CBC - Abnormal; Notable for the following:    RBC 3.67 (*)    Hemoglobin 11.3 (*)    HCT 33.9 (*)    RDW 16.3 (*)    All other components within normal limits  I-STAT TROPOININ, ED - Abnormal; Notable for the following:    Troponin i, poc 0.10 (*)    All other components within normal limits    Imaging Review Dg Chest 2 View  03/03/2015  CLINICAL DATA:  Central chest pain and shortness of breath for 3 hours. EXAM: CHEST  2 VIEW COMPARISON:  Single AP view 03/01/2015 FINDINGS: Dual lead left-sided pacemaker remains in place. Improving bibasilar aeration from prior exam. Minimal residual right perihilar opacity persists. Cardiomediastinal contours are unchanged. No pleural effusion or pneumothorax. No acute osseous abnormalities. IMPRESSION: Improving bibasilar aeration. Minimal residual right perihilar opacity, may reflect resolving edema versus atelectasis. Exam is otherwise unchanged. Electronically Signed   By: Jeb Levering M.D.   On: 03/03/2015 18:06   I have personally reviewed and evaluated these images and lab results as part of my medical decision-making.   EKG Interpretation   Date/Time:  Friday March 03 2015 16:58:29 EST Ventricular Rate:  78 PR Interval:  198 QRS Duration: 110  QT Interval:  467 QTC Calculation: 532 R Axis:   -17 Text Interpretation:  Sinus rhythm Consider left atrial enlargement Left  ventricular hypertrophy Inferior  infarct, old Anterior Q waves, possibly  due to LVH Prolonged QT interval No significant change since last tracing  Confirmed by KNAPP  MD-J, JON [id number]) on 03/03/2015 5:17:35 PM      MDM Pt had a cardiac cath in [place] in November. Pt recently discharged from [facility].  Pt had negative VQ scan 2 days ago.  Troponin is 0.10 and repeat troponin is 0.08.   Pt had a second episode of pain at 10:00pm.  Pt coughed up phelgm with small streak of blood.   Pt complains no one is doing anything to make her better.  Cardiologist has changed medications with no relief. Pt reports she is taking her medications as directed   Final diagnoses:  Chest pain, unspecified chest pain type  Shortness of breath    I spoke to Dr. Tamala Julian hospitalist who will admit    Fransico Meadow, PA-C 03/03/15 2324  Dorie Rank, MD 03/04/15 [phone]

## 2015-03-03 NOTE — ED Notes (Signed)
Pt complaining of nausea and had episode of dry heaving

## 2015-03-03 NOTE — Telephone Encounter (Signed)
Calling back stating she is very SOB, and coughing bright red blood every time she coughs.  She is on Plavix.  States she has taken all of her medications this morning.  Advised to go to [facility] ER.  States her sister is with her and she can take her.  Notified Bennett Scrape of pt going to ER.

## 2015-03-03 NOTE — Telephone Encounter (Signed)
Autumn at [facility] calling stating that a prescription for Carafate was given to pt.  Her insurance will not pay for the suspension so calling to get another medication or tablets.  Advised that Dr. Watt Climes her GI physician will have to advise on the Rx. Told Autumn that I would call Dr. Perley Jain office and get them to call Rx into them. Spoke w/pt.  She was d/c from [facility] on 12/6 then readmitted to [facility] on 12/7 for SOB.  Was d/c yesterday 12/8.  States she is feeling much better. Was scheduled to come in today for labs but will cancel those since she had labs yesterday in hospital.  Is scheduled to see Kathrene Alu on Tues 12/13 as post hospital visit and Cecille Rubin will order labs to check her creatinine. She verbalizes understanding of her medications.  States she is weighting every day and advised her to call if wt is greater than 2 lbs in a day; has swelling in feet or becomes SOB.  States she knows when she has gotten extra fluid because her abdomen becomes bloated. Also advised that I would be calling Dr. Perley Jain office regarding her Carafate Rx. Called Dr. Perley Jain office who states they will pass message to get another Rx called into [facility] store. Phone number is [phone].

## 2015-03-03 NOTE — ED Notes (Signed)
Walked into room and pt stated that she was sob and having epigastric pain, PA notified and in room. New ekg taken and pt is is normal sinus rhythm. Pt calm now.

## 2015-03-03 NOTE — ED Notes (Addendum)
[facility] em[facility]. Coming from home with c/o shortness of breath and chest pain. Pt. Diagnosed with pneumonia two weeks ago in hospital in [place], [place]. Pt. Also hx of MI a few weeks ago with cardiac cath. Pt. Has previous hx of 2x stents. Pt. Has not taken her amiodarone today. EKG NSR en route. Pt. Denies dizziness/lightheadness/nausea. AAOx4. [id number] en route.

## 2015-03-04 ENCOUNTER — Encounter (HOSPITAL_COMMUNITY): Payer: Self-pay

## 2015-03-04 DIAGNOSIS — R042 Hemoptysis: Secondary | ICD-10-CM | POA: Insufficient documentation

## 2015-03-04 DIAGNOSIS — J209 Acute bronchitis, unspecified: Secondary | ICD-10-CM

## 2015-03-04 DIAGNOSIS — I208 Other forms of angina pectoris: Secondary | ICD-10-CM | POA: Diagnosis present

## 2015-03-04 DIAGNOSIS — F419 Anxiety disorder, unspecified: Secondary | ICD-10-CM

## 2015-03-04 DIAGNOSIS — J208 Acute bronchitis due to other specified organisms: Secondary | ICD-10-CM

## 2015-03-04 DIAGNOSIS — E669 Obesity, unspecified: Secondary | ICD-10-CM

## 2015-03-04 DIAGNOSIS — E1169 Type 2 diabetes mellitus with other specified complication: Secondary | ICD-10-CM

## 2015-03-04 DIAGNOSIS — Z95810 Presence of automatic (implantable) cardiac defibrillator: Secondary | ICD-10-CM | POA: Diagnosis not present

## 2015-03-04 DIAGNOSIS — R079 Chest pain, unspecified: Secondary | ICD-10-CM | POA: Diagnosis not present

## 2015-03-04 DIAGNOSIS — R7989 Other specified abnormal findings of blood chemistry: Secondary | ICD-10-CM | POA: Diagnosis not present

## 2015-03-04 LAB — MAGNESIUM: Magnesium: 2.3 mg/dL (ref 1.7–2.4)

## 2015-03-04 LAB — CBC
HCT: 32.7 % — ABNORMAL LOW (ref 36.0–46.0)
Hemoglobin: 11.2 g/dL — ABNORMAL LOW (ref 12.0–15.0)
MCH: 31.6 pg (ref 26.0–34.0)
MCHC: 34.3 g/dL (ref 30.0–36.0)
MCV: 92.4 fL (ref 78.0–100.0)
PLATELETS: 240 10*3/uL (ref 150–400)
RBC: 3.54 MIL/uL — ABNORMAL LOW (ref 3.87–5.11)
RDW: 16.4 % — AB (ref 11.5–15.5)
WBC: 6 10*3/uL (ref 4.0–10.5)

## 2015-03-04 LAB — GLUCOSE, CAPILLARY
GLUCOSE-CAPILLARY: 109 mg/dL — AB (ref 65–99)
GLUCOSE-CAPILLARY: 143 mg/dL — AB (ref 65–99)
Glucose-Capillary: 92 mg/dL (ref 65–99)
Glucose-Capillary: 81 mg/dL (ref 65–99)
Glucose-Capillary: 132 mg/dL — ABNORMAL HIGH (ref 65–99)

## 2015-03-04 LAB — CREATININE, SERUM
CREATININE: 2.26 mg/dL — AB (ref 0.44–1.00)
GFR calc Af Amer: 25 mL/min — ABNORMAL LOW (ref >60)
GFR calc non Af Amer: 22 mL/min — ABNORMAL LOW (ref >60)

## 2015-03-04 LAB — T4, FREE: Free T4: 1.01 ng/dL (ref 0.61–1.12)

## 2015-03-04 MED ORDER — AMIODARONE HCL 200 MG PO TABS
200.0000 mg | ORAL_TABLET | Freq: Two times a day (BID) | ORAL | Status: DC
  Administered 2015-03-04 – 2015-03-09 (×11): 200 mg via ORAL
  Filled 2015-03-04 (×11): qty 1

## 2015-03-04 MED ORDER — NITROGLYCERIN 0.4 MG SL SUBL: 0.4000 mg | SUBLINGUAL_TABLET | SUBLINGUAL | Status: DC | PRN

## 2015-03-04 MED ORDER — ALPRAZOLAM 0.25 MG PO TABS
0.2500 mg | ORAL_TABLET | Freq: Three times a day (TID) | ORAL | Status: DC | PRN
  Administered 2015-03-04 (×2): 0.5 mg via ORAL
  Administered 2015-03-05: 0.25 mg via ORAL
  Administered 2015-03-05 – 2015-03-06 (×2): 0.5 mg via ORAL
  Filled 2015-03-04 (×6): qty 2

## 2015-03-04 MED ORDER — ASPIRIN EC 81 MG PO TBEC
81.0000 mg | DELAYED_RELEASE_TABLET | Freq: Every day | ORAL | Status: DC
  Administered 2015-03-04 – 2015-03-12 (×8): 81 mg via ORAL
  Filled 2015-03-04 (×10): qty 1

## 2015-03-04 MED ORDER — ATORVASTATIN CALCIUM 40 MG PO TABS
40.0000 mg | ORAL_TABLET | Freq: Every day | ORAL | Status: DC
  Administered 2015-03-04 – 2015-03-12 (×7): 40 mg via ORAL
  Filled 2015-03-04 (×9): qty 1

## 2015-03-04 MED ORDER — FUROSEMIDE 40 MG PO TABS
40.0000 mg | ORAL_TABLET | Freq: Every day | ORAL | Status: DC
  Administered 2015-03-04 – 2015-03-06 (×3): 40 mg via ORAL
  Filled 2015-03-04 (×3): qty 1

## 2015-03-04 MED ORDER — CARVEDILOL 6.25 MG PO TABS
6.2500 mg | ORAL_TABLET | Freq: Two times a day (BID) | ORAL | Status: DC
  Administered 2015-03-04: 6.25 mg via ORAL
  Filled 2015-03-04: qty 1

## 2015-03-04 MED ORDER — ONDANSETRON HCL 4 MG/2ML IJ SOLN
4.0000 mg | Freq: Four times a day (QID) | INTRAMUSCULAR | Status: DC | PRN
  Administered 2015-03-04 – 2015-03-09 (×9): 4 mg via INTRAVENOUS
  Filled 2015-03-04 (×10): qty 2

## 2015-03-04 MED ORDER — CARVEDILOL 6.25 MG PO TABS
9.3750 mg | ORAL_TABLET | Freq: Two times a day (BID) | ORAL | Status: DC
  Administered 2015-03-04: 9.375 mg via ORAL
  Filled 2015-03-04: qty 1

## 2015-03-04 MED ORDER — INSULIN ASPART 100 UNIT/ML ~~LOC~~ SOLN
0.0000 [IU] | SUBCUTANEOUS | Status: DC
  Administered 2015-03-04 (×2): 1 [IU] via SUBCUTANEOUS
  Administered 2015-03-05: 2 [IU] via SUBCUTANEOUS

## 2015-03-04 MED ORDER — CLOPIDOGREL BISULFATE 75 MG PO TABS
75.0000 mg | ORAL_TABLET | Freq: Every day | ORAL | Status: DC
  Administered 2015-03-04 – 2015-03-11 (×8): 75 mg via ORAL
  Filled 2015-03-04 (×9): qty 1

## 2015-03-04 MED ORDER — ISOSORBIDE MONONITRATE ER 30 MG PO TB24
30.0000 mg | ORAL_TABLET | Freq: Every day | ORAL | Status: DC
  Administered 2015-03-04 – 2015-03-06 (×3): 30 mg via ORAL
  Filled 2015-03-04 (×3): qty 1

## 2015-03-04 MED ORDER — HYDRALAZINE HCL 10 MG PO TABS
10.0000 mg | ORAL_TABLET | Freq: Three times a day (TID) | ORAL | Status: DC
  Administered 2015-03-04 – 2015-03-06 (×9): 10 mg via ORAL
  Filled 2015-03-04 (×9): qty 1

## 2015-03-04 MED ORDER — ACETAMINOPHEN 325 MG PO TABS
650.0000 mg | ORAL_TABLET | ORAL | Status: DC | PRN
Start: 2015-03-04 — End: 2015-03-06

## 2015-03-04 MED ORDER — SUCRALFATE 1 GM/10ML PO SUSP
1.0000 g | Freq: Three times a day (TID) | ORAL | Status: DC
  Administered 2015-03-04 – 2015-03-06 (×12): 1 g via ORAL
  Filled 2015-03-04 (×14): qty 10

## 2015-03-04 MED ORDER — PANTOPRAZOLE SODIUM 40 MG PO TBEC
40.0000 mg | DELAYED_RELEASE_TABLET | Freq: Two times a day (BID) | ORAL | Status: DC
  Administered 2015-03-04 – 2015-03-06 (×6): 40 mg via ORAL
  Filled 2015-03-04 (×6): qty 1

## 2015-03-04 MED ORDER — ADULT MULTIVITAMIN W/MINERALS CH
1.0000 | ORAL_TABLET | Freq: Every day | ORAL | Status: DC
  Administered 2015-03-04 – 2015-03-06 (×3): 1 via ORAL
  Filled 2015-03-04 (×3): qty 1

## 2015-03-04 MED ORDER — CARVEDILOL 6.25 MG PO TABS
9.3750 mg | ORAL_TABLET | Freq: Two times a day (BID) | ORAL | Status: DC
  Administered 2015-03-05 – 2015-03-09 (×9): 9.375 mg via ORAL
  Filled 2015-03-04 (×11): qty 1

## 2015-03-04 MED ORDER — TRAMADOL HCL 50 MG PO TABS
50.0000 mg | ORAL_TABLET | Freq: Two times a day (BID) | ORAL | Status: DC | PRN
  Administered 2015-03-04 – 2015-03-05 (×3): 50 mg via ORAL
  Filled 2015-03-04 (×4): qty 1

## 2015-03-04 MED ORDER — HEPARIN SODIUM (PORCINE) 5000 UNIT/ML IJ SOLN
5000.0000 [IU] | Freq: Three times a day (TID) | INTRAMUSCULAR | Status: DC
  Administered 2015-03-04 – 2015-03-06 (×7): 5000 [IU] via SUBCUTANEOUS
  Filled 2015-03-04 (×7): qty 1

## 2015-03-04 MED ORDER — RANOLAZINE ER 500 MG PO TB12
500.0000 mg | ORAL_TABLET | Freq: Two times a day (BID) | ORAL | Status: DC
  Administered 2015-03-04 – 2015-03-05 (×4): 500 mg via ORAL
  Filled 2015-03-04 (×4): qty 1

## 2015-03-04 MED ORDER — MORPHINE SULFATE (PF) 2 MG/ML IV SOLN
2.0000 mg | INTRAVENOUS | Status: DC | PRN
  Administered 2015-03-06: 2 mg via INTRAVENOUS
  Filled 2015-03-04: qty 1

## 2015-03-04 NOTE — Progress Notes (Signed)
Chart reviewed. Patient examined. Admitted after midnight.  Pt very sleepy and difficult to get any hx. No reported problems.  Unclear whether cardiology consulted by admitting MD. Will call.  Had negative VQ on 12/1  Doree Barthel, MD [facility] [email or web address]

## 2015-03-04 NOTE — Consult Note (Signed)
Consulting cardiologist: Dr Carlyle Dolly MD  Clinical Summary [name] Terry is a 64 y.o.female with history of CAD, HTN, ICM with dual chamber pacemaker, history of VT ablation 04/2014 and 07/2004, HL, CKD stage III, depression, PAF on admiodarone but has not been on anticoag due to being on DAPT and her GI symptoms and risk of bleeding according to prior notes admitted with chest pain and SOB. There are several admissions noted back to back this month for these same symptoms.   She describes a left sided chest pain that comes on at times, can often be associated with palpitations. This is different from an intermittent lower abdominal pain that also is recurring.   She was recently admitted to [facility] 01/2015 with inferior STEMI, she had 100% PDA, it appears the was medically managed. At prior admission cath films were reviewed by interventional and it was agreed that this lesions was not amenable to pci.   Trop 0.08, Cr 2.43, BUN 24, K 3.9 01/2015 LVEF 35-40%, mod MR (overall stable LVEF from 04/2014 prior to recent MI) EKG SR, inferior infarct  Allergies  Allergen Reactions  . Flagyl [Metronidazole] Swelling and Rash    Face swells   . Contrast Media [Iodinated Diagnostic Agents] Rash  . Ioxaglate Rash  . Potassium Sulfate Rash and Other (See Comments)    Headaches  . Potassium-Containing Compounds Other (See Comments)    Headaches-- reports no problems now    Medications Scheduled Medications: . amiodarone  200 mg Oral BID  . aspirin EC  81 mg Oral Daily  . atorvastatin  40 mg Oral q1800  . carvedilol  6.25 mg Oral BID WC  . clopidogrel  75 mg Oral QHS  . furosemide  40 mg Oral Daily  . heparin  5,000 Units Subcutaneous 3 times per day  . hydrALAZINE  10 mg Oral 3 times per day  . insulin aspart  0-9 Units Subcutaneous 6 times per day  . isosorbide mononitrate  30 mg Oral Daily  . multivitamin with minerals  1 tablet Oral Daily  . pantoprazole  40 mg Oral  BID  . ranolazine  500 mg Oral BID  . sucralfate  1 g Oral TID WC & HS     Infusions:     PRN Medications:  acetaminophen, ALPRAZolam, morphine injection, nitroGLYCERIN, ondansetron (ZOFRAN) IV, traMADol   Past Medical History  Diagnosis Date  . Ischemic cardiomyopathy     a. s/p MDT dual chamber ICD implanted 2013 by Dr Westley Gambles at [facility], now followed by Dr Rayann Heman. b. LVEF 30-35% by echo 01/2015 at [facility] per report; 35-40% by echo 01/2015 at [facility].  Marland Kitchen History of stroke      a. 1993  . Coronary artery disease     a. s/p previous interventions in [place] per patient, no records available. b. cath 04/2013 with non-obstructive disease. c. STEMI 01/2015 @ [facility] - cardiac cath had shown 100% stenosis of prox PDA, patent stent in RCA with otherwise normal coronary arteries.  . Lymphoma ([place])     a. s/p chemo/radiation 2009  . Chronic systolic CHF (congestive heart failure) ([place])   . HTN (hypertension)   . HLD (hyperlipidemia)   . Ventricular tachycardia ([place])     a. CL 300 msec requiring ICD shocks therpay 5/14 b. recurrent VT 04/2014, placed on amiodarone c. recurrent VT 05/2014 s/p ablation by Dr Rayann Heman d. s/p repeat ablation 07/2014  . DM2 (diabetes mellitus, type 2), newly diagnosed    .  CKD (chronic kidney disease) stage 3, GFR 30-59 ml/min    . Polymyalgia rheumatica ([place])   . Depression   . GERD (gastroesophageal reflux disease)   . Renal atrophy, left     a. [place] 02/2015: "Left renal atrophy, likely from chronic renal arterial stenosis"  . Mitral regurgitation     a. mod by echo 01/2015.  . Diverticulosis     a. By CT 02/2015.  Marland Kitchen PAF (paroxysmal atrial fibrillation) ([place])     a. Brief paroxysms during admission 02/2015 - amiodarone increased. Not placed on anticoag due to dual antiplatelet therapy and bleeding risk including in the setting of possible GI pathology.   . Hiatal hernia     a. by EGD 06/2014.  Marland Kitchen Gastritis     a. by EGD 06/2014.  Marland Kitchen Anxiousness 03/02/2015     Past Surgical History  Procedure Laterality Date  . Cardiac defibrillator placement  03/2011    MDT ICD implanted at [facility] by Dr Tana Coast  . Coronary angioplasty with stent placement    . Left and right heart catheterization with coronary angiogram N/A 05/03/2013    non-obstructive CAD  . V-tach ablation N/A 05/26/2014    VT ablation by Dr Rayann Heman - PVCs arising from the inferolateral LV, extensive substrate ablation  . Electrophysiologic study N/A 08/04/2014    Procedure: V Tach Ablation;  Surgeon: Thompson Grayer, MD;  Location: [place] [facility];  Service: Cardiovascular;  Laterality: N/A;    Family History  Problem Relation Age of Onset  . Diabetes Father   . Hypertension Father   . Heart disease Father   . Alcoholism Father   . Hypertension Mother   . Heart disease Mother   . Breast cancer Mother   . Stroke Mother   . Diabetes Brother   . Diabetes Brother   . Diabetes Brother   . Diabetes Brother   . Hypertension Sister   . Heart disease Sister   . Alcoholism Sister   . Thyroid disease Sister   . Heart attack Sister   . Heart attack Brother   . Stroke Brother     Social History Ms. Deriso reports that she quit smoking about 9 months ago. She has never used smokeless tobacco. Ms. Dunwell reports that she does not drink alcohol.  Review of Systems CONSTITUTIONAL: No weight loss, fever, chills, weakness or fatigue.  HEENT: Eyes: No visual loss, blurred vision, double vision or yellow sclerae. No hearing loss, sneezing, congestion, runny nose or sore throat.  SKIN: No rash or itching.  CARDIOVASCULAR: per hpi.  RESPIRATORY: per hpi GASTROINTESTINAL: No anorexia, nausea, vomiting or diarrhea. No abdominal pain or blood.  GENITOURINARY: no polyuria, no dysuria NEUROLOGICAL: No headache, dizziness, syncope, paralysis, ataxia, numbness or tingling in the extremities. No change in bowel or bladder control.  MUSCULOSKELETAL: No muscle, back pain, joint pain or stiffness.   HEMATOLOGIC: No anemia, bleeding or bruising.  LYMPHATICS: No enlarged nodes. No history of splenectomy.  PSYCHIATRIC: No history of depression or anxiety.      Physical Examination Blood pressure 106/69, pulse 76, temperature 98.1 F (36.7 C), temperature source Oral, resp. rate 20, height 5\' 5"  (1.651 m), weight 198 lb 1.6 oz (89.858 kg), SpO2 100 %.  Intake/Output Summary (Last 24 hours) at 03/04/15 1420 Last data filed at 03/04/15 1356  Gross per 24 hour  Intake    420 ml  Output    400 ml  Net     20 ml  HEENT: sclera clear  Cardiovascular: RRR, no m/r/g, nojvd  Respiratory CTAB  GI: abdomen soft, NT, nd  MSK: no LE edema  Neuro: no focal deficits  Psych: appropriate affect   Lab Results  Basic Metabolic Panel:  Recent Labs Lab 02/26/15 1402 02/27/15 0444 02/28/15 0436 03/01/15 0838 03/02/15 0524 03/03/15 1716 03/04/15 0414  NA 134* 136 132* 134* 134* 134*  --   K 4.1 3.8 4.2 4.0 3.7 3.9  --   CL 98* 100* 99* 97* 98* 96*  --   CO2 27 28 27 25 29 29   --   GLUCOSE 162* 78 110* 102* 114* 112*  --   BUN 21* 19 21* 28* 30* 24*  --   CREATININE 2.19* 2.06* 2.31* 2.53* 2.65* 2.43* 2.26*  CALCIUM 8.7* 8.8* 8.7* 8.9 8.7* 8.8*  --   MG 2.4 2.6*  --   --   --   --   --     Liver Function Tests:  Recent Labs Lab 02/26/15 1520  AST 56*  ALT 55*  ALKPHOS 61    CBC:  Recent Labs Lab 02/26/15 1402 03/01/15 0838 03/02/15 0524 03/03/15 1716 03/04/15 0414  WBC 14.8* 9.8 8.8 6.5 6.0  NEUTROABS  --  7.7  --   --   --   HGB 10.7* 11.6* 11.4* 11.3* 11.2*  HCT 32.3* 33.6* 33.9* 33.9* 32.7*  MCV 93.1 92.6 93.1 92.4 92.4  PLT 278 293 281 249 240    Cardiac Enzymes:  Recent Labs Lab 02/25/15 2127 03/01/15 0838 03/01/15 1643 03/01/15 2211 03/02/15 0524  TROPONINI 0.63* 0.22* 0.16* 0.19* 0.19*    BNP: Invalid input(s): POCBNP   ECG   Imaging   Impression/Recommendations  1. CAD - fairly complex history, recent STEMI in [place]  with occluded PDA that was medically managed, otherwise patent vessels. From notes interventional here has reviewed films and agree lesion is not amenable to revasc - troponin trending down from recent elevation, no new spike. EKG with evidence of old infarct. LVEF fairly stable from study earlier this year, despite infarction did not take a new hit to her LVEF - unclear if ongoing pain is cardiac, no revasc options. Ekg and troponins trend not consistent with new infarct. Continue medical therapy, increase coreg for antianginal and to help with afib rates.  Possible GI etiology workup per GI and primary team.   2. Chronic sysotolic HF - echo [id number] LVEF 35-40% which is chronic - medical therapy with coreg, hydral/nitrates. No ACE or aldactone given poor renal function - weights are inaccurate.  - she is on lasix 40mg  daily, I/Os incomplete. Appears euvolemic by exam, continue oral lasix.  - frequent exacerbation may be due to episodes of afib with RVR   3. Afib - on amio and coreg - has not been on anticoag due to being on DAPT and chronic GI symptoms with risk for bleed according to prior notes. With recent hemoptysis would also avoid - chest pain last night with palpitations, she did have some afib with elevated rates, potentially the etiology of her pain - will increase coreg to 9.375mg  bid.   4. Hemoptysis - per primary team, Hgb stable. Would continue DAPT  5. Hx of VT - prior ablations, remains on amiodarone. Has ICD.   Carlyle Dolly, M.D.

## 2015-03-04 NOTE — H&P (Signed)
[facility] History and Physical  [name] Terry [id number] DOB: 1951-01-31 DOA: 03/03/2015  Referring physician: ED PCP: Bronson Curb, PA-C   Chief Complaint: Chest pain and coughing up blood  HPI:  [name] Terry is a 64 year old female with a past medical history of ischemic cardiomyopathy, chronic systolic congestive heart failure last EF 35-40% in 01/2015, CKD, DM2, CAD, STEMI in November 2016-treated medically at [facility] with a cardiac catheterization showing 100% stenosis of the proximal PDA; who presents with continued chest pain and reports of coughing up blood. Patient was just admitted on on 12/7 for similar complaints. Patient reports taking all medications as advised but continues to have chest pain. She reported called her cardiologist's office for advice on what to do about her chest pain complaints, but was advised to come into the hospital. Patient reports that she's still intermittently having substernal chest pain that is a 10 out of 10 and sharp. Associated symptoms include, hemoptysis, difficulty breathing, nausea,  and anxiety. Patient reports being unable to lie flat as it feels as though she suffocating. Prior to coming in today she states that she tried a nitroglycerin once and was also given 1 by EMS prior to arrival. Nitroglycerin helps relieve pain for a short period in time and then it returns. Patient notes symptoms of hemoptysis first occurred during her ICU stay at [facility].  At that time patient noted coughing up clots of blood. It seems these symptoms have now improved some and only report streaks mixed with sputum production. Upon arrival hemoglobin and hematocrit appear to be similar to appears to be patient's baseline.   Review of Systems  Constitutional: Positive for malaise/fatigue. Negative for fever and chills.  HENT: Negative for ear discharge and hearing loss.   Eyes: Negative for photophobia and pain.  Respiratory:  Positive for cough, hemoptysis, sputum production and shortness of breath.   Cardiovascular: Positive for chest pain, palpitations and orthopnea. Negative for leg swelling.  Gastrointestinal: Positive for nausea.  Skin: Negative for itching and rash.  Neurological: Positive for tremors.  Endo/Heme/Allergies: Negative for polydipsia. Bruises/bleeds easily.  Psychiatric/Behavioral: The patient is nervous/anxious and has insomnia.         Past Medical History  Diagnosis Date  . Ischemic cardiomyopathy     a. s/p MDT dual chamber ICD implanted 2013 by Dr Westley Gambles at [facility], now followed by Dr Rayann Heman. b. LVEF 30-35% by echo 01/2015 at [facility] per report; 35-40% by echo 01/2015 at [facility].  Marland Kitchen History of stroke      a. 1993  . Coronary artery disease     a. s/p previous interventions in [place] per patient, no records available. b. cath 04/2013 with non-obstructive disease. c. STEMI 01/2015 @ [place] - cardiac cath had shown 100% stenosis of prox PDA, patent stent in RCA with otherwise normal coronary arteries.  . Lymphoma ([place])     a. s/p chemo/radiation 2009  . Chronic systolic CHF (congestive heart failure) ([place])   . HTN (hypertension)   . HLD (hyperlipidemia)   . Ventricular tachycardia ([place])     a. CL 300 msec requiring ICD shocks therpay 5/14 b. recurrent VT 04/2014, placed on amiodarone c. recurrent VT 05/2014 s/p ablation by Dr Rayann Heman d. s/p repeat ablation 07/2014  . DM2 (diabetes mellitus, type 2), newly diagnosed    . CKD (chronic kidney disease) stage 3, GFR 30-59 ml/min    . Polymyalgia rheumatica ([place])   . Depression   . GERD (gastroesophageal reflux disease)   .  Renal atrophy, left     a. [place] 02/2015: "Left renal atrophy, likely from chronic renal arterial stenosis"  . Mitral regurgitation     a. mod by echo 01/2015.  . Diverticulosis     a. By CT 02/2015.  Marland Kitchen PAF (paroxysmal atrial fibrillation) ([place])     a. Brief paroxysms during admission 02/2015 - amiodarone increased. Not  placed on anticoag due to dual antiplatelet therapy and bleeding risk including in the setting of possible GI pathology.   . Hiatal hernia     a. by EGD 06/2014.  Marland Kitchen Gastritis     a. by EGD 06/2014.  Marland Kitchen Anxiousness 03/02/2015     Past Surgical History  Procedure Laterality Date  . Cardiac defibrillator placement  03/2011    MDT ICD implanted at [facility] by Dr Tana Coast  . Coronary angioplasty with stent placement    . Left and right heart catheterization with coronary angiogram N/A 05/03/2013    non-obstructive CAD  . V-tach ablation N/A 05/26/2014    VT ablation by Dr Rayann Heman - PVCs arising from the inferolateral LV, extensive substrate ablation  . Electrophysiologic study N/A 08/04/2014    Procedure: V Tach Ablation;  Surgeon: Thompson Grayer, MD;  Location: [facility];  Service: Cardiovascular;  Laterality: N/A;      Social History:  reports that she quit smoking about 9 months ago. She has never used smokeless tobacco. She reports that she does not drink alcohol or use illicit drugs. Where does patient live--home Can patient participate in ADLs? yes  Allergies  Allergen Reactions  . Flagyl [Metronidazole] Swelling and Rash    Face swells   . Contrast Media [Iodinated Diagnostic Agents] Rash  . Ioxaglate Rash  . Potassium Sulfate Rash and Other (See Comments)    Headaches  . Potassium-Containing Compounds Other (See Comments)    Headaches-- reports no problems now    Family History  Problem Relation Age of Onset  . Diabetes Father   . Hypertension Father   . Heart disease Father   . Alcoholism Father   . Hypertension Mother   . Heart disease Mother   . Breast cancer Mother   . Stroke Mother   . Diabetes Brother   . Diabetes Brother   . Diabetes Brother   . Diabetes Brother   . Hypertension Sister   . Heart disease Sister   . Alcoholism Sister   . Thyroid disease Sister   . Heart attack Sister   . Heart attack Brother   . Stroke Brother         Prior to  Admission medications   Medication Sig Start Date End Date Taking? Authorizing Provider  ALPRAZolam (XANAX) 0.25 MG tablet Take 1-2 tablets (0.25-0.5 mg total) by mouth 3 (three) times daily as needed for anxiety. 03/02/15  Yes Rexene Alberts, MD  amiodarone (PACERONE) 200 MG tablet Take 1 tablet (200 mg total) by mouth 2 (two) times daily. 02/28/15  Yes Dayna N Dunn, PA-C  aspirin EC 81 MG tablet Take 81 mg by mouth daily.   Yes Historical Provider, MD  atorvastatin (LIPITOR) 40 MG tablet Take 1 tablet (40 mg total) by mouth daily at 6 PM. 06/20/14  Yes Thompson Grayer, MD  carvedilol (COREG) 6.25 MG tablet Take 6.25 mg by mouth 2 (two) times daily with a meal.   Yes Historical Provider, MD  clopidogrel (PLAVIX) 75 MG tablet Take 75 mg by mouth at bedtime.    Yes Historical Provider, MD  furosemide (LASIX) 40 MG tablet Take 1 tablet (40 mg total) by mouth daily. 08/18/14  Yes Amber Sena Slate, NP  glipiZIDE (GLUCOTROL XL) 2.5 MG 24 hr tablet Take 1 tablet (2.5 mg total) by mouth daily with breakfast. DO NOT TAKE THIS MEDICATION FOR YOUR DIABETES IF YOUR BLOOD SUGARS BELOW 120 AND/OR IF YOU'RE NOT EATING WELL. CHECK YOUR BLOOD SUGARS TWICE DAILY. 03/02/15  Yes Rexene Alberts, MD  hydrALAZINE (APRESOLINE) 10 MG tablet Take 1 tablet (10 mg total) by mouth every 8 (eight) hours. 02/28/15  Yes Dayna N Dunn, PA-C  isosorbide mononitrate (IMDUR) 30 MG 24 hr tablet Take 1 tablet (30 mg total) by mouth daily. 02/28/15  Yes Dayna N Dunn, PA-C  Multiple Vitamin (MULTI VITAMIN DAILY PO) Take 1 tablet by mouth daily.   Yes Historical Provider, MD  pantoprazole (PROTONIX) 40 MG tablet Take 1 tablet (40 mg total) by mouth 2 (two) times daily. 02/28/15  Yes Dayna N Dunn, PA-C  ranolazine (RANEXA) 500 MG 12 hr tablet Take 1 tablet (500 mg total) by mouth 2 (two) times daily. 02/28/15  Yes Dayna N Dunn, PA-C  sucralfate (CARAFATE) 1 GM/10ML suspension Take 10 mLs (1 g total) by mouth 4 (four) times daily -  with meals and at bedtime.  02/28/15  Yes Dayna N Dunn, PA-C  traMADol (ULTRAM) 50 MG tablet Take 1 tablet (50 mg total) by mouth every 12 (twelve) hours as needed for moderate pain or severe pain. 02/28/15  Yes Dayna N Dunn, PA-C  nitroGLYCERIN (NITROSTAT) 0.4 MG SL tablet Place 1 tablet (0.4 mg total) under the tongue every 5 (five) minutes as needed for chest pain. Patient not taking: Reported on 03/01/2015 07/04/14   Patsey Berthold, NP     Physical Exam: Filed Vitals:   03/03/15 2230 03/03/15 2242 03/03/15 2245 03/04/15 0000  BP: 134/87 134/87 140/78   Pulse: 77 78 78 90  Temp:      TempSrc:      Resp: 18 20 19 17   Height:      Weight:      SpO2: 98% 99% 97% 100%     Constitutional: Vital signs reviewed. Patient is a well-developed and well-nourished in no acute distress and cooperative with exam. Alert and oriented x3.  Head: Normocephalic and atraumatic  Ear: TM normal bilaterally  Mouth: no erythema or exudates, MMM  Eyes: PERRL, EOMI, conjunctivae normal, No scleral icterus.  Neck: Supple, Trachea midline normal ROM, No JVD, mass, thyromegaly, or carotid bruit present.  Cardiovascular: Tachycardic with occasional PVCs pulses symmetric and intact bilaterally  Pulmonary/Chest: CTAB, no wheezes, rales, or rhonchi  Abdominal: Soft. Non-tender, mildly distended, bowel sounds are normal, no masses, organomegaly, or guarding present.  GU: no CVA tenderness Musculoskeletal: No joint deformities, erythema, or stiffness, ROM full and no nontender Ext: no edema and no cyanosis, pulses palpable bilaterally (DP and PT)  Neurological: A&O x3, Strenght is normal and symmetric bilaterally, cranial nerve II-XII are grossly intact, no focal motor deficit, sensory intact to light touch bilaterally.  Skin: Warm, dry and intact. No rash, cyanosis, or clubbing.  Psychiatric: Anxious. Cognition and memory are normal.      Data Review   Micro Results No results found for this or any previous visit (from the past 240  hour(s)).  Radiology Reports Ct Abdomen Pelvis Wo Contrast  02/26/2015  CLINICAL DATA:  Chronic mid epigastric abdominal pain, worse after eating. Initial encounter. EXAM: CT ABDOMEN AND PELVIS WITHOUT CONTRAST TECHNIQUE: Multidetector CT  imaging of the abdomen and pelvis was performed following the standard protocol without IV contrast. COMPARISON:  CT of the abdomen and pelvis performed 06/15/2014, and abdominal ultrasound performed 02/25/2015 FINDINGS: Mild bibasilar scarring is noted at the lung bases. Pacemaker leads are partially imaged. Diffuse coronary artery calcifications are seen. Trace pericardial fluid remains within normal limits. The liver and spleen are unremarkable in appearance. The gallbladder is within normal limits. The pancreas and adrenal glands are unremarkable. Scattered bilateral renal cysts are seen, measuring up to 5.4 cm in size. The kidneys are otherwise unremarkable. There is no evidence of hydronephrosis. No renal or ureteral stones are seen. No perinephric stranding is appreciated. No free fluid is identified. The small bowel is unremarkable in appearance. The stomach is within normal limits. No acute vascular abnormalities are seen. Relatively diffuse calcification is seen along the abdominal aorta and its branches. Vascular stents are noted at the proximal renal arteries bilaterally. The appendix is normal in caliber, without evidence of appendicitis. Scattered diverticulosis is noted along the descending and proximal sigmoid colon, without evidence of diverticulitis. The bladder is mildly distended and grossly unremarkable. The uterus is unremarkable in appearance. The ovaries are grossly symmetric. No suspicious adnexal masses are seen. No inguinal lymphadenopathy is seen. No acute osseous abnormalities are identified. Facet disease is noted at the lower lumbar spine. IMPRESSION: 1. No acute abnormality seen to explain the patient's symptoms. 2. Scattered bilateral renal  cysts seen. 3. Relatively diffuse calcification along the abdominal aorta and its branches. 4. Diffuse coronary artery calcifications seen. 5. Scattered diverticulosis along the descending and proximal sigmoid colon, without evidence of diverticulitis. Electronically Signed   By: Garald Balding M.D.   On: 02/26/2015 19:45   Dg Chest 2 View  03/03/2015  CLINICAL DATA:  Central chest pain and shortness of breath for 3 hours. EXAM: CHEST  2 VIEW COMPARISON:  Single AP view 03/01/2015 FINDINGS: Dual lead left-sided pacemaker remains in place. Improving bibasilar aeration from prior exam. Minimal residual right perihilar opacity persists. Cardiomediastinal contours are unchanged. No pleural effusion or pneumothorax. No acute osseous abnormalities. IMPRESSION: Improving bibasilar aeration. Minimal residual right perihilar opacity, may reflect resolving edema versus atelectasis. Exam is otherwise unchanged. Electronically Signed   By: Jeb Levering M.D.   On: 03/03/2015 18:06   Dg Chest 2 View  02/22/2015  CLINICAL DATA:  Shortness of breath today, myocardial infarction 10 days ago EXAM: CHEST  2 VIEW COMPARISON:  02/20/2015 FINDINGS: Mild cardiac enlargement stable. Aortic arch calcifications stable. Two lead cardiac pacer unchanged. Vascular pattern within normal limits. No significant consolidation. Possible trace left pleural effusion. IMPRESSION: No significant acute abnormalities. Electronically Signed   By: Skipper Cliche M.D.   On: 02/22/2015 11:53   Dg Chest 2 View  02/20/2015  CLINICAL DATA:  Chest pain for 1 day.  Dyspnea. EXAM: CHEST  2 VIEW COMPARISON:  06/29/2014. FINDINGS: The cardiac silhouette is mildly enlarged and the aorta remains tortuous and partially calcified. The interstitial markings are mildly prominent with mild progression. Normal vascularity. No pleural fluid. Minimal thoracic spine degenerative changes. Stable left subclavian pacer and AICD leads. IMPRESSION: Interval mild  cardiomegaly and mildly prominent interstitial markings. The interstitial prominence could be due to viral pneumonitis or minimal interstitial pulmonary edema. Electronically Signed   By: Claudie Revering M.D.   On: 02/20/2015 17:44   US Abdomen Complete  02/25/2015  CLINICAL DATA:  Abdominal pain EXAM: ULTRASOUND ABDOMEN COMPLETE COMPARISON:  06/15/2014 abdominal CT.  Renal ultrasound 01/03/2015  FINDINGS: Gallbladder: No gallstones or wall thickening visualized. No sonographic Murphy sign noted. Common bile duct: Diameter: 2 mm Liver: No focal lesion identified. Within normal limits in parenchymal echogenicity. Antegrade flow in the imaged portal venous system. IVC: No abnormality visualized. Pancreas: Visualized portion unremarkable. Spleen: Size and appearance within normal limits. Right Kidney: Length: 10.4 cm. Echogenicity within normal limits. No mass or hydronephrosis visualized. Two renal cysts up to 6 cm Left Kidney: Length: 9 cm with smooth cortex. No hydronephrosis or solid mass. 2 renal cysts up to 18 mm Abdominal aorta: Extensive atherosclerosis.  No aneurysm detected. Other findings: None. IMPRESSION: 1. No explanation for acute abdominal pain. 2. Left renal atrophy, likely from chronic renal arterial stenosis given appearance and left renal artery stenting. Electronically Signed   By: Monte Fantasia M.D.   On: 02/25/2015 14:55   Nm Pulmonary Perf And Vent  02/23/2015  CLINICAL DATA:  Short of breath following cardiac catheterization. Chest pain. EXAM: NUCLEAR MEDICINE VENTILATION - PERFUSION LUNG SCAN TECHNIQUE: Ventilation images were obtained in multiple projections using inhaled aerosol Tc-85m DTPA. Perfusion images were obtained in multiple projections after intravenous injection of Tc-77m MAA. RADIOPHARMACEUTICALS:  31.3 Technetium-11m DTPA aerosol inhalation and 4.1 Technetium-25m MAA IV COMPARISON:  Radiograph 02/22/2015 FINDINGS: Ventilation: No focal ventilation defect. Perfusion: No wedge  shaped peripheral perfusion defects to suggest acute pulmonary embolism. IMPRESSION: No evidence of pulmonary embolism. Electronically Signed   By: Suzy Bouchard M.D.   On: 02/23/2015 14:28   Dg Chest Portable 1 View  03/01/2015  CLINICAL DATA:  64 year old female with shortness of breath and chest pain radiating from the sternum to the left side. Myocardial infarction 2 weeks ago. Former smoker. Initial encounter. EXAM: PORTABLE CHEST 1 VIEW COMPARISON:  02/22/2015 and earlier. FINDINGS: Portable AP upright view at 0846 hours. Increased basilar predominant interstitial opacity since the prior. No pneumothorax or pleural effusion. Mildly lower lung volumes. No consolidation. Stable cardiomegaly and mediastinal contours. Stable left chest cardiac AICD. Visualized tracheal air column is within normal limits. IMPRESSION: Increased basilar predominant interstitial opacity, favor acute interstitial edema. No other acute cardiopulmonary abnormality. Electronically Signed   By: Genevie [name] M.D.   On: 03/01/2015 09:10     CBC  Recent Labs Lab 02/26/15 1402 03/01/15 0838 03/02/15 0524 03/03/15 1716  WBC 14.8* 9.8 8.8 6.5  HGB 10.7* 11.6* 11.4* 11.3*  HCT 32.3* 33.6* 33.9* 33.9*  PLT 278 293 281 249  MCV 93.1 92.6 93.1 92.4  MCH 30.8 32.0 31.3 30.8  MCHC 33.1 34.5 33.6 33.3  RDW 16.1* 16.0* 16.4* 16.3*  LYMPHSABS  --  1.0  --   --   MONOABS  --  0.9  --   --   EOSABS  --  0.1  --   --   BASOSABS  --  0.0  --   --     Chemistries   Recent Labs Lab 02/25/15 0914 02/26/15 1402 02/26/15 1520 02/27/15 0444 02/28/15 0436 03/01/15 0838 03/02/15 0524 03/03/15 1716  NA 132* 134*  --  136 132* 134* 134* 134*  K 4.0 4.1  --  3.8 4.2 4.0 3.7 3.9  CL 100* 98*  --  100* 99* 97* 98* 96*  CO2 22 27  --  28 27 25 29 29   GLUCOSE 190* 162*  --  78 110* 102* 114* 112*  BUN 27* 21*  --  19 21* 28* 30* 24*  CREATININE 2.34* 2.19*  --  2.06* 2.31* 2.53* 2.65* 2.43*  CALCIUM 8.6* 8.7*  --  8.8* 8.7* 8.9  8.7* 8.8*  MG  --  2.4  --  2.6*  --   --   --   --   AST 44*  --  56*  --   --   --   --   --   ALT 33  --  55*  --   --   --   --   --   ALKPHOS 52  --  61  --   --   --   --   --   BILITOT 0.6  --   --   --   --   --   --   --    ------------------------------------------------------------------------------------------------------------------ estimated creatinine clearance is 25 mL/min (by C-G formula based on Cr of 2.43). ------------------------------------------------------------------------------------------------------------------ No results for input(s): HGBA1C in the last 72 hours. ------------------------------------------------------------------------------------------------------------------ No results for input(s): CHOL, HDL, LDLCALC, TRIG, CHOLHDL, LDLDIRECT in the last 72 hours. ------------------------------------------------------------------------------------------------------------------ No results for input(s): TSH, T4TOTAL, T3FREE, THYROIDAB in the last 72 hours.  Invalid input(s): FREET3 ------------------------------------------------------------------------------------------------------------------ No results for input(s): VITAMINB12, FOLATE, FERRITIN, TIBC, IRON, RETICCTPCT in the last 72 hours.  Coagulation profile No results for input(s): INR, PROTIME in the last 168 hours.  No results for input(s): DDIMER in the last 72 hours.  Cardiac Enzymes  Recent Labs Lab 03/01/15 1643 03/01/15 2211 03/02/15 0524  TROPONINI 0.16* 0.19* 0.19*   ------------------------------------------------------------------------------------------------------------------ Invalid input(s): POCBNP   CBG:  Recent Labs Lab 02/28/15 1112 03/01/15 1653 03/01/15 2108 03/02/15 0807 03/02/15 1146  GLUCAP 141* 125* 130* 104* 120*       EKG: Independently reviewed. Showing sinus rhythm   Assessment/Plan  Chest pain/angina at rest: Patient with reports of chest pain at  rest. Troponins trending down from previous hospitalization. Patient appears very anxious and fearful since having the heart attack. -Admit patient to telemetry bed -Trending cardiac enzymes  -Continue Ranexa, isosorbide mononitrate, prn nitroglycerin - Consult cardiology in a.m. for assistance  CAD S/P prior RCA PCI, cath 05/03/2013- med Rx, STEMI 01/2015 - 100% prox PDA, treated medically -Continue aspirin and Plavix  PAF with ischemic cardiomyopathy and congestive heart failure: -Continue isosorbide mononitrate, hydralazine, amiodarone, lasix  HTN (hypertension): Stable -per current home regimen  Cough with hemoptysis: Although patient reports continued hemoptysis H&H remains stable. Suspect that this could be secondary to patient's history of GERD which has caused her cough to persist and irritation of the mucosal lining is leading to streaks of blood which patient is seeing and concerned for. - Continue to monitor - May need further evaluation /further explanation to the patient as it seems this provokes significant anxiety  Anemia: Stable -Check CBC in a.m.    Diabetes mellitus type 2 in obese ([place]) -Discontinued oral hypoglycemic agents of glipizide -CBG q 4 hours with sliding scale insulin  subclinical hypothyroidism: TSH was 17.69 on 12/4 with a FT4 of 0.78 at this point question if this week. Be playing a role in patients chest pain complaints. Repeating ft4  Chronic kidney disease stage IV: Stable. Patient with a creatinine of 2.43 on admission it appears that her baseline creatinine 2.2-2.5   -Continue to monitor  Anxiousness -Continue Xanax   Chronic Gerd with history of hiatal hernia - Continue Protonix and Carafate  hyperlipidemia -Continue atorvastatin    Code Status:   full Family Communication: bedside Disposition Plan: admit   Total time spent 55 minutes.Greater than 50% of this time was spent in counseling, explanation  of diagnosis, planning of  further management, and coordination of care  [facility] Pager [phone]  If 7PM-7AM, please contact night-coverage [email or web address] Password [email or web address] 03/04/2015, 12:21 AM

## 2015-03-05 DIAGNOSIS — E669 Obesity, unspecified: Secondary | ICD-10-CM | POA: Diagnosis not present

## 2015-03-05 DIAGNOSIS — R079 Chest pain, unspecified: Secondary | ICD-10-CM | POA: Diagnosis not present

## 2015-03-05 DIAGNOSIS — E119 Type 2 diabetes mellitus without complications: Secondary | ICD-10-CM | POA: Diagnosis not present

## 2015-03-05 DIAGNOSIS — I2510 Atherosclerotic heart disease of native coronary artery without angina pectoris: Secondary | ICD-10-CM | POA: Diagnosis not present

## 2015-03-05 DIAGNOSIS — F419 Anxiety disorder, unspecified: Secondary | ICD-10-CM | POA: Diagnosis not present

## 2015-03-05 DIAGNOSIS — J209 Acute bronchitis, unspecified: Secondary | ICD-10-CM | POA: Diagnosis present

## 2015-03-05 DIAGNOSIS — J208 Acute bronchitis due to other specified organisms: Secondary | ICD-10-CM | POA: Diagnosis not present

## 2015-03-05 DIAGNOSIS — Z9861 Coronary angioplasty status: Secondary | ICD-10-CM | POA: Diagnosis not present

## 2015-03-05 LAB — GLUCOSE, CAPILLARY
GLUCOSE-CAPILLARY: 102 mg/dL — AB (ref 65–99)
GLUCOSE-CAPILLARY: 88 mg/dL (ref 65–99)
GLUCOSE-CAPILLARY: 155 mg/dL — AB (ref 65–99)
Glucose-Capillary: 135 mg/dL — ABNORMAL HIGH (ref 65–99)
Glucose-Capillary: 151 mg/dL — ABNORMAL HIGH (ref 65–99)
Glucose-Capillary: 90 mg/dL (ref 65–99)

## 2015-03-05 MED ORDER — PNEUMOCOCCAL VAC POLYVALENT 25 MCG/0.5ML IJ INJ
0.5000 mL | INJECTION | INTRAMUSCULAR | Status: DC
  Filled 2015-03-05: qty 0.5

## 2015-03-05 MED ORDER — GUAIFENESIN-DM 100-10 MG/5ML PO SYRP
5.0000 mL | ORAL_SOLUTION | ORAL | Status: DC | PRN
  Administered 2015-03-06: 5 mL via ORAL
  Filled 2015-03-05: qty 5

## 2015-03-05 MED ORDER — INSULIN ASPART 100 UNIT/ML ~~LOC~~ SOLN
0.0000 [IU] | Freq: Three times a day (TID) | SUBCUTANEOUS | Status: DC
  Administered 2015-03-05 – 2015-03-06 (×2): 2 [IU] via SUBCUTANEOUS

## 2015-03-05 MED ORDER — GI COCKTAIL ~~LOC~~
30.0000 mL | Freq: Three times a day (TID) | ORAL | Status: DC | PRN
  Administered 2015-03-05 – 2015-03-06 (×2): 30 mL via ORAL
  Filled 2015-03-05 (×2): qty 30

## 2015-03-05 MED ORDER — TRAMADOL HCL 50 MG PO TABS
50.0000 mg | ORAL_TABLET | Freq: Four times a day (QID) | ORAL | Status: DC | PRN
Start: 2015-03-05 — End: 2015-03-06
  Administered 2015-03-05 – 2015-03-06 (×3): 50 mg via ORAL
  Filled 2015-03-05 (×3): qty 1

## 2015-03-05 MED ORDER — DOXYCYCLINE HYCLATE 100 MG PO TABS
100.0000 mg | ORAL_TABLET | Freq: Two times a day (BID) | ORAL | Status: DC
  Administered 2015-03-05 – 2015-03-06 (×3): 100 mg via ORAL
  Filled 2015-03-05 (×3): qty 1

## 2015-03-05 MED ORDER — RANOLAZINE ER 500 MG PO TB12
1000.0000 mg | ORAL_TABLET | Freq: Two times a day (BID) | ORAL | Status: DC
  Administered 2015-03-05 – 2015-03-06 (×2): 1000 mg via ORAL
  Filled 2015-03-05 (×3): qty 2

## 2015-03-05 NOTE — Care Management Important Message (Signed)
Important Message  Patient Details  Name: [name] Terry MRN: [id number] Date of Birth: 05/12/1950   Medicare Important Message Given:  Yes    Guido Sander, RN 03/05/2015, 4:25 PM

## 2015-03-05 NOTE — Progress Notes (Signed)
[facility] PROGRESS NOTE  [name] [id number] DOB: 1950-06-03 DOA: 03/03/2015 PCP: Bronson Curb, PA-C  Assessment/Plan: Primary problem Chest pain: Atypical. According to nursing staff she has episodes of screaming and crying complaining of pain which subsided with Xanax and tramadol. Cardiology adjusting carvedilol and Ranexa. Does not appear to be GI related. She is coughing, though no longer has hemoptysis. Might be an element of acute bronchitis. Will start doxycycline twice a day and cough suppressants. I suspect anxiety is contributing as well. Active Problems:   HTN (hypertension)   HLD (hyperlipidemia)   AICD (automatic cardioverter/defibrillator) present, dual chamber medtronic   Cardiomyopathy, ischemic-EF 35-40%   CAD S/P prior RCA PCI, cath 05/03/2013- med Rx, STEMI 01/2015 - 100% prox PDA, treated medically   Abnormal TSH   Anxiousness   Angina at rest [facility])   Diabetes mellitus type 2 in obese [facility]), controlled Acute bronchitis: See above Paroxysmal atrial fibrillation Chronic kidney disease is stable, stage IV  Code Status:  full Family Communication:  Patient is lucid Disposition Plan:  Home once improved  Consultants:    Procedures:     Antibiotics:    HPI/Subjective: Nurse reports patient was crying, she got Xanax and tramadol. Patient currently complaining of 5 out of 10 pain which is substernal and radiates under her left breast. She describes it as sharp. Not positional. Denies hemoptysis. Productive cough started a few days ago. Was dyspneic earlier but improved now.  Objective: Filed Vitals:   03/04/15 2003 03/05/15 0506  BP: 110/70 110/69  Pulse: 76 78  Temp: 98.6 F (37 C) 98.2 F (36.8 C)  Resp: 20 20    Intake/Output Summary (Last 24 hours) at 03/05/15 1216 Last data filed at 03/05/15 1145  Gross per 24 hour  Intake    660 ml  Output    200 ml  Net    460 ml   Filed Weights   03/03/15 1704 03/04/15 0121  03/05/15 0506  Weight: 81.647 kg (180 lb) 89.858 kg (198 lb 1.6 oz) 90.357 kg (199 lb 3.2 oz)    Exam:   General:  Appears comfortable. Coughing up clear sputum. Oriented and alert. Noted to be teary  Cardiovascular: Regular rate rhythm without murmurs gallops rubs. No chest wall tenderness to palpation.  Respiratory: Clear to auscultation bilaterally without wheezes rhonchi or rales  Abdomen: Soft nontender nondistended  Ext: no Clubbing cyanosis or edema  Basic Metabolic Panel:  Recent Labs Lab 02/26/15 1402 02/27/15 0444 02/28/15 0436 03/01/15 0838 03/02/15 0524 03/03/15 1716 03/04/15 0414 03/04/15 1353  NA 134* 136 132* 134* 134* 134*  --   --   K 4.1 3.8 4.2 4.0 3.7 3.9  --   --   CL 98* 100* 99* 97* 98* 96*  --   --   CO2 27 28 27 25 29 29   --   --   GLUCOSE 162* 78 110* 102* 114* 112*  --   --   BUN 21* 19 21* 28* 30* 24*  --   --   CREATININE 2.19* 2.06* 2.31* 2.53* 2.65* 2.43* 2.26*  --   CALCIUM 8.7* 8.8* 8.7* 8.9 8.7* 8.8*  --   --   MG 2.4 2.6*  --   --   --   --   --  2.3   Liver Function Tests:  Recent Labs Lab 02/26/15 1520  AST 56*  ALT 55*  ALKPHOS 61    Recent Labs Lab 02/26/15 1520  LIPASE 38  AMYLASE 95   No results for input(s): AMMONIA in the last 168 hours. CBC:  Recent Labs Lab 02/26/15 1402 03/01/15 0838 03/02/15 0524 03/03/15 1716 03/04/15 0414  WBC 14.8* 9.8 8.8 6.5 6.0  NEUTROABS  --  7.7  --   --   --   HGB 10.7* 11.6* 11.4* 11.3* 11.2*  HCT 32.3* 33.6* 33.9* 33.9* 32.7*  MCV 93.1 92.6 93.1 92.4 92.4  PLT 278 293 281 249 240   Cardiac Enzymes:  Recent Labs Lab 03/01/15 0838 03/01/15 1643 03/01/15 2211 03/02/15 0524  TROPONINI 0.22* 0.16* 0.19* 0.19*   BNP (last 3 results)  Recent Labs  02/20/15 1645 02/21/15 1042 03/01/15 0839  BNP 463.9* 386.1* 452.0*    ProBNP (last 3 results) No results for input(s): PROBNP in the last 8760 hours.  CBG:  Recent Labs Lab 03/04/15 2006 03/04/15 2358  03/05/15 0456 03/05/15 0809 03/05/15 1145  GLUCAP 132* 90 102* 88 151*    No results found for this or any previous visit (from the past 240 hour(s)).   Studies: Dg Chest 2 View  03/03/2015  CLINICAL DATA:  Central chest pain and shortness of breath for 3 hours. EXAM: CHEST  2 VIEW COMPARISON:  Single AP view 03/01/2015 FINDINGS: Dual lead left-sided pacemaker remains in place. Improving bibasilar aeration from prior exam. Minimal residual right perihilar opacity persists. Cardiomediastinal contours are unchanged. No pleural effusion or pneumothorax. No acute osseous abnormalities. IMPRESSION: Improving bibasilar aeration. Minimal residual right perihilar opacity, may reflect resolving edema versus atelectasis. Exam is otherwise unchanged. Electronically Signed   By: Jeb Levering M.D.   On: 03/03/2015 18:06    Scheduled Meds: . amiodarone  200 mg Oral BID  . aspirin EC  81 mg Oral Daily  . atorvastatin  40 mg Oral q1800  . carvedilol  9.375 mg Oral BID WC  . clopidogrel  75 mg Oral QHS  . furosemide  40 mg Oral Daily  . heparin  5,000 Units Subcutaneous 3 times per day  . hydrALAZINE  10 mg Oral 3 times per day  . insulin aspart  0-9 Units Subcutaneous 6 times per day  . isosorbide mononitrate  30 mg Oral Daily  . multivitamin with minerals  1 tablet Oral Daily  . pantoprazole  40 mg Oral BID  . [START ON 03/06/2015] pneumococcal 23 valent vaccine  0.5 mL Intramuscular Tomorrow-1000  . ranolazine  1,000 mg Oral BID  . sucralfate  1 g Oral TID WC & HS   Continuous Infusions:   Time spent: 35 minutes  [place] [facility] [email or web address], password [facility] 03/05/2015, 12:16 PM

## 2015-03-05 NOTE — Progress Notes (Signed)
Patient ID: [name] Terry, female   DOB: 1950-11-22, 64 y.o.   MRN: [id number]    Primary cardiologist:  Subjective:    Productive cough, nonspecific chest pain  Objective:   Temp:  [98.1 F (36.7 C)-98.6 F (37 C)] 98.2 F (36.8 C) (12/11 0506) Pulse Rate:  [76-78] 78 (12/11 0506) Resp:  [20] 20 (12/11 0506) BP: (106-110)/(69-70) 110/69 mmHg (12/11 0506) SpO2:  [98 %-100 %] 98 % (12/11 0506) Weight:  [199 lb 3.2 oz (90.357 kg)] 199 lb 3.2 oz (90.357 kg) (12/11 0506)    Filed Weights   03/03/15 1704 03/04/15 0121 03/05/15 0506  Weight: 180 lb (81.647 kg) 198 lb 1.6 oz (89.858 kg) 199 lb 3.2 oz (90.357 kg)    Intake/Output Summary (Last 24 hours) at 03/05/15 1038 Last data filed at 03/05/15 0931  Gross per 24 hour  Intake    660 ml  Output    200 ml  Net    460 ml    Telemetry: afib normal rates  Exam:  General: NAD  Resp: CTAB  Cardiac: irreg, no m/r/g, nojvd  GI: abdomen soft, NT, nD  MSK: no LE edema  Neuro: no focal deficits  Psych: appropriate affect  Lab Results:  Basic Metabolic Panel:  Recent Labs Lab 02/26/15 1402 02/27/15 0444  03/01/15 0838 03/02/15 0524 03/03/15 1716 03/04/15 0414 03/04/15 1353  NA 134* 136  < > 134* 134* 134*  --   --   K 4.1 3.8  < > 4.0 3.7 3.9  --   --   CL 98* 100*  < > 97* 98* 96*  --   --   CO2 27 28  < > 25 29 29   --   --   GLUCOSE 162* 78  < > 102* 114* 112*  --   --   BUN 21* 19  < > 28* 30* 24*  --   --   CREATININE 2.19* 2.06*  < > 2.53* 2.65* 2.43* 2.26*  --   CALCIUM 8.7* 8.8*  < > 8.9 8.7* 8.8*  --   --   MG 2.4 2.6*  --   --   --   --   --  2.3  < > = values in this interval not displayed.  Liver Function Tests:  Recent Labs Lab 02/26/15 1520  AST 56*  ALT 55*  ALKPHOS 61    CBC:  Recent Labs Lab 03/02/15 0524 03/03/15 1716 03/04/15 0414  WBC 8.8 6.5 6.0  HGB 11.4* 11.3* 11.2*  HCT 33.9* 33.9* 32.7*  MCV 93.1 92.4 92.4  PLT 281 249 240    Cardiac Enzymes:  Recent Labs Lab  03/01/15 1643 03/01/15 2211 03/02/15 0524  TROPONINI 0.16* 0.19* 0.19*    BNP: No results for input(s): PROBNP in the last 8760 hours.  Coagulation: No results for input(s): INR in the last 168 hours.  ECG:   Medications:   Scheduled Medications: . amiodarone  200 mg Oral BID  . aspirin EC  81 mg Oral Daily  . atorvastatin  40 mg Oral q1800  . carvedilol  9.375 mg Oral BID WC  . clopidogrel  75 mg Oral QHS  . furosemide  40 mg Oral Daily  . heparin  5,000 Units Subcutaneous 3 times per day  . hydrALAZINE  10 mg Oral 3 times per day  . insulin aspart  0-9 Units Subcutaneous 6 times per day  . isosorbide mononitrate  30 mg Oral Daily  . multivitamin  with minerals  1 tablet Oral Daily  . pantoprazole  40 mg Oral BID  . [START ON 03/06/2015] pneumococcal 23 valent vaccine  0.5 mL Intramuscular Tomorrow-1000  . ranolazine  500 mg Oral BID  . sucralfate  1 g Oral TID WC & HS     Infusions:     PRN Medications:  acetaminophen, ALPRAZolam, morphine injection, nitroGLYCERIN, ondansetron (ZOFRAN) IV, traMADol     Assessment/Plan    1. CAD - fairly complex history, recent STEMI in [place] with occluded PDA that was medically managed, otherwise patent vessels. From notes interventional here has reviewed films and agree lesion is not amenable to revasc - troponin trending down from recent elevation, no new spike. EKG with evidence of old infarct. LVEF fairly stable from study earlier this year, despite infarction did not take a new hit to her LVEF - unclear if ongoing pain is cardiac, no revasc options. Ekg and troponins trend not consistent with new infarct. Continue medical therapy. We increased coreg for antianginal and to help with afib rates. Will increase ranexa to 1000mg  bid.  Possible GI etiology workup per GI and primary team.   2. Chronic sysotolic HF - echo [id number] LVEF 35-40% which is chronic - medical therapy with coreg, hydral/nitrates. No ACE or aldactone  given poor renal function - weights are inaccurate.  - she is on lasix 40mg  daily, I/Os incomplete. Appears euvolemic by exam, continue oral lasix.  - frequent exacerbation may be due to episodes of afib with RVR   3. Afib - on amio and coreg - has not been on anticoag due to being on DAPT and chronic GI symptoms with risk for bleed according to prior notes. With recent hemoptysis would also avoid - chest pain last night with palpitations, she did have some afib with elevated rates, potentially the etiology of her pain - rates improved since increasing her coreg  4. Hemoptysis - per primary team, Hgb stable. Would continue DAPT  5. Hx of VT - prior ablations, remains on amiodarone. Has ICD.        Carlyle Dolly, M.D.

## 2015-03-06 ENCOUNTER — Observation Stay (HOSPITAL_COMMUNITY): Payer: Medicare Other

## 2015-03-06 DIAGNOSIS — I2510 Atherosclerotic heart disease of native coronary artery without angina pectoris: Secondary | ICD-10-CM

## 2015-03-06 DIAGNOSIS — Z9861 Coronary angioplasty status: Secondary | ICD-10-CM

## 2015-03-06 DIAGNOSIS — Z95810 Presence of automatic (implantable) cardiac defibrillator: Secondary | ICD-10-CM | POA: Diagnosis not present

## 2015-03-06 DIAGNOSIS — J209 Acute bronchitis, unspecified: Secondary | ICD-10-CM | POA: Diagnosis not present

## 2015-03-06 DIAGNOSIS — I5043 Acute on chronic combined systolic (congestive) and diastolic (congestive) heart failure: Secondary | ICD-10-CM | POA: Diagnosis present

## 2015-03-06 DIAGNOSIS — J810 Acute pulmonary edema: Secondary | ICD-10-CM | POA: Diagnosis present

## 2015-03-06 DIAGNOSIS — K219 Gastro-esophageal reflux disease without esophagitis: Secondary | ICD-10-CM | POA: Diagnosis present

## 2015-03-06 DIAGNOSIS — J9621 Acute and chronic respiratory failure with hypoxia: Secondary | ICD-10-CM | POA: Diagnosis present

## 2015-03-06 DIAGNOSIS — J9622 Acute and chronic respiratory failure with hypercapnia: Secondary | ICD-10-CM | POA: Diagnosis not present

## 2015-03-06 DIAGNOSIS — R0789 Other chest pain: Secondary | ICD-10-CM | POA: Diagnosis not present

## 2015-03-06 DIAGNOSIS — F419 Anxiety disorder, unspecified: Secondary | ICD-10-CM | POA: Diagnosis not present

## 2015-03-06 DIAGNOSIS — I208 Other forms of angina pectoris: Secondary | ICD-10-CM | POA: Diagnosis not present

## 2015-03-06 DIAGNOSIS — I255 Ischemic cardiomyopathy: Secondary | ICD-10-CM | POA: Diagnosis not present

## 2015-03-06 DIAGNOSIS — I209 Angina pectoris, unspecified: Secondary | ICD-10-CM

## 2015-03-06 LAB — BLOOD GAS, ARTERIAL
ACID-BASE DEFICIT: 0.4 mmol/L (ref 0.0–2.0)
Bicarbonate: 25.6 mEq/L — ABNORMAL HIGH (ref 20.0–24.0)
Drawn by: 364961
O2 CONTENT: 6 L/min
O2 SAT: 64.1 %
PATIENT TEMPERATURE: 98.6
PCO2 ART: 56.8 mmHg — AB (ref 35.0–45.0)
PO2 ART: 42.5 mmHg — AB (ref 80.0–100.0)
TCO2: 27.3 mmol/L (ref 0–100)
pH, Arterial: 7.276 — ABNORMAL LOW (ref 7.350–7.450)

## 2015-03-06 LAB — LACTIC ACID, PLASMA: Lactic Acid, Venous: 1.6 mmol/L (ref 0.5–2.0)

## 2015-03-06 LAB — MAGNESIUM: MAGNESIUM: 2.3 mg/dL (ref 1.7–2.4)

## 2015-03-06 LAB — CBC
HCT: 33.1 % — ABNORMAL LOW (ref 36.0–46.0)
Hemoglobin: 10.8 g/dL — ABNORMAL LOW (ref 12.0–15.0)
MCH: 30.4 pg (ref 26.0–34.0)
MCHC: 32.6 g/dL (ref 30.0–36.0)
MCV: 93.2 fL (ref 78.0–100.0)
PLATELETS: 250 10*3/uL (ref 150–400)
RBC: 3.55 MIL/uL — ABNORMAL LOW (ref 3.87–5.11)
RDW: 16.2 % — AB (ref 11.5–15.5)
WBC: 12.2 10*3/uL — ABNORMAL HIGH (ref 4.0–10.5)

## 2015-03-06 LAB — GLUCOSE, CAPILLARY
GLUCOSE-CAPILLARY: 88 mg/dL (ref 65–99)
GLUCOSE-CAPILLARY: 159 mg/dL — AB (ref 65–99)
GLUCOSE-CAPILLARY: 215 mg/dL — AB (ref 65–99)
Glucose-Capillary: 121 mg/dL — ABNORMAL HIGH (ref 65–99)

## 2015-03-06 LAB — BASIC METABOLIC PANEL
Anion gap: 10 (ref 5–15)
BUN: 26 mg/dL — AB (ref 6–20)
CALCIUM: 8.5 mg/dL — AB (ref 8.9–10.3)
CO2: 25 mmol/L (ref 22–32)
CREATININE: 2.7 mg/dL — AB (ref 0.44–1.00)
Chloride: 95 mmol/L — ABNORMAL LOW (ref 101–111)
GFR calc Af Amer: 20 mL/min — ABNORMAL LOW (ref >60)
GFR, EST NON AFRICAN AMERICAN: 18 mL/min — AB (ref >60)
GLUCOSE: 177 mg/dL — AB (ref 65–99)
Potassium: 5.6 mmol/L — ABNORMAL HIGH (ref 3.5–5.1)
Sodium: 130 mmol/L — ABNORMAL LOW (ref 135–145)

## 2015-03-06 LAB — TROPONIN I: Troponin I: 0.06 ng/mL — ABNORMAL HIGH (ref <0.031)

## 2015-03-06 MED ORDER — ACETAMINOPHEN 650 MG RE SUPP: 650.0000 mg | Freq: Four times a day (QID) | RECTAL | Status: DC | PRN

## 2015-03-06 MED ORDER — FUROSEMIDE 10 MG/ML IJ SOLN
40.0000 mg | Freq: Once | INTRAMUSCULAR | Status: AC
  Administered 2015-03-06: 40 mg via INTRAVENOUS

## 2015-03-06 MED ORDER — FUROSEMIDE 10 MG/ML IJ SOLN
INTRAMUSCULAR | Status: AC
  Filled 2015-03-06: qty 4

## 2015-03-06 MED ORDER — INSULIN ASPART 100 UNIT/ML ~~LOC~~ SOLN: 0.0000 [IU] | SUBCUTANEOUS | Status: DC

## 2015-03-06 MED ORDER — NITROGLYCERIN 2 % TD OINT
0.5000 [in_us] | TOPICAL_OINTMENT | Freq: Four times a day (QID) | TRANSDERMAL | Status: DC
  Administered 2015-03-06 – 2015-03-08 (×6): 0.5 [in_us] via TOPICAL
  Filled 2015-03-06: qty 30

## 2015-03-06 MED ORDER — FUROSEMIDE 10 MG/ML IJ SOLN
20.0000 mg | Freq: Once | INTRAMUSCULAR | Status: AC
  Administered 2015-03-06: 20 mg via INTRAVENOUS
  Filled 2015-03-06: qty 2

## 2015-03-06 MED ORDER — FENTANYL CITRATE (PF) 100 MCG/2ML IJ SOLN
INTRAMUSCULAR | Status: AC
  Filled 2015-03-06: qty 2

## 2015-03-06 MED ORDER — FUROSEMIDE 10 MG/ML IJ SOLN
INTRAMUSCULAR | Status: AC
  Filled 2015-03-06: qty 2

## 2015-03-06 MED ORDER — PANTOPRAZOLE SODIUM 40 MG IV SOLR
40.0000 mg | Freq: Two times a day (BID) | INTRAVENOUS | Status: DC
  Administered 2015-03-06 – 2015-03-07 (×3): 40 mg via INTRAVENOUS
  Filled 2015-03-06 (×3): qty 40

## 2015-03-06 MED ORDER — LEVALBUTEROL HCL 0.63 MG/3ML IN NEBU
0.6300 mg | INHALATION_SOLUTION | Freq: Four times a day (QID) | RESPIRATORY_TRACT | Status: DC | PRN
  Administered 2015-03-06: 0.63 mg via RESPIRATORY_TRACT
  Filled 2015-03-06: qty 3

## 2015-03-06 MED ORDER — BENZONATATE 100 MG PO CAPS: 200.0000 mg | ORAL_CAPSULE | ORAL | Status: DC

## 2015-03-06 MED ORDER — ALPRAZOLAM 0.5 MG PO TABS: 0.5000 mg | ORAL_TABLET | ORAL | Status: AC

## 2015-03-06 MED ORDER — HYDRALAZINE HCL 20 MG/ML IJ SOLN: 5.0000 mg | INTRAMUSCULAR | Status: DC | PRN

## 2015-03-06 MED ORDER — FUROSEMIDE 10 MG/ML IJ SOLN
20.0000 mg | INTRAMUSCULAR | Status: AC
  Administered 2015-03-06: 20 mg via INTRAVENOUS

## 2015-03-06 MED ORDER — ALPRAZOLAM 0.5 MG PO TABS
0.5000 mg | ORAL_TABLET | ORAL | Status: AC
  Administered 2015-03-06: 0.5 mg via ORAL

## 2015-03-06 NOTE — Progress Notes (Signed)
Pt was in generalized and epigastric pain throughout the day and taking care of PRN and Scheduled medicines, Around 6pm, pt had a complain of pain again after tramadol did not help, provided morphine 2mg  IV and Robitussin for coughing, Xanax 0.5mg  for anxiety. Pt was constantly coughing and sounds wheezing, Paged MD for breathing treatment, called Rapid because with all measures pt's pain was not improving, report provided to the night nurse for continuity of care.

## 2015-03-06 NOTE — Progress Notes (Signed)
Shift event: RN had paged this NP after shift change because pt was still c/o pain after Morphine and Tramadol. RN requested neb tx because pt was congested. Neb ordered. Around the same time, RN paged RRRN. When RRRN at bedside, she called this NP due to pt having hypoxia and tachypnea. CXR ordered and NP stat to bedside. Upon arrival, BP 120s, HR 80-90, RR 30s with increased WOB. O2 sat 70-80s, improving some with NRB.  S: pt can not participate in ROS secondary to acute status. Per RN, was pointing to epigastric area with pain.  O: Pt appears acutely ill with increased WOB and moderate respiratory distress. VS as above. Pt is responsive although lethargic. Knows her name. Will follow simple commands. Card: RRR. Lungs: very congested, rhonchi and crackles. MOE x 4. No LE edema noted.  A/P: 1. Suspect flash pulmonary edema due to acuteness of event: Lasix 40mg  IV stat. This was followed by 2 more doses of Lasix 20mg  IV (given in increments to make sure BP was holding). Bipap was started on floor by RT and ABG was poor. PH 7.2, PO2 40s, PCO2 50s. O2 sat normalized on bipap and pt was tolerating well with decreased effort and looked comfortable. ICU bed transfer placed and pt was moved ASAP to [facility]. (PCCM was notified). CXR showed pulmonary edema and official read was pending. Will r/p ABG 2-3 hours after Bipap started.  2. Hx CHF with EF 35% in 11/16. As above. Cardiology was called (have been following). Dr. Meda Coffee informed of acute event and will see pt. Possibly may use Milrinone. Foley, strict I&O.  3. CAD with extensive hx including STEMI 11/16. Medical treatment only now as blockage PDA was not amenable to invasive treatment 11/16.Marland Kitchen Pt on BB, Statin, Imdur. Since NPO now with bipap, started NTG ointment q 6. EKG at bedside without noted ST elevation. Troponins ordered and are pending. Cards to eval.  4. CKD IV-recheck BMP. 5. Afib-non noted on EKG. Pt on amiodorone, but is now NPO. Cardiology is  aware.  6. Hx VT s/p ICK and prior ablations.  7. ? Acute bronchitis per attending note-pt placed on Doxy today, but holding now since NPO. Follow clinically.  8. Hemoptysis-pt had on admission. Tonight, coughed up some pink tinged sputum likely related to pulmonary edema, but holding anticoagulation.  9. VTE-SCDs.  10. Epigastric pain-is on protonix, Carafate. Change PPI to IV. Had EGD this year in [place].  Spoke with PCCM, Dr. Oletta Darter and discussed pt extensively. Since pt now in ICU, PCCM will assume care. Dr. Oletta Darter also informed of cardiology discussion.  Also, spoke to cardiology, Dr. Meda Coffee, and discussed pt. She will come see pt and evaluate for use of Milrinone, CVP measuring. She agreed with nitro ointment and will make further recs in note.  This NP spoke to pt's daughter who will come to hospital later. Informed of bipap, acute CHF and that if bipap is failed, pt may need to be intubated. Daughter stated they would want ventilator and everything done. Pt is a full code. Clance Boll, NP [facility]

## 2015-03-06 NOTE — Progress Notes (Signed)
[facility] PROGRESS NOTE  [name] Terry [id number] DOB: 05-21-1950 DOA: 03/03/2015 PCP: Bronson Curb, PA-C  HPI 64 year old female with a past medical history of ischemic cardiomyopathy, chronic systolic congestive heart failure last EF 35-40% in 01/2015, CKD, DM2, CAD, STEMI in November 2016-treated medically at [facility] with a cardiac catheterization showing 100% stenosis of the proximal PDA; who presents with continued chest pain and reports of coughing up blood. Patient was just admitted on on 12/7 for similar complaints. Patient reports taking all medications as advised but continues to have chest pain. She reported called her cardiologist's office for advice on what to do about her chest pain complaints, but was advised to come into the hospital. Patient reports that she's still intermittently having substernal chest pain that is a 10 out of 10 and sharp. Associated symptoms include, hemoptysis, difficulty breathing, nausea, and anxiety. Patient reports being unable to lie flat as it feels as though she suffocating. Prior to coming in today she states that she tried a nitroglycerin once and was also given 1 by EMS prior to arrival. Nitroglycerin helps relieve pain for a short period in time and then it returns. Patient notes symptoms of hemoptysis first occurred during her ICU stay at [name]. At that time patient noted coughing up clots of blood. It seems these symptoms have now improved some and only report streaks mixed with sputum production. Upon arrival hemoglobin and hematocrit appear to be similar to appears to be patient's baseline.  Assessment/Plan: Primary problem Chest pain: Atypical. Improved today! According to nursing staff she has episodes of screaming and crying complaining of pain which subsided with Xanax and tramadol. Cardiology adjusting carvedilol and Ranexa. Does not appear to be GI related. Got more hx from daughter. Had an EGD  earlier this year in [place]. Already on twice a day PPI and Carafate. She is coughing, though no longer has hemoptysis. Might be an element of acute bronchitis. Started doxycycline twice a day and cough suppressants. I suspect anxiety is contributing as well. Increased activity. Possibly home tomorrow if stable and okay with cardiology. Active Problems:   HTN (hypertension)   HLD (hyperlipidemia)   AICD (automatic cardioverter/defibrillator) present, dual chamber medtronic   Cardiomyopathy, ischemic-EF 35-40% appears compensated   CAD S/P prior RCA PCI, cath 05/03/2013- med Rx, STEMI 01/2015 - 100% prox PDA, treated medically   Abnormal TSH   Anxiousness   Angina at rest [facility])   Diabetes mellitus type 2 in obese [name]), controlled Acute bronchitis: See above Paroxysmal atrial fibrillation: Rate has been good over the past 24 hours. Chronic kidney disease is stable, stage IV stable  Code Status:  full Family Communication:  Daughter by phone 12/11 Disposition Plan:  Home tomorrow if stable and ok with cardiology  Consultants:  [facility]  Procedures:     Antibiotics:  Doxy 12/11 --  HPI/Subjective: Had pain last night, but feels well today. No pain. Ate a good breakfast. Usually walks unassisted but has not been in the hallways yet.  Objective: Filed Vitals:   03/05/15 2020 03/06/15 0528  BP: 123/78 126/70  Pulse: 85 78  Temp: 98.2 F (36.8 C) 97.9 F (36.6 C)  Resp: 20 20    Intake/Output Summary (Last 24 hours) at 03/06/15 1008 Last data filed at 03/06/15 0953  Gross per 24 hour  Intake    540 ml  Output    300 ml  Net    240 ml   [employer]  03/04/15 0121 03/05/15 0506 03/06/15 0528  Weight: 89.858 kg (198 lb 1.6 oz) 90.357 kg (199 lb 3.2 oz) 90.992 kg (200 lb 9.6 oz)    Exam:   General:  Alert, oriented but forgetful. Appears comfortable in chair.  Cardiovascular: Irregularly irregular without murmurs gallops rubs no tenderness to chest wall  palpation  Respiratory: Clear to auscultation bilaterally without wheezes rhonchi or rales  Abdomen: Soft nontender nondistended  Ext: no Clubbing cyanosis or edema  Basic Metabolic Panel:  Recent Labs Lab 02/28/15 0436 03/01/15 0838 03/02/15 0524 03/03/15 1716 03/04/15 0414 03/04/15 1353  NA 132* 134* 134* 134*  --   --   K 4.2 4.0 3.7 3.9  --   --   CL 99* 97* 98* 96*  --   --   CO2 27 25 29 29   --   --   GLUCOSE 110* 102* 114* 112*  --   --   BUN 21* 28* 30* 24*  --   --   CREATININE 2.31* 2.53* 2.65* 2.43* 2.26*  --   CALCIUM 8.7* 8.9 8.7* 8.8*  --   --   MG  --   --   --   --   --  2.3   Liver Function Tests: No results for input(s): AST, ALT, ALKPHOS, BILITOT, PROT, ALBUMIN in the last 168 hours. No results for input(s): LIPASE, AMYLASE in the last 168 hours. No results for input(s): AMMONIA in the last 168 hours. CBC:  Recent Labs Lab 03/01/15 0838 03/02/15 0524 03/03/15 1716 03/04/15 0414  WBC 9.8 8.8 6.5 6.0  NEUTROABS 7.7  --   --   --   HGB 11.6* 11.4* 11.3* 11.2*  HCT 33.6* 33.9* 33.9* 32.7*  MCV 92.6 93.1 92.4 92.4  PLT 293 281 249 240   Cardiac Enzymes:  Recent Labs Lab 03/01/15 0838 03/01/15 1643 03/01/15 2211 03/02/15 0524  TROPONINI 0.22* 0.16* 0.19* 0.19*   BNP (last 3 results)  Recent Labs  02/20/15 1645 02/21/15 1042 03/01/15 0839  BNP 463.9* 386.1* 452.0*    ProBNP (last 3 results) No results for input(s): PROBNP in the last 8760 hours.  CBG:  Recent Labs Lab 03/05/15 0809 03/05/15 1145 03/05/15 1627 03/05/15 2023 03/06/15 0607  GLUCAP 88 151* 155* 135* 88    No results found for this or any previous visit (from the past 240 hour(s)).   Studies: No results found.  Scheduled Meds: . amiodarone  200 mg Oral BID  . aspirin EC  81 mg Oral Daily  . atorvastatin  40 mg Oral q1800  . carvedilol  9.375 mg Oral BID WC  . clopidogrel  75 mg Oral QHS  . doxycycline  100 mg Oral Q12H  . furosemide  40 mg Oral Daily   . heparin  5,000 Units Subcutaneous 3 times per day  . hydrALAZINE  10 mg Oral 3 times per day  . insulin aspart  0-9 Units Subcutaneous TID WC  . isosorbide mononitrate  30 mg Oral Daily  . multivitamin with minerals  1 tablet Oral Daily  . pantoprazole  40 mg Oral BID  . pneumococcal 23 valent vaccine  0.5 mL Intramuscular Tomorrow-1000  . ranolazine  1,000 mg Oral BID  . sucralfate  1 g Oral TID WC & HS   Continuous Infusions:   Time spent: 35 minutes  [place] [facility] [email or web address], password [facility] 03/06/2015, 10:08 AM

## 2015-03-06 NOTE — Progress Notes (Signed)
RT called by 3E secretary @ [phone] to come give patient PRN breathing treatment. Secretary stated RN wanted patient to have this. RT told secretary that I would be there shortly, if patient needed STAT RN may administer. I was in 2MW with another patient.  Secretary called again @ 1949 and explained Response was at bedside and wanted a STAT breathing treatment for patient. I told the secretary to tell them if they could please start treatment but I was on my way. Upon arrival RT found patient in lethargic, diaphoretic state. Rapid response and Forrest Moron @ bedside. Pt was wearing NRB. Sats 78%. RR. 36. BBS: Coarse Crackles throughout. Xopenex aerosol treatment started on patient, no improvement. ABG drawn from R radial artery. ABG given to [place], RT to result. Pt placed on BIPAP of 15/5, RR: 8, FiO2:100%.  ABG results called over: pH: 7.27, cO2: 56.8, pO2: 42.5.  BiPAP settings adjusted to: 18/8, RR: 12, FiO2: 100%.  Report called to Judson Roch, RT in [facility]. Pt going transferring to 2H06.  RT will monitor.

## 2015-03-06 NOTE — Progress Notes (Addendum)
Call received per floor RN at [place] regarding Pt with severe epigastric pain unrelieved with morphine. Per floor RN Pt not in respiratory distress 02 sats 99%, Pt complaining of 10/10 epigastric pain. RN advised to notify [facility] NP while RRT en route. Upon my arrival at [place] found in bed diaphoretic,BP 114/99, Hr 60-70, RR 30s po2 sats 89% on VM 55, lungs sounds bilateral crackles, Pt yelling. Nods head yes to pain in chest and ABD. Pt placed on NRB 100%, with Po2 sats not improved, dropping to 60%. STAT CXR ordered per protocol. [facility] NP Tylene Fantasia paged, call back received and NP updated per myself. Tylene Fantasia to bedside STAT. 80 mg total dose lasix given per order. CBG obtained yielding 215. RT at bedside NEB treatment given and ABG completed STAT. Results provided to NP PH 7.27 CO2 56.8 Po2 42.5 Bicarb 25.6 EKG completed and results provided to NP. BIPAP placed on Pt at 2010 with sats improved to 94% RR 20s. Pt transferred at 2045 to 2H06, care assumed per ICU RN

## 2015-03-06 NOTE — Progress Notes (Signed)
Pt transporteed to 2H06 without event. Sarah , RT is aware that the patient is there

## 2015-03-06 NOTE — Progress Notes (Addendum)
Patient ID: [name] Terry, female   DOB: Mar 21, 1951, 64 y.o.   MRN: [id number]    Primary cardiologist:  Vernie Murders Penn  Subjective:    No chest pain or dyspnea laying flat in chair    Objective:   Temp:  [97.9 F (36.6 C)-98.2 F (36.8 C)] 97.9 F (36.6 C) (12/12 0528) Pulse Rate:  [76-85] 76 (12/12 1025) Resp:  [20] 20 (12/12 0528) BP: (106-132)/(60-78) 106/60 mmHg (12/12 1025) SpO2:  [98 %-100 %] 98 % (12/12 1025) Weight:  [90.992 kg (200 lb 9.6 oz)] 90.992 kg (200 lb 9.6 oz) (12/12 0528)    Filed Weights   03/04/15 0121 03/05/15 0506 03/06/15 0528  Weight: 89.858 kg (198 lb 1.6 oz) 90.357 kg (199 lb 3.2 oz) 90.992 kg (200 lb 9.6 oz)    Intake/Output Summary (Last 24 hours) at 03/06/15 1047 Last data filed at 03/06/15 0953  Gross per 24 hour  Intake    540 ml  Output    300 ml  Net    240 ml    Telemetry  AV pacing   Exam:  General: NAD Black female   Resp: CTAB  Cardiac: irreg, no m/r/g, nojvd pacer under left clavicle   GI: abdomen soft, NT, nD  MSK: no LE edema  Neuro: no focal deficits  Psych: labile   Lab Results:  Basic Metabolic Panel:  Recent Labs Lab 03/01/15 0838 03/02/15 0524 03/03/15 1716 03/04/15 0414 03/04/15 1353  NA 134* 134* 134*  --   --   K 4.0 3.7 3.9  --   --   CL 97* 98* 96*  --   --   CO2 25 29 29   --   --   GLUCOSE 102* 114* 112*  --   --   BUN 28* 30* 24*  --   --   CREATININE 2.53* 2.65* 2.43* 2.26*  --   CALCIUM 8.9 8.7* 8.8*  --   --   MG  --   --   --   --  2.3     CBC:  Recent Labs Lab 03/02/15 0524 03/03/15 1716 03/04/15 0414  WBC 8.8 6.5 6.0  HGB 11.4* 11.3* 11.2*  HCT 33.9* 33.9* 32.7*  MCV 93.1 92.4 92.4  PLT 281 249 240    Cardiac Enzymes:  Recent Labs Lab 03/01/15 1643 03/01/15 2211 03/02/15 0524  TROPONINI 0.16* 0.19* 0.19*     ECG:  SR poor R wave progression old IMI    Medications:   Scheduled Medications: . amiodarone  200 mg Oral BID  . aspirin EC  81 mg Oral Daily    . atorvastatin  40 mg Oral q1800  . carvedilol  9.375 mg Oral BID WC  . clopidogrel  75 mg Oral QHS  . doxycycline  100 mg Oral Q12H  . furosemide  40 mg Oral Daily  . heparin  5,000 Units Subcutaneous 3 times per day  . hydrALAZINE  10 mg Oral 3 times per day  . insulin aspart  0-9 Units Subcutaneous TID WC  . isosorbide mononitrate  30 mg Oral Daily  . multivitamin with minerals  1 tablet Oral Daily  . pantoprazole  40 mg Oral BID  . pneumococcal 23 valent vaccine  0.5 mL Intramuscular Tomorrow-1000  . ranolazine  1,000 mg Oral BID  . sucralfate  1 g Oral TID WC & HS    PRN Medications: acetaminophen, ALPRAZolam, gi cocktail, guaiFENesin-dextromethorphan, morphine injection, nitroGLYCERIN, ondansetron (ZOFRAN) IV, traMADol  Assessment/Plan    1. CAD - fairly complex history, recent STEMI in [place] with occluded PDA that was medically managed, otherwise patent vessels. From notes interventional here has reviewed films and agree lesion is not amenable to revasc  Medical Rx recommended on nitrates and ranolazine   2. Chronic sysotolic HF - echo [id number] LVEF 35-40% which is chronic - medical therapy with coreg, hydral/nitrates. No ACE or aldactone given poor renal function - weights are inaccurate.  - she is on lasix 40mg  daily, I/Os incomplete. Appears euvolemic by exam, continue oral lasix.  - frequent exacerbation may be due to episodes of afib with RVR   3. Afib - on amio and coreg - has not been on anticoag due to being on DAPT and chronic GI symptoms with risk for bleed according to prior notes. With recent hemoptysis would also avoid - chest pain last night with palpitations, she did have some afib with elevated rates, potentially the etiology of her pain - rates improved since increasing her coreg  Outpatient f/u with Dr Rayann Heman who has seen her for VT ablation   4. Hemoptysis - per primary team, Hgb stable. Would continue DAPT  5. Hx of VT - prior  ablations, remains on amiodarone. Has ICD.   Ok to d/c patient in am   Outpatient f/u with Dr Harl Bowie and with Dr Brigid Re

## 2015-03-06 NOTE — Progress Notes (Addendum)
Patient ID: ALIYA KLOPFENSTEIN, female   DOB: May 21, 1950, 64 y.o.   MRN: [id number]    Primary cardiologist:  Vernie Murders Penn  Subjective:    The patient developed acute SOB with respiratory acidosis requiring BiPAP, she has crackles B/L and is clinically in pulmonary edema. She denies chest pain, but has mild epigastric pain.    Objective:   Temp:  [97.4 F (36.3 C)-97.9 F (36.6 C)] 97.4 F (36.3 C) (12/12 1918) Pulse Rate:  [65-109] 84 (12/12 2119) Resp:  [18-35] 19 (12/12 2119) BP: (104-134)/(60-99) 117/74 mmHg (12/12 2119) SpO2:  [65 %-100 %] 99 % (12/12 2119) FiO2 (%):  [100 %] 100 % (12/12 2027) Weight:  [200 lb 9.6 oz (90.992 kg)] 200 lb 9.6 oz (90.992 kg) (12/12 0528)    Filed Weights   03/04/15 0121 03/05/15 0506 03/06/15 0528  Weight: 198 lb 1.6 oz (89.858 kg) 199 lb 3.2 oz (90.357 kg) 200 lb 9.6 oz (90.992 kg)    Intake/Output Summary (Last 24 hours) at 03/06/15 2151 Last data filed at 03/06/15 1811  Gross per 24 hour  Intake    480 ml  Output   1000 ml  Net   -520 ml    Telemetry  AV pacing   Exam:  General: on BiPAP, not in distress  Resp: crackles B/L  Cardiac: irreg, no m/r/g, nojvd pacer under left clavicle   GI: abdomen soft, NT, nD  MSK: no LE edema  Neuro: no focal deficits  Lab Results:  Basic Metabolic Panel:  Recent Labs Lab 03/01/15 0838 03/02/15 0524 03/03/15 1716 03/04/15 0414 03/04/15 1353  NA 134* 134* 134*  --   --   K 4.0 3.7 3.9  --   --   CL 97* 98* 96*  --   --   CO2 25 29 29   --   --   GLUCOSE 102* 114* 112*  --   --   BUN 28* 30* 24*  --   --   CREATININE 2.53* 2.65* 2.43* 2.26*  --   CALCIUM 8.9 8.7* 8.8*  --   --   MG  --   --   --   --  2.3     CBC:  Recent Labs Lab 03/02/15 0524 03/03/15 1716 03/04/15 0414  WBC 8.8 6.5 6.0  HGB 11.4* 11.3* 11.2*  HCT 33.9* 33.9* 32.7*  MCV 93.1 92.4 92.4  PLT 281 249 240   Cardiac Enzymes:  Recent Labs Lab 03/01/15 1643 03/01/15 2211 03/02/15 0524    TROPONINI 0.16* 0.19* 0.19*   ECG:  SR poor R wave progression old IMI   Medications:   Scheduled Medications: . amiodarone  200 mg Oral BID  . aspirin EC  81 mg Oral Daily  . atorvastatin  40 mg Oral q1800  . carvedilol  9.375 mg Oral BID WC  . clopidogrel  75 mg Oral QHS  . furosemide  20 mg Intravenous Once  . insulin aspart  0-9 Units Subcutaneous Q4H  . nitroGLYCERIN  0.5 inch Topical 4 times per day  . pneumococcal 23 valent vaccine  0.5 mL Intramuscular Tomorrow-1000   PRN Medications: acetaminophen, guaiFENesin-dextromethorphan, hydrALAZINE, morphine injection, ondansetron (ZOFRAN) IV    Assessment/Plan    1. Acute respiratory failure with hypoxia and hypercapnia sec to pulmonary edema - I agree with lasix 80 mg iv x 1, she was on 40 mg po daily prior to this event - start NTG paste - Foley was just placed - etiology unknown, ?  Ischemia, troponin was drawn, there is new LBBB on ECG, however her CAD history is very complex, recent STEMI sec to occluded PDA not amenable to intervention, otherwise patent coronaries - I will await troponin results - unless significant elevation treat for acute pulmonary edema  - place central line, consider milrinone  2. CAD - fairly complex history, recent STEMI in [place] with occluded PDA that was medically managed, otherwise patent vessels. From notes interventional here has reviewed films and agree lesion is not amenable to revasc  Medical Rx recommended on nitrates and ranolazine  3. Acute on chronic combined CHF (as above) - echo 01/2015 LVEF 35-40% which is chronic - medical therapy with coreg, hydral/nitrates. No ACE or aldactone given poor renal function  3. Afib - on amio and coreg - has not been on anticoag due to being on DAPT and chronic GI symptoms with risk for bleed according to prior notes. With recent hemoptysis would also avoid - rate controlled - rates improved since increasing her coreg  Outpatient f/u with Dr  Rayann Heman who has seen her for VT ablation   4. Hemoptysis - per primary team, Hgb stable. Would continue DAPT  5. Hx of VT - prior ablations, remains on amiodarone. Has ICD.   Critical care time spent with the patient 110 minutes.  Dorothy Spark 03/06/2015

## 2015-03-07 ENCOUNTER — Observation Stay (HOSPITAL_COMMUNITY): Payer: Medicare Other

## 2015-03-07 ENCOUNTER — Encounter: Payer: Medicare Other | Admitting: Nurse Practitioner

## 2015-03-07 DIAGNOSIS — E1122 Type 2 diabetes mellitus with diabetic chronic kidney disease: Secondary | ICD-10-CM | POA: Diagnosis present

## 2015-03-07 DIAGNOSIS — I25119 Atherosclerotic heart disease of native coronary artery with unspecified angina pectoris: Secondary | ICD-10-CM | POA: Diagnosis present

## 2015-03-07 DIAGNOSIS — I2510 Atherosclerotic heart disease of native coronary artery without angina pectoris: Secondary | ICD-10-CM | POA: Diagnosis not present

## 2015-03-07 DIAGNOSIS — E875 Hyperkalemia: Secondary | ICD-10-CM | POA: Diagnosis not present

## 2015-03-07 DIAGNOSIS — J9621 Acute and chronic respiratory failure with hypoxia: Secondary | ICD-10-CM | POA: Diagnosis not present

## 2015-03-07 DIAGNOSIS — Z87891 Personal history of nicotine dependence: Secondary | ICD-10-CM | POA: Diagnosis not present

## 2015-03-07 DIAGNOSIS — I340 Nonrheumatic mitral (valve) insufficiency: Secondary | ICD-10-CM | POA: Diagnosis present

## 2015-03-07 DIAGNOSIS — Z7902 Long term (current) use of antithrombotics/antiplatelets: Secondary | ICD-10-CM | POA: Diagnosis not present

## 2015-03-07 DIAGNOSIS — Z9221 Personal history of antineoplastic chemotherapy: Secondary | ICD-10-CM | POA: Diagnosis not present

## 2015-03-07 DIAGNOSIS — Z883 Allergy status to other anti-infective agents status: Secondary | ICD-10-CM | POA: Diagnosis not present

## 2015-03-07 DIAGNOSIS — I10 Essential (primary) hypertension: Secondary | ICD-10-CM

## 2015-03-07 DIAGNOSIS — I2119 ST elevation (STEMI) myocardial infarction involving other coronary artery of inferior wall: Secondary | ICD-10-CM | POA: Diagnosis present

## 2015-03-07 DIAGNOSIS — Z95810 Presence of automatic (implantable) cardiac defibrillator: Secondary | ICD-10-CM | POA: Diagnosis not present

## 2015-03-07 DIAGNOSIS — N184 Chronic kidney disease, stage 4 (severe): Secondary | ICD-10-CM | POA: Diagnosis present

## 2015-03-07 DIAGNOSIS — I480 Paroxysmal atrial fibrillation: Secondary | ICD-10-CM | POA: Diagnosis present

## 2015-03-07 DIAGNOSIS — J9622 Acute and chronic respiratory failure with hypercapnia: Secondary | ICD-10-CM | POA: Diagnosis not present

## 2015-03-07 DIAGNOSIS — I481 Persistent atrial fibrillation: Secondary | ICD-10-CM | POA: Diagnosis not present

## 2015-03-07 DIAGNOSIS — I208 Other forms of angina pectoris: Secondary | ICD-10-CM | POA: Diagnosis not present

## 2015-03-07 DIAGNOSIS — K449 Diaphragmatic hernia without obstruction or gangrene: Secondary | ICD-10-CM | POA: Diagnosis present

## 2015-03-07 DIAGNOSIS — E119 Type 2 diabetes mellitus without complications: Secondary | ICD-10-CM | POA: Diagnosis not present

## 2015-03-07 DIAGNOSIS — Z91041 Radiographic dye allergy status: Secondary | ICD-10-CM | POA: Diagnosis not present

## 2015-03-07 DIAGNOSIS — E871 Hypo-osmolality and hyponatremia: Secondary | ICD-10-CM | POA: Diagnosis present

## 2015-03-07 DIAGNOSIS — R0602 Shortness of breath: Secondary | ICD-10-CM | POA: Diagnosis present

## 2015-03-07 DIAGNOSIS — Z882 Allergy status to sulfonamides status: Secondary | ICD-10-CM | POA: Diagnosis not present

## 2015-03-07 DIAGNOSIS — Z8249 Family history of ischemic heart disease and other diseases of the circulatory system: Secondary | ICD-10-CM | POA: Diagnosis not present

## 2015-03-07 DIAGNOSIS — Z8673 Personal history of transient ischemic attack (TIA), and cerebral infarction without residual deficits: Secondary | ICD-10-CM | POA: Diagnosis not present

## 2015-03-07 DIAGNOSIS — I5043 Acute on chronic combined systolic (congestive) and diastolic (congestive) heart failure: Secondary | ICD-10-CM

## 2015-03-07 DIAGNOSIS — J9601 Acute respiratory failure with hypoxia: Secondary | ICD-10-CM | POA: Diagnosis present

## 2015-03-07 DIAGNOSIS — Z7982 Long term (current) use of aspirin: Secondary | ICD-10-CM | POA: Diagnosis not present

## 2015-03-07 DIAGNOSIS — E02 Subclinical iodine-deficiency hypothyroidism: Secondary | ICD-10-CM | POA: Diagnosis present

## 2015-03-07 DIAGNOSIS — E876 Hypokalemia: Secondary | ICD-10-CM | POA: Diagnosis not present

## 2015-03-07 DIAGNOSIS — I255 Ischemic cardiomyopathy: Secondary | ICD-10-CM | POA: Diagnosis not present

## 2015-03-07 DIAGNOSIS — I447 Left bundle-branch block, unspecified: Secondary | ICD-10-CM | POA: Diagnosis present

## 2015-03-07 DIAGNOSIS — Z8572 Personal history of non-Hodgkin lymphomas: Secondary | ICD-10-CM | POA: Diagnosis not present

## 2015-03-07 DIAGNOSIS — J810 Acute pulmonary edema: Secondary | ICD-10-CM | POA: Diagnosis not present

## 2015-03-07 DIAGNOSIS — M353 Polymyalgia rheumatica: Secondary | ICD-10-CM | POA: Diagnosis present

## 2015-03-07 DIAGNOSIS — E669 Obesity, unspecified: Secondary | ICD-10-CM | POA: Diagnosis present

## 2015-03-07 DIAGNOSIS — Z823 Family history of stroke: Secondary | ICD-10-CM | POA: Diagnosis not present

## 2015-03-07 DIAGNOSIS — E785 Hyperlipidemia, unspecified: Secondary | ICD-10-CM | POA: Diagnosis present

## 2015-03-07 DIAGNOSIS — E872 Acidosis: Secondary | ICD-10-CM | POA: Diagnosis present

## 2015-03-07 DIAGNOSIS — J9600 Acute respiratory failure, unspecified whether with hypoxia or hypercapnia: Secondary | ICD-10-CM

## 2015-03-07 DIAGNOSIS — Z6833 Body mass index (BMI) 33.0-33.9, adult: Secondary | ICD-10-CM | POA: Diagnosis not present

## 2015-03-07 DIAGNOSIS — Z955 Presence of coronary angioplasty implant and graft: Secondary | ICD-10-CM | POA: Diagnosis not present

## 2015-03-07 DIAGNOSIS — R7989 Other specified abnormal findings of blood chemistry: Secondary | ICD-10-CM | POA: Diagnosis not present

## 2015-03-07 DIAGNOSIS — J9690 Respiratory failure, unspecified, unspecified whether with hypoxia or hypercapnia: Secondary | ICD-10-CM | POA: Diagnosis present

## 2015-03-07 DIAGNOSIS — Z9861 Coronary angioplasty status: Secondary | ICD-10-CM | POA: Diagnosis not present

## 2015-03-07 DIAGNOSIS — D649 Anemia, unspecified: Secondary | ICD-10-CM | POA: Diagnosis present

## 2015-03-07 DIAGNOSIS — F329 Major depressive disorder, single episode, unspecified: Secondary | ICD-10-CM | POA: Diagnosis present

## 2015-03-07 DIAGNOSIS — I130 Hypertensive heart and chronic kidney disease with heart failure and stage 1 through stage 4 chronic kidney disease, or unspecified chronic kidney disease: Secondary | ICD-10-CM | POA: Diagnosis present

## 2015-03-07 DIAGNOSIS — K210 Gastro-esophageal reflux disease with esophagitis: Secondary | ICD-10-CM | POA: Diagnosis present

## 2015-03-07 DIAGNOSIS — F411 Generalized anxiety disorder: Secondary | ICD-10-CM | POA: Diagnosis present

## 2015-03-07 DIAGNOSIS — Z888 Allergy status to other drugs, medicaments and biological substances status: Secondary | ICD-10-CM | POA: Diagnosis not present

## 2015-03-07 DIAGNOSIS — Z833 Family history of diabetes mellitus: Secondary | ICD-10-CM | POA: Diagnosis not present

## 2015-03-07 DIAGNOSIS — R042 Hemoptysis: Secondary | ICD-10-CM | POA: Diagnosis present

## 2015-03-07 DIAGNOSIS — J209 Acute bronchitis, unspecified: Secondary | ICD-10-CM | POA: Diagnosis present

## 2015-03-07 DIAGNOSIS — Z9114 Patient's other noncompliance with medication regimen: Secondary | ICD-10-CM | POA: Diagnosis not present

## 2015-03-07 DIAGNOSIS — Z923 Personal history of irradiation: Secondary | ICD-10-CM | POA: Diagnosis not present

## 2015-03-07 DIAGNOSIS — Z803 Family history of malignant neoplasm of breast: Secondary | ICD-10-CM | POA: Diagnosis not present

## 2015-03-07 LAB — GLUCOSE, CAPILLARY
GLUCOSE-CAPILLARY: 118 mg/dL — AB (ref 65–99)
GLUCOSE-CAPILLARY: 123 mg/dL — AB (ref 65–99)
GLUCOSE-CAPILLARY: 98 mg/dL (ref 65–99)
Glucose-Capillary: 111 mg/dL — ABNORMAL HIGH (ref 65–99)
Glucose-Capillary: 113 mg/dL — ABNORMAL HIGH (ref 65–99)
Glucose-Capillary: 105 mg/dL — ABNORMAL HIGH (ref 65–99)

## 2015-03-07 LAB — CBC
HEMATOCRIT: 33.6 % — AB (ref 36.0–46.0)
HEMOGLOBIN: 10.9 g/dL — AB (ref 12.0–15.0)
MCH: 30.2 pg (ref 26.0–34.0)
MCHC: 32.4 g/dL (ref 30.0–36.0)
MCV: 93.1 fL (ref 78.0–100.0)
PLATELETS: 247 10*3/uL (ref 150–400)
RBC: 3.61 MIL/uL — AB (ref 3.87–5.11)
RDW: 16.4 % — ABNORMAL HIGH (ref 11.5–15.5)
WBC: 8.9 10*3/uL (ref 4.0–10.5)

## 2015-03-07 LAB — URINALYSIS, ROUTINE W REFLEX MICROSCOPIC
Bilirubin Urine: NEGATIVE
GLUCOSE, UA: NEGATIVE mg/dL
HGB URINE DIPSTICK: NEGATIVE
Ketones, ur: NEGATIVE mg/dL
Leukocytes, UA: NEGATIVE
Nitrite: NEGATIVE
Protein, ur: NEGATIVE mg/dL
SPECIFIC GRAVITY, URINE: 1.008 (ref 1.005–1.030)
pH: 7 (ref 5.0–8.0)

## 2015-03-07 LAB — BASIC METABOLIC PANEL
Anion gap: 9 (ref 5–15)
BUN: 26 mg/dL — AB (ref 6–20)
CALCIUM: 8.6 mg/dL — AB (ref 8.9–10.3)
CHLORIDE: 97 mmol/L — AB (ref 101–111)
CO2: 29 mmol/L (ref 22–32)
CREATININE: 2.38 mg/dL — AB (ref 0.44–1.00)
GFR calc non Af Amer: 21 mL/min — ABNORMAL LOW (ref >60)
GFR, EST AFRICAN AMERICAN: 24 mL/min — AB (ref >60)
Glucose, Bld: 103 mg/dL — ABNORMAL HIGH (ref 65–99)
Potassium: 4.3 mmol/L (ref 3.5–5.1)
SODIUM: 135 mmol/L (ref 135–145)

## 2015-03-07 LAB — CARBOXYHEMOGLOBIN
Carboxyhemoglobin: 1 % (ref 0.5–1.5)
Methemoglobin: 0.8 % (ref 0.0–1.5)
O2 Saturation: 61.3 %
Total hemoglobin: 14.1 g/dL (ref 12.0–16.0)

## 2015-03-07 LAB — PHOSPHORUS: Phosphorus: 4.1 mg/dL (ref 2.5–4.6)

## 2015-03-07 LAB — TROPONIN I
Troponin I: 0.06 ng/mL — ABNORMAL HIGH (ref <0.031)
Troponin I: 0.06 ng/mL — ABNORMAL HIGH (ref <0.031)
Troponin I: 0.06 ng/mL — ABNORMAL HIGH (ref <0.031)

## 2015-03-07 LAB — MAGNESIUM: MAGNESIUM: 1.9 mg/dL (ref 1.7–2.4)

## 2015-03-07 LAB — MRSA PCR SCREENING: MRSA by PCR: NEGATIVE

## 2015-03-07 MED ORDER — CHLORHEXIDINE GLUCONATE 0.12 % MT SOLN: 15.0000 mL | Freq: Two times a day (BID) | OROMUCOSAL | Status: DC

## 2015-03-07 MED ORDER — MAGNESIUM SULFATE 2 GM/50ML IV SOLN
2.0000 g | Freq: Once | INTRAVENOUS | Status: AC
  Administered 2015-03-07: 2 g via INTRAVENOUS
  Filled 2015-03-07: qty 50

## 2015-03-07 MED ORDER — TRAMADOL HCL 50 MG PO TABS
50.0000 mg | ORAL_TABLET | Freq: Two times a day (BID) | ORAL | Status: DC | PRN
  Administered 2015-03-07 – 2015-03-09 (×2): 50 mg via ORAL
  Filled 2015-03-07 (×2): qty 1

## 2015-03-07 MED ORDER — CHLORHEXIDINE GLUCONATE 0.12 % MT SOLN
15.0000 mL | Freq: Two times a day (BID) | OROMUCOSAL | Status: DC
  Administered 2015-03-07 – 2015-03-08 (×4): 15 mL via OROMUCOSAL
  Filled 2015-03-07 (×3): qty 15

## 2015-03-07 MED ORDER — FENTANYL CITRATE (PF) 100 MCG/2ML IJ SOLN
25.0000 ug | Freq: Once | INTRAMUSCULAR | Status: AC
  Administered 2015-03-07: 25 ug via INTRAVENOUS

## 2015-03-07 MED ORDER — INSULIN ASPART 100 UNIT/ML ~~LOC~~ SOLN
0.0000 [IU] | SUBCUTANEOUS | Status: DC
  Administered 2015-03-08 – 2015-03-09 (×2): 2 [IU] via SUBCUTANEOUS
  Administered 2015-03-11: 1 [IU] via SUBCUTANEOUS

## 2015-03-07 MED ORDER — CETYLPYRIDINIUM CHLORIDE 0.05 % MT LIQD
7.0000 mL | Freq: Two times a day (BID) | OROMUCOSAL | Status: DC
  Administered 2015-03-07 – 2015-03-08 (×4): 7 mL via OROMUCOSAL

## 2015-03-07 MED ORDER — FUROSEMIDE 10 MG/ML IJ SOLN
40.0000 mg | Freq: Three times a day (TID) | INTRAMUSCULAR | Status: AC
  Administered 2015-03-07 (×2): 40 mg via INTRAVENOUS
  Filled 2015-03-07 (×2): qty 4

## 2015-03-07 NOTE — Procedures (Signed)
Central Venous Catheter Insertion Procedure Note AYRABELLA VINK [id number] [birth year]  Procedure: Insertion of Central Venous Catheter Indications: Assessment of intravascular volume, Drug and/or fluid administration and Frequent blood sampling  Procedure Details Consent: Risks of procedure as well as the alternatives and risks of each were explained to the (patient/caregiver).  Consent for procedure obtained. Time Out: Verified patient identification, verified procedure, site/side was marked, verified correct patient position, special equipment/implants available, medications/allergies/relevent history reviewed, required imaging and test results available.  Performed  Maximum sterile technique was used including antiseptics, cap, gloves, gown, hand hygiene, mask and sheet. Skin prep: Chlorhexidine; local anesthetic administered A antimicrobial bonded/coated triple lumen catheter was placed in the right internal jugular vein using the Seldinger technique.  Evaluation Blood flow good Complications: No apparent complications Patient did tolerate procedure well. Chest X-ray ordered to verify placement.  CXR: pending.  Procedure performed under direct ultrasound guidance for real time vessel cannulation.      Montey Hora, [place] [facility] Pager: [phone]  or [phone] 03/07/2015, 12:45 AM   Baltazar Apo, MD, PhD 03/07/2015, 2:11 AM [facility] [phone] or if no answer [phone]

## 2015-03-07 NOTE — Progress Notes (Signed)
eLink Physician-Brief Progress Note Patient Name: [name] Terry DOB: 09-Nov-1950 MRN: [id number]   Date of Service  03/07/2015  HPI/Events of Note  Patient c/o pain and she is on Ultram 50 mg PO Q 12 hours PRN at home.   eICU Interventions  Will order Ultram 50 mg PO Q 12 hours PRN pain.      Intervention Category Intermediate Interventions: Pain - evaluation and management  Sommer,Steven Eugene 03/07/2015, 8:56 PM

## 2015-03-07 NOTE — Consult Note (Signed)
PULMONARY / CRITICAL CARE MEDICINE   Name: [name] Terry MRN: [id number] DOB: [birth year]    ADMISSION DATE:  03/03/2015 CONSULTATION DATE:  03/07/15  REFERRING MD:  TRH  CHIEF COMPLAINT:  SOB  HISTORY OF PRESENT ILLNESS:   [name] Terry is a 64 y.o. F with PMH as outlined below including complex cardiac hx including CAD with known PDA not amenable to intervention (recently admitted to [facility] 01/2015 when this was diagnosed).  She was admitted to [facility] 03/03/15 due to chest pain and hemoptysis.  On 12/12, she developed acute SOB.  ABG revealed respiratory acidosis for which she was transferred to the ICU and started on BiPAP.  PCCM called in consultation.  SUBJECTIVE:  Nods her head "no" when asked if she has any chest pain, SOB.  No further hemoptysis.  VITAL SIGNS: BP 124/78 mmHg  Pulse 72  Temp(Src) 98.6 F (37 C) (Oral)  Resp 20  Ht 5\' 5"  (1.651 m)  Wt 92.1 kg (203 lb 0.7 oz)  BMI 33.79 kg/m2  SpO2 100%  HEMODYNAMICS:     VENTILATOR SETTINGS: Vent Mode:  [-] PCV;BIPAP FiO2 (%):  [70 %-100 %] 70 % Set Rate:  [8 bmp] 8 bmp  INTAKE / OUTPUT: I/O last 3 completed shifts: In: 600 [P.O.:600] Out: 1995 [id number]  PHYSICAL EXAMINATION: General: Adult female, in NAD, . Neuro: A&O x 3, non-focal.  HEENT: [place]/AT. PERRL, sclerae anicteric.  BiPAP in place. Cardiovascular: RRR, no M/R/G.  Lungs: Respirations even and unlabored on BiPAP. Abdomen: BS x 4, soft, NT/ND.  Musculoskeletal: No gross deformities, no edema.  Skin: Intact, warm, no rashes.  LABS:  BMET  Recent Labs Lab 03/03/15 1716 03/04/15 0414 03/06/15 2154 03/07/15 0442  NA 134*  --  130* 135  K 3.9  --  5.6* 4.3  CL 96*  --  95* 97*  CO2 29  --  25 29  BUN 24*  --  26* 26*  CREATININE 2.43* 2.26* 2.70* 2.38*  GLUCOSE 112*  --  177* 103*   Electrolytes  Recent Labs Lab 03/03/15 1716 03/04/15 1353 03/06/15 2154 03/07/15 0442  CALCIUM 8.8*  --  8.5* 8.6*  MG  --  2.3 2.3 1.9   PHOS  --   --   --  4.1   CBC  Recent Labs Lab 03/04/15 0414 03/06/15 2154 03/07/15 0442  WBC 6.0 12.2* 8.9  HGB 11.2* 10.8* 10.9*  HCT 32.7* 33.1* 33.6*  PLT 240 250 247   Coag's No results for input(s): APTT, INR in the last 168 hours.  Sepsis Markers  Recent Labs Lab 03/06/15 2012  LATICACIDVEN 1.6   ABG  Recent Labs Lab 03/06/15 2002  PHART 7.276*  PCO2ART 56.8*  PO2ART 42.5*   Liver Enzymes No results for input(s): AST, ALT, ALKPHOS, BILITOT, ALBUMIN in the last 168 hours.  Cardiac Enzymes  Recent Labs Lab 03/02/15 0524 03/06/15 2112 03/07/15 0200  TROPONINI 0.19* 0.06* 0.06*   Glucose  Recent Labs Lab 03/05/15 2023 03/06/15 0607 03/06/15 1118 03/06/15 1631 03/06/15 1955 03/07/15 0148  GLUCAP 135* 88 159* 121* 215* 123*   Imaging Dg Chest Port 1 View  03/07/2015  CLINICAL DATA:  Respiratory failure. EXAM: PORTABLE CHEST 1 VIEW COMPARISON:  03/07/2015. FINDINGS: Right IJ line in stable position. Cardiac pacer with lead tips in right atrium right ventricle. Cardiomegaly with bilateral pulmonary infiltrates consistent with pulmonary edema. Bilateral pneumonia cannot be excluded. No pleural effusion or pneumothorax. IMPRESSION: 1. Right IJ line in stable  position. 2. Cardiac pacer stable position. Persistent prominent cardiomegaly. 3. Persistent unchanged bilateral pulmonary infiltrates consistent with bilateral pulmonary edema and/or pneumonia. Electronically Signed   By: [place]   On: 03/07/2015 07:13   Dg Chest Port 1 View  03/07/2015  CLINICAL DATA:  Central line placement. EXAM: PORTABLE CHEST 1 VIEW COMPARISON:  03/06/2015 FINDINGS: Cardiac pacemaker. Interval placement of a right central venous catheter. Tip overlies the cavoatrial junction. Shallow inspiration. Persistent bilateral perihilar infiltrates, greater on the right. No pneumothorax. No blunting of costophrenic angles. Cardiac enlargement. IMPRESSION: Right central venous  catheter with tip over the cavoatrial junction. Bilateral pulmonary airspace disease. Electronically Signed   By: Lucienne Capers M.D.   On: 03/07/2015 01:26   Dg Chest Port 1 View  03/06/2015  CLINICAL DATA:  Sudden onset shortness of breath with altered mental status tonight. Respiratory distress. EXAM: PORTABLE CHEST 1 VIEW COMPARISON:  03/03/2015 FINDINGS: Cardiac pacemaker. Shallow inspiration. Mild cardiac enlargement. There is bilateral perihilar airspace disease, greater on the right. This may be due to pulmonary edema or pneumonia. No blunting of costophrenic angles. No pneumothorax. Calcified and tortuous aorta. IMPRESSION: Bilateral perihilar airspace disease may indicate pulmonary edema or pneumonia. Electronically Signed   By: Lucienne Capers M.D.   On: 03/06/2015 20:27   STUDIES:  CXR 12/13 > pulmonary edema.  CULTURES: None  ANTIBIOTICS: None  SIGNIFICANT EVENTS: 12/10 > admitted to [facility]. 12/12 > transferred to ICU for BiPAP.  LINES/TUBES: R IJ CVL 12/13 >  ASSESSMENT / PLAN:  PULMONARY A: Acute hypoxic respiratory failure. Pulmonary edema - consistent w systolic CHF exacerbation. Hemoptysis - resolved. Former smoker. P:   Continue BiPAP for comfort. Follow closely for fatigue, worsening. Lasix 40 mg IV q8 x2 doses. CXR in AM.  CARDIOVASCULAR A:  CHF exacerbation - last echo from Nov 2016 with EF 35 - 40%. Hx CAD, ICM, A.fib, VT (prior ablation and now has ICD), HTN, HLD. P:  Cards following. Trend troponins. Continue ASA, plavix, atorvastatin, carvedilol, hydralazine, amio, nitro. F/u co-ox, CVP's. May be a candidate for milrinone  RENAL A:   Hyponatremia - likely hypervolemic. Mild hyperkalemia. AKI. ? Hypocalcemia - no albumin to correct for. P:   Lasix 40 mg IV q8 x2 doses. Replace electrolytes as indicated. BMP in AM.  GASTROINTESTINAL A:   GERD. Nutrition. P:   SUP: Pantoprazole. NPO until able to come off BiPAP.  HEMATOLOGIC A:    Mild anemia - chronic. VTE Prophylaxis. P:  SCD's / ASA / Plavix. CBC in AM.  INFECTIOUS A:   No indication of infection. P:   Monitor clinically.  ENDOCRINE A:   DM2.   P:   SSI.  NEUROLOGIC A:   Hx anxiety / depression, CVA. P:   Continue xanax.  Family updated: Patient updated bedside.  Interdisciplinary Family Meeting v Palliative Care Meeting:  Due by: 12/19.  The patient is critically ill with multiple organ systems failure and requires high complexity decision making for assessment and support, frequent evaluation and titration of therapies, application of advanced monitoring technologies and extensive interpretation of multiple databases.   Critical Care Time devoted to patient care services described in this note is  45  Minutes. This time reflects time of care of this signee Dr Jennet Maduro. This critical care time does not reflect procedure time, or teaching time or supervisory time of PA/NP/Med student/Med Resident etc but could involve care discussion time.  Rush Farmer, M.D. [facility] Pulmonary/Critical Care Medicine. Pager: [phone]. After  hours pager: [phone].

## 2015-03-07 NOTE — Progress Notes (Signed)
Patient trialed off BiPAP onto 2L nasal cannula. Oxygen saturation 99%, respiratory rate 20, no shortness of breath. Dr. Nelda Marseille aware. Orders to keep off BiPAP as long as patient can tolerate and is comfortable. RT made aware. No diet orders given at this time. Will continue to monitor.

## 2015-03-07 NOTE — Progress Notes (Signed)
Patient ID: [name] Terry, female   DOB: [birth year], 64 y.o.   MRN: [id number]    Primary cardiologist:  Vernie Murders Penn  Subjective:    Sedated with bipap on Hemodynamics stable     Objective:   Temp:  [97.2 F (36.2 C)-97.9 F (36.6 C)] 97.8 F (36.6 C) (12/13 0400) Pulse Rate:  [65-109] 70 (12/13 0739) Resp:  [15-35] 18 (12/13 0739) BP: (99-135)/(60-99) 117/79 mmHg (12/13 0600) SpO2:  [65 %-100 %] 100 % (12/13 0739) FiO2 (%):  [70 %-100 %] 70 % (12/13 0739) Weight:  [92.1 kg (203 lb 0.7 oz)-92.3 kg (203 lb 7.8 oz)] 92.1 kg (203 lb 0.7 oz) (12/13 0400)    Filed Weights   03/06/15 0528 03/06/15 2119 03/07/15 0400  Weight: 90.992 kg (200 lb 9.6 oz) 92.3 kg (203 lb 7.8 oz) 92.1 kg (203 lb 0.7 oz)    Intake/Output Summary (Last 24 hours) at 03/07/15 [id number] Last data filed at 03/07/15 0600  Gross per 24 hour  Intake    480 ml  Output   1995 ml  Net  -1515 ml    Telemetry  SR   Exam:  General: Obese  Black female   Resp: Decreased BS base Bipap  Cardiac: S1/S2 MR  pacer under left clavicle   GI: abdomen soft, NT, nD  MSK: no LE edema  Neuro: no focal deficits  Psych: labile   Lab Results:  Basic Metabolic Panel:  Recent Labs Lab 03/03/15 1716 03/04/15 0414 03/04/15 1353 03/06/15 2154 03/07/15 0442  NA 134*  --   --  130* 135  K 3.9  --   --  5.6* 4.3  CL 96*  --   --  95* 97*  CO2 29  --   --  25 29  GLUCOSE 112*  --   --  177* 103*  BUN 24*  --   --  26* 26*  CREATININE 2.43* 2.26*  --  2.70* 2.38*  CALCIUM 8.8*  --   --  8.5* 8.6*  MG  --   --  2.3 2.3 1.9     CBC:  Recent Labs Lab 03/04/15 0414 03/06/15 2154 03/07/15 0442  WBC 6.0 12.2* 8.9  HGB 11.2* 10.8* 10.9*  HCT 32.7* 33.1* 33.6*  MCV 92.4 93.2 93.1  PLT 240 250 247    Cardiac Enzymes:  Recent Labs Lab 03/02/15 0524 03/06/15 2112 03/07/15 0200  TROPONINI 0.19* 0.06* 0.06*     ECG:  SR poor R wave progression old IMI    Medications:   Scheduled  Medications: . amiodarone  200 mg Oral BID  . antiseptic oral rinse  7 mL Mouth Rinse q12n4p  . aspirin EC  81 mg Oral Daily  . atorvastatin  40 mg Oral q1800  . carvedilol  9.375 mg Oral BID WC  . chlorhexidine  15 mL Mouth Rinse BID  . clopidogrel  75 mg Oral QHS  . insulin aspart  0-9 Units Subcutaneous Q4H  . nitroGLYCERIN  0.5 inch Topical 4 times per day  . pantoprazole (PROTONIX) IV  40 mg Intravenous Q12H  . pneumococcal 23 valent vaccine  0.5 mL Intramuscular Tomorrow-1000    PRN Medications: acetaminophen, hydrALAZINE, morphine injection, ondansetron (ZOFRAN) IV     Assessment/Plan    1. CAD - fairly complex history, recent STEMI in [place] with occluded PDA that was medically managed, otherwise patent vessels. From notes interventional here has reviewed films and agree lesion is not amenable to revasc  Medical Rx recommended continue nitrates and beta blocker    2. Chronic sysotolic HF - echo [id number] LVEF 35-40% which is chronic - medical therapy with coreg, hydral/nitrates. No ACE or aldactone given poor renal function - weights are inaccurate.   I think issue leading to decompensation last night may involve ischemic MR.  Reviewed her echo and at baseline MR is moderate  May need TEE To further evaluate if she has recurrent episodes of pulmonary edema  Doubt recurrent ischemia troponins flat CRF does not help with filling pressures And volume management    3. Afib - on amio and coreg - has not been on anticoag due to being on DAPT and chronic GI symptoms with risk for bleed according to prior notes. With recent hemoptysis would also avoid - chest pain last night with palpitations, she did have some afib with elevated rates, potentially the etiology of her pain - rates improved since increasing her coreg  Outpatient f/u with Dr Rayann Heman who has seen her for VT ablation   4. Hemoptysis - per primary team, Hgb stable. Would continue DAPT  5. Hx of VT - prior  ablations, remains on amiodarone. Has ICD.    Outpatient f/u with Dr Harl Bowie and with Dr Brigid Re

## 2015-03-07 NOTE — Progress Notes (Signed)
Received pt in bed having respiratory distress  and complaining of epigastric pain. Outgoing RN just gave IV Morphine as prn order but it did not help her as the pt was screaming from pain . VS taken and 12L  EKG done. Rapid response was called as pt's O2 sat was desaturating to 70's, placed on non- rebreather mask. Tylene Fantasia was notified and ordered for stat CXR and total of 80mg  IV lasix. Seen and examined by K.Kirby and ordered to transfer to ICU. Report given to RN Baxter Flattery. Daughter was informed of the room transfer.

## 2015-03-07 NOTE — Consult Note (Signed)
PULMONARY / CRITICAL CARE MEDICINE   Name: [name] Terry MRN: [id number] DOB: 31-Jan-1951    ADMISSION DATE:  03/03/2015 CONSULTATION DATE:  03/07/15  REFERRING MD:  TRH  CHIEF COMPLAINT:  SOB  HISTORY OF PRESENT ILLNESS:   [name] Terry is a 64 y.o. F with PMH as outlined below including complex cardiac hx including CAD with known PDA not amenable to intervention (recently admitted to [facility] 01/2015 when this was diagnosed).  She was admitted to [facility] 03/03/15 due to chest pain and hemoptysis.  On 12/12, she developed acute SOB.  ABG revealed respiratory acidosis for which she was transferred to the ICU and started on BiPAP.  PCCM called in consultation.  PAST MEDICAL HISTORY :  She  has a past medical history of Ischemic cardiomyopathy; History of stroke ( ); Coronary artery disease; Lymphoma ([place]); Chronic systolic CHF (congestive heart failure) ([place]); HTN (hypertension); HLD (hyperlipidemia); Ventricular tachycardia ([place]); DM2 (diabetes mellitus, type 2), newly diagnosed ( ); CKD (chronic kidney disease) stage 3, GFR 30-59 ml/min ( ); Polymyalgia rheumatica ([place]); Depression; GERD (gastroesophageal reflux disease); Renal atrophy, left; Mitral regurgitation; Diverticulosis; PAF (paroxysmal atrial fibrillation) ([place]); Hiatal hernia; Gastritis; and Anxiousness (03/02/2015).  PAST SURGICAL HISTORY: She  has past surgical history that includes Cardiac defibrillator placement (03/2011); Coronary angioplasty with stent; left and right heart catheterization with coronary angiogram (N/A, 05/03/2013); v-tach ablation (N/A, 05/26/2014); and Cardiac catheterization (N/A, 08/04/2014).  Allergies  Allergen Reactions  . Flagyl [Metronidazole] Swelling and Rash    Face swells   . Contrast Media [Iodinated Diagnostic Agents] Rash  . Ioxaglate Rash  . Potassium Sulfate Rash and Other (See Comments)    Headaches  . Potassium-Containing Compounds Other (See Comments)    Headaches-- reports no  problems now    No current facility-administered medications on file prior to encounter.   Current Outpatient Prescriptions on File Prior to Encounter  Medication Sig  . ALPRAZolam (XANAX) 0.25 MG tablet Take 1-2 tablets (0.25-0.5 mg total) by mouth 3 (three) times daily as needed for anxiety.  Marland Kitchen amiodarone (PACERONE) 200 MG tablet Take 1 tablet (200 mg total) by mouth 2 (two) times daily.  Marland Kitchen aspirin EC 81 MG tablet Take 81 mg by mouth daily.  Marland Kitchen atorvastatin (LIPITOR) 40 MG tablet Take 1 tablet (40 mg total) by mouth daily at 6 PM.  . carvedilol (COREG) 6.25 MG tablet Take 6.25 mg by mouth 2 (two) times daily with a meal.  . clopidogrel (PLAVIX) 75 MG tablet Take 75 mg by mouth at bedtime.   . furosemide (LASIX) 40 MG tablet Take 1 tablet (40 mg total) by mouth daily.  Marland Kitchen glipiZIDE (GLUCOTROL XL) 2.5 MG 24 hr tablet Take 1 tablet (2.5 mg total) by mouth daily with breakfast. DO NOT TAKE THIS MEDICATION FOR YOUR DIABETES IF YOUR BLOOD SUGARS BELOW 120 AND/OR IF YOU'RE NOT EATING WELL. CHECK YOUR BLOOD SUGARS TWICE DAILY.  . hydrALAZINE (APRESOLINE) 10 MG tablet Take 1 tablet (10 mg total) by mouth every 8 (eight) hours.  . isosorbide mononitrate (IMDUR) 30 MG 24 hr tablet Take 1 tablet (30 mg total) by mouth daily.  . Multiple Vitamin (MULTI VITAMIN DAILY PO) Take 1 tablet by mouth daily.  . pantoprazole (PROTONIX) 40 MG tablet Take 1 tablet (40 mg total) by mouth 2 (two) times daily.  . ranolazine (RANEXA) 500 MG 12 hr tablet Take 1 tablet (500 mg total) by mouth 2 (two) times daily.  . sucralfate (CARAFATE) 1 GM/10ML suspension Take  10 mLs (1 g total) by mouth 4 (four) times daily -  with meals and at bedtime.  . traMADol (ULTRAM) 50 MG tablet Take 1 tablet (50 mg total) by mouth every 12 (twelve) hours as needed for moderate pain or severe pain.  . nitroGLYCERIN (NITROSTAT) 0.4 MG SL tablet Place 1 tablet (0.4 mg total) under the tongue every 5 (five) minutes as needed for chest pain. (Patient  not taking: Reported on 03/01/2015)    FAMILY HISTORY:  Her indicated that her mother is deceased. She indicated that her father is deceased. She indicated that all of her five sisters are alive. She indicated that four of her six brothers are alive.   SOCIAL HISTORY: She  reports that she quit smoking about 9 months ago. She has never used smokeless tobacco. She reports that she does not drink alcohol or use illicit drugs.  REVIEW OF SYSTEMS:   Unable to obtain as pt is on BiPAP.  SUBJECTIVE:  Nods her head "no" when asked if she has any chest pain, SOB.  No further hemoptysis.  VITAL SIGNS: BP 118/83 mmHg  Pulse 72  Temp(Src) 97.2 F (36.2 C) (Axillary)  Resp 17  Ht 5\' 5"  (1.651 m)  Wt 92.3 kg (203 lb 7.8 oz)  BMI 33.86 kg/m2  SpO2 100%  HEMODYNAMICS:    VENTILATOR SETTINGS: Vent Mode:  [-]  FiO2 (%):  [100 %] 100 %  INTAKE / OUTPUT: I/O last 3 completed shifts: In: 1020 [P.O.:1020] Out: 1300 [Urine:1300]   PHYSICAL EXAMINATION: General: Adult female, in NAD. Neuro: A&O x 3, non-focal.  HEENT: [place]/AT. PERRL, sclerae anicteric.  BiPAP in place. Cardiovascular: RRR, no M/R/G.  Lungs: Respirations even and unlabored.  Bilateral crackles. Abdomen: BS x 4, soft, NT/ND.  Musculoskeletal: No gross deformities, no edema.  Skin: Intact, warm, no rashes.  LABS:  BMET  Recent Labs Lab 03/02/15 0524 03/03/15 1716 03/04/15 0414 03/06/15 2154  NA 134* 134*  --  130*  K 3.7 3.9  --  5.6*  CL 98* 96*  --  95*  CO2 29 29  --  25  BUN 30* 24*  --  26*  CREATININE 2.65* 2.43* 2.26* 2.70*  GLUCOSE 114* 112*  --  177*    Electrolytes  Recent Labs Lab 03/02/15 0524 03/03/15 1716 03/04/15 1353 03/06/15 2154  CALCIUM 8.7* 8.8*  --  8.5*  MG  --   --  2.3 2.3    CBC  Recent Labs Lab 03/03/15 1716 03/04/15 0414 03/06/15 2154  WBC 6.5 6.0 12.2*  HGB 11.3* 11.2* 10.8*  HCT 33.9* 32.7* 33.1*  PLT 249 240 250    Coag's No results for input(s): APTT, INR  in the last 168 hours.  Sepsis Markers  Recent Labs Lab 03/06/15 2012  LATICACIDVEN 1.6    ABG  Recent Labs Lab 03/06/15 2002  PHART 7.276*  PCO2ART 56.8*  PO2ART 42.5*    Liver Enzymes No results for input(s): AST, ALT, ALKPHOS, BILITOT, ALBUMIN in the last 168 hours.  Cardiac Enzymes  Recent Labs Lab 03/01/15 2211 03/02/15 0524 03/06/15 2112  TROPONINI 0.19* 0.19* 0.06*    Glucose  Recent Labs Lab 03/05/15 1627 03/05/15 2023 03/06/15 0607 03/06/15 1118 03/06/15 1631 03/06/15 1955  GLUCAP 155* 135* 88 159* 121* 215*    Imaging Dg Chest Port 1 View  03/07/2015  CLINICAL DATA:  Central line placement. EXAM: PORTABLE CHEST 1 VIEW COMPARISON:  03/06/2015 FINDINGS: Cardiac pacemaker. Interval placement of a right central  venous catheter. Tip overlies the cavoatrial junction. Shallow inspiration. Persistent bilateral perihilar infiltrates, greater on the right. No pneumothorax. No blunting of costophrenic angles. Cardiac enlargement. IMPRESSION: Right central venous catheter with tip over the cavoatrial junction. Bilateral pulmonary airspace disease. Electronically Signed   By: Lucienne Capers M.D.   On: 03/07/2015 01:26   Dg Chest Port 1 View  03/06/2015  CLINICAL DATA:  Sudden onset shortness of breath with altered mental status tonight. Respiratory distress. EXAM: PORTABLE CHEST 1 VIEW COMPARISON:  03/03/2015 FINDINGS: Cardiac pacemaker. Shallow inspiration. Mild cardiac enlargement. There is bilateral perihilar airspace disease, greater on the right. This may be due to pulmonary edema or pneumonia. No blunting of costophrenic angles. No pneumothorax. Calcified and tortuous aorta. IMPRESSION: Bilateral perihilar airspace disease may indicate pulmonary edema or pneumonia. Electronically Signed   By: Lucienne Capers M.D.   On: 03/06/2015 20:27     STUDIES:  CXR 12/13 > pulmonary edema.  CULTURES: None  ANTIBIOTICS: None  SIGNIFICANT EVENTS: 12/10 >  admitted to [facility]. 12/12 > transferred to ICU.  LINES/TUBES: R IJ CVL 12/13 >  ASSESSMENT / PLAN:  PULMONARY A: Acute hypoxic respiratory failure. Pulmonary edema - consistent w systolic CHF exacerbation. Hemoptysis - resolved. Former smoker. P:   Continue BiPAP as needed. Follow closely for fatigue, worsening. May need MV Continue aggressive diuresis as BP and SCr permit. CXR in AM.  CARDIOVASCULAR A:  CHF exacerbation - last echo from Nov 2016 with EF 35 - 40%. Hx CAD, ICM, A.fib, VT (prior ablation and now has ICD), HTN, HLD. P:  Cards following. Trend troponins. Continue ASA, plavix, atorvastatin, carvedilol, hydralazine, amio, nitro. F/u co-ox, CVP's. May be a candidate for milrinone  RENAL A:   Hyponatremia - likely hypervolemic. Mild hyperkalemia. AKI. ? Hypocalcemia - no albumin to correct for. P:   Assess ionized calcium. BMP in AM.  GASTROINTESTINAL A:   GERD. Nutrition. P:   SUP: Pantoprazole. NPO.  HEMATOLOGIC A:   Mild anemia - chronic. VTE Prophylaxis. P:  SCD's / ASA / Plavix. CBC in AM.  INFECTIOUS A:   No indication of infection. P:   Monitor clinically.  ENDOCRINE A:   DM2.   P:   SSI.  NEUROLOGIC A:   Hx anxiety / depression, CVA. P:   Continue xanax.   Family updated: None.  Interdisciplinary Family Meeting v Palliative Care Meeting:  Due by: 12/19.   Montey Hora, [place] [facility] Pager: [phone]  or [phone] 03/07/2015, 1:35 AM   Attending Note:  I have examined patient, reviewed labs, studies and notes. I have discussed the case with Jun[place] Roads, and I agree with the data and plans as amended above. Pt with acute B infiltrates in setting recent MI and systolic dysfxn, consistent with acute systolic CHF. On my eval she is tolerating NIPPV, appears to be stabilizing. Our goal will be to temporize w BiPAP, diurese and control BP. ? whether she might be a candidate for  milrinone. If she tires or declines then we will intubate. Independent critical care time is 40 minutes.   Baltazar Apo, MD, PhD 03/07/2015, 2:12 AM [place] [facility] [phone] or if no answer [phone]

## 2015-03-08 ENCOUNTER — Inpatient Hospital Stay (HOSPITAL_COMMUNITY): Payer: Medicare Other

## 2015-03-08 DIAGNOSIS — J9601 Acute respiratory failure with hypoxia: Secondary | ICD-10-CM | POA: Diagnosis present

## 2015-03-08 LAB — BASIC METABOLIC PANEL
ANION GAP: 7 (ref 5–15)
BUN: 27 mg/dL — ABNORMAL HIGH (ref 6–20)
CHLORIDE: 96 mmol/L — AB (ref 101–111)
CO2: 30 mmol/L (ref 22–32)
Calcium: 8.4 mg/dL — ABNORMAL LOW (ref 8.9–10.3)
Creatinine, Ser: 2.38 mg/dL — ABNORMAL HIGH (ref 0.44–1.00)
GFR calc non Af Amer: 21 mL/min — ABNORMAL LOW (ref >60)
GFR, EST AFRICAN AMERICAN: 24 mL/min — AB (ref >60)
Glucose, Bld: 87 mg/dL (ref 65–99)
Potassium: 3.5 mmol/L (ref 3.5–5.1)
Sodium: 133 mmol/L — ABNORMAL LOW (ref 135–145)

## 2015-03-08 LAB — GLUCOSE, CAPILLARY
GLUCOSE-CAPILLARY: 84 mg/dL (ref 65–99)
GLUCOSE-CAPILLARY: 69 mg/dL (ref 65–99)
GLUCOSE-CAPILLARY: 159 mg/dL — AB (ref 65–99)
GLUCOSE-CAPILLARY: 118 mg/dL — AB (ref 65–99)
Glucose-Capillary: 111 mg/dL — ABNORMAL HIGH (ref 65–99)
Glucose-Capillary: 104 mg/dL — ABNORMAL HIGH (ref 65–99)

## 2015-03-08 LAB — CBC
HEMATOCRIT: 28.7 % — AB (ref 36.0–46.0)
HEMOGLOBIN: 9.3 g/dL — AB (ref 12.0–15.0)
MCH: 30.2 pg (ref 26.0–34.0)
MCHC: 32.4 g/dL (ref 30.0–36.0)
MCV: 93.2 fL (ref 78.0–100.0)
Platelets: 197 10*3/uL (ref 150–400)
RBC: 3.08 MIL/uL — ABNORMAL LOW (ref 3.87–5.11)
RDW: 16.4 % — ABNORMAL HIGH (ref 11.5–15.5)
WBC: 5.5 10*3/uL (ref 4.0–10.5)

## 2015-03-08 LAB — PHOSPHORUS: PHOSPHORUS: 3.6 mg/dL (ref 2.5–4.6)

## 2015-03-08 LAB — AMYLASE: Amylase: 64 U/L (ref 28–100)

## 2015-03-08 LAB — MAGNESIUM: Magnesium: 2.2 mg/dL (ref 1.7–2.4)

## 2015-03-08 LAB — LIPASE, BLOOD: Lipase: 31 U/L (ref 11–51)

## 2015-03-08 LAB — TROPONIN I
Troponin I: 0.06 ng/mL — ABNORMAL HIGH (ref <0.031)
Troponin I: 0.05 ng/mL — ABNORMAL HIGH (ref <0.031)

## 2015-03-08 LAB — CALCIUM, IONIZED: Calcium, Ionized, Serum: 4.6 mg/dL (ref 4.5–5.6)

## 2015-03-08 MED ORDER — DEXTROSE-NACL 5-0.9 % IV SOLN
INTRAVENOUS | Status: DC
  Administered 2015-03-08: 22:00:00 via INTRAVENOUS

## 2015-03-08 MED ORDER — PROMETHAZINE HCL 25 MG/ML IJ SOLN
12.5000 mg | Freq: Four times a day (QID) | INTRAMUSCULAR | Status: DC | PRN
  Administered 2015-03-08 – 2015-03-09 (×2): 12.5 mg via INTRAVENOUS
  Filled 2015-03-08 (×2): qty 1

## 2015-03-08 MED ORDER — POTASSIUM CHLORIDE ER 10 MEQ PO TBCR
20.0000 meq | EXTENDED_RELEASE_TABLET | Freq: Once | ORAL | Status: AC
  Administered 2015-03-08: 20 meq via ORAL
  Filled 2015-03-08 (×2): qty 2

## 2015-03-08 MED ORDER — PANTOPRAZOLE SODIUM 40 MG PO TBEC
40.0000 mg | DELAYED_RELEASE_TABLET | Freq: Two times a day (BID) | ORAL | Status: DC
  Administered 2015-03-08 – 2015-03-09 (×3): 40 mg via ORAL
  Filled 2015-03-08 (×3): qty 1

## 2015-03-08 MED ORDER — HEPARIN BOLUS VIA INFUSION
3000.0000 [IU] | Freq: Once | INTRAVENOUS | Status: DC
  Filled 2015-03-08: qty 3000

## 2015-03-08 MED ORDER — ALUM & MAG HYDROXIDE-SIMETH 200-200-20 MG/5ML PO SUSP
30.0000 mL | Freq: Once | ORAL | Status: AC
  Administered 2015-03-08: 30 mL via ORAL
  Filled 2015-03-08: qty 30

## 2015-03-08 MED ORDER — POTASSIUM CHLORIDE CRYS ER 20 MEQ PO TBCR: 40.0000 meq | EXTENDED_RELEASE_TABLET | Freq: Three times a day (TID) | ORAL | Status: DC

## 2015-03-08 MED ORDER — ISOSORBIDE DINITRATE 10 MG PO TABS
10.0000 mg | ORAL_TABLET | Freq: Three times a day (TID) | ORAL | Status: DC
  Administered 2015-03-08 – 2015-03-09 (×4): 10 mg via ORAL
  Filled 2015-03-08 (×5): qty 1

## 2015-03-08 MED ORDER — HEPARIN (PORCINE) IN NACL 100-0.45 UNIT/ML-% IJ SOLN: 1150.0000 [IU]/h | INTRAMUSCULAR | Status: DC

## 2015-03-08 NOTE — Progress Notes (Signed)
eLink Physician-Brief Progress Note Patient Name: [name] Terry DOB: 1950/08/08 MRN: [id number]   Date of Service  03/08/2015  HPI/Events of Note  EKG - NSR, Incomplete LBBB, Non specific T wave abnormality and Prolonged QT interval (QTc = 502). EKG not c/w myocardial injury, however, LBBB complicates interpretation.   eICU Interventions  Await Troponin results.     Intervention Category Intermediate Interventions: Diagnostic test evaluation  Lysle Dingwall 03/08/2015, 4:23 PM

## 2015-03-08 NOTE — Progress Notes (Signed)
eLink Physician-Brief Progress Note Patient Name: [name] Terry DOB: 03-04-1951 MRN: [id number]   Date of Service  03/08/2015  HPI/Events of Note  Patient still c/o abdominal pain and N/V.   eICU Interventions  Will order: 1. NPO. 2. D5 0.9 NaCl to run IV at 75 mL/hour.  3. Amylase and lipase now.  4. Portable KUB post NGT placement. 5. Phenergan 12.5 mg IV Q 6 hours PRN N/V.     Intervention Category Intermediate Interventions: Pain - evaluation and management  [name] Terry 03/08/2015, 7:17 PM

## 2015-03-08 NOTE — Progress Notes (Signed)
Pt continues to complain of abdominal pain. Md aware of cardiac related results. New orders obtained. Will continue to monitor.

## 2015-03-08 NOTE — Progress Notes (Signed)
PULMONARY / CRITICAL CARE MEDICINE   Name: [name] Terry MRN: [id number] DOB: 30-Nov-1950    ADMISSION DATE:  03/03/2015 CONSULTATION DATE:  03/07/15  REFERRING MD:  TRH  CHIEF COMPLAINT:  SOB  HISTORY OF PRESENT ILLNESS:   [name] Terry is a 64 y.o. F with PMH as outlined below including complex cardiac hx including CAD with known PDA not amenable to intervention (recently admitted to [facility] 01/2015 when this was diagnosed).  She was admitted to [facility] 03/03/15 due to chest pain and hemoptysis.  On 12/12, she developed acute SOB.  ABG revealed respiratory acidosis for which she was transferred to the ICU and started on BiPAP.  PCCM called in consultation.  SUBJECTIVE:  On [place] O2, nocturnal BiPAP.  VITAL SIGNS: BP 110/66 mmHg  Pulse 72  Temp(Src) 97.7 F (36.5 C) (Axillary)  Resp 23  Ht 5\' 5"  (1.651 m)  Wt 90 kg (198 lb 6.6 oz)  BMI 33.02 kg/m2  SpO2 97%  HEMODYNAMICS: CVP:  [4 mmHg-8 mmHg] 4 mmHg   VENTILATOR SETTINGS: Vent Mode:  [-] BIPAP FiO2 (%):  [70 %] 70 % Set Rate:  [12 bmp] 12 bmp  INTAKE / OUTPUT: I/O last 3 completed shifts: In: 350 [P.O.:300; IV Piggyback:50] Out: 2970 [Urine:2970]  PHYSICAL EXAMINATION: General: Adult female, in NAD, . Neuro: A&O x 3, non-focal.  HEENT: [place]/AT. PERRL, sclerae anicteric.  BiPAP in place. Cardiovascular: RRR, no M/R/G.  Lungs: Respirations even and unlabored on BiPAP. Abdomen: BS x 4, soft, NT/ND.  Musculoskeletal: No gross deformities, no edema.  Skin: Intact, warm, no rashes.  LABS:  BMET  Recent Labs Lab 03/06/15 2154 03/07/15 0442 03/08/15 0452  NA 130* 135 133*  K 5.6* 4.3 3.5  CL 95* 97* 96*  CO2 25 29 30   BUN 26* 26* 27*  CREATININE 2.70* 2.38* 2.38*  GLUCOSE 177* 103* 87   Electrolytes  Recent Labs Lab 03/06/15 2154 03/07/15 0442 03/08/15 0452  CALCIUM 8.5* 8.6* 8.4*  MG 2.3 1.9 2.2  PHOS  --  4.1 3.6   CBC  Recent Labs Lab 03/06/15 2154 03/07/15 0442 03/08/15 0452  WBC  12.2* 8.9 5.5  HGB 10.8* 10.9* 9.3*  HCT 33.1* 33.6* 28.7*  PLT 250 247 197   Coag's No results for input(s): APTT, INR in the last 168 hours.  Sepsis Markers  Recent Labs Lab 03/06/15 2012  LATICACIDVEN 1.6   ABG  Recent Labs Lab 03/06/15 2002  PHART 7.276*  PCO2ART 56.8*  PO2ART 42.5*   Liver Enzymes No results for input(s): AST, ALT, ALKPHOS, BILITOT, ALBUMIN in the last 168 hours.  Cardiac Enzymes  Recent Labs Lab 03/07/15 0200 03/07/15 1445 03/07/15 2232  TROPONINI 0.06* 0.06* 0.06*   Glucose  Recent Labs Lab 03/07/15 1635 03/07/15 1949 03/07/15 2330 03/08/15 0357 03/08/15 0842 03/08/15 1000  GLUCAP 113* 105* 98 84 69 111*   Imaging Dg Chest Port 1 View  03/08/2015  CLINICAL DATA:  Respiratory failure. EXAM: PORTABLE CHEST 1 VIEW COMPARISON:  03/07/2015. FINDINGS: Right IJ line in stable position. Cardiac pacer noted with lead tips in right atrium and right ventricle. Stable cardiomegaly. Interim slight clearing of bilateral pulmonary alveolar infiltrates/edema. No pleural effusion or pneumothorax. IMPRESSION: 1. Right IJ line stable position. 2. Cardiac pacer stable position. Stable cardiomegaly . Interim slight clearing of bilateral pulmonary infiltrates/edema. Electronically Signed   By: Marcello Moores  Register   On: 03/08/2015 07:12   STUDIES:  CXR 12/13 > pulmonary edema.  CULTURES: None  ANTIBIOTICS:  None  SIGNIFICANT EVENTS: 12/10 > admitted to [facility]. 12/12 > transferred to ICU for BiPAP.  LINES/TUBES: R IJ CVL 12/13 >  I reviewed CXR myself, R IJ in place, no other acute changes.  ASSESSMENT / PLAN:  PULMONARY A: Acute hypoxic respiratory failure. Pulmonary edema - consistent w systolic CHF exacerbation. Hemoptysis - resolved. Former smoker. P:   Change BiPAP to PRN. Follow closely for fatigue, worsening. Hold further lasix. CXR in AM.  CARDIOVASCULAR A:  CHF exacerbation - last echo from Nov 2016 with EF 35 - 40%. Hx CAD,  ICM, A.fib, VT (prior ablation and now has ICD), HTN, HLD. P:  Cards following. Continue ASA, plavix, atorvastatin, carvedilol, hydralazine, amio, nitro. F/u co-ox, CVP's. May be a candidate for milrinone  RENAL A:   Hyponatremia - likely hypervolemic. Mild hyperkalemia. AKI. ? Hypocalcemia - no albumin to correct for. P:   Hold further diureses. Replace electrolytes as indicated. BMP in AM.  GASTROINTESTINAL A:   GERD. Nutrition. P:   SUP: Pantoprazole. Start diet.  HEMATOLOGIC A:   Mild anemia - chronic. VTE Prophylaxis. P:  SCD's / ASA / Plavix. CBC in AM.  INFECTIOUS A:   No indication of infection. P:   Monitor clinically.  ENDOCRINE A:   DM2.   P:   SSI.  NEUROLOGIC A:   Hx anxiety / depression, CVA. P:   Continue xanax.  Family updated: Patient updated bedside.  Interdisciplinary Family Meeting v Palliative Care Meeting:  Due by: 12/19.  Discussed with TRH-MD, transfer to SDU and to [facility] with PCCM off 12/15.  Rush Farmer, M.D. [facility] Pulmonary/Critical Care Medicine. Pager: [phone]. After hours pager: [phone].

## 2015-03-08 NOTE — Progress Notes (Signed)
Pt complains of abdominal pain. Zofran given. MD notified of continued discomfort. New order obtained.

## 2015-03-08 NOTE — Progress Notes (Signed)
Pt. Wanted to go on bipap for rest.

## 2015-03-08 NOTE — Progress Notes (Addendum)
eLink Physician-Brief Progress Note Patient Name: [name] Terry DOB: 1950/12/13 MRN: [id number]   Date of Service  03/08/2015  HPI/Events of Note  Epigastric pain - had epigastric pain associated with flash pulmonary edema which suggests a cardiac ischemic process on admission. LVEF estimated at 35% to 40%.  eICU Interventions  Will order:  1. 12 Lead EKG now.  2. Cycle Troponin.     Intervention Category Intermediate Interventions: Pain - evaluation and management  Sommer,Steven Eugene 03/08/2015, 4:05 PM

## 2015-03-08 NOTE — Progress Notes (Signed)
Patient ID: [name] Terry, female   DOB: 1950-08-26, 64 y.o.   MRN: [id number]    Primary cardiologist:  Vernie Murders [place]  Subjective:    More alert Less dyspnic Of Bipap    Objective:   Temp:  [98.1 F (36.7 C)-99.5 F (37.5 C)] 99.1 F (37.3 C) (12/14 0400) Pulse Rate:  [65-78] 69 (12/14 0700) Resp:  [13-34] 23 (12/14 0700) BP: (97-126)/(58-85) 112/70 mmHg (12/14 0700) SpO2:  [95 %-100 %] 96 % (12/14 0700) FiO2 (%):  [70 %] 70 % (12/14 0338) Weight:  [90 kg (198 lb 6.6 oz)] 90 kg (198 lb 6.6 oz) (12/14 0440)    Filed Weights   03/06/15 2119 03/07/15 0400 03/08/15 0440  Weight: 92.3 kg (203 lb 7.8 oz) 92.1 kg (203 lb 0.7 oz) 90 kg (198 lb 6.6 oz)    Intake/Output Summary (Last 24 hours) at 03/08/15 0805 Last data filed at 03/08/15 0600  Gross per 24 hour  Intake    350 ml  Output   1875 ml  Net  -1525 ml    Telemetry  SR   Exam:  General: Obese  Black female   Resp: Decreased BS base Bipap  Cardiac: S1/S2 MR  pacer under left clavicle   GI: abdomen soft, NT, nD  MSK: no LE edema  Neuro: no focal deficits  Psych: labile   Lab Results:  Basic Metabolic Panel:  Recent Labs Lab 03/06/15 2154 03/07/15 0442 03/08/15 0452  NA 130* 135 133*  K 5.6* 4.3 3.5  CL 95* 97* 96*  CO2 25 29 30   GLUCOSE 177* 103* 87  BUN 26* 26* 27*  CREATININE 2.70* 2.38* 2.38*  CALCIUM 8.5* 8.6* 8.4*  MG 2.3 1.9 2.2     CBC:  Recent Labs Lab 03/06/15 2154 03/07/15 0442 03/08/15 0452  WBC 12.2* 8.9 5.5  HGB 10.8* 10.9* 9.3*  HCT 33.1* 33.6* 28.7*  MCV 93.2 93.1 93.2  PLT 250 247 197    Cardiac Enzymes:  Recent Labs Lab 03/07/15 0200 03/07/15 1445 03/07/15 2232  TROPONINI 0.06* 0.06* 0.06*     ECG:  SR poor R wave progression old IMI    Medications:   Scheduled Medications: . amiodarone  200 mg Oral BID  . antiseptic oral rinse  7 mL Mouth Rinse q12n4p  . aspirin EC  81 mg Oral Daily  . atorvastatin  40 mg Oral q1800  . carvedilol  9.375 mg  Oral BID WC  . chlorhexidine  15 mL Mouth Rinse BID  . clopidogrel  75 mg Oral QHS  . insulin aspart  0-9 Units Subcutaneous Q4H  . nitroGLYCERIN  0.5 inch Topical 4 times per day  . pantoprazole (PROTONIX) IV  40 mg Intravenous Q12H  . pneumococcal 23 valent vaccine  0.5 mL Intramuscular Tomorrow-1000    PRN Medications: acetaminophen, hydrALAZINE, ondansetron (ZOFRAN) IV, traMADol     Assessment/Plan    1. CAD - fairly complex history, recent STEMI in [place] with occluded PDA that was medically managed, otherwise patent vessels. From notes interventional here has reviewed films and agree lesion is not amenable to revasc  Medical Rx recommended add back nitrates    2. Chronic sysotolic HF - echo [id number] LVEF 35-40% which is chronic - medical therapy with coreg, hydral/nitrates. No ACE or aldactone given poor renal function - weights are inaccurate.   I think issue leading to decompensation last night may involve ischemic MR.  Reviewed her echo and at baseline MR is moderate  May need TEE To further evaluate if she has recurrent episodes of pulmonary edema  Doubt recurrent ischemia troponins flat CRF does not help with filling pressures And volume management   Milrinone would not be helpful overall EF only moderately reduced.    Advance diet to Carb modified  OOB     3. Afib - on amio and coreg - has not been on anticoag due to being on DAPT and chronic GI symptoms with risk for bleed according to prior notes. With recent hemoptysis would also avoid - chest pain last night with palpitations, she did have some afib with elevated rates, potentially the etiology of her pain - rates improved since increasing her coreg  Outpatient f/u with Dr Rayann Heman who has seen her for VT ablation   4. Hemoptysis - per primary team, Hgb stable. Would continue DAPT  5. Hx of VT - prior ablations, remains on amiodarone. Has ICD.    Outpatient f/u with Dr Harl Bowie and with Dr Brigid Re

## 2015-03-08 NOTE — Progress Notes (Signed)
ANTICOAGULATION CONSULT NOTE - Initial Consult  Pharmacy Consult for Heparin Indication: chest pain/ACS  / afib  Allergies  Allergen Reactions  . Flagyl [Metronidazole] Swelling and Rash    Face swells   . Contrast Media [Iodinated Diagnostic Agents] Rash  . Ioxaglate Rash  . Potassium Sulfate Rash and Other (See Comments)    Headaches  . Potassium-Containing Compounds Other (See Comments)    Headaches-- reports no problems now    Patient Measurements: Height: 5\' 5"  (165.1 cm) Weight: 198 lb 6.6 oz (90 kg) IBW/kg (Calculated) : 57  Vital Signs: Temp: 98.4 F (36.9 C) (12/14 1600) Temp Source: Oral (12/14 1600) BP: 112/65 mmHg (12/14 1600) Pulse Rate: 71 (12/14 1600)  Labs:  Recent Labs  03/06/15 2154  03/07/15 0442 03/07/15 1445 03/07/15 2232 03/08/15 0452 03/08/15 1615  HGB 10.8*  --  10.9*  --   --  9.3*  --   HCT 33.1*  --  33.6*  --   --  28.7*  --   PLT 250  --  247  --   --  197  --   CREATININE 2.70*  --  2.38*  --   --  2.38*  --   TROPONINI  --   < >  --  0.06* 0.06*  --  0.06*  < > = values in this interval not displayed.  Estimated Creatinine Clearance: 26.8 mL/min (by C-G formula based on Cr of 2.38).   Medical History: Past Medical History  Diagnosis Date  . Ischemic cardiomyopathy     a. s/p MDT dual chamber ICD implanted 2013 by Dr Westley Gambles at [facility], now followed by Dr Rayann Heman. b. LVEF 30-35% by echo 01/2015 at [facility] per report; 35-40% by echo 01/2015 at [facility].  Marland Kitchen History of stroke      a. 1993  . Coronary artery disease     a. s/p previous interventions in [place] per patient, no records available. b. cath 04/2013 with non-obstructive disease. c. STEMI 01/2015 @ [place] - cardiac cath had shown 100% stenosis of prox PDA, patent stent in RCA with otherwise normal coronary arteries.  . Lymphoma ([place])     a. s/p chemo/radiation 2009  . Chronic systolic CHF (congestive heart failure) ([place])   . HTN (hypertension)   . HLD (hyperlipidemia)   .  Ventricular tachycardia ([place])     a. CL 300 msec requiring ICD shocks therpay 5/14 b. recurrent VT 04/2014, placed on amiodarone c. recurrent VT 05/2014 s/p ablation by Dr Rayann Heman d. s/p repeat ablation 07/2014  . DM2 (diabetes mellitus, type 2), newly diagnosed    . CKD (chronic kidney disease) stage 3, GFR 30-59 ml/min    . Polymyalgia rheumatica ([place])   . Depression   . GERD (gastroesophageal reflux disease)   . Renal atrophy, left     a. [place] 02/2015: "Left renal atrophy, likely from chronic renal arterial stenosis"  . Mitral regurgitation     a. mod by echo 01/2015.  . Diverticulosis     a. By CT 02/2015.  Marland Kitchen PAF (paroxysmal atrial fibrillation) ([place])     a. Brief paroxysms during admission 02/2015 - amiodarone increased. Not placed on anticoag due to dual antiplatelet therapy and bleeding risk including in the setting of possible GI pathology.   . Hiatal hernia     a. by EGD 06/2014.  Marland Kitchen Gastritis     a. by EGD 06/2014.  Marland Kitchen Anxiousness 03/02/2015    Assessment: 64 year old female to begin heparin  for recent epigastric pain and elevated cardiac enzymes PMH history includes CAD, CHF and Afib (not on anticoag due to recent hemoptysis and GI symptoms)  Goal of Therapy:  Heparin level 0.3-0.7 units/ml Monitor platelets by anticoagulation protocol: Yes   Plan:  Heparin 3000 units iv bolus x 1 Heparin drip at 1150 units / hr Heparin level 6 hours after heparin begins Daily heparin level, CBC  Thank you. Anette Guarneri, PharmD [phone]   Tad Moore 03/08/2015,6:16 PM

## 2015-03-08 NOTE — Progress Notes (Signed)
eLink Physician-Brief Progress Note Patient Name: [name] Terry DOB: July 03, 1950 MRN: [id number]   Date of Service  03/08/2015  HPI/Events of Note    Recent Labs Lab 03/07/15 0200 03/07/15 1445 03/07/15 2232 03/08/15 1615 03/08/15 2211  TROPONINI 0.06* 0.06* 0.06* 0.06* 0.05*   Earlier eMD oprdered IV heparin gtt for possible CAD pain but per night bedside RN - at 7pm sign out was told by prior shift RN that that RN spoke to Dr Johnsie Cancel and was advised based ona bove not to start IV  Heparin gtt. So IV heparin gtt not started  eICU Interventions  Dc heparin gtt order     Intervention Category Intermediate Interventions: Other:  [name] 03/08/2015, 11:51 PM

## 2015-03-08 NOTE — Progress Notes (Signed)
Foley order received, foley huddle called. Peri care done prior to insertion. Foley placed by myself and Baxter Flattery, Therapist, sports. Sterile technique used throughout procedure. Patient tolerated procedure well. Will continue to monitor.

## 2015-03-08 NOTE — Progress Notes (Signed)
eLink Physician-Brief Progress Note Patient Name: [name] Terry DOB: April 02, 1950 MRN: [id number]   Date of Service  03/08/2015  HPI/Events of Note  Epigastric Pain - Troponin = 0.06. Complex CAD Hx followed by cardiology. Currently on Isordil, Coreg, ASA and Plavix.   eICU Interventions  Will order: 1. Mylanta - 30 mL PO X 1. Doubt this will help, however, will not hurt.  2. Heparin IV infusion per pharmacy.  3. Bedside nurse asked to inform cardiology consultant of events.      Intervention Category Major Interventions: Other:  Lysle Dingwall 03/08/2015, 6:11 PM

## 2015-03-09 DIAGNOSIS — R042 Hemoptysis: Secondary | ICD-10-CM | POA: Diagnosis present

## 2015-03-09 DIAGNOSIS — N184 Chronic kidney disease, stage 4 (severe): Secondary | ICD-10-CM

## 2015-03-09 DIAGNOSIS — R110 Nausea: Secondary | ICD-10-CM

## 2015-03-09 DIAGNOSIS — F411 Generalized anxiety disorder: Secondary | ICD-10-CM

## 2015-03-09 DIAGNOSIS — E038 Other specified hypothyroidism: Secondary | ICD-10-CM | POA: Diagnosis present

## 2015-03-09 DIAGNOSIS — K920 Hematemesis: Secondary | ICD-10-CM | POA: Diagnosis present

## 2015-03-09 DIAGNOSIS — R112 Nausea with vomiting, unspecified: Secondary | ICD-10-CM | POA: Diagnosis present

## 2015-03-09 DIAGNOSIS — I480 Paroxysmal atrial fibrillation: Secondary | ICD-10-CM | POA: Diagnosis present

## 2015-03-09 DIAGNOSIS — R7989 Other specified abnormal findings of blood chemistry: Secondary | ICD-10-CM

## 2015-03-09 DIAGNOSIS — I10 Essential (primary) hypertension: Secondary | ICD-10-CM | POA: Diagnosis present

## 2015-03-09 LAB — CBC WITH DIFFERENTIAL/PLATELET
BASOS ABS: 0 10*3/uL (ref 0.0–0.1)
Basophils Relative: 0 %
Eosinophils Absolute: 0.2 10*3/uL (ref 0.0–0.7)
Eosinophils Relative: 4 %
HEMATOCRIT: 28.5 % — AB (ref 36.0–46.0)
Hemoglobin: 9.4 g/dL — ABNORMAL LOW (ref 12.0–15.0)
LYMPHS ABS: 0.7 10*3/uL (ref 0.7–4.0)
LYMPHS PCT: 12 %
MCH: 30.5 pg (ref 26.0–34.0)
MCHC: 33 g/dL (ref 30.0–36.0)
MCV: 92.5 fL (ref 78.0–100.0)
MONO ABS: 0.5 10*3/uL (ref 0.1–1.0)
Monocytes Relative: 9 %
NEUTROS ABS: 4.3 10*3/uL (ref 1.7–7.7)
Neutrophils Relative %: 75 %
Platelets: 198 10*3/uL (ref 150–400)
RBC: 3.08 MIL/uL — AB (ref 3.87–5.11)
RDW: 16.1 % — ABNORMAL HIGH (ref 11.5–15.5)
WBC: 5.7 10*3/uL (ref 4.0–10.5)

## 2015-03-09 LAB — BASIC METABOLIC PANEL
ANION GAP: 7 (ref 5–15)
BUN: 26 mg/dL — AB (ref 6–20)
CHLORIDE: 97 mmol/L — AB (ref 101–111)
CO2: 29 mmol/L (ref 22–32)
Calcium: 8.4 mg/dL — ABNORMAL LOW (ref 8.9–10.3)
Creatinine, Ser: 2.25 mg/dL — ABNORMAL HIGH (ref 0.44–1.00)
GFR calc non Af Amer: 22 mL/min — ABNORMAL LOW (ref >60)
GFR, EST AFRICAN AMERICAN: 26 mL/min — AB (ref >60)
Glucose, Bld: 127 mg/dL — ABNORMAL HIGH (ref 65–99)
POTASSIUM: 3.6 mmol/L (ref 3.5–5.1)
SODIUM: 133 mmol/L — AB (ref 135–145)

## 2015-03-09 LAB — GLUCOSE, CAPILLARY
GLUCOSE-CAPILLARY: 118 mg/dL — AB (ref 65–99)
GLUCOSE-CAPILLARY: 115 mg/dL — AB (ref 65–99)
GLUCOSE-CAPILLARY: 110 mg/dL — AB (ref 65–99)
GLUCOSE-CAPILLARY: 168 mg/dL — AB (ref 65–99)
Glucose-Capillary: 106 mg/dL — ABNORMAL HIGH (ref 65–99)
Glucose-Capillary: 95 mg/dL (ref 65–99)

## 2015-03-09 LAB — TROPONIN I: Troponin I: 0.05 ng/mL — ABNORMAL HIGH (ref <0.031)

## 2015-03-09 LAB — MAGNESIUM: MAGNESIUM: 2.1 mg/dL (ref 1.7–2.4)

## 2015-03-09 LAB — PHOSPHORUS: PHOSPHORUS: 3.2 mg/dL (ref 2.5–4.6)

## 2015-03-09 MED ORDER — FUROSEMIDE 10 MG/ML IJ SOLN
20.0000 mg | Freq: Every day | INTRAMUSCULAR | Status: DC
  Administered 2015-03-09 – 2015-03-11 (×3): 20 mg via INTRAVENOUS
  Filled 2015-03-09 (×4): qty 2

## 2015-03-09 MED ORDER — BARIUM SULFATE 2.1 % PO SUSP
ORAL | Status: AC
  Administered 2015-03-09: 1 mL
  Filled 2015-03-09: qty 2

## 2015-03-09 MED ORDER — BARIUM SULFATE 2.1 % PO SUSP
ORAL | Status: AC
  Filled 2015-03-09: qty 2

## 2015-03-09 MED ORDER — PROMETHAZINE HCL 25 MG/ML IJ SOLN
25.0000 mg | Freq: Four times a day (QID) | INTRAMUSCULAR | Status: DC | PRN
  Administered 2015-03-09: 25 mg via INTRAVENOUS
  Filled 2015-03-09: qty 1

## 2015-03-09 MED ORDER — LEVOTHYROXINE SODIUM 100 MCG IV SOLR
25.0000 ug | Freq: Every day | INTRAVENOUS | Status: DC
  Administered 2015-03-09 – 2015-03-11 (×3): 25 ug via INTRAVENOUS
  Filled 2015-03-09 (×3): qty 5

## 2015-03-09 MED ORDER — NITROGLYCERIN 0.4 MG SL SUBL: 0.4000 mg | SUBLINGUAL_TABLET | SUBLINGUAL | Status: DC | PRN

## 2015-03-09 MED ORDER — CETYLPYRIDINIUM CHLORIDE 0.05 % MT LIQD
7.0000 mL | Freq: Two times a day (BID) | OROMUCOSAL | Status: DC
  Administered 2015-03-10 – 2015-03-12 (×5): 7 mL via OROMUCOSAL

## 2015-03-09 MED ORDER — AMIODARONE HCL IN DEXTROSE 360-4.14 MG/200ML-% IV SOLN
30.0000 mg/h | INTRAVENOUS | Status: DC
  Administered 2015-03-09 – 2015-03-11 (×4): 30 mg/h via INTRAVENOUS
  Filled 2015-03-09 (×4): qty 200

## 2015-03-09 MED ORDER — LORAZEPAM 2 MG/ML IJ SOLN
1.0000 mg | Freq: Three times a day (TID) | INTRAMUSCULAR | Status: DC | PRN
  Administered 2015-03-09 – 2015-03-11 (×4): 1 mg via INTRAVENOUS
  Filled 2015-03-09 (×4): qty 1

## 2015-03-09 MED ORDER — METOPROLOL TARTRATE 1 MG/ML IV SOLN
5.0000 mg | Freq: Four times a day (QID) | INTRAVENOUS | Status: DC
  Administered 2015-03-09 – 2015-03-11 (×8): 5 mg via INTRAVENOUS
  Filled 2015-03-09 (×8): qty 5

## 2015-03-09 MED ORDER — AMIODARONE HCL IN DEXTROSE 360-4.14 MG/200ML-% IV SOLN
60.0000 mg/h | INTRAVENOUS | Status: AC
  Administered 2015-03-09: 60 mg/h via INTRAVENOUS
  Filled 2015-03-09: qty 200

## 2015-03-09 MED ORDER — METOCLOPRAMIDE HCL 5 MG/ML IJ SOLN
10.0000 mg | Freq: Three times a day (TID) | INTRAMUSCULAR | Status: DC
  Administered 2015-03-09 – 2015-03-11 (×7): 10 mg via INTRAVENOUS
  Filled 2015-03-09 (×7): qty 2

## 2015-03-09 MED ORDER — TORSEMIDE 20 MG PO TABS
20.0000 mg | ORAL_TABLET | Freq: Every day | ORAL | Status: DC
  Administered 2015-03-09: 20 mg via ORAL
  Filled 2015-03-09: qty 1

## 2015-03-09 MED ORDER — PANTOPRAZOLE SODIUM 40 MG IV SOLR
40.0000 mg | Freq: Two times a day (BID) | INTRAVENOUS | Status: DC
  Administered 2015-03-09 – 2015-03-11 (×4): 40 mg via INTRAVENOUS
  Filled 2015-03-09 (×4): qty 40

## 2015-03-09 NOTE — Progress Notes (Signed)
[facility] TEAM 1 - Stepdown/ICU TEAM Progress Note  [name] Terry [id number] DOB: May 19, 1950 DOA: 03/03/2015 PCP: Bronson Curb, PA-C  Admit HPI / Brief Narrative: 64 yo BF PMHx ischemic Cardiomyopathy, Cchronic Systolic CHF last EF [id number] in 01/2015, CKD, DM Type 2, CAD, STEMI in November 2016-treated medically at [facility] with a cardiac catheterization showing 100% stenosis of the proximal PDA;   Presents with continued chest pain and reports of coughing up blood. Patient was just admitted on on 12/7 for similar complaints. Patient reports taking all medications as advised but continues to have chest pain. She reported called her cardiologist's office for advice on what to do about her chest pain complaints, but was advised to come into the hospital. Patient reports that she's still intermittently having substernal chest pain that is a 10 out of 10 and sharp. Associated symptoms include, hemoptysis, difficulty breathing, nausea, and anxiety. Patient reports being unable to lie flat as it feels as though she suffocating. Prior to coming in today she states that she tried a nitroglycerin once and was also given 1 by EMS prior to arrival. Nitroglycerin helps relieve pain for a short period in time and then it returns. Patient notes symptoms of hemoptysis first occurred during her ICU stay at [facility]. At that time patient noted coughing up clots of blood. It seems these symptoms have now improved some and only report streaks mixed with sputum production. Upon arrival hemoglobin and hematocrit appear to be similar to appears to be patient's baseline.  HPI/Subjective: 12/15 A/O x 4, (+) Hematemesis prior to admission. Positive Postprandial N/V, positive Abd pain. Positive SOB      Assessment/Plan: Chest pain/angina at rest:  Patient with reports of chest pain at rest. Troponins trending down from previous hospitalization. Patient appears very anxious and fearful  since having the heart attack. -Admit patient to telemetry bed -Trending cardiac enzymes  -Discontinue isosorbide mononitrate, prn nitroglycerin  CAD in native artery - S/P prior RCA PCI, cath 05/03/2013- med Rx, STEMI 01/2015 - 100% prox PDA, treated medically -Per cardiology note continue medical management no intervention warranted -Continue aspirin and Plavix  PAF with ischemic cardiomyopathy and congestive heart failure: -Patient is having postprandial N/V possible SBO/ileus unsure of absorption or monitor medication patient actually is keeping her system PO. -DC PO amiodarone restart IV amiodarone  -DC Coreg start metoprolol IV 5 mg QID -DC Demadex, start Lasix IV 20 mg daily -DC isosorbide dinitrate, PRN NTG 0.4 mg    HTN (hypertension):  -See paroxysmal atrial fibrillation  Cough with Hemoptysis/Hematemesis:  -Although patient reports continued hemoptysis H&H remains stable. Suspect that this could be secondary to patient's history of GERD which has caused her cough to persist and irritation of the mucosal lining is leading to streaks of blood which patient is seeing. - Continue to monitor -See abdominal pain  Abdominal pain/Nausea/Vomiting  -NPO -R/O SBO/ileus; CT Abd/Pelvis PO contrast -See Subclinical Hypothyroid -Phenergen IV 25 mg QID] -Reglan IV 10 mg TID  Chronic Gerd with history of hiatal hernia - Continue Protonix and Carafate -If no SBO/ileus consult GI for EGD  Anemia:  -Stable -Check CBC in a.m.   Diabetes mellitus type 2 in obese ([place]) -Discontinued oral hypoglycemic agents of glipizide -Sensitive SSI  Subclinical hypothyroidism:  -TSH was 17.69 on 12/4 with a FT4 of 0.78 at this point question if this week. Be playing a role in patients chest pain complaints and Abdominal Pain/ N/V . -Synthroid IV 25 mcg  Chronic kidney disease stage IV: ( baseline creatinine 2.2-2.5) -Stable. Patient with a creatinine of 2.43 on admission   -Continue to  monitor  Anxiety -On admission patient Xanax abruptly stopped -Start Ativan 1 mg TID PRN    HLD -Continue atorvastatin   Code Status: FULL Family Communication: no family present at time of exam Disposition Plan: Resolution Hematemesis    Consultants: Dr.Peter Tommy [place] cardiology  Procedure/Significant Events:    Culture NA  Antibiotics: NA  DVT prophylaxis: SCD   Devices    LINES / TUBES:      Continuous Infusions: . amiodarone 60 mg/hr (03/09/15 1750)  . amiodarone      Objective: VITAL SIGNS: Temp: 97.6 F (36.4 C) (12/15 1958) Temp Source: Oral (12/15 1958) BP: 133/82 mmHg (12/15 1958) Pulse Rate: 63 (12/15 1958) SPO2; FIO2:   Intake/Output Summary (Last 24 hours) at 03/09/15 2117 Last data filed at 03/09/15 1600  Gross per 24 hour  Intake 756.25 ml  Output   1375 ml  Net -618.75 ml     Exam: General: A/O x 4 , C/O Abd Pain LUQ, No acute respiratory distress Eyes: Negative headache, eye pain, double vision,negative scleral hemorrhage ENT: Negative Runny nose, negative ear pain, negative gingival bleeding, Neck:  Negative scars, masses, torticollis, lymphadenopathy, JVD Lungs: Clear to auscultation bilaterally without wheezes or crackles Cardiovascular: Irregular irregular rhythm and rate, without murmur gallop or rub normal S1 and S2 Abdomen: Positive abdominal pain to palpation epigastric/LUQ, nondistended, positive soft, bowel sounds, no rebound, no ascites, no appreciable mass Extremities: No significant cyanosis, clubbing, or edema bilateral lower extremities Psychiatric:  Negative depression, positive anxiety, negative fatigue, positive mania Neurologic:  Cranial nerves II through XII intact, tongue/uvula midline, all extremities muscle strength 5/5, sensation intact throughout, negative dysarthria, negative expressive aphasia, negative receptive aphasia.   Data Reviewed: Basic Metabolic Panel:  Recent Labs Lab 03/03/15 1716  03/04/15 0414 03/04/15 1353 03/06/15 2154 03/07/15 0442 03/08/15 0452 03/09/15 0444  NA 134*  --   --  130* 135 133* 133*  K 3.9  --   --  5.6* 4.3 3.5 3.6  CL 96*  --   --  95* 97* 96* 97*  CO2 29  --   --  25 29 30 29   GLUCOSE 112*  --   --  177* 103* 87 127*  BUN 24*  --   --  26* 26* 27* 26*  CREATININE 2.43* 2.26*  --  2.70* 2.38* 2.38* 2.25*  CALCIUM 8.8*  --   --  8.5* 8.6* 8.4* 8.4*  MG  --   --  2.3 2.3 1.9 2.2 2.1  PHOS  --   --   --   --  4.1 3.6 3.2   Liver Function Tests: No results for input(s): AST, ALT, ALKPHOS, BILITOT, PROT, ALBUMIN in the last 168 hours.  Recent Labs Lab 03/08/15 2211  LIPASE 31  AMYLASE 64   No results for input(s): AMMONIA in the last 168 hours. CBC:  Recent Labs Lab 03/04/15 0414 03/06/15 2154 03/07/15 0442 03/08/15 0452 03/09/15 0930  WBC 6.0 12.2* 8.9 5.5 5.7  NEUTROABS  --   --   --   --  4.3  HGB 11.2* 10.8* 10.9* 9.3* 9.4*  HCT 32.7* 33.1* 33.6* 28.7* 28.5*  MCV 92.4 93.2 93.1 93.2 92.5  PLT 240 250 247 197 198   Cardiac Enzymes:  Recent Labs Lab 03/07/15 1445 03/07/15 2232 03/08/15 1615 03/08/15 2211 03/09/15 0444  TROPONINI 0.06* 0.06*  0.06* 0.05* 0.05*   BNP (last 3 results)  Recent Labs  02/20/15 1645 02/21/15 1042 03/01/15 0839  BNP 463.9* 386.1* 452.0*    ProBNP (last 3 results) No results for input(s): PROBNP in the last 8760 hours.  CBG:  Recent Labs Lab 03/09/15 0437 03/09/15 0749 03/09/15 1122 03/09/15 1559 03/09/15 2004  GLUCAP 115* 106* 110* 168* 95    Recent Results (from the past 240 hour(s))  MRSA PCR Screening     Status: None   Collection Time: 03/07/15  2:25 AM  Result Value Ref Range Status   MRSA by PCR NEGATIVE NEGATIVE Final    Comment:        The GeneXpert MRSA Assay (FDA approved for NASAL specimens only), is one component of a comprehensive MRSA colonization surveillance program. It is not intended to diagnose MRSA infection nor to guide or monitor  treatment for MRSA infections.      Studies:  Recent x-ray studies have been reviewed in detail by the Attending Physician  Scheduled Meds:  Scheduled Meds: . antiseptic oral rinse  7 mL Mouth Rinse BID  . aspirin EC  81 mg Oral Daily  . atorvastatin  40 mg Oral q1800  . clopidogrel  75 mg Oral QHS  . furosemide  20 mg Intravenous Daily  . insulin aspart  0-9 Units Subcutaneous Q4H  . levothyroxine  25 mcg Intravenous Daily  . metoCLOPramide (REGLAN) injection  10 mg Intravenous 3 times per day  . metoprolol  5 mg Intravenous 4 times per day  . pantoprazole (PROTONIX) IV  40 mg Intravenous Q12H  . pneumococcal 23 valent vaccine  0.5 mL Intramuscular Tomorrow-1000    Time spent on care of this patient: 40 mins   WOODS, Geraldo Docker , MD  [facility] Office  [phone] Pager - [phone]  On-Call/Text Page:      [email or web address]      password [email or web address]  If 7PM-7AM, please contact night-coverage [email or web address] Password [email or web address] 03/09/2015, 9:17 PM   LOS: 2 days   Care during the described time interval was provided by me .  I have reviewed this patient's available data, including medical history, events of note, physical examination, and all test results as part of my evaluation. I have personally reviewed and interpreted all radiology studies.   Dia Crawford, MD [phone] Pager

## 2015-03-09 NOTE — Progress Notes (Signed)
Gave 25 mg phenergan IV to pt before oral contrast was given per MD order. Pt is now very lethargic and hard to arouse. Pt O2 is 100% on 2LNC. HR 62. MD aware of pt condition due to the phenergan. Informed MD that pt would be unable to drink contrast until she was more awake. He stated that he was okay with the scan waiting until tomorrow when the medication wore off. Will continue to monitor the pt closely.

## 2015-03-09 NOTE — Progress Notes (Signed)
Patient ID: [name] Terry, female   DOB: 08-27-1950, 64 y.o.   MRN: [id number]    Primary cardiologist:  Vernie Murders [place]  Subjective:    More alert Less dyspnic Off Bipap Needs to mobilize and get OOB    Objective:   Temp:  [97.3 F (36.3 C)-98.4 F (36.9 C)] 98.2 F (36.8 C) (12/15 0300) Pulse Rate:  [64-77] 70 (12/15 0600) Resp:  [15-29] 20 (12/15 0600) BP: (98-130)/(54-80) 120/75 mmHg (12/15 0600) SpO2:  [97 %-100 %] 100 % (12/15 0600) Last BM Date: 03/08/15  [facility] Weights   03/06/15 2119 03/07/15 0400 03/08/15 0440  Weight: 92.3 kg (203 lb 7.8 oz) 92.1 kg (203 lb 0.7 oz) 90 kg (198 lb 6.6 oz)    Intake/Output Summary (Last 24 hours) at 03/09/15 [id number] Last data filed at 03/09/15 0800  Gross per 24 hour  Intake 1196.25 ml  Output    525 ml  Net 671.25 ml    Telemetry  SR   Exam:  General: Obese  Black female   Resp: Decreased BS base Bipap  Cardiac: S1/S2 MR  pacer under left clavicle  Right IJ catheter   GI: abdomen soft, NT, nD  MSK: no LE edema  Neuro: no focal deficits  Psych: labile   Lab Results:  Basic Metabolic Panel:  Recent Labs Lab 03/07/15 0442 03/08/15 0452 03/09/15 0444  NA 135 133* 133*  K 4.3 3.5 3.6  CL 97* 96* 97*  CO2 29 30 29   GLUCOSE 103* 87 127*  BUN 26* 27* 26*  CREATININE 2.38* 2.38* 2.25*  CALCIUM 8.6* 8.4* 8.4*  MG 1.9 2.2 2.1     CBC:  Recent Labs Lab 03/06/15 2154 03/07/15 0442 03/08/15 0452  WBC 12.2* 8.9 5.5  HGB 10.8* 10.9* 9.3*  HCT 33.1* 33.6* 28.7*  MCV 93.2 93.1 93.2  PLT 250 247 197    Cardiac Enzymes:  Recent Labs Lab 03/08/15 1615 03/08/15 2211 03/09/15 0444  TROPONINI 0.06* 0.05* 0.05*     ECG:  SR poor R wave progression old IMI    Medications:   Scheduled Medications: . amiodarone  200 mg Oral BID  . antiseptic oral rinse  7 mL Mouth Rinse q12n4p  . aspirin EC  81 mg Oral Daily  . atorvastatin  40 mg Oral q1800  . carvedilol  9.375 mg Oral BID WC  . chlorhexidine   15 mL Mouth Rinse BID  . clopidogrel  75 mg Oral QHS  . insulin aspart  0-9 Units Subcutaneous Q4H  . isosorbide dinitrate  10 mg Oral TID  . pantoprazole  40 mg Oral BID  . pneumococcal 23 valent vaccine  0.5 mL Intramuscular Tomorrow-1000    PRN Medications: acetaminophen, hydrALAZINE, ondansetron (ZOFRAN) IV, promethazine, traMADol     Assessment/Plan    1. CAD - fairly complex history, recent STEMI in [place] with occluded PDA that was medically managed, otherwise patent vessels. From notes interventional here has reviewed films and agree lesion is not amenable to revasc  Medical Rx recommended     2. Chronic sysotolic HF - echo [id number] LVEF 35-40% which is chronic - medical therapy with coreg, hydral/nitrates. No ACE or aldactone given poor renal function - Weights down but I/O;s positive  Will write for demedex 20 daily and follow Cr  I think issue leading to decompensation last night may involve ischemic MR.  Reviewed her echo and at baseline MR is moderate  May need TEE To further evaluate if she has recurrent  episodes of pulmonary edema  Doubt recurrent ischemia troponins flat CRF does not help with filling pressures And volume management   Milrinone would not be helpful overall EF only moderately reduced.    Advance diet to Carb modified  OOB     3. Afib - on amio and coreg - has not been on anticoag due to being on DAPT and chronic GI symptoms with risk for bleed according to prior notes. With recent hemoptysis would also avoid - rates improved since increasing her coreg  Outpatient f/u with Dr Rayann Heman who has seen her for VT ablation   4. Hemoptysis - per primary team, Hgb stable. Would continue DAPT  5. Hx of VT - prior ablations, remains on amiodarone. Has ICD.    Outpatient f/u with Dr Harl Bowie and with Dr Brigid Re

## 2015-03-10 ENCOUNTER — Inpatient Hospital Stay (HOSPITAL_COMMUNITY): Payer: Medicare Other

## 2015-03-10 DIAGNOSIS — I2510 Atherosclerotic heart disease of native coronary artery without angina pectoris: Secondary | ICD-10-CM | POA: Diagnosis present

## 2015-03-10 DIAGNOSIS — E785 Hyperlipidemia, unspecified: Secondary | ICD-10-CM

## 2015-03-10 DIAGNOSIS — I480 Paroxysmal atrial fibrillation: Secondary | ICD-10-CM

## 2015-03-10 DIAGNOSIS — E876 Hypokalemia: Secondary | ICD-10-CM

## 2015-03-10 LAB — COMPREHENSIVE METABOLIC PANEL
ALBUMIN: 2.3 g/dL — AB (ref 3.5–5.0)
ALT: 153 U/L — ABNORMAL HIGH (ref 14–54)
ANION GAP: 8 (ref 5–15)
AST: 123 U/L — AB (ref 15–41)
Alkaline Phosphatase: 51 U/L (ref 38–126)
BUN: 21 mg/dL — AB (ref 6–20)
CHLORIDE: 100 mmol/L — AB (ref 101–111)
CO2: 29 mmol/L (ref 22–32)
Calcium: 8.3 mg/dL — ABNORMAL LOW (ref 8.9–10.3)
Creatinine, Ser: 2.43 mg/dL — ABNORMAL HIGH (ref 0.44–1.00)
GFR calc Af Amer: 23 mL/min — ABNORMAL LOW (ref >60)
GFR, EST NON AFRICAN AMERICAN: 20 mL/min — AB (ref >60)
GLUCOSE: 96 mg/dL (ref 65–99)
POTASSIUM: 3.3 mmol/L — AB (ref 3.5–5.1)
Sodium: 137 mmol/L (ref 135–145)
TOTAL PROTEIN: 5.7 g/dL — AB (ref 6.5–8.1)
Total Bilirubin: 0.6 mg/dL (ref 0.3–1.2)

## 2015-03-10 LAB — CBC WITH DIFFERENTIAL/PLATELET
BASOS ABS: 0 10*3/uL (ref 0.0–0.1)
BASOS PCT: 0 %
Eosinophils Absolute: 0.2 10*3/uL (ref 0.0–0.7)
Eosinophils Relative: 5 %
HEMATOCRIT: 29 % — AB (ref 36.0–46.0)
HEMOGLOBIN: 9.8 g/dL — AB (ref 12.0–15.0)
LYMPHS PCT: 16 %
Lymphs Abs: 0.8 10*3/uL (ref 0.7–4.0)
MCH: 31.4 pg (ref 26.0–34.0)
MCHC: 33.8 g/dL (ref 30.0–36.0)
MCV: 92.9 fL (ref 78.0–100.0)
MONO ABS: 0.6 10*3/uL (ref 0.1–1.0)
Monocytes Relative: 11 %
NEUTROS ABS: 3.6 10*3/uL (ref 1.7–7.7)
NEUTROS PCT: 68 %
Platelets: 216 10*3/uL (ref 150–400)
RBC: 3.12 MIL/uL — ABNORMAL LOW (ref 3.87–5.11)
RDW: 16.5 % — AB (ref 11.5–15.5)
WBC: 5.2 10*3/uL (ref 4.0–10.5)

## 2015-03-10 LAB — GLUCOSE, CAPILLARY
GLUCOSE-CAPILLARY: 105 mg/dL — AB (ref 65–99)
GLUCOSE-CAPILLARY: 99 mg/dL (ref 65–99)
GLUCOSE-CAPILLARY: 105 mg/dL — AB (ref 65–99)
Glucose-Capillary: 88 mg/dL (ref 65–99)
Glucose-Capillary: 99 mg/dL (ref 65–99)

## 2015-03-10 LAB — MAGNESIUM: MAGNESIUM: 1.7 mg/dL (ref 1.7–2.4)

## 2015-03-10 MED ORDER — MAGNESIUM SULFATE 2 GM/50ML IV SOLN
2.0000 g | Freq: Once | INTRAVENOUS | Status: AC
  Administered 2015-03-10: 2 g via INTRAVENOUS
  Filled 2015-03-10: qty 50

## 2015-03-10 MED ORDER — SODIUM CHLORIDE 0.9 % IJ SOLN: 10.0000 mL | INTRAMUSCULAR | Status: DC | PRN

## 2015-03-10 MED ORDER — SODIUM CHLORIDE 0.9 % IJ SOLN
10.0000 mL | Freq: Two times a day (BID) | INTRAMUSCULAR | Status: DC
  Administered 2015-03-10 – 2015-03-11 (×3): 10 mL
  Administered 2015-03-12: 20 mL

## 2015-03-10 MED ORDER — POTASSIUM CHLORIDE 10 MEQ/100ML IV SOLN
10.0000 meq | INTRAVENOUS | Status: AC
  Administered 2015-03-10 (×5): 10 meq via INTRAVENOUS
  Filled 2015-03-10 (×4): qty 100

## 2015-03-10 NOTE — Progress Notes (Addendum)
[facility] TEAM 1 - Stepdown/ICU TEAM Progress Note  Mariacamila Morgart Prezioso [id number] DOB: 04-09-1950 DOA: 03/03/2015 PCP: Bronson Curb, PA-C  Admit HPI / Brief Narrative: 64 yo BF PMHx ischemic Cardiomyopathy, Cchronic Systolic CHF last EF [id number] in 01/2015, CKD, DM Type 2, CAD, STEMI in November 2016-treated medically at [facility] with a cardiac catheterization showing 100% stenosis of the proximal PDA;   Presents with continued chest pain and reports of coughing up blood. Patient was just admitted on on 12/7 for similar complaints. Patient reports taking all medications as advised but continues to have chest pain. She reported called her cardiologist's office for advice on what to do about her chest pain complaints, but was advised to come into the hospital. Patient reports that she's still intermittently having substernal chest pain that is a 10 out of 10 and sharp. Associated symptoms include, hemoptysis, difficulty breathing, nausea, and anxiety. Patient reports being unable to lie flat as it feels as though she suffocating. Prior to coming in today she states that she tried a nitroglycerin once and was also given 1 by EMS prior to arrival. Nitroglycerin helps relieve pain for a short period in time and then it returns. Patient notes symptoms of hemoptysis first occurred during her ICU stay at [facility]. At that time patient noted coughing up clots of blood. It seems these symptoms have now improved some and only report streaks mixed with sputum production. Upon arrival hemoglobin and hematocrit appear to be similar to appears to be patient's baseline.  HPI/Subjective: 12/16 A/O x 4, (+) Hematemesis prior to admission. Positive Postprandial N/V(significantly improve constantly asking for something to eat), mild positive Abd pain (almost completely resolved). Negative SOB , negative CP     Assessment/Plan: Chest pain/angina at rest:  -Resolved -Discontinue  isosorbide mononitrate (until patient can take PO), PRN nitroglycerin -Will change all medication back to PO in A.m. -PT/OT consult pending  CAD in native artery - S/P prior RCA PCI, cath 05/03/2013- med Rx, STEMI 01/2015 - 100% prox PDA, treated medically -Per cardiology note continue medical management no intervention warranted -Continue aspirin and Plavix  PAF with ischemic cardiomyopathy and congestive heart failure: -Patient is having postprandial N/V possible SBO/ileus unsure of absorption or monitor medication patient actually is keeping her system PO. -DC PO amiodarone restart IV amiodarone  -DC Coreg start metoprolol IV 5 mg QID -DC Demadex, start Lasix IV 20 mg daily -DC isosorbide dinitrate, PRN NTG 0.4 mg    HTN (hypertension):  -See paroxysmal atrial fibrillation  Cough with Hemoptysis/Hematemesis:  -Hemoptysis resolved  - Continue to monitor -See abdominal pain  Abdominal pain/Nausea/Vomiting  -NPO -R/O SBO/ileus; CT Abd/Pelvis PO contrast negative for acute findings - Psychogenic? Will consult psychiatry in A.m. -Consult GI in A.m.; EGD inpatient vs outpatient? -See Subclinical Hypothyroid -Continue Phenergen IV 25 mg QID] -Continue Reglan IV 10 mg TID  Chronic Gerd with history of hiatal hernia - Continue Protonix and Carafate -If no SBO/ileus consult GI for EGD  Anemia:  -Stable -Check CBC in a.m.   Diabetes mellitus type 2 in obese ([place]) -Discontinued oral hypoglycemic agents of glipizide -Sensitive SSI  Subclinical hypothyroidism:  -TSH was 17.69 on 12/4 with a FT4 of 0.78 at this point question if this week. Be playing a role in patients chest pain complaints and Abdominal Pain/ N/V . -Synthroid IV 25 mcg  Chronic kidney disease stage IV: ( baseline creatinine 2.2-2.5) -Stable. Patient with a creatinine of 2.43 on admission   -Continue  to monitor  Anxiety -On admission patient Xanax abruptly stopped -Start Ativan 1 mg TID PRN     HLD -Continue atorvastatin  Hypokalemia  -Potassium goal>4 -Potassium 50 mEq   Code Status: FULL Family Communication: no family present at time of exam Disposition Plan: Resolution Hematemesis; CIR vs SNF    Consultants: Dr.Peter Tommy [place] cardiology  Procedure/Significant Events: 12/16 CT abdomen pelvis WO contrast;-No acute findings identified within the abdomen or pelvis. -Right pleural effusion.- Bilateral renal cyst.-Colonic diverticulosis without acute inflammation.   Culture NA  Antibiotics: NA  DVT prophylaxis: SCD   Devices    LINES / TUBES:      Continuous Infusions: . amiodarone 30 mg/hr (03/10/15 1116)    Objective: VITAL SIGNS: Temp: 98.4 F (36.9 C) (12/16 1703) Temp Source: Oral (12/16 1703) BP: 128/76 mmHg (12/16 1703) Pulse Rate: 75 (12/16 1703) SPO2; FIO2:   Intake/Output Summary (Last 24 hours) at 03/10/15 1846 Last data filed at 03/10/15 1800  Gross per 24 hour  Intake 701.81 ml  Output   1350 ml  Net -648.19 ml     Exam: General: A/O x 4 , negative Abd Pain, No acute respiratory distress Eyes: Negative headache, eye pain, double vision,negative scleral hemorrhage ENT: Negative Runny nose, negative ear pain, negative gingival bleeding, Neck:  Negative scars, masses, torticollis, lymphadenopathy, JVD Lungs: Clear to auscultation bilaterally without wheezes or crackles Cardiovascular: Irregular irregular rhythm and rate, without murmur gallop or rub normal S1 and S2 Abdomen: Negative abdominal pain to palpation, nondistended, positive soft, bowel sounds, no rebound, no ascites, no appreciable mass Extremities: No significant cyanosis, clubbing, or edema bilateral lower extremities Psychiatric:  Negative depression, positive anxiety, negative fatigue, positive mania Neurologic:  Cranial nerves II through XII intact, tongue/uvula midline, all extremities muscle strength 5/5, sensation intact throughout, negative dysarthria,  negative expressive aphasia, negative receptive aphasia.   Data Reviewed: Basic Metabolic Panel:  Recent Labs Lab 03/06/15 2154 03/07/15 0442 03/08/15 0452 03/09/15 0444 03/10/15 0630  NA 130* 135 133* 133* 137  K 5.6* 4.3 3.5 3.6 3.3*  CL 95* 97* 96* 97* 100*  CO2 25 29 30 29 29   GLUCOSE 177* 103* 87 127* 96  BUN 26* 26* 27* 26* 21*  CREATININE 2.70* 2.38* 2.38* 2.25* 2.43*  CALCIUM 8.5* 8.6* 8.4* 8.4* 8.3*  MG 2.3 1.9 2.2 2.1 1.7  PHOS  --  4.1 3.6 3.2  --    Liver Function Tests:  Recent Labs Lab 03/10/15 0630  AST 123*  ALT 153*  ALKPHOS 51  BILITOT 0.6  PROT 5.7*  ALBUMIN 2.3*    Recent Labs Lab 03/08/15 2211  LIPASE 31  AMYLASE 64   No results for input(s): AMMONIA in the last 168 hours. CBC:  Recent Labs Lab 03/06/15 2154 03/07/15 0442 03/08/15 0452 03/09/15 0930 03/10/15 0630  WBC 12.2* 8.9 5.5 5.7 5.2  NEUTROABS  --   --   --  4.3 3.6  HGB 10.8* 10.9* 9.3* 9.4* 9.8*  HCT 33.1* 33.6* 28.7* 28.5* 29.0*  MCV 93.2 93.1 93.2 92.5 92.9  PLT 250 247 197 198 216   Cardiac Enzymes:  Recent Labs Lab 03/07/15 1445 03/07/15 2232 03/08/15 1615 03/08/15 2211 03/09/15 0444  TROPONINI 0.06* 0.06* 0.06* 0.05* 0.05*   BNP (last 3 results)  Recent Labs  02/20/15 1645 02/21/15 1042 03/01/15 0839  BNP 463.9* 386.1* 452.0*    ProBNP (last 3 results) No results for input(s): PROBNP in the last 8760 hours.  CBG:  Recent Labs Lab  03/09/15 2004 03/10/15 0020 03/10/15 0430 03/10/15 0808 03/10/15 1703  GLUCAP 95 88 105* 99 99    Recent Results (from the past 240 hour(s))  MRSA PCR Screening     Status: None   Collection Time: 03/07/15  2:25 AM  Result Value Ref Range Status   MRSA by PCR NEGATIVE NEGATIVE Final    Comment:        The GeneXpert MRSA Assay (FDA approved for NASAL specimens only), is one component of a comprehensive MRSA colonization surveillance program. It is not intended to diagnose MRSA infection nor to guide  or monitor treatment for MRSA infections.      Studies:  Recent x-ray studies have been reviewed in detail by the Attending Physician  Scheduled Meds:  Scheduled Meds: . antiseptic oral rinse  7 mL Mouth Rinse BID  . aspirin EC  81 mg Oral Daily  . atorvastatin  40 mg Oral q1800  . clopidogrel  75 mg Oral QHS  . furosemide  20 mg Intravenous Daily  . insulin aspart  0-9 Units Subcutaneous Q4H  . levothyroxine  25 mcg Intravenous Daily  . metoCLOPramide (REGLAN) injection  10 mg Intravenous 3 times per day  . metoprolol  5 mg Intravenous 4 times per day  . pantoprazole (PROTONIX) IV  40 mg Intravenous Q12H  . pneumococcal 23 valent vaccine  0.5 mL Intramuscular Tomorrow-1000  . sodium chloride  10-40 mL Intracatheter Q12H    Time spent on care of this patient: 40 mins   WOODS, Geraldo Docker , MD  [facility] Office  [phone] Pager - [phone]  On-Call/Text Page:      [email or web address]      password [email or web address]  If 7PM-7AM, please contact night-coverage [email or web address] Password [email or web address] 03/10/2015, 6:46 PM   LOS: 3 days   Care during the described time interval was provided by me .  I have reviewed this patient's available data, including medical history, events of note, physical examination, and all test results as part of my evaluation. I have personally reviewed and interpreted all radiology studies.   Dia Crawford, MD [phone] Pager

## 2015-03-10 NOTE — Care Management Important Message (Signed)
Important Message  Patient Details  Name: [name] Terry MRN: [id number] Date of Birth: 16-Oct-1950   Medicare Important Message Given:  Yes    Elvia Aydin P Ramona 03/10/2015, 2:17 PM

## 2015-03-10 NOTE — Progress Notes (Signed)
Patient ID: [name] Terry, female   DOB: 1950-11-11, 64 y.o.   MRN: [id number]    Primary cardiologist:  Vernie Murders [place]  Subjective:    Ambulated yesterday Mood still seems labile     Objective:   Temp:  [97.6 F (36.4 C)-99.3 F (37.4 C)] 98.5 F (36.9 C) (12/16 0800) Pulse Rate:  [63-75] 65 (12/16 0800) Resp:  [17-23] 17 (12/16 0800) BP: (112-139)/(75-90) 112/76 mmHg (12/16 0800) SpO2:  [99 %-100 %] 100 % (12/16 0800) Last BM Date: 03/08/15  [facility] Weights   03/06/15 2119 03/07/15 0400 03/08/15 0440  Weight: 92.3 kg (203 lb 7.8 oz) 92.1 kg (203 lb 0.7 oz) 90 kg (198 lb 6.6 oz)    Intake/Output Summary (Last 24 hours) at 03/10/15 [id number] Last data filed at 03/10/15 0700  Gross per 24 hour  Intake 418.11 ml  Output   1350 ml  Net -931.89 ml    Telemetry  SR   Exam:  General: Obese  Black female   Resp: Decreased BS base Bipap  Cardiac: S1/S2 MR  pacer under left clavicle  Right IJ catheter   GI: abdomen soft, NT, nD  MSK: no LE edema  Neuro: no focal deficits  Psych: labile   Lab Results:  Basic Metabolic Panel:  Recent Labs Lab 03/08/15 0452 03/09/15 0444 03/10/15 0630  NA 133* 133* 137  K 3.5 3.6 3.3*  CL 96* 97* 100*  CO2 30 29 29   GLUCOSE 87 127* 96  BUN 27* 26* 21*  CREATININE 2.38* 2.25* 2.43*  CALCIUM 8.4* 8.4* 8.3*  MG 2.2 2.1 1.7     CBC:  Recent Labs Lab 03/08/15 0452 03/09/15 0930 03/10/15 0630  WBC 5.5 5.7 5.2  HGB 9.3* 9.4* 9.8*  HCT 28.7* 28.5* 29.0*  MCV 93.2 92.5 92.9  PLT 197 198 216    Cardiac Enzymes:  Recent Labs Lab 03/08/15 1615 03/08/15 2211 03/09/15 0444  TROPONINI 0.06* 0.05* 0.05*     ECG:  SR poor R wave progression old IMI    Medications:   Scheduled Medications: . antiseptic oral rinse  7 mL Mouth Rinse BID  . aspirin EC  81 mg Oral Daily  . atorvastatin  40 mg Oral q1800  . clopidogrel  75 mg Oral QHS  . furosemide  20 mg Intravenous Daily  . insulin aspart  0-9 Units Subcutaneous  Q4H  . levothyroxine  25 mcg Intravenous Daily  . metoCLOPramide (REGLAN) injection  10 mg Intravenous 3 times per day  . metoprolol  5 mg Intravenous 4 times per day  . pantoprazole (PROTONIX) IV  40 mg Intravenous Q12H  . pneumococcal 23 valent vaccine  0.5 mL Intramuscular Tomorrow-1000  . potassium chloride  10 mEq Intravenous Q1 Hr x 5    PRN Medications: acetaminophen, hydrALAZINE, LORazepam, nitroGLYCERIN, ondansetron (ZOFRAN) IV, promethazine, traMADol     Assessment/Plan    1. CAD - fairly complex history, recent STEMI in [place] with occluded PDA that was medically managed, otherwise patent vessels. Reviewed angiogram and no revascularization option  Medical Rx recommended     2. Chronic sysotolic HF - echo [id number] LVEF 35-40% which is chronic - medical therapy with coreg, hydral/nitrates. No ACE or aldactone given poor renal function - Weights down but I/O;s positive  Will write for demedex 20 daily and follow Cr  I think issue leading to decompensation was  ischemic MR.  Reviewed her echo and at baseline MR is moderate  May need TEE To further evaluate  if she has recurrent episodes of pulmonary edema  Doubt recurrent ischemia troponins flat CRF does not help with filling pressures And volume management   Milrinone would not be helpful overall EF only moderately reduced.    OOB   3. Afib - on amio and coreg - has not been on anticoag due to being on DAPT and chronic GI symptoms with risk for bleed according to prior notes. With recent hemoptysis would also avoid - rates improved since increasing her coreg  Outpatient f/u with Dr Rayann Heman who has seen her for VT ablation   4. Hemoptysis - Resolved   5. Hx of VT - prior ablations, remains on amiodarone. Has ICD.    Outpatient f/u with Dr Harl Bowie and with Dr Charlett Blake to move out of unit    [facility]

## 2015-03-11 DIAGNOSIS — R1012 Left upper quadrant pain: Secondary | ICD-10-CM

## 2015-03-11 DIAGNOSIS — I481 Persistent atrial fibrillation: Secondary | ICD-10-CM

## 2015-03-11 DIAGNOSIS — I4819 Other persistent atrial fibrillation: Secondary | ICD-10-CM | POA: Diagnosis present

## 2015-03-11 LAB — COMPREHENSIVE METABOLIC PANEL
ALT: 132 U/L — ABNORMAL HIGH (ref 14–54)
ANION GAP: 10 (ref 5–15)
AST: 97 U/L — AB (ref 15–41)
Albumin: 2.3 g/dL — ABNORMAL LOW (ref 3.5–5.0)
Alkaline Phosphatase: 55 U/L (ref 38–126)
BILIRUBIN TOTAL: 0.6 mg/dL (ref 0.3–1.2)
BUN: 19 mg/dL (ref 6–20)
CHLORIDE: 97 mmol/L — AB (ref 101–111)
CO2: 29 mmol/L (ref 22–32)
Calcium: 8.6 mg/dL — ABNORMAL LOW (ref 8.9–10.3)
Creatinine, Ser: 2.24 mg/dL — ABNORMAL HIGH (ref 0.44–1.00)
GFR, EST AFRICAN AMERICAN: 26 mL/min — AB (ref >60)
GFR, EST NON AFRICAN AMERICAN: 22 mL/min — AB (ref >60)
Glucose, Bld: 92 mg/dL (ref 65–99)
POTASSIUM: 3.3 mmol/L — AB (ref 3.5–5.1)
Sodium: 136 mmol/L (ref 135–145)
TOTAL PROTEIN: 5.9 g/dL — AB (ref 6.5–8.1)

## 2015-03-11 LAB — CBC WITH DIFFERENTIAL/PLATELET
BASOS ABS: 0 10*3/uL (ref 0.0–0.1)
BASOS PCT: 0 %
Eosinophils Absolute: 0.3 10*3/uL (ref 0.0–0.7)
Eosinophils Relative: 7 %
HEMATOCRIT: 29.8 % — AB (ref 36.0–46.0)
Hemoglobin: 10 g/dL — ABNORMAL LOW (ref 12.0–15.0)
LYMPHS PCT: 18 %
Lymphs Abs: 0.8 10*3/uL (ref 0.7–4.0)
MCH: 31.2 pg (ref 26.0–34.0)
MCHC: 33.6 g/dL (ref 30.0–36.0)
MCV: 92.8 fL (ref 78.0–100.0)
Monocytes Absolute: 0.7 10*3/uL (ref 0.1–1.0)
Monocytes Relative: 14 %
NEUTROS ABS: 2.9 10*3/uL (ref 1.7–7.7)
NEUTROS PCT: 61 %
Platelets: 225 10*3/uL (ref 150–400)
RBC: 3.21 MIL/uL — AB (ref 3.87–5.11)
RDW: 16.1 % — AB (ref 11.5–15.5)
WBC: 4.7 10*3/uL (ref 4.0–10.5)

## 2015-03-11 LAB — GLUCOSE, CAPILLARY
GLUCOSE-CAPILLARY: 90 mg/dL (ref 65–99)
GLUCOSE-CAPILLARY: 99 mg/dL (ref 65–99)
Glucose-Capillary: 102 mg/dL — ABNORMAL HIGH (ref 65–99)
Glucose-Capillary: 90 mg/dL (ref 65–99)
Glucose-Capillary: 123 mg/dL — ABNORMAL HIGH (ref 65–99)
Glucose-Capillary: 147 mg/dL — ABNORMAL HIGH (ref 65–99)

## 2015-03-11 LAB — MAGNESIUM
MAGNESIUM: 7.5 mg/dL — AB (ref 1.7–2.4)
MAGNESIUM: 2.1 mg/dL (ref 1.7–2.4)
MAGNESIUM: 2 mg/dL (ref 1.7–2.4)

## 2015-03-11 LAB — POTASSIUM: Potassium: 3.8 mmol/L (ref 3.5–5.1)

## 2015-03-11 MED ORDER — PAROXETINE HCL 10 MG PO TABS
10.0000 mg | ORAL_TABLET | Freq: Every day | ORAL | Status: DC
  Administered 2015-03-11 – 2015-03-12 (×2): 10 mg via ORAL
  Filled 2015-03-11 (×2): qty 1

## 2015-03-11 MED ORDER — METOCLOPRAMIDE HCL 10 MG PO TABS
10.0000 mg | ORAL_TABLET | Freq: Three times a day (TID) | ORAL | Status: DC
  Administered 2015-03-11 – 2015-03-12 (×2): 10 mg via ORAL
  Filled 2015-03-11 (×3): qty 1

## 2015-03-11 MED ORDER — AMIODARONE HCL 200 MG PO TABS
200.0000 mg | ORAL_TABLET | Freq: Two times a day (BID) | ORAL | Status: DC
  Administered 2015-03-11 – 2015-03-12 (×3): 200 mg via ORAL
  Filled 2015-03-11 (×3): qty 1

## 2015-03-11 MED ORDER — ALPRAZOLAM 0.5 MG PO TABS
0.5000 mg | ORAL_TABLET | Freq: Three times a day (TID) | ORAL | Status: DC | PRN
  Administered 2015-03-11: 0.5 mg via ORAL
  Filled 2015-03-11: qty 1

## 2015-03-11 MED ORDER — TORSEMIDE 20 MG PO TABS
20.0000 mg | ORAL_TABLET | Freq: Every day | ORAL | Status: DC
  Administered 2015-03-11 – 2015-03-12 (×2): 20 mg via ORAL
  Filled 2015-03-11 (×2): qty 1

## 2015-03-11 MED ORDER — PROMETHAZINE HCL 25 MG PO TABS: 12.5000 mg | ORAL_TABLET | Freq: Four times a day (QID) | ORAL | Status: DC | PRN

## 2015-03-11 MED ORDER — PANTOPRAZOLE SODIUM 40 MG PO TBEC
40.0000 mg | DELAYED_RELEASE_TABLET | Freq: Two times a day (BID) | ORAL | Status: DC
  Administered 2015-03-11 – 2015-03-12 (×3): 40 mg via ORAL
  Filled 2015-03-11 (×3): qty 1

## 2015-03-11 MED ORDER — INSULIN ASPART 100 UNIT/ML ~~LOC~~ SOLN
2.0000 [IU] | Freq: Three times a day (TID) | SUBCUTANEOUS | Status: DC
  Administered 2015-03-12: 2 [IU] via SUBCUTANEOUS

## 2015-03-11 MED ORDER — POTASSIUM CHLORIDE 10 MEQ/100ML IV SOLN
10.0000 meq | INTRAVENOUS | Status: AC
  Administered 2015-03-11 (×5): 10 meq via INTRAVENOUS
  Filled 2015-03-11 (×5): qty 100

## 2015-03-11 MED ORDER — LEVOTHYROXINE SODIUM 50 MCG PO TABS
50.0000 ug | ORAL_TABLET | Freq: Every day | ORAL | Status: DC
  Administered 2015-03-12: 50 ug via ORAL
  Filled 2015-03-11: qty 1

## 2015-03-11 MED ORDER — CARVEDILOL 6.25 MG PO TABS
9.3750 mg | ORAL_TABLET | Freq: Two times a day (BID) | ORAL | Status: DC
  Administered 2015-03-11 – 2015-03-12 (×3): 9.375 mg via ORAL
  Filled 2015-03-11 (×3): qty 1

## 2015-03-11 MED ORDER — ISOSORBIDE DINITRATE 10 MG PO TABS
10.0000 mg | ORAL_TABLET | Freq: Three times a day (TID) | ORAL | Status: DC
  Administered 2015-03-11 (×2): 10 mg via ORAL
  Filled 2015-03-11 (×2): qty 1

## 2015-03-11 NOTE — Progress Notes (Signed)
Patient is currently on [facility] with sats of 99%. All vitals are stable and she is in no distress. BIPAP is not needed at this time.

## 2015-03-11 NOTE — Progress Notes (Signed)
SUBJECTIVE:  She is hungry but does not report pain or SOB.    PHYSICAL EXAM Filed Vitals:   03/11/15 0321 03/11/15 0730 03/11/15 0801 03/11/15 1000  BP: 124/73  129/78 112/86  Pulse: 66  66 76  Temp: 98.3 F (36.8 C)  98.5 F (36.9 C)   TempSrc: Oral  Oral   Resp: 27  21   Height:      Weight:      SpO2: 99% 99% 99% 100%   General:  No distress Lungs:  Clear Heart:  Irregular Abdomen:  Positive bowel sounds, no rebound no guarding Extremities:  No edema   LABS: Lab Results  Component Value Date   TROPONINI 0.05* 03/09/2015   Results for orders placed or performed during the hospital encounter of 03/03/15 (from the past 24 hour(s))  Glucose, capillary     Status: None   Collection Time: 03/10/15  5:03 PM  Result Value Ref Range   Glucose-Capillary 99 65 - 99 mg/dL   Comment 1 Capillary Specimen   Glucose, capillary     Status: Abnormal   Collection Time: 03/10/15  7:24 PM  Result Value Ref Range   Glucose-Capillary 105 (H) 65 - 99 mg/dL   Comment 1 Capillary Specimen   Glucose, capillary     Status: Abnormal   Collection Time: 03/11/15 12:04 AM  Result Value Ref Range   Glucose-Capillary 102 (H) 65 - 99 mg/dL  Glucose, capillary     Status: None   Collection Time: 03/11/15  3:24 AM  Result Value Ref Range   Glucose-Capillary 90 65 - 99 mg/dL   Comment 1 Capillary Specimen   Comprehensive metabolic panel     Status: Abnormal   Collection Time: 03/11/15  5:40 AM  Result Value Ref Range   Sodium 136 135 - 145 mmol/L   Potassium 3.3 (L) 3.5 - 5.1 mmol/L   Chloride 97 (L) 101 - 111 mmol/L   CO2 29 22 - 32 mmol/L   Glucose, Bld 92 65 - 99 mg/dL   BUN 19 6 - 20 mg/dL   Creatinine, Ser 2.24 (H) 0.44 - 1.00 mg/dL   Calcium 8.6 (L) 8.9 - 10.3 mg/dL   Total Protein 5.9 (L) 6.5 - 8.1 g/dL   Albumin 2.3 (L) 3.5 - 5.0 g/dL   AST 97 (H) 15 - 41 U/L   ALT 132 (H) 14 - 54 U/L   Alkaline Phosphatase 55 38 - 126 U/L   Total Bilirubin 0.6 0.3 - 1.2 mg/dL   GFR calc  non Af Amer 22 (L) >60 mL/min   GFR calc Af Amer 26 (L) >60 mL/min   Anion gap 10 5 - 15  Magnesium     Status: Abnormal   Collection Time: 03/11/15  5:40 AM  Result Value Ref Range   Magnesium 7.5 (HH) 1.7 - 2.4 mg/dL  CBC with Differential/Platelet     Status: Abnormal   Collection Time: 03/11/15  5:40 AM  Result Value Ref Range   WBC 4.7 4.0 - 10.5 K/uL   RBC 3.21 (L) 3.87 - 5.11 MIL/uL   Hemoglobin 10.0 (L) 12.0 - 15.0 g/dL   HCT 29.8 (L) 36.0 - 46.0 %   MCV 92.8 78.0 - 100.0 fL   MCH 31.2 26.0 - 34.0 pg   MCHC 33.6 30.0 - 36.0 g/dL   RDW 16.1 (H) 11.5 - 15.5 %   Platelets 225 150 - 400 K/uL   Neutrophils Relative % 61 %  Neutro Abs 2.9 1.7 - 7.7 K/uL   Lymphocytes Relative 18 %   Lymphs Abs 0.8 0.7 - 4.0 K/uL   Monocytes Relative 14 %   Monocytes Absolute 0.7 0.1 - 1.0 K/uL   Eosinophils Relative 7 %   Eosinophils Absolute 0.3 0.0 - 0.7 K/uL   Basophils Relative 0 %   Basophils Absolute 0.0 0.0 - 0.1 K/uL  Magnesium     Status: None   Collection Time: 03/11/15  7:00 AM  Result Value Ref Range   Magnesium 2.1 1.7 - 2.4 mg/dL  Glucose, capillary     Status: None   Collection Time: 03/11/15  8:05 AM  Result Value Ref Range   Glucose-Capillary 90 65 - 99 mg/dL    Intake/Output Summary (Last 24 hours) at 03/11/15 1049 Last data filed at 03/11/15 1000  Gross per 24 hour  Intake  500.8 ml  Output   1550 ml  Net -1049.2 ml    ASSESSMENT AND PLAN:  CAD:  Medical management as outlined by Dr. Johnsie Cancel.  CHRONIC SYSTOLIC HF:  Continue current meds.  TEE if she has any further decompensation to further assess MR.    ATRIAL FIB:    Not an anticoagulation candidate at this point.  Rates OK.   She was on chronic amiodarone as an out patient and this had been increased to Dr. Rayann Heman to 200 mg PO daily.  I will switch to PO.   CKD:  Creat is stable.      Jeneen Rinks [facility] 03/11/2015 10:49 AM

## 2015-03-11 NOTE — Progress Notes (Signed)
[place] TEAM 1 - Stepdown/ICU TEAM Progress Note  Jannett Lagle Hossain [id number] DOB: [birth year] DOA: 03/03/2015 PCP: Bronson Curb, PA-C  Admit HPI / Brief Narrative: 64 yo BF PMHx ischemic Cardiomyopathy, Cchronic Systolic CHF last EF [id number] in 01/2015, CKD, DM Type 2, CAD, STEMI in November 2016-treated medically at [facility] with a cardiac catheterization showing 100% stenosis of the proximal PDA;   Presents with continued chest pain and reports of coughing up blood. Patient was just admitted on on 12/7 for similar complaints. Patient reports taking all medications as advised but continues to have chest pain. She reported called her cardiologist's office for advice on what to do about her chest pain complaints, but was advised to come into the hospital. Patient reports that she's still intermittently having substernal chest pain that is a 10 out of 10 and sharp. Associated symptoms include, hemoptysis, difficulty breathing, nausea, and anxiety. Patient reports being unable to lie flat as it feels as though she suffocating. Prior to coming in today she states that she tried a nitroglycerin once and was also given 1 by EMS prior to arrival. Nitroglycerin helps relieve pain for a short period in time and then it returns. Patient notes symptoms of hemoptysis first occurred during her ICU stay at [facility]. At that time patient noted coughing up clots of blood. It seems these symptoms have now improved some and only report streaks mixed with sputum production. Upon arrival hemoglobin and hematocrit appear to be similar to appears to be patient's baseline.  HPI/Subjective: 12/17  A/O x 4, sitting in chair comfortably, negative Postprandial N/V, negative abdominal pain, Negative SOB,  negative CP     Assessment/Plan: Chest pain/angina at rest:  -Resolved -Discontinue isosorbide mononitrate (until patient can take PO), PRN nitroglycerin -Will change all medication  back to PO in A.m. -PT/OT consult pending; patient open to going to SNF for a while. States she prefers one in [place] or [place]   CAD in native artery - S/P prior RCA PCI, cath 05/03/2013- med Rx, STEMI 01/2015 - 100% prox PDA, treated medically -Per cardiology note continue medical management no intervention warranted -Patient now tolerating PO meds.  -Restart Aspirin 81 mg daily  -Plavix 75 mg daily  PAF with ischemic cardiomyopathy and congestive heart failure: -Currently rate controlled. -Amiodarone 200 mg  BID  -Coreg Coreg 9.375 mg  BID -Demadex, 20 mg daily -Isosorbide dinitrate, 10 mg TID     HTN (hypertension):  -See paroxysmal atrial fibrillation  Cough with Hemoptysis/Hematemesis:  -Hemoptysis resolved  - Continue to monitor -See abdominal pain  Abdominal pain/Nausea/Vomiting  -Most likely multifactorial to include abrupt stop of home benzodiazepine, noncompliance with medication, heart failure, and subclinical hypothyroidism. -R/O SBO/ileus; CT Abd/Pelvis PO contrast negative for acute findings -See Subclinical Hypothyroid -Phenergen 12.5 mg QID] -Reglan 10 mg TID -Tolerating clear liquid diet with advancement in A.m.  Chronic Gerd with history of hiatal hernia - Continue Protonix 40 mg BID  -GI as outpatient if required. Patient now asymptomatic  Anemia:  -Stable  Diabetes mellitus type 2 in obese ([place]tha) controlled -Would not restart oral hypoglycemic agents secondary to her chronic kidney disease  -Start NovoLog 2 units QAC -Sensitive SSI  Subclinical Hypothyroidism:  -TSH was 17.69 on 12/4 with a FT4 of 0.78 at this point question if this week. Be playing a role in patients chest pain complaints and Abdominal Pain/ N/V . -Synthroid 50 g daily  Chronic kidney disease stage IV: ( baseline creatinine 2.2-2.5) -Stable.  Patient with a creatinine of 2.43 on admission   -Currently at baseline   Anxiety -On admission patient Xanax abruptly  stopped -Restart Xanax 0.5 mg TID PRN   -Patient has also agreed to start SSRI. Start Paxil 10 mg daily  HLD -Continue atorvastatin 40 mg daily  Hypokalemia  -Potassium goal>4 -Potassium 50 mEq -Recheck potassium/magnesium 1400    Code Status: FULL Family Communication: no family present at time of exam Disposition Plan: CIR vs SNF    Consultants: Dr.Peter Tommy [place] cardiology  Procedure/Significant Events: 12/16 CT abdomen pelvis WO contrast;-No acute findings identified within the abdomen or pelvis. -Right pleural effusion.- Bilateral renal cyst.-Colonic diverticulosis without acute inflammation.   Culture NA  Antibiotics: NA  DVT prophylaxis: SCD   Devices    LINES / TUBES:      Continuous Infusions:    Objective: VITAL SIGNS: Temp: 98.5 F (36.9 C) (12/17 1201) Temp Source: Oral (12/17 1201) BP: 113/70 mmHg (12/17 1200) Pulse Rate: 65 (12/17 1200) SPO2; FIO2:   Intake/Output Summary (Last 24 hours) at 03/11/15 1459 Last data filed at 03/11/15 1329  Gross per 24 hour  Intake 756.27 ml  Output   1150 ml  Net -393.73 ml     Exam: General: A/O x 4 , negative Abd Pain, sitting in chair comfortably, No acute respiratory distress Eyes: Negative headache, eye pain, double vision,negative scleral hemorrhage ENT: Negative Runny nose, negative ear pain, negative gingival bleeding, Neck:  Negative scars, masses, torticollis, lymphadenopathy, JVD Lungs: Clear to auscultation bilaterally without wheezes or crackles Cardiovascular: Irregular irregular rhythm and rate, without murmur gallop or rub normal S1 and S2 Abdomen: Negative abdominal pain to palpation, nondistended, positive soft, bowel sounds, no rebound, no ascites, no appreciable mass Extremities: No significant cyanosis, clubbing, or edema bilateral lower extremities Psychiatric:  Negative depression, positive anxiety, negative fatigue, positive mania Neurologic:  Cranial nerves II through  XII intact, tongue/uvula midline, all extremities muscle strength 5/5, sensation intact throughout, negative dysarthria, negative expressive aphasia, negative receptive aphasia.   Data Reviewed: Basic Metabolic Panel:  Recent Labs Lab 03/07/15 0442 03/08/15 0452 03/09/15 0444 03/10/15 0630 03/11/15 0540 03/11/15 0700  NA 135 133* 133* 137 136  --   K 4.3 3.5 3.6 3.3* 3.3*  --   CL 97* 96* 97* 100* 97*  --   CO2 29 30 29 29 29   --   GLUCOSE 103* 87 127* 96 92  --   BUN 26* 27* 26* 21* 19  --   CREATININE 2.38* 2.38* 2.25* 2.43* 2.24*  --   CALCIUM 8.6* 8.4* 8.4* 8.3* 8.6*  --   MG 1.9 2.2 2.1 1.7 7.5* 2.1  PHOS 4.1 3.6 3.2  --   --   --    Liver Function Tests:  Recent Labs Lab 03/10/15 0630 03/11/15 0540  AST 123* 97*  ALT 153* 132*  ALKPHOS 51 55  BILITOT 0.6 0.6  PROT 5.7* 5.9*  ALBUMIN 2.3* 2.3*    Recent Labs Lab 03/08/15 2211  LIPASE 31  AMYLASE 64   No results for input(s): AMMONIA in the last 168 hours. CBC:  Recent Labs Lab 03/07/15 0442 03/08/15 0452 03/09/15 0930 03/10/15 0630 03/11/15 0540  WBC 8.9 5.5 5.7 5.2 4.7  NEUTROABS  --   --  4.3 3.6 2.9  HGB 10.9* 9.3* 9.4* 9.8* 10.0*  HCT 33.6* 28.7* 28.5* 29.0* 29.8*  MCV 93.1 93.2 92.5 92.9 92.8  PLT 247 197 198 216 225   Cardiac Enzymes:  Recent  Labs Lab 03/07/15 1445 03/07/15 2232 03/08/15 1615 03/08/15 2211 03/09/15 0444  TROPONINI 0.06* 0.06* 0.06* 0.05* 0.05*   BNP (last 3 results)  Recent Labs  02/20/15 1645 02/21/15 1042 03/01/15 0839  BNP 463.9* 386.1* 452.0*    ProBNP (last 3 results) No results for input(s): PROBNP in the last 8760 hours.  CBG:  Recent Labs Lab 03/10/15 1924 03/11/15 0004 03/11/15 0324 03/11/15 0805 03/11/15 1203  GLUCAP 105* 102* 90 90 99    Recent Results (from the past 240 hour(s))  MRSA PCR Screening     Status: None   Collection Time: 03/07/15  2:25 AM  Result Value Ref Range Status   MRSA by PCR NEGATIVE NEGATIVE Final     Comment:        The GeneXpert MRSA Assay (FDA approved for NASAL specimens only), is one component of a comprehensive MRSA colonization surveillance program. It is not intended to diagnose MRSA infection nor to guide or monitor treatment for MRSA infections.      Studies:  Recent x-ray studies have been reviewed in detail by the Attending Physician  Scheduled Meds:  Scheduled Meds: . amiodarone  200 mg Oral BID  . antiseptic oral rinse  7 mL Mouth Rinse BID  . aspirin EC  81 mg Oral Daily  . atorvastatin  40 mg Oral q1800  . carvedilol  9.375 mg Oral BID WC  . clopidogrel  75 mg Oral QHS  . insulin aspart  0-9 Units Subcutaneous Q4H  . insulin aspart  2 Units Subcutaneous TID WC  . isosorbide dinitrate  10 mg Oral TID  . [START ON 03/12/2015] levothyroxine  50 mcg Oral QAC breakfast  . metoCLOPramide  10 mg Oral TID AC  . pantoprazole  40 mg Oral BID  . PARoxetine  10 mg Oral Daily  . pneumococcal 23 valent vaccine  0.5 mL Intramuscular Tomorrow-1000  . potassium chloride  10 mEq Intravenous Q1 Hr x 5  . sodium chloride  10-40 mL Intracatheter Q12H  . torsemide  20 mg Oral Daily    Time spent on care of this patient: 40 mins   Bowie Doiron, Geraldo Docker , MD  [facility] Office  [phone] Pager - [phone]  On-Call/Text Page:      [email or web address]      password [email or web address]  If 7PM-7AM, please contact night-coverage [email or web address] Password [email or web address] 03/11/2015, 2:59 PM   LOS: 4 days   Care during the described time interval was provided by me .  I have reviewed this patient's available data, including medical history, events of note, physical examination, and all test results as part of my evaluation. I have personally reviewed and interpreted all radiology studies.   Dia Crawford, MD [phone] Pager

## 2015-03-12 DIAGNOSIS — R1084 Generalized abdominal pain: Secondary | ICD-10-CM

## 2015-03-12 DIAGNOSIS — N184 Chronic kidney disease, stage 4 (severe): Secondary | ICD-10-CM

## 2015-03-12 DIAGNOSIS — Z9114 Patient's other noncompliance with medication regimen: Secondary | ICD-10-CM

## 2015-03-12 DIAGNOSIS — Z91148 Patient's other noncompliance with medication regimen for other reason: Secondary | ICD-10-CM | POA: Diagnosis present

## 2015-03-12 DIAGNOSIS — E1122 Type 2 diabetes mellitus with diabetic chronic kidney disease: Secondary | ICD-10-CM

## 2015-03-12 DIAGNOSIS — K449 Diaphragmatic hernia without obstruction or gangrene: Secondary | ICD-10-CM

## 2015-03-12 DIAGNOSIS — K2100 Gastro-esophageal reflux disease with esophagitis, without bleeding: Secondary | ICD-10-CM | POA: Diagnosis present

## 2015-03-12 LAB — CBC WITH DIFFERENTIAL/PLATELET
BASOS ABS: 0 10*3/uL (ref 0.0–0.1)
BASOS PCT: 0 %
EOS ABS: 0.3 10*3/uL (ref 0.0–0.7)
EOS PCT: 7 %
HCT: 28.2 % — ABNORMAL LOW (ref 36.0–46.0)
Hemoglobin: 9.5 g/dL — ABNORMAL LOW (ref 12.0–15.0)
LYMPHS PCT: 16 %
Lymphs Abs: 0.8 10*3/uL (ref 0.7–4.0)
MCH: 31 pg (ref 26.0–34.0)
MCHC: 33.7 g/dL (ref 30.0–36.0)
MCV: 92.2 fL (ref 78.0–100.0)
MONO ABS: 0.6 10*3/uL (ref 0.1–1.0)
Monocytes Relative: 12 %
Neutro Abs: 3 10*3/uL (ref 1.7–7.7)
Neutrophils Relative %: 65 %
PLATELETS: 209 10*3/uL (ref 150–400)
RBC: 3.06 MIL/uL — ABNORMAL LOW (ref 3.87–5.11)
RDW: 16.1 % — AB (ref 11.5–15.5)
WBC: 4.7 10*3/uL (ref 4.0–10.5)

## 2015-03-12 LAB — COMPREHENSIVE METABOLIC PANEL
ALT: 122 U/L — AB (ref 14–54)
AST: 89 U/L — AB (ref 15–41)
Albumin: 2.3 g/dL — ABNORMAL LOW (ref 3.5–5.0)
Alkaline Phosphatase: 52 U/L (ref 38–126)
Anion gap: 6 (ref 5–15)
BILIRUBIN TOTAL: 1 mg/dL (ref 0.3–1.2)
BUN: 18 mg/dL (ref 6–20)
CALCIUM: 8.4 mg/dL — AB (ref 8.9–10.3)
CO2: 29 mmol/L (ref 22–32)
CREATININE: 2.42 mg/dL — AB (ref 0.44–1.00)
Chloride: 100 mmol/L — ABNORMAL LOW (ref 101–111)
GFR calc Af Amer: 23 mL/min — ABNORMAL LOW (ref >60)
GFR, EST NON AFRICAN AMERICAN: 20 mL/min — AB (ref >60)
Glucose, Bld: 79 mg/dL (ref 65–99)
Potassium: 3.5 mmol/L (ref 3.5–5.1)
Sodium: 135 mmol/L (ref 135–145)
TOTAL PROTEIN: 5.5 g/dL — AB (ref 6.5–8.1)

## 2015-03-12 LAB — GLUCOSE, CAPILLARY
GLUCOSE-CAPILLARY: 75 mg/dL (ref 65–99)
Glucose-Capillary: 68 mg/dL (ref 65–99)
Glucose-Capillary: 84 mg/dL (ref 65–99)
Glucose-Capillary: 93 mg/dL (ref 65–99)

## 2015-03-12 LAB — MAGNESIUM: MAGNESIUM: 1.9 mg/dL (ref 1.7–2.4)

## 2015-03-12 MED ORDER — LEVOTHYROXINE SODIUM 50 MCG PO TABS: 50.0000 ug | ORAL_TABLET | Freq: Every day | ORAL | Status: AC

## 2015-03-12 MED ORDER — TORSEMIDE 20 MG PO TABS: 20.0000 mg | ORAL_TABLET | Freq: Every day | ORAL | Status: AC

## 2015-03-12 MED ORDER — ISOSORBIDE MONONITRATE ER 60 MG PO TB24
60.0000 mg | ORAL_TABLET | Freq: Every day | ORAL | Status: DC
  Administered 2015-03-12: 60 mg via ORAL
  Filled 2015-03-12: qty 1

## 2015-03-12 MED ORDER — ISOSORBIDE MONONITRATE ER 60 MG PO TB24: 60.0000 mg | ORAL_TABLET | Freq: Every day | ORAL | Status: DC

## 2015-03-12 MED ORDER — PAROXETINE HCL 10 MG PO TABS: 10.0000 mg | ORAL_TABLET | Freq: Every day | ORAL | Status: AC

## 2015-03-12 MED ORDER — CARVEDILOL 6.25 MG PO TABS: 9.3750 mg | ORAL_TABLET | Freq: Two times a day (BID) | ORAL | Status: DC

## 2015-03-12 NOTE — Care Management Note (Signed)
Case Management Note  Patient Details  Name: [name] Terry MRN: [id number] Date of Birth: 1950-04-17  Subjective/Objective:                  Cardiomyopathy, Chronic Systolic CHF   Action/Plan: CM spoke with the patient at the bedside. Patient provided with a list of home health providers in her county. She selected [facility] for [place]. Home health orders, facesheet and H&P faxed to [facility] [phone].   Expected Discharge Date:  03/06/15               Expected Discharge Plan:  [place]  In-House Referral:     Discharge planning Services  CM Consult  Post Acute Care Choice:  Home Health Choice offered to:  Patient  DME Arranged:    DME Agency:     HH Arranged:  PT, Nurse's Aide [place] Agency:  Other - See comment ([place])  Status of Service:  Completed, signed off  Medicare Important Message Given:  Yes Date Medicare IM Given:    Medicare IM give by:    Date Additional Medicare IM Given:    Additional Medicare Important Message give by:     If discussed at [place] of Stay Meetings, dates discussed:    Additional Comments:  Apolonio Schneiders, RN 03/12/2015, 3:09 PM

## 2015-03-12 NOTE — H&P (Signed)
Discharge instructions reviewed with patient.  These included the following:  Medication and importance of therapeutic compliance with regimen, MD appointments, [place] follow up with PT, when to call the MD, dietary concerns, importance of following diabetic regimen, importance of calling MD for recurring symptoms which cannot be resolved by rest or prn medication, etc.  Pt. Is now awaiting ride to take her home.  Central line was discontinued by writer and bedrest maintained x 1 hour.

## 2015-03-12 NOTE — Evaluation (Signed)
Physical Therapy Evaluation Patient Details Name: [name] Terry MRN: [id number] DOB: 1950-07-07 Today's Date: 03/12/2015   History of Present Illness    64 yo BF PMHx ischemic Cardiomyopathy, Cchronic Systolic CHF last EF 40-98% in 01/2015, CKD, DM Type 2, CAD, STEMI in November 2016-treated medically at [facility] with a cardiac catheterization showing 100% stenosis of the proximal PDA; Presents with continued chest pain and reports of coughing up blood.    Clinical Impression  Pt presents with mild to moderate limitations to functional mobility due to weakness, fatigue and discomfort with central line location impacting muscular endurance and gait mechanics.  Anticipate with rest, nutritional intake and repeats short bouts of activity over next 24 hours pt will be close to baseline level and ready to d/c directly home. Discussed with RN and CNA and asked to assist with bathroom access and short walks in the hall.  Pt agreeable to HHPT services to assist with evaluation in home environment.  No further PT needs acutely at this time.  See below for details of examination findings.  Thank you    Follow Up Recommendations Home health PT    Equipment Recommendations  None recommended by PT    Recommendations for Other Services       Precautions / Restrictions Precautions Precautions: Fall Precaution Comments: up with supervision Restrictions Weight Bearing Restrictions: No      Mobility  Bed Mobility Overal bed mobility: Independent                Transfers Overall transfer level: Modified independent Equipment used: None                Ambulation/Gait Ambulation/Gait assistance: Supervision Ambulation Distance (Feet): 100 Feet Assistive device: None Gait Pattern/deviations: Step-through pattern;Wide base of support;Decreased stride length Gait velocity: decreased Gait velocity interpretation: Below normal speed for age/gender General Gait  Details: decr velocity on observation, with difficulty changing speeds or stride  Stairs         General stair comments: expect pt will have no trouble asc/desc 3 steps to access  home; may be ramp in place since last admission  Wheelchair Mobility    Modified Rankin (Stroke Patients Only)       Balance                                             Pertinent Vitals/Pain Pain Assessment: 0-10 Pain Score: 2  Pain Location: discomfort at central line left neck area    Home Living Family/patient expects to be discharged to:: Private residence Living Arrangements: Alone Available Help at Discharge: Family;Available PRN/intermittently Type of Home: House Home Access: Stairs to enter Entrance Stairs-Rails: Can reach both Entrance Stairs-Number of Steps: 3 Home Layout: One level Home Equipment: Shower seat Additional Comments: sister lives there/nearby but "we hate each other" though sister will help with shopping    Prior Function Level of Independence: Independent               Hand Dominance   Dominant Hand: Right    Extremity/Trunk Assessment   Upper Extremity Assessment: Generalized weakness           Lower Extremity Assessment: Generalized weakness      Cervical / Trunk Assessment: Normal  Communication   Communication: No difficulties  Cognition Arousal/Alertness: Awake/alert Behavior During Therapy: Flat affect (exhausted) Overall Cognitive Status: Within  Functional Limits for tasks assessed                      General Comments      Exercises        Assessment/Plan    PT Assessment All further PT needs can be met in the next venue of care  PT Diagnosis Difficulty walking   PT Problem List Cardiopulmonary status limiting activity;Decreased mobility;Decreased activity tolerance  PT Treatment Interventions     PT Goals (Current goals can be found in the Care Plan section) Acute Rehab PT Goals Patient Stated  Goal: go about my normal day on my own PT Goal Formulation: All assessment and education complete, DC therapy    Frequency     Barriers to discharge        Co-evaluation               End of Session   Activity Tolerance: Patient limited by lethargy Patient left: in chair;with call bell/phone within reach;with nursing/sitter in room Nurse Communication: Mobility status         Time: 1024-1050 PT Time Calculation (min) (ACUTE ONLY): 26 min   Charges:   PT Evaluation $Initial PT Evaluation Tier I: 1 Procedure PT Treatments $Therapeutic Activity: 8-22 mins   PT G Codes:        Herbie Drape 03/12/2015, 10:57 AM

## 2015-03-12 NOTE — Discharge Summary (Signed)
Physician Discharge Summary  [name] Terry [id number] DOB: 1951-01-21 DOA: 03/03/2015  PCP: Bronson Curb, PA-C  Admit date: 03/03/2015 Discharge date: 03/12/2015  Time spent: 40 minutes  Recommendations for Outpatient Follow-up:  Chest pain/angina at rest:  -Resolved -PT/OT; home health/PT   CAD in native artery - S/P prior RCA PCI, cath 05/03/2013- med Rx, STEMI 01/2015 - 100% occluded prox PDA, treated medically -Per cardiology note continue medical management no intervention warranted -Continue Aspirin 81 mg daily  -Plavix 75 mg daily  PAF with ischemic cardiomyopathy and congestive heart failure: -Currently rate controlled. -Amiodarone 200 mg BID  -Coreg Coreg 9.375 mg BID -Demadex, 20 mg daily -Isosorbide mononitrate 60 mg daily - Schedule appointment for Follow-up with Dr.James Allred cardiologist in 1-2 weeks -Patient to weigh herself daily and record in log. If patient gains more than 3 pounds in 24 hours contact cardiology office or PCP to adjust diuretic medication. Take log book to all follow-up appointments. -Patient to weigh herself as soon as she arrives home today this will be her base weight  HTN (hypertension):  -See paroxysmal atrial fibrillation  Cough with Hemoptysis/Hematemesis:  -Hemoptysis resolved  -See abdominal pain  Abdominal pain/Nausea/Vomiting  -Most likely multifactorial to include abrupt stop of home benzodiazepine, noncompliance with medication, heart failure, and subclinical hypothyroidism. -CT Abd/Pelvis PO contrast negative for acute findings i.e. negative SBO/ileus -See Subclinical Hypothyroid -Tolerating heart healthy lash American diabetic Association diet. Patient to continue at home  Chronic Jerrye Bushy with history of hiatal hernia -Continue Protonix 40 mg BID  -GI as outpatient if required. Patient now asymptomatic -Schedule appointment to Follow-up with PA Elberta Fortis ROBERTSON,(PCP) in 2 weeks  Anemia:   -Stable  Diabetes mellitus type 2 in obese ([place]) controlled -Would not restart oral hypoglycemic agents secondary to her chronic kidney disease  -Currently CBG controlled through her diet. This will need to be monitored closely by her PCP. -Recommend that if medication needed in the future insulin be used secondary to patient's poor renal function.  Subclinical Hypothyroidism:  -TSH was 17.69 on 12/4 with a FT4 of 0.78 at this point question if this week. Be playing a role in patients chest pain complaints and Abdominal Pain/ N/V . -Synthroid 50 g daily -Recheck TSH and 6-8 weeks by PCP  Chronic kidney disease stage IV: ( baseline creatinine 2.2-2.5) -Stable. Patient with a creatinine of 2.43 on admission   -Currently at baseline   Anxiety - Xanax 0.5 mg TID PRN  -Continue Paxil 10 mg daily, patient's PCP to adjust  HLD -Continue atorvastatin 40 mg daily  Hypokalemia  -Potassium goal>4 -Potassium 50 mEq prior to discharge  Noncompliance with medication -Have had a talk with patient each day on the importance of taking medication as prescribed and following up with her outside physicians.     Discharge Diagnoses:  Active Problems:   HTN (hypertension)   HLD (hyperlipidemia)   AICD (automatic cardioverter/defibrillator) present, dual chamber medtronic   Cardiomyopathy, ischemic-EF 35-40%   CAD S/P prior RCA PCI, cath 05/03/2013- med Rx, STEMI 01/2015 - 100% prox PDA, treated medically   Abnormal TSH   Acute respiratory failure with hypoxia (HCC)   Anxiousness   Chest pain   Angina at rest [facility])   Diabetes mellitus type 2 in obese (HCC)   Acute bronchitis   GERD (gastroesophageal reflux disease)   Acute pulmonary edema (HCC)   Acute on chronic combined systolic and diastolic congestive heart failure, NYHA class 4 (HCC)   Acute on chronic respiratory  failure with hypoxia and hypercapnia (HCC)   Respiratory failure (HCC)   Acute respiratory failure with  hypoxemia (HCC)   Paroxysmal atrial fibrillation (HCC)   Essential hypertension   Hematemesis with nausea   Hemoptysis   Nausea with vomiting   Other specified hypothyroidism   Chronic kidney disease (CKD), stage IV (severe) (HCC)   Anxiety state   CAD in native artery   Hypokalemia   Persistent atrial fibrillation (HCC)   Abdominal pain, left upper quadrant   Generalized abdominal pain   Gastroesophageal reflux disease with esophagitis   Hiatal hernia   Controlled type 2 diabetes mellitus with stage 4 chronic kidney disease ([place])   Noncompliance with medication regimen   Discharge Condition: Stable   Diet recommendation: Heart healthy/American diabetic Association  Filed Weights   03/06/15 2119 03/07/15 0400 03/08/15 0440  Weight: 92.3 kg (203 lb 7.8 oz) 92.1 kg (203 lb 0.7 oz) 90 kg (198 lb 6.6 oz)    History of present illness:  64 yo BF PMHx ischemic Cardiomyopathy, Cchronic Systolic CHF last EF [id number] in 01/2015, CKD, DM Type 2, CAD, STEMI in November 2016-treated medically at [facility] with a cardiac catheterization showing 100% stenosis of the proximal PDA;   Presents with continued chest pain and reports of coughing up blood. Patient was just admitted on on 12/7 for similar complaints. Patient reports taking all medications as advised but continues to have chest pain. She reported called her cardiologist's office for advice on what to do about her chest pain complaints, but was advised to come into the hospital. Patient reports that she's still intermittently having substernal chest pain that is a 10 out of 10 and sharp. Associated symptoms include, hemoptysis, difficulty breathing, nausea, and anxiety. Patient reports being unable to lie flat as it feels as though she suffocating. Prior to coming in today she states that she tried a nitroglycerin once and was also given 1 by EMS prior to arrival. Nitroglycerin helps relieve pain for a short period in time  and then it returns. Patient notes symptoms of hemoptysis first occurred during her ICU stay at [facility]. At that time patient noted coughing up clots of blood. It seems these symptoms have now improved some and only report streaks mixed with sputum production. Upon arrival hemoglobin and hematocrit appear to be similar to appears to be patient's baseline. During his hospitalization patient was treated for cardiomyopathy, paroxysmal atrial fibrillation, and noncompliance with medication. After medication adjustments patient's symptoms improved. Again it was emphasized to patient that she had an extensively damaged heart and that compliant with medication was necessary to avoid multiple hospitalizations in the future. In addition patient's oral hypoglycemic medications were stopped secondary to her poor renal function. Emphasized to patient that she will need to continue to control through diet, as continuing to take these medications with heart failure and renal failure are contraindicated. If medication is indicated in the future secondary to rising hemoglobin A1c insulin would be the recommended agent.    Consultants: Dr.Peter Tommy [place]water cardiology  Procedure/Significant Events: 12/16 CT abdomen pelvis WO contrast;-No acute findings identified within the abdomen or pelvis. -Right pleural effusion.- Bilateral renal cyst.-Colonic diverticulosis without acute inflammation.      Discharge Exam: Filed Vitals:   03/12/15 0356 03/12/15 0400 03/12/15 0800 03/12/15 1239  BP: 115/69 115/69 122/73 111/74  Pulse: 64 64 68 65  Temp: 98.6 F (37 C)  98.5 F (36.9 C) 97.7 F (36.5 C)  TempSrc: Oral  Oral  Oral  Resp: 26 25 24 31   Height:      Weight:      SpO2: 95% 97% 98% 97%    General: A/O x 4 , negative Abd Pain, sitting in chair comfortably, No acute respiratory distress Eyes: Negative headache, eye pain, double vision,negative scleral hemorrhage ENT: Negative Runny nose, negative  ear pain, negative gingival bleeding, Neck: Negative scars, masses, torticollis, lymphadenopathy, JVD Lungs: Clear to auscultation bilaterally without wheezes or crackles Cardiovascular: Irregular irregular rhythm and rate, without murmur gallop or rub normal S1 and S2 Abdomen: Negative abdominal pain to palpation, nondistended, positive soft, bowel sounds, no rebound, no ascites, no appreciable mass   Discharge Instructions     Medication List    STOP taking these medications        furosemide 40 MG tablet  Commonly known as:  LASIX     glipiZIDE 2.5 MG 24 hr tablet  Commonly known as:  GLUCOTROL XL     hydrALAZINE 10 MG tablet  Commonly known as:  APRESOLINE     ranolazine 500 MG 12 hr tablet  Commonly known as:  RANEXA      TAKE these medications        ALPRAZolam 0.25 MG tablet  Commonly known as:  XANAX  Take 1-2 tablets (0.25-0.5 mg total) by mouth 3 (three) times daily as needed for anxiety.     amiodarone 200 MG tablet  Commonly known as:  PACERONE  Take 1 tablet (200 mg total) by mouth 2 (two) times daily.     aspirin EC 81 MG tablet  Take 81 mg by mouth daily.     atorvastatin 40 MG tablet  Commonly known as:  LIPITOR  Take 1 tablet (40 mg total) by mouth daily at 6 PM.     carvedilol 6.25 MG tablet  Commonly known as:  COREG  Take 1.5 tablets (9.375 mg total) by mouth 2 (two) times daily with a meal.     clopidogrel 75 MG tablet  Commonly known as:  PLAVIX  Take 75 mg by mouth at bedtime.     isosorbide mononitrate 60 MG 24 hr tablet  Commonly known as:  IMDUR  Take 1 tablet (60 mg total) by mouth daily.     levothyroxine 50 MCG tablet  Commonly known as:  SYNTHROID, LEVOTHROID  Take 1 tablet (50 mcg total) by mouth daily before breakfast.     MULTI VITAMIN DAILY PO  Take 1 tablet by mouth daily.     nitroGLYCERIN 0.4 MG SL tablet  Commonly known as:  NITROSTAT  Place 1 tablet (0.4 mg total) under the tongue every 5 (five) minutes as  needed for chest pain.     pantoprazole 40 MG tablet  Commonly known as:  PROTONIX  Take 1 tablet (40 mg total) by mouth 2 (two) times daily.     PARoxetine 10 MG tablet  Commonly known as:  PAXIL  Take 1 tablet (10 mg total) by mouth daily.     sucralfate 1 GM/10ML suspension  Commonly known as:  CARAFATE  Take 10 mLs (1 g total) by mouth 4 (four) times daily -  with meals and at bedtime.     torsemide 20 MG tablet  Commonly known as:  DEMADEX  Take 1 tablet (20 mg total) by mouth daily.     traMADol 50 MG tablet  Commonly known as:  ULTRAM  Take 1 tablet (50 mg total) by mouth every 12 (twelve) hours as needed  for moderate pain or severe pain.       Allergies  Allergen Reactions  . Flagyl [Metronidazole] Swelling and Rash    Face swells   . Contrast Media [Iodinated Diagnostic Agents] Rash  . Ioxaglate Rash  . Potassium Sulfate Rash and Other (See Comments)    Headaches  . Potassium-Containing Compounds Other (See Comments)    Headaches-- reports no problems now   Follow-up Information    Follow up with Thompson Grayer, MD. Schedule an appointment as soon as possible for a visit in 2 weeks.   Specialty:  Cardiology   Why:  Schedule appointment for Follow-up with Dr.James Allred cardiologist in 1-2 weeks   Contact information:   [place] [address] [phone]       Follow up with ROBERTSON, ANTHONY T, PA-C. Schedule an appointment as soon as possible for a visit in 2 weeks.   Specialty:  Physician Assistant   Why:  Schedule appointment to Follow-up with PA Elberta Fortis ROBERTSON,(PCP) in 2 weeks   Contact information:   [address] [place] [address] [phone]        The results of significant diagnostics from this hospitalization (including imaging, microbiology, ancillary and laboratory) are listed below for reference.    Significant Diagnostic Studies: Ct Abdomen Pelvis Wo Contrast  03/10/2015  CLINICAL DATA:   Abdominal pain with nausea and vomiting. EXAM: CT ABDOMEN AND PELVIS WITHOUT CONTRAST TECHNIQUE: Multidetector CT imaging of the abdomen and pelvis was performed following the standard protocol without IV contrast. COMPARISON:  03/08/2015. FINDINGS: Lower chest: There is a small right pleural effusion. Atelectasis is noted overlying the left lower lobe. Heart size is enlarged. There is calcifications within the RCA, LAD and left circumflex coronary arteries. Hepatobiliary: No focal liver abnormality identified. No biliary dilatation. Increased attenuation within the gallbladder are noted which may represent gallbladder sludge. No gallbladder wall thickening noted. Pancreas: The pancreas is unremarkable. Spleen: Negative. Adrenals/Urinary Tract: The adrenal glands are negative. Bilateral renal cysts are identified. These are incompletely characterized without IV contrast. There is asymmetric left renal atrophy. No kidney stone or evidence of hydronephrosis identified. The urinary bladder appears normal for degree of distention. Stomach/Bowel: The stomach is normal. The small bowel loops are within normal limits. No dilated loops of small bowel noted. The appendix is visualized and is normal. Unremarkable appearance of the proximal colon. Multiple distal colonic diverticula noted without acute inflammation. Vascular/Lymphatic: Aortic atherosclerosis noted. No enlarged retroperitoneal or mesenteric adenopathy. No enlarged pelvic or inguinal lymph nodes. Reproductive: Uterus and adnexal structures are unremarkable. Other: There is no free fluid identified.  No fluid collections. Musculoskeletal: Unremarkable. IMPRESSION: 1. No acute findings identified within the abdomen or pelvis. 2. Right pleural effusion. 3. Aortic atherosclerosis 4. Bilateral renal cyst. 5. Colonic diverticulosis without acute inflammation. Electronically Signed   By: Kerby Moors M.D.   On: 03/10/2015 18:29   Ct Abdomen Pelvis Wo  Contrast  02/26/2015  CLINICAL DATA:  Chronic mid epigastric abdominal pain, worse after eating. Initial encounter. EXAM: CT ABDOMEN AND PELVIS WITHOUT CONTRAST TECHNIQUE: Multidetector CT imaging of the abdomen and pelvis was performed following the standard protocol without IV contrast. COMPARISON:  CT of the abdomen and pelvis performed 06/15/2014, and abdominal ultrasound performed 02/25/2015 FINDINGS: Mild bibasilar scarring is noted at the lung bases. Pacemaker leads are partially imaged. Diffuse coronary artery calcifications are seen. Trace pericardial fluid remains within normal limits. The liver and spleen are unremarkable in appearance.  The gallbladder is within normal limits. The pancreas and adrenal glands are unremarkable. Scattered bilateral renal cysts are seen, measuring up to 5.4 cm in size. The kidneys are otherwise unremarkable. There is no evidence of hydronephrosis. No renal or ureteral stones are seen. No perinephric stranding is appreciated. No free fluid is identified. The small bowel is unremarkable in appearance. The stomach is within normal limits. No acute vascular abnormalities are seen. Relatively diffuse calcification is seen along the abdominal aorta and its branches. Vascular stents are noted at the proximal renal arteries bilaterally. The appendix is normal in caliber, without evidence of appendicitis. Scattered diverticulosis is noted along the descending and proximal sigmoid colon, without evidence of diverticulitis. The bladder is mildly distended and grossly unremarkable. The uterus is unremarkable in appearance. The ovaries are grossly symmetric. No suspicious adnexal masses are seen. No inguinal lymphadenopathy is seen. No acute osseous abnormalities are identified. Facet disease is noted at the lower lumbar spine. IMPRESSION: 1. No acute abnormality seen to explain the patient's symptoms. 2. Scattered bilateral renal cysts seen. 3. Relatively diffuse calcification along the  abdominal aorta and its branches. 4. Diffuse coronary artery calcifications seen. 5. Scattered diverticulosis along the descending and proximal sigmoid colon, without evidence of diverticulitis. Electronically Signed   By: Garald Balding M.D.   On: 02/26/2015 19:45   Dg Chest 2 View  03/03/2015  CLINICAL DATA:  Central chest pain and shortness of breath for 3 hours. EXAM: CHEST  2 VIEW COMPARISON:  Single AP view 03/01/2015 FINDINGS: Dual lead left-sided pacemaker remains in place. Improving bibasilar aeration from prior exam. Minimal residual right perihilar opacity persists. Cardiomediastinal contours are unchanged. No pleural effusion or pneumothorax. No acute osseous abnormalities. IMPRESSION: Improving bibasilar aeration. Minimal residual right perihilar opacity, may reflect resolving edema versus atelectasis. Exam is otherwise unchanged. Electronically Signed   By: Jeb Levering M.D.   On: 03/03/2015 18:06   Dg Chest 2 View  02/22/2015  CLINICAL DATA:  Shortness of breath today, myocardial infarction 10 days ago EXAM: CHEST  2 VIEW COMPARISON:  02/20/2015 FINDINGS: Mild cardiac enlargement stable. Aortic arch calcifications stable. Two lead cardiac pacer unchanged. Vascular pattern within normal limits. No significant consolidation. Possible trace left pleural effusion. IMPRESSION: No significant acute abnormalities. Electronically Signed   By: Skipper Cliche M.D.   On: 02/22/2015 11:53   Dg Chest 2 View  02/20/2015  CLINICAL DATA:  Chest pain for 1 day.  Dyspnea. EXAM: CHEST  2 VIEW COMPARISON:  06/29/2014. FINDINGS: The cardiac silhouette is mildly enlarged and the aorta remains tortuous and partially calcified. The interstitial markings are mildly prominent with mild progression. Normal vascularity. No pleural fluid. Minimal thoracic spine degenerative changes. Stable left subclavian pacer and AICD leads. IMPRESSION: Interval mild cardiomegaly and mildly prominent interstitial markings. The  interstitial prominence could be due to viral pneumonitis or minimal interstitial pulmonary edema. Electronically Signed   By: Claudie Revering M.D.   On: 02/20/2015 17:44   US Abdomen Complete  02/25/2015  CLINICAL DATA:  Abdominal pain EXAM: ULTRASOUND ABDOMEN COMPLETE COMPARISON:  06/15/2014 abdominal CT.  Renal ultrasound 01/03/2015 FINDINGS: Gallbladder: No gallstones or wall thickening visualized. No sonographic Murphy sign noted. Common bile duct: Diameter: 2 mm Liver: No focal lesion identified. Within normal limits in parenchymal echogenicity. Antegrade flow in the imaged portal venous system. IVC: No abnormality visualized. Pancreas: Visualized portion unremarkable. Spleen: Size and appearance within normal limits. Right Kidney: Length: 10.4 cm. Echogenicity within normal limits. No mass or hydronephrosis  visualized. Two renal cysts up to 6 cm Left Kidney: Length: 9 cm with smooth cortex. No hydronephrosis or solid mass. 2 renal cysts up to 18 mm Abdominal aorta: Extensive atherosclerosis.  No aneurysm detected. Other findings: None. IMPRESSION: 1. No explanation for acute abdominal pain. 2. Left renal atrophy, likely from chronic renal arterial stenosis given appearance and left renal artery stenting. Electronically Signed   By: Monte Fantasia M.D.   On: 02/25/2015 14:55   Nm Pulmonary Perf And Vent  02/23/2015  CLINICAL DATA:  Short of breath following cardiac catheterization. Chest pain. EXAM: NUCLEAR MEDICINE VENTILATION - PERFUSION LUNG SCAN TECHNIQUE: Ventilation images were obtained in multiple projections using inhaled aerosol Tc-86m DTPA. Perfusion images were obtained in multiple projections after intravenous injection of Tc-39m MAA. RADIOPHARMACEUTICALS:  31.3 Technetium-19m DTPA aerosol inhalation and 4.1 Technetium-70m MAA IV COMPARISON:  Radiograph 02/22/2015 FINDINGS: Ventilation: No focal ventilation defect. Perfusion: No wedge shaped peripheral perfusion defects to suggest acute  pulmonary embolism. IMPRESSION: No evidence of pulmonary embolism. Electronically Signed   By: Suzy Bouchard M.D.   On: 02/23/2015 14:28   Dg Chest Port 1 View  03/08/2015  CLINICAL DATA:  Respiratory failure. EXAM: PORTABLE CHEST 1 VIEW COMPARISON:  03/07/2015. FINDINGS: Right IJ line in stable position. Cardiac pacer noted with lead tips in right atrium and right ventricle. Stable cardiomegaly. Interim slight clearing of bilateral pulmonary alveolar infiltrates/edema. No pleural effusion or pneumothorax. IMPRESSION: 1. Right IJ line stable position. 2. Cardiac pacer stable position. Stable cardiomegaly . Interim slight clearing of bilateral pulmonary infiltrates/edema. Electronically Signed   By: [place]   On: 03/08/2015 07:12   Dg Chest Port 1 View  03/07/2015  CLINICAL DATA:  Respiratory failure. EXAM: PORTABLE CHEST 1 VIEW COMPARISON:  03/07/2015. FINDINGS: Right IJ line in stable position. Cardiac pacer with lead tips in right atrium right ventricle. Cardiomegaly with bilateral pulmonary infiltrates consistent with pulmonary edema. Bilateral pneumonia cannot be excluded. No pleural effusion or pneumothorax. IMPRESSION: 1. Right IJ line in stable position. 2. Cardiac pacer stable position. Persistent prominent cardiomegaly. 3. Persistent unchanged bilateral pulmonary infiltrates consistent with bilateral pulmonary edema and/or pneumonia. Electronically Signed   By: [place]   On: 03/07/2015 07:13   Dg Chest Port 1 View  03/07/2015  CLINICAL DATA:  Central line placement. EXAM: PORTABLE CHEST 1 VIEW COMPARISON:  03/06/2015 FINDINGS: Cardiac pacemaker. Interval placement of a right central venous catheter. Tip overlies the cavoatrial junction. Shallow inspiration. Persistent bilateral perihilar infiltrates, greater on the right. No pneumothorax. No blunting of costophrenic angles. Cardiac enlargement. IMPRESSION: Right central venous catheter with tip over the cavoatrial junction.  Bilateral pulmonary airspace disease. Electronically Signed   By: Lucienne Capers M.D.   On: 03/07/2015 01:26   Dg Chest Port 1 View  03/06/2015  CLINICAL DATA:  Sudden onset shortness of breath with altered mental status tonight. Respiratory distress. EXAM: PORTABLE CHEST 1 VIEW COMPARISON:  03/03/2015 FINDINGS: Cardiac pacemaker. Shallow inspiration. Mild cardiac enlargement. There is bilateral perihilar airspace disease, greater on the right. This may be due to pulmonary edema or pneumonia. No blunting of costophrenic angles. No pneumothorax. Calcified and tortuous aorta. IMPRESSION: Bilateral perihilar airspace disease may indicate pulmonary edema or pneumonia. Electronically Signed   By: Lucienne Capers M.D.   On: 03/06/2015 20:27   Dg Chest Portable 1 View  03/01/2015  CLINICAL DATA:  64 year old female with shortness of breath and chest pain radiating from the sternum to the left side. Myocardial infarction 2 weeks  ago. Former smoker. Initial encounter. EXAM: PORTABLE CHEST 1 VIEW COMPARISON:  02/22/2015 and earlier. FINDINGS: Portable AP upright view at 0846 hours. Increased basilar predominant interstitial opacity since the prior. No pneumothorax or pleural effusion. Mildly lower lung volumes. No consolidation. Stable cardiomegaly and mediastinal contours. Stable left chest cardiac AICD. Visualized tracheal air column is within normal limits. IMPRESSION: Increased basilar predominant interstitial opacity, favor acute interstitial edema. No other acute cardiopulmonary abnormality. Electronically Signed   By: Genevie [name] M.D.   On: 03/01/2015 09:10   Dg Abd Portable 1v  03/08/2015  CLINICAL DATA:  Abdominal pain with nausea and vomiting EXAM: PORTABLE ABDOMEN - 1 VIEW COMPARISON:  February 26, 2015 CT abdomen and pelvis FINDINGS: There is moderate stool in the colon. There is no bowel dilatation or air-fluid level suggesting obstruction. No free air. There is vascular calcification in the pelvis. Lung  bases are clear. Pacemaker leads are attached to the right atrium and right ventricle. IMPRESSION: Bowel gas pattern unremarkable. No obstruction or free air. Lung bases clear. Electronically Signed   By: Lowella Grip III M.D.   On: 03/08/2015 19:36    Microbiology: Recent Results (from the past 240 hour(s))  MRSA PCR Screening     Status: None   Collection Time: 03/07/15  2:25 AM  Result Value Ref Range Status   MRSA by PCR NEGATIVE NEGATIVE Final    Comment:        The GeneXpert MRSA Assay (FDA approved for NASAL specimens only), is one component of a comprehensive MRSA colonization surveillance program. It is not intended to diagnose MRSA infection nor to guide or monitor treatment for MRSA infections.      Labs: Basic Metabolic Panel:  Recent Labs Lab 03/07/15 0442 03/08/15 0452 03/09/15 0444 03/10/15 0630 03/11/15 0540 03/11/15 0700 03/11/15 1734 03/12/15 0417  NA 135 133* 133* 137 136  --   --  135  K 4.3 3.5 3.6 3.3* 3.3*  --  3.8 3.5  CL 97* 96* 97* 100* 97*  --   --  100*  CO2 29 30 29 29 29   --   --  29  GLUCOSE 103* 87 127* 96 92  --   --  79  BUN 26* 27* 26* 21* 19  --   --  18  CREATININE 2.38* 2.38* 2.25* 2.43* 2.24*  --   --  2.42*  CALCIUM 8.6* 8.4* 8.4* 8.3* 8.6*  --   --  8.4*  MG 1.9 2.2 2.1 1.7 7.5* 2.1 2.0 1.9  PHOS 4.1 3.6 3.2  --   --   --   --   --    Liver Function Tests:  Recent Labs Lab 03/10/15 0630 03/11/15 0540 03/12/15 0417  AST 123* 97* 89*  ALT 153* 132* 122*  ALKPHOS 51 55 52  BILITOT 0.6 0.6 1.0  PROT 5.7* 5.9* 5.5*  ALBUMIN 2.3* 2.3* 2.3*    Recent Labs Lab 03/08/15 2211  LIPASE 31  AMYLASE 64   No results for input(s): AMMONIA in the last 168 hours. CBC:  Recent Labs Lab 03/08/15 0452 03/09/15 0930 03/10/15 0630 03/11/15 0540 03/12/15 0417  WBC 5.5 5.7 5.2 4.7 4.7  NEUTROABS  --  4.3 3.6 2.9 3.0  HGB 9.3* 9.4* 9.8* 10.0* 9.5*  HCT 28.7* 28.5* 29.0* 29.8* 28.2*  MCV 93.2 92.5 92.9 92.8 92.2  PLT  197 198 216 225 209   Cardiac Enzymes:  Recent Labs Lab 03/07/15 1445 03/07/15 2232 03/08/15 1615 03/08/15  2211 03/09/15 0444  TROPONINI 0.06* 0.06* 0.06* 0.05* 0.05*   BNP: BNP (last 3 results)  Recent Labs  02/20/15 1645 02/21/15 1042 03/01/15 0839  BNP 463.9* 386.1* 452.0*    ProBNP (last 3 results) No results for input(s): PROBNP in the last 8760 hours.  CBG:  Recent Labs Lab 03/11/15 2007 03/11/15 2349 03/12/15 0400 03/12/15 0809 03/12/15 1245  GLUCAP 147* 68 75 84 93       Signed:  Dia Crawford, MD [facility] [phone] pager

## 2015-03-12 NOTE — Progress Notes (Addendum)
SUBJECTIVE:  No chest pain.  No SOB   PHYSICAL EXAM Filed Vitals:   03/12/15 0000 03/12/15 0356 03/12/15 0400 03/12/15 0800  BP: 102/61 115/69 115/69 122/73  Pulse: 68 64 64 68  Temp:  98.6 F (37 C)  98.5 F (36.9 C)  TempSrc:  Oral  Oral  Resp: 22 26 25 24   Height:      Weight:      SpO2: 97% 95% 97% 98%   General:  No distress Lungs:  Clear Heart:  Irregular Abdomen:  Positive bowel sounds, no rebound no guarding Extremities:  No edema   LABS:  Results for orders placed or performed during the hospital encounter of 03/03/15 (from the past 24 hour(s))  Glucose, capillary     Status: None   Collection Time: 03/11/15 12:03 PM  Result Value Ref Range   Glucose-Capillary 99 65 - 99 mg/dL  Glucose, capillary     Status: Abnormal   Collection Time: 03/11/15  4:31 PM  Result Value Ref Range   Glucose-Capillary 123 (H) 65 - 99 mg/dL  Magnesium     Status: None   Collection Time: 03/11/15  5:34 PM  Result Value Ref Range   Magnesium 2.0 1.7 - 2.4 mg/dL  Potassium     Status: None   Collection Time: 03/11/15  5:34 PM  Result Value Ref Range   Potassium 3.8 3.5 - 5.1 mmol/L  Glucose, capillary     Status: Abnormal   Collection Time: 03/11/15  8:07 PM  Result Value Ref Range   Glucose-Capillary 147 (H) 65 - 99 mg/dL   Comment 1 Capillary Specimen   Glucose, capillary     Status: None   Collection Time: 03/11/15 11:49 PM  Result Value Ref Range   Glucose-Capillary 68 65 - 99 mg/dL   Comment 1 Capillary Specimen   Glucose, capillary     Status: None   Collection Time: 03/12/15  4:00 AM  Result Value Ref Range   Glucose-Capillary 75 65 - 99 mg/dL   Comment 1 Capillary Specimen   Comprehensive metabolic panel     Status: Abnormal   Collection Time: 03/12/15  4:17 AM  Result Value Ref Range   Sodium 135 135 - 145 mmol/L   Potassium 3.5 3.5 - 5.1 mmol/L   Chloride 100 (L) 101 - 111 mmol/L   CO2 29 22 - 32 mmol/L   Glucose, Bld 79 65 - 99 mg/dL   BUN 18 6 - 20  mg/dL   Creatinine, Ser 2.42 (H) 0.44 - 1.00 mg/dL   Calcium 8.4 (L) 8.9 - 10.3 mg/dL   Total Protein 5.5 (L) 6.5 - 8.1 g/dL   Albumin 2.3 (L) 3.5 - 5.0 g/dL   AST 89 (H) 15 - 41 U/L   ALT 122 (H) 14 - 54 U/L   Alkaline Phosphatase 52 38 - 126 U/L   Total Bilirubin 1.0 0.3 - 1.2 mg/dL   GFR calc non Af Amer 20 (L) >60 mL/min   GFR calc Af Amer 23 (L) >60 mL/min   Anion gap 6 5 - 15  Magnesium     Status: None   Collection Time: 03/12/15  4:17 AM  Result Value Ref Range   Magnesium 1.9 1.7 - 2.4 mg/dL  CBC with Differential/Platelet     Status: Abnormal   Collection Time: 03/12/15  4:17 AM  Result Value Ref Range   WBC 4.7 4.0 - 10.5 K/uL   RBC 3.06 (L) 3.87 - 5.11  MIL/uL   Hemoglobin 9.5 (L) 12.0 - 15.0 g/dL   HCT 28.2 (L) 36.0 - 46.0 %   MCV 92.2 78.0 - 100.0 fL   MCH 31.0 26.0 - 34.0 pg   MCHC 33.7 30.0 - 36.0 g/dL   RDW 16.1 (H) 11.5 - 15.5 %   Platelets 209 150 - 400 K/uL   Neutrophils Relative % 65 %   Neutro Abs 3.0 1.7 - 7.7 K/uL   Lymphocytes Relative 16 %   Lymphs Abs 0.8 0.7 - 4.0 K/uL   Monocytes Relative 12 %   Monocytes Absolute 0.6 0.1 - 1.0 K/uL   Eosinophils Relative 7 %   Eosinophils Absolute 0.3 0.0 - 0.7 K/uL   Basophils Relative 0 %   Basophils Absolute 0.0 0.0 - 0.1 K/uL  Glucose, capillary     Status: None   Collection Time: 03/12/15  8:09 AM  Result Value Ref Range   Glucose-Capillary 84 65 - 99 mg/dL    Intake/Output Summary (Last 24 hours) at 03/12/15 1013 Last data filed at 03/12/15 0830  Gross per 24 hour  Intake 1162.27 ml  Output    500 ml  Net 662.27 ml    ASSESSMENT AND PLAN:  CAD:  Medical management as outlined by Dr. Johnsie Cancel.  I will switch from Isosorbide dinitrate to mononitrate.  Otherwise no new suggestions.    CHRONIC SYSTOLIC HF:  Continue current meds.  TEE if she has any further decompensation to further assess MR.    ATRIAL FIB:    Not an anticoagulation candidate at this point.  Rates OK.  Switched to PO amiodarone  yesterday.  Continue current dosing.    CKD:  Creat is up slightly .   However, for now I would continue the Torsemide as scheduled.     Jeneen Rinks [facility] 03/12/2015 10:13 AM

## 2015-03-13 ENCOUNTER — Telehealth: Payer: Self-pay

## 2015-03-13 NOTE — Progress Notes (Signed)
[facility] can accept referral for [facility] RN/PT and aide. Faxed F2F, and dc summary to [facility], # [phone]. Jonnie Finner RN CCM Case Mgmt phone [phone]

## 2015-03-13 NOTE — Telephone Encounter (Signed)
Telephone call to patient.  She reported she was discharged from hospital yesterday, 03/12/2015.   She reported she is expecting home health and has discharge instructions.  Advised to schedule post hospital physician appointments within 2 weeks per discharge instructions.  She confirmed she has her meds.  Requested she send a remote transmission today to review fluid levels.  She stated she will to that later today.  I stated I would call her back after receiving the report.

## 2015-03-14 ENCOUNTER — Ambulatory Visit (INDEPENDENT_AMBULATORY_CARE_PROVIDER_SITE_OTHER): Payer: Medicare Other

## 2015-03-14 DIAGNOSIS — Z95810 Presence of automatic (implantable) cardiac defibrillator: Secondary | ICD-10-CM

## 2015-03-14 DIAGNOSIS — I5023 Acute on chronic systolic (congestive) heart failure: Secondary | ICD-10-CM

## 2015-03-14 NOTE — Progress Notes (Signed)
EPIC Encounter for ICM Monitoring  Patient Name: [name] Terry is a 64 y.o. female Date: 03/14/2015 Primary Care Physican: No primary care provider on file. Primary Cardiologist: Johnsie Cancel Electrophysiologist: Allred Dry Weight: Has not weighed since she has been home      In the past month, have you:  1. Gained more than 2 pounds in a day or more than 5 pounds in a week? Unknown.  Encouraged her to resume daily weights to monitor for fluid retention  2. Had changes in your medications (with verification of current medications)? She did not have the list of all her discharge instructions in front of her to review but will review with home health nurse today.   3. Had more shortness of breath than is usual for you? Yes  4. Limited your activity because of shortness of breath? no  5. Not been able to sleep because of shortness of breath? no  6. Had increased swelling in your feet or ankles? no  7. Had symptoms of dehydration (dizziness, dry mouth, increased thirst, decreased urine output) no  8. Had changes in sodium restriction? no  9. Been compliant with medication? Per discharge note from 03/12/2015, patient has not been compliant with taking medications.    ICM trend: 03/14/2015 - 3 month view   ICM trend:  1 year  Follow-up plan: ICM clinic phone appointment on 03/29/2015.  Optivol below baseline during recent hospitalization suggesting fluid retention.  Thoracic impedance trending back toward baseline today.  She was discharged on 03/12/2015 but not feeling well today.  She stated she has called to make an appointment with PCP and waiting on call back.  She stated her stomach has started hurting some today and she is a little [name] Terry of breath which is new today.  Home health nurse has contacted her an scheduled a visit.  She reported she is not sure if she should see a physician today due to her stomach not feeling well and some shortness of breath.  Advised would talk with Dr  [name] Terry nurse practitioner and call her back.  Discussed with [name] Marshall, NP and she advised patient should see her PCP.    Attempted call back to patient to advise to call her PCP and inform them of her symptoms so she can be seen as soon as possible.  Left message for return call.   Patient returned call.  Advised to call PCP office to inform them of her symptoms and ask for appointment asap.  Home health nurse will be visiting within the hour.  She stated the fluid pill Furosemide was not on her discharge instructions and she thought she needed one.  Advised the medication Torsemide is the fluid pill which is list on her discharge instructions.  She stated she was getting ready to take that medication and she has been compliant with all of her meds today.  Advised if her symptoms worsen to use 911 or emergency department.   Copy of note sent to patient's primary care physician, primary cardiologist, and device following physician.  [name] Billings, RN, CCM 03/14/2015 12:12 PM

## 2015-03-22 ENCOUNTER — Encounter: Payer: Medicare Other | Admitting: Cardiology

## 2015-03-23 ENCOUNTER — Encounter: Payer: Self-pay | Admitting: Adult Health

## 2015-03-23 ENCOUNTER — Encounter: Payer: Medicare Other | Admitting: Adult Health

## 2015-03-23 NOTE — Progress Notes (Signed)
Cardiology Office Note   ERROR No show

## 2015-03-30 ENCOUNTER — Encounter (HOSPITAL_COMMUNITY): Payer: Self-pay

## 2015-03-30 ENCOUNTER — Inpatient Hospital Stay (HOSPITAL_COMMUNITY)
Admission: EM | Admit: 2015-03-30 | Discharge: 2015-04-08 | DRG: 378 | Disposition: A | Discharge status: Home Health | Payer: Medicare Other | Source: Ambulatory Visit | Attending: Internal Medicine | Admitting: Internal Medicine

## 2015-03-30 DIAGNOSIS — Z7902 Long term (current) use of antithrombotics/antiplatelets: Secondary | ICD-10-CM

## 2015-03-30 DIAGNOSIS — Z8673 Personal history of transient ischemic attack (TIA), and cerebral infarction without residual deficits: Secondary | ICD-10-CM

## 2015-03-30 DIAGNOSIS — K648 Other hemorrhoids: Secondary | ICD-10-CM | POA: Diagnosis present

## 2015-03-30 DIAGNOSIS — Z95810 Presence of automatic (implantable) cardiac defibrillator: Secondary | ICD-10-CM | POA: Diagnosis not present

## 2015-03-30 DIAGNOSIS — I480 Paroxysmal atrial fibrillation: Secondary | ICD-10-CM | POA: Diagnosis present

## 2015-03-30 DIAGNOSIS — I824Z1 Acute embolism and thrombosis of unspecified deep veins of right distal lower extremity: Secondary | ICD-10-CM | POA: Diagnosis present

## 2015-03-30 DIAGNOSIS — Z883 Allergy status to other anti-infective agents status: Secondary | ICD-10-CM

## 2015-03-30 DIAGNOSIS — E039 Hypothyroidism, unspecified: Secondary | ICD-10-CM | POA: Diagnosis present

## 2015-03-30 DIAGNOSIS — Z7982 Long term (current) use of aspirin: Secondary | ICD-10-CM | POA: Diagnosis not present

## 2015-03-30 DIAGNOSIS — D62 Acute posthemorrhagic anemia: Secondary | ICD-10-CM | POA: Diagnosis not present

## 2015-03-30 DIAGNOSIS — N184 Chronic kidney disease, stage 4 (severe): Secondary | ICD-10-CM | POA: Diagnosis present

## 2015-03-30 DIAGNOSIS — E119 Type 2 diabetes mellitus without complications: Secondary | ICD-10-CM | POA: Diagnosis not present

## 2015-03-30 DIAGNOSIS — M353 Polymyalgia rheumatica: Secondary | ICD-10-CM | POA: Diagnosis present

## 2015-03-30 DIAGNOSIS — Z7901 Long term (current) use of anticoagulants: Secondary | ICD-10-CM

## 2015-03-30 DIAGNOSIS — I252 Old myocardial infarction: Secondary | ICD-10-CM | POA: Diagnosis not present

## 2015-03-30 DIAGNOSIS — I82409 Acute embolism and thrombosis of unspecified deep veins of unspecified lower extremity: Secondary | ICD-10-CM

## 2015-03-30 DIAGNOSIS — Z888 Allergy status to other drugs, medicaments and biological substances status: Secondary | ICD-10-CM

## 2015-03-30 DIAGNOSIS — Z8572 Personal history of non-Hodgkin lymphomas: Secondary | ICD-10-CM

## 2015-03-30 DIAGNOSIS — K5730 Diverticulosis of large intestine without perforation or abscess without bleeding: Secondary | ICD-10-CM | POA: Diagnosis present

## 2015-03-30 DIAGNOSIS — K921 Melena: Principal | ICD-10-CM | POA: Diagnosis present

## 2015-03-30 DIAGNOSIS — I509 Heart failure, unspecified: Secondary | ICD-10-CM

## 2015-03-30 DIAGNOSIS — R609 Edema, unspecified: Secondary | ICD-10-CM

## 2015-03-30 DIAGNOSIS — Z7984 Long term (current) use of oral hypoglycemic drugs: Secondary | ICD-10-CM | POA: Diagnosis not present

## 2015-03-30 DIAGNOSIS — E669 Obesity, unspecified: Secondary | ICD-10-CM | POA: Diagnosis present

## 2015-03-30 DIAGNOSIS — R042 Hemoptysis: Secondary | ICD-10-CM | POA: Diagnosis present

## 2015-03-30 DIAGNOSIS — I255 Ischemic cardiomyopathy: Secondary | ICD-10-CM | POA: Diagnosis present

## 2015-03-30 DIAGNOSIS — F411 Generalized anxiety disorder: Secondary | ICD-10-CM | POA: Diagnosis not present

## 2015-03-30 DIAGNOSIS — F329 Major depressive disorder, single episode, unspecified: Secondary | ICD-10-CM | POA: Diagnosis present

## 2015-03-30 DIAGNOSIS — Z91041 Radiographic dye allergy status: Secondary | ICD-10-CM

## 2015-03-30 DIAGNOSIS — R109 Unspecified abdominal pain: Secondary | ICD-10-CM | POA: Diagnosis present

## 2015-03-30 DIAGNOSIS — I130 Hypertensive heart and chronic kidney disease with heart failure and stage 1 through stage 4 chronic kidney disease, or unspecified chronic kidney disease: Secondary | ICD-10-CM | POA: Diagnosis present

## 2015-03-30 DIAGNOSIS — I824Y1 Acute embolism and thrombosis of unspecified deep veins of right proximal lower extremity: Secondary | ICD-10-CM | POA: Diagnosis present

## 2015-03-30 DIAGNOSIS — K219 Gastro-esophageal reflux disease without esophagitis: Secondary | ICD-10-CM | POA: Diagnosis present

## 2015-03-30 DIAGNOSIS — E1122 Type 2 diabetes mellitus with diabetic chronic kidney disease: Secondary | ICD-10-CM | POA: Diagnosis present

## 2015-03-30 DIAGNOSIS — K449 Diaphragmatic hernia without obstruction or gangrene: Secondary | ICD-10-CM | POA: Diagnosis present

## 2015-03-30 DIAGNOSIS — I10 Essential (primary) hypertension: Secondary | ICD-10-CM | POA: Diagnosis present

## 2015-03-30 DIAGNOSIS — R198 Other specified symptoms and signs involving the digestive system and abdomen: Secondary | ICD-10-CM

## 2015-03-30 DIAGNOSIS — I5022 Chronic systolic (congestive) heart failure: Secondary | ICD-10-CM | POA: Diagnosis present

## 2015-03-30 DIAGNOSIS — E1169 Type 2 diabetes mellitus with other specified complication: Secondary | ICD-10-CM

## 2015-03-30 DIAGNOSIS — K922 Gastrointestinal hemorrhage, unspecified: Secondary | ICD-10-CM | POA: Diagnosis present

## 2015-03-30 DIAGNOSIS — E785 Hyperlipidemia, unspecified: Secondary | ICD-10-CM | POA: Diagnosis present

## 2015-03-30 DIAGNOSIS — Z6831 Body mass index (BMI) 31.0-31.9, adult: Secondary | ICD-10-CM | POA: Diagnosis not present

## 2015-03-30 DIAGNOSIS — N183 Chronic kidney disease, stage 3 (moderate): Secondary | ICD-10-CM | POA: Diagnosis not present

## 2015-03-30 DIAGNOSIS — Z955 Presence of coronary angioplasty implant and graft: Secondary | ICD-10-CM | POA: Diagnosis not present

## 2015-03-30 DIAGNOSIS — Z87891 Personal history of nicotine dependence: Secondary | ICD-10-CM

## 2015-03-30 LAB — CBC WITH DIFFERENTIAL/PLATELET
Basophils Absolute: 0 10*3/uL (ref 0.0–0.1)
Basophils Relative: 0 %
EOS PCT: 0 %
Eosinophils Absolute: 0 10*3/uL (ref 0.0–0.7)
HCT: 27.2 % — ABNORMAL LOW (ref 36.0–46.0)
Hemoglobin: 9.5 g/dL — ABNORMAL LOW (ref 12.0–15.0)
LYMPHS ABS: 1.4 10*3/uL (ref 0.7–4.0)
LYMPHS PCT: 22 %
MCH: 34.7 pg — AB (ref 26.0–34.0)
MCHC: 34.9 g/dL (ref 30.0–36.0)
MCV: 99.3 fL (ref 78.0–100.0)
MONO ABS: 0.8 10*3/uL (ref 0.1–1.0)
Monocytes Relative: 13 %
Neutro Abs: 3.9 10*3/uL (ref 1.7–7.7)
Neutrophils Relative %: 65 %
PLATELETS: 235 10*3/uL (ref 150–400)
RBC: 2.74 MIL/uL — ABNORMAL LOW (ref 3.87–5.11)
RDW: 19.4 % — AB (ref 11.5–15.5)
WBC: 5.9 10*3/uL (ref 4.0–10.5)

## 2015-03-30 LAB — COMPREHENSIVE METABOLIC PANEL
ALT: 54 U/L (ref 14–54)
AST: 71 U/L — AB (ref 15–41)
Albumin: 3.6 g/dL (ref 3.5–5.0)
Alkaline Phosphatase: 68 U/L (ref 38–126)
Anion gap: 12 (ref 5–15)
BUN: 33 mg/dL — AB (ref 6–20)
CHLORIDE: 103 mmol/L (ref 101–111)
CO2: 28 mmol/L (ref 22–32)
CREATININE: 2.34 mg/dL — AB (ref 0.44–1.00)
Calcium: 9 mg/dL (ref 8.9–10.3)
GFR calc Af Amer: 24 mL/min — ABNORMAL LOW (ref >60)
GFR, EST NON AFRICAN AMERICAN: 21 mL/min — AB (ref >60)
Glucose, Bld: 112 mg/dL — ABNORMAL HIGH (ref 65–99)
Potassium: 3.1 mmol/L — ABNORMAL LOW (ref 3.5–5.1)
Sodium: 143 mmol/L (ref 135–145)
Total Bilirubin: 1 mg/dL (ref 0.3–1.2)
Total Protein: 7.6 g/dL (ref 6.5–8.1)

## 2015-03-30 LAB — ABO/RH: ABO/RH(D): A POS

## 2015-03-30 LAB — URINALYSIS, ROUTINE W REFLEX MICROSCOPIC
Bilirubin Urine: NEGATIVE
GLUCOSE, UA: NEGATIVE mg/dL
HGB URINE DIPSTICK: NEGATIVE
KETONES UR: NEGATIVE mg/dL
Leukocytes, UA: NEGATIVE
Nitrite: NEGATIVE
PROTEIN: NEGATIVE mg/dL
Specific Gravity, Urine: 1.011 (ref 1.005–1.030)
pH: 6.5 (ref 5.0–8.0)

## 2015-03-30 LAB — LIPASE, BLOOD: LIPASE: 61 U/L — AB (ref 11–51)

## 2015-03-30 LAB — PROTIME-INR
INR: 1.88 — AB (ref 0.00–1.49)
PROTHROMBIN TIME: 21.5 s — AB (ref 11.6–15.2)

## 2015-03-30 LAB — APTT: APTT: 33 s (ref 24–37)

## 2015-03-30 LAB — POC OCCULT BLOOD, ED: Fecal Occult Bld: NEGATIVE

## 2015-03-30 LAB — CBG MONITORING, ED: GLUCOSE-CAPILLARY: 81 mg/dL (ref 65–99)

## 2015-03-30 MED ORDER — POTASSIUM CHLORIDE CRYS ER 20 MEQ PO TBCR
40.0000 meq | EXTENDED_RELEASE_TABLET | Freq: Once | ORAL | Status: AC
  Administered 2015-03-30: 40 meq via ORAL
  Filled 2015-03-30: qty 2

## 2015-03-30 NOTE — ED Notes (Signed)
MD at bedside. 

## 2015-03-30 NOTE — ED Notes (Signed)
Zenia Resides, MD stated that it is ok for patient to take at home medications.

## 2015-03-30 NOTE — ED Provider Notes (Signed)
CSN: [id number]     Arrival date & time 03/30/15  1832 History   First MD Initiated Contact with Patient 03/30/15 1954     Chief Complaint  Patient presents with  . Abdominal Pain     (Consider location/radiation/quality/duration/timing/severity/associated sxs/prior Treatment) HPI Comments: Patient here complaining of dark stools 10 days with associated diffuse abdominal cramping. Patient's abdominal cramping has been persistent and nothing makes it better or worse. No treatment use for prior to arrival. No associated fever or chills. Denies any urinary symptoms. Denies weakness or syncope. No syncope. No emesis. Does endorse some nausea. No prior history of gastrointestinal bleeding but does use Eilquis as well as Plavix. Saw her physician today for follow-up visit from a recent MI and informed him of her symptoms and a stool guaiac was performed in the office which was positive. Patient was referred to the ED for eval for GI bleeding  Patient is a 65 y.o. female presenting with abdominal pain. The history is provided by the patient.  Abdominal Pain   Past Medical History  Diagnosis Date  . Ischemic cardiomyopathy     a. s/p MDT dual chamber ICD implanted 2013 by Dr Westley Gambles at [facility], now followed by Dr Rayann Heman. b. LVEF 30-35% by echo 01/2015 at [facility] per report; 35-40% by echo 01/2015 at [facility].  Marland Kitchen History of stroke      a. 1993  . Coronary artery disease     a. s/p previous interventions in [place] per patient, no records available. b. cath 04/2013 with non-obstructive disease. c. STEMI 01/2015 @ [place] [facility] - cardiac cath had shown 100% stenosis of prox PDA, patent stent in RCA with otherwise normal coronary arteries.  . Lymphoma ([place])     a. s/p chemo/radiation 2009  . Chronic systolic CHF (congestive heart failure) ([place])   . HTN (hypertension)   . HLD (hyperlipidemia)   . Ventricular tachycardia ([place])     a. CL 300 msec requiring ICD shocks therpay 5/14 b. recurrent VT 04/2014, placed  on amiodarone c. recurrent VT 05/2014 s/p ablation by Dr Rayann Heman d. s/p repeat ablation 07/2014  . DM2 (diabetes mellitus, type 2), newly diagnosed    . CKD (chronic kidney disease) stage 3, GFR 30-59 ml/min    . Polymyalgia rheumatica ([place])   . Depression   . GERD (gastroesophageal reflux disease)   . Renal atrophy, left     a. [place] 02/2015: "Left renal atrophy, likely from chronic renal arterial stenosis"  . Mitral regurgitation     a. mod by echo 01/2015.  . Diverticulosis     a. By CT 02/2015.  Marland Kitchen PAF (paroxysmal atrial fibrillation) ([place])     a. Brief paroxysms during admission 02/2015 - amiodarone increased. Not placed on anticoag due to dual antiplatelet therapy and bleeding risk including in the setting of possible GI pathology.   . Hiatal hernia     a. by EGD 06/2014.  Marland Kitchen Gastritis     a. by EGD 06/2014.  Marland Kitchen Anxiousness 03/02/2015   Past Surgical History  Procedure Laterality Date  . Cardiac defibrillator placement  03/2011    MDT ICD implanted at [facility] by Dr Tana Coast  . Coronary angioplasty with stent placement    . Left and right heart catheterization with coronary angiogram N/A 05/03/2013    non-obstructive CAD  . V-tach ablation N/A 05/26/2014    VT ablation by Dr Rayann Heman - PVCs arising from the inferolateral LV, extensive substrate ablation  . Electrophysiologic study N/A 08/04/2014  Procedure: V Tach Ablation;  Surgeon: Thompson Grayer, MD;  Location: [place] [facility];  Service: Cardiovascular;  Laterality: N/A;   Family History  Problem Relation Age of Onset  . Diabetes Father   . Hypertension Father   . Heart disease Father   . Alcoholism Father   . Hypertension Mother   . Heart disease Mother   . Breast cancer Mother   . Stroke Mother   . Diabetes Brother   . Diabetes Brother   . Diabetes Brother   . Diabetes Brother   . Hypertension Sister   . Heart disease Sister   . Alcoholism Sister   . Thyroid disease Sister   . Heart attack Sister   . Heart attack Brother   .  Stroke Brother    Social History  Substance Use Topics  . Smoking status: Former Smoker -- 0.25 packs/day    Quit date: 05/20/2014  . Smokeless tobacco: Never Used     Comment: she is not ready to quit  . Alcohol Use: No   OB History    No data available     Review of Systems  Gastrointestinal: Positive for abdominal pain.  All other systems reviewed and are negative.     Allergies  Flagyl; Contrast media; Ioxaglate; Potassium sulfate; and Potassium-containing compounds  Home Medications   Prior to Admission medications   Medication Sig Start Date End Date Taking? Authorizing Provider  ALPRAZolam (XANAX) 0.25 MG tablet Take 1-2 tablets (0.25-0.5 mg total) by mouth 3 (three) times daily as needed for anxiety. 03/02/15   Rexene Alberts, MD  amiodarone (PACERONE) 200 MG tablet Take 1 tablet (200 mg total) by mouth 2 (two) times daily. 02/28/15   Dayna N Dunn, PA-C  aspirin EC 81 MG tablet Take 81 mg by mouth daily.    Historical Provider, MD  atorvastatin (LIPITOR) 40 MG tablet Take 1 tablet (40 mg total) by mouth daily at 6 PM. 06/20/14   Thompson Grayer, MD  carvedilol (COREG) 6.25 MG tablet Take 1.5 tablets (9.375 mg total) by mouth 2 (two) times daily with a meal. 03/12/15   Allie Bossier, MD  clopidogrel (PLAVIX) 75 MG tablet Take 75 mg by mouth at bedtime.     Historical Provider, MD  isosorbide mononitrate (IMDUR) 60 MG 24 hr tablet Take 1 tablet (60 mg total) by mouth daily. 03/12/15   Allie Bossier, MD  levothyroxine (SYNTHROID, LEVOTHROID) 50 MCG tablet Take 1 tablet (50 mcg total) by mouth daily before breakfast. 03/12/15   Allie Bossier, MD  Multiple Vitamin (MULTI VITAMIN DAILY PO) Take 1 tablet by mouth daily.    Historical Provider, MD  nitroGLYCERIN (NITROSTAT) 0.4 MG SL tablet Place 1 tablet (0.4 mg total) under the tongue every 5 (five) minutes as needed for chest pain. Patient not taking: Reported on 03/01/2015 07/04/14   Patsey Berthold, NP  pantoprazole (PROTONIX) 40  MG tablet Take 1 tablet (40 mg total) by mouth 2 (two) times daily. 02/28/15   Dayna N Dunn, PA-C  PARoxetine (PAXIL) 10 MG tablet Take 1 tablet (10 mg total) by mouth daily. 03/12/15   Allie Bossier, MD  sucralfate (CARAFATE) 1 GM/10ML suspension Take 10 mLs (1 g total) by mouth 4 (four) times daily -  with meals and at bedtime. 02/28/15   Dayna N Dunn, PA-C  torsemide (DEMADEX) 20 MG tablet Take 1 tablet (20 mg total) by mouth daily. 03/12/15   Allie Bossier, MD  traMADol Veatrice Bourbon)  50 MG tablet Take 1 tablet (50 mg total) by mouth every 12 (twelve) hours as needed for moderate pain or severe pain. 02/28/15   Dayna N Dunn, PA-C   BP 151/98 mmHg  Pulse 78  Temp(Src) 98.2 F (36.8 C) (Oral)  Resp 18  SpO2 100% Physical Exam  Constitutional: She is oriented to person, place, and time. She appears well-developed and well-nourished.  Non-toxic appearance. No distress.  HENT:  Head: Normocephalic and atraumatic.  Eyes: Conjunctivae, EOM and lids are normal. Pupils are equal, round, and reactive to light.  Neck: Normal range of motion. Neck supple. No tracheal deviation present. No thyroid mass present.  Cardiovascular: Normal rate, regular rhythm and normal heart sounds.  Exam reveals no gallop.   No murmur heard. Pulmonary/Chest: Effort normal and breath sounds normal. No stridor. No respiratory distress. She has no decreased breath sounds. She has no wheezes. She has no rhonchi. She has no rales.  Abdominal: Soft. Normal appearance and bowel sounds are normal. She exhibits no distension. There is no tenderness. There is no rigidity, no rebound, no guarding and no CVA tenderness.  Musculoskeletal: Normal range of motion. She exhibits no edema or tenderness.  Neurological: She is alert and oriented to person, place, and time. She has normal strength. No cranial nerve deficit or sensory deficit. GCS eye subscore is 4. GCS verbal subscore is 5. GCS motor subscore is 6.  Skin: Skin is warm and dry. No  abrasion and no rash noted.  Psychiatric: She has a normal mood and affect. Her speech is normal and behavior is normal.  Nursing note and vitals reviewed.   ED Course  Procedures (including critical care time) Labs Review Labs Reviewed  CBC WITH DIFFERENTIAL/PLATELET  URINALYSIS, ROUTINE W REFLEX MICROSCOPIC (NOT AT [facility])  COMPREHENSIVE METABOLIC PANEL  LIPASE, BLOOD  PROTIME-INR  APTT  TYPE AND SCREEN    Imaging Review No results found. I have personally reviewed and evaluated these images and lab results as part of my medical decision-making.   EKG Interpretation None      MDM   Final diagnoses:  None    Patient with guaiac-negative stools here but does have elevated BUN as well as INR. Will admit for further workup of possible gastrointestinal hemorrhage   Lacretia Leigh, MD 03/30/15 2245

## 2015-03-30 NOTE — ED Notes (Signed)
Per EMS-states she was at PCP today for abdominal pain-states they referred her to GI-patient called EMS instead-refused to go to [facility] pain since Christmas Eve

## 2015-03-30 NOTE — ED Notes (Signed)
Pt complains of dark stools since the day after Christmas, she states she was seen at her Dr today and was told to come her for further evaluation, no vomiting but states that she gets nauseated

## 2015-03-30 NOTE — ED Notes (Signed)
Nurse drawing labs. 

## 2015-03-31 ENCOUNTER — Inpatient Hospital Stay (HOSPITAL_COMMUNITY): Payer: Medicare Other

## 2015-03-31 ENCOUNTER — Ambulatory Visit (INDEPENDENT_AMBULATORY_CARE_PROVIDER_SITE_OTHER): Payer: Medicare Other | Admitting: Internal Medicine

## 2015-03-31 DIAGNOSIS — I82409 Acute embolism and thrombosis of unspecified deep veins of unspecified lower extremity: Secondary | ICD-10-CM

## 2015-03-31 DIAGNOSIS — E039 Hypothyroidism, unspecified: Secondary | ICD-10-CM

## 2015-03-31 DIAGNOSIS — K219 Gastro-esophageal reflux disease without esophagitis: Secondary | ICD-10-CM

## 2015-03-31 LAB — BASIC METABOLIC PANEL
ANION GAP: 10 (ref 5–15)
BUN: 32 mg/dL — ABNORMAL HIGH (ref 6–20)
CALCIUM: 8.7 mg/dL — AB (ref 8.9–10.3)
CO2: 27 mmol/L (ref 22–32)
Chloride: 107 mmol/L (ref 101–111)
Creatinine, Ser: 2.38 mg/dL — ABNORMAL HIGH (ref 0.44–1.00)
GFR, EST AFRICAN AMERICAN: 24 mL/min — AB (ref >60)
GFR, EST NON AFRICAN AMERICAN: 21 mL/min — AB (ref >60)
GLUCOSE: 97 mg/dL (ref 65–99)
POTASSIUM: 3 mmol/L — AB (ref 3.5–5.1)
Sodium: 144 mmol/L (ref 135–145)

## 2015-03-31 LAB — HEMOGLOBIN AND HEMATOCRIT, BLOOD
HCT: 29.4 % — ABNORMAL LOW (ref 36.0–46.0)
Hemoglobin: 9.7 g/dL — ABNORMAL LOW (ref 12.0–15.0)

## 2015-03-31 LAB — CBC
HCT: 23.7 % — ABNORMAL LOW (ref 36.0–46.0)
Hemoglobin: 8.6 g/dL — ABNORMAL LOW (ref 12.0–15.0)
MCH: 37.6 pg — ABNORMAL HIGH (ref 26.0–34.0)
MCHC: 36.3 g/dL — ABNORMAL HIGH (ref 30.0–36.0)
MCV: 103.5 fL — AB (ref 78.0–100.0)
PLATELETS: 197 10*3/uL (ref 150–400)
RBC: 2.29 MIL/uL — AB (ref 3.87–5.11)
RDW: 20.5 % — AB (ref 11.5–15.5)
WBC: 4 10*3/uL (ref 4.0–10.5)

## 2015-03-31 LAB — TROPONIN I
TROPONIN I: 0.05 ng/mL — AB (ref <0.031)
Troponin I: 0.04 ng/mL — ABNORMAL HIGH (ref <0.031)

## 2015-03-31 LAB — PROTIME-INR
INR: 2.28 — AB (ref 0.00–1.49)
PROTHROMBIN TIME: 24.9 s — AB (ref 11.6–15.2)

## 2015-03-31 LAB — GLUCOSE, CAPILLARY
GLUCOSE-CAPILLARY: 86 mg/dL (ref 65–99)
GLUCOSE-CAPILLARY: 93 mg/dL (ref 65–99)
Glucose-Capillary: 122 mg/dL — ABNORMAL HIGH (ref 65–99)
Glucose-Capillary: 91 mg/dL (ref 65–99)

## 2015-03-31 LAB — APTT: APTT: 34 s (ref 24–37)

## 2015-03-31 MED ORDER — TORSEMIDE 20 MG PO TABS
20.0000 mg | ORAL_TABLET | Freq: Every day | ORAL | Status: DC
  Administered 2015-03-31 – 2015-04-08 (×9): 20 mg via ORAL
  Filled 2015-03-31 (×9): qty 1

## 2015-03-31 MED ORDER — ALPRAZOLAM 0.25 MG PO TABS
0.2500 mg | ORAL_TABLET | Freq: Three times a day (TID) | ORAL | Status: DC | PRN
  Administered 2015-03-31: 0.5 mg via ORAL
  Administered 2015-04-01 (×2): 0.25 mg via ORAL
  Administered 2015-04-02: 0.5 mg via ORAL
  Administered 2015-04-02 – 2015-04-03 (×3): 0.25 mg via ORAL
  Administered 2015-04-04 – 2015-04-06 (×4): 0.5 mg via ORAL
  Administered 2015-04-07: 0.25 mg via ORAL
  Filled 2015-03-31: qty 1
  Filled 2015-03-31 (×2): qty 2
  Filled 2015-03-31 (×2): qty 1
  Filled 2015-03-31: qty 2
  Filled 2015-03-31 (×2): qty 1
  Filled 2015-03-31: qty 2
  Filled 2015-03-31: qty 1
  Filled 2015-03-31 (×2): qty 2

## 2015-03-31 MED ORDER — PANTOPRAZOLE SODIUM 40 MG PO TBEC: 40.0000 mg | DELAYED_RELEASE_TABLET | Freq: Two times a day (BID) | ORAL | Status: DC

## 2015-03-31 MED ORDER — LEVOTHYROXINE SODIUM 50 MCG PO TABS
50.0000 ug | ORAL_TABLET | Freq: Every day | ORAL | Status: DC
  Administered 2015-03-31 – 2015-04-02 (×3): 50 ug via ORAL
  Filled 2015-03-31 (×3): qty 1

## 2015-03-31 MED ORDER — ADULT MULTIVITAMIN W/MINERALS CH
1.0000 | ORAL_TABLET | Freq: Every day | ORAL | Status: DC
  Administered 2015-03-31 – 2015-04-08 (×9): 1 via ORAL
  Filled 2015-03-31 (×9): qty 1

## 2015-03-31 MED ORDER — SUCRALFATE 1 GM/10ML PO SUSP
1.0000 g | Freq: Three times a day (TID) | ORAL | Status: DC
  Administered 2015-03-31 – 2015-04-05 (×16): 1 g via ORAL
  Filled 2015-03-31 (×19): qty 10

## 2015-03-31 MED ORDER — ASPIRIN EC 81 MG PO TBEC
81.0000 mg | DELAYED_RELEASE_TABLET | Freq: Every day | ORAL | Status: DC
  Administered 2015-03-31 – 2015-04-01 (×2): 81 mg via ORAL
  Filled 2015-03-31 (×2): qty 1

## 2015-03-31 MED ORDER — PANTOPRAZOLE SODIUM 40 MG IV SOLR
40.0000 mg | Freq: Two times a day (BID) | INTRAVENOUS | Status: DC
  Administered 2015-03-31 – 2015-04-08 (×18): 40 mg via INTRAVENOUS
  Filled 2015-03-31 (×17): qty 40

## 2015-03-31 MED ORDER — CARVEDILOL 6.25 MG PO TABS
9.3750 mg | ORAL_TABLET | Freq: Two times a day (BID) | ORAL | Status: DC
  Administered 2015-03-31 – 2015-04-08 (×15): 9.375 mg via ORAL
  Filled 2015-03-31 (×15): qty 1

## 2015-03-31 MED ORDER — ONDANSETRON HCL 4 MG PO TABS: 4.0000 mg | ORAL_TABLET | Freq: Three times a day (TID) | ORAL | Status: DC | PRN

## 2015-03-31 MED ORDER — TRAMADOL HCL 50 MG PO TABS
50.0000 mg | ORAL_TABLET | Freq: Two times a day (BID) | ORAL | Status: DC | PRN
  Administered 2015-04-04 – 2015-04-08 (×6): 50 mg via ORAL
  Filled 2015-03-31 (×6): qty 1

## 2015-03-31 MED ORDER — PAROXETINE HCL 20 MG PO TABS
10.0000 mg | ORAL_TABLET | Freq: Every day | ORAL | Status: DC
  Administered 2015-03-31 – 2015-04-08 (×9): 10 mg via ORAL
  Filled 2015-03-31 (×9): qty 1

## 2015-03-31 MED ORDER — APIXABAN 5 MG PO TABS
5.0000 mg | ORAL_TABLET | Freq: Two times a day (BID) | ORAL | Status: DC
  Administered 2015-03-31: 5 mg via ORAL
  Filled 2015-03-31: qty 1

## 2015-03-31 MED ORDER — NITROGLYCERIN 0.4 MG SL SUBL: 0.4000 mg | SUBLINGUAL_TABLET | SUBLINGUAL | Status: DC | PRN

## 2015-03-31 MED ORDER — SODIUM CHLORIDE 0.9 % IJ SOLN
3.0000 mL | Freq: Two times a day (BID) | INTRAMUSCULAR | Status: DC
  Administered 2015-03-31 – 2015-04-08 (×15): 3 mL via INTRAVENOUS

## 2015-03-31 MED ORDER — ATORVASTATIN CALCIUM 40 MG PO TABS
40.0000 mg | ORAL_TABLET | Freq: Every day | ORAL | Status: DC
  Administered 2015-03-31 – 2015-04-07 (×8): 40 mg via ORAL
  Filled 2015-03-31 (×8): qty 1

## 2015-03-31 MED ORDER — INSULIN ASPART 100 UNIT/ML ~~LOC~~ SOLN
0.0000 [IU] | Freq: Three times a day (TID) | SUBCUTANEOUS | Status: DC
  Administered 2015-04-01: 1 [IU] via SUBCUTANEOUS
  Administered 2015-04-02: 2 [IU] via SUBCUTANEOUS
  Administered 2015-04-02 – 2015-04-07 (×3): 1 [IU] via SUBCUTANEOUS

## 2015-03-31 MED ORDER — MORPHINE SULFATE (PF) 2 MG/ML IV SOLN
2.0000 mg | INTRAVENOUS | Status: DC | PRN
  Administered 2015-03-31 – 2015-04-07 (×22): 2 mg via INTRAVENOUS
  Filled 2015-03-31 (×24): qty 1

## 2015-03-31 MED ORDER — PROMETHAZINE HCL 25 MG PO TABS
25.0000 mg | ORAL_TABLET | Freq: Four times a day (QID) | ORAL | Status: DC | PRN
Start: 2015-03-31 — End: 2015-04-08
  Administered 2015-04-07: 25 mg via ORAL
  Filled 2015-03-31: qty 1

## 2015-03-31 MED ORDER — AMIODARONE HCL 200 MG PO TABS
200.0000 mg | ORAL_TABLET | Freq: Two times a day (BID) | ORAL | Status: DC
  Administered 2015-03-31 – 2015-04-08 (×17): 200 mg via ORAL
  Filled 2015-03-31 (×17): qty 1

## 2015-03-31 MED ORDER — CLOPIDOGREL BISULFATE 75 MG PO TABS: 75.0000 mg | ORAL_TABLET | Freq: Every day | ORAL | Status: DC

## 2015-03-31 MED ORDER — ONDANSETRON HCL 4 MG/2ML IJ SOLN
4.0000 mg | Freq: Four times a day (QID) | INTRAMUSCULAR | Status: DC | PRN
  Administered 2015-03-31 – 2015-04-02 (×3): 4 mg via INTRAVENOUS
  Filled 2015-03-31 (×3): qty 2

## 2015-03-31 MED ORDER — ONDANSETRON HCL 4 MG PO TABS: 4.0000 mg | ORAL_TABLET | Freq: Four times a day (QID) | ORAL | Status: DC | PRN

## 2015-03-31 MED ORDER — ISOSORBIDE MONONITRATE ER 30 MG PO TB24
60.0000 mg | ORAL_TABLET | Freq: Every day | ORAL | Status: DC
  Administered 2015-03-31 – 2015-04-08 (×9): 60 mg via ORAL
  Filled 2015-03-31 (×9): qty 2

## 2015-03-31 MED ORDER — SODIUM CHLORIDE 0.9 % IV SOLN
INTRAVENOUS | Status: DC
  Administered 2015-03-31 – 2015-04-01 (×3): via INTRAVENOUS

## 2015-03-31 MED ORDER — POTASSIUM CHLORIDE CRYS ER 20 MEQ PO TBCR
20.0000 meq | EXTENDED_RELEASE_TABLET | Freq: Every day | ORAL | Status: DC
  Administered 2015-03-31 – 2015-04-08 (×9): 20 meq via ORAL
  Filled 2015-03-31 (×9): qty 1

## 2015-03-31 NOTE — Progress Notes (Signed)
[facility] PROGRESS NOTE  [name] Terry [id number] DOB: [birth year] DOA: 03/30/2015 PCP: Pcp Not In System  Summary 03/31/15: Multiple medical issues- dvt/pe afib, anticoagulated, gi bleed recent, coronary stents. Came in with melena. Had extensive discussion with patient and daughter at bedside. Patient does not seem to have good grasp of the complexity of her condition. Recommended IVC filter, and tos top anticoagulation, have gi perform another endoscopy. Will discontinue Eliquis/Plavix, continue ASA in view of CAD with coronary stents which are more than a year old. Will request IR guided IVC filter tomorrow once patient sufficiently off Eliquis and ask GI to see-?Can source of bleeding be arrested for patient to resume anticoagulation safely in future. Will need prbc transfusion if Hb drops below 8g/dl in view of cardiac issues. Unfortunately, no risk free choices for her and I have explained this to her and her daughter. Plan GI bleed  D/c Eliquis/plavix  Continue protonix  Transfuse prbc if Hb drops below 8g/dl  Gi to see  Clear liquids for now  CT abdomen and pelvis DVT  For ivc filter  Lower extremity venous doppler  Consult IR in am Chronic systolic CHF (congestive heart failure) (HCC)/HTN (hypertension)/HLD (hyperlipidemia)/Cardiomyopathy, ischemic-EF 35-40%/CKD (chronic kidney disease) stage 3, GFR 30-59 ml/min/Diabetes mellitus type 2 in obese (HCC)/GERD (gastroesophageal reflux disease)/Anxiety state/Hypothyroidism  Continue current mx  Code Status: Full Code Family Communication: Daughter at bedside Disposition Plan: ?Eventually home   Consultants:    Procedures:    Antibiotics:  None  HPI/Subjective: Abdominal pain, still has melena  Objective: Filed Vitals:   03/31/15 0537 03/31/15 1324  BP: 132/64 107/61  Pulse: 67 60  Temp: 98.7 F (37.1 C) 98.1 F (36.7 C)  Resp: 18 16    Intake/Output Summary (Last 24 hours) at 03/31/15  2334 Last data filed at 03/31/15 1500  Gross per 24 hour  Intake    240 ml  Output      0 ml  Net    240 ml   Filed Weights   03/30/15 2337  Weight: 85.9 kg (189 lb 6 oz)    Exam:   General:  Comfortable at rest.  Cardiovascular: S1-S2 normal. No murmurs. Pulse regular.  Respiratory: Good air entry bilaterally. No rhonchi or rales.  Abdomen: Soft and nontender. Normal bowel sounds. No organomegaly.  Musculoskeletal: No pedal edema   Neurological: Intact  Data Reviewed: Basic Metabolic Panel:  Recent Labs Lab 03/30/15 2015 03/31/15 0620  NA 143 144  K 3.1* 3.0*  CL 103 107  CO2 28 27  GLUCOSE 112* 97  BUN 33* 32*  CREATININE 2.34* 2.38*  CALCIUM 9.0 8.7*   Liver Function Tests:  Recent Labs Lab 03/30/15 2015  AST 71*  ALT 54  ALKPHOS 68  BILITOT 1.0  PROT 7.6  ALBUMIN 3.6    Recent Labs Lab 03/30/15 2015  LIPASE 61*   No results for input(s): AMMONIA in the last 168 hours. CBC:  Recent Labs Lab 03/30/15 2015 03/31/15 0035 03/31/15 0620  WBC 5.9  --  4.0  NEUTROABS 3.9  --   --   HGB 9.5* 9.7* 8.6*  HCT 27.2* 29.4* 23.7*  MCV 99.3  --  103.5*  PLT 235  --  197   Cardiac Enzymes:  Recent Labs Lab 03/31/15 0035 03/31/15 0620  TROPONINI 0.05* 0.04*   BNP (last 3 results)  Recent Labs  02/20/15 1645 02/21/15 1042 03/01/15 0839  BNP 463.9* 386.1* 452.0*    ProBNP (last 3 results) No  results for input(s): PROBNP in the last 8760 hours.  CBG:  Recent Labs Lab 03/30/15 2305 03/31/15 0047 03/31/15 0727 03/31/15 1145 03/31/15 1707  GLUCAP 81 122* 91 86 93    No results found for this or any previous visit (from the past 240 hour(s)).   Studies: Ct Abdomen Pelvis Wo Contrast  03/31/2015  CLINICAL DATA:  Black stools since Christmas. Intermittent aching epigastric pain starting after Christmas, worsened with eating. Guaiac positive stools at the PCP office reportedly. Associated symptoms of shortness of breath,  intermittent chest pain, hemoptysis and anxiety. EXAM: CT ABDOMEN AND PELVIS WITHOUT CONTRAST TECHNIQUE: Multidetector CT imaging of the abdomen and pelvis was performed following the standard protocol without IV contrast. COMPARISON:  CT abdomen and pelvis dated 03/10/2015. FINDINGS: The lung bases are now clear. Interval resolution of the small right pleural effusion seen on earlier CT. Cardiomegaly appears grossly stable, incompletely imaged. Bowel is normal in caliber. Scattered diverticulosis noted throughout the descending and sigmoid colon without evidence of acute diverticulitis. No bowel wall thickening or evidence of bowel wall inflammation identified. Appendix is normal. Stomach is unremarkable. No free fluid or abscess collection identified. No free intraperitoneal air. Liver, spleen, pancreas, gallbladder, and adrenal glands are within normal limits for a noncontrast study. Bilateral renal cysts again noted, difficult to definitively characterize without intravascular contrast, but stable compared to multiple prior studies. Left kidney is somewhat atrophic, stable. No renal stone or hydronephrosis. No ureteral or bladder calculi identified. Adrenal glands are unremarkable. Heavy atherosclerotic changes are seen along the walls of the normal-caliber abdominal aorta and branch vessels. Bilateral renal artery stents in place. Adnexal regions are unremarkable. Scattered degenerative changes are seen throughout the thoracolumbar spine but no acute osseous abnormality. Superficial soft tissues are unremarkable. IMPRESSION: 1. Colonic diverticulosis without evidence of acute diverticulitis. 2. Overall, no evidence of acute intra-abdominal or intrapelvic abnormality identified. Chronic/incidental findings detailed above, including extensive atherosclerotic changes. Electronically Signed   By: Franki Cabot M.D.   On: 03/31/2015 17:50   Dg Chest Port 1 View  03/31/2015  CLINICAL DATA:  Sudden onset of upper  abdominal and chest pain. Nausea. EXAM: PORTABLE CHEST 1 VIEW COMPARISON:  03/08/2015 FINDINGS: Dual lead left-sided pacemaker remains in place. Cardiomegaly is again seen, unchanged. Improved bilateral opacities from prior exam without new confluent airspace disease. Mild vascular congestion. No large pleural effusion or pneumothorax. IMPRESSION: Improved aeration from prior exam with near complete resolution of previous bilateral opacities. Mild vascular congestion and stable cardiomegaly. Electronically Signed   By: Jeb Levering M.D.   On: 03/31/2015 02:48   Dg Abd Portable 1v  03/31/2015  CLINICAL DATA:  Acute onset of severe upper abdominal pain and generalized chest pain. Nausea. Initial encounter. EXAM: PORTABLE ABDOMEN - 1 VIEW COMPARISON:  CT of the abdomen and pelvis performed 03/10/2015 FINDINGS: The visualized bowel gas pattern is unremarkable. Scattered air and stool filled loops of colon are seen; no abnormal dilatation of small bowel loops is seen to suggest small bowel obstruction. No free intra-abdominal air is identified, though evaluation for free air is limited on a single supine view. Mild degenerative change is noted at the lower lumbar spine; the sacroiliac joints are unremarkable in appearance. The visualized lung bases are essentially clear. Pacemaker/AICD leads are partially imaged. IMPRESSION: Unremarkable bowel gas pattern; no free intra-abdominal air seen. Small amount of stool noted in the colon. Electronically Signed   By: Garald Balding M.D.   On: 03/31/2015 02:46    Scheduled  Meds: . amiodarone  200 mg Oral BID  . aspirin EC  81 mg Oral Daily  . atorvastatin  40 mg Oral q1800  . carvedilol  9.375 mg Oral BID WC  . insulin aspart  0-9 Units Subcutaneous TID WC  . isosorbide mononitrate  60 mg Oral Daily  . levothyroxine  50 mcg Oral QAC breakfast  . multivitamin with minerals  1 tablet Oral Daily  . pantoprazole (PROTONIX) IV  40 mg Intravenous Q12H  . PARoxetine   10 mg Oral Daily  . potassium chloride SA  20 mEq Oral Daily  . sodium chloride  3 mL Intravenous Q12H  . sucralfate  1 g Oral TID WC & HS  . torsemide  20 mg Oral Daily   Continuous Infusions: . sodium chloride 75 mL/hr at 03/31/15 0031     Time spent: 45 minutes    Channa Hazelett  [facility] Pager [phone]. If 7PM-7AM, please contact night-coverage at [email or web address], password [facility] 03/31/2015, 11:34 PM  LOS: 1 day

## 2015-03-31 NOTE — Progress Notes (Signed)
Pt still complains of epigastric pain and left upper quadrant pain with nausea, no vomiting. Has had some relief with morphine. No BM as of yet. Awaiting GI consult.

## 2015-03-31 NOTE — Progress Notes (Addendum)
Pt is active with [facility]. Spoke with Myriam Jacobson, RN  On call number [phone] push option [phone] for [facility].  Will need d/c summary, HH orders and meds Fax to [phone].

## 2015-03-31 NOTE — Care Management Note (Signed)
Case Management Note  Patient Details  Name: [name] Terry MRN: [id number] Date of Birth: [birth year]  Subjective/Objective:     Admitted with GIB               Action/Plan: pt to d/c home with [facility]   Expected Discharge Date:                  Expected Discharge Plan:  [place]  In-House Referral:     Discharge planning Services  CM Consult  Post Acute Care Choice:    Choice offered to:     DME Arranged:    DME Agency:     HH Arranged:  RN, PT, Nurse's Aide [place] Agency:  [facility]  Status of Service:  In process, will continue to follow  Medicare Important Message Given:    Date Medicare IM Given:    Medicare IM give by:    Date Additional Medicare IM Given:    Additional Medicare Important Message give by:     If discussed at [place] of Stay Meetings, dates discussed:    Additional CommentsPurcell Mouton, RN 03/31/2015, 3:45 PM

## 2015-03-31 NOTE — Progress Notes (Deleted)
CRITICAL VALUE ALERT  Critical value received:  NA 117   Date of notification:  03-31-2015    Time of notification:  0833  Critical value read back:yes  Nurse who received alert:  Danton Sewer  MD notified (1st page):  Dr. Sanjuana Letters  Time of first page:  [phone]  MD notified (2nd page):  Time of second page:  Responding MD:  Dr. Sanjuana Letters  Time MD responded:  [phone]

## 2015-03-31 NOTE — Progress Notes (Addendum)
*  PRELIMINARY RESULTS* Vascular Ultrasound Lower extremity venous duplex has been completed.  Preliminary findings: Small segment DVT involving the right mid peroneal vein.  No DVT LLE. Right baker's cyst noted.   Landry Mellow, RDMS, RVT  03/31/2015, 2:44 PM

## 2015-03-31 NOTE — H&P (Signed)
[facility] History and Physical  [name] Terry [id number] DOB: Dec 30, 1950 DOA: 03/30/2015  Referring physician: ED PCP: Pcp Not In System   Chief Complaint:  Black stools  HPI:  [name] Terry is a 65 year old female with a past medical history of ischemic cardiomyopathy, chronic systolic congestive heart failure last EF 35-40% in 01/2015, CKD, DM2, CAD, PAF on Eliquis, STEMI in November 2016-treated medically at [facility] with a cardiac catheterization showing 100% stenosis of the proximal PDA; with complaints of black stools. Patient reports history of black stools since Christmas. She reports having 1-2 small bowel movements per day. Report intermittent aching epigastric pain starting after Christmas worsened with eating. She went to her primary care office today and she was scheduled to be seen by gastroenterology. However, she reports being directed by the gastroenterologist's office here due to a guaiac positive stools that was obtained at the PCP office reportedly. She reports associated symptoms of shortness of breath, intermittent chest pain(unchanged), hemoptysis(improved from previous hospitalization), and anxiety.  Upon admission into the emergency department patient was found to have a hemoglobin of 9.5 similar to last discharge on 03/12/2015. However her INR was seen to be slightly elevated at 1.88.    Review of Systems  Constitutional: Positive for malaise/fatigue. Negative for fever, chills and weight loss.  HENT: Negative for hearing loss and tinnitus.   Eyes: Negative for photophobia and pain.  Respiratory: Positive for hemoptysis and shortness of breath.   Cardiovascular: Positive for chest pain. Negative for palpitations and leg swelling.  Gastrointestinal: Negative for vomiting and abdominal pain.  Genitourinary: Negative for urgency and frequency.  Musculoskeletal: Positive for myalgias and joint pain.  Skin: Negative for itching and rash.   Neurological: Positive for weakness. Negative for seizures and loss of consciousness.  Endo/Heme/Allergies: Negative for environmental allergies and polydipsia.  Psychiatric/Behavioral: Negative for hallucinations. The patient is nervous/anxious.        Past Medical History  Diagnosis Date  . Ischemic cardiomyopathy     a. s/p MDT dual chamber ICD implanted 2013 by Dr Westley Gambles at [facility], now followed by Dr Rayann Heman. b. LVEF 30-35% by echo 01/2015 at [facility] per report; 35-40% by echo 01/2015 at [facility].  Marland Kitchen History of stroke      a. 1993  . Coronary artery disease     a. s/p previous interventions in [place] per patient, no records available. b. cath 04/2013 with non-obstructive disease. c. STEMI 01/2015 @ [place] - cardiac cath had shown 100% stenosis of prox PDA, patent stent in RCA with otherwise normal coronary arteries.  . Lymphoma ([place])     a. s/p chemo/radiation 2009  . Chronic systolic CHF (congestive heart failure) ([place])   . HTN (hypertension)   . HLD (hyperlipidemia)   . Ventricular tachycardia ([place])     a. CL 300 msec requiring ICD shocks therpay 5/14 b. recurrent VT 04/2014, placed on amiodarone c. recurrent VT 05/2014 s/p ablation by Dr Rayann Heman d. s/p repeat ablation 07/2014  . DM2 (diabetes mellitus, type 2), newly diagnosed    . CKD (chronic kidney disease) stage 3, GFR 30-59 ml/min    . Polymyalgia rheumatica ([place])   . Depression   . GERD (gastroesophageal reflux disease)   . Renal atrophy, left     a. [place] 02/2015: "Left renal atrophy, likely from chronic renal arterial stenosis"  . Mitral regurgitation     a. mod by echo 01/2015.  . Diverticulosis     a. By CT 02/2015.  Marland Kitchen PAF (  paroxysmal atrial fibrillation) ([place])     a. Brief paroxysms during admission 02/2015 - amiodarone increased. Not placed on anticoag due to dual antiplatelet therapy and bleeding risk including in the setting of possible GI pathology.   . Hiatal hernia     a. by EGD 06/2014.  Marland Kitchen Gastritis     a. by  EGD 06/2014.  Marland Kitchen Anxiousness 03/02/2015     Past Surgical History  Procedure Laterality Date  . Cardiac defibrillator placement  03/2011    MDT ICD implanted at [facility] by Dr Tana Coast  . Coronary angioplasty with stent placement    . Left and right heart catheterization with coronary angiogram N/A 05/03/2013    non-obstructive CAD  . V-tach ablation N/A 05/26/2014    VT ablation by Dr Rayann Heman - PVCs arising from the inferolateral LV, extensive substrate ablation  . Electrophysiologic study N/A 08/04/2014    Procedure: V Tach Ablation;  Surgeon: Thompson Grayer, MD;  Location: [place] [facility];  Service: Cardiovascular;  Laterality: N/A;      Social History:  reports that she quit smoking about 10 months ago. She has never used smokeless tobacco. She reports that she does not drink alcohol or use illicit drugs. Where does patient live--home and with whom if at home? alone Can patient participate in ADLs? yes  Allergies  Allergen Reactions  . Flagyl [Metronidazole] Swelling and Rash    Face swells   . Contrast Media [Iodinated Diagnostic Agents] Rash  . Ioxaglate Rash  . Potassium Sulfate Rash and Other (See Comments)    Headaches  . Potassium-Containing Compounds Other (See Comments)    Headaches-- reports no problems now    Family History  Problem Relation Age of Onset  . Diabetes Father   . Hypertension Father   . Heart disease Father   . Alcoholism Father   . Hypertension Mother   . Heart disease Mother   . Breast cancer Mother   . Stroke Mother   . Diabetes Brother   . Diabetes Brother   . Diabetes Brother   . Diabetes Brother   . Hypertension Sister   . Heart disease Sister   . Alcoholism Sister   . Thyroid disease Sister   . Heart attack Sister   . Heart attack Brother   . Stroke Brother        Prior to Admission medications   Medication Sig Start Date End Date Taking? Authorizing Provider  ALPRAZolam (XANAX) 0.25 MG tablet Take 1-2 tablets (0.25-0.5 mg total) by  mouth 3 (three) times daily as needed for anxiety. 03/02/15  Yes Rexene Alberts, MD  amiodarone (PACERONE) 200 MG tablet Take 1 tablet (200 mg total) by mouth 2 (two) times daily. 02/28/15  Yes Dayna N Dunn, PA-C  apixaban (ELIQUIS) 5 MG TABS tablet Take 5 mg by mouth 2 (two) times daily.   Yes Historical Provider, MD  aspirin EC 81 MG tablet Take 81 mg by mouth daily.   Yes Historical Provider, MD  atorvastatin (LIPITOR) 40 MG tablet Take 1 tablet (40 mg total) by mouth daily at 6 PM. 06/20/14  Yes Thompson Grayer, MD  carvedilol (COREG) 6.25 MG tablet Take 1.5 tablets (9.375 mg total) by mouth 2 (two) times daily with a meal. 03/12/15  Yes Allie Bossier, MD  clopidogrel (PLAVIX) 75 MG tablet Take 75 mg by mouth at bedtime.    Yes Historical Provider, MD  GLIPIZIDE XL 2.5 MG 24 hr tablet Take 2.5 mg by mouth daily. 03/21/15  Yes Historical Provider, MD  isosorbide mononitrate (IMDUR) 60 MG 24 hr tablet Take 1 tablet (60 mg total) by mouth daily. 03/12/15  Yes Allie Bossier, MD  levothyroxine (SYNTHROID, LEVOTHROID) 50 MCG tablet Take 1 tablet (50 mcg total) by mouth daily before breakfast. 03/12/15  Yes Allie Bossier, MD  Multiple Vitamin (MULTI VITAMIN DAILY PO) Take 1 tablet by mouth daily.   Yes Historical Provider, MD  nitroGLYCERIN (NITROSTAT) 0.4 MG SL tablet Place 1 tablet (0.4 mg total) under the tongue every 5 (five) minutes as needed for chest pain. 07/04/14  Yes Amber Sena Slate, NP  ondansetron (ZOFRAN) 4 MG tablet Take 4 mg by mouth every 6 (six) hours as needed. Nausea 03/18/15  Yes Historical Provider, MD  pantoprazole (PROTONIX) 40 MG tablet Take 1 tablet (40 mg total) by mouth 2 (two) times daily. 02/28/15  Yes Dayna N Dunn, PA-C  PARoxetine (PAXIL) 10 MG tablet Take 1 tablet (10 mg total) by mouth daily. 03/12/15  Yes Allie Bossier, MD  potassium chloride SA (K-DUR,KLOR-CON) 20 MEQ tablet Take 20 mEq by mouth daily.   Yes Historical Provider, MD  promethazine (PHENERGAN) 25 MG tablet Take  25 mg by mouth every 6 (six) hours as needed for nausea or vomiting.   Yes Historical Provider, MD  sucralfate (CARAFATE) 1 GM/10ML suspension Take 10 mLs (1 g total) by mouth 4 (four) times daily -  with meals and at bedtime. 02/28/15  Yes Dayna N Dunn, PA-C  torsemide (DEMADEX) 20 MG tablet Take 1 tablet (20 mg total) by mouth daily. 03/12/15  Yes Allie Bossier, MD  traMADol (ULTRAM) 50 MG tablet Take 1 tablet (50 mg total) by mouth every 12 (twelve) hours as needed for moderate pain or severe pain. 02/28/15  Yes Charlie Pitter, PA-C     Physical Exam: Filed Vitals:   03/30/15 1908 03/30/15 2145 03/30/15 2306 03/30/15 2337  BP: 151/98 142/73 145/82 148/88  Pulse: 78 64 67 79  Temp: 98.2 F (36.8 C)   98.4 F (36.9 C)  TempSrc: Oral   Oral  Resp: 18 15 18 16   Height:    5\' 5"  (1.651 m)  Weight:    85.9 kg (189 lb 6 oz)  SpO2: 100% 100% 100% 100%     Constitutional: Vital signs reviewed. Patient is a well-developed and well-nourished in no acute distress and cooperative with exam. Alert and oriented x3.  Head: Normocephalic and atraumatic  Ear: TM normal bilaterally  Mouth: no erythema or exudates, MMM  Eyes: PERRL, EOMI, conjunctivae normal, No scleral icterus.  Neck: Supple, Trachea midline normal ROM, No JVD, mass, thyromegaly, or carotid bruit present.  Cardiovascular: RRR, S1 normal, S2 normal, no MRG, pulses symmetric and intact bilaterally  Pulmonary/Chest: CTAB, no wheezes, rales, or rhonchi  Abdominal: Soft. Non-tender, non-distended, bowel sounds are normal, no masses, organomegaly, or guarding present.  GU: no CVA tenderness Musculoskeletal: No joint deformities, erythema, or stiffness, ROM full and no nontender Ext: no edema and no cyanosis, pulses palpable bilaterally (DP and PT)  Hematology: no cervical, inginal, or axillary adenopathy.  Neurological: A&O x3, Strenght is normal and symmetric bilaterally, cranial nerve II-XII are grossly intact, no focal motor deficit,  sensory intact to light touch bilaterally.  Skin: Warm, dry and intact. No rash, cyanosis, or clubbing.  Psychiatric: Normal mood and affect. speech and behavior is normal. Judgment and thought content normal. Cognition and memory are normal.      Data Review  Micro Results No results found for this or any previous visit (from the past 240 hour(s)).  Radiology Reports Ct Abdomen Pelvis Wo Contrast  03/10/2015  CLINICAL DATA:  Abdominal pain with nausea and vomiting. EXAM: CT ABDOMEN AND PELVIS WITHOUT CONTRAST TECHNIQUE: Multidetector CT imaging of the abdomen and pelvis was performed following the standard protocol without IV contrast. COMPARISON:  03/08/2015. FINDINGS: Lower chest: There is a small right pleural effusion. Atelectasis is noted overlying the left lower lobe. Heart size is enlarged. There is calcifications within the RCA, LAD and left circumflex coronary arteries. Hepatobiliary: No focal liver abnormality identified. No biliary dilatation. Increased attenuation within the gallbladder are noted which may represent gallbladder sludge. No gallbladder wall thickening noted. Pancreas: The pancreas is unremarkable. Spleen: Negative. Adrenals/Urinary Tract: The adrenal glands are negative. Bilateral renal cysts are identified. These are incompletely characterized without IV contrast. There is asymmetric left renal atrophy. No kidney stone or evidence of hydronephrosis identified. The urinary bladder appears normal for degree of distention. Stomach/Bowel: The stomach is normal. The small bowel loops are within normal limits. No dilated loops of small bowel noted. The appendix is visualized and is normal. Unremarkable appearance of the proximal colon. Multiple distal colonic diverticula noted without acute inflammation. Vascular/Lymphatic: Aortic atherosclerosis noted. No enlarged retroperitoneal or mesenteric adenopathy. No enlarged pelvic or inguinal lymph nodes. Reproductive: Uterus and  adnexal structures are unremarkable. Other: There is no free fluid identified.  No fluid collections. Musculoskeletal: Unremarkable. IMPRESSION: 1. No acute findings identified within the abdomen or pelvis. 2. Right pleural effusion. 3. Aortic atherosclerosis 4. Bilateral renal cyst. 5. Colonic diverticulosis without acute inflammation. Electronically Signed   By: Kerby Moors M.D.   On: 03/10/2015 18:29   Dg Chest 2 View  03/03/2015  CLINICAL DATA:  Central chest pain and shortness of breath for 3 hours. EXAM: CHEST  2 VIEW COMPARISON:  Single AP view 03/01/2015 FINDINGS: Dual lead left-sided pacemaker remains in place. Improving bibasilar aeration from prior exam. Minimal residual right perihilar opacity persists. Cardiomediastinal contours are unchanged. No pleural effusion or pneumothorax. No acute osseous abnormalities. IMPRESSION: Improving bibasilar aeration. Minimal residual right perihilar opacity, may reflect resolving edema versus atelectasis. Exam is otherwise unchanged. Electronically Signed   By: Jeb Levering M.D.   On: 03/03/2015 18:06   Dg Chest Port 1 View  03/08/2015  CLINICAL DATA:  Respiratory failure. EXAM: PORTABLE CHEST 1 VIEW COMPARISON:  03/07/2015. FINDINGS: Right IJ line in stable position. Cardiac pacer noted with lead tips in right atrium and right ventricle. Stable cardiomegaly. Interim slight clearing of bilateral pulmonary alveolar infiltrates/edema. No pleural effusion or pneumothorax. IMPRESSION: 1. Right IJ line stable position. 2. Cardiac pacer stable position. Stable cardiomegaly . Interim slight clearing of bilateral pulmonary infiltrates/edema. Electronically Signed   By: [place]   On: 03/08/2015 07:12   Dg Chest Port 1 View  03/07/2015  CLINICAL DATA:  Respiratory failure. EXAM: PORTABLE CHEST 1 VIEW COMPARISON:  03/07/2015. FINDINGS: Right IJ line in stable position. Cardiac pacer with lead tips in right atrium right ventricle. Cardiomegaly with  bilateral pulmonary infiltrates consistent with pulmonary edema. Bilateral pneumonia cannot be excluded. No pleural effusion or pneumothorax. IMPRESSION: 1. Right IJ line in stable position. 2. Cardiac pacer stable position. Persistent prominent cardiomegaly. 3. Persistent unchanged bilateral pulmonary infiltrates consistent with bilateral pulmonary edema and/or pneumonia. Electronically Signed   By: Marcello Moores  Register   On: 03/07/2015 07:13   Dg Chest Port 1 View  03/07/2015  CLINICAL DATA:  Central line placement. EXAM: PORTABLE CHEST 1 VIEW COMPARISON:  03/06/2015 FINDINGS: Cardiac pacemaker. Interval placement of a right central venous catheter. Tip overlies the cavoatrial junction. Shallow inspiration. Persistent bilateral perihilar infiltrates, greater on the right. No pneumothorax. No blunting of costophrenic angles. Cardiac enlargement. IMPRESSION: Right central venous catheter with tip over the cavoatrial junction. Bilateral pulmonary airspace disease. Electronically Signed   By: Lucienne Capers M.D.   On: 03/07/2015 01:26   Dg Chest Port 1 View  03/06/2015  CLINICAL DATA:  Sudden onset shortness of breath with altered mental status tonight. Respiratory distress. EXAM: PORTABLE CHEST 1 VIEW COMPARISON:  03/03/2015 FINDINGS: Cardiac pacemaker. Shallow inspiration. Mild cardiac enlargement. There is bilateral perihilar airspace disease, greater on the right. This may be due to pulmonary edema or pneumonia. No blunting of costophrenic angles. No pneumothorax. Calcified and tortuous aorta. IMPRESSION: Bilateral perihilar airspace disease may indicate pulmonary edema or pneumonia. Electronically Signed   By: Lucienne Capers M.D.   On: 03/06/2015 20:27   Dg Chest Portable 1 View  03/01/2015  CLINICAL DATA:  65 year old female with shortness of breath and chest pain radiating from the sternum to the left side. Myocardial infarction 2 weeks ago. Former smoker. Initial encounter. EXAM: PORTABLE CHEST 1  VIEW COMPARISON:  02/22/2015 and earlier. FINDINGS: Portable AP upright view at 0846 hours. Increased basilar predominant interstitial opacity since the prior. No pneumothorax or pleural effusion. Mildly lower lung volumes. No consolidation. Stable cardiomegaly and mediastinal contours. Stable left chest cardiac AICD. Visualized tracheal air column is within normal limits. IMPRESSION: Increased basilar predominant interstitial opacity, favor acute interstitial edema. No other acute cardiopulmonary abnormality. Electronically Signed   By: Genevie [name] M.D.   On: 03/01/2015 09:10   Dg Abd Portable 1v  03/08/2015  CLINICAL DATA:  Abdominal pain with nausea and vomiting EXAM: PORTABLE ABDOMEN - 1 VIEW COMPARISON:  February 26, 2015 CT abdomen and pelvis FINDINGS: There is moderate stool in the colon. There is no bowel dilatation or air-fluid level suggesting obstruction. No free air. There is vascular calcification in the pelvis. Lung bases are clear. Pacemaker leads are attached to the right atrium and right ventricle. IMPRESSION: Bowel gas pattern unremarkable. No obstruction or free air. Lung bases clear. Electronically Signed   By: Lowella Grip III M.D.   On: 03/08/2015 19:36     CBC  Recent Labs Lab 03/30/15 2015  WBC 5.9  HGB 9.5*  HCT 27.2*  PLT 235  MCV 99.3  MCH 34.7*  MCHC 34.9  RDW 19.4*  LYMPHSABS 1.4  MONOABS 0.8  EOSABS 0.0  BASOSABS 0.0    Chemistries   Recent Labs Lab 03/30/15 2015  NA 143  K 3.1*  CL 103  CO2 28  GLUCOSE 112*  BUN 33*  CREATININE 2.34*  CALCIUM 9.0  AST 71*  ALT 54  ALKPHOS 68  BILITOT 1.0   ------------------------------------------------------------------------------------------------------------------ estimated creatinine clearance is 26.6 mL/min (by C-G formula based on Cr of 2.34). ------------------------------------------------------------------------------------------------------------------ No results for input(s): HGBA1C in the  last 72 hours. ------------------------------------------------------------------------------------------------------------------ No results for input(s): CHOL, HDL, LDLCALC, TRIG, CHOLHDL, LDLDIRECT in the last 72 hours. ------------------------------------------------------------------------------------------------------------------ No results for input(s): TSH, T4TOTAL, T3FREE, THYROIDAB in the last 72 hours.  Invalid input(s): FREET3 ------------------------------------------------------------------------------------------------------------------ No results for input(s): VITAMINB12, FOLATE, FERRITIN, TIBC, IRON, RETICCTPCT in the last 72 hours.  Coagulation profile  Recent Labs Lab 03/30/15 2015  INR 1.88*    No results for input(s): DDIMER in the last 72 hours.  Cardiac Enzymes  No results for input(s): CKMB, TROPONINI, MYOGLOBIN in the last 168 hours.  Invalid input(s): CK ------------------------------------------------------------------------------------------------------------------ Invalid input(s): POCBNP   CBG:  Recent Labs Lab 03/30/15 2305  GLUCAP 81       EKG: Independently reviewed. Sinus rhythm with signs of LVH  Assessment/Plan Active Problems: Possible GI bleed: Acute patient reports guaiac positive stools for which she was instructed to come to the emergency department. INR is slightly increased. Patient currently on Eliquis for paroxysmal atrial fibrillation H&H is. Be stable we'll continue to monitor. - Admit to a telemetry bed - Continue to monitor H&H - Protonix - Consult GI in a.m. needed  CAD S/P prior RCA PCI, cath 05/03/2013- med Rx, STEMI 01/2015 - 100% prox PDA, treated medically - Continue aspirin and Plavix  PAF with ischemic cardiomyopathy and congestive heart failure: - Continue  eliquis and amiodarone  HTN (hypertension): Stable - continue coreg, lasix, toresemide, hydralazine, isosorbide mononitrate.  Cough with hemoptysis:  Although patient reports continued hemoptysis H&H remains stable. - continue to monitor  Anemia: Stable - Check CBC in a.m.   Diabetes mellitus type 2 in obese (HCC) - Discontinued oral hypoglycemic agents of glipizide - CBG q 4 hours with sliding scale insulin  Hypothyroidism - Continue levothyroxine  Chronic kidney disease stage IV: Stable. Patient with a creatinine of 2.34 on admission it appears that her baseline creatinine 2.2-2.5  - Continue to monitor  Anxiety - Continue and Paxil and Xanax prn  Chronic Gerd with history of hiatal hernia - Continue Protonix and Carafate  Hyperlipidemia - Continue atorvastatin     Code Status:   full Family Communication: bedside Disposition Plan: admit   Total time spent 55 minutes.Greater than 50% of this time was spent in counseling, explanation of diagnosis, planning of further management, and coordination of care  [facility] Pager [phone]  If 7PM-7AM, please contact night-coverage [email or web address] Password [email or web address] 03/31/2015, 12:16 AM

## 2015-03-31 NOTE — Progress Notes (Signed)
   Subjective:    Patient ID: [name] Terry, female    DOB: 1951/03/16, 64 y.o.   MRN: [id number]  HPI         Review of Systems     Objective:   Physical Exam        Assessment & Plan:

## 2015-03-31 NOTE — Progress Notes (Signed)
ICM transmission received on 03/30/2015 but she is currently in hospital with GI symptoms.  She has an office appointment with Dr Rayann Heman on 04/14/2015.   ICM transmission rescheduled for 04/05/2015.

## 2015-04-01 LAB — GLUCOSE, CAPILLARY
GLUCOSE-CAPILLARY: 71 mg/dL (ref 65–99)
Glucose-Capillary: 82 mg/dL (ref 65–99)
Glucose-Capillary: 101 mg/dL — ABNORMAL HIGH (ref 65–99)
Glucose-Capillary: 115 mg/dL — ABNORMAL HIGH (ref 65–99)

## 2015-04-01 LAB — CBC WITH DIFFERENTIAL/PLATELET
BASOS ABS: 0 10*3/uL (ref 0.0–0.1)
BASOS PCT: 0 %
Eosinophils Absolute: 0.1 10*3/uL (ref 0.0–0.7)
Eosinophils Relative: 1 %
HEMATOCRIT: 25.3 % — AB (ref 36.0–46.0)
Hemoglobin: 8.2 g/dL — ABNORMAL LOW (ref 12.0–15.0)
LYMPHS PCT: 24 %
Lymphs Abs: 1.1 10*3/uL (ref 0.7–4.0)
MCH: 32.7 pg (ref 26.0–34.0)
MCHC: 32.4 g/dL (ref 30.0–36.0)
MCV: 100.8 fL — AB (ref 78.0–100.0)
MONO ABS: 0.4 10*3/uL (ref 0.1–1.0)
Monocytes Relative: 9 %
NEUTROS ABS: 3.1 10*3/uL (ref 1.7–7.7)
NEUTROS PCT: 66 %
Platelets: 195 10*3/uL (ref 150–400)
RBC: 2.51 MIL/uL — AB (ref 3.87–5.11)
RDW: 20.1 % — AB (ref 11.5–15.5)
WBC: 4.6 10*3/uL (ref 4.0–10.5)

## 2015-04-01 LAB — COMPREHENSIVE METABOLIC PANEL
ALBUMIN: 3 g/dL — AB (ref 3.5–5.0)
ALT: 47 U/L (ref 14–54)
AST: 63 U/L — AB (ref 15–41)
Alkaline Phosphatase: 56 U/L (ref 38–126)
Anion gap: 11 (ref 5–15)
BILIRUBIN TOTAL: 1 mg/dL (ref 0.3–1.2)
BUN: 34 mg/dL — AB (ref 6–20)
CHLORIDE: 106 mmol/L (ref 101–111)
CO2: 24 mmol/L (ref 22–32)
CREATININE: 2.23 mg/dL — AB (ref 0.44–1.00)
Calcium: 8.4 mg/dL — ABNORMAL LOW (ref 8.9–10.3)
GFR calc Af Amer: 26 mL/min — ABNORMAL LOW (ref >60)
GFR, EST NON AFRICAN AMERICAN: 22 mL/min — AB (ref >60)
GLUCOSE: 81 mg/dL (ref 65–99)
POTASSIUM: 3.4 mmol/L — AB (ref 3.5–5.1)
Sodium: 141 mmol/L (ref 135–145)
TOTAL PROTEIN: 6.3 g/dL — AB (ref 6.5–8.1)

## 2015-04-01 LAB — PROTIME-INR
INR: 2.05 — ABNORMAL HIGH (ref 0.00–1.49)
Prothrombin Time: 23 seconds — ABNORMAL HIGH (ref 11.6–15.2)

## 2015-04-01 LAB — PHOSPHORUS: Phosphorus: 3.4 mg/dL (ref 2.5–4.6)

## 2015-04-01 LAB — MAGNESIUM: Magnesium: 1.9 mg/dL (ref 1.7–2.4)

## 2015-04-01 MED ORDER — ASPIRIN EC 325 MG PO TBEC
325.0000 mg | DELAYED_RELEASE_TABLET | Freq: Every day | ORAL | Status: DC
  Administered 2015-04-02 – 2015-04-06 (×5): 325 mg via ORAL
  Filled 2015-04-01 (×5): qty 1

## 2015-04-01 MED ORDER — POTASSIUM CHLORIDE 20 MEQ/15ML (10%) PO SOLN
40.0000 meq | Freq: Once | ORAL | Status: AC
  Administered 2015-04-01: 40 meq via ORAL
  Filled 2015-04-01: qty 30

## 2015-04-01 NOTE — Progress Notes (Signed)
[facility] PROGRESS NOTE  [name] Terry [id number] DOB: Dec 09, 1950 DOA: 03/30/2015 PCP: Pcp Not In System  Summary 03/31/15: Multiple medical issues- dvt/pe afib, anticoagulated, gi bleed recent, coronary stents. Came in with melena. Had extensive discussion with patient and daughter at bedside. Patient does not seem to have good grasp of the complexity of her condition. Recommended IVC filter, and tos top anticoagulation, have gi perform another endoscopy. Will discontinue Eliquis/Plavix, continue ASA in view of CAD with coronary stents which are more than a year old. Will request IR guided IVC filter tomorrow once patient sufficiently off Eliquis and ask GI to see-?Can source of bleeding be arrested for patient to resume anticoagulation safely in future. Will need prbc transfusion if Hb drops below 8g/dl in view of cardiac issues. Unfortunately, no risk free choices for her and I have explained this to her and her daughter. 04/01/15: Appreciate IR. Discussed with interventional radiology regarding IVC filter placement. Please refer to IR notes for rational decision. Consulted GI. Dr. Watt Climes will see patient in consult in a.m. CT abdomen pelvis unrevealing. Patient complains of abdominal pain but she has not had bowel movement overnight. Will increase dose of aspirin to 325 mg daily for now, and advance diet to Consistent. Plan GI bleed  Continue protonix  Transfuse prbc if Hb drops below 8g/dl  Gi to see in a.m.  Advance diet to Consistent DVT  No IVC filter for now  Increase aspirin to 325 mg daily  Keep holding Plavix/Eliquis Chronic systolic CHF (congestive heart failure) (HCC)/HTN (hypertension)/HLD (hyperlipidemia)/Cardiomyopathy, ischemic-EF 35-40%/CKD (chronic kidney disease) stage 3, GFR 30-59 ml/min/Diabetes mellitus type 2 in obese (HCC)/GERD (gastroesophageal reflux disease)/Anxiety state/Hypothyroidism  Continue current mx  Code Status: Full Code Family Communication:  Daughter at bedside Disposition Plan: ?Eventually home  HPI/Subjective: Complains of abdominal pain  Objective: Filed Vitals:   04/01/15 0354 04/01/15 1419  BP: 117/70 107/60  Pulse: 63 63  Temp: 98.5 F (36.9 C) 98.4 F (36.9 C)  Resp: 18 20    Intake/Output Summary (Last 24 hours) at 04/01/15 1821 Last data filed at 04/01/15 1700  Gross per 24 hour  Intake 3258.25 ml  Output      0 ml  Net 3258.25 ml   Filed Weights   03/30/15 2337  Weight: 85.9 kg (189 lb 6 oz)    Exam:   General:  Comfortable at rest.  Cardiovascular: S1-S2 normal. No murmurs. Pulse regular.  Respiratory: Good air entry bilaterally. No rhonchi or rales.  Abdomen: Soft and nontender. Normal bowel sounds. No organomegaly.  Musculoskeletal: No pedal edema   Neurological: Intact  Data Reviewed: Basic Metabolic Panel:  Recent Labs Lab 03/30/15 2015 03/31/15 0620 04/01/15 0607  NA 143 144 141  K 3.1* 3.0* 3.4*  CL 103 107 106  CO2 28 27 24   GLUCOSE 112* 97 81  BUN 33* 32* 34*  CREATININE 2.34* 2.38* 2.23*  CALCIUM 9.0 8.7* 8.4*  MG  --   --  1.9  PHOS  --   --  3.4   Liver Function Tests:  Recent Labs Lab 03/30/15 2015 04/01/15 0607  AST 71* 63*  ALT 54 47  ALKPHOS 68 56  BILITOT 1.0 1.0  PROT 7.6 6.3*  ALBUMIN 3.6 3.0*    Recent Labs Lab 03/30/15 2015  LIPASE 61*   No results for input(s): AMMONIA in the last 168 hours. CBC:  Recent Labs Lab 03/30/15 2015 03/31/15 0035 03/31/15 0620 04/01/15 0607  WBC 5.9  --  4.0  4.6  NEUTROABS 3.9  --   --  3.1  HGB 9.5* 9.7* 8.6* 8.2*  HCT 27.2* 29.4* 23.7* 25.3*  MCV 99.3  --  103.5* 100.8*  PLT 235  --  197 195   Cardiac Enzymes:  Recent Labs Lab 03/31/15 0035 03/31/15 0620  TROPONINI 0.05* 0.04*   BNP (last 3 results)  Recent Labs  02/20/15 1645 02/21/15 1042 03/01/15 0839  BNP 463.9* 386.1* 452.0*    ProBNP (last 3 results) No results for input(s): PROBNP in the last 8760  hours.  CBG:  Recent Labs Lab 03/31/15 1145 03/31/15 1707 04/01/15 0030 04/01/15 0824 04/01/15 1218  GLUCAP 86 93 71 82 101*    No results found for this or any previous visit (from the past 240 hour(s)).   Studies: Ct Abdomen Pelvis Wo Contrast  03/31/2015  CLINICAL DATA:  Black stools since Christmas. Intermittent aching epigastric pain starting after Christmas, worsened with eating. Guaiac positive stools at the PCP office reportedly. Associated symptoms of shortness of breath, intermittent chest pain, hemoptysis and anxiety. EXAM: CT ABDOMEN AND PELVIS WITHOUT CONTRAST TECHNIQUE: Multidetector CT imaging of the abdomen and pelvis was performed following the standard protocol without IV contrast. COMPARISON:  CT abdomen and pelvis dated 03/10/2015. FINDINGS: The lung bases are now clear. Interval resolution of the small right pleural effusion seen on earlier CT. Cardiomegaly appears grossly stable, incompletely imaged. Bowel is normal in caliber. Scattered diverticulosis noted throughout the descending and sigmoid colon without evidence of acute diverticulitis. No bowel wall thickening or evidence of bowel wall inflammation identified. Appendix is normal. Stomach is unremarkable. No free fluid or abscess collection identified. No free intraperitoneal air. Liver, spleen, pancreas, gallbladder, and adrenal glands are within normal limits for a noncontrast study. Bilateral renal cysts again noted, difficult to definitively characterize without intravascular contrast, but stable compared to multiple prior studies. Left kidney is somewhat atrophic, stable. No renal stone or hydronephrosis. No ureteral or bladder calculi identified. Adrenal glands are unremarkable. Heavy atherosclerotic changes are seen along the walls of the normal-caliber abdominal aorta and branch vessels. Bilateral renal artery stents in place. Adnexal regions are unremarkable. Scattered degenerative changes are seen throughout the  thoracolumbar spine but no acute osseous abnormality. Superficial soft tissues are unremarkable. IMPRESSION: 1. Colonic diverticulosis without evidence of acute diverticulitis. 2. Overall, no evidence of acute intra-abdominal or intrapelvic abnormality identified. Chronic/incidental findings detailed above, including extensive atherosclerotic changes. Electronically Signed   By: Franki Cabot M.D.   On: 03/31/2015 17:50   Dg Chest Port 1 View  03/31/2015  CLINICAL DATA:  Sudden onset of upper abdominal and chest pain. Nausea. EXAM: PORTABLE CHEST 1 VIEW COMPARISON:  03/08/2015 FINDINGS: Dual lead left-sided pacemaker remains in place. Cardiomegaly is again seen, unchanged. Improved bilateral opacities from prior exam without new confluent airspace disease. Mild vascular congestion. No large pleural effusion or pneumothorax. IMPRESSION: Improved aeration from prior exam with near complete resolution of previous bilateral opacities. Mild vascular congestion and stable cardiomegaly. Electronically Signed   By: Jeb Levering M.D.   On: 03/31/2015 02:48   Dg Abd Portable 1v  03/31/2015  CLINICAL DATA:  Acute onset of severe upper abdominal pain and generalized chest pain. Nausea. Initial encounter. EXAM: PORTABLE ABDOMEN - 1 VIEW COMPARISON:  CT of the abdomen and pelvis performed 03/10/2015 FINDINGS: The visualized bowel gas pattern is unremarkable. Scattered air and stool filled loops of colon are seen; no abnormal dilatation of small bowel loops is seen to suggest small bowel  obstruction. No free intra-abdominal air is identified, though evaluation for free air is limited on a single supine view. Mild degenerative change is noted at the lower lumbar spine; the sacroiliac joints are unremarkable in appearance. The visualized lung bases are essentially clear. Pacemaker/AICD leads are partially imaged. IMPRESSION: Unremarkable bowel gas pattern; no free intra-abdominal air seen. Small amount of stool noted in the  colon. Electronically Signed   By: Garald Balding M.D.   On: 03/31/2015 02:46    Scheduled Meds: . amiodarone  200 mg Oral BID  . [START ON 04/02/2015] aspirin EC  325 mg Oral Daily  . atorvastatin  40 mg Oral q1800  . carvedilol  9.375 mg Oral BID WC  . insulin aspart  0-9 Units Subcutaneous TID WC  . isosorbide mononitrate  60 mg Oral Daily  . levothyroxine  50 mcg Oral QAC breakfast  . multivitamin with minerals  1 tablet Oral Daily  . pantoprazole (PROTONIX) IV  40 mg Intravenous Q12H  . PARoxetine  10 mg Oral Daily  . potassium chloride SA  20 mEq Oral Daily  . sodium chloride  3 mL Intravenous Q12H  . sucralfate  1 g Oral TID WC & HS  . torsemide  20 mg Oral Daily   Continuous Infusions:    Time spent: 25 minutes    Shelbe Haglund  [facility] Pager [phone]. If 7PM-7AM, please contact night-coverage at [email or web address], password [facility] 04/01/2015, 6:21 PM  LOS: 2 days

## 2015-04-01 NOTE — Progress Notes (Signed)
Request seen for placement of IVC filter for small right peroneal vein DVT.  Chart reviewed and discussed with Dr. Laurence Ferrari.  Recommend hold off on IVC filter at this time and repeat venous doppler in one week to make sure clot does not propagate.  Then, repeat venous doppler in 4-6 weeks.  This recommendation was communicated to Dr. Sanjuana Letters.  Christien Frankl S Macon Lesesne PA-C 04/01/2015 12:16 PM

## 2015-04-02 LAB — CBC
HCT: 23 % — ABNORMAL LOW (ref 36.0–46.0)
Hemoglobin: 7.7 g/dL — ABNORMAL LOW (ref 12.0–15.0)
MCH: 34.2 pg — ABNORMAL HIGH (ref 26.0–34.0)
MCHC: 33.5 g/dL (ref 30.0–36.0)
MCV: 102.2 fL — AB (ref 78.0–100.0)
PLATELETS: 196 10*3/uL (ref 150–400)
RBC: 2.25 MIL/uL — AB (ref 3.87–5.11)
RDW: 20.5 % — AB (ref 11.5–15.5)
WBC: 4.9 10*3/uL (ref 4.0–10.5)

## 2015-04-02 LAB — COMPREHENSIVE METABOLIC PANEL
ALBUMIN: 2.8 g/dL — AB (ref 3.5–5.0)
ALT: 49 U/L (ref 14–54)
ANION GAP: 7 (ref 5–15)
AST: 61 U/L — AB (ref 15–41)
Alkaline Phosphatase: 54 U/L (ref 38–126)
BILIRUBIN TOTAL: 0.7 mg/dL (ref 0.3–1.2)
BUN: 33 mg/dL — AB (ref 6–20)
CHLORIDE: 108 mmol/L (ref 101–111)
CO2: 25 mmol/L (ref 22–32)
Calcium: 8.6 mg/dL — ABNORMAL LOW (ref 8.9–10.3)
Creatinine, Ser: 2.34 mg/dL — ABNORMAL HIGH (ref 0.44–1.00)
GFR calc Af Amer: 24 mL/min — ABNORMAL LOW (ref >60)
GFR, EST NON AFRICAN AMERICAN: 21 mL/min — AB (ref >60)
Glucose, Bld: 101 mg/dL — ABNORMAL HIGH (ref 65–99)
POTASSIUM: 3.4 mmol/L — AB (ref 3.5–5.1)
SODIUM: 140 mmol/L (ref 135–145)
TOTAL PROTEIN: 6.1 g/dL — AB (ref 6.5–8.1)

## 2015-04-02 LAB — PROTIME-INR
INR: 1.66 — AB (ref 0.00–1.49)
PROTHROMBIN TIME: 19.6 s — AB (ref 11.6–15.2)

## 2015-04-02 LAB — MAGNESIUM: Magnesium: 1.9 mg/dL (ref 1.7–2.4)

## 2015-04-02 LAB — GLUCOSE, CAPILLARY
GLUCOSE-CAPILLARY: 133 mg/dL — AB (ref 65–99)
GLUCOSE-CAPILLARY: 130 mg/dL — AB (ref 65–99)
Glucose-Capillary: 84 mg/dL (ref 65–99)
Glucose-Capillary: 161 mg/dL — ABNORMAL HIGH (ref 65–99)
Glucose-Capillary: 89 mg/dL (ref 65–99)

## 2015-04-02 LAB — PREPARE RBC (CROSSMATCH)

## 2015-04-02 LAB — PHOSPHORUS: PHOSPHORUS: 3.2 mg/dL (ref 2.5–4.6)

## 2015-04-02 MED ORDER — PEG 3350-KCL-NA BICARB-NACL 420 G PO SOLR
4000.0000 mL | Freq: Once | ORAL | Status: AC
  Administered 2015-04-02: 4000 mL via ORAL

## 2015-04-02 MED ORDER — FUROSEMIDE 10 MG/ML IJ SOLN
20.0000 mg | Freq: Once | INTRAMUSCULAR | Status: AC
  Administered 2015-04-02: 20 mg via INTRAVENOUS
  Filled 2015-04-02: qty 2

## 2015-04-02 MED ORDER — SODIUM CHLORIDE 0.9 % IV SOLN
INTRAVENOUS | Status: DC
  Administered 2015-04-03: 12:00:00 via INTRAVENOUS

## 2015-04-02 MED ORDER — SODIUM CHLORIDE 0.9 % IV SOLN
Freq: Once | INTRAVENOUS | Status: AC
  Administered 2015-04-02: 14:00:00 via INTRAVENOUS

## 2015-04-02 MED ORDER — LEVOTHYROXINE SODIUM 100 MCG PO TABS
100.0000 ug | ORAL_TABLET | Freq: Every day | ORAL | Status: DC
  Administered 2015-04-03 – 2015-04-08 (×6): 100 ug via ORAL
  Filled 2015-04-02 (×6): qty 1

## 2015-04-02 NOTE — Progress Notes (Signed)
[facility] PROGRESS NOTE  [name] Terry [id number] DOB: 07-24-1950 DOA: 03/30/2015 PCP: Pcp Not In System  Summary 03/31/15: Multiple medical issues- dvt/pe afib, anticoagulated, gi bleed recent, coronary stents. Came in with melena. Had extensive discussion with patient and daughter at bedside. Patient does not seem to have good grasp of the complexity of her condition. Recommended IVC filter, and tos top anticoagulation, have gi perform another endoscopy. Will discontinue Eliquis/Plavix, continue ASA in view of CAD with coronary stents which are more than a year old. Will request IR guided IVC filter tomorrow once patient sufficiently off Eliquis and ask GI to see-?Can source of bleeding be arrested for patient to resume anticoagulation safely in future. Will need prbc transfusion if Hb drops below 8g/dl in view of cardiac issues. Unfortunately, no risk free choices for her and I have explained this to her and her daughter. 04/01/15: Appreciate IR. Discussed with interventional radiology regarding IVC filter placement. Please refer to IR notes for rational decision. Consulted GI. Dr. Watt Climes will see patient in consult in a.m. CT abdomen pelvis unrevealing. Patient complains of abdominal pain but she has not had bowel movement overnight. Will increase dose of aspirin to 325 mg daily for now, and advance diet to Consistent. 04/02/15: Appreciate GI. Hemoglobin has dropped to 7.7 g/dl. We'll therefore transfuse 2 units PRBC and give Lasix in between units. Noted that patient's TSH was 17 last month. We'll therefore increase dose of Synthroid from 50 g 200 g daily. Patient to have EGD/colonoscopy in a.m. per GI. Plan GI bleed  Continue protonix  Transfuse 2 units prbc and give Lasix 20 mg IV in between units  Nothing by mouth past midnight for endoscopy  Diet per GI DVT  No IVC filter for now  Continue aspirin 325 mg daily  Keep holding Plavix/Eliquis Chronic systolic CHF (congestive heart  failure) (HCC)/HTN (hypertension)/HLD (hyperlipidemia)/Cardiomyopathy, ischemic-EF 35-40%/CKD (chronic kidney disease) stage 3, GFR 30-59 ml/min/Diabetes mellitus type 2 in obese (HCC)/GERD (gastroesophageal reflux disease)/Anxiety state/Hypothyroidism  Increase Synthroid to 100 g daily  Code Status: Full Code Family Communication: Daughter  Disposition Plan: ?Eventually home   HPI/Subjective: Complains of abdominal pain  Objective: Filed Vitals:   04/02/15 1733 04/02/15 1805  BP: 126/73 106/81  Pulse: 66 64  Temp: 98.6 F (37 C) 98.4 F (36.9 C)  Resp: 18 18    Intake/Output Summary (Last 24 hours) at 04/02/15 2038 Last data filed at 04/02/15 1900  Gross per 24 hour  Intake   1560 ml  Output   1300 ml  Net    260 ml   Filed Weights   03/30/15 2337 04/02/15 0551  Weight: 85.9 kg (189 lb 6 oz) 87.1 kg (192 lb 0.3 oz)    Exam:   General:  Comfortable at rest.  Cardiovascular: S1-S2 normal. No murmurs. Pulse regular.  Respiratory: Good air entry bilaterally. No rhonchi or rales.  Abdomen: Soft and nontender. Normal bowel sounds. No organomegaly.  Musculoskeletal: No pedal edema   Neurological: Intact  Data Reviewed: Basic Metabolic Panel:  Recent Labs Lab 03/30/15 2015 03/31/15 0620 04/01/15 0607 04/02/15 0553  NA 143 144 141 140  K 3.1* 3.0* 3.4* 3.4*  CL 103 107 106 108  CO2 28 27 24 25   GLUCOSE 112* 97 81 101*  BUN 33* 32* 34* 33*  CREATININE 2.34* 2.38* 2.23* 2.34*  CALCIUM 9.0 8.7* 8.4* 8.6*  MG  --   --  1.9 1.9  PHOS  --   --  3.4 3.2  Liver Function Tests:  Recent Labs Lab 03/30/15 2015 04/01/15 0607 04/02/15 0553  AST 71* 63* 61*  ALT 54 47 49  ALKPHOS 68 56 54  BILITOT 1.0 1.0 0.7  PROT 7.6 6.3* 6.1*  ALBUMIN 3.6 3.0* 2.8*    Recent Labs Lab 03/30/15 2015  LIPASE 61*   No results for input(s): AMMONIA in the last 168 hours. CBC:  Recent Labs Lab 03/30/15 2015 03/31/15 0035 03/31/15 0620 04/01/15 0607  04/02/15 0553  WBC 5.9  --  4.0 4.6 4.9  NEUTROABS 3.9  --   --  3.1  --   HGB 9.5* 9.7* 8.6* 8.2* 7.7*  HCT 27.2* 29.4* 23.7* 25.3* 23.0*  MCV 99.3  --  103.5* 100.8* 102.2*  PLT 235  --  197 195 196   Cardiac Enzymes:  Recent Labs Lab 03/31/15 0035 03/31/15 0620  TROPONINI 0.05* 0.04*   BNP (last 3 results)  Recent Labs  02/20/15 1645 02/21/15 1042 03/01/15 0839  BNP 463.9* 386.1* 452.0*    ProBNP (last 3 results) No results for input(s): PROBNP in the last 8760 hours.  CBG:  Recent Labs Lab 04/01/15 1218 04/01/15 2219 04/02/15 0748 04/02/15 1214 04/02/15 1640  GLUCAP 101* 115* 84 161* 130*    No results found for this or any previous visit (from the past 240 hour(s)).   Studies: No results found.  Scheduled Meds: . amiodarone  200 mg Oral BID  . aspirin EC  325 mg Oral Daily  . atorvastatin  40 mg Oral q1800  . carvedilol  9.375 mg Oral BID WC  . insulin aspart  0-9 Units Subcutaneous TID WC  . isosorbide mononitrate  60 mg Oral Daily  . [START ON 04/03/2015] levothyroxine  100 mcg Oral QAC breakfast  . multivitamin with minerals  1 tablet Oral Daily  . pantoprazole (PROTONIX) IV  40 mg Intravenous Q12H  . PARoxetine  10 mg Oral Daily  . potassium chloride SA  20 mEq Oral Daily  . sodium chloride  3 mL Intravenous Q12H  . sucralfate  1 g Oral TID WC & HS  . torsemide  20 mg Oral Daily   Continuous Infusions: . sodium chloride       Time spent: 25 minutes    Terrilee Dudzik  [facility] Pager [phone]. If 7PM-7AM, please contact night-coverage at [email or web address], password [facility] 04/02/2015, 8:38 PM  LOS: 3 days

## 2015-04-02 NOTE — Progress Notes (Signed)
[name] Terry 12:18 PM  Subjective: Patient seen and examined and discussed with primary team and known to me from a December admission with atypical chest pain and her hospital computer chart was reviewed and she has tended to have dark stools but occasionally will see some bright red blood and his never had a colonoscopy and she did have an endoscopy last year in [place] but has been on multiple blood thinners and morphine helps her chronic pain and she has had multiple hospital admissions and multiple facilities over the last year but supposedly her family history from a GI standpoint is negative  Objective: Vital signs stable afebrile no acute distress lungs are clear heart decreased heart sounds abdomen is soft nontender good bowel sounds labs reviewed very slight hemoglobin decreased BUN and creatinine essentially unchanged abdominal CT multiple times okay  Assessment: Multiple medical problems in a patient with guaiac positive anemia on multiple blood thinners  Plan: She will need cardiology to assess her minimal blood thinner needs and in the meantime my partner Dr. Oletta Lamas will proceed with a colonoscopy and endoscopy tomorrow which was discussed with the patient and she agrees with further workup and plans pending those findings  [facility] E  Pager [phone] After 5PM or if no answer call [phone]

## 2015-04-03 ENCOUNTER — Encounter (HOSPITAL_COMMUNITY): Payer: Self-pay

## 2015-04-03 ENCOUNTER — Inpatient Hospital Stay (HOSPITAL_COMMUNITY): Payer: Medicare Other | Admitting: Anesthesiology

## 2015-04-03 ENCOUNTER — Encounter (HOSPITAL_COMMUNITY)
Admission: EM | Disposition: A | Discharge status: Home Health | Payer: Self-pay | Source: Ambulatory Visit | Attending: Internal Medicine

## 2015-04-03 HISTORY — PX: ESOPHAGOGASTRODUODENOSCOPY (EGD) WITH PROPOFOL: SHX5813

## 2015-04-03 LAB — COMPREHENSIVE METABOLIC PANEL
ALBUMIN: 2.9 g/dL — AB (ref 3.5–5.0)
ALK PHOS: 60 U/L (ref 38–126)
ALT: 59 U/L — ABNORMAL HIGH (ref 14–54)
ANION GAP: 10 (ref 5–15)
AST: 74 U/L — ABNORMAL HIGH (ref 15–41)
BUN: 31 mg/dL — ABNORMAL HIGH (ref 6–20)
CALCIUM: 8.5 mg/dL — AB (ref 8.9–10.3)
CO2: 24 mmol/L (ref 22–32)
Chloride: 107 mmol/L (ref 101–111)
Creatinine, Ser: 2.41 mg/dL — ABNORMAL HIGH (ref 0.44–1.00)
GFR calc non Af Amer: 20 mL/min — ABNORMAL LOW (ref >60)
GFR, EST AFRICAN AMERICAN: 23 mL/min — AB (ref >60)
GLUCOSE: 82 mg/dL (ref 65–99)
POTASSIUM: 3.6 mmol/L (ref 3.5–5.1)
SODIUM: 141 mmol/L (ref 135–145)
Total Bilirubin: 1.9 mg/dL — ABNORMAL HIGH (ref 0.3–1.2)
Total Protein: 6.2 g/dL — ABNORMAL LOW (ref 6.5–8.1)

## 2015-04-03 LAB — TYPE AND SCREEN
ABO/RH(D): A POS
Antibody Screen: NEGATIVE
UNIT DIVISION: 0
UNIT DIVISION: 0

## 2015-04-03 LAB — CBC
HCT: 31.4 % — ABNORMAL LOW (ref 36.0–46.0)
Hemoglobin: 10.2 g/dL — ABNORMAL LOW (ref 12.0–15.0)
MCH: 31.1 pg (ref 26.0–34.0)
MCHC: 32.5 g/dL (ref 30.0–36.0)
MCV: 95.7 fL (ref 78.0–100.0)
PLATELETS: 181 10*3/uL (ref 150–400)
RBC: 3.28 MIL/uL — ABNORMAL LOW (ref 3.87–5.11)
RDW: 21.1 % — AB (ref 11.5–15.5)
WBC: 5.1 10*3/uL (ref 4.0–10.5)

## 2015-04-03 LAB — GLUCOSE, CAPILLARY
GLUCOSE-CAPILLARY: 82 mg/dL (ref 65–99)
GLUCOSE-CAPILLARY: 137 mg/dL — AB (ref 65–99)

## 2015-04-03 LAB — PROTIME-INR
INR: 1.44 (ref 0.00–1.49)
Prothrombin Time: 17.6 seconds — ABNORMAL HIGH (ref 11.6–15.2)

## 2015-04-03 SURGERY — ESOPHAGOGASTRODUODENOSCOPY (EGD) WITH PROPOFOL
Anesthesia: Monitor Anesthesia Care

## 2015-04-03 MED ORDER — LIDOCAINE HCL (CARDIAC) 20 MG/ML IV SOLN
INTRAVENOUS | Status: AC
  Filled 2015-04-03: qty 5

## 2015-04-03 MED ORDER — PHENYLEPHRINE HCL 10 MG/ML IJ SOLN
INTRAMUSCULAR | Status: AC
  Filled 2015-04-03: qty 1

## 2015-04-03 MED ORDER — SODIUM CHLORIDE 0.9 % IV SOLN: Freq: Once | INTRAVENOUS | Status: DC

## 2015-04-03 MED ORDER — PROPOFOL 10 MG/ML IV BOLUS
INTRAVENOUS | Status: AC
  Filled 2015-04-03: qty 40

## 2015-04-03 MED ORDER — PROPOFOL 10 MG/ML IV BOLUS
INTRAVENOUS | Status: DC | PRN
  Administered 2015-04-03: 40 mg via INTRAVENOUS

## 2015-04-03 MED ORDER — PROPOFOL 500 MG/50ML IV EMUL
INTRAVENOUS | Status: DC | PRN
  Administered 2015-04-03: 100 ug/kg/min via INTRAVENOUS

## 2015-04-03 SURGICAL SUPPLY — 24 items

## 2015-04-03 NOTE — Interval H&P Note (Signed)
History and Physical Interval Note:  04/03/2015 12:43 PM  [name] Terry  has presented today for surgery, with the diagnosis of GI Bleed  The various methods of treatment have been discussed with the patient and family. After consideration of risks, benefits and other options for treatment, the patient has consented to  Procedure(s): ESOPHAGOGASTRODUODENOSCOPY (EGD) WITH PROPOFOL (N/A) as a surgical intervention .  The patient's history has been reviewed, patient examined, no change in status, stable for surgery.  I have reviewed the patient's chart and labs.  Questions were answered to the patient's satisfaction.     Brittany Osier JR,Tasheika Kitzmiller L

## 2015-04-03 NOTE — Op Note (Signed)
[place], [id number]   ENDOSCOPY PROCEDURE REPORT  PATIENT: [name] Terry, [name] Terry  MR#: [id number] BIRTHDATE: 1950-06-11 , 73  yrs. old GENDER: female ENDOSCOPIST:Benno Brensinger Oletta Lamas, MD REFERRED BY: [facility] PROCEDURE DATE:  04-18-2015 PROCEDURE:   EGD ASA CLASS:    class IV INDICATIONS: anemia,positive stool and history of dark stool. MEDICATION:p ropofol 140 mg IV TOPICAL ANESTHETIC:  DESCRIPTION OF PROCEDURE:   After the risks and benefits of the procedure were explained, informed consent was obtained.  The Pentax Gastroscope [id number]  endoscope was introduced through the mouth  and advanced to the second portion of the duodenum .  The instrument was slowly withdrawn as the mucosa was fully examined. Estimated blood loss is zero unless otherwise noted in this procedure report. the 2nd duodenum and duodenal bulb were normal.There was no signs of active or recent bleeding.The scope was withdrawn back into the stomach. The stomach was examined in the forward and retro flex fuel and was normal. The distal and proximal esophagus seem well upon withdrawal was normal.    The scope was then withdrawn from the patient and the procedure completed.  COMPLICATIONS: There were no immediate complications.  ENDOSCOPIC IMPRESSION: 1. Anemia/themepositive stool . No obvious findings on endoscopy  RECOMMENDATIONS: will follow the patient clinically. Due to her severe heart disease am not sure that shewould stand a colonoscopy. If her stools are positive we may consider virtual colonoscopy or barium enema.   _______________________________ eSignedLaurence Spates, MD 04/18/2015 2:03 PM     cc:  CPT CODES: ICD CODES:  The ICD and CPT codes recommended by this software are interpretations from the data that the clinical staff has captured with the software.  The verification of the translation of this report to the ICD and CPT codes and  modifiers is the sole responsibility of the health care institution and practicing physician where this report was generated.  [employer] will not be held responsible for the validity of the ICD and CPT codes included on this report.  AMA assumes no liability for data contained or not contained herein. CPT is a Designer, television/film set of the [employer].

## 2015-04-03 NOTE — Progress Notes (Signed)
Rectal exam shows brown stool will send for guaiac Don't think she would stand colon prep will check stools.

## 2015-04-03 NOTE — H&P (View-Only) (Signed)
[name] Terry 12:18 PM  Subjective: Patient seen and examined and discussed with primary team and known to me from a December admission with atypical chest pain and her hospital computer chart was reviewed and she has tended to have dark stools but occasionally will see some bright red blood and his never had a colonoscopy and she did have an endoscopy last year in [place] but has been on multiple blood thinners and morphine helps her chronic pain and she has had multiple hospital admissions and multiple facilities over the last year but supposedly her family history from a GI standpoint is negative  Objective: Vital signs stable afebrile no acute distress lungs are clear heart decreased heart sounds abdomen is soft nontender good bowel sounds labs reviewed very slight hemoglobin decreased BUN and creatinine essentially unchanged abdominal CT multiple times okay  Assessment: Multiple medical problems in a patient with guaiac positive anemia on multiple blood thinners  Plan: She will need cardiology to assess her minimal blood thinner needs and in the meantime my partner Dr. Oletta Lamas will proceed with a colonoscopy and endoscopy tomorrow which was discussed with the patient and she agrees with further workup and plans pending those findings  [facility] E  Pager [phone] After 5PM or if no answer call [phone]

## 2015-04-03 NOTE — Transfer of Care (Signed)
Immediate Anesthesia Transfer of Care Note  Patient: [name] Terry  Procedure(s) Performed: Procedure(s): ESOPHAGOGASTRODUODENOSCOPY (EGD) WITH PROPOFOL (N/A)  Patient Location: PACU  Anesthesia Type:MAC  Level of Consciousness: awake, alert  and oriented  Airway & Oxygen Therapy: Patient Spontanous Breathing and Patient connected to nasal cannula oxygen  Post-op Assessment: Report given to RN and Post -op Vital signs reviewed and stable  Post vital signs: Reviewed and stable  Last Vitals:  Filed Vitals:   04/03/15 0515 04/03/15 1221  BP: 119/68 150/98  Pulse: 67 74  Temp: 36.9 C 36.9 C  Resp: 18 17    Complications: No apparent anesthesia complications

## 2015-04-03 NOTE — Anesthesia Preprocedure Evaluation (Addendum)
Anesthesia Evaluation  Patient identified by MRN, date of birth, ID band Patient awake    Reviewed: Allergy & Precautions, H&P , Patient's Chart, lab work & pertinent test results, reviewed documented beta blocker date and time   Airway Mallampati: II  TM Distance: >3 FB Neck ROM: full    Dental no notable dental hx.    Pulmonary former smoker,    Pulmonary exam normal breath sounds clear to auscultation       Cardiovascular hypertension, + angina with exertion + CAD, + Past MI, + Cardiac Stents and +CHF  + Cardiac Defibrillator  Rhythm:regular Rate:Normal     Neuro/Psych    GI/Hepatic   Endo/Other  diabetes  Renal/GU      Musculoskeletal   Abdominal   Peds  Hematology   Anesthesia Other Findings Ischemic cardiomyopathy...... s/p MDT dual chamber ICD implanted 2013 by Dr Westley Gambles at [facility], now followed by Dr Rayann Heman.  LVEF 30-35% by echo 01/2015 at [facility] per report; 35-40% by echo 01/2015 at [facility].  History of stroke     Coronary artery disease  04/2013 with non-obstructive disease. C.  STEMI 01/2015 @ [facility] - cardiac cath had shown 100% stenosis of prox PDA, patent stent in RCA with otherwise normal coronary arteries.  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 CL 300 msec requiring ICD shocks  recurrent VT 04/2014, placed on amiodarone c. recurrent VT 05/2014   DM2 (diabetes mellitus, type 2),  newly diagnosed    CKD (chronic kidney disease) stage 3, GFR 30-59 ml/min    Polymyalgia rheumatica (HCC)   Depression    GERD (gastroesophageal reflux disease)     Mitral regurgitation  a. mod by echo 01/2015.[facility] Kitchen  PAF (paroxysmal atrial fibrillation) ([place]         Reproductive/Obstetrics                        Anesthesia Physical Anesthesia Plan  ASA:  III  Anesthesia Plan: MAC   Post-op Pain Management:    Induction: Intravenous  Airway Management Planned: Mask and Natural Airway  Additional Equipment:   Intra-op Plan:   Post-operative Plan:   Informed Consent: I have reviewed the patients History and Physical, chart, labs and discussed the procedure including the risks, benefits and alternatives for the proposed anesthesia with the patient or authorized representative who has indicated his/her understanding and acceptance.   Dental Advisory Given  Plan Discussed with: CRNA and Surgeon  Anesthesia Plan Comments: (Discussed sedation and potential to need to place airway or ETT if warranted by clinical changes intra-operatively. We will start procedure as MAC.)        Anesthesia Quick Evaluation

## 2015-04-03 NOTE — Interval H&P Note (Signed)
History and Physical Interval Note:  04/03/2015 12:42 PM  [name] Terry  has presented today for surgery, with the diagnosis of GI Bleed  The various methods of treatment have been discussed with the patient and family. After consideration of risks, benefits and other options for treatment, the patient has consented to  Procedure(s): ESOPHAGOGASTRODUODENOSCOPY (EGD) WITH PROPOFOL (N/A) as a surgical intervention .  The patient's history has been reviewed, patient examined, no change in status, stable for surgery.  I have reviewed the patient's chart and labs.  Questions were answered to the patient's satisfaction.     Aryka Coonradt JR,Larina Lieurance L

## 2015-04-03 NOTE — Progress Notes (Addendum)
Patient only drink 1 cup of the bowel prep since my shift was started. Tried to explain to the patient the importance of finishing the bowel prep and she stated she will try. Will continue to monitor the patient.

## 2015-04-03 NOTE — Anesthesia Postprocedure Evaluation (Signed)
Anesthesia Post Note  Patient: [name] Terry  Procedure(s) Performed: Procedure(s) (LRB): ESOPHAGOGASTRODUODENOSCOPY (EGD) WITH PROPOFOL (N/A)  Patient location during evaluation: PACU Anesthesia Type: MAC Level of consciousness: awake and alert Pain management: pain level controlled Vital Signs Assessment: post-procedure vital signs reviewed and stable Respiratory status: spontaneous breathing, nonlabored ventilation, respiratory function stable and patient connected to nasal cannula oxygen Cardiovascular status: stable and blood pressure returned to baseline Anesthetic complications: no    Last Vitals:  Filed Vitals:   04/03/15 1353 04/03/15 1443  BP: 144/85 125/78  Pulse:    Temp:  36.8 C  Resp: 19 16    Last Pain:  Filed Vitals:   04/03/15 1443  PainSc: 5                  Ia Leeb EDWARD

## 2015-04-03 NOTE — Progress Notes (Signed)
[facility] PROGRESS NOTE  [name] Terry [id number] DOB: [birth year] DOA: 03/30/2015 PCP: Pcp Not In System  Summary 03/31/15: Multiple medical issues- dvt/pe afib, anticoagulated, gi bleed recent, coronary stents. Came in with melena. Had extensive discussion with patient and daughter at bedside. Patient does not seem to have good grasp of the complexity of her condition. Recommended IVC filter, and tos top anticoagulation, have gi perform another endoscopy. Will discontinue Eliquis/Plavix, continue ASA in view of CAD with coronary stents which are more than a year old. Will request IR guided IVC filter tomorrow once patient sufficiently off Eliquis and ask GI to see-?Can source of bleeding be arrested for patient to resume anticoagulation safely in future. Will need prbc transfusion if Hb drops below 8g/dl in view of cardiac issues. Unfortunately, no risk free choices for her and I have explained this to her and her daughter. 04/01/15: Appreciate IR. Discussed with interventional radiology regarding IVC filter placement. Please refer to IR notes for rational decision. Consulted GI. Dr. Watt Climes will see patient in consult in a.m. CT abdomen pelvis unrevealing. Patient complains of abdominal pain but she has not had bowel movement overnight. Will increase dose of aspirin to 325 mg daily for now, and advance diet to Consistent. 04/02/15: Appreciate GI. Hemoglobin has dropped to 7.7 g/dl. We'll therefore transfuse 2 units PRBC and give Lasix in between units. Noted that patient's TSH was 17 last month. We'll therefore increase dose of Synthroid from 50 g 200 g daily. Patient to have EGD/colonoscopy in a.m. per GI. 04/03/15: Appreciate Gi. Patient feels better. Still has melena. Reviewed EGD results. Patient responded to prbc transfusion appropriately. Hopefully can get to bottom of source of gi bleeding Plan GI bleed  Continue protonix  Follow GI recommendations DVT  No IVC filter for now  Continue  aspirin 325 mg daily  Keep holding Plavix/Eliquis Chronic systolic CHF (congestive heart failure) (HCC)/HTN (hypertension)/HLD (hyperlipidemia)/Cardiomyopathy, ischemic-EF 35-40%/CKD (chronic kidney disease) stage 3, GFR 30-59 ml/min/Diabetes mellitus type 2 in obese (HCC)/GERD (gastroesophageal reflux disease)/Anxiety state/Hypothyroidism  Continue Synthroid at 100 g daily  Code Status: Full Code Family Communication: Daughter  Disposition Plan: ?Eventually home  HPI/Subjective: Says abdominal pain better. Still has melena.  Objective: Filed Vitals:   04/03/15 1443 04/03/15 2058  BP: 125/78 136/89  Pulse:  74  Temp: 98.2 F (36.8 C) 98.7 F (37.1 C)  Resp: 16 18    Intake/Output Summary (Last 24 hours) at 04/03/15 2318 Last data filed at 04/03/15 2125  Gross per 24 hour  Intake    930 ml  Output   2050 ml  Net  -1120 ml   Filed Weights   03/30/15 2337 04/02/15 0551 04/03/15 0515  Weight: 85.9 kg (189 lb 6 oz) 87.1 kg (192 lb 0.3 oz) 86.7 kg (191 lb 2.2 oz)    Exam:   General:  Comfortable at rest.  Cardiovascular: S1-S2 normal. No murmurs. Pulse regular.  Respiratory: Good air entry bilaterally. No rhonchi or rales.  Abdomen: Soft and nontender. Normal bowel sounds. No organomegaly.  Musculoskeletal: No pedal edema   Neurological: Intact  Data Reviewed: Basic Metabolic Panel:  Recent Labs Lab 03/30/15 2015 03/31/15 0620 04/01/15 0607 04/02/15 0553 04/03/15 0525  NA 143 144 141 140 141  K 3.1* 3.0* 3.4* 3.4* 3.6  CL 103 107 106 108 107  CO2 28 27 24 25 24   GLUCOSE 112* 97 81 101* 82  BUN 33* 32* 34* 33* 31*  CREATININE 2.34* 2.38* 2.23* 2.34* 2.41*  CALCIUM  9.0 8.7* 8.4* 8.6* 8.5*  MG  --   --  1.9 1.9  --   PHOS  --   --  3.4 3.2  --    Liver Function Tests:  Recent Labs Lab 03/30/15 2015 04/01/15 0607 04/02/15 0553 04/03/15 0525  AST 71* 63* 61* 74*  ALT 54 47 49 59*  ALKPHOS 68 56 54 60  BILITOT 1.0 1.0 0.7 1.9*  PROT 7.6 6.3*  6.1* 6.2*  ALBUMIN 3.6 3.0* 2.8* 2.9*    Recent Labs Lab 03/30/15 2015  LIPASE 61*   No results for input(s): AMMONIA in the last 168 hours. CBC:  Recent Labs Lab 03/30/15 2015 03/31/15 0035 03/31/15 0620 04/01/15 0607 04/02/15 0553 04/03/15 0525  WBC 5.9  --  4.0 4.6 4.9 5.1  NEUTROABS 3.9  --   --  3.1  --   --   HGB 9.5* 9.7* 8.6* 8.2* 7.7* 10.2*  HCT 27.2* 29.4* 23.7* 25.3* 23.0* 31.4*  MCV 99.3  --  103.5* 100.8* 102.2* 95.7  PLT 235  --  197 195 196 181   Cardiac Enzymes:  Recent Labs Lab 03/31/15 0035 03/31/15 0620  TROPONINI 0.05* 0.04*   BNP (last 3 results)  Recent Labs  02/20/15 1645 02/21/15 1042 03/01/15 0839  BNP 463.9* 386.1* 452.0*    ProBNP (last 3 results) No results for input(s): PROBNP in the last 8760 hours.  CBG:  Recent Labs Lab 04/02/15 1214 04/02/15 1640 04/02/15 2033 04/03/15 0801 04/03/15 2057  GLUCAP 161* 130* 89 82 137*    No results found for this or any previous visit (from the past 240 hour(s)).   Studies: No results found.  Scheduled Meds: . sodium chloride   Intravenous Once  . amiodarone  200 mg Oral BID  . aspirin EC  325 mg Oral Daily  . atorvastatin  40 mg Oral q1800  . carvedilol  9.375 mg Oral BID WC  . insulin aspart  0-9 Units Subcutaneous TID WC  . isosorbide mononitrate  60 mg Oral Daily  . levothyroxine  100 mcg Oral QAC breakfast  . multivitamin with minerals  1 tablet Oral Daily  . pantoprazole (PROTONIX) IV  40 mg Intravenous Q12H  . PARoxetine  10 mg Oral Daily  . potassium chloride SA  20 mEq Oral Daily  . sodium chloride  3 mL Intravenous Q12H  . sucralfate  1 g Oral TID WC & HS  . torsemide  20 mg Oral Daily   Continuous Infusions: . sodium chloride       Time spent: 25 minutes    Cade Olberding  [facility] Pager [phone]. If 7PM-7AM, please contact night-coverage at [email or web address], password [facility] 04/03/2015, 11:18 PM  LOS: 4 days

## 2015-04-04 ENCOUNTER — Encounter (HOSPITAL_COMMUNITY): Payer: Self-pay | Admitting: Gastroenterology

## 2015-04-04 LAB — BASIC METABOLIC PANEL
ANION GAP: 10 (ref 5–15)
BUN: 28 mg/dL — ABNORMAL HIGH (ref 6–20)
CHLORIDE: 105 mmol/L (ref 101–111)
CO2: 25 mmol/L (ref 22–32)
Calcium: 8.5 mg/dL — ABNORMAL LOW (ref 8.9–10.3)
Creatinine, Ser: 2.34 mg/dL — ABNORMAL HIGH (ref 0.44–1.00)
GFR calc non Af Amer: 21 mL/min — ABNORMAL LOW (ref >60)
GFR, EST AFRICAN AMERICAN: 24 mL/min — AB (ref >60)
GLUCOSE: 89 mg/dL (ref 65–99)
Potassium: 3.3 mmol/L — ABNORMAL LOW (ref 3.5–5.1)
Sodium: 140 mmol/L (ref 135–145)

## 2015-04-04 LAB — GLUCOSE, CAPILLARY
GLUCOSE-CAPILLARY: 116 mg/dL — AB (ref 65–99)
GLUCOSE-CAPILLARY: 88 mg/dL (ref 65–99)
GLUCOSE-CAPILLARY: 139 mg/dL — AB (ref 65–99)
GLUCOSE-CAPILLARY: 102 mg/dL — AB (ref 65–99)
GLUCOSE-CAPILLARY: 132 mg/dL — AB (ref 65–99)
Glucose-Capillary: 81 mg/dL (ref 65–99)

## 2015-04-04 LAB — CBC
HEMATOCRIT: 30.8 % — AB (ref 36.0–46.0)
HEMOGLOBIN: 10.1 g/dL — AB (ref 12.0–15.0)
MCH: 31.2 pg (ref 26.0–34.0)
MCHC: 32.8 g/dL (ref 30.0–36.0)
MCV: 95.1 fL (ref 78.0–100.0)
Platelets: 180 10*3/uL (ref 150–400)
RBC: 3.24 MIL/uL — ABNORMAL LOW (ref 3.87–5.11)
RDW: 21.1 % — ABNORMAL HIGH (ref 11.5–15.5)
WBC: 5 10*3/uL (ref 4.0–10.5)

## 2015-04-04 LAB — OCCULT BLOOD X 1 CARD TO LAB, STOOL: Fecal Occult Bld: POSITIVE — AB

## 2015-04-04 MED ORDER — POTASSIUM CHLORIDE 20 MEQ/15ML (10%) PO SOLN
40.0000 meq | Freq: Once | ORAL | Status: AC
Start: 2015-04-04 — End: 2015-04-04
  Administered 2015-04-04: 40 meq via ORAL
  Filled 2015-04-04: qty 30

## 2015-04-04 NOTE — Care Management Important Message (Signed)
Important Message  Patient Details  Name: KIMYRA PRESSNALL MRN: [id number] Date of Birth: [birth year]   Medicare Important Message Given:  Yes    Camillo Flaming 04/04/2015, 11:11 AMImportant Message  Patient Details  Name: NAYMA FAVA MRN: [id number] Date of Birth: May 04, 1950   Medicare Important Message Given:  Yes    Camillo Flaming 04/04/2015, 11:11 AM

## 2015-04-04 NOTE — Progress Notes (Signed)
[facility] PROGRESS NOTE  Subjective Pt states she is having the same atypical chest pain she had on admission EGD yesterday did not show any abnormality. She did have been positive stool. She has significant cardiac disease with need for chronic anticoagulation. She has CAD, PAF, CKD, chronic ischemic cardiomyopathy with congestive heart failure with the EF 35%. She has never had a colonoscopy.  Objective: Vital signs in last 24 hours: Temp:  [98 F (36.7 C)-99.2 F (37.3 C)] 99.2 F (37.3 C) (01/10 0527) Pulse Rate:  [67-74] 67 (01/10 0527) Resp:  [16-23] 18 (01/10 0527) BP: (125-144)/(68-89) 130/68 mmHg (01/10 0527) SpO2:  [96 %-100 %] 100 % (01/10 0527) Weight:  [87 kg (191 lb 12.8 oz)] 87 kg (191 lb 12.8 oz) (01/10 0630) Last BM Date: 03/30/15  Intake/Output from previous day: 01/09 0701 - 01/10 0700 In: 62 [P.O.:360; I.V.:300] Out: 2300 [Urine:2300] Intake/Output this shift: Total I/O In: -  Out: 300 [Urine:300]  PE: General-- patient tearful says she hurts in her chest and stomach all over Heart-- regular rate and rhythm without murmurs or gallops Lungs-- clear Abdomen-- soft and nontender.  Lab Results:  Recent Labs  04/02/15 0553 04/03/15 0525 04/04/15 0550  WBC 4.9 5.1 5.0  HGB 7.7* 10.2* 10.1*  HCT 23.0* 31.4* 30.8*  PLT 196 181 180   BMET  Recent Labs  04/02/15 0553 04/03/15 0525 04/04/15 0550  NA 140 141 140  K 3.4* 3.6 3.3*  CL 108 107 105  CO2 25 24 25   CREATININE 2.34* 2.41* 2.34*   LFT  Recent Labs  04/02/15 0553 04/03/15 0525  PROT 6.1* 6.2*  AST 61* 74*  ALT 49 59*  ALKPHOS 54 60  BILITOT 0.7 1.9*   PT/INR  Recent Labs  04/02/15 0553 04/03/15 0525  LABPROT 19.6* 17.6*  INR 1.66* 1.44   PANCREAS No results for input(s): LIPASE in the last 72 hours.       Studies/Results: No results found.  Medications: I have reviewed the patient's current medications.  Assessment/Plan: 1. Heme positive  stool/anemia. EGD negative. Patient has been on anticoagulation due to significant severe heart disease. She continues to have pain that defies explanation. Am concerned about colonoscopy prep since I'm not really sure this would add much of anything and would place her at risk for complication. 2. PAF/CAD/CHF. Requires chronic anticoagulation. Is being evaluated for IVC filter. Has a low ejection fraction.  At this point, would consider barium enema to rule out gross lesion of the colon. Would go ahead and get this done when she is stable from her other problems.   Earnstine Meinders JR,Amahd Morino L 04/04/2015, 12:36 PM  Pager: [phone] If no answer or after hours call [phone]

## 2015-04-04 NOTE — Progress Notes (Signed)
[facility] PROGRESS NOTE  [name] Terry [id number] DOB: September 10, 1950 DOA: 03/30/2015 PCP: Pcp Not In System  Summary 03/31/15: Multiple medical issues- dvt/pe afib, anticoagulated, gi bleed recent, coronary stents. Came in with melena. Had extensive discussion with patient and daughter at bedside. Patient does not seem to have good grasp of the complexity of her condition. Recommended IVC filter, and tos top anticoagulation, have gi perform another endoscopy. Will discontinue Eliquis/Plavix, continue ASA in view of CAD with coronary stents which are more than a year old. Will request IR guided IVC filter tomorrow once patient sufficiently off Eliquis and ask GI to see-?Can source of bleeding be arrested for patient to resume anticoagulation safely in future. Will need prbc transfusion if Hb drops below 8g/dl in view of cardiac issues. Unfortunately, no risk free choices for her and I have explained this to her and her daughter. 04/01/15: Appreciate IR. Discussed with interventional radiology regarding IVC filter placement. Please refer to IR notes for rational decision. Consulted GI. Dr. Watt Climes will see patient in consult in a.m. CT abdomen pelvis unrevealing. Patient complains of abdominal pain but she has not had bowel movement overnight. Will increase dose of aspirin to 325 mg daily for now, and advance diet to Consistent. 04/02/15: Appreciate GI. Hemoglobin has dropped to 7.7 g/dl. We'll therefore transfuse 2 units PRBC and give Lasix in between units. Noted that patient's TSH was 17 last month. We'll therefore increase dose of Synthroid from 50 g 200 g daily. Patient to have EGD/colonoscopy in a.m. per GI. 04/03/15: Appreciate Gi. Patient feels better. Still has melena. Reviewed EGD results. Patient responded to prbc transfusion appropriately. Hopefully can get to bottom of source of gi bleeding. 04/04/15: Appreciate GI. Patient says abdominal pain is better. Question still remains as to the source of  GI bleeding. If this can be arrested, patient can resume anticoagulation. Interventional radiology has recommended against placing IVC filter at the moment but this can be revisited if bleeding not addressed. Will follow GI recommendations regarding further management of GI bleeding. Meanwhile, continue supportive management. Plan GI bleed  Continue protonix  Follow GI recommendations DVT  No IVC filter for now  Continue aspirin 325 mg daily  Keep holding Plavix/Eliquis Chronic systolic CHF (congestive heart failure) (HCC)/HTN (hypertension)/HLD (hyperlipidemia)/Cardiomyopathy, ischemic-EF 35-40%/CKD (chronic kidney disease) stage 3, GFR 30-59 ml/min/Diabetes mellitus type 2 in obese (HCC)/GERD (gastroesophageal reflux disease)/Anxiety state/Hypothyroidism  Continue Synthroid at 100 g daily  Code Status: Full Code Family Communication: Daughter on 04/03/15 Disposition Plan: ?Eventually home  Consultants:  GI  Procedures:  EGD on 04/03/15  Antibiotics:  None  HPI/Subjective: Feels okay. No complaints.  Objective: Filed Vitals:   04/04/15 0527 04/04/15 1349  BP: 130/68 128/72  Pulse: 67 74  Temp: 99.2 F (37.3 C) 98.5 F (36.9 C)  Resp: 18 20    Intake/Output Summary (Last 24 hours) at 04/04/15 2117 Last data filed at 04/04/15 [id number]  Gross per 24 hour  Intake    120 ml  Output   1150 ml  Net  -1030 ml   Filed Weights   04/02/15 0551 04/03/15 0515 04/04/15 0630  Weight: 87.1 kg (192 lb 0.3 oz) 86.7 kg (191 lb 2.2 oz) 87 kg (191 lb 12.8 oz)    Exam:   General:  Comfortable at rest.  Cardiovascular: S1-S2 normal. No murmurs. Pulse regular.  Respiratory: Good air entry bilaterally. No rhonchi or rales.  Abdomen: Soft and nontender. Normal bowel sounds. No organomegaly.  Musculoskeletal: No pedal edema  Neurological: Intact  Data Reviewed: Basic Metabolic Panel:  Recent Labs Lab 03/31/15 0620 04/01/15 0607 04/02/15 0553 04/03/15 0525  04/04/15 0550  NA 144 141 140 141 140  K 3.0* 3.4* 3.4* 3.6 3.3*  CL 107 106 108 107 105  CO2 27 24 25 24 25   GLUCOSE 97 81 101* 82 89  BUN 32* 34* 33* 31* 28*  CREATININE 2.38* 2.23* 2.34* 2.41* 2.34*  CALCIUM 8.7* 8.4* 8.6* 8.5* 8.5*  MG  --  1.9 1.9  --   --   PHOS  --  3.4 3.2  --   --    Liver Function Tests:  Recent Labs Lab 03/30/15 2015 04/01/15 0607 04/02/15 0553 04/03/15 0525  AST 71* 63* 61* 74*  ALT 54 47 49 59*  ALKPHOS 68 56 54 60  BILITOT 1.0 1.0 0.7 1.9*  PROT 7.6 6.3* 6.1* 6.2*  ALBUMIN 3.6 3.0* 2.8* 2.9*    Recent Labs Lab 03/30/15 2015  LIPASE 61*   No results for input(s): AMMONIA in the last 168 hours. CBC:  Recent Labs Lab 03/30/15 2015  03/31/15 0620 04/01/15 0607 04/02/15 0553 04/03/15 0525 04/04/15 0550  WBC 5.9  --  4.0 4.6 4.9 5.1 5.0  NEUTROABS 3.9  --   --  3.1  --   --   --   HGB 9.5*  < > 8.6* 8.2* 7.7* 10.2* 10.1*  HCT 27.2*  < > 23.7* 25.3* 23.0* 31.4* 30.8*  MCV 99.3  --  103.5* 100.8* 102.2* 95.7 95.1  PLT 235  --  197 195 196 181 180  < > = values in this interval not displayed. Cardiac Enzymes:  Recent Labs Lab 03/31/15 0035 03/31/15 0620  TROPONINI 0.05* 0.04*   BNP (last 3 results)  Recent Labs  02/20/15 1645 02/21/15 1042 03/01/15 0839  BNP 463.9* 386.1* 452.0*    ProBNP (last 3 results) No results for input(s): PROBNP in the last 8760 hours.  CBG:  Recent Labs Lab 04/03/15 1713 04/03/15 2057 04/04/15 0822 04/04/15 1211 04/04/15 1616  GLUCAP 116* 137* 88 139* 102*    No results found for this or any previous visit (from the past 240 hour(s)).   Studies: No results found.  Scheduled Meds: . sodium chloride   Intravenous Once  . amiodarone  200 mg Oral BID  . aspirin EC  325 mg Oral Daily  . atorvastatin  40 mg Oral q1800  . carvedilol  9.375 mg Oral BID WC  . insulin aspart  0-9 Units Subcutaneous TID WC  . isosorbide mononitrate  60 mg Oral Daily  . levothyroxine  100 mcg Oral QAC  breakfast  . multivitamin with minerals  1 tablet Oral Daily  . pantoprazole (PROTONIX) IV  40 mg Intravenous Q12H  . PARoxetine  10 mg Oral Daily  . potassium chloride SA  20 mEq Oral Daily  . sodium chloride  3 mL Intravenous Q12H  . sucralfate  1 g Oral TID WC & HS  . torsemide  20 mg Oral Daily   Continuous Infusions: . sodium chloride       Time spent: 15 minutes    Natash Berman  [facility] Pager [phone]. If 7PM-7AM, please contact night-coverage at [email or web address], password [facility] 04/04/2015, 9:17 PM  LOS: 5 days

## 2015-04-05 DIAGNOSIS — I10 Essential (primary) hypertension: Secondary | ICD-10-CM

## 2015-04-05 DIAGNOSIS — K922 Gastrointestinal hemorrhage, unspecified: Secondary | ICD-10-CM

## 2015-04-05 DIAGNOSIS — E785 Hyperlipidemia, unspecified: Secondary | ICD-10-CM

## 2015-04-05 DIAGNOSIS — N183 Chronic kidney disease, stage 3 (moderate): Secondary | ICD-10-CM

## 2015-04-05 DIAGNOSIS — I255 Ischemic cardiomyopathy: Secondary | ICD-10-CM

## 2015-04-05 DIAGNOSIS — E119 Type 2 diabetes mellitus without complications: Secondary | ICD-10-CM

## 2015-04-05 DIAGNOSIS — E669 Obesity, unspecified: Secondary | ICD-10-CM

## 2015-04-05 LAB — CBC
HCT: 30 % — ABNORMAL LOW (ref 36.0–46.0)
HEMOGLOBIN: 10 g/dL — AB (ref 12.0–15.0)
MCH: 32.4 pg (ref 26.0–34.0)
MCHC: 33.3 g/dL (ref 30.0–36.0)
MCV: 97.1 fL (ref 78.0–100.0)
Platelets: 192 10*3/uL (ref 150–400)
RBC: 3.09 MIL/uL — AB (ref 3.87–5.11)
RDW: 21.3 % — ABNORMAL HIGH (ref 11.5–15.5)
WBC: 4.4 10*3/uL (ref 4.0–10.5)

## 2015-04-05 LAB — BASIC METABOLIC PANEL
Anion gap: 7 (ref 5–15)
BUN: 29 mg/dL — AB (ref 6–20)
CHLORIDE: 105 mmol/L (ref 101–111)
CO2: 26 mmol/L (ref 22–32)
Calcium: 8.7 mg/dL — ABNORMAL LOW (ref 8.9–10.3)
Creatinine, Ser: 2.7 mg/dL — ABNORMAL HIGH (ref 0.44–1.00)
GFR calc Af Amer: 20 mL/min — ABNORMAL LOW (ref >60)
GFR calc non Af Amer: 18 mL/min — ABNORMAL LOW (ref >60)
GLUCOSE: 108 mg/dL — AB (ref 65–99)
POTASSIUM: 4 mmol/L (ref 3.5–5.1)
SODIUM: 138 mmol/L (ref 135–145)

## 2015-04-05 LAB — GLUCOSE, CAPILLARY
GLUCOSE-CAPILLARY: 99 mg/dL (ref 65–99)
Glucose-Capillary: 124 mg/dL — ABNORMAL HIGH (ref 65–99)
Glucose-Capillary: 100 mg/dL — ABNORMAL HIGH (ref 65–99)

## 2015-04-05 MED ORDER — TRAMADOL HCL 50 MG PO TABS
50.0000 mg | ORAL_TABLET | Freq: Once | ORAL | Status: AC
  Administered 2015-04-05: 50 mg via ORAL
  Filled 2015-04-05: qty 1

## 2015-04-05 MED ORDER — PEG 3350-KCL-NA BICARB-NACL 420 G PO SOLR
4000.0000 mL | Freq: Once | ORAL | Status: AC
  Administered 2015-04-05: 4000 mL via ORAL

## 2015-04-05 NOTE — Progress Notes (Signed)
ICM transmission received on 03/30/2015 but patient is currently hospitalized. She has an office appointment with Dr Rayann Heman on 04/14/2015. ICM transmission rescheduled for 04/20/2015.

## 2015-04-05 NOTE — Progress Notes (Signed)
[facility] PROGRESS NOTE Subjective patient feels better possibly due to the transfusion. She states that her stools are still dark. She has remained all liquids. She had EGD without clear findings. She's having no further chest pain or other problems. His been a number of years since she had a colonoscopy.  Objective: Vital signs in last 24 hours: Temp:  [98.2 F (36.8 C)-98.4 F (36.9 C)] 98.4 F (36.9 C) (01/11 1445) Pulse Rate:  [61-68] 61 (01/11 1445) Resp:  [18] 18 (01/11 1445) BP: (100-111)/(62-68) 100/63 mmHg (01/11 1445) SpO2:  [96 %-100 %] 96 % (01/11 1445) Weight:  [88.6 kg (195 lb 5.2 oz)] 88.6 kg (195 lb 5.2 oz) (01/11 0540) Last BM Date: 04/04/15  Intake/Output from previous day: 01/10 0701 - 01/11 0700 In: 240 [P.O.:240] Out: 300 [Urine:300] Intake/Output this shift: Total I/O In: 360 [P.O.:360] Out: -   PE:  General-- no acute distress alert and oriented Abdomen-- soft and nontender  Lab Results:  Recent Labs  04/03/15 0525 04/04/15 0550 04/05/15 0554  WBC 5.1 5.0 4.4  HGB 10.2* 10.1* 10.0*  HCT 31.4* 30.8* 30.0*  PLT 181 180 192   BMET  Recent Labs  04/03/15 0525 04/04/15 0550 04/05/15 0554  NA 141 140 138  K 3.6 3.3* 4.0  CL 107 105 105  CO2 24 25 26   CREATININE 2.41* 2.34* 2.70*   LFT  Recent Labs  04/03/15 0525  PROT 6.2*  AST 74*  ALT 59*  ALKPHOS 60  BILITOT 1.9*   PT/INR  Recent Labs  04/03/15 0525  LABPROT 17.6*  INR 1.44   PANCREAS No results for input(s): LIPASE in the last 72 hours.       Studies/Results: No results found.  Medications: I have reviewed the patient's current medications.  Assessment/Plan: 1. G.I. bleed/anemia. Stools been intermittently positive. Hemoglobin is stable. EGD was negative. I think she does clearly needed: evaluation and she seems to be doing much better at this time. Long discussion with her about colonoscopy versus barium enema. Her daughter and granddaughter  in the room with her. He also seem to feel that she could tolerate a colonoscopy prep. We therefore will attempt colonoscopy tomorrow after a split dose prep using the ultra-thin colonoscope.   Beautifull Cisar JR,Lamark Schue L 04/05/2015, 4:05 PM  Pager: [phone] If no answer or after hours call [phone]

## 2015-04-05 NOTE — Progress Notes (Addendum)
PROGRESS NOTE  [name] Terry [id number] DOB: 1950/10/02 DOA: 03/30/2015 PCP: Pcp Not In System   HPI: 65 year old female with a past medical history of ischemic cardiomyopathy, chronic systolic congestive heart failure last EF 35-40% in 01/2015, CKD, DM2, CAD, PAF on Eliquis, STEMI in November 2016-treated medically at [facility] with a cardiac catheterization showing 100% stenosis of the proximal PDA, admitted on 1/6 with complaints of black stools.  Subjective / 24 H Interval events - ongoing abdominal pain, relieved by pain medications   Assessment/Plan: Principal Problem:   GI bleed Active Problems:   CHF (congestive heart failure) (HCC)   HTN (hypertension)   HLD (hyperlipidemia)   Cardiomyopathy, ischemic-EF 35-40%   Diabetes mellitus type 2 in obese (HCC)   GERD (gastroesophageal reflux disease)   Chronic kidney disease (CKD), stage IV (severe) (HCC)   Anxiety state   Hypothyroidism   GI bleeding - appreciate GI consultation, patient underwent an EGD on 1/9 without acute findings - GI plans for barium enema to evaluate gross lesions of the colon   CAD S/P prior RCA PCI, cath 05/03/2013- med Rx, STEMI 01/2015 - 100% prox PDA, treated medically - Continue aspirin, hold Plavix given GI Bleed. Stents > 1 year ago - currently no chest pain  PAF - CHADSVASC score 4, hold anticoagulation due to GI bleeding  Ischemic cardiomyopathy and chronic systolic congestive heart failure - appears euvolemic  HTN (hypertension): Stable - continue coreg, lasix, toresemide, hydralazine, isosorbide mononitrate.  Cough with hemoptysis: Although patient reports continued hemoptysis H&H remains stable. - continue to monitor  Acute blood loss anemia - s/p 2U pRBC on 1/8, Hb stable since. Clinically it appears that bleeding has stopped  - check CBC in a.m.   Acute deep vein thrombosis involving a small segment of the right mid peroneal vein - patient states that  she was recently diagnosed with LLE DVT, however study here is negative - off of anticoagulation for #1 - Dr. Sanjuana Letters discussed with IR regarding IVC filter placement, IR evaluated case on 1/7 and recommended holding off IVC filter for small DVT, repeat doppler in 1 week to evaluate propagation  Diabetes mellitus type 2 in obese ([place]) - SSI  Hypothyroidism - Continue levothyroxine  Chronic kidney disease stage IV - overall stable. Patient with a creatinine of 2.34 on admission it appears that her baseline creatinine 2.2-2.5 - Continue to monitor  Anxiety - Continue and Paxil and Xanax prn  Chronic Gerd with history of hiatal hernia - Continue Protonix and Carafate  Hyperlipidemia - Continue atorvastatin   Diet:   Fluids: none DVT Prophylaxis: SCD  Code Status: Full Code Family Communication: no family bedside  Disposition Plan: home when ready   Barriers to discharge: GI evaluation, bleeding  Consultants:  GI  Procedures:  EGD   Antibiotics  Anti-infectives    None       Studies  No results found.  Objective  Filed Vitals:   04/04/15 1349 04/05/15 0540 04/05/15 0831 04/05/15 1445  BP: 128/72 101/62 111/68 100/63  Pulse: 74 68 66 61  Temp: 98.5 F (36.9 C) 98.2 F (36.8 C)  98.4 F (36.9 C)  TempSrc: Oral Oral  Oral  Resp: 20 18  18   Height:      Weight:  88.6 kg (195 lb 5.2 oz)    SpO2: 99% 100%  96%    Intake/Output Summary (Last 24 hours) at 04/05/15 1527 Last data filed at 04/05/15 0930  Gross per 24 hour  Intake    600 ml  Output      0 ml  Net    600 ml   Filed Weights   04/03/15 0515 04/04/15 0630 04/05/15 0540  Weight: 86.7 kg (191 lb 2.2 oz) 87 kg (191 lb 12.8 oz) 88.6 kg (195 lb 5.2 oz)    Exam:  GENERAL: NAD  HEENT: no scleral icterus, PERRL  NECK: supple, no LAD  LUNGS: CTA biL, no wheezing  HEART: RRR without MRG  ABDOMEN: soft, non tender  EXTREMITIES: no clubbing / cyanosis\  Data Reviewed: Basic Metabolic  Panel:  Recent Labs Lab 04/01/15 0607 04/02/15 0553 04/03/15 0525 04/04/15 0550 04/05/15 0554  NA 141 140 141 140 138  K 3.4* 3.4* 3.6 3.3* 4.0  CL 106 108 107 105 105  CO2 24 25 24 25 26   GLUCOSE 81 101* 82 89 108*  BUN 34* 33* 31* 28* 29*  CREATININE 2.23* 2.34* 2.41* 2.34* 2.70*  CALCIUM 8.4* 8.6* 8.5* 8.5* 8.7*  MG 1.9 1.9  --   --   --   PHOS 3.4 3.2  --   --   --    Liver Function Tests:  Recent Labs Lab 03/30/15 2015 04/01/15 0607 04/02/15 0553 04/03/15 0525  AST 71* 63* 61* 74*  ALT 54 47 49 59*  ALKPHOS 68 56 54 60  BILITOT 1.0 1.0 0.7 1.9*  PROT 7.6 6.3* 6.1* 6.2*  ALBUMIN 3.6 3.0* 2.8* 2.9*    Recent Labs Lab 03/30/15 2015  LIPASE 61*   CBC:  Recent Labs Lab 03/30/15 2015  04/01/15 0607 04/02/15 0553 04/03/15 0525 04/04/15 0550 04/05/15 0554  WBC 5.9  < > 4.6 4.9 5.1 5.0 4.4  NEUTROABS 3.9  --  3.1  --   --   --   --   HGB 9.5*  < > 8.2* 7.7* 10.2* 10.1* 10.0*  HCT 27.2*  < > 25.3* 23.0* 31.4* 30.8* 30.0*  MCV 99.3  < > 100.8* 102.2* 95.7 95.1 97.1  PLT 235  < > 195 196 181 180 192  < > = values in this interval not displayed. Cardiac Enzymes:  Recent Labs Lab 03/31/15 0035 03/31/15 0620  TROPONINI 0.05* 0.04*   BNP (last 3 results)  Recent Labs  02/20/15 1645 02/21/15 1042 03/01/15 0839  BNP 463.9* 386.1* 452.0*   CBG:  Recent Labs Lab 04/04/15 1211 04/04/15 1616 04/04/15 2140 04/05/15 0816 04/05/15 1212  GLUCAP 139* 102* 132* 99 124*   Scheduled Meds: . sodium chloride   Intravenous Once  . amiodarone  200 mg Oral BID  . aspirin EC  325 mg Oral Daily  . atorvastatin  40 mg Oral q1800  . carvedilol  9.375 mg Oral BID WC  . insulin aspart  0-9 Units Subcutaneous TID WC  . isosorbide mononitrate  60 mg Oral Daily  . levothyroxine  100 mcg Oral QAC breakfast  . multivitamin with minerals  1 tablet Oral Daily  . pantoprazole (PROTONIX) IV  40 mg Intravenous Q12H  . PARoxetine  10 mg Oral Daily  . polyethylene  glycol-electrolytes  4,000 mL Oral Once  . potassium chloride SA  20 mEq Oral Daily  . sodium chloride  3 mL Intravenous Q12H  . torsemide  20 mg Oral Daily   Continuous Infusions: . sodium chloride     Marzetta Board, MD [facility] Pager [phone]. If 7 PM - 7 AM, please contact night-coverage at [email or web address], password [facility] 04/05/2015, 3:27 PM  LOS: 6 days

## 2015-04-06 DIAGNOSIS — N184 Chronic kidney disease, stage 4 (severe): Secondary | ICD-10-CM

## 2015-04-06 LAB — GLUCOSE, CAPILLARY
Glucose-Capillary: 75 mg/dL (ref 65–99)
Glucose-Capillary: 69 mg/dL (ref 65–99)
Glucose-Capillary: 85 mg/dL (ref 65–99)
Glucose-Capillary: 96 mg/dL (ref 65–99)

## 2015-04-06 LAB — CBC
HEMATOCRIT: 28.8 % — AB (ref 36.0–46.0)
HEMOGLOBIN: 9.8 g/dL — AB (ref 12.0–15.0)
MCH: 33 pg (ref 26.0–34.0)
MCHC: 34 g/dL (ref 30.0–36.0)
MCV: 97 fL (ref 78.0–100.0)
Platelets: 201 10*3/uL (ref 150–400)
RBC: 2.97 MIL/uL — ABNORMAL LOW (ref 3.87–5.11)
RDW: 20.7 % — ABNORMAL HIGH (ref 11.5–15.5)
WBC: 3.6 10*3/uL — AB (ref 4.0–10.5)

## 2015-04-06 LAB — BASIC METABOLIC PANEL
ANION GAP: 10 (ref 5–15)
BUN: 27 mg/dL — ABNORMAL HIGH (ref 6–20)
CHLORIDE: 105 mmol/L (ref 101–111)
CO2: 25 mmol/L (ref 22–32)
Calcium: 8.5 mg/dL — ABNORMAL LOW (ref 8.9–10.3)
Creatinine, Ser: 2.5 mg/dL — ABNORMAL HIGH (ref 0.44–1.00)
GFR calc non Af Amer: 19 mL/min — ABNORMAL LOW (ref >60)
GFR, EST AFRICAN AMERICAN: 22 mL/min — AB (ref >60)
GLUCOSE: 82 mg/dL (ref 65–99)
POTASSIUM: 3.4 mmol/L — AB (ref 3.5–5.1)
Sodium: 140 mmol/L (ref 135–145)

## 2015-04-06 MED ORDER — SODIUM CHLORIDE 0.9 % IV SOLN
INTRAVENOUS | Status: DC
  Administered 2015-04-06: 05:00:00 via INTRAVENOUS

## 2015-04-06 MED ORDER — SODIUM CHLORIDE 0.9 % IV SOLN: INTRAVENOUS | Status: DC

## 2015-04-06 MED ORDER — PEG 3350-KCL-NA BICARB-NACL 420 G PO SOLR
4000.0000 mL | Freq: Once | ORAL | Status: AC
  Administered 2015-04-06: 4000 mL via ORAL

## 2015-04-06 NOTE — Progress Notes (Addendum)
Apparently orders for the preparation for colonoscopy as well as for npo timing were not followed in the patient did not take all the prep. Despite order to be NPO after 10 AM she apparently was still drinking liquids. For this reason the colonoscopy under MAC was canceled for today. We will therefore reschedule colonoscopy at 8 AM tomorrow, continue to finish the whole prep and in fact give her another one half-gallon of prep with additional enemas and continue clear liquids only  Pt on BS toilet denies abd pain at present For colon at 8 AM tomorrow. Went over with her again the need to be NPO after MN

## 2015-04-06 NOTE — Progress Notes (Addendum)
PROGRESS NOTE  [name] Terry [id number] DOB: 09-17-1950 DOA: 03/30/2015 PCP: Pcp Not In System   HPI: 65 year old female with a past medical history of ischemic cardiomyopathy, chronic systolic congestive heart failure last EF 35-40% in 01/2015, CKD, DM2, CAD, PAF on Eliquis, STEMI in November 2016-treated medically at [facility] with a cardiac catheterization showing 100% stenosis of the proximal PDA, admitted on 1/6 with complaints of black stools.  Subjective / 24 H Interval events - ongoing abdominal pain, crying when I entered her room   Assessment/Plan: Principal Problem:   GI bleed Active Problems:   CHF (congestive heart failure) (HCC)   HTN (hypertension)   HLD (hyperlipidemia)   Cardiomyopathy, ischemic-EF 35-40%   Diabetes mellitus type 2 in obese (HCC)   GERD (gastroesophageal reflux disease)   Chronic kidney disease (CKD), stage IV (severe) (HCC)   Anxiety state   Hypothyroidism   GI bleeding - appreciate GI consultation, patient underwent an EGD on 1/9 without acute findings - plan for colonoscopy tomorrow   CAD S/P prior RCA PCI, cath 05/03/2013- med Rx, STEMI 01/2015 - 100% prox PDA, treated medically - Continue aspirin, hold Plavix given GI Bleed. Stents > 1 year ago - currently no chest pain  PAF - CHADSVASC score 4, hold anticoagulation due to GI bleeding  Ischemic cardiomyopathy and chronic systolic congestive heart failure - appears euvolemic  HTN (hypertension): Stable - continue coreg, lasix, toresemide, hydralazine, isosorbide mononitrate. - BP 132 / 80, no changes in her regimen  Cough with hemoptysis: Although patient reports continued hemoptysis H&H remains stable. - continue to monitor, no further episodes  Acute blood loss anemia - s/p 2U pRBC on 1/8, Hb stable since. Clinically it appears that bleeding has stopped  - Hb 9.8 this morning, 10 yesterday. Stable   Acute deep vein thrombosis involving a small segment of  the right mid peroneal vein - patient states that she was recently diagnosed with LLE DVT, however study here is negative - off of anticoagulation for #1 - Dr. Sanjuana Letters discussed with IR regarding IVC filter placement, IR evaluated case on 1/7 and recommended holding off IVC filter for small DVT, repeat doppler in 1 week to evaluate propagation - will repeat doppler tomorrow  Diabetes mellitus type 2 in obese ([place]) - SSI  Hypothyroidism - Continue levothyroxine  Chronic kidney disease stage IV - overall stable. Patient with a creatinine of 2.34 on admission it appears that her baseline creatinine 2.2-2.5 - Continue to monitor - Cr 2.5 this morning, slight improvement from 2.7 yesterday  Anxiety - Continue and Paxil and Xanax prn  Chronic Gerd with history of hiatal hernia - Continue Protonix and Carafate  Hyperlipidemia - Continue atorvastatin   Diet: Diet clear liquid Room service appropriate?: Yes; Fluid consistency:: Thin Fluids: none DVT Prophylaxis: SCD  Code Status: Full Code Family Communication: no family bedside  Disposition Plan: home when ready   Barriers to discharge: GI evaluation, bleeding  Consultants:  GI  Procedures:  EGD   Antibiotics  Anti-infectives    None       Studies  No results found.  Objective  Filed Vitals:   04/05/15 0831 04/05/15 1445 04/05/15 2129 04/06/15 0556  BP: 111/68 100/63 109/69 132/80  Pulse: 66 61 66 65  Temp:  98.4 F (36.9 C) 98 F (36.7 C) 98.7 F (37.1 C)  TempSrc:  Oral Oral Oral  Resp:  18 18 18   Height:      Weight:    86.7  kg (191 lb 2.2 oz)  SpO2:  96% 100% 100%   No intake or output data in the 24 hours ending 04/06/15 1318 Filed Weights   04/04/15 0630 04/05/15 0540 04/06/15 0556  Weight: 87 kg (191 lb 12.8 oz) 88.6 kg (195 lb 5.2 oz) 86.7 kg (191 lb 2.2 oz)    Exam:  GENERAL: NAD  HEENT: no scleral icterus, PERRL  NECK: supple, no LAD  LUNGS: CTA biL, no wheezing  HEART: RRR  without MRG  ABDOMEN: soft, non tender  EXTREMITIES: no clubbing / cyanosis\  Data Reviewed: Basic Metabolic Panel:  Recent Labs Lab 04/01/15 0607 04/02/15 0553 04/03/15 0525 04/04/15 0550 04/05/15 0554 04/06/15 0530  NA 141 140 141 140 138 140  K 3.4* 3.4* 3.6 3.3* 4.0 3.4*  CL 106 108 107 105 105 105  CO2 24 25 24 25 26 25   GLUCOSE 81 101* 82 89 108* 82  BUN 34* 33* 31* 28* 29* 27*  CREATININE 2.23* 2.34* 2.41* 2.34* 2.70* 2.50*  CALCIUM 8.4* 8.6* 8.5* 8.5* 8.7* 8.5*  MG 1.9 1.9  --   --   --   --   PHOS 3.4 3.2  --   --   --   --    Liver Function Tests:  Recent Labs Lab 03/30/15 2015 04/01/15 0607 04/02/15 0553 04/03/15 0525  AST 71* 63* 61* 74*  ALT 54 47 49 59*  ALKPHOS 68 56 54 60  BILITOT 1.0 1.0 0.7 1.9*  PROT 7.6 6.3* 6.1* 6.2*  ALBUMIN 3.6 3.0* 2.8* 2.9*    Recent Labs Lab 03/30/15 2015  LIPASE 61*   CBC:  Recent Labs Lab 03/30/15 2015  04/01/15 0607 04/02/15 0553 04/03/15 0525 04/04/15 0550 04/05/15 0554 04/06/15 0530  WBC 5.9  < > 4.6 4.9 5.1 5.0 4.4 3.6*  NEUTROABS 3.9  --  3.1  --   --   --   --   --   HGB 9.5*  < > 8.2* 7.7* 10.2* 10.1* 10.0* 9.8*  HCT 27.2*  < > 25.3* 23.0* 31.4* 30.8* 30.0* 28.8*  MCV 99.3  < > 100.8* 102.2* 95.7 95.1 97.1 97.0  PLT 235  < > 195 196 181 180 192 201  < > = values in this interval not displayed. Cardiac Enzymes:  Recent Labs Lab 03/31/15 0035 03/31/15 0620  TROPONINI 0.05* 0.04*   BNP (last 3 results)  Recent Labs  02/20/15 1645 02/21/15 1042 03/01/15 0839  BNP 463.9* 386.1* 452.0*   CBG:  Recent Labs Lab 04/05/15 0816 04/05/15 1212 04/05/15 2132 04/06/15 0750 04/06/15 1248  GLUCAP 99 124* 100* 75 69   Scheduled Meds: . sodium chloride   Intravenous Once  . amiodarone  200 mg Oral BID  . aspirin EC  325 mg Oral Daily  . atorvastatin  40 mg Oral q1800  . carvedilol  9.375 mg Oral BID WC  . insulin aspart  0-9 Units Subcutaneous TID WC  . isosorbide mononitrate  60 mg  Oral Daily  . levothyroxine  100 mcg Oral QAC breakfast  . multivitamin with minerals  1 tablet Oral Daily  . pantoprazole (PROTONIX) IV  40 mg Intravenous Q12H  . PARoxetine  10 mg Oral Daily  . polyethylene glycol-electrolytes  4,000 mL Oral Once  . potassium chloride SA  20 mEq Oral Daily  . sodium chloride  3 mL Intravenous Q12H  . torsemide  20 mg Oral Daily   Continuous Infusions: . sodium chloride    .  sodium chloride 20 mL/hr at 04/06/15 Flasher, MD [facility] Pager [phone]. If 7 PM - 7 AM, please contact night-coverage at [email or web address], password [facility] 04/06/2015, 1:18 PM  LOS: 7 days

## 2015-04-07 ENCOUNTER — Encounter (HOSPITAL_COMMUNITY)
Admission: EM | Disposition: A | Discharge status: Home Health | Payer: Self-pay | Source: Ambulatory Visit | Attending: Internal Medicine

## 2015-04-07 ENCOUNTER — Encounter (HOSPITAL_COMMUNITY): Payer: Self-pay | Admitting: *Deleted

## 2015-04-07 ENCOUNTER — Inpatient Hospital Stay (HOSPITAL_COMMUNITY): Payer: Medicare Other

## 2015-04-07 DIAGNOSIS — I509 Heart failure, unspecified: Secondary | ICD-10-CM

## 2015-04-07 DIAGNOSIS — I82409 Acute embolism and thrombosis of unspecified deep veins of unspecified lower extremity: Secondary | ICD-10-CM

## 2015-04-07 HISTORY — PX: COLONOSCOPY: SHX5424

## 2015-04-07 LAB — GLUCOSE, CAPILLARY
GLUCOSE-CAPILLARY: 85 mg/dL (ref 65–99)
GLUCOSE-CAPILLARY: 102 mg/dL — AB (ref 65–99)
Glucose-Capillary: 112 mg/dL — ABNORMAL HIGH (ref 65–99)
Glucose-Capillary: 122 mg/dL — ABNORMAL HIGH (ref 65–99)
Glucose-Capillary: 122 mg/dL — ABNORMAL HIGH (ref 65–99)

## 2015-04-07 SURGERY — COLONOSCOPY
Anesthesia: Moderate Sedation

## 2015-04-07 MED ORDER — MIDAZOLAM HCL 5 MG/5ML IJ SOLN
INTRAMUSCULAR | Status: DC | PRN
  Administered 2015-04-07: 2 mg via INTRAVENOUS
  Administered 2015-04-07: 1 mg via INTRAVENOUS
  Administered 2015-04-07: 2 mg via INTRAVENOUS

## 2015-04-07 MED ORDER — FENTANYL CITRATE (PF) 100 MCG/2ML IJ SOLN
INTRAMUSCULAR | Status: AC
  Filled 2015-04-07: qty 2

## 2015-04-07 MED ORDER — FENTANYL CITRATE (PF) 100 MCG/2ML IJ SOLN
INTRAMUSCULAR | Status: DC | PRN
  Administered 2015-04-07 (×2): 25 ug via INTRAVENOUS

## 2015-04-07 MED ORDER — POLYETHYLENE GLYCOL 3350 17 G PO PACK
17.0000 g | PACK | Freq: Two times a day (BID) | ORAL | Status: DC
  Administered 2015-04-07 – 2015-04-08 (×2): 17 g via ORAL
  Filled 2015-04-07 (×2): qty 1

## 2015-04-07 MED ORDER — MIDAZOLAM HCL 5 MG/ML IJ SOLN
INTRAMUSCULAR | Status: AC
  Filled 2015-04-07: qty 2

## 2015-04-07 MED ORDER — ASPIRIN EC 81 MG PO TBEC
81.0000 mg | DELAYED_RELEASE_TABLET | Freq: Every day | ORAL | Status: DC
  Administered 2015-04-07 – 2015-04-08 (×2): 81 mg via ORAL
  Filled 2015-04-07 (×2): qty 1

## 2015-04-07 MED ORDER — APIXABAN 5 MG PO TABS
5.0000 mg | ORAL_TABLET | Freq: Two times a day (BID) | ORAL | Status: DC
  Administered 2015-04-07 – 2015-04-08 (×3): 5 mg via ORAL
  Filled 2015-04-07 (×3): qty 1

## 2015-04-07 NOTE — Op Note (Signed)
[facility] [place], [address]   COLONOSCOPY PROCEDURE REPORT  PATIENT: [name] Terry, [name] Terry  MR#: [id number] BIRTHDATE: 07/28/1950 , 60  yrs. old GENDER: female ENDOSCOPIST: Laurence Spates, MD REFERRED BY:  [facility]. Dr Wynonia Lawman PROCEDURE DATE:  2015/04/20 PROCEDURE: ASA CLASS:   class 4 INDICATIONS:anemia and blood in stool with chronic abdominal pain. EGD 2 days ago was negative MEDICATIONS: fentanyl 50 g versed 4 mg IV  DESCRIPTION OF PROCEDURE:   After the risks and benefits and of the procedure were explained, informed consent was obtained.  digital exam was normal         The Pentax Ped Colon [id number]  endoscope was introduced through the anus and advanced to the cecum identified by appendiceal orifice and ICV ultrathin pediatric colonoscope was used      .  The quality of the prep was prep will spare there was some sticky adherent stool      .  The instrument was then slowly withdrawn as the colon was fully examined. Estimated blood loss is zero unless otherwise noted in this procedure report.The scope was withdrawn and the mucosa carefully examined. Moderate diverticulosis and the sigmoid colon, no polyps or masses throughout the entire colon. Internal hemorrhoids seen in the rectum.There was no sign of active or recent bleeding throughout the entire colon.   The scope was then withdrawn from the patient and the procedure completed.  WITHDRAWAL TIME:  COMPLICATIONS: There were no immediate complications. ENDOSCOPIC IMPRESSION: 1. Anemia and Hemoccult Positive Stool. No gross lesions in the: other than diverticulosis and internal hemorrhoids RECOMMENDATIONS: 1. we will go ahead and feed the patient Will continue her on Miralax  REPEAT EXAM:  cc:Dr. Jari Pigg  _______________________________ eSignedLaurence Spates, MD 2015-04-20 8:55 AM   CPT CODES: ICD CODES:  The ICD and CPT codes recommended by this software  are interpretations from the data that the clinical staff has captured with the software.  The verification of the translation of this report to the ICD and CPT codes and modifiers is the sole responsibility of the health care institution and practicing physician where this report was generated.  [facility]. will not be held responsible for the validity of the ICD and CPT codes included on this report.  AMA assumes no liability for data contained or not contained herein. CPT is a Designer, television/film set of the [employer].

## 2015-04-07 NOTE — Interval H&P Note (Signed)
History and Physical Interval Note:  04/07/2015 7:59 AM  [name] Terry  has presented today for surgery, with the diagnosis of GI Bleed  The various methods of treatment have been discussed with the patient and family. After consideration of risks, benefits and other options for treatment, the patient has consented to  Procedure(s): COLONOSCOPY (N/A) as a surgical intervention .  The patient's history has been reviewed, patient examined, no change in status, stable for surgery.  I have reviewed the patient's chart and labs.  Questions were answered to the patient's satisfaction.     Mamie Diiorio JR,Jumanah Hynson L

## 2015-04-07 NOTE — Progress Notes (Signed)
Patient dont want to drink her bowel prep, Since my shift stated she only drink 1 cup of it. Continue to monitor.

## 2015-04-07 NOTE — Progress Notes (Signed)
VASCULAR LAB PRELIMINARY  PRELIMINARY  PRELIMINARY  PRELIMINARY  Bilateral lower extremity venous duplex completed.    Preliminary report:  Bilateral:  No evidence of DVT, superficial thrombosis, or Baker's Cyst. Peroneal vein DVT noted 03/31/2015 appears to have resolved   Jene Oravec, Leon, RVS 04/07/2015, 12:57 PM

## 2015-04-07 NOTE — H&P (View-Only) (Signed)
Apparently orders for the preparation for colonoscopy as well as for npo timing were not followed in the patient did not take all the prep. Despite order to be NPO after 10 AM she apparently was still drinking liquids. For this reason the colonoscopy under MAC was canceled for today. We will therefore reschedule colonoscopy at 8 AM tomorrow, continue to finish the whole prep and in fact give her another one half-gallon of prep with additional enemas and continue clear liquids only  Pt on BS toilet denies abd pain at present For colon at 8 AM tomorrow. Went over with her again the need to be NPO after MN

## 2015-04-07 NOTE — Progress Notes (Signed)
PROGRESS NOTE  [name] Terry [id number] DOB: 1950/11/25 DOA: 03/30/2015 PCP: Pcp Not In System   HPI: 65 year old female with a past medical history of ischemic cardiomyopathy, chronic systolic congestive heart failure last EF 35-40% in 01/2015, CKD, DM2, CAD, PAF on Eliquis, STEMI in November 2016-treated medically at [facility] with a cardiac catheterization showing 100% stenosis of the proximal PDA, admitted on 1/6 with complaints of black stools.  Subjective / 24 H Interval events - seen after colonoscopy, no complaints today   Assessment/Plan: Principal Problem:   GI bleed Active Problems:   CHF (congestive heart failure) (HCC)   HTN (hypertension)   HLD (hyperlipidemia)   Cardiomyopathy, ischemic-EF 35-40%   Diabetes mellitus type 2 in obese (HCC)   GERD (gastroesophageal reflux disease)   Chronic kidney disease (CKD), stage IV (severe) (HCC)   Anxiety state   Hypothyroidism   GI bleeding - appreciate GI consultation, patient underwent an EGD on 1/9 without acute findings and a colonoscopy 1/13 without bleeding or ischemic changes. 0 I discussed with Dr. Oletta Lamas over the phone today, no objections from GI standpoint to resume anticoagulation.  - restart Eliquis, closely monitor CBCs   CAD S/P prior RCA PCI, cath 05/03/2013- med Rx, STEMI 01/2015 - 100% prox PDA, treated medically - Continue aspirin, hold Plavix given GI Bleed. Stents > 1 year ago - currently no chest pain - resumed Eliquis today. Continue to hold Plavix  PAF - CHADSVASC score 4,  - resume Eliquis  Ischemic cardiomyopathy and chronic systolic congestive heart failure - appears euvolemic  HTN (hypertension): Stable - continue coreg, lasix, toresemide, hydralazine, isosorbide mononitrate. - BP 113 / 62, no changes in her regimen  Cough with hemoptysis: Although patient reports continued hemoptysis H&H remains stable. - continue to monitor, no further episodes  Acute blood loss  anemia - s/p 2U pRBC on 1/8, Hb stable since. Clinically it appears that bleeding has stopped  - Hb stable   Acute deep vein thrombosis involving a small segment of the right mid peroneal vein - patient states that she was recently diagnosed with LLE DVT, however study here is negative - Dr. Sanjuana Letters discussed with IR regarding IVC filter placement, IR evaluated case on 1/7 and recommended holding off IVC filter for small DVT, repeat doppler in 1 week to evaluate propagation - will repeat doppler today to re-evaluate clot  Diabetes mellitus type 2 in obese (HCC) - SSI  Hypothyroidism - Continue levothyroxine  Chronic kidney disease stage IV - overall stable. Patient with a creatinine of 2.34 on admission it appears that her baseline creatinine 2.2-2.5 - Continue to monitor  Anxiety - Continue and Paxil and Xanax prn  Chronic Gerd with history of hiatal hernia - Continue Protonix and Carafate  Hyperlipidemia - Continue atorvastatin   Diet: Diet NPO time specified Fluids: none DVT Prophylaxis: SCD  Code Status: Full Code Family Communication: no family bedside  Disposition Plan: home when ready   Barriers to discharge: GI evaluation, bleeding  Consultants:  GI  Procedures:  EGD  Colonoscopy   Antibiotics  Anti-infectives    None       Studies  No results found.  Objective  Filed Vitals:   04/07/15 0900 04/07/15 0905 04/07/15 0910 04/07/15 0915  BP: 109/77  113/62   Pulse: 63 63 62 62  Temp:      TempSrc:      Resp: 24 34 25 24  Height:      Weight:  SpO2: 98% 100% 100% 100%    Intake/Output Summary (Last 24 hours) at 04/07/15 1051 Last data filed at 04/07/15 0501  Gross per 24 hour  Intake    240 ml  Output      0 ml  Net    240 ml   Filed Weights   04/05/15 0540 04/06/15 0556 04/07/15 0737  Weight: 88.6 kg (195 lb 5.2 oz) 86.7 kg (191 lb 2.2 oz) 86.637 kg (191 lb)    Exam:  GENERAL: NAD  HEENT: no scleral icterus,  PERRL  NECK: supple, no LAD  LUNGS: CTA biL, no wheezing  HEART: RRR without MRG  ABDOMEN: soft, non tender  EXTREMITIES: no clubbing / cyanosis  Data Reviewed: Basic Metabolic Panel:  Recent Labs Lab 04/01/15 0607 04/02/15 0553 04/03/15 0525 04/04/15 0550 04/05/15 0554 04/06/15 0530  NA 141 140 141 140 138 140  K 3.4* 3.4* 3.6 3.3* 4.0 3.4*  CL 106 108 107 105 105 105  CO2 24 25 24 25 26 25   GLUCOSE 81 101* 82 89 108* 82  BUN 34* 33* 31* 28* 29* 27*  CREATININE 2.23* 2.34* 2.41* 2.34* 2.70* 2.50*  CALCIUM 8.4* 8.6* 8.5* 8.5* 8.7* 8.5*  MG 1.9 1.9  --   --   --   --   PHOS 3.4 3.2  --   --   --   --    Liver Function Tests:  Recent Labs Lab 04/01/15 0607 04/02/15 0553 04/03/15 0525  AST 63* 61* 74*  ALT 47 49 59*  ALKPHOS 56 54 60  BILITOT 1.0 0.7 1.9*  PROT 6.3* 6.1* 6.2*  ALBUMIN 3.0* 2.8* 2.9*   No results for input(s): LIPASE, AMYLASE in the last 168 hours. CBC:  Recent Labs Lab 04/01/15 0607 04/02/15 0553 04/03/15 0525 04/04/15 0550 04/05/15 0554 04/06/15 0530  WBC 4.6 4.9 5.1 5.0 4.4 3.6*  NEUTROABS 3.1  --   --   --   --   --   HGB 8.2* 7.7* 10.2* 10.1* 10.0* 9.8*  HCT 25.3* 23.0* 31.4* 30.8* 30.0* 28.8*  MCV 100.8* 102.2* 95.7 95.1 97.1 97.0  PLT 195 196 181 180 192 201   Cardiac Enzymes: No results for input(s): CKTOTAL, CKMB, CKMBINDEX, TROPONINI in the last 168 hours. BNP (last 3 results)  Recent Labs  02/20/15 1645 02/21/15 1042 03/01/15 0839  BNP 463.9* 386.1* 452.0*   CBG:  Recent Labs Lab 04/05/15 2132 04/06/15 0750 04/06/15 1248 04/06/15 1554 04/06/15 1756  GLUCAP 100* 75 69 85 96   Scheduled Meds: . sodium chloride   Intravenous Once  . amiodarone  200 mg Oral BID  . apixaban  5 mg Oral BID  . aspirin EC  81 mg Oral Daily  . atorvastatin  40 mg Oral q1800  . carvedilol  9.375 mg Oral BID WC  . insulin aspart  0-9 Units Subcutaneous TID WC  . isosorbide mononitrate  60 mg Oral Daily  . levothyroxine  100  mcg Oral QAC breakfast  . multivitamin with minerals  1 tablet Oral Daily  . pantoprazole (PROTONIX) IV  40 mg Intravenous Q12H  . PARoxetine  10 mg Oral Daily  . potassium chloride SA  20 mEq Oral Daily  . sodium chloride  3 mL Intravenous Q12H  . torsemide  20 mg Oral Daily   Continuous Infusions: . sodium chloride     Marzetta Board, MD [facility] Pager [phone]. If 7 PM - 7 AM, please contact night-coverage at [email or web address], password [facility]  04/07/2015, 10:51 AM  LOS: 8 days

## 2015-04-08 DIAGNOSIS — E039 Hypothyroidism, unspecified: Secondary | ICD-10-CM

## 2015-04-08 DIAGNOSIS — F411 Generalized anxiety disorder: Secondary | ICD-10-CM

## 2015-04-08 LAB — GLUCOSE, CAPILLARY
GLUCOSE-CAPILLARY: 80 mg/dL (ref 65–99)
Glucose-Capillary: 108 mg/dL — ABNORMAL HIGH (ref 65–99)

## 2015-04-08 LAB — CBC
HCT: 30.3 % — ABNORMAL LOW (ref 36.0–46.0)
Hemoglobin: 10.4 g/dL — ABNORMAL LOW (ref 12.0–15.0)
MCH: 34 pg (ref 26.0–34.0)
MCHC: 34.3 g/dL (ref 30.0–36.0)
MCV: 99 fL (ref 78.0–100.0)
PLATELETS: 196 10*3/uL (ref 150–400)
RBC: 3.06 MIL/uL — ABNORMAL LOW (ref 3.87–5.11)
RDW: 20.7 % — ABNORMAL HIGH (ref 11.5–15.5)
WBC: 3.8 10*3/uL — ABNORMAL LOW (ref 4.0–10.5)

## 2015-04-08 LAB — BASIC METABOLIC PANEL
Anion gap: 11 (ref 5–15)
BUN: 29 mg/dL — AB (ref 6–20)
CALCIUM: 8.5 mg/dL — AB (ref 8.9–10.3)
CO2: 23 mmol/L (ref 22–32)
CREATININE: 2.9 mg/dL — AB (ref 0.44–1.00)
Chloride: 104 mmol/L (ref 101–111)
GFR, EST AFRICAN AMERICAN: 19 mL/min — AB (ref >60)
GFR, EST NON AFRICAN AMERICAN: 16 mL/min — AB (ref >60)
Glucose, Bld: 104 mg/dL — ABNORMAL HIGH (ref 65–99)
Potassium: 4 mmol/L (ref 3.5–5.1)
SODIUM: 138 mmol/L (ref 135–145)

## 2015-04-08 MED ORDER — OXYCODONE-ACETAMINOPHEN 5-325 MG PO TABS: 1.0000 | ORAL_TABLET | Freq: Three times a day (TID) | ORAL | Status: AC | PRN

## 2015-04-08 MED ORDER — CARVEDILOL 6.25 MG PO TABS: 6.2500 mg | ORAL_TABLET | Freq: Two times a day (BID) | ORAL | Status: AC

## 2015-04-08 NOTE — Discharge Summary (Signed)
Physician Discharge Summary  Edlin Papson Tirpak [id number] DOB: [birth year] DOA: 03/30/2015  PCP: Pcp Not In System  Admit date: 03/30/2015 Discharge date: 04/08/2015  Time spent: > 30 minutes  Recommendations for Outpatient Follow-up:  1. Follow up with Dr. Rayann Heman in 6 days as scheduled 2. Follow up with PCP in 2-3 weeks 3. Repeat BMP and CBC at follow up   Discharge Diagnoses:  Principal Problem:   GI bleed Active Problems:   CHF (congestive heart failure) (HCC)   HTN (hypertension)   HLD (hyperlipidemia)   Cardiomyopathy, ischemic-EF 35-40%   Diabetes mellitus type 2 in obese (HCC)   GERD (gastroesophageal reflux disease)   Chronic kidney disease (CKD), stage IV (severe) (HCC)   Anxiety state   Hypothyroidism  Discharge Condition: stable  Diet recommendation: heart healthy  Filed Weights   04/06/15 0556 04/07/15 0737 04/08/15 0530  Weight: 86.7 kg (191 lb 2.2 oz) 86.637 kg (191 lb) 86.3 kg (190 lb 4.1 oz)    History of present illness:  See H&P, Labs, Consult and Test reports for all details in brief, patient is a 65 year old female with a past medical history of ischemic cardiomyopathy, chronic systolic congestive heart failure last EF 35-40% in 01/2015, CKD, DM2, CAD, PAF on Eliquis, STEMI in November 2016-treated medically at [facility] with a cardiac catheterization showing 100% stenosis of the proximal PDA, admitted on 1/6 with complaints of black stools.  Hospital Course:  GI bleeding - appreciate GI consultation, patient underwent an EGD on 1/9 without acute findings and a colonoscopy 1/13 without bleeding or ischemic changes. I discussed with Dr. Oletta Lamas over the phone, no objections from GI standpoint to resume anticoagulation. Restart Eliquis without further bleeding and her CBCs has been stable.  CAD S/P prior RCA PCI, cath 05/03/2013- med Rx, STEMI 01/2015 - 100% prox PDA, treated medically. Continue aspirin, hold Plavix given GI Bleed. Stents > 1  year ago. Has a cardiology appointment next week PAF - CHADSVASC score 4, resume Eliquis Ischemic cardiomyopathy and chronic systolic congestive heart failure - appears euvolemic HTN (hypertension): Stable Cough with hemoptysis: resolved Acute blood loss anemia - s/p 2U pRBC on 1/8, Hb stable since. Clinically it appears that bleeding has stopped. Eliquis resumed without further GI bleed.  Acute deep vein thrombosis involving a small segment of the right mid peroneal vein - patient states that she was recently diagnosed with LLE DVT, however study here is negative. Dr. Sanjuana Letters discussed with IR regarding IVC filter placement, IR evaluated case on 1/7 and recommended holding off IVC filter for small DVT. Repeat Doppler with DVT resolved.  Diabetes mellitus type 2 in obese (HCC) - SSI Hypothyroidism - Continue levothyroxine Chronic kidney disease stage IV - overall stable, GFR 20-25.  Anxiety - Continue and Paxil and Xanax prn Chronic Gerd with history of hiatal hernia - Continue Protonix and Carafate Hyperlipidemia - Continue atorvastatin   Procedures:  EGD  Colonoscopy   Consultations:  GI  Discharge Exam: Filed Vitals:   04/07/15 0915 04/07/15 1523 04/07/15 2127 04/08/15 0530  BP:  95/66 95/64 98/45   Pulse: 62 59 65 64  Temp:  97.8 F (36.6 C) 97.6 F (36.4 C) 97.2 F (36.2 C)  TempSrc:  Oral Oral Oral  Resp: 24 20 20 20   Height:      Weight:    86.3 kg (190 lb 4.1 oz)  SpO2: 100% 100% 100% 100%    General: NAD Cardiovascular: RRR Respiratory: CTA biL  Discharge Instructions Activity:  As tolerated   Get Medicines reviewed and adjusted: Please take all your medications with you for your next visit with your Primary MD  Please request your Primary MD to go over all hospital tests and procedure/radiological results at the follow up, please ask your Primary MD to get all Hospital records sent to his/her office.  If you experience worsening of your admission  symptoms, develop shortness of breath, life threatening emergency, suicidal or homicidal thoughts you must seek medical attention immediately by calling 911 or calling your MD immediately if symptoms less severe.  You must read complete instructions/literature along with all the possible adverse reactions/side effects for all the Medicines you take and that have been prescribed to you. Take any new Medicines after you have completely understood and accpet all the possible adverse reactions/side effects.   Do not drive when taking Pain medications.   Do not take more than prescribed Pain, Sleep and Anxiety Medications  Special Instructions: If you have smoked or chewed Tobacco in the last 2 yrs please stop smoking, stop any regular Alcohol and or any Recreational drug use.  Wear Seat belts while driving.  Please note  You were cared for by a hospitalist during your hospital stay. Once you are discharged, your primary care physician will handle any further medical issues. Please note that NO REFILLS for any discharge medications will be authorized once you are discharged, as it is imperative that you return to your primary care physician (or establish a relationship with a primary care physician if you do not have one) for your aftercare needs so that they can reassess your need for medications and monitor your lab values.    Medication List    STOP taking these medications        clopidogrel 75 MG tablet  Commonly known as:  PLAVIX      TAKE these medications        ALPRAZolam 0.25 MG tablet  Commonly known as:  XANAX  Take 1-2 tablets (0.25-0.5 mg total) by mouth 3 (three) times daily as needed for anxiety.     amiodarone 200 MG tablet  Commonly known as:  PACERONE  Take 1 tablet (200 mg total) by mouth 2 (two) times daily.     aspirin EC 81 MG tablet  Take 81 mg by mouth daily.     atorvastatin 40 MG tablet  Commonly known as:  LIPITOR  Take 1 tablet (40 mg total) by mouth  daily at 6 PM.     carvedilol 6.25 MG tablet  Commonly known as:  COREG  Take 1 tablet (6.25 mg total) by mouth 2 (two) times daily with a meal.     ELIQUIS 5 MG Tabs tablet  Generic drug:  apixaban  Take 5 mg by mouth 2 (two) times daily.     GLIPIZIDE XL 2.5 MG 24 hr tablet  Generic drug:  glipiZIDE  Take 2.5 mg by mouth daily.     isosorbide mononitrate 60 MG 24 hr tablet  Commonly known as:  IMDUR  Take 1 tablet (60 mg total) by mouth daily.     levothyroxine 50 MCG tablet  Commonly known as:  SYNTHROID, LEVOTHROID  Take 1 tablet (50 mcg total) by mouth daily before breakfast.     MULTI VITAMIN DAILY PO  Take 1 tablet by mouth daily.     nitroGLYCERIN 0.4 MG SL tablet  Commonly known as:  NITROSTAT  Place 1 tablet (0.4 mg total) under the tongue every  5 (five) minutes as needed for chest pain.     ondansetron 4 MG tablet  Commonly known as:  ZOFRAN  Take 4 mg by mouth every 6 (six) hours as needed. Nausea     oxyCODONE-acetaminophen 5-325 MG tablet  Commonly known as:  PERCOCET/ROXICET  Take 1 tablet by mouth every 8 (eight) hours as needed for severe pain.     pantoprazole 40 MG tablet  Commonly known as:  PROTONIX  Take 1 tablet (40 mg total) by mouth 2 (two) times daily.     PARoxetine 10 MG tablet  Commonly known as:  PAXIL  Take 1 tablet (10 mg total) by mouth daily.     potassium chloride SA 20 MEQ tablet  Commonly known as:  K-DUR,KLOR-CON  Take 20 mEq by mouth daily.     promethazine 25 MG tablet  Commonly known as:  PHENERGAN  Take 25 mg by mouth every 6 (six) hours as needed for nausea or vomiting.     sucralfate 1 GM/10ML suspension  Commonly known as:  CARAFATE  Take 10 mLs (1 g total) by mouth 4 (four) times daily -  with meals and at bedtime.     torsemide 20 MG tablet  Commonly known as:  DEMADEX  Take 1 tablet (20 mg total) by mouth daily.     traMADol 50 MG tablet  Commonly known as:  ULTRAM  Take 1 tablet (50 mg total) by mouth  every 12 (twelve) hours as needed for moderate pain or severe pain.           Follow-up Information    Follow up with Thompson Grayer, MD. Schedule an appointment as soon as possible for a visit in 3 weeks.   Specialty:  Cardiology   Contact information:   [place] [id number] [phone]       The results of significant diagnostics from this hospitalization (including imaging, microbiology, ancillary and laboratory) are listed below for reference.    Significant Diagnostic Studies: Ct Abdomen Pelvis Wo Contrast  03/31/2015  CLINICAL DATA:  Black stools since Christmas. Intermittent aching epigastric pain starting after Christmas, worsened with eating. Guaiac positive stools at the PCP office reportedly. Associated symptoms of shortness of breath, intermittent chest pain, hemoptysis and anxiety. EXAM: CT ABDOMEN AND PELVIS WITHOUT CONTRAST TECHNIQUE: Multidetector CT imaging of the abdomen and pelvis was performed following the standard protocol without IV contrast. COMPARISON:  CT abdomen and pelvis dated 03/10/2015. FINDINGS: The lung bases are now clear. Interval resolution of the small right pleural effusion seen on earlier CT. Cardiomegaly appears grossly stable, incompletely imaged. Bowel is normal in caliber. Scattered diverticulosis noted throughout the descending and sigmoid colon without evidence of acute diverticulitis. No bowel wall thickening or evidence of bowel wall inflammation identified. Appendix is normal. Stomach is unremarkable. No free fluid or abscess collection identified. No free intraperitoneal air. Liver, spleen, pancreas, gallbladder, and adrenal glands are within normal limits for a noncontrast study. Bilateral renal cysts again noted, difficult to definitively characterize without intravascular contrast, but stable compared to multiple prior studies. Left kidney is somewhat atrophic, stable. No renal stone or hydronephrosis. No ureteral or  bladder calculi identified. Adrenal glands are unremarkable. Heavy atherosclerotic changes are seen along the walls of the normal-caliber abdominal aorta and branch vessels. Bilateral renal artery stents in place. Adnexal regions are unremarkable. Scattered degenerative changes are seen throughout the thoracolumbar spine but no acute osseous abnormality. Superficial soft tissues are unremarkable. IMPRESSION:  1. Colonic diverticulosis without evidence of acute diverticulitis. 2. Overall, no evidence of acute intra-abdominal or intrapelvic abnormality identified. Chronic/incidental findings detailed above, including extensive atherosclerotic changes. Electronically Signed   By: Franki Cabot M.D.   On: 03/31/2015 17:50   Ct Abdomen Pelvis Wo Contrast  03/10/2015  CLINICAL DATA:  Abdominal pain with nausea and vomiting. EXAM: CT ABDOMEN AND PELVIS WITHOUT CONTRAST TECHNIQUE: Multidetector CT imaging of the abdomen and pelvis was performed following the standard protocol without IV contrast. COMPARISON:  03/08/2015. FINDINGS: Lower chest: There is a small right pleural effusion. Atelectasis is noted overlying the left lower lobe. Heart size is enlarged. There is calcifications within the RCA, LAD and left circumflex coronary arteries. Hepatobiliary: No focal liver abnormality identified. No biliary dilatation. Increased attenuation within the gallbladder are noted which may represent gallbladder sludge. No gallbladder wall thickening noted. Pancreas: The pancreas is unremarkable. Spleen: Negative. Adrenals/Urinary Tract: The adrenal glands are negative. Bilateral renal cysts are identified. These are incompletely characterized without IV contrast. There is asymmetric left renal atrophy. No kidney stone or evidence of hydronephrosis identified. The urinary bladder appears normal for degree of distention. Stomach/Bowel: The stomach is normal. The small bowel loops are within normal limits. No dilated loops of small  bowel noted. The appendix is visualized and is normal. Unremarkable appearance of the proximal colon. Multiple distal colonic diverticula noted without acute inflammation. Vascular/Lymphatic: Aortic atherosclerosis noted. No enlarged retroperitoneal or mesenteric adenopathy. No enlarged pelvic or inguinal lymph nodes. Reproductive: Uterus and adnexal structures are unremarkable. Other: There is no free fluid identified.  No fluid collections. Musculoskeletal: Unremarkable. IMPRESSION: 1. No acute findings identified within the abdomen or pelvis. 2. Right pleural effusion. 3. Aortic atherosclerosis 4. Bilateral renal cyst. 5. Colonic diverticulosis without acute inflammation. Electronically Signed   By: Kerby Moors M.D.   On: 03/10/2015 18:29   Dg Chest Port 1 View  03/31/2015  CLINICAL DATA:  Sudden onset of upper abdominal and chest pain. Nausea. EXAM: PORTABLE CHEST 1 VIEW COMPARISON:  03/08/2015 FINDINGS: Dual lead left-sided pacemaker remains in place. Cardiomegaly is again seen, unchanged. Improved bilateral opacities from prior exam without new confluent airspace disease. Mild vascular congestion. No large pleural effusion or pneumothorax. IMPRESSION: Improved aeration from prior exam with near complete resolution of previous bilateral opacities. Mild vascular congestion and stable cardiomegaly. Electronically Signed   By: Jeb Levering M.D.   On: 03/31/2015 02:48   Dg Abd Portable 1v  03/31/2015  CLINICAL DATA:  Acute onset of severe upper abdominal pain and generalized chest pain. Nausea. Initial encounter. EXAM: PORTABLE ABDOMEN - 1 VIEW COMPARISON:  CT of the abdomen and pelvis performed 03/10/2015 FINDINGS: The visualized bowel gas pattern is unremarkable. Scattered air and stool filled loops of colon are seen; no abnormal dilatation of small bowel loops is seen to suggest small bowel obstruction. No free intra-abdominal air is identified, though evaluation for free air is limited on a single  supine view. Mild degenerative change is noted at the lower lumbar spine; the sacroiliac joints are unremarkable in appearance. The visualized lung bases are essentially clear. Pacemaker/AICD leads are partially imaged. IMPRESSION: Unremarkable bowel gas pattern; no free intra-abdominal air seen. Small amount of stool noted in the colon. Electronically Signed   By: Garald Balding M.D.   On: 03/31/2015 02:46   Labs: Basic Metabolic Panel:  Recent Labs Lab 04/02/15 0553 04/03/15 0525 04/04/15 0550 04/05/15 0554 04/06/15 0530 04/08/15 0603  NA 140 141 140 138 140 138  K 3.4* 3.6 3.3* 4.0 3.4* 4.0  CL 108 107 105 105 105 104  CO2 25 24 25 26 25 23   GLUCOSE 101* 82 89 108* 82 104*  BUN 33* 31* 28* 29* 27* 29*  CREATININE 2.34* 2.41* 2.34* 2.70* 2.50* 2.90*  CALCIUM 8.6* 8.5* 8.5* 8.7* 8.5* 8.5*  MG 1.9  --   --   --   --   --   PHOS 3.2  --   --   --   --   --    Liver Function Tests:  Recent Labs Lab 04/02/15 0553 04/03/15 0525  AST 61* 74*  ALT 49 59*  ALKPHOS 54 60  BILITOT 0.7 1.9*  PROT 6.1* 6.2*  ALBUMIN 2.8* 2.9*   CBC:  Recent Labs Lab 04/03/15 0525 04/04/15 0550 04/05/15 0554 04/06/15 0530 04/08/15 0603  WBC 5.1 5.0 4.4 3.6* 3.8*  HGB 10.2* 10.1* 10.0* 9.8* 10.4*  HCT 31.4* 30.8* 30.0* 28.8* 30.3*  MCV 95.7 95.1 97.1 97.0 99.0  PLT 181 180 192 201 196   BNP: BNP (last 3 results)  Recent Labs  02/20/15 1645 02/21/15 1042 03/01/15 0839  BNP 463.9* 386.1* 452.0*   CBG:  Recent Labs Lab 04/07/15 1146 04/07/15 1723 04/07/15 2242 04/08/15 0755 04/08/15 1233  GLUCAP 102* 122* 122* 80 108*       Signed:  GHERGHE, COSTIN  [facility] 04/08/2015, 4:22 PM

## 2015-04-08 NOTE — Discharge Instructions (Signed)
Follow with Pcp Not In System in 5-7 days  Please get a complete blood count and chemistry panel checked by your Primary MD at your next visit, and again as instructed by your Primary MD. Please get your medications reviewed and adjusted by your Primary MD.  Please request your Primary MD to go over all Hospital Tests and Procedure/Radiological results at the follow up, please get all Hospital records sent to your Prim MD by signing hospital release before you go home.  If you had Pneumonia of Lung problems at the Hospital: Please get a 2 view Chest X ray done in 6-8 weeks after hospital discharge or sooner if instructed by your Primary MD.  If you have Congestive Heart Failure: Please call your Cardiologist or Primary MD anytime you have any of the following symptoms:  1) 3 pound weight gain in 24 hours or 5 pounds in 1 week  2) shortness of breath, with or without a dry hacking cough  3) swelling in the hands, feet or stomach  4) if you have to sleep on extra pillows at night in order to breathe  Follow cardiac low salt diet and 1.5 lit/day fluid restriction.  If you have diabetes Accuchecks 4 times/day, Once in AM empty stomach and then before each meal. Log in all results and show them to your primary doctor at your next visit. If any glucose reading is under 80 or above 300 call your primary MD immediately.  If you have Seizure/Convulsions/Epilepsy: Please do not drive, operate heavy machinery, participate in activities at heights or participate in high speed sports until you have seen by Primary MD or a Neurologist and advised to do so again.  If you had Gastrointestinal Bleeding: Please ask your Primary MD to check a complete blood count within one week of discharge or at your next visit. Your endoscopic/colonoscopic biopsies that are pending at the time of discharge, will also need to followed by your Primary MD.  Get Medicines reviewed and adjusted. Please take all your  medications with you for your next visit with your Primary MD  Please request your Primary MD to go over all hospital tests and procedure/radiological results at the follow up, please ask your Primary MD to get all Hospital records sent to his/her office.  If you experience worsening of your admission symptoms, develop shortness of breath, life threatening emergency, suicidal or homicidal thoughts you must seek medical attention immediately by calling 911 or calling your MD immediately  if symptoms less severe.  You must read complete instructions/literature along with all the possible adverse reactions/side effects for all the Medicines you take and that have been prescribed to you. Take any new Medicines after you have completely understood and accpet all the possible adverse reactions/side effects.   Do not drive or operate heavy machinery when taking Pain medications.   Do not take more than prescribed Pain, Sleep and Anxiety Medications  Special Instructions: If you have smoked or chewed Tobacco  in the last 2 yrs please stop smoking, stop any regular Alcohol  and or any Recreational drug use.  Wear Seat belts while driving.  Please note You were cared for by a hospitalist during your hospital stay. If you have any questions about your discharge medications or the care you received while you were in the hospital after you are discharged, you can call the unit and asked to speak with the hospitalist on call if the hospitalist that took care of you is not available.  Once you are discharged, your primary care physician will handle any further medical issues. Please note that NO REFILLS for any discharge medications will be authorized once you are discharged, as it is imperative that you return to your primary care physician (or establish a relationship with a primary care physician if you do not have one) for your aftercare needs so that they can reassess your need for medications and monitor your  lab values.  You can reach the hospitalist office at phone [phone] or fax [phone]   If you do not have a primary care physician, you can call [phone] for a physician referral.  Activity: As tolerated with Full fall precautions use walker/cane & assistance as needed  Diet: heart healthy  Disposition Home

## 2015-04-08 NOTE — Progress Notes (Signed)
[facility] PROGRESS NOTE Subjective Pt w/o specific complaints this AM  Objective: Vital signs in last 24 hours: Temp:  [97.2 F (36.2 C)-97.8 F (36.6 C)] 97.2 F (36.2 C) (01/14 0530) Pulse Rate:  [59-65] 64 (01/14 0530) Resp:  [20] 20 (01/14 0530) BP: (95-98)/(45-66) 98/45 mmHg (01/14 0530) SpO2:  [100 %] 100 % (01/14 0530) Weight:  [86.3 kg (190 lb 4.1 oz)] 86.3 kg (190 lb 4.1 oz) (01/14 0530) Last BM Date: 04/07/15  Intake/Output from previous day: 01/13 0701 - 01/14 0700 In: 960 [P.O.:960] Out: -  Intake/Output this shift: Total I/O In: 240 [P.O.:240] Out: -   PE: General--NAD  Abdomen--nontender  Lab Results:  Recent Labs  04/06/15 0530 04/08/15 0603  WBC 3.6* 3.8*  HGB 9.8* 10.4*  HCT 28.8* 30.3*  PLT 201 196   BMET  Recent Labs  04/06/15 0530 04/08/15 0603  NA 140 138  K 3.4* 4.0  CL 105 104  CO2 25 23  CREATININE 2.50* 2.90*   LFT No results for input(s): PROT, AST, ALT, ALKPHOS, BILITOT, BILIDIR, IBILI in the last 72 hours. PT/INR No results for input(s): LABPROT, INR in the last 72 hours. PANCREAS No results for input(s): LIPASE in the last 72 hours.       Studies/Results: No results found.  Medications: I have reviewed the patient's current medications.  Assessment/Plan: 1. Anemia/+ stool.EGD and colon both negative and stools were brown yesterday. Should be ok to discharge on Miralax if bleeding recures suggest bleeding scan. Please call us back for problems.  Jemma Rasp JR,Dionysios Massman L 04/08/2015, 12:07 PM  This note was created using voice recognition software. Minor errors may Have occurred unintentionally.  Pager: [phone] If no answer or after hours call [phone]

## 2015-04-10 ENCOUNTER — Encounter (HOSPITAL_COMMUNITY): Payer: Self-pay | Admitting: Gastroenterology

## 2015-04-13 ENCOUNTER — Emergency Department (HOSPITAL_COMMUNITY): Payer: Medicare Other

## 2015-04-13 ENCOUNTER — Inpatient Hospital Stay (HOSPITAL_COMMUNITY)
Admission: EM | Admit: 2015-04-13 | Discharge: 2015-04-19 | DRG: 291 | Disposition: A | Discharge status: Skilled Nursing Facility | Payer: Medicare Other | Attending: Internal Medicine | Admitting: Internal Medicine

## 2015-04-13 ENCOUNTER — Encounter (HOSPITAL_COMMUNITY): Payer: Self-pay | Admitting: *Deleted

## 2015-04-13 DIAGNOSIS — Z6830 Body mass index (BMI) 30.0-30.9, adult: Secondary | ICD-10-CM | POA: Diagnosis not present

## 2015-04-13 DIAGNOSIS — Z803 Family history of malignant neoplasm of breast: Secondary | ICD-10-CM | POA: Diagnosis not present

## 2015-04-13 DIAGNOSIS — I480 Paroxysmal atrial fibrillation: Secondary | ICD-10-CM | POA: Diagnosis present

## 2015-04-13 DIAGNOSIS — Z811 Family history of alcohol abuse and dependence: Secondary | ICD-10-CM | POA: Diagnosis not present

## 2015-04-13 DIAGNOSIS — K219 Gastro-esophageal reflux disease without esophagitis: Secondary | ICD-10-CM | POA: Diagnosis present

## 2015-04-13 DIAGNOSIS — E1140 Type 2 diabetes mellitus with diabetic neuropathy, unspecified: Secondary | ICD-10-CM | POA: Diagnosis present

## 2015-04-13 DIAGNOSIS — M353 Polymyalgia rheumatica: Secondary | ICD-10-CM | POA: Diagnosis present

## 2015-04-13 DIAGNOSIS — Z7984 Long term (current) use of oral hypoglycemic drugs: Secondary | ICD-10-CM | POA: Diagnosis not present

## 2015-04-13 DIAGNOSIS — Z823 Family history of stroke: Secondary | ICD-10-CM | POA: Diagnosis not present

## 2015-04-13 DIAGNOSIS — I130 Hypertensive heart and chronic kidney disease with heart failure and stage 1 through stage 4 chronic kidney disease, or unspecified chronic kidney disease: Secondary | ICD-10-CM | POA: Diagnosis present

## 2015-04-13 DIAGNOSIS — J189 Pneumonia, unspecified organism: Secondary | ICD-10-CM | POA: Diagnosis present

## 2015-04-13 DIAGNOSIS — Z95810 Presence of automatic (implantable) cardiac defibrillator: Secondary | ICD-10-CM

## 2015-04-13 DIAGNOSIS — Z833 Family history of diabetes mellitus: Secondary | ICD-10-CM | POA: Diagnosis not present

## 2015-04-13 DIAGNOSIS — Z7982 Long term (current) use of aspirin: Secondary | ICD-10-CM | POA: Diagnosis not present

## 2015-04-13 DIAGNOSIS — E785 Hyperlipidemia, unspecified: Secondary | ICD-10-CM | POA: Diagnosis present

## 2015-04-13 DIAGNOSIS — Z87891 Personal history of nicotine dependence: Secondary | ICD-10-CM

## 2015-04-13 DIAGNOSIS — I5021 Acute systolic (congestive) heart failure: Secondary | ICD-10-CM

## 2015-04-13 DIAGNOSIS — Z8673 Personal history of transient ischemic attack (TIA), and cerebral infarction without residual deficits: Secondary | ICD-10-CM

## 2015-04-13 DIAGNOSIS — Z955 Presence of coronary angioplasty implant and graft: Secondary | ICD-10-CM | POA: Diagnosis not present

## 2015-04-13 DIAGNOSIS — R7989 Other specified abnormal findings of blood chemistry: Secondary | ICD-10-CM | POA: Diagnosis not present

## 2015-04-13 DIAGNOSIS — I252 Old myocardial infarction: Secondary | ICD-10-CM | POA: Diagnosis not present

## 2015-04-13 DIAGNOSIS — I255 Ischemic cardiomyopathy: Secondary | ICD-10-CM | POA: Diagnosis present

## 2015-04-13 DIAGNOSIS — R0602 Shortness of breath: Secondary | ICD-10-CM | POA: Diagnosis present

## 2015-04-13 DIAGNOSIS — F419 Anxiety disorder, unspecified: Secondary | ICD-10-CM | POA: Diagnosis present

## 2015-04-13 DIAGNOSIS — E43 Unspecified severe protein-calorie malnutrition: Secondary | ICD-10-CM | POA: Diagnosis present

## 2015-04-13 DIAGNOSIS — N184 Chronic kidney disease, stage 4 (severe): Secondary | ICD-10-CM | POA: Diagnosis present

## 2015-04-13 DIAGNOSIS — J449 Chronic obstructive pulmonary disease, unspecified: Secondary | ICD-10-CM | POA: Diagnosis present

## 2015-04-13 DIAGNOSIS — Z8572 Personal history of non-Hodgkin lymphomas: Secondary | ICD-10-CM | POA: Diagnosis not present

## 2015-04-13 DIAGNOSIS — Z9221 Personal history of antineoplastic chemotherapy: Secondary | ICD-10-CM | POA: Diagnosis not present

## 2015-04-13 DIAGNOSIS — Z79899 Other long term (current) drug therapy: Secondary | ICD-10-CM | POA: Diagnosis not present

## 2015-04-13 DIAGNOSIS — N183 Chronic kidney disease, stage 3 unspecified: Secondary | ICD-10-CM | POA: Diagnosis present

## 2015-04-13 DIAGNOSIS — Z9861 Coronary angioplasty status: Secondary | ICD-10-CM

## 2015-04-13 DIAGNOSIS — I5023 Acute on chronic systolic (congestive) heart failure: Secondary | ICD-10-CM | POA: Diagnosis present

## 2015-04-13 DIAGNOSIS — E872 Acidosis: Secondary | ICD-10-CM | POA: Diagnosis present

## 2015-04-13 DIAGNOSIS — Z8249 Family history of ischemic heart disease and other diseases of the circulatory system: Secondary | ICD-10-CM | POA: Diagnosis not present

## 2015-04-13 DIAGNOSIS — Z86718 Personal history of other venous thrombosis and embolism: Secondary | ICD-10-CM

## 2015-04-13 DIAGNOSIS — J9600 Acute respiratory failure, unspecified whether with hypoxia or hypercapnia: Secondary | ICD-10-CM

## 2015-04-13 DIAGNOSIS — J9601 Acute respiratory failure with hypoxia: Secondary | ICD-10-CM | POA: Diagnosis present

## 2015-04-13 DIAGNOSIS — D631 Anemia in chronic kidney disease: Secondary | ICD-10-CM | POA: Diagnosis present

## 2015-04-13 DIAGNOSIS — R0902 Hypoxemia: Secondary | ICD-10-CM

## 2015-04-13 DIAGNOSIS — J440 Chronic obstructive pulmonary disease with acute lower respiratory infection: Secondary | ICD-10-CM | POA: Diagnosis present

## 2015-04-13 DIAGNOSIS — R1012 Left upper quadrant pain: Secondary | ICD-10-CM | POA: Diagnosis not present

## 2015-04-13 DIAGNOSIS — I2510 Atherosclerotic heart disease of native coronary artery without angina pectoris: Secondary | ICD-10-CM | POA: Diagnosis present

## 2015-04-13 DIAGNOSIS — E039 Hypothyroidism, unspecified: Secondary | ICD-10-CM | POA: Diagnosis present

## 2015-04-13 DIAGNOSIS — K5900 Constipation, unspecified: Secondary | ICD-10-CM | POA: Diagnosis present

## 2015-04-13 DIAGNOSIS — Z923 Personal history of irradiation: Secondary | ICD-10-CM | POA: Diagnosis not present

## 2015-04-13 DIAGNOSIS — E1122 Type 2 diabetes mellitus with diabetic chronic kidney disease: Secondary | ICD-10-CM | POA: Diagnosis present

## 2015-04-13 DIAGNOSIS — R109 Unspecified abdominal pain: Secondary | ICD-10-CM | POA: Insufficient documentation

## 2015-04-13 DIAGNOSIS — R1010 Upper abdominal pain, unspecified: Secondary | ICD-10-CM | POA: Diagnosis not present

## 2015-04-13 LAB — HEPATIC FUNCTION PANEL
ALK PHOS: 86 U/L (ref 38–126)
ALT: 106 U/L — AB (ref 14–54)
AST: 121 U/L — ABNORMAL HIGH (ref 15–41)
Albumin: 3.3 g/dL — ABNORMAL LOW (ref 3.5–5.0)
BILIRUBIN DIRECT: 0.3 mg/dL (ref 0.1–0.5)
BILIRUBIN INDIRECT: 0.9 mg/dL (ref 0.3–0.9)
BILIRUBIN TOTAL: 1.2 mg/dL (ref 0.3–1.2)
Total Protein: 7.8 g/dL (ref 6.5–8.1)

## 2015-04-13 LAB — I-STAT TROPONIN, ED: TROPONIN I, POC: 0.03 ng/mL (ref 0.00–0.08)

## 2015-04-13 LAB — I-STAT CHEM 8, ED
BUN: 24 mg/dL — AB (ref 6–20)
CALCIUM ION: 1.01 mmol/L — AB (ref 1.13–1.30)
Chloride: 98 mmol/L — ABNORMAL LOW (ref 101–111)
Creatinine, Ser: 2.2 mg/dL — ABNORMAL HIGH (ref 0.44–1.00)
Glucose, Bld: 127 mg/dL — ABNORMAL HIGH (ref 65–99)
HEMATOCRIT: 39 % (ref 36.0–46.0)
Hemoglobin: 13.3 g/dL (ref 12.0–15.0)
Potassium: 5.3 mmol/L — ABNORMAL HIGH (ref 3.5–5.1)
SODIUM: 135 mmol/L (ref 135–145)
TCO2: 28 mmol/L (ref 0–100)

## 2015-04-13 LAB — CBC WITH DIFFERENTIAL/PLATELET
Basophils Absolute: 0 10*3/uL (ref 0.0–0.1)
Basophils Relative: 0 %
EOS ABS: 0.1 10*3/uL (ref 0.0–0.7)
Eosinophils Relative: 2 %
HEMATOCRIT: 37.1 % (ref 36.0–46.0)
HEMOGLOBIN: 12.7 g/dL (ref 12.0–15.0)
LYMPHS ABS: 1.5 10*3/uL (ref 0.7–4.0)
Lymphocytes Relative: 24 %
MCH: 33.2 pg (ref 26.0–34.0)
MCHC: 34.2 g/dL (ref 30.0–36.0)
MCV: 97.1 fL (ref 78.0–100.0)
MONOS PCT: 7 %
Monocytes Absolute: 0.4 10*3/uL (ref 0.1–1.0)
NEUTROS PCT: 67 %
Neutro Abs: 4.2 10*3/uL (ref 1.7–7.7)
Platelets: 228 10*3/uL (ref 150–400)
RBC: 3.82 MIL/uL — ABNORMAL LOW (ref 3.87–5.11)
RDW: 19.4 % — ABNORMAL HIGH (ref 11.5–15.5)
WBC: 6.2 10*3/uL (ref 4.0–10.5)

## 2015-04-13 LAB — I-STAT CG4 LACTIC ACID, ED: Lactic Acid, Venous: 2.88 mmol/L (ref 0.5–2.0)

## 2015-04-13 LAB — BRAIN NATRIURETIC PEPTIDE: B Natriuretic Peptide: 894 pg/mL — ABNORMAL HIGH (ref 0.0–100.0)

## 2015-04-13 MED ORDER — FUROSEMIDE 10 MG/ML IJ SOLN
80.0000 mg | Freq: Once | INTRAMUSCULAR | Status: AC
  Administered 2015-04-13: 80 mg via INTRAVENOUS
  Filled 2015-04-13: qty 8

## 2015-04-13 MED ORDER — OXYCODONE-ACETAMINOPHEN 5-325 MG PO TABS
1.0000 | ORAL_TABLET | Freq: Once | ORAL | Status: AC
  Administered 2015-04-13: 1 via ORAL
  Filled 2015-04-13: qty 1

## 2015-04-13 MED ORDER — ONDANSETRON HCL 4 MG/2ML IJ SOLN
4.0000 mg | Freq: Once | INTRAMUSCULAR | Status: AC
  Administered 2015-04-13: 4 mg via INTRAVENOUS
  Filled 2015-04-13: qty 2

## 2015-04-13 MED ORDER — IPRATROPIUM-ALBUTEROL 0.5-2.5 (3) MG/3ML IN SOLN
3.0000 mL | Freq: Once | RESPIRATORY_TRACT | Status: AC
  Administered 2015-04-13: 3 mL via RESPIRATORY_TRACT
  Filled 2015-04-13: qty 3

## 2015-04-13 MED ORDER — ALBUTEROL SULFATE (2.5 MG/3ML) 0.083% IN NEBU
2.5000 mg | INHALATION_SOLUTION | Freq: Once | RESPIRATORY_TRACT | Status: AC
  Administered 2015-04-13: 2.5 mg via RESPIRATORY_TRACT
  Filled 2015-04-13: qty 3

## 2015-04-13 NOTE — ED Provider Notes (Addendum)
CSN: [id number]     Arrival date & time 04/13/15  2128 History  .  Electronically Signed: Forrestine Him, ED Scribe. 04/13/2015. 10:04 PM.   Chief Complaint  Patient presents with  . Abdominal Pain    Patient is a 65 y.o. female presenting with chest pain. The history is provided by the patient. No language interpreter was used.  Chest Pain Pain location:  Unable to specify Pain radiates to:  Does not radiate Pain radiates to the back: no   Pain severity:  Moderate Duration:  1 day Timing:  Constant Progression:  Worsening Relieved by:  None tried Ineffective treatments:  None tried Associated symptoms: shortness of breath   Associated symptoms: no back pain and no headache      HPI Comments: Kenyona Litterio Schnee is a 65 y.o. female with a PMHx of CAD, HTN, HLD, DM2, CKD, PAF, gastritis who presents to the Emergency Department complaining of constant chest pain with associated SOB, onset 4 PM today. Pt rates pain 2/10 but states that the pain is worsening every 20 minutes. Pt reports no alleviating factors at this time. Pt is not typically on oxygen at home. Pt was admitted to [facility] 6 days ago for abdominal pain. At time of discharge, she was prescribed medications but did not get them filled due to drug store being closed. Pt not on dialysis.     PCP: Pcp Not In System   Past Medical History  Diagnosis Date  . Ischemic cardiomyopathy     a. s/p MDT dual chamber ICD implanted 2013 by Dr Westley Gambles at [facility], now followed by Dr Rayann Heman. b. LVEF 30-35% by echo 01/2015 at [facility] per report; 35-40% by echo 01/2015 at [facility].  Marland Kitchen History of stroke      a. 1993  . Coronary artery disease     a. s/p previous interventions in [place] per patient, no records available. b. cath 04/2013 with non-obstructive disease. c. STEMI 01/2015 @ [facility] - cardiac cath had shown 100% stenosis of prox PDA, patent stent in RCA with otherwise normal coronary arteries.  . Lymphoma ([place])     a. s/p  chemo/radiation 2009  . Chronic systolic CHF (congestive heart failure) ([place])   . HTN (hypertension)   . HLD (hyperlipidemia)   . Ventricular tachycardia ([place])     a. CL 300 msec requiring ICD shocks therpay 5/14 b. recurrent VT 04/2014, placed on amiodarone c. recurrent VT 05/2014 s/p ablation by Dr Rayann Heman d. s/p repeat ablation 07/2014  . DM2 (diabetes mellitus, type 2), newly diagnosed    . CKD (chronic kidney disease) stage 3, GFR 30-59 ml/min    . Polymyalgia rheumatica ([place])   . Depression   . GERD (gastroesophageal reflux disease)   . Renal atrophy, left     a. [place] 02/2015: "Left renal atrophy, likely from chronic renal arterial stenosis"  . Mitral regurgitation     a. mod by echo 01/2015.  . Diverticulosis     a. By CT 02/2015.  Marland Kitchen PAF (paroxysmal atrial fibrillation) ([place])     a. Brief paroxysms during admission 02/2015 - amiodarone increased. Not placed on anticoag due to dual antiplatelet therapy and bleeding risk including in the setting of possible GI pathology.   . Hiatal hernia     a. by EGD 06/2014.  Marland Kitchen Gastritis     a. by EGD 06/2014.  Marland Kitchen Anxiousness 03/02/2015   Past Surgical History  Procedure Laterality Date  . Cardiac defibrillator placement  03/2011  MDT ICD implanted at [facility] by Dr Tana Coast  . Coronary angioplasty with stent placement    . Left and right heart catheterization with coronary angiogram N/A 05/03/2013    non-obstructive CAD  . V-tach ablation N/A 05/26/2014    VT ablation by Dr Rayann Heman - PVCs arising from the inferolateral LV, extensive substrate ablation  . Electrophysiologic study N/A 08/04/2014    Procedure: V Tach Ablation;  Surgeon: Thompson Grayer, MD;  Location: [facility];  Service: Cardiovascular;  Laterality: N/A;  . Esophagogastroduodenoscopy (egd) with propofol N/A 04/03/2015    Procedure: ESOPHAGOGASTRODUODENOSCOPY (EGD) WITH PROPOFOL;  Surgeon: Laurence Spates, MD;  Location: [facility];  Service: Endoscopy;  Laterality: N/A;  . Colonoscopy  N/A 04/07/2015    Procedure: COLONOSCOPY;  Surgeon: Laurence Spates, MD;  Location: [facility];  Service: Endoscopy;  Laterality: N/A;   Family History  Problem Relation Age of Onset  . Diabetes Father   . Hypertension Father   . Heart disease Father   . Alcoholism Father   . Hypertension Mother   . Heart disease Mother   . Breast cancer Mother   . Stroke Mother   . Diabetes Brother   . Diabetes Brother   . Diabetes Brother   . Diabetes Brother   . Hypertension Sister   . Heart disease Sister   . Alcoholism Sister   . Thyroid disease Sister   . Heart attack Sister   . Heart attack Brother   . Stroke Brother    Social History  Substance Use Topics  . Smoking status: Former Smoker -- 0.25 packs/day    Quit date: 05/20/2014  . Smokeless tobacco: Never Used     Comment: she is not ready to quit  . Alcohol Use: No   OB History    No data available     Review of Systems  Constitutional: Negative for appetite change.  HENT: Negative for congestion, ear discharge and sinus pressure.   Eyes: Negative for discharge.  Respiratory: Positive for shortness of breath.   Cardiovascular: Positive for chest pain.  Genitourinary: Negative for frequency.  Musculoskeletal: Negative for back pain.  Skin: Negative for rash.  Neurological: Negative for seizures and headaches.  Psychiatric/Behavioral: Negative for hallucinations.      Allergies  Flagyl; Contrast media; Ioxaglate; Potassium sulfate; and Potassium-containing compounds  Home Medications   Prior to Admission medications   Medication Sig Start Date End Date Taking? Authorizing Provider  ALPRAZolam (XANAX) 0.25 MG tablet Take 1-2 tablets (0.25-0.5 mg total) by mouth 3 (three) times daily as needed for anxiety. 03/02/15   Rexene Alberts, MD  amiodarone (PACERONE) 200 MG tablet Take 1 tablet (200 mg total) by mouth 2 (two) times daily. 02/28/15   Dayna N Dunn, PA-C  apixaban (ELIQUIS) 5 MG TABS tablet Take 5 mg by mouth 2  (two) times daily.    Historical Provider, MD  aspirin EC 81 MG tablet Take 81 mg by mouth daily.    Historical Provider, MD  atorvastatin (LIPITOR) 40 MG tablet Take 1 tablet (40 mg total) by mouth daily at 6 PM. 06/20/14   Thompson Grayer, MD  carvedilol (COREG) 6.25 MG tablet Take 1 tablet (6.25 mg total) by mouth 2 (two) times daily with a meal. 04/08/15   Costin Karlyne Greenspan, MD  GLIPIZIDE XL 2.5 MG 24 hr tablet Take 2.5 mg by mouth daily. 03/21/15   Historical Provider, MD  isosorbide mononitrate (IMDUR) 60 MG 24 hr tablet Take 1 tablet (60 mg total)  by mouth daily. 03/12/15   Allie Bossier, MD  levothyroxine (SYNTHROID, LEVOTHROID) 50 MCG tablet Take 1 tablet (50 mcg total) by mouth daily before breakfast. 03/12/15   Allie Bossier, MD  Multiple Vitamin (MULTI VITAMIN DAILY PO) Take 1 tablet by mouth daily.    Historical Provider, MD  nitroGLYCERIN (NITROSTAT) 0.4 MG SL tablet Place 1 tablet (0.4 mg total) under the tongue every 5 (five) minutes as needed for chest pain. 07/04/14   Amber Sena Slate, NP  ondansetron (ZOFRAN) 4 MG tablet Take 4 mg by mouth every 6 (six) hours as needed. Nausea 03/18/15   Historical Provider, MD  oxyCODONE-acetaminophen (PERCOCET/ROXICET) 5-325 MG tablet Take 1 tablet by mouth every 8 (eight) hours as needed for severe pain. 04/08/15   Costin Karlyne Greenspan, MD  pantoprazole (PROTONIX) 40 MG tablet Take 1 tablet (40 mg total) by mouth 2 (two) times daily. 02/28/15   Dayna N Dunn, PA-C  PARoxetine (PAXIL) 10 MG tablet Take 1 tablet (10 mg total) by mouth daily. 03/12/15   Allie Bossier, MD  potassium chloride SA (K-DUR,KLOR-CON) 20 MEQ tablet Take 20 mEq by mouth daily.    Historical Provider, MD  promethazine (PHENERGAN) 25 MG tablet Take 25 mg by mouth every 6 (six) hours as needed for nausea or vomiting.    Historical Provider, MD  sucralfate (CARAFATE) 1 GM/10ML suspension Take 10 mLs (1 g total) by mouth 4 (four) times daily -  with meals and at bedtime. 02/28/15   Dayna N  Dunn, PA-C  torsemide (DEMADEX) 20 MG tablet Take 1 tablet (20 mg total) by mouth daily. 03/12/15   Allie Bossier, MD  traMADol (ULTRAM) 50 MG tablet Take 1 tablet (50 mg total) by mouth every 12 (twelve) hours as needed for moderate pain or severe pain. 02/28/15   Dayna N Dunn, PA-C   BP 155/107 mmHg  Pulse 106  Temp(Src) 98.4 F (36.9 C) (Oral)  Resp 22  Wt 190 lb (86.183 kg)  SpO2 80% Physical Exam  Constitutional: She is oriented to person, place, and time. She appears well-developed.  HENT:  Head: Normocephalic.  Eyes: Conjunctivae and EOM are normal. No scleral icterus.  Neck: Neck supple. No thyromegaly present.  Cardiovascular: Normal rate and regular rhythm.  Exam reveals no gallop and no friction rub.   No murmur heard. Pulmonary/Chest: No stridor. She has wheezes. She has no rales. She exhibits no tenderness.  Mild wheezing bilaterally.  Abdominal: She exhibits no distension. There is no tenderness. There is no rebound.  Musculoskeletal: Normal range of motion. She exhibits no edema.  Lymphadenopathy:    She has no cervical adenopathy.  Neurological: She is oriented to person, place, and time. She exhibits normal muscle tone. Coordination normal.  Skin: No rash noted. No erythema.  Psychiatric: She has a normal mood and affect. Her behavior is normal.    ED Course  Procedures  DIAGNOSTIC STUDIES: Oxygen Saturation is 80% on 3L, low by my interpretation.    COORDINATION OF CARE:  9:45 PM-Discussed treatment plan which includes breathing treatment, Lasix, blood work, EKG, and CXR with pt at bedside and pt agreed to plan.    Labs Review Labs Reviewed - No data to display  Imaging Review No results found. I have personally reviewed and evaluated these images and lab results as part of my medical decision-making.   EKG Interpretation   Date/Time:  Thursday April 13 2015 21:55:08 EST Ventricular Rate:  105 PR Interval:  70  QRS Duration: 130 QT Interval:   370 QTC Calculation: 489 R Axis:   8 Text Interpretation:  Sinus tachycardia Left bundle branch block Confirmed  by Kyoko Elsea  MD, Mannie Wineland [id number]) on 04/13/2015 10:25:49 PM     CRITICAL CARE Performed by: Zophia Marrone L Total critical care time: 35 minutes Critical care time was exclusive of separately billable procedures and treating other patients. Critical care was necessary to treat or prevent imminent or life-threatening deterioration. Critical care was time spent personally by me on the following activities: development of treatment plan with patient and/or surrogate as well as nursing, discussions with consultants, evaluation of patient's response to treatment, examination of patient, obtaining history from patient or surrogate, ordering and performing treatments and interventions, ordering and review of laboratory studies, ordering and review of radiographic studies, pulse oximetry and re-evaluation of patient's condition.  MDM   Final diagnoses:  None   chf admit The chart was scribed for me under my direct supervision.  I personally performed the history, physical, and medical decision making and all procedures in the evaluation of this patient..  The chart was scribed for me under my direct supervision.  I personally performed the history, physical, and medical decision making and all procedures in the evaluation of this patient.Milton Ferguson, MD 04/13/15 [id number]  Milton Ferguson, MD 05/01/15 2154

## 2015-04-13 NOTE — ED Notes (Signed)
Pt reporting generalized abdominal pain and associated nausea.  Pt discharged from [facility] about 5 pm following inpatient admission for same.  Pt reports symptoms since November, with minimal improvement.

## 2015-04-14 ENCOUNTER — Encounter: Payer: Medicare Other | Admitting: Internal Medicine

## 2015-04-14 ENCOUNTER — Inpatient Hospital Stay (HOSPITAL_COMMUNITY): Payer: Medicare Other

## 2015-04-14 ENCOUNTER — Encounter (HOSPITAL_COMMUNITY): Payer: Self-pay | Admitting: *Deleted

## 2015-04-14 DIAGNOSIS — I255 Ischemic cardiomyopathy: Secondary | ICD-10-CM

## 2015-04-14 DIAGNOSIS — Z9861 Coronary angioplasty status: Secondary | ICD-10-CM

## 2015-04-14 DIAGNOSIS — J449 Chronic obstructive pulmonary disease, unspecified: Secondary | ICD-10-CM | POA: Diagnosis present

## 2015-04-14 DIAGNOSIS — I2510 Atherosclerotic heart disease of native coronary artery without angina pectoris: Secondary | ICD-10-CM

## 2015-04-14 DIAGNOSIS — Z95810 Presence of automatic (implantable) cardiac defibrillator: Secondary | ICD-10-CM

## 2015-04-14 LAB — BASIC METABOLIC PANEL
ANION GAP: 10 (ref 5–15)
BUN: 19 mg/dL (ref 6–20)
CHLORIDE: 101 mmol/L (ref 101–111)
CO2: 25 mmol/L (ref 22–32)
Calcium: 8.4 mg/dL — ABNORMAL LOW (ref 8.9–10.3)
Creatinine, Ser: 2.21 mg/dL — ABNORMAL HIGH (ref 0.44–1.00)
GFR calc Af Amer: 26 mL/min — ABNORMAL LOW (ref >60)
GFR calc non Af Amer: 22 mL/min — ABNORMAL LOW (ref >60)
GLUCOSE: 115 mg/dL — AB (ref 65–99)
POTASSIUM: 3.9 mmol/L (ref 3.5–5.1)
Sodium: 136 mmol/L (ref 135–145)

## 2015-04-14 LAB — GLUCOSE, CAPILLARY
Glucose-Capillary: 134 mg/dL — ABNORMAL HIGH (ref 65–99)
Glucose-Capillary: 110 mg/dL — ABNORMAL HIGH (ref 65–99)
Glucose-Capillary: 101 mg/dL — ABNORMAL HIGH (ref 65–99)
Glucose-Capillary: 199 mg/dL — ABNORMAL HIGH (ref 65–99)
Glucose-Capillary: 193 mg/dL — ABNORMAL HIGH (ref 65–99)

## 2015-04-14 LAB — PROCALCITONIN: Procalcitonin: 0.36 ng/mL

## 2015-04-14 LAB — TROPONIN I
TROPONIN I: 0.11 ng/mL — AB (ref <0.031)
Troponin I: 0.14 ng/mL — ABNORMAL HIGH (ref <0.031)
Troponin I: 0.11 ng/mL — ABNORMAL HIGH (ref <0.031)

## 2015-04-14 MED ORDER — ISOSORBIDE MONONITRATE ER 60 MG PO TB24
60.0000 mg | ORAL_TABLET | Freq: Every day | ORAL | Status: DC
  Administered 2015-04-14 – 2015-04-19 (×6): 60 mg via ORAL
  Filled 2015-04-14 (×6): qty 1

## 2015-04-14 MED ORDER — ASPIRIN EC 81 MG PO TBEC
81.0000 mg | DELAYED_RELEASE_TABLET | Freq: Every day | ORAL | Status: DC
  Administered 2015-04-14 – 2015-04-19 (×6): 81 mg via ORAL
  Filled 2015-04-14 (×6): qty 1

## 2015-04-14 MED ORDER — AMIODARONE HCL 200 MG PO TABS
200.0000 mg | ORAL_TABLET | Freq: Two times a day (BID) | ORAL | Status: DC
  Administered 2015-04-14 – 2015-04-19 (×12): 200 mg via ORAL
  Filled 2015-04-14 (×12): qty 1

## 2015-04-14 MED ORDER — ALPRAZOLAM 0.25 MG PO TABS
0.2500 mg | ORAL_TABLET | Freq: Three times a day (TID) | ORAL | Status: DC | PRN
  Administered 2015-04-14 – 2015-04-16 (×5): 0.5 mg via ORAL
  Administered 2015-04-17: 0.25 mg via ORAL
  Administered 2015-04-18 – 2015-04-19 (×2): 0.5 mg via ORAL
  Filled 2015-04-14 (×3): qty 2
  Filled 2015-04-14: qty 1
  Filled 2015-04-14 (×5): qty 2

## 2015-04-14 MED ORDER — ATORVASTATIN CALCIUM 40 MG PO TABS
40.0000 mg | ORAL_TABLET | Freq: Every day | ORAL | Status: DC
  Administered 2015-04-14 – 2015-04-18 (×5): 40 mg via ORAL
  Filled 2015-04-14 (×5): qty 1

## 2015-04-14 MED ORDER — SODIUM CHLORIDE 0.9 % IJ SOLN: 3.0000 mL | INTRAMUSCULAR | Status: DC | PRN

## 2015-04-14 MED ORDER — APIXABAN 5 MG PO TABS
5.0000 mg | ORAL_TABLET | Freq: Two times a day (BID) | ORAL | Status: DC
  Administered 2015-04-14 – 2015-04-19 (×12): 5 mg via ORAL
  Filled 2015-04-14 (×12): qty 1

## 2015-04-14 MED ORDER — PAROXETINE HCL 20 MG PO TABS
10.0000 mg | ORAL_TABLET | Freq: Every day | ORAL | Status: DC
  Administered 2015-04-14 – 2015-04-19 (×6): 10 mg via ORAL
  Filled 2015-04-14 (×6): qty 1

## 2015-04-14 MED ORDER — TECHNETIUM TO 99M ALBUMIN AGGREGATED
4.0000 | Freq: Once | INTRAVENOUS | Status: AC | PRN
  Administered 2015-04-14: 4.1 via INTRAVENOUS

## 2015-04-14 MED ORDER — PROMETHAZINE HCL 12.5 MG PO TABS
25.0000 mg | ORAL_TABLET | Freq: Four times a day (QID) | ORAL | Status: DC | PRN
Start: 2015-04-14 — End: 2015-04-15

## 2015-04-14 MED ORDER — ENSURE ENLIVE PO LIQD
237.0000 mL | Freq: Two times a day (BID) | ORAL | Status: DC
  Administered 2015-04-14: 237 mL via ORAL

## 2015-04-14 MED ORDER — POTASSIUM CHLORIDE CRYS ER 20 MEQ PO TBCR
20.0000 meq | EXTENDED_RELEASE_TABLET | Freq: Every day | ORAL | Status: DC
  Administered 2015-04-14 – 2015-04-19 (×6): 20 meq via ORAL
  Filled 2015-04-14 (×6): qty 1

## 2015-04-14 MED ORDER — TORSEMIDE 20 MG PO TABS
20.0000 mg | ORAL_TABLET | Freq: Every day | ORAL | Status: DC
  Administered 2015-04-14 – 2015-04-19 (×6): 20 mg via ORAL
  Filled 2015-04-14 (×6): qty 1

## 2015-04-14 MED ORDER — SODIUM CHLORIDE 0.9 % IV SOLN: 250.0000 mL | INTRAVENOUS | Status: DC | PRN

## 2015-04-14 MED ORDER — LEVOTHYROXINE SODIUM 50 MCG PO TABS
50.0000 ug | ORAL_TABLET | Freq: Every day | ORAL | Status: DC
  Administered 2015-04-14 – 2015-04-19 (×6): 50 ug via ORAL
  Filled 2015-04-14 (×6): qty 1

## 2015-04-14 MED ORDER — SUCRALFATE 1 GM/10ML PO SUSP
1.0000 g | Freq: Three times a day (TID) | ORAL | Status: DC
  Administered 2015-04-14 – 2015-04-19 (×21): 1 g via ORAL
  Filled 2015-04-14 (×21): qty 10

## 2015-04-14 MED ORDER — PANTOPRAZOLE SODIUM 40 MG PO TBEC
40.0000 mg | DELAYED_RELEASE_TABLET | Freq: Two times a day (BID) | ORAL | Status: DC
  Administered 2015-04-14 – 2015-04-19 (×12): 40 mg via ORAL
  Filled 2015-04-14 (×12): qty 1

## 2015-04-14 MED ORDER — TECHNETIUM TC 99M DIETHYLENETRIAME-PENTAACETIC ACID
30.0000 | Freq: Once | INTRAVENOUS | Status: AC | PRN
  Administered 2015-04-14: 29 via RESPIRATORY_TRACT

## 2015-04-14 MED ORDER — INSULIN ASPART 100 UNIT/ML ~~LOC~~ SOLN
0.0000 [IU] | Freq: Three times a day (TID) | SUBCUTANEOUS | Status: DC
  Administered 2015-04-14: 2 [IU] via SUBCUTANEOUS
  Administered 2015-04-15 (×2): 1 [IU] via SUBCUTANEOUS
  Administered 2015-04-16 (×2): 2 [IU] via SUBCUTANEOUS
  Administered 2015-04-17: 1 [IU] via SUBCUTANEOUS

## 2015-04-14 MED ORDER — OXYCODONE-ACETAMINOPHEN 5-325 MG PO TABS
1.0000 | ORAL_TABLET | Freq: Three times a day (TID) | ORAL | Status: DC | PRN
  Administered 2015-04-14 – 2015-04-19 (×9): 1 via ORAL
  Filled 2015-04-14 (×9): qty 1

## 2015-04-14 MED ORDER — SODIUM CHLORIDE 0.9 % IJ SOLN
3.0000 mL | Freq: Two times a day (BID) | INTRAMUSCULAR | Status: DC
  Administered 2015-04-14 – 2015-04-19 (×10): 3 mL via INTRAVENOUS

## 2015-04-14 MED ORDER — BOOST / RESOURCE BREEZE PO LIQD
1.0000 | Freq: Two times a day (BID) | ORAL | Status: DC
  Administered 2015-04-14 – 2015-04-19 (×6): 1 via ORAL

## 2015-04-14 MED ORDER — TRAMADOL HCL 50 MG PO TABS
50.0000 mg | ORAL_TABLET | Freq: Two times a day (BID) | ORAL | Status: DC | PRN
  Administered 2015-04-16 – 2015-04-19 (×4): 50 mg via ORAL
  Filled 2015-04-14 (×4): qty 1

## 2015-04-14 MED ORDER — ACETAMINOPHEN 325 MG PO TABS: 650.0000 mg | ORAL_TABLET | ORAL | Status: DC | PRN

## 2015-04-14 MED ORDER — FUROSEMIDE 10 MG/ML IJ SOLN
80.0000 mg | Freq: Every day | INTRAMUSCULAR | Status: DC
  Filled 2015-04-14: qty 8

## 2015-04-14 MED ORDER — CARVEDILOL 3.125 MG PO TABS
6.2500 mg | ORAL_TABLET | Freq: Two times a day (BID) | ORAL | Status: DC
  Administered 2015-04-14 – 2015-04-19 (×11): 6.25 mg via ORAL
  Filled 2015-04-14 (×11): qty 2

## 2015-04-14 MED ORDER — ONDANSETRON HCL 4 MG/2ML IJ SOLN
4.0000 mg | Freq: Four times a day (QID) | INTRAMUSCULAR | Status: DC | PRN
  Administered 2015-04-14 – 2015-04-19 (×5): 4 mg via INTRAVENOUS
  Filled 2015-04-14 (×5): qty 2

## 2015-04-14 NOTE — Care Management (Addendum)
Spoke with Karolee Stamps at [facility] concerning patient home health. She stated that patient was supposed to be opened for [facility] with [facility] by Zac[facility] Pontes but that patient was open to [facility]. Was informed that patient then went to [facility] to stay with her daughter Page Spiro and was released only to come back down to this facility and be admitted again. Spoke with Myriam Jacobson RN [place] x 120 at [facility] who stated that she was nurse seeing patient and that she had been doing well with her medication compliance but that she was no able to be seen because she had gone to [place] and that was out of their coverage area.  I discussed case with Dr Reynaldo Minium who was familiar with the patient. Explained that patient did not meet inpatient criteria per interqual review and discussed readmission status. Patient not approved for inpatient. Discussed with attending Dr Romilda Joy who stated that he agrees. Order changed. Attending sated that patient should be ready for discharge tomorrow. MD to write resumption of [place] orders. Asked for CSW to be added.

## 2015-04-14 NOTE — Progress Notes (Addendum)
Initial Nutrition Assessment  DOCUMENTATION CODES:  Severe malnutrition in context of chronic illness, Obesity unspecified   Pt meets criteria for SEVERE MALNUTRITION in the context of Chronic Illness as evidenced by loss of >5 bw in 1 month and an estimated oral intake that met < or equal to 75% of estimated needs for > or equal to 1 month  INTERVENTION:  Boost Breeze po BID, each supplement provides 250 kcal and 9 grams of protein  NUTRITION DIAGNOSIS:  Inadequate oral intake related to acute exacerbations of chronic problems as evidenced by loss of >5% bw in 1 month  GOAL:  Patient will meet greater than or equal to 90% of their needs  MONITOR:  PO intake, Supplement acceptance, Labs  REASON FOR ASSESSMENT:  Malnutrition Screening Tool    ASSESSMENT:  65 y/o female PMHx Ischemic cardiomyopathy, CHF, Lymphoma, CAD, HTN, HLD, DM2, Depression, CKD 3, Jerrye Bushy presents with ongoing SOB. Pt has had multiple admissions this month for guaiac positive stools and blood loss anemia. Workup in reveals potential pulmonary edema.   Pt was in noticeable discomfort on RD arrival. She was SOB and nauseas. Only briefly spoke with. She reports that recently she hasnt been able to eat much because "everything taste bad".This has caused her to lose her appetite.   Patient has also been in and out of the hospital all this month and her recurrent acute illnesses likely have also caused a decrease in appetite/weight. Per meal intake documentation from her admission at the beginning of January, she almost never ate >50% of a meal.   Pt says her UBW in 196 lbs. Per EMR documentation, she weighed ~200 lbs consistently the latter part of 2016  One of the few foods she has been able to eat is cornflakes. She asked for these. She also was agreeable to [employer]. She does not like the regular Ensure.   NFPE: Not attempted due to patients current state  Labs reviewed: Slight hyperglycemia. Decreased renal  function  Diet Order:  Diet Carb Modified Fluid consistency:: Thin; Room service appropriate?: Yes  Skin:  Dry/flaky  Last BM:  1/19  Height:  Ht Readings from Last 1 Encounters:  04/14/15 _0  (1.651 m)   Weight:  Wt Readings from Last 1 Encounters:  04/14/15 184 lb 8 oz (83.689 kg)   Wt Readings from Last 10 Encounters:  04/14/15 184 lb 8 oz (83.689 kg)  04/08/15 190 lb 4.1 oz (86.3 kg)  03/08/15 198 lb 6.6 oz (90 kg)  03/02/15 200 lb 1.6 oz (90.765 kg)  02/28/15 201 lb 14.4 oz (91.581 kg)  12/26/14 194 lb 12.8 oz (88.361 kg)  12/21/14 198 lb 6.4 oz (89.994 kg)  09/12/14 188 lb 12.8 oz (85.639 kg)  08/17/14 192 lb (87.091 kg)  08/04/14 181 lb (82.101 kg)   Ideal Body Weight:  56.82 kg  BMI:  Body mass index is 30.7 kg/(m^2).  Estimated Nutritional Needs:  Kcal:  1600-1800 kcals Protein:  50-67 g Pro Fluid:  Per MD  EDUCATION NEEDS:  No education needs identified at this time  Burtis Junes RD, LDN Nutrition Pager: [phone] 04/14/2015 1:38 PM

## 2015-04-14 NOTE — Care Management (Signed)
Spoke with Myriam Jacobson at [facility] to inform that patient may discharge over the weekend.   Discussed discharge planning,  [facility] is [place] with care and will provide social worker to see patient.   Karolee Stamps at [facility] stated that [facility] was providing the same services that they would under [facility]. Aniticipate discharge to home tomorrow if no changes in condition.

## 2015-04-14 NOTE — Progress Notes (Addendum)
PROGRESS NOTE  [name] Terry [id number] DOB: 27-Nov-1950 DOA: 04/13/2015 PCP: Pcp Not In System Brief History 65 year old female with a history of diabetes mellitus type 2 ischemic cardiomyopathy (EF 35-40 percent) hypertension, hyperlipidemia, coronary artery disease, STEMI in November 2016-treated medically at [facility] with a cardiac catheterization showing 100% stenosis of the proximal PDA, ventricular tachycardia with ICD, CKD stage 3-4, paroxysmal atrial fibrillation, depression presented with chest discomfort and shortness of breath that started 4 PM on 04/13/2015. Apparently, the patient was recently discharged from [facility] on 04/13/2015. Unfortunately, prior records are not available at this time. The patient's discharge instructions revealed that she was admitted for abdominal pain and guaiac positive stools. However, upon discharge the patient was resumed on her apixiban and aspirin. Notably, the patient was recently discharged from [facility] on 04/08/15 after treatment for blood loss anemia. At that time, she was seen by [facility] GI. Colonoscopy on 04/07/2015 showed internal hemorrhoids and diverticulosis. EGD on 04/03/2015 was unremarkable. In the emergency department, the patient was hypoxemic with oxygen saturation 80% on room air. Chest x-ray showed bilateral patchy opacities concerning for pulmonary edema. The patient was given furosemide 80 mg IV in the emergency department. Assessment/Plan: Acute respiratory failure with hypoxia -Presently stable on room air with oxygen saturation 99-100 percent Acute on chronic systolic CHF -She appears to be clinically euvolemic today -Restart home dose of torsemide -daily weights -Continue carvedilol -CT chest to clarify infiltrates as pt is not floridly fluid overloaded -check procalcitonin Atypical chest pain with elevated troponin -Troponin trend is flat -Likely represents demand ischemia in the  setting of decompensated CHF and CKD -No concerning ischemic changes on EKG Paroxysmal atrial fibrillation -Presently in sinus -Continue amiodarone -CHADSVASc 5 -Continue carvedilol  -continue apixaban Diabetes mellitus type 2 -04/10/15 hemoglobin A1c 5.8 -Novlog slding scale Hypertension  -Well-controlled  Hyperlipidemia -Continue statin judiciously as the patient's LFTs are mildly elevated -Repeat LFTs in a.m. -If continues to trend up, may need to discontinue statin  CKD stage III-4  -Baseline creatinine 2.2-2.5  -Monitor with diuresis  Lactic acidosis -The patient does not appear clinically septic -Patient is afebrile and hemodynamically stable -procalcitonin Hypothyroidism  -Continue synthroid Recent GI bleed  -Patient was discharged from [facility] 04/08/15 -At that time, the decision was made to continue apixaban and aspirin' Plavix was held as the patient's stents have been greater than 34-year-old  -Baseline hemoglobin approximately 10 GERD  -Continue maintenance Protonix and Carafate  Anxiety  -Continue Paxil and alprazolam  Recent DVT right peroneal vein  -During that admission on 04/01/2015 IR evaluated case on 1/7 and recommended holding off IVC filter for small DVT. Repeat Doppler with DVT resolved -Continue apixaban   Family Communication:   Pt at beside--total time 65 min--9AM-1010A Disposition Plan:   Home 1/21 if stable        Procedures/Studies: Ct Abdomen Pelvis Wo Contrast  03/31/2015  CLINICAL DATA:  Black stools since Christmas. Intermittent aching epigastric pain starting after Christmas, worsened with eating. Guaiac positive stools at the PCP office reportedly. Associated symptoms of shortness of breath, intermittent chest pain, hemoptysis and anxiety. EXAM: CT ABDOMEN AND PELVIS WITHOUT CONTRAST TECHNIQUE: Multidetector CT imaging of the abdomen and pelvis was performed following the standard protocol without IV contrast. COMPARISON:  CT abdomen  and pelvis dated 03/10/2015. FINDINGS: The lung bases are now clear. Interval resolution of the small right pleural effusion seen on earlier CT. Cardiomegaly appears  grossly stable, incompletely imaged. Bowel is normal in caliber. Scattered diverticulosis noted throughout the descending and sigmoid colon without evidence of acute diverticulitis. No bowel wall thickening or evidence of bowel wall inflammation identified. Appendix is normal. Stomach is unremarkable. No free fluid or abscess collection identified. No free intraperitoneal air. Liver, spleen, pancreas, gallbladder, and adrenal glands are within normal limits for a noncontrast study. Bilateral renal cysts again noted, difficult to definitively characterize without intravascular contrast, but stable compared to multiple prior studies. Left kidney is somewhat atrophic, stable. No renal stone or hydronephrosis. No ureteral or bladder calculi identified. Adrenal glands are unremarkable. Heavy atherosclerotic changes are seen along the walls of the normal-caliber abdominal aorta and branch vessels. Bilateral renal artery stents in place. Adnexal regions are unremarkable. Scattered degenerative changes are seen throughout the thoracolumbar spine but no acute osseous abnormality. Superficial soft tissues are unremarkable. IMPRESSION: 1. Colonic diverticulosis without evidence of acute diverticulitis. 2. Overall, no evidence of acute intra-abdominal or intrapelvic abnormality identified. Chronic/incidental findings detailed above, including extensive atherosclerotic changes. Electronically Signed   By: Franki Cabot M.D.   On: 03/31/2015 17:50   Dg Chest Portable 1 View  04/13/2015  CLINICAL DATA:  Acute onset of central chest pain and shortness of breath. Generalized abdominal pain and nausea. Initial encounter. EXAM: PORTABLE CHEST 1 VIEW COMPARISON:  Chest radiograph from 03/31/2015 FINDINGS: The lungs are well-aerated. Vascular congestion is noted. Patchy  bilateral airspace opacities may reflect pulmonary edema or possibly pneumonia. No pleural effusion or pneumothorax is seen. The cardiomediastinal silhouette is mildly enlarged. A pacemaker/AICD is noted overlying the left chest wall, with leads ending overlying the right atrium and right ventricle. No acute osseous abnormalities are seen. IMPRESSION: Vascular congestion and mild cardiomegaly. Patchy bilateral airspace opacities may reflect pulmonary edema or possibly pneumonia. Electronically Signed   By: Garald Balding M.D.   On: 04/13/2015 22:13   Dg Chest Port 1 View  03/31/2015  CLINICAL DATA:  Sudden onset of upper abdominal and chest pain. Nausea. EXAM: PORTABLE CHEST 1 VIEW COMPARISON:  03/08/2015 FINDINGS: Dual lead left-sided pacemaker remains in place. Cardiomegaly is again seen, unchanged. Improved bilateral opacities from prior exam without new confluent airspace disease. Mild vascular congestion. No large pleural effusion or pneumothorax. IMPRESSION: Improved aeration from prior exam with near complete resolution of previous bilateral opacities. Mild vascular congestion and stable cardiomegaly. Electronically Signed   By: Jeb Levering M.D.   On: 03/31/2015 02:48   Dg Abd Portable 1v  03/31/2015  CLINICAL DATA:  Acute onset of severe upper abdominal pain and generalized chest pain. Nausea. Initial encounter. EXAM: PORTABLE ABDOMEN - 1 VIEW COMPARISON:  CT of the abdomen and pelvis performed 03/10/2015 FINDINGS: The visualized bowel gas pattern is unremarkable. Scattered air and stool filled loops of colon are seen; no abnormal dilatation of small bowel loops is seen to suggest small bowel obstruction. No free intra-abdominal air is identified, though evaluation for free air is limited on a single supine view. Mild degenerative change is noted at the lower lumbar spine; the sacroiliac joints are unremarkable in appearance. The visualized lung bases are essentially clear. Pacemaker/AICD leads are  partially imaged. IMPRESSION: Unremarkable bowel gas pattern; no free intra-abdominal air seen. Small amount of stool noted in the colon. Electronically Signed   By: Garald Balding M.D.   On: 03/31/2015 02:46         Subjective: Patient denies fevers, chills, headache, chest pain, dyspnea, nausea, vomiting, diarrhea, abdominal pain, dysuria, hematuria  Objective: Filed Vitals:   04/14/15 0000 04/14/15 0030 04/14/15 0059 04/14/15 0612  BP: 130/104 140/96 138/85 131/87  Pulse: 101 100 97 74  Temp:   98.8 F (37.1 C) 98.3 F (36.8 C)  TempSrc:   Oral Oral  Resp: 28 18 16 21   Height:   5\' 5"  (1.651 m)   Weight:   83.689 kg (184 lb 8 oz)   SpO2: 95% 95% 99% 99%    Intake/Output Summary (Last 24 hours) at 04/14/15 0935 Last data filed at 04/14/15 [id number]  Gross per 24 hour  Intake      0 ml  Output    750 ml  Net   -750 ml   Weight change:  Exam:   General:  Pt is alert, follows commands appropriately, not in acute distress  HEENT: No icterus, No thrush, No neck mass, /AT  Cardiovascular: RRR, S1/S2, no rubs, no gallops  Respiratory: Bibasilar crackles. No wheeze. Good air movement   Abdomen: Soft/+BS, non tender, non distended, no guarding; no hepatosplenomegaly   Extremities: No edema, No lymphangitis, No petechiae, No rashes, no synovitis  Data Reviewed: Basic Metabolic Panel:  Recent Labs Lab 04/08/15 0603 04/13/15 2236  NA 138 135  K 4.0 5.3*  CL 104 98*  CO2 23  --   GLUCOSE 104* 127*  BUN 29* 24*  CREATININE 2.90* 2.20*  CALCIUM 8.5*  --    Liver Function Tests:  Recent Labs Lab 04/13/15 2215  AST 121*  ALT 106*  ALKPHOS 86  BILITOT 1.2  PROT 7.8  ALBUMIN 3.3*   No results for input(s): LIPASE, AMYLASE in the last 168 hours. No results for input(s): AMMONIA in the last 168 hours. CBC:  Recent Labs Lab 04/08/15 0603 04/13/15 2215 04/13/15 2236  WBC 3.8* 6.2  --   NEUTROABS  --  4.2  --   HGB 10.4* 12.7 13.3  HCT 30.3* 37.1 39.0   MCV 99.0 97.1  --   PLT 196 228  --    Cardiac Enzymes:  Recent Labs Lab 04/14/15 0232 04/14/15 0833  TROPONINI 0.11* 0.14*   BNP: Invalid input(s): POCBNP CBG:  Recent Labs Lab 04/07/15 2242 04/08/15 0755 04/08/15 1233 04/14/15 0117 04/14/15 0754  GLUCAP 122* 80 108* 134* 110*    No results found for this or any previous visit (from the past 240 hour(s)).   Scheduled Meds: . amiodarone  200 mg Oral BID  . apixaban  5 mg Oral BID  . aspirin EC  81 mg Oral Daily  . atorvastatin  40 mg Oral q1800  . carvedilol  6.25 mg Oral BID WC  . feeding supplement (ENSURE ENLIVE)  237 mL Oral BID BM  . furosemide  80 mg Intravenous Daily  . insulin aspart  0-9 Units Subcutaneous TID WC  . isosorbide mononitrate  60 mg Oral Daily  . levothyroxine  50 mcg Oral QAC breakfast  . pantoprazole  40 mg Oral BID  . PARoxetine  10 mg Oral Daily  . potassium chloride SA  20 mEq Oral Daily  . sodium chloride  3 mL Intravenous Q12H  . sucralfate  1 g Oral TID WC & HS   Continuous Infusions:    [name] Bevel, DO  [facility] Pager [phone]  If 7PM-7AM, please contact night-coverage [email or web address] Password [email or web address] 04/14/2015, 9:35 AM   LOS: 1 day

## 2015-04-14 NOTE — H&P (Signed)
PCP:   Pcp Not In System   Chief Complaint:  Sob, chest tight  HPI: 65 yo female h/o recent GIB discharged yesterday, DVT, ICM EF30%, AICD for vtach, comes in with sob and chest pain today that has waxed and waned.  Pt denies any fevers or cough.  No swelling.  Pt anxious.  She was just d/c yesterday still on eloquis.  Pt reports she is taking her medications.  She did get 2 units prbcs transfused with lasix between units.  She got very sob today and on arrival to ED oxygen sats were 80% on RA.  Pt not on supplemental oxygen at home.  Her pain is better now and her sob is better with 2 liters of oxygen on.  Pt referred for admission for chf exacerbation.    Review of Systems:  Positive and negative as per HPI otherwise all other systems are negative  Past Medical History: Past Medical History  Diagnosis Date  . Ischemic cardiomyopathy     a. s/p MDT dual chamber ICD implanted 2013 by Dr Westley Gambles at [facility], now followed by Dr Rayann Heman. b. LVEF 30-35% by echo 01/2015 at [facility] per report; 35-40% by echo 01/2015 at [facility].  Marland Kitchen History of stroke      a. 1993  . Coronary artery disease     a. s/p previous interventions in [place] per patient, no records available. b. cath 04/2013 with non-obstructive disease. c. STEMI 01/2015 @ [place] - cardiac cath had shown 100% stenosis of prox PDA, patent stent in RCA with otherwise normal coronary arteries.  . Lymphoma ([place])     a. s/p chemo/radiation 2009  . Chronic systolic CHF (congestive heart failure) ([place])   . HTN (hypertension)   . HLD (hyperlipidemia)   . Ventricular tachycardia ([place])     a. CL 300 msec requiring ICD shocks therpay 5/14 b. recurrent VT 04/2014, placed on amiodarone c. recurrent VT 05/2014 s/p ablation by Dr Rayann Heman d. s/p repeat ablation 07/2014  . DM2 (diabetes mellitus, type 2), newly diagnosed    . CKD (chronic kidney disease) stage 3, GFR 30-59 ml/min    . Polymyalgia rheumatica ([place])   . Depression   . GERD (gastroesophageal  reflux disease)   . Renal atrophy, left     a. [place] 02/2015: "Left renal atrophy, likely from chronic renal arterial stenosis"  . Mitral regurgitation     a. mod by echo 01/2015.  . Diverticulosis     a. By CT 02/2015.  Marland Kitchen PAF (paroxysmal atrial fibrillation) ([place])     a. Brief paroxysms during admission 02/2015 - amiodarone increased. Not placed on anticoag due to dual antiplatelet therapy and bleeding risk including in the setting of possible GI pathology.   . Hiatal hernia     a. by EGD 06/2014.  Marland Kitchen Gastritis     a. by EGD 06/2014.  Marland Kitchen Anxiousness 03/02/2015   Past Surgical History  Procedure Laterality Date  . Cardiac defibrillator placement  03/2011    MDT ICD implanted at [facility] by Dr Tana Coast  . Coronary angioplasty with stent placement    . Left and right heart catheterization with coronary angiogram N/A 05/03/2013    non-obstructive CAD  . V-tach ablation N/A 05/26/2014    VT ablation by Dr Rayann Heman - PVCs arising from the inferolateral LV, extensive substrate ablation  . Electrophysiologic study N/A 08/04/2014    Procedure: V Tach Ablation;  Surgeon: Thompson Grayer, MD;  Location: [facility];  Service: Cardiovascular;  Laterality: N/A;  . Esophagogastroduodenoscopy (egd) with propofol N/A 04/03/2015    Procedure: ESOPHAGOGASTRODUODENOSCOPY (EGD) WITH PROPOFOL;  Surgeon: Laurence Spates, MD;  Location: [facility];  Service: Endoscopy;  Laterality: N/A;  . Colonoscopy N/A 04/07/2015    Procedure: COLONOSCOPY;  Surgeon: Laurence Spates, MD;  Location: [facility];  Service: Endoscopy;  Laterality: N/A;    Medications: Prior to Admission medications   Medication Sig Start Date End Date Taking? Authorizing Provider  ALPRAZolam (XANAX) 0.25 MG tablet Take 1-2 tablets (0.25-0.5 mg total) by mouth 3 (three) times daily as needed for anxiety. 03/02/15   Rexene Alberts, MD  amiodarone (PACERONE) 200 MG tablet Take 1 tablet (200 mg total) by mouth 2 (two) times daily. 02/28/15   Dayna N Dunn, PA-C   apixaban (ELIQUIS) 5 MG TABS tablet Take 5 mg by mouth 2 (two) times daily.    Historical Provider, MD  aspirin EC 81 MG tablet Take 81 mg by mouth daily.    Historical Provider, MD  atorvastatin (LIPITOR) 40 MG tablet Take 1 tablet (40 mg total) by mouth daily at 6 PM. 06/20/14   Thompson Grayer, MD  carvedilol (COREG) 6.25 MG tablet Take 1 tablet (6.25 mg total) by mouth 2 (two) times daily with a meal. 04/08/15   Costin Karlyne Greenspan, MD  GLIPIZIDE XL 2.5 MG 24 hr tablet Take 2.5 mg by mouth daily. 03/21/15   Historical Provider, MD  isosorbide mononitrate (IMDUR) 60 MG 24 hr tablet Take 1 tablet (60 mg total) by mouth daily. 03/12/15   Allie Bossier, MD  levothyroxine (SYNTHROID, LEVOTHROID) 50 MCG tablet Take 1 tablet (50 mcg total) by mouth daily before breakfast. 03/12/15   Allie Bossier, MD  Multiple Vitamin (MULTI VITAMIN DAILY PO) Take 1 tablet by mouth daily.    Historical Provider, MD  nitroGLYCERIN (NITROSTAT) 0.4 MG SL tablet Place 1 tablet (0.4 mg total) under the tongue every 5 (five) minutes as needed for chest pain. 07/04/14   Amber Sena Slate, NP  ondansetron (ZOFRAN) 4 MG tablet Take 4 mg by mouth every 6 (six) hours as needed. Nausea 03/18/15   Historical Provider, MD  oxyCODONE-acetaminophen (PERCOCET/ROXICET) 5-325 MG tablet Take 1 tablet by mouth every 8 (eight) hours as needed for severe pain. 04/08/15   Costin Karlyne Greenspan, MD  pantoprazole (PROTONIX) 40 MG tablet Take 1 tablet (40 mg total) by mouth 2 (two) times daily. 02/28/15   Dayna N Dunn, PA-C  PARoxetine (PAXIL) 10 MG tablet Take 1 tablet (10 mg total) by mouth daily. 03/12/15   Allie Bossier, MD  potassium chloride SA (K-DUR,KLOR-CON) 20 MEQ tablet Take 20 mEq by mouth daily.    Historical Provider, MD  promethazine (PHENERGAN) 25 MG tablet Take 25 mg by mouth every 6 (six) hours as needed for nausea or vomiting.    Historical Provider, MD  sucralfate (CARAFATE) 1 GM/10ML suspension Take 10 mLs (1 g total) by mouth 4 (four)  times daily -  with meals and at bedtime. 02/28/15   Dayna N Dunn, PA-C  torsemide (DEMADEX) 20 MG tablet Take 1 tablet (20 mg total) by mouth daily. 03/12/15   Allie Bossier, MD  traMADol (ULTRAM) 50 MG tablet Take 1 tablet (50 mg total) by mouth every 12 (twelve) hours as needed for moderate pain or severe pain. 02/28/15   Dayna N Dunn, PA-C    Allergies:   Allergies  Allergen Reactions  . Flagyl [Metronidazole] Swelling and Rash    Face swells   .  Contrast Media [Iodinated Diagnostic Agents] Rash  . Ioxaglate Rash  . Potassium Sulfate Rash and Other (See Comments)    Headaches  . Potassium-Containing Compounds Other (See Comments)    Headaches-- reports no problems now    Social History:  reports that she quit smoking about 10 months ago. She has never used smokeless tobacco. She reports that she does not drink alcohol or use illicit drugs.  Family History: Family History  Problem Relation Age of Onset  . Diabetes Father   . Hypertension Father   . Heart disease Father   . Alcoholism Father   . Hypertension Mother   . Heart disease Mother   . Breast cancer Mother   . Stroke Mother   . Diabetes Brother   . Diabetes Brother   . Diabetes Brother   . Diabetes Brother   . Hypertension Sister   . Heart disease Sister   . Alcoholism Sister   . Thyroid disease Sister   . Heart attack Sister   . Heart attack Brother   . Stroke Brother     Physical Exam: Filed Vitals:   04/13/15 2212 04/13/15 2230 04/13/15 2300 04/13/15 2339  BP:  155/111 149/52 155/104  Pulse:  111 108 104  Temp:      TempSrc:      Resp:  28 21 24   Weight:      SpO2: 96% 91% 94% 97%   General appearance: alert, cooperative and no distress Head: Normocephalic, without obvious abnormality, atraumatic Eyes: negative Nose: Nares normal. Septum midline. Mucosa normal. No drainage or sinus tenderness. Neck: no JVD and supple, symmetrical, trachea midline Lungs: clear to auscultation  bilaterally Heart: regular rate and rhythm, S1, S2 normal, no murmur, click, rub or gallop Abdomen: soft, non-tender; bowel sounds normal; no masses,  no organomegaly Extremities: extremities normal, atraumatic, no cyanosis or edema Pulses: 2+ and symmetric Skin: Skin color, texture, turgor normal. No rashes or lesions Neurologic: Grossly normal    Labs on Admission:   Recent Labs  04/13/15 2236  NA 135  K 5.3*  CL 98*  GLUCOSE 127*  BUN 24*  CREATININE 2.20*    Recent Labs  04/13/15 2215  AST 121*  ALT 106*  ALKPHOS 86  BILITOT 1.2  PROT 7.8  ALBUMIN 3.3*    Recent Labs  04/13/15 2215 04/13/15 2236  WBC 6.2  --   NEUTROABS 4.2  --   HGB 12.7 13.3  HCT 37.1 39.0  MCV 97.1  --   PLT 228  --     Radiological Exams on Admission: Dg Chest Portable 1 View  04/13/2015  CLINICAL DATA:  Acute onset of central chest pain and shortness of breath. Generalized abdominal pain and nausea. Initial encounter. EXAM: PORTABLE CHEST 1 VIEW COMPARISON:  Chest radiograph from 03/31/2015 FINDINGS: The lungs are well-aerated. Vascular congestion is noted. Patchy bilateral airspace opacities may reflect pulmonary edema or possibly pneumonia. No pleural effusion or pneumothorax is seen. The cardiomediastinal silhouette is mildly enlarged. A pacemaker/AICD is noted overlying the left chest wall, with leads ending overlying the right atrium and right ventricle. No acute osseous abnormalities are seen. IMPRESSION: Vascular congestion and mild cardiomegaly. Patchy bilateral airspace opacities may reflect pulmonary edema or possibly pneumonia. Electronically Signed   By: Garald Balding M.D.   On: 04/13/2015 22:13   Old chart reviewed cxr with edema no infiltrate Case discussed with dr zammit  Assessment/Plan  65 yo female with acute hypoxic respiratory failure  Principal Problem:  Acute on chronic systolic (congestive) heart failure ([place])- most likely, will place on lasix 80mg  iv daily  (need to assess this dosage daily), will not repeat echo as she just had one in last couple of months.  chf pathway  Active Problems:   History of stroke- noted   AICD (automatic cardioverter/defibrillator) present, dual chamber medtronic- noted   Cardiomyopathy, ischemic-EF 35-40%- noted   CAD S/P prior RCA PCI, cath 05/03/2013- med Rx, STEMI 01/2015 - 100% prox PDA, treated medically   CKD (chronic kidney disease) stage 3, GFR 30-59 ml/min- noted, at baseline   Acute respiratory failure with hypoxia (HCC)   Anxiousness Recent DVT on eloquis-  Cannot be sure not PE, pt taking eloquis, will order vq in am due to CKD Recent GIB-noted, will hold off on full dose of lovenox for now , pt is really unclear as to when she had last eloquis dose  Admit to monitored bed.  Full code.  Zanai Mallari A 04/14/2015, 12:03 AM

## 2015-04-15 LAB — BASIC METABOLIC PANEL
Anion gap: 7 (ref 5–15)
BUN: 26 mg/dL — AB (ref 6–20)
CO2: 28 mmol/L (ref 22–32)
Calcium: 8.5 mg/dL — ABNORMAL LOW (ref 8.9–10.3)
Chloride: 100 mmol/L — ABNORMAL LOW (ref 101–111)
Creatinine, Ser: 2.78 mg/dL — ABNORMAL HIGH (ref 0.44–1.00)
GFR calc Af Amer: 20 mL/min — ABNORMAL LOW (ref >60)
GFR, EST NON AFRICAN AMERICAN: 17 mL/min — AB (ref >60)
GLUCOSE: 101 mg/dL — AB (ref 65–99)
Potassium: 3.6 mmol/L (ref 3.5–5.1)
Sodium: 135 mmol/L (ref 135–145)

## 2015-04-15 LAB — GLUCOSE, CAPILLARY
GLUCOSE-CAPILLARY: 138 mg/dL — AB (ref 65–99)
GLUCOSE-CAPILLARY: 84 mg/dL (ref 65–99)
Glucose-Capillary: 147 mg/dL — ABNORMAL HIGH (ref 65–99)
Glucose-Capillary: 111 mg/dL — ABNORMAL HIGH (ref 65–99)

## 2015-04-15 LAB — CBC
HEMATOCRIT: 29.5 % — AB (ref 36.0–46.0)
HEMOGLOBIN: 10.5 g/dL — AB (ref 12.0–15.0)
MCH: 35 pg — ABNORMAL HIGH (ref 26.0–34.0)
MCHC: 35.6 g/dL (ref 30.0–36.0)
MCV: 98.3 fL (ref 78.0–100.0)
Platelets: 196 10*3/uL (ref 150–400)
RBC: 3 MIL/uL — ABNORMAL LOW (ref 3.87–5.11)
RDW: 19.2 % — AB (ref 11.5–15.5)
WBC: 3.8 10*3/uL — AB (ref 4.0–10.5)

## 2015-04-15 MED ORDER — PROMETHAZINE HCL 25 MG RE SUPP
25.0000 mg | Freq: Four times a day (QID) | RECTAL | Status: DC | PRN
  Administered 2015-04-15 – 2015-04-19 (×2): 25 mg via RECTAL
  Filled 2015-04-15 (×2): qty 1

## 2015-04-15 MED ORDER — CEFTRIAXONE SODIUM 1 G IJ SOLR
1.0000 g | INTRAMUSCULAR | Status: DC
  Administered 2015-04-15 – 2015-04-19 (×5): 1 g via INTRAVENOUS
  Filled 2015-04-15 (×5): qty 10

## 2015-04-15 MED ORDER — AZITHROMYCIN 500 MG IV SOLR
500.0000 mg | INTRAVENOUS | Status: DC
  Administered 2015-04-15 – 2015-04-19 (×5): 500 mg via INTRAVENOUS
  Filled 2015-04-15 (×5): qty 500

## 2015-04-15 NOTE — Progress Notes (Addendum)
SATURATION QUALIFICATIONS:  Patient Saturations on Room Air at Rest = 100%  Patient Saturations on Room Air while Ambulating = 85%  Patient Saturations on 2 Liters of oxygen while Ambulating = 93%

## 2015-04-15 NOTE — Progress Notes (Signed)
PROGRESS NOTE  [name] Terry [id number] DOB: [birth year] DOA: 04/13/2015 PCP: Pcp Not In System  Summary: 30 yof PMHx including STEMI 01/2015, diabetes, ischemic cardiomyopathy who was recently admitted 02/2015 for multiple reasons including abdominal pain nausea vomiting, that time thought to be secondary to abrupt stop of benzodiazepine, noncompliance with medication. At that time CT abdomen and pelvis was negative. She was admitted for 9 days in January, discharged 1/14 for GI bleed, saw GI at that time underwent EGD 1/9 without acute findings and a colonoscopy 1/13 without acute findings. She was restarted on Eliquis on discharge. She did require 2 units packed blood cells at that time. According to the chart she was discharged from [facility] 1/20 and went immediately to [facility] with complaints of shortness of breath, chest pain, abdominal pain, nausea, vomiting. She was admitted for acute on chronic systolic congestive heart failure as well as acute hypoxic respiratory failure.  Assessment/Plan: 1. Acute respiratory failure with hypoxia. Suspect secondary to CHF. Possibly complicated by pneumonia. Currently she will require oxygen on discharge. 2. Acute on chronic systolic congestive heart failure. Appears clinically resolved. No clinical evidence of volume overload at this time. Continues on oral Demadex. 3. Atypical chest pain with mild troponin elevation, flat, seen to be elevated recently as well. EKG nonacute. Thought to be demand ischemia in the setting of CHF and chronic kidney disease. She is a history of coronary artery disease with plans for medical management regardless. 4. Suspected pneumonia, productive cough. She is afebrile with a normal white blood cell count. 5. Reported abdominal pain. None currently. She's had extensive investigation including recent EGD, colonoscopy as well as 3 CT scans within the last 2 months. Monitor clinically. 6. Paroxysmal atrial fibrillation,  currently in NSR. CHADSVASc score of 5, continue amiodarone, carvedilol, and Eliquis 7. DM type 2 with neuropathy, stable. Hgb A1C 5.8 8. HTN, stable. Continue home meds. 9. CKD stage IV, stable. Creatinine at baseline. Continue to monitor in the setting of diuresis. 10. Hypothyroidism, continue synthroid. 11. Normocytic anemia. No evidence of bleeding at this point. No history of recurrent GI bleed. Appears to be at baseline. 12. GERD, continue PPI. 61. PMH STEMI 01/2015 med management at [facility]; ischemic cardiomyopathy, chronic systolic congestive heart failure LVEF 35-40 percent; dual-chamber ICD, lymphoma 2009 14. 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 This might of enlarged over the past 10 months. The patient's renal insufficiency does not allow for contrast-enhanced MRI. Consider non-contrast-enhanced MRI assessment of the pancreas for further characterization as an outpatient.   She appears nontoxic and currently has no pain although she has vomiting and unable to tolerate oral nutrition. She is also hypoxic which is new and I suspect she may be developing pneumonia.  I suspect anxiety is playing a role in regard to her pain. She has been hospitalized 3 different hospitals with the last month.  Start antibiotics. Monitor clinically.  Code Status: Full DVT prophylaxis: Eliquis Family Communication: No family at bedside. Disposition Plan: Discharge home today.   Murray Hodgkins, MD  [facility]  Pager [phone] If 7PM-7AM, please contact night-coverage at [email or web address], password [facility] 04/15/2015, 7:54 AM  LOS: 2 days   Consultants:    Procedures:    Antibiotics:    HPI/Subjective:  Has nausea and vomiting since eating breakfast. Reports 4 episodes of emesis. No significant abdominal pain at this point. No chest pain or shortness of breath.  Objective: Filed  Vitals:    04/14/15 1415 04/14/15 2054 04/14/15 2108 04/15/15 0632  BP: 113/73  106/71 118/78  Pulse: 74 53 60 67  Temp: 98.5 F (36.9 C)  97.7 F (36.5 C) 97.8 F (36.6 C)  TempSrc: Oral  Oral Oral  Resp: 20 20 18 15   Height:      Weight:    83.961 kg (185 lb 1.6 oz)  SpO2: 100% 100% 100% 100%    Intake/Output Summary (Last 24 hours) at 04/15/15 0754 Last data filed at 04/15/15 [id number]  Gross per 24 hour  Intake    967 ml  Output    875 ml  Net     92 ml     Filed Weights   04/13/15 2143 04/14/15 0059 04/15/15 [id number]  Weight: 86.183 kg (190 lb) 83.689 kg (184 lb 8 oz) 83.961 kg (185 lb 1.6 oz)    Exam:     VSS, afebrile General: Appears quite anxious, hyperventilating at times; settles down with distraction. Eyes: PERRL, normal lids, irises  ENT: grossly normal hearing, lips & tongue Cardiovascular: RRR, no m/r/g. No LE edema. Telemetry: SR, no arrhythmias  Respiratory: CTA bilaterally, no w/r/r. Normal respiratory effort. Abdomen: soft, non-distended. No abdominal scars noted. Non-tender on deep palpation.  Psychiatric: Anxious, speech fluent and appropriate Neurologic: grossly non-focal.  New data reviewed:  BMP stable- consistent with CKD  Hgb down to 10.5 appears to be a baseline  Pertinent data since admission:  BNP 894  Lactic acid 2.88  Troponin 0.11   Scheduled Meds: . amiodarone  200 mg Oral BID  . apixaban  5 mg Oral BID  . aspirin EC  81 mg Oral Daily  . atorvastatin  40 mg Oral q1800  . carvedilol  6.25 mg Oral BID WC  . feeding supplement  1 Container Oral BID BM  . insulin aspart  0-9 Units Subcutaneous TID WC  . isosorbide mononitrate  60 mg Oral Daily  . levothyroxine  50 mcg Oral QAC breakfast  . pantoprazole  40 mg Oral BID  . PARoxetine  10 mg Oral Daily  . potassium chloride SA  20 mEq Oral Daily  . sodium chloride  3 mL Intravenous Q12H  . sucralfate  1 g Oral TID WC & HS  . torsemide  20 mg Oral Daily   Continuous Infusions:    Principal Problem:   Acute on chronic systolic (congestive) heart failure (HCC) Active Problems:   History of stroke   AICD (automatic cardioverter/defibrillator) present, dual chamber medtronic   Cardiomyopathy, ischemic-EF 35-40%   CAD S/P prior RCA PCI, cath 05/03/2013- med Rx, STEMI 01/2015 - 100% prox PDA, treated medically   CKD (chronic kidney disease) stage 3, GFR 30-59 ml/min   Acute respiratory failure with hypoxia (HCC)   Anxiousness   COPD (chronic obstructive pulmonary disease) ([place])  Time Spent: 20 minutes  By signing my name below, I, Rosalie Doctor attest that this documentation has been prepared under the direction and in the presence of Murray Hodgkins, MD Electronically signed: Rosalie Doctor, Scribe.  04/15/2015 11:25am  I personally performed the services described in this documentation. All medical record entries made by the scribe were at my direction. I have reviewed the chart and agree that the record reflects my personal performance and is accurate and complete. Murray Hodgkins, MD

## 2015-04-16 LAB — BASIC METABOLIC PANEL
Anion gap: 9 (ref 5–15)
BUN: 30 mg/dL — AB (ref 6–20)
CHLORIDE: 100 mmol/L — AB (ref 101–111)
CO2: 26 mmol/L (ref 22–32)
CREATININE: 2.99 mg/dL — AB (ref 0.44–1.00)
Calcium: 8.4 mg/dL — ABNORMAL LOW (ref 8.9–10.3)
GFR calc Af Amer: 18 mL/min — ABNORMAL LOW (ref >60)
GFR calc non Af Amer: 16 mL/min — ABNORMAL LOW (ref >60)
Glucose, Bld: 72 mg/dL (ref 65–99)
Potassium: 3.7 mmol/L (ref 3.5–5.1)
Sodium: 135 mmol/L (ref 135–145)

## 2015-04-16 LAB — GLUCOSE, CAPILLARY
GLUCOSE-CAPILLARY: 81 mg/dL (ref 65–99)
GLUCOSE-CAPILLARY: 151 mg/dL — AB (ref 65–99)
Glucose-Capillary: 164 mg/dL — ABNORMAL HIGH (ref 65–99)
Glucose-Capillary: 121 mg/dL — ABNORMAL HIGH (ref 65–99)

## 2015-04-16 LAB — PROCALCITONIN: Procalcitonin: 0.38 ng/mL

## 2015-04-16 MED ORDER — MORPHINE SULFATE (PF) 2 MG/ML IV SOLN
1.0000 mg | Freq: Once | INTRAVENOUS | Status: AC
  Administered 2015-04-17: 1 mg via INTRAVENOUS
  Filled 2015-04-16: qty 1

## 2015-04-16 NOTE — Progress Notes (Signed)
Notified NP, pt complaining of pain in her abdomen. Pt states that it feels like a knot is twisting in her abdomen. Awaiting response.

## 2015-04-16 NOTE — Progress Notes (Signed)
PROGRESS NOTE  [name] Terry [id number] DOB: 20-Jul-1950 DOA: 04/13/2015 PCP: Pcp Not In System  Summary: 31 yof PMHx including STEMI 01/2015, diabetes, ischemic cardiomyopathy who was recently admitted 02/2015 for multiple reasons including abdominal pain nausea vomiting, that time thought to be secondary to abrupt stop of benzodiazepine, noncompliance with medication. At that time CT abdomen and pelvis was negative. She was admitted for 9 days in January, discharged 1/14 for GI bleed, saw GI at that time underwent EGD 1/9 without acute findings and a colonoscopy 1/13 without acute findings. She was restarted on Eliquis on discharge. She did require 2 units packed blood cells at that time. According to the chart she was discharged from [facility] 1/20 and went immediately to [facility] with complaints of shortness of breath, chest pain, abdominal pain, nausea, vomiting. She was admitted for acute on chronic systolic congestive heart failure as well as acute hypoxic respiratory failure.  Assessment/Plan: 1. Acute respiratory failure with hypoxia. Under percent on room air today. We'll check again tomorrow with ambulation. 2. Acute on chronic systolic congestive heart failure. Resolved. Continue oral Demadex. 3. Atypical chest pain with mild troponin elevation. EKG nonacute. Suspect demand ischemia in the setting of CHF and chronic kidney disease. No further evaluation suggested. 4. Pneumonia. Afebrile, no leukocytosis. Improving clinically. 5. Mild abdominal pain. Exam benign. Imaging unremarkable. Has had extensive investigation including recent EGD, colonoscopy as well as 3 CT scans within the last 2 months. Monitor clinically. 6. Paroxysmal atrial fibrillation, currently in NSR. CHADSVASc score of 5, continue amiodarone, carvedilol, and Eliquis 7. DM type 2 with neuropathy, stable. Hgb A1C 5.8. Blood sugars remain stable.  8. HTN, stable.  9. CKD stage IV, stable. Remains at baseline   10. Hypothyroidism, continue synthroid. 11. Normocytic anemia. no bleeding. Stable.  12. GERD, continue PPI. 24. PMH STEMI 01/2015 med management at [facility]; ischemic cardiomyopathy, chronic systolic congestive heart failure LVEF 35-40 percent; dual-chamber ICD, lymphoma 2009 14. 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 This might of enlarged over the past 10 months. The patient's renal insufficiency does not allow for contrast-enhanced MRI. Consider non-contrast-enhanced MRI assessment of the pancreas for further characterization as an outpatient.   Overall improved. Tolerating liquid diet, will advance   Continue IV abx and wean O2 as tolerated.  hopefully discharge within 24 hours on oral antibiotics.   Code Status: Full DVT prophylaxis: Eliquis Family Communication: No family at bedside. Disposition Plan: Discharge home within 48 hours.  Murray Hodgkins, MD  [facility]  Pager [phone] If 7PM-7AM, please contact night-coverage at [email or web address], password [facility] 04/16/2015, 6:59 AM  LOS: 3 days   Consultants:    Procedures:    Antibiotics:  Azithromycin 1/21>>  Rocephin 1/21>>   HPI/Subjective: Feels okay. Tolerating liquids. Still has diffuse abdominal pain and nausea but has not vomited in 24 hours. She like to advance diet. No chest pain. Still coughing. Breathing okay.  Objective: Filed Vitals:   04/15/15 0830 04/15/15 1400 04/15/15 1626 04/15/15 2153  BP:  126/83  103/67  Pulse:  74  63  Temp:  97.8 F (36.6 C)  97.9 F (36.6 C)  TempSrc:  Oral  Oral  Resp:      Height:      Weight:      SpO2: 95% 100% 100% 100%    Intake/Output Summary (Last 24 hours) at 04/16/15 0659 Last data filed at 04/15/15 1700  Gross per  24 hour  Intake    240 ml  Output    300 ml  Net    -60 ml     Filed Weights   04/13/15 2143 04/14/15 0059 04/15/15 [id number]  Weight: 86.183  kg (190 lb) 83.689 kg (184 lb 8 oz) 83.961 kg (185 lb 1.6 oz)    Exam:     VSS, afebrile General: Appears calm today, comfortable Cardiovascular: RRR, no m/r/g. No LE edema. Telemetry: SR, no arrhythmias  Respiratory: CTA bilaterally, no w/r/r. Normal respiratory effort. Abdomen: soft, non-distended. nontender.  Psych: appears calm, speech fluent and appropriate Neurologic: grossly non-focal.  New data reviewed:  BMP without significant change- consistent with CKD  Blood sugars stable   Pertinent data since admission:  BNP 894  Lactic acid 2.88  Troponin 0.11   Scheduled Meds: . amiodarone  200 mg Oral BID  . apixaban  5 mg Oral BID  . aspirin EC  81 mg Oral Daily  . atorvastatin  40 mg Oral q1800  . azithromycin  500 mg Intravenous Q24H  . carvedilol  6.25 mg Oral BID WC  . cefTRIAXone (ROCEPHIN)  IV  1 g Intravenous Q24H  . feeding supplement  1 Container Oral BID BM  . insulin aspart  0-9 Units Subcutaneous TID WC  . isosorbide mononitrate  60 mg Oral Daily  . levothyroxine  50 mcg Oral QAC breakfast  . pantoprazole  40 mg Oral BID  . PARoxetine  10 mg Oral Daily  . potassium chloride SA  20 mEq Oral Daily  . sodium chloride  3 mL Intravenous Q12H  . sucralfate  1 g Oral TID WC & HS  . torsemide  20 mg Oral Daily   Continuous Infusions:   Principal Problem:   Acute on chronic systolic (congestive) heart failure (HCC) Active Problems:   History of stroke   AICD (automatic cardioverter/defibrillator) present, dual chamber medtronic   Cardiomyopathy, ischemic-EF 35-40%   CAD S/P prior RCA PCI, cath 05/03/2013- med Rx, STEMI 01/2015 - 100% prox PDA, treated medically   CKD (chronic kidney disease) stage 3, GFR 30-59 ml/min   Acute respiratory failure with hypoxia (HCC)   Anxiousness   COPD (chronic obstructive pulmonary disease) ([place])   Time Spent: 20 minutes   By signing my name below, I, Rosalie Doctor attest that this documentation has been  prepared under the direction and in the presence of Murray Hodgkins, MD Electronically signed: Rosalie Doctor, Scribe.  04/16/2015  12:29pm  I personally performed the services described in this documentation. All medical record entries made by the scribe were at my direction. I have reviewed the chart and agree that the record reflects my personal performance and is accurate and complete. Murray Hodgkins, MD

## 2015-04-17 ENCOUNTER — Encounter (HOSPITAL_COMMUNITY): Payer: Self-pay | Admitting: Gastroenterology

## 2015-04-17 DIAGNOSIS — R109 Unspecified abdominal pain: Secondary | ICD-10-CM | POA: Insufficient documentation

## 2015-04-17 LAB — RETICULOCYTES
RBC.: 2.59 MIL/uL — ABNORMAL LOW (ref 3.87–5.11)
Retic Count, Absolute: 64.8 10*3/uL (ref 19.0–186.0)
Retic Ct Pct: 2.5 % (ref 0.4–3.1)

## 2015-04-17 LAB — BASIC METABOLIC PANEL
Anion gap: 8 (ref 5–15)
BUN: 32 mg/dL — AB (ref 6–20)
CHLORIDE: 100 mmol/L — AB (ref 101–111)
CO2: 26 mmol/L (ref 22–32)
Calcium: 8.3 mg/dL — ABNORMAL LOW (ref 8.9–10.3)
Creatinine, Ser: 2.8 mg/dL — ABNORMAL HIGH (ref 0.44–1.00)
GFR calc non Af Amer: 17 mL/min — ABNORMAL LOW (ref >60)
GFR, EST AFRICAN AMERICAN: 20 mL/min — AB (ref >60)
Glucose, Bld: 93 mg/dL (ref 65–99)
POTASSIUM: 3.7 mmol/L (ref 3.5–5.1)
SODIUM: 134 mmol/L — AB (ref 135–145)

## 2015-04-17 LAB — CBC
HEMATOCRIT: 31 % — AB (ref 36.0–46.0)
Hemoglobin: 10.4 g/dL — ABNORMAL LOW (ref 12.0–15.0)
MCH: 32.2 pg (ref 26.0–34.0)
MCHC: 33.5 g/dL (ref 30.0–36.0)
MCV: 96 fL (ref 78.0–100.0)
Platelets: 212 10*3/uL (ref 150–400)
RBC: 3.23 MIL/uL — AB (ref 3.87–5.11)
RDW: 18.9 % — ABNORMAL HIGH (ref 11.5–15.5)
WBC: 3.2 10*3/uL — AB (ref 4.0–10.5)

## 2015-04-17 LAB — HEPATIC FUNCTION PANEL
ALBUMIN: 2.6 g/dL — AB (ref 3.5–5.0)
ALK PHOS: 68 U/L (ref 38–126)
ALT: 73 U/L — ABNORMAL HIGH (ref 14–54)
AST: 93 U/L — AB (ref 15–41)
BILIRUBIN TOTAL: 0.6 mg/dL (ref 0.3–1.2)
Bilirubin, Direct: 0.2 mg/dL (ref 0.1–0.5)
Indirect Bilirubin: 0.4 mg/dL (ref 0.3–0.9)
Total Protein: 6.3 g/dL — ABNORMAL LOW (ref 6.5–8.1)

## 2015-04-17 LAB — IRON AND TIBC
IRON: 43 ug/dL (ref 28–170)
SATURATION RATIOS: 17 % (ref 10.4–31.8)
TIBC: 259 ug/dL (ref 250–450)
UIBC: 216 ug/dL

## 2015-04-17 LAB — FERRITIN: FERRITIN: 310 ng/mL — AB (ref 11–307)

## 2015-04-17 LAB — GLUCOSE, CAPILLARY
GLUCOSE-CAPILLARY: 138 mg/dL — AB (ref 65–99)
GLUCOSE-CAPILLARY: 69 mg/dL (ref 65–99)
Glucose-Capillary: 84 mg/dL (ref 65–99)
Glucose-Capillary: 139 mg/dL — ABNORMAL HIGH (ref 65–99)

## 2015-04-17 LAB — FOLATE: Folate: 9.8 ng/mL (ref >5.9)

## 2015-04-17 LAB — LIPASE, BLOOD: Lipase: 42 U/L (ref 11–51)

## 2015-04-17 LAB — VITAMIN B12: Vitamin B-12: 1177 pg/mL — ABNORMAL HIGH (ref 180–914)

## 2015-04-17 MED ORDER — ONDANSETRON 4 MG PO TBDP
4.0000 mg | ORAL_TABLET | Freq: Three times a day (TID) | ORAL | Status: DC
  Administered 2015-04-17 – 2015-04-19 (×8): 4 mg via ORAL
  Filled 2015-04-17 (×8): qty 1

## 2015-04-17 MED ORDER — POLYETHYLENE GLYCOL 3350 17 G PO PACK
17.0000 g | PACK | Freq: Every day | ORAL | Status: DC
  Administered 2015-04-17 – 2015-04-19 (×3): 17 g via ORAL
  Filled 2015-04-17 (×3): qty 1

## 2015-04-17 NOTE — Progress Notes (Signed)
Hypoglycemic Event  CBG: 69  Treatment: 15 GM carbohydrate snack  Symptoms: None  Follow-up CBG: Time: CBG Result:93  Possible Reasons for Event: Inadequate meal intake  Comments/MD notified: Yes    Alysha Doolan L

## 2015-04-17 NOTE — Plan of Care (Signed)
Removed oxygen. Sat on side of bed Spo2 96% on RA. Spo2 89% RA while ambulating. Sat back on side of bed applied nasal cannula Spo2 99%  2L/min.

## 2015-04-17 NOTE — Progress Notes (Signed)
PROGRESS NOTE  [name] Terry [id number] DOB: 1950-10-06 DOA: 04/13/2015 PCP: Pcp Not In System  Summary: 33 yof PMHx including STEMI 01/2015, diabetes, ischemic cardiomyopathy who was recently admitted 02/2015 for multiple reasons including abdominal pain nausea vomiting, that time thought to be secondary to abrupt stop of benzodiazepine, noncompliance with medication. At that time CT abdomen and pelvis was negative. She was admitted for 9 days in January, discharged 1/14 for GI bleed, saw GI at that time underwent EGD 1/9 without acute findings and a colonoscopy 1/13 without acute findings. She was restarted on Eliquis on discharge. She did require 2 units packed blood cells at that time. According to the chart she was discharged from [facility] 1/20 and went immediately to [facility] with complaints of shortness of breath, chest pain, abdominal pain, nausea, vomiting. She was admitted for acute on chronic systolic congestive heart failure as well as acute hypoxic respiratory failure.  Assessment/Plan: 1. Acute respiratory failure with hypoxia. Resolved. 2. Pneumonia. Improving clinically. Remains afebrile with hypoxia resolving. 3. Mild intermittent abdominal pain along clear etiology and significance with history of extensive investigation including recent EGD, colonoscopy as well as 3 CT scans within the last 2 months. Elevated lipase of unclear clinical significance given pancreatic mass, consult GI, check MRI. 4. Paroxysmal atrial fibrillation. CHADSVASc score of 5, continue amiodarone, carvedilol, and Eliquis 5. DM type 2 with neuropathy, stable. Hgb A1C 5.8. Blood sugars remain stable.  6. HTN 7. CKD stage IV, stable. Remains at baseline  8. Hypothyroidism, continue Synthroid. 9. Normocytic anemia. no bleeding. Stable.  10. GERD, continue PPI. 11. PMH STEMI 01/2015 med management at [facility]; ischemic cardiomyopathy, chronic systolic congestive heart failure LVEF 35-40 percent;  dual-chamber ICD, lymphoma 2009 12. 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 This might of enlarged over the past 10 months. The patient's renal insufficiency does not allow for contrast-enhanced MRI. Consider non-contrast-enhanced MRI assessment of the pancreas for further characterization as an outpatient. 13. Severe malnutrition in the context of chronic illness, nutrition following.    Overall improved in regards to heart failure and PNA. Hypoxia resolved. Intermittent abd pain, although none currently, with mildly elevated lipase and pancreatic mass of unclear significance. Plan GI consult and consider MRI abd to further evaluate.   Change to oral abx tomorrow.   If tolerate diet likely home 24 hours.    Code Status: Full DVT prophylaxis: Eliquis Family Communication: No family at bedside. Disposition Plan: Anticipate discharge home within 24 hours.  Murray Hodgkins, MD  [facility]  Pager [phone] If 7PM-7AM, please contact night-coverage at [email or web address], password [facility] 04/17/2015, 7:23 AM  LOS: 4 days   Consultants:    Procedures:    Antibiotics:  Azithromycin 1/21>>  Rocephin 1/21>>   HPI/Subjective: Doing well. Denies nausea and vomiting. Was able to eat a small amount without difficulty. Abdominal pain overnight has resolved. She is breathing okay. No chest pain.  Objective: Filed Vitals:   04/16/15 1451 04/16/15 2115 04/16/15 2342 04/17/15 0549  BP: 117/73 104/77 115/74 113/79  Pulse: 69 72 69 61  Temp: 98.5 F (36.9 C) 97.6 F (36.4 C) 97.6 F (36.4 C) 97.7 F (36.5 C)  TempSrc: Oral Oral Oral Oral  Resp: 20 16 15 16   Height:      Weight:    83.643 kg (184 lb 6.4 oz)  SpO2: 100% 100% 100% 100%    Intake/Output Summary (Last 24 hours) at  04/17/15 0723 Last data filed at 04/16/15 1900  Gross per 24 hour  Intake   1160 ml  Output    475 ml  Net    685  ml     Filed Weights   04/14/15 0059 04/15/15 0632 04/17/15 0549  Weight: 83.689 kg (184 lb 8 oz) 83.961 kg (185 lb 1.6 oz) 83.643 kg (184 lb 6.4 oz)    Exam:  VSS, afebrile, on [place]. General:  Appears calm and comfortable. Cardiovascular: RRR, 2/6 systolic murmur. No r/g. No LE edema. Respiratory: CTA bilaterally, no w/r/r. Normal respiratory effort. Abdomen: soft, non-tender,  Musculoskeletal: grossly normal tone BUE/BLE Psychiatric: grossly normal mood and affect, speech fluent and appropriate  New data reviewed:  BMP without significant change- consistent with CKD  CBC stable  CMP unremarkable lipase elevated at 93  Pertinent data since admission:  BNP 894  Lactic acid 2.88  Troponin 0.11   Scheduled Meds: . amiodarone  200 mg Oral BID  . apixaban  5 mg Oral BID  . aspirin EC  81 mg Oral Daily  . atorvastatin  40 mg Oral q1800  . azithromycin  500 mg Intravenous Q24H  . carvedilol  6.25 mg Oral BID WC  . cefTRIAXone (ROCEPHIN)  IV  1 g Intravenous Q24H  . feeding supplement  1 Container Oral BID BM  . insulin aspart  0-9 Units Subcutaneous TID WC  . isosorbide mononitrate  60 mg Oral Daily  . levothyroxine  50 mcg Oral QAC breakfast  . pantoprazole  40 mg Oral BID  . PARoxetine  10 mg Oral Daily  . potassium chloride SA  20 mEq Oral Daily  . sodium chloride  3 mL Intravenous Q12H  . sucralfate  1 g Oral TID WC & HS  . torsemide  20 mg Oral Daily   Continuous Infusions:   Principal Problem:   Acute on chronic systolic (congestive) heart failure (HCC) Active Problems:   History of stroke   AICD (automatic cardioverter/defibrillator) present, dual chamber medtronic   Cardiomyopathy, ischemic-EF 35-40%   CAD S/P prior RCA PCI, cath 05/03/2013- med Rx, STEMI 01/2015 - 100% prox PDA, treated medically   CKD (chronic kidney disease) stage 3, GFR 30-59 ml/min   Acute respiratory failure with hypoxia (HCC)   Anxiousness   COPD (chronic obstructive pulmonary  disease) ([place])   Time Spent: 20 minutes    By signing my name below, I, Rennis Harding attest that this documentation has been prepared under the direction and in the presence of Murray Hodgkins, MD Electronically signed: Rennis Harding  04/17/2015 9:02am   I personally performed the services described in this documentation. All medical record entries made by the scribe were at my direction. I have reviewed the chart and agree that the record reflects my personal performance and is accurate and complete. Murray Hodgkins, MD

## 2015-04-17 NOTE — Consult Note (Signed)
Referring Provider: Dr. Sarajane Jews Primary Care Physician:  [place] Not In System Primary Gastroenterologist:  Carlena Bjornstad (patient unsure exact name)  Date of Admission: 04/14/15 Date of Consultation: 04/17/15  Reason for Consultation:  Abdominal pain   HPI:  [name] Terry is a 65 y.o. year old female with a significant cardiac history to include STEMI in 01/2015, with multiple prior admissions for GI bleed, abdominal pain, N/V. This admission presented with shortness of breath, chest pain, abdominal pain, nausea, and vomiting. She was admitted with acute on chronic systolic heart failure, acute respiratory failure, pneumonia.  Multiple CT scans WITHOUT CONTRAST on file. Since Nov 2016 Hgb has been in the 10/11 range.   Evaluated in [facility] Dec 2016 during hospitalization by Dr. Watt Climes: US abdomen unrevealing. Readmitted in Jan 2017 and underwent EGD and colonoscopy by Dr. Oletta Lamas without any significant findings, which was performed due to atypical chest pain, dark stools, and intermittent bright red blood per rectum. She received 2 units PRBCs that admission. She had been on Eliquis and this was resumed. Per Dr. Oletta Lamas' last progress note on 1/14 during previous admission, suggested bleeding scan if recurrent bleeding. She has an implantable defibrillator; it is unclear if this device is compatible with MRI. CT chest without contrast reported a complex lesion that appeared to be arising from the pancreas tail with cystic component and posterior calcification. CT abd/pelvis without contrast on 04/13/15, 03/10/15, 02/26/15 do not mention this. Lipase only minimally elevated 2 weeks ago at 61, non-specific. She has had mildly elevated transaminases as well, dating back to March 2016.   Today, she is drowsy but easily awakens. VERY poor historian. It appears she is not clear on why she is admitted this time. She is unable to give me a clear history on what led her to this admission. She denies abdominal  pain currently and states that historically it was a "2 or 3" on a pain scale. She has abdominal pain after eating at times but not consistently. She denies any hematochezia or melena recently. She has a BM every 2-3 days. Intermittent nausea without true vomiting, stating she "gags and coughs", feeling better after it "comes up". States food does not taste right. Believes she has lost from 189 to 174; her weight is 184 today. Graph of weights in epic reveal a steady downward trend, peaking in the low 200 range late last year.    Past Medical History  Diagnosis Date  . Ischemic cardiomyopathy     a. s/p MDT dual chamber ICD implanted 2013 by Dr Westley Gambles at [facility], now followed by Dr Rayann Heman. b. LVEF 30-35% by echo 01/2015 at [facility] per report; 35-40% by echo 01/2015 at [facility].  Marland Kitchen History of stroke      a. 1993  . Coronary artery disease     a. s/p previous interventions in [place] per patient, no records available. b. cath 04/2013 with non-obstructive disease. c. STEMI 01/2015 @ [place] - cardiac cath had shown 100% stenosis of prox PDA, patent stent in RCA with otherwise normal coronary arteries.  . Lymphoma ([place])     a. s/p chemo/radiation 2009  . Chronic systolic CHF (congestive heart failure) ([place])   . HTN (hypertension)   . HLD (hyperlipidemia)   . Ventricular tachycardia ([place])     a. CL 300 msec requiring ICD shocks therpay 5/14 b. recurrent VT 04/2014, placed on amiodarone c. recurrent VT 05/2014 s/p ablation by Dr Rayann Heman d. s/p repeat ablation 07/2014  . DM2 (diabetes mellitus,  type 2), newly diagnosed    . CKD (chronic kidney disease) stage 3, GFR 30-59 ml/min    . Polymyalgia rheumatica ([place])   . Depression   . GERD (gastroesophageal reflux disease)   . Renal atrophy, left     a. [place] 02/2015: "Left renal atrophy, likely from chronic renal arterial stenosis"  . Mitral regurgitation     a. mod by echo 01/2015.  . Diverticulosis     a. By CT 02/2015.  Marland Kitchen PAF (paroxysmal atrial  fibrillation) ([place])     a. Brief paroxysms during admission 02/2015 - amiodarone increased. Not placed on anticoag due to dual antiplatelet therapy and bleeding risk including in the setting of possible GI pathology.   . Hiatal hernia     a. by EGD 06/2014.  Marland Kitchen Gastritis     a. by EGD 06/2014.  Marland Kitchen Anxiousness 03/02/2015    Past Surgical History  Procedure Laterality Date  . Cardiac defibrillator placement  03/2011    MDT ICD implanted at [facility] by Dr Tana Coast  . Coronary angioplasty with stent placement    . Left and right heart catheterization with coronary angiogram N/A 05/03/2013    non-obstructive CAD  . V-tach ablation N/A 05/26/2014    VT ablation by Dr Rayann Heman - PVCs arising from the inferolateral LV, extensive substrate ablation  . Electrophysiologic study N/A 08/04/2014    Procedure: V Tach Ablation;  Surgeon: Thompson Grayer, MD;  Location: [facility];  Service: Cardiovascular;  Laterality: N/A;  . Esophagogastroduodenoscopy (egd) with propofol N/A 04/03/2015    Dr. Oletta Lamas: normal  . Colonoscopy N/A 04/07/2015    Dr. Oletta Lamas: diverticulosis, internal hemorrhoids     Prior to Admission medications   Medication Sig Start Date End Date Taking? Authorizing Provider  ALPRAZolam (XANAX) 0.25 MG tablet Take 1-2 tablets (0.25-0.5 mg total) by mouth 3 (three) times daily as needed for anxiety. 03/02/15  Yes Rexene Alberts, MD  amiodarone (PACERONE) 200 MG tablet Take 1 tablet (200 mg total) by mouth 2 (two) times daily. 02/28/15  Yes Dayna N Dunn, PA-C  apixaban (ELIQUIS) 5 MG TABS tablet Take 5 mg by mouth 2 (two) times daily.   Yes Historical Provider, MD  aspirin EC 81 MG tablet Take 81 mg by mouth daily.   Yes Historical Provider, MD  atorvastatin (LIPITOR) 40 MG tablet Take 1 tablet (40 mg total) by mouth daily at 6 PM. 06/20/14  Yes Thompson Grayer, MD  carvedilol (COREG) 6.25 MG tablet Take 1 tablet (6.25 mg total) by mouth 2 (two) times daily with a meal. 04/08/15  Yes Costin Karlyne Greenspan, MD   GLIPIZIDE XL 2.5 MG 24 hr tablet Take 2.5 mg by mouth daily as needed (blood sugar).  03/21/15  Yes Historical Provider, MD  isosorbide mononitrate (IMDUR) 60 MG 24 hr tablet Take 1 tablet (60 mg total) by mouth daily. 03/12/15  Yes Allie Bossier, MD  levothyroxine (SYNTHROID, LEVOTHROID) 50 MCG tablet Take 1 tablet (50 mcg total) by mouth daily before breakfast. 03/12/15  Yes Allie Bossier, MD  Multiple Vitamin (MULTI VITAMIN DAILY PO) Take 1 tablet by mouth daily.   Yes Historical Provider, MD  ondansetron (ZOFRAN) 4 MG tablet Take 4 mg by mouth every 6 (six) hours as needed. Nausea 03/18/15  Yes Historical Provider, MD  oxyCODONE-acetaminophen (PERCOCET/ROXICET) 5-325 MG tablet Take 1 tablet by mouth every 8 (eight) hours as needed for severe pain. 04/08/15  Yes Costin Karlyne Greenspan, MD  pantoprazole (PROTONIX) 40 MG  tablet Take 1 tablet (40 mg total) by mouth 2 (two) times daily. 02/28/15  Yes Dayna N Dunn, PA-C  PARoxetine (PAXIL) 10 MG tablet Take 1 tablet (10 mg total) by mouth daily. 03/12/15  Yes Allie Bossier, MD  potassium chloride SA (K-DUR,KLOR-CON) 20 MEQ tablet Take 20 mEq by mouth daily.   Yes Historical Provider, MD  promethazine (PHENERGAN) 25 MG tablet Take 25 mg by mouth every 6 (six) hours as needed for nausea or vomiting.   Yes Historical Provider, MD  sucralfate (CARAFATE) 1 GM/10ML suspension Take 10 mLs (1 g total) by mouth 4 (four) times daily -  with meals and at bedtime. 02/28/15  Yes Dayna N Dunn, PA-C  torsemide (DEMADEX) 20 MG tablet Take 1 tablet (20 mg total) by mouth daily. 03/12/15  Yes Allie Bossier, MD  nitroGLYCERIN (NITROSTAT) 0.4 MG SL tablet Place 1 tablet (0.4 mg total) under the tongue every 5 (five) minutes as needed for chest pain. 07/04/14   Amber Sena Slate, NP  traMADol (ULTRAM) 50 MG tablet Take 1 tablet (50 mg total) by mouth every 12 (twelve) hours as needed for moderate pain or severe pain. Patient taking differently: Take 50 mg by mouth every 12 (twelve)  hours as needed for moderate pain or severe pain. pain 02/28/15   Charlie Pitter, PA-C    Current Facility-Administered Medications  Medication Dose Route Frequency Provider Last Rate Last Dose  . 0.9 %  sodium chloride infusion  250 mL Intravenous PRN Phillips Grout, MD      . acetaminophen (TYLENOL) tablet 650 mg  650 mg Oral Q4H PRN Phillips Grout, MD      . ALPRAZolam Duanne Moron) tablet 0.25-0.5 mg  0.25-0.5 mg Oral TID PRN Phillips Grout, MD   0.5 mg at 04/16/15 2229  . amiodarone (PACERONE) tablet 200 mg  200 mg Oral BID Phillips Grout, MD   200 mg at 04/17/15 0943  . apixaban (ELIQUIS) tablet 5 mg  5 mg Oral BID Phillips Grout, MD   5 mg at 04/17/15 0943  . aspirin EC tablet 81 mg  81 mg Oral Daily Phillips Grout, MD   81 mg at 04/17/15 0943  . atorvastatin (LIPITOR) tablet 40 mg  40 mg Oral q1800 Phillips Grout, MD   40 mg at 04/16/15 1709  . azithromycin (ZITHROMAX) 500 mg in dextrose 5 % 250 mL IVPB  500 mg Intravenous Q24H Samuella Cota, MD   500 mg at 04/16/15 1455  . carvedilol (COREG) tablet 6.25 mg  6.25 mg Oral BID WC Phillips Grout, MD   6.25 mg at 04/17/15 0825  . cefTRIAXone (ROCEPHIN) 1 g in dextrose 5 % 50 mL IVPB  1 g Intravenous Q24H Samuella Cota, MD   1 g at 04/16/15 1420  . feeding supplement (BOOST / RESOURCE BREEZE) liquid 1 Container  1 Container Oral BID BM Oswaldo Milian, RD   1 Container at 04/17/15 0945  . insulin aspart (novoLOG) injection 0-9 Units  0-9 Units Subcutaneous TID WC Phillips Grout, MD   2 Units at 04/16/15 1709  . isosorbide mononitrate (IMDUR) 24 hr tablet 60 mg  60 mg Oral Daily Phillips Grout, MD   60 mg at 04/17/15 0943  . levothyroxine (SYNTHROID, LEVOTHROID) tablet 50 mcg  50 mcg Oral QAC breakfast Phillips Grout, MD   50 mcg at 04/17/15 0825  . ondansetron (ZOFRAN) injection 4 mg  4 mg  Intravenous Q6H PRN Phillips Grout, MD   4 mg at 04/16/15 1035  . oxyCODONE-acetaminophen (PERCOCET/ROXICET) 5-325 MG per tablet 1 tablet  1 tablet Oral Q8H  PRN Phillips Grout, MD   1 tablet at 04/16/15 2228  . pantoprazole (PROTONIX) EC tablet 40 mg  40 mg Oral BID Phillips Grout, MD   40 mg at 04/17/15 0944  . PARoxetine (PAXIL) tablet 10 mg  10 mg Oral Daily Phillips Grout, MD   10 mg at 04/17/15 0944  . potassium chloride SA (K-DUR,KLOR-CON) CR tablet 20 mEq  20 mEq Oral Daily Phillips Grout, MD   20 mEq at 04/17/15 0944  . promethazine (PHENERGAN) suppository 25 mg  25 mg Rectal Q6H PRN Samuella Cota, MD   25 mg at 04/15/15 1113  . sodium chloride 0.9 % injection 3 mL  3 mL Intravenous Q12H Phillips Grout, MD   3 mL at 04/17/15 1015  . sodium chloride 0.9 % injection 3 mL  3 mL Intravenous PRN Phillips Grout, MD      . sucralfate (CARAFATE) 1 GM/10ML suspension 1 g  1 g Oral TID WC & HS Phillips Grout, MD   1 g at 04/17/15 0826  . torsemide (DEMADEX) tablet 20 mg  20 mg Oral Daily Orson Eva, MD   20 mg at 04/17/15 0944  . traMADol (ULTRAM) tablet 50 mg  50 mg Oral Q12H PRN Phillips Grout, MD   50 mg at 04/16/15 1611    Allergies as of 04/13/2015 - Review Complete 04/07/2015  Allergen Reaction Noted  . Flagyl [metronidazole] Swelling and Rash 07/19/2012  . Contrast media [iodinated diagnostic agents] Rash 07/19/2012  . Ioxaglate Rash 05/25/2014  . Potassium sulfate Rash and Other (See Comments) 05/25/2014  . Potassium-containing compounds Other (See Comments) 07/19/2012    Family History  Problem Relation Age of Onset  . Diabetes Father   . Hypertension Father   . Heart disease Father   . Alcoholism Father   . Hypertension Mother   . Heart disease Mother   . Breast cancer Mother   . Stroke Mother   . Diabetes Brother   . Diabetes Brother   . Diabetes Brother   . Diabetes Brother   . Hypertension Sister   . Heart disease Sister   . Alcoholism Sister   . Thyroid disease Sister   . Heart attack Sister   . Heart attack Brother   . Stroke Brother   . Colon cancer Neg Hx     Social History   Social History  . Marital  Status: Single    Spouse Name: N/A  . Number of Children: 2  . Years of Education: N/A   Occupational History  . Not on file.   Social History Main Topics  . Smoking status: Former Smoker -- 0.25 packs/day    Quit date: 05/20/2014  . Smokeless tobacco: Never Used     Comment: she is not ready to quit  . Alcohol Use: No  . Drug Use: No  . Sexual Activity: Not on file   Other Topics Concern  . Not on file   Social History Narrative    Review of Systems: Gen: see HPI  CV: Denies chest pain, heart palpitations, syncope, edema  Resp: +SOB, cough GI: see HPI  GU : Denies urinary burning, urinary frequency, urinary incontinence.  MS: +left hip pain  Derm: Denies rash, itching, dry skin Psych: +anxiety  Heme: Denies bruising,  bleeding, and enlarged lymph nodes.  Physical Exam: Vital signs in last 24 hours: Temp:  [97.5 F (36.4 C)-98.5 F (36.9 C)] 97.5 F (36.4 C) (01/23 0800) Pulse Rate:  [61-72] 64 (01/23 0800) Resp:  [15-20] 16 (01/23 0800) BP: (104-121)/(73-79) 121/75 mmHg (01/23 0800) SpO2:  [89 %-100 %] 99 % (01/23 0910) Weight:  [184 lb 6.4 oz (83.643 kg)] 184 lb 6.4 oz (83.643 kg) (01/23 0549) Last BM Date: 04/13/15 General:   Drowsy but easily awakens. Poor historian. Flat affect. Appears peaceful when entering room and becomes more anxious with discussion and waking Head:  Normocephalic and atraumatic. Eyes:  Sclera clear, no icterus.   Conjunctiva pink. Ears:  Normal auditory acuity. Mouth:  No deformity or lesions Lungs:  Mild expiratory wheeze  Heart:  S1 S2 present, regular  Abdomen:  Soft, nontender and nondistended. No masses, hepatosplenomegaly or hernias noted. Normal bowel sounds, without guarding, and without rebound.   Rectal:  Deferred  Msk:  Symmetrical without gross deformities. Normal posture. Extremities:  Without edema. Neurologic:  Alert and  oriented to person, year. Slight confusion regarding place but quickly reoriented. Skin:  Intact  without significant lesions or rashes. Psych:  Alert and cooperative. Flat affect, mildly anxious  Intake/Output from previous day: 01/22 0701 - 01/23 0700 In: 1160 [P.O.:1160] Out: 475 [Urine:475] Intake/Output this shift: Total I/O In: 240 [P.O.:240] Out: -   Lab Results:  Recent Labs  04/15/15 0558 04/17/15 0621  WBC 3.8* 3.2*  HGB 10.5* 10.4*  HCT 29.5* 31.0*  PLT 196 212   BMET  Recent Labs  04/15/15 0558 04/16/15 0443 04/17/15 0621  NA 135 135 134*  K 3.6 3.7 3.7  CL 100* 100* 100*  CO2 28 26 26   GLUCOSE 101* 72 93  BUN 26* 30* 32*  CREATININE 2.78* 2.99* 2.80*  CALCIUM 8.5* 8.4* 8.3*   LFT  Recent Labs  04/17/15 0621  PROT 6.3*  ALBUMIN 2.6*  AST 93*  ALT 73*  ALKPHOS 68  BILITOT 0.6  BILIDIR 0.2  IBILI 0.4    Impression: 65 year old female with multiple comorbidities and extensive cardiac history, multiple prior hospitalizations for abdominal pain, history of GI bleed on Eliquis, now admitted with acute respiratory failure, pneumonia, acute on chronic systolic heart failure. GI consulted due to abdominal pain, which appears chronic in nature. Encouragingly, she had a negative EGD and colonoscopy in Jan 2017 in [place] by Dr. Oletta Lamas, and Eliquis remains on board due to history of afib. She has had no evidence of recent overt GI bleeding, and her Hgb is remaining in the 10 range, which is unchanged from last discharge. She did receive 2 units PRBCs during prior hospitalization 1/5-1/14. I feel she may have a mixed pattern anemia. Will draw iron studies.   Abdominal pain: appears chronic in nature and likely multifactorial. Underlying constipation could be playing a role, along with a suspected psychiatric overlay.  EGD/colonoscopy unrevealing. Gallbladder remains in situ, but it does not appear biliary in nature. With her significant atherosclerotic disease, I would be concerned about chronic mesenteric ischemia, and her weight has been trending down  over the past few months. However, she is unable to undergo CT with contrast due to renal function. She is also unable to have an MRI, after discussion with Audelia Acton, MRI tech, due to implantable defibrillator. Concern for pancreatic tail lesion is discussed on recent CT chest without contrast, but no mention is made of this on multiple prior CT scans. This will need further  evaluation to rule out as a culprit for abdominal pain. I will be reviewing this with radiology for clarification, prior to ordering any further imaging. Mildly elevated lipase of 61 several weeks ago non-specific with low concern for pancreatitis. For now, will treat constipation, continue PPI, and decide on further imaging shortly.   Elevated LFTs: mildly elevated transaminases dating back to March 2016. US abdomen Dec 2016 noting within normal limits liver, no gallstones, normal CBD at 26mm. No known history of liver disease, and no stigmata of liver disease on recent EGD. I do not see where she has had any work-up for this. Could likely have underlying fatty liver disease, with fluctuations in the setting of acute illness. Denies ETOH use. Doubt this is contributing to her chronic complaints. Will order baseline labs to further investigate, as this appears chronic without prior work-up. Would possibly benefit from an elastography as outpatient if persistent elevations.   Plan: ~Continue Protonix BID ~Follow H/H: no signs of overt GI bleeding currently ~Check anemia panel ~Will discuss with radiologist best imaging modality to further evaluate pancreas and mesenteric vasculature to wrap up evaluation for abdominal pain ~Will add scheduled Zofran ~Miralax daily ~Further serologies for elevated LFTs, consider elastography as outpatient after acute illness for baseline  Orvil Feil, ANP-BC [facility]      LOS: 4 days    04/17/2015, 12:05 PM    ADDENDUM AT 1415: Spoke with Dr. Thornton Papas. The CT chest without  contrast does note a small complex lesion but unable to tell if it is pancreatic or renal lesion. Unfortunately, she is not a candidate for an MRI due to defibrillator present. Could consider a PET scan but this would be likely LOW yield, as would an ultrasound due to body habitus. The best option, per Dr. Thornton Papas, would be to repeat a non-contrast CT in 6 months to evaluate if any interval enlargement. As for mesenteric vasculature: atherosclerotic disease is noted at the origin of SMA and celiac, but no overt high grade stenosis. AGAIN, this is difficult to interpret on CT without contrast, and she is not a candidate for an MR angiography to assess further. Appears she will need close, serial monitoring of symptoms, evidence of weight loss, any other concerning signs that would necessitate either early interval CT of pancreas and/or referral to IR only if there was concern for mesenteric ischemia.  Orvil Feil, ANP-BC [facility]

## 2015-04-18 DIAGNOSIS — R1012 Left upper quadrant pain: Secondary | ICD-10-CM

## 2015-04-18 DIAGNOSIS — J189 Pneumonia, unspecified organism: Secondary | ICD-10-CM | POA: Insufficient documentation

## 2015-04-18 LAB — GLUCOSE, CAPILLARY
GLUCOSE-CAPILLARY: 93 mg/dL (ref 65–99)
GLUCOSE-CAPILLARY: 117 mg/dL — AB (ref 65–99)
GLUCOSE-CAPILLARY: 124 mg/dL — AB (ref 65–99)
Glucose-Capillary: 92 mg/dL (ref 65–99)
Glucose-Capillary: 122 mg/dL — ABNORMAL HIGH (ref 65–99)

## 2015-04-18 LAB — ANTI-SMOOTH MUSCLE ANTIBODY, IGG: F-Actin IgG: 33 Units — ABNORMAL HIGH (ref 0–19)

## 2015-04-18 LAB — ANTINUCLEAR ANTIBODIES, IFA: ANA Ab, IFA: NEGATIVE

## 2015-04-18 LAB — HEMOGLOBIN AND HEMATOCRIT, BLOOD
HCT: 30.5 % — ABNORMAL LOW (ref 36.0–46.0)
Hemoglobin: 9.8 g/dL — ABNORMAL LOW (ref 12.0–15.0)

## 2015-04-18 LAB — MITOCHONDRIAL ANTIBODIES: MITOCHONDRIAL M2 AB, IGG: 6.1 U (ref 0.0–20.0)

## 2015-04-18 LAB — HEPATITIS C ANTIBODY: HCV Ab: <0.1 s/co ratio (ref 0.0–0.9)

## 2015-04-18 LAB — HEPATITIS B SURFACE ANTIGEN: Hepatitis B Surface Ag: NEGATIVE

## 2015-04-18 LAB — LIPASE, BLOOD: Lipase: 40 U/L (ref 11–51)

## 2015-04-18 NOTE — Clinical Social Work Placement (Signed)
   CLINICAL SOCIAL WORK PLACEMENT  NOTE  Date:  04/18/2015  Patient Details  Name: [name] Terry MRN: [id number] Date of Birth: Mar 21, 1951  Clinical Social Work is seeking post-discharge placement for this patient at the [place] level of care (*CSW will initial, date and re-position this form in  chart as items are completed):  Yes   Patient/family provided with [facility] Work Department's list of facilities offering this level of care within the geographic area requested by the patient (or if unable, by the patient's family).  Yes   Patient/family informed of their freedom to choose among providers that offer the needed level of care, that participate in Medicare, Medicaid or managed care program needed by the patient, have an available bed and are willing to accept the patient.  Yes   Patient/family informed of [facility]'s ownership interest in [facility] and [facility], as well as of the fact that they are under no obligation to receive care at these facilities.  PASRR submitted to EDS on 04/18/15     PASRR number received on 04/18/15     Existing PASRR number confirmed on       FL2 transmitted to all facilities in geographic area requested by pt/family on       FL2 transmitted to all facilities within larger geographic area on 04/18/15     Patient informed that his/her managed care company has contracts with or will negotiate with certain facilities, including the following:            Patient/family informed of bed offers received.  Patient chooses bed at       Physician recommends and patient chooses bed at      Patient to be transferred to   on  .  Patient to be transferred to facility by       Patient family notified on   of transfer.  Name of family member notified:        PHYSICIAN       Additional Comment:    _______________________________________________ [name], LCSW 04/18/2015, 12:53 PM

## 2015-04-18 NOTE — NC FL2 (Signed)
[place] MEDICAID FL2 LEVEL OF CARE SCREENING TOOL     IDENTIFICATION  Patient Name: [name] Terry Birthdate: 08-22-1950 Sex: female Admission Date (Current Location): 04/13/2015  [facility] and [place] Number:  Marina Gravel  ([id number]) Facility and Address:  [facility] [address] [place]      Provider Number: [id number]  Attending Physician Name and Address:  Samuella Cota, MD  Relative Name and Phone Number:       Current Level of Care: Hospital Recommended Level of Care: [place] Prior Approval Number:    Date Approved/Denied:   PASRR Number:  ([id number])  Discharge Plan: SNF    Current Diagnoses: Patient Active Problem List   Diagnosis Date Noted  . AP (abdominal pain)   . COPD (chronic obstructive pulmonary disease) ([place]) 04/14/2015  . Hypothyroidism 03/31/2015  . GI bleed 03/30/2015  . Generalized abdominal pain   . Gastroesophageal reflux disease with esophagitis   . Hiatal hernia   . Controlled type 2 diabetes mellitus with stage 4 chronic kidney disease ([place])   . Noncompliance with medication regimen   . Persistent atrial fibrillation ([place])   . Abdominal pain, left upper quadrant   . CAD in native artery   . Hypokalemia   . Paroxysmal atrial fibrillation (HCC)   . Essential hypertension   . Hematemesis with nausea   . Hemoptysis   . Nausea with vomiting   . Other specified hypothyroidism   . Chronic kidney disease (CKD), stage IV (severe) ([place])   . Anxiety state   . Acute respiratory failure with hypoxemia ([place])   . Respiratory failure ([place])   . GERD (gastroesophageal reflux disease) 03/06/2015  . Acute pulmonary edema (HCC)   . Acute on chronic combined systolic and diastolic congestive heart failure, NYHA class 4 ([place])   . Acute on chronic respiratory failure with hypoxia and hypercapnia (HCC)   . Acute bronchitis 03/05/2015  . Angina at rest [facility]) 03/04/2015  . Cough with hemoptysis 03/04/2015   . Diabetes mellitus type 2 in obese ([place]) 03/04/2015  . Chest pain 03/03/2015  . Anxiousness 03/02/2015  . Acute on chronic systolic CHF (congestive heart failure) ([place]) 03/01/2015  . Acute respiratory failure with hypoxia ([place]) 03/01/2015  . PAF (paroxysmal atrial fibrillation) ([place]) 02/28/2015  . Abdominal pain - possible recurrent gastritis 02/28/2015  . Diverticulosis 02/28/2015  . Mitral regurgitation 02/28/2015  . Abnormal serum level of amylase 02/28/2015  . Abnormal LFTs 02/28/2015  . Leukocytosis 02/28/2015  . Abnormal TSH 02/28/2015  . Elevated troponin 02/20/2015  . Unstable angina ([place])   . Acute on chronic systolic (congestive) heart failure ([place])   . VT (ventricular tachycardia) ([place]) 08/04/2014  . Gout 03/23/2014  . DM2 with nephropathy 05/05/2013  . CKD (chronic kidney disease) stage 3, GFR 30-59 ml/min 05/05/2013  . Ventricular tachycardia ([place]) 11/06/2012  . Cardiomyopathy, ischemic-EF 35-40% 11/06/2012  . CAD S/P prior RCA PCI, cath 05/03/2013- med Rx, STEMI 01/2015 - 100% prox PDA, treated medically 11/06/2012  . AICD (automatic cardioverter/defibrillator) present, dual chamber medtronic 10/21/2012  . History of stroke   . Myocardial infarction ([place])   . Lymphoma ([place])   . CHF (congestive heart failure) ([place])   . HTN (hypertension)   . HLD (hyperlipidemia)   . Kidney disease   . Alopecia 05/19/2012  . Lichenification and lichen simplex chronicus 12/05/2011    Orientation RESPIRATION BLADDER Height & Weight    Self, Time, Situation, Place  O2 (1.5L) Incontinent 5'  5" (165.1 cm) 186 lbs.  BEHAVIORAL SYMPTOMS/MOOD NEUROLOGICAL BOWEL NUTRITION STATUS      Continent Diet (Diet Carb Modified)  AMBULATORY STATUS COMMUNICATION OF NEEDS Skin   Limited Assist Verbally Normal                       Personal Care Assistance Level of Assistance  Bathing, Dressing Bathing Assistance: Limited assistance   Dressing Assistance: Limited assistance      Functional Limitations Info  Hearing, Speech Sight Info: Adequate Hearing Info: Adequate Speech Info: Adequate    SPECIAL CARE FACTORS FREQUENCY                       Contractures      Additional Factors Info  Allergies, Psychotropic, Insulin Sliding Scale   Allergies Info:  (Flagyl, Contrast Media (Iodinated Diagnositc Agents), Ioxaglate, Potassium Sulfate, Potassium-containing Compounds) Psychotropic Info:  (Xanax) Insulin Sliding Scale Info:  (3x dailiy)       Current Medications (04/18/2015):  This is the current hospital active medication list Current Facility-Administered Medications  Medication Dose Route Frequency Provider Last Rate Last Dose  . 0.9 %  sodium chloride infusion  250 mL Intravenous PRN Phillips Grout, MD      . acetaminophen (TYLENOL) tablet 650 mg  650 mg Oral Q4H PRN Phillips Grout, MD      . ALPRAZolam Duanne Moron) tablet 0.25-0.5 mg  0.25-0.5 mg Oral TID PRN Phillips Grout, MD   0.5 mg at 04/18/15 1144  . amiodarone (PACERONE) tablet 200 mg  200 mg Oral BID Phillips Grout, MD   200 mg at 04/18/15 1144  . apixaban (ELIQUIS) tablet 5 mg  5 mg Oral BID Phillips Grout, MD   5 mg at 04/18/15 1144  . aspirin EC tablet 81 mg  81 mg Oral Daily Phillips Grout, MD   81 mg at 04/18/15 1144  . atorvastatin (LIPITOR) tablet 40 mg  40 mg Oral q1800 Phillips Grout, MD   40 mg at 04/17/15 1652  . azithromycin (ZITHROMAX) 500 mg in dextrose 5 % 250 mL IVPB  500 mg Intravenous Q24H Samuella Cota, MD   500 mg at 04/17/15 1651  . carvedilol (COREG) tablet 6.25 mg  6.25 mg Oral BID WC Phillips Grout, MD   6.25 mg at 04/18/15 0819  . cefTRIAXone (ROCEPHIN) 1 g in dextrose 5 % 50 mL IVPB  1 g Intravenous Q24H Samuella Cota, MD   1 g at 04/17/15 1423  . feeding supplement (BOOST / RESOURCE BREEZE) liquid 1 Container  1 Container Oral BID BM Oswaldo Milian, RD   1 Container at 04/18/15 1145  . insulin aspart (novoLOG) injection 0-9 Units  0-9 Units Subcutaneous TID  WC Phillips Grout, MD   1 Units at 04/17/15 1224  . isosorbide mononitrate (IMDUR) 24 hr tablet 60 mg  60 mg Oral Daily Phillips Grout, MD   60 mg at 04/18/15 1144  . levothyroxine (SYNTHROID, LEVOTHROID) tablet 50 mcg  50 mcg Oral QAC breakfast Phillips Grout, MD   50 mcg at 04/18/15 [phone]  . ondansetron (ZOFRAN) injection 4 mg  4 mg Intravenous Q6H PRN Phillips Grout, MD   4 mg at 04/18/15 0223  . ondansetron (ZOFRAN-ODT) disintegrating tablet 4 mg  4 mg Oral TID AC & HS Orvil Feil, NP   4 mg at 04/18/15 1145  . oxyCODONE-acetaminophen (PERCOCET/ROXICET) 5-325 MG  per tablet 1 tablet  1 tablet Oral Q8H PRN Phillips Grout, MD   1 tablet at 04/18/15 1144  . pantoprazole (PROTONIX) EC tablet 40 mg  40 mg Oral BID Phillips Grout, MD   40 mg at 04/18/15 1144  . PARoxetine (PAXIL) tablet 10 mg  10 mg Oral Daily Phillips Grout, MD   10 mg at 04/18/15 1146  . polyethylene glycol (MIRALAX / GLYCOLAX) packet 17 g  17 g Oral Daily Orvil Feil, NP   17 g at 04/18/15 1144  . potassium chloride SA (K-DUR,KLOR-CON) CR tablet 20 mEq  20 mEq Oral Daily Phillips Grout, MD   20 mEq at 04/18/15 1143  . promethazine (PHENERGAN) suppository 25 mg  25 mg Rectal Q6H PRN Samuella Cota, MD   25 mg at 04/15/15 1113  . sodium chloride 0.9 % injection 3 mL  3 mL Intravenous Q12H Phillips Grout, MD   3 mL at 04/17/15 2330  . sodium chloride 0.9 % injection 3 mL  3 mL Intravenous PRN Phillips Grout, MD      . sucralfate (CARAFATE) 1 GM/10ML suspension 1 g  1 g Oral TID WC & HS Phillips Grout, MD   1 g at 04/18/15 1144  . torsemide (DEMADEX) tablet 20 mg  20 mg Oral Daily Orson Eva, MD   20 mg at 04/18/15 1143  . traMADol (ULTRAM) tablet 50 mg  50 mg Oral Q12H PRN Phillips Grout, MD   50 mg at 04/17/15 1814     Discharge Medications: Please see discharge summary for a list of discharge medications.  Relevant Imaging Results:  Relevant Lab Results:   Additional Information    Shalon Councilman, Clydene Pugh, LCSW

## 2015-04-18 NOTE — Plan of Care (Signed)
Problem: Acute Rehab PT Goals(only PT should resolve) Goal: Pt Will Go Supine/Side To Sit Pt will demonstrate ModI bed mobility supine to sitting edge-of-bed to return to PLOF and to decrease caregiver burden.     Goal: Patient Will Transfer Sit To/From Stand Pt will transfer sit to/from-stand with RW at ModI without loss-of-balance to demonstrate good safety awareness for independent mobility in home.     Goal: Pt Will Ambulate Pt will ambulate with RW at Supervision for distances greater than 39ft to demonstrate improve functional strength and activity tolerance.

## 2015-04-18 NOTE — Care Management Note (Addendum)
Case Management Note  Patient Details  Name: GIARA ZORA MRN: [id number] Date of Birth: 06/13/1950  Subjective/Objective:          To room with Nira Conn CSW to discuss with patient discharge plan. Patient  Agrees to SNF, Patient tearfully and nauseas during discussion. Patient expressed goal of becoming better so that she could return home.  Patient expressed tiredness of being sick.  Left room while CSW and patient continued conversation.       Action/Plan: Anticipate  SNF    Expected Discharge Date:                  Expected Discharge Plan:  [place]  In-House Referral:     Discharge planning Services  CM Consult  Post Acute Care Choice:    Choice offered to:     DME Arranged:    DME Agency:     HH Arranged:    [place] Agency:     Status of Service:  In process, will continue to follow  Medicare Important Message Given:    Date Medicare IM Given:    Medicare IM give by:    Date Additional Medicare IM Given:    Additional Medicare Important Message give by:     If discussed at [place] of Stay Meetings, dates discussed:  04/18/2015  Additional Comments:  Alvie Heidelberg, RN 04/18/2015, 2:39 PM

## 2015-04-18 NOTE — Progress Notes (Signed)
Subjective: Feels better this morning. No nausea or vomiting. No overt GI bleeding. Unsure when patient had last BM. Per Horris Latino, RN, patient had several episodes of abdominal pain without any exacerbating or relieving factors, accompanied by moaning and crying. Non-specific. Pain located in LUQ/epigastric region when occurs. Nursing states there is concern for dementia, and daughter of patient relayed this to nursing staff.   Objective: Vital signs in last 24 hours: Temp:  [97.5 F (36.4 C)-98 F (36.7 C)] 97.5 F (36.4 C) (01/24 0555) Pulse Rate:  [63-66] 66 (01/24 0555) Resp:  [16-20] 20 (01/24 0555) BP: (111-129)/(67-78) 129/78 mmHg (01/24 0555) SpO2:  [89 %-100 %] 97 % (01/24 0555) Weight:  [186 lb 1.1 oz (84.4 kg)] 186 lb 1.1 oz (84.4 kg) (01/24 0555) Last BM Date:  (Pt unsure ) General:   Alert and oriented to person, appears calmer than yesterday and no signs of discomfort  Head:  Normocephalic and atraumatic. Abdomen:  Bowel sounds present, soft, non-tender, non-distended. No HSM or hernias noted. No rebound or guarding. No masses appreciated  Neurologic:  Alert and  oriented to person Psych:  Alert and cooperative. Somewhat flat affect   Intake/Output from previous day: 01/23 0701 - 01/24 0700 In: 720 [P.O.:720] Out: 200 [Urine:200] Intake/Output this shift:    Lab Results:  Recent Labs  04/17/15 0621  WBC 3.2*  HGB 10.4*  HCT 31.0*  PLT 212   Lab Results  Component Value Date   IRON 43 04/17/2015   TIBC 259 04/17/2015   FERRITIN 310* 04/17/2015    BMET  Recent Labs  04/16/15 0443 04/17/15 0621  NA 135 134*  K 3.7 3.7  CL 100* 100*  CO2 26 26  GLUCOSE 72 93  BUN 30* 32*  CREATININE 2.99* 2.80*  CALCIUM 8.4* 8.3*   LFT  Recent Labs  04/17/15 0621  PROT 6.3*  ALBUMIN 2.6*  AST 93*  ALT 73*  ALKPHOS 68  BILITOT 0.6  BILIDIR 0.2  IBILI 0.4   Negative Hep B and C viral markers   Assessment: 65 year old female with multiple  comorbidities and extensive cardiac history, multiple prior hospitalizations for abdominal pain, history of GI bleed on Eliquis, now admitted with acute respiratory failure, pneumonia, acute on chronic systolic heart failure. GI consulted due to abdominal pain, which appears chronic in nature. Encouragingly, she had a negative EGD and colonoscopy in Jan 2017 in [place] by Dr. Oletta Lamas, and Eliquis remains on board due to history of afib. She has had no evidence of recent overt GI bleeding, and her Hgb is remaining in the 10 range, which is unchanged from last discharge. Anemia panel: elevated ferritin, non-specific in setting of acute illness. Normal iron. Suspect a chronic disease component, multifactorial presentation.   Abdominal pain: chronic in nature. Maximize constipation management. Unable to exclude pancreatic lesion as noted on CT, but further work-up via MRI is contraindicated due to defibrillator presence. Per discussion with radiology, CT without contrast could be repeated every 6 months. Gallbladder remains in situ but doubt biliary etiology. Mesenteric ischemia remaining in differential but again unable to evaluate further with imaging. Monitor clinically. Daughter has expressed concern to nursing staff regarding dementia. May need further work-up for this. Concern for psychiatric overlay as well.   Elevated LFTs: mildly elevated transaminases dating back to March 2016 with recent ultrasound noting a normal liver. Could have fatty liver disease with fluctuations of transaminases in the setting of acute illness. Hep C and B viral markers negative,  with other routine serologies pending. Repeat tomorrow.     Plan: Protonix BID Recheck H/H  HFP in am Scheduled Zofran Miralax daily  Serologies for elevated LFTs pending  Consider elastography as outpatient CT abdomen in 6 months to evaluate pancreatic vs renal lesion Follow-up with primary GI in [place] after discharge    Orvil Feil, ANP-BC [facility]     LOS: 5 days    04/18/2015, 8:09 AM

## 2015-04-18 NOTE — Progress Notes (Addendum)
PROGRESS NOTE  [name] Terry [id number] DOB: 1950/09/22 DOA: 04/13/2015 PCP: Pcp Not In System  Summary: 37 yof PMHx including STEMI 01/2015, diabetes, ischemic cardiomyopathy who was recently admitted 02/2015 for multiple reasons including abdominal pain nausea vomiting, that time thought to be secondary to abrupt stop of benzodiazepine, noncompliance with medication. At that time CT abdomen and pelvis was negative. She was admitted for 9 days in January, discharged 1/14 for GI bleed, saw GI at that time underwent EGD 1/9 without acute findings and a colonoscopy 1/13 without acute findings. She was restarted on Eliquis on discharge. She did require 2 units packed blood cells at that time. According to the chart she was [facility] from [facility] and went immediately to [facility] with complaints of shortness of breath, chest pain, abdominal pain, nausea, vomiting. She was admitted for acute on chronic systolic congestive heart failure as well as acute hypoxic respiratory failure. Heart failure rapidly stabilized by hypoxia persisted and imaging suggested pneumonia. She was treated with antibiotics with significant improvement. She is continued to have intermittent abdominal pain and has been evaluated by gastroenterology with recommendations for outpatient follow-up. Plan for discharge to skilled nursing facility next 48 hours.  Assessment/Plan: 1. Acute respiratory failure with hypoxia 2. Secondary to pneumonia, resolved. 3. Pneumonia. Appears resolved. Afebrile. 4. Intermittent abdominal pain, none currently. Etiology unclear. Previous investigation includes EGD, colonoscopy, 3 CT scans within the last 2 months. There may be a pancreatic mass on most recent CT although its significance is unclear. Recommendations for outpatient gastroenterology follow-up. 5. Elevated Lipase. Not clearly significant clinically. Now within normal limits. No pain. No evidence of pancreatitis at this  point. 6. Paroxysmal atrial fibrillation. CHADSVASc score of 5, continue amiodarone, carvedilol, and Eliquis 7. DM type 2 with neuropathy. Hgb A1C 5.8. stable.  8. CKD stage IV, stable.  9. Hypothyroidism, continue Synthroid. 10. Normocytic anemia. no bleeding. Stable.  suspect anemia of chronic kidney disease. 11. GERD, continue PPI twice a day.  12. PMH STEMI 01/2015 med management at [facility]; ischemic cardiomyopathy, chronic systolic congestive heart failure LVEF 35-40 percent; dual-chamber ICD, lymphoma 2009 13. 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 This might of enlarged over the past 10 months. unable to pursue MRI secondary to defibrillator in situ. Gastroenterology has discussed with radiology who has recommended repeat CT in 6 months.  14. Severe malnutrition in the context of chronic illness, nutrition following.    Overall improved. Tolerating diet. Hypoxia resolved. Consult PT for disposition recommendations.  Anticipate discharge today with outpatient GI follow up.  Consider elastography as outpatient  CT abdomen in 6 months to evaluate pancreatic vs renal lesion. keep outpatient follow-up with gastroenterologist in [place].   Code Status: Full DVT prophylaxis: Eliquis Family Communication: No family at bedside. Disposition Plan: Anticipate discharge within 24 hours.  Murray Hodgkins, MD  [facility]  Pager [phone] If 7PM-7AM, please contact night-coverage at [email or web address], password [facility] 04/18/2015, 7:12 AM  LOS: 5 days   Consultants:  GI  Procedures:  None   Antibiotics:  Azithromycin 1/21>>  Rocephin 1/21>>   HPI/Subjective: Was able to sleep and eat without difficulty. No reports of chest pain, shortness of breath, n/v/d, or abd pain. She is feeling better.   Objective: Filed Vitals:   04/17/15 0910 04/17/15 1518 04/17/15 2055 04/18/15 0555  BP:   126/68 111/67 129/78  Pulse:  65 63 66  Temp:  98 F (36.7  C) 97.8 F (36.6 C) 97.5 F (36.4 C)  TempSrc:  Oral Oral Oral  Resp:  16 20 20   Height:      Weight:    84.4 kg (186 lb 1.1 oz)  SpO2: 99% 100% 96% 97%    Intake/Output Summary (Last 24 hours) at 04/18/15 [id number] Last data filed at 04/17/15 2050  Gross per 24 hour  Intake    720 ml  Output    200 ml  Net    520 ml     Filed Weights   04/15/15 0632 04/17/15 0549 04/18/15 0555  Weight: 83.961 kg (185 lb 1.6 oz) 83.643 kg (184 lb 6.4 oz) 84.4 kg (186 lb 1.1 oz)    Exam:  VSS, afebrile, on [place]. General:  Appears comfortable, calm. Appears better today. Eyes: PERRL, normal lids, irises ENT: grossly normal hearing, lips, tongue Cardiovascular:  2/6 holosystolic murmur RUSB. No lower extremity edema Respiratory: Clear to auscultation bilaterally, no wheezes, rales or rhonchi. Normal respiratory effort. Abdomen: soft, ntnd Psychiatric: grossly normal mood and affect, speech fluent and appropriate. Oriented to self, place, and month.  Neurologic: grossly non-focal.  New data reviewed:  Blood sugars stable.   Lipase normal at 40.  Hemoglobin stable 9.8  Pertinent data since admission:  BNP 894  Lactic acid 2.88  Troponin 0.11   Scheduled Meds: . amiodarone  200 mg Oral BID  . apixaban  5 mg Oral BID  . aspirin EC  81 mg Oral Daily  . atorvastatin  40 mg Oral q1800  . azithromycin  500 mg Intravenous Q24H  . carvedilol  6.25 mg Oral BID WC  . cefTRIAXone (ROCEPHIN)  IV  1 g Intravenous Q24H  . feeding supplement  1 Container Oral BID BM  . insulin aspart  0-9 Units Subcutaneous TID WC  . isosorbide mononitrate  60 mg Oral Daily  . levothyroxine  50 mcg Oral QAC breakfast  . ondansetron  4 mg Oral TID AC & HS  . pantoprazole  40 mg Oral BID  . PARoxetine  10 mg Oral Daily  . polyethylene glycol  17 g Oral Daily  . potassium chloride SA  20 mEq Oral Daily  . sodium chloride  3 mL Intravenous Q12H  .  sucralfate  1 g Oral TID WC & HS  . torsemide  20 mg Oral Daily   Continuous Infusions:   Principal Problem:   Acute on chronic systolic (congestive) heart failure (HCC) Active Problems:   History of stroke   AICD (automatic cardioverter/defibrillator) present, dual chamber medtronic   Cardiomyopathy, ischemic-EF 35-40%   CAD S/P prior RCA PCI, cath 05/03/2013- med Rx, STEMI 01/2015 - 100% prox PDA, treated medically   CKD (chronic kidney disease) stage 3, GFR 30-59 ml/min   Acute respiratory failure with hypoxia (HCC)   Anxiousness   COPD (chronic obstructive pulmonary disease) ([place])   AP (abdominal pain)   Time Spent: 20 minutes    By signing my name below, I, Rennis Harding attest that this documentation has been prepared under the direction and in the presence of Murray Hodgkins, MD Electronically signed: Rennis Harding  04/18/2015    I personally performed the services described in this documentation. All medical record entries made by the scribe were at my direction. I have reviewed the chart and agree that the record reflects my personal performance and is accurate and complete. Murray Hodgkins, MD

## 2015-04-18 NOTE — Evaluation (Signed)
Physical Therapy Evaluation Patient Details Name: [name] Terry MRN: [id number] DOB: 09-Feb-1951 Today's Date: 04/18/2015   History of Present Illness  65yo black female, recently DC from [facility], and coming in to [facility] the next day c CP and SOB. Pt admitted for systolic congestive heart failure. PMH: CVA, EF 30%, CKD, GAD, COPD, DM, GIB, ICM, and STEMI in Nov 2016. PTA was performing HH aMB only, and receiving personal caregiver  2d/wk and PT services in home.   Clinical Impression  Pt received semirecumbent in bed asleep, but is aroused easily. Of note: multiple episodes of weak coughing with sputum productions during session: pt encouraged to cough more forcefully. Pt does not tolerate PT session well, citing 15/10 pain upon arrival that appears worse with changes in position. Pt demonstrating impairment of strength, balance, O2 perfusion, and activity tolerance, all limiting her ability to perform ADL at baseline level. Pt ambulates 50ft appearing very unsteady, with SaO2 at 85% on 1.5L. Pt will require skilled PT intervention and STR/SNF placement to address the above deficits and prepare pt for higher level of indep in ADL upon eventual DC to home.     Follow Up Recommendations SNF    Equipment Recommendations  None recommended by PT    Recommendations for Other Services       Precautions / Restrictions Precautions Precautions: None Restrictions Weight Bearing Restrictions: No      Mobility  Bed Mobility Overal bed mobility: Modified Independent                Transfers Overall transfer level: Needs assistance Equipment used: 1 person hand held assist Transfers: Sit to/from Stand Sit to Stand: Min guard            Ambulation/Gait Ambulation/Gait assistance: Min guard Ambulation Distance (Feet): 16 Feet Assistive device: None Gait Pattern/deviations: WFL(Within Functional Limits)   Gait velocity interpretation: <1.8 ft/sec, indicative of risk for  recurrent falls General Gait Details: very unstaedy, wide base gait, collapses back agaisnt the door halfway. SaO2 85% after short bout.   Stairs            Wheelchair Mobility    Modified Rankin (Stroke Patients Only)       Balance Overall balance assessment: Needs assistance Sitting-balance support: Bilateral upper extremity supported;Feet supported Sitting balance-Leahy Scale: Poor                                       Pertinent Vitals/Pain Pain Assessment: 0-10 Pain Score: 10-Worst pain ever Pain Location: LUQ from flank to sternum.  Pain Descriptors / Indicators: Aching Pain Intervention(s): Limited activity within patient's tolerance;Monitored during session;Repositioned    Home Living Family/patient expects to be discharged to:: Skilled nursing facility Living Arrangements: Alone               Additional Comments: no social support    Prior Function Level of Independence: Needs assistance   Gait / Transfers Assistance Needed: HH amb only   ADL's / Homemaking Assistance Needed: needs assistance; CG 2d/wk for bathing.         Hand Dominance   Dominant Hand: Right    Extremity/Trunk Assessment   Upper Extremity Assessment: Generalized weakness;Overall [facility] for tasks assessed           Lower Extremity Assessment: Generalized weakness;Overall [facility] for tasks assessed         Communication   Communication: No  difficulties  Cognition Arousal/Alertness: Lethargic Behavior During Therapy: WFL for tasks assessed/performed Overall Cognitive Status: Within Functional Limits for tasks assessed                      General Comments      Exercises        Assessment/Plan    PT Assessment Patient needs continued PT services  PT Diagnosis Difficulty walking;Generalized weakness;Acute pain   PT Problem List Decreased strength;Decreased activity tolerance;Decreased balance;Decreased mobility;Cardiopulmonary status  limiting activity;Decreased safety awareness;Pain  PT Treatment Interventions Gait training;Stair training;DME instruction;Balance training;Functional mobility training;Therapeutic activities;Therapeutic exercise;Patient/family education   PT Goals (Current goals can be found in the Care Plan section) Acute Rehab PT Goals Patient Stated Goal: regain strength, resolve pain.  PT Goal Formulation: With patient Time For Goal Achievement: 05/02/15 Potential to Achieve Goals: Fair    Frequency Min 3X/week   Barriers to discharge Inaccessible home environment;Decreased caregiver support      Co-evaluation               End of Session Equipment Utilized During Treatment: Gait belt Activity Tolerance: Patient tolerated treatment well;Patient limited by lethargy;Patient limited by pain Patient left: in bed;with call bell/phone within reach;with bed alarm set Nurse Communication: Other (comment)         [id number] PT Time Calculation (min) (ACUTE ONLY): 15 min   Charges:   PT Evaluation $PT Eval Moderate Complexity: 1 Procedure     PT G Codes:      2:41 PM, 05/18/2015 Etta Grandchild, PT, DPT PRN Physical Therapist at [place] License # [id number]  [id number] (wireless)  [phone] (mobile)

## 2015-04-18 NOTE — Clinical Social Work Note (Signed)
Clinical Social Work Assessment  Patient Details  Name: [name] Terry MRN: [id number] Date of Birth: December 19, 1950  Date of referral:  04/18/15               Reason for consult:  Facility Placement                Permission sought to share information with:    Permission granted to share information::     Name::        Agency::     Relationship::     Contact Information:     Housing/Transportation Living arrangements for the past 2 months:  Single Family Home Source of Information:  Patient, Adult Children Patient Interpreter Needed:  None Criminal Activity/Legal Involvement Pertinent to Current Situation/Hospitalization:  No - Comment as needed Significant Relationships:  Adult Children Lives with:  Self Do you feel safe going back to the place where you live?  Yes Need for family participation in patient care:  Yes (Comment)  Care giving concerns:  Patient has been having a difficult time caring for herself.    Social Worker assessment / plan:  Patient indicated that initially advised that her kids want her to go to a SNF but she felt that she could make it at home.  CSW discussed patient's current physical deconditioning as well as her current hospitalization and education patient on had rehab at a SNF would have a goal of assisting her in being physically reconditioned.  Patient stated that at baseline she ambulates unassisted. She stated that she has just started to receive home health services through [facility] to assist with ADLs.  Patient stated that her sister lives beside her and assists her when needed.  Patient cried throughout assessment. She stated that there is nothing in life for her to enjoy. CSW expored areas of possible enjoyment being in her children and grandchildren.  Patient agreed that her children and grandchildren were sources of joy but she was unable to interact with the as she would like due to being ill. Patient stated that she has had seven heart attacks  and they have taken a toll on her overall health.  Patient was agreeable to go to SNF and advised that she wanted to go to [facility], [place], [place] or [facility].  CSW spoke with patients daughter [name] Terry, who confirmed her mother's statements.  She stated that [facility] was the family's first choice.    Employment status:  Disabled (Comment on whether or not currently receiving Disability) Insurance information:  Medicare, Medicaid In [place] PT Recommendations:  [name] Terry / Referral to community resources:  [place]  Patient/Family's Response to care: Patient and family are agreeable to SNF.  Patient/Family's Understanding of and Emotional Response to Diagnosis, Current Treatment, and Prognosis: Patient is emotionally upset by her diagnosis, treatment and prognosis.     Emotional Assessment Appearance:  Appears stated age Attitude/Demeanor/Rapport:  Crying Affect (typically observed):  Anxious, Tearful/Crying Orientation:  Oriented to Self, Oriented to Place, Oriented to  Time, Oriented to Situation Alcohol / Substance use:  Not Applicable Psych involvement (Current and /or in the community):  No (Comment)  Discharge Needs  Concerns to be addressed:  Discharge Planning Concerns Readmission within the last 30 days:  Yes Current discharge risk:  Chronically ill, Lives alone Barriers to Discharge:  No Barriers Identified   [name], LCSW 04/18/2015, 12:24 PM

## 2015-04-19 DIAGNOSIS — J189 Pneumonia, unspecified organism: Secondary | ICD-10-CM

## 2015-04-19 DIAGNOSIS — I5023 Acute on chronic systolic (congestive) heart failure: Secondary | ICD-10-CM

## 2015-04-19 DIAGNOSIS — R1010 Upper abdominal pain, unspecified: Secondary | ICD-10-CM

## 2015-04-19 DIAGNOSIS — J9601 Acute respiratory failure with hypoxia: Secondary | ICD-10-CM

## 2015-04-19 DIAGNOSIS — N183 Chronic kidney disease, stage 3 (moderate): Secondary | ICD-10-CM

## 2015-04-19 DIAGNOSIS — R7989 Other specified abnormal findings of blood chemistry: Secondary | ICD-10-CM

## 2015-04-19 LAB — GLUCOSE, CAPILLARY
GLUCOSE-CAPILLARY: 119 mg/dL — AB (ref 65–99)
Glucose-Capillary: 97 mg/dL (ref 65–99)

## 2015-04-19 LAB — HEPATIC FUNCTION PANEL
ALK PHOS: 65 U/L (ref 38–126)
ALT: 58 U/L — AB (ref 14–54)
AST: 68 U/L — AB (ref 15–41)
Albumin: 2.5 g/dL — ABNORMAL LOW (ref 3.5–5.0)
BILIRUBIN INDIRECT: 0.7 mg/dL (ref 0.3–0.9)
Bilirubin, Direct: 0.2 mg/dL (ref 0.1–0.5)
TOTAL PROTEIN: 6.4 g/dL — AB (ref 6.5–8.1)
Total Bilirubin: 0.9 mg/dL (ref 0.3–1.2)

## 2015-04-19 MED ORDER — ALPRAZOLAM 0.25 MG PO TABS: 0.2500 mg | ORAL_TABLET | Freq: Three times a day (TID) | ORAL | Status: AC | PRN

## 2015-04-19 NOTE — Discharge Summary (Signed)
Physician Discharge Summary  [name] Terry [id number] DOB: 12/01/1950 DOA: 04/13/2015  PCP: Pcp Not In System  Admit date: 04/13/2015 Discharge date: 04/19/2015  Time spent: 35 minutes  Recommendations for Outpatient Follow-up:  1. Patient will be discharged to [facility].  2. Follow up CXR in 3-4 weeks to ensure resolution of PNA. 3. Follow up with Dr. Posey Pronto (GI) in [place] for CT abdomen in 6 months to evaluate pancreatic vs renal lesion. Consider repeat Anti-smooth muscle Ab in 3 months and monitor LFTs.   Discharge Diagnoses:  Principal Problem:   Acute on chronic systolic (congestive) heart failure (HCC) Active Problems:   History of stroke   AICD (automatic cardioverter/defibrillator) present, dual chamber medtronic   Cardiomyopathy, ischemic-EF 35-40%   CAD S/P prior RCA PCI, cath 05/03/2013- med Rx, STEMI 01/2015 - 100% prox PDA, treated medically   CKD (chronic kidney disease) stage 3, GFR 30-59 ml/min   Acute respiratory failure with hypoxia (HCC)   Anxiousness   COPD (chronic obstructive pulmonary disease) (HCC)   AP (abdominal pain)   Pneumonia   Discharge Condition: Improved  Diet recommendation: Heart healthy  Filed Weights   04/17/15 0549 04/18/15 0555 04/19/15 0649  Weight: 83.643 kg (184 lb 6.4 oz) 84.4 kg (186 lb 1.1 oz) 83.6 kg (184 lb 4.9 oz)    History of present illness:  16 yof PMHx including STEMI 01/2015, diabetes, ischemic cardiomyopathy who was recently admitted 02/2015 for multiple reasons including abdominal pain nausea vomiting, that time thought to be secondary to abrupt stop of benzodiazepine, noncompliance with medication. At that time CT abdomen and pelvis was negative. She was admitted for 9 days in January, discharged 1/14 for GI bleed, saw GI at that time underwent EGD 1/9 without acute findings and a colonoscopy 1/13 without acute findings. She was restarted on Eliquis on discharge. She did require 2 units packed blood cells at that time.  According to the chart she was discharged from [facility] 1/20 and went immediately to [facility] with complaints of shortness of breath, chest pain, abdominal pain, nausea, vomiting. She was admitted for acute on chronic systolic congestive heart failure as well as acute hypoxic respiratory failure.   Hospital Course:  Patient was admitted for acute respiratory failure with hypoxia felt to be secondary to acute on chronic systolic congestive heart failure. On admission she was placed on 90mg  of Lasix with resolution of her acute on chronic heart failure however CT Chest on 1/20 revealed PNA. She was started on IV abx with significant improvement and has since been weaned off of O2. She has completed 5 days of IV abx and is afebrile with a normal WBC. Respiratory status appears to be at baseline.  1. Intermittent abdominal pain, etiology unclear. Previous investigation includes EGD, colonoscopy, 3 CT scans within the last 2 months. There may be a pancreatic mass on most recent CT although its significance is unclear. Recommendations for outpatient gastroenterology follow-up. CT abdomen in 6 months to evaluate pancreatic vs renal lesion. Patient appears to be controlled at this time and she seems to be comfortable.  2. Elevated Lipase. Not clearly significant clinically. Now within normal limits. No pain. No evidence of pancreatitis at this point. 3. Paroxysmal atrial fibrillation. CHADSVASc score of 5, continue amiodarone, carvedilol, and Eliquis 4. DM type 2 with neuropathy. Hgb A1C 5.8. stable.  5. CKD stage IV, stable.  6. Hypothyroidism, continue Synthroid. 7. Normocytic anemia. no bleeding. Stable. suspect anemia of chronic kidney disease. 8. GERD, continue PPI twice  a day.  9. PMH STEMI 01/2015 med management at [facility]; ischemic cardiomyopathy, chronic systolic congestive heart failure LVEF 35-40 percent; dual-chamber ICD, lymphoma 2009 10. 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 This might have enlarged over the past 10 months. unable to pursue MRI secondary to defibrillator in situ. Gastroenterology has discussed with radiology who has recommended repeat CT in 6 months.  11. Severe malnutrition in the context of chronic illness, nutrition following.   Procedures:  none  Consultations:  GI  PT- SNF  Discharge Exam: Filed Vitals:   04/18/15 2206 04/19/15 0649  BP: 108/75 107/67  Pulse: 65 65  Temp: 98.6 F (37 C) 98.1 F (36.7 C)  Resp:  20     General: NAD, looks comfortable  Cardiovascular: RRR, S1, S2   Respiratory: clear bilaterally, No wheezing, rales or rhonchi  Abdomen: soft, non tender, no distention , bowel sounds normal  Musculoskeletal: No edema b/l   Discharge Instructions    Current Discharge Medication List    CONTINUE these medications which have NOT CHANGED   Details  ALPRAZolam (XANAX) 0.25 MG tablet Take 1-2 tablets (0.25-0.5 mg total) by mouth 3 (three) times daily as needed for anxiety. Qty: 30 tablet, Refills: 0    amiodarone (PACERONE) 200 MG tablet Take 1 tablet (200 mg total) by mouth 2 (two) times daily. Qty: 60 tablet, Refills: 1    apixaban (ELIQUIS) 5 MG TABS tablet Take 5 mg by mouth 2 (two) times daily.    aspirin EC 81 MG tablet Take 81 mg by mouth daily.    atorvastatin (LIPITOR) 40 MG tablet Take 1 tablet (40 mg total) by mouth daily at 6 PM. Qty: 90 tablet, Refills: 30   Associated Diagnoses: Cardiomyopathy, ischemic    carvedilol (COREG) 6.25 MG tablet Take 1 tablet (6.25 mg total) by mouth 2 (two) times daily with a meal. Qty: 60 tablet, Refills: 0    GLIPIZIDE XL 2.5 MG 24 hr tablet Take 2.5 mg by mouth daily as needed (blood sugar).     isosorbide mononitrate (IMDUR) 60 MG 24 hr tablet Take 1 tablet (60 mg total) by mouth daily. Qty: 30 tablet, Refills: 0    levothyroxine (SYNTHROID, LEVOTHROID) 50 MCG  tablet Take 1 tablet (50 mcg total) by mouth daily before breakfast. Qty: 60 tablet, Refills: 0    Multiple Vitamin (MULTI VITAMIN DAILY PO) Take 1 tablet by mouth daily.    ondansetron (ZOFRAN) 4 MG tablet Take 4 mg by mouth every 6 (six) hours as needed. Nausea Refills: 0    oxyCODONE-acetaminophen (PERCOCET/ROXICET) 5-325 MG tablet Take 1 tablet by mouth every 8 (eight) hours as needed for severe pain. Qty: 30 tablet, Refills: 0    pantoprazole (PROTONIX) 40 MG tablet Take 1 tablet (40 mg total) by mouth 2 (two) times daily. Qty: 60 tablet, Refills: 1    PARoxetine (PAXIL) 10 MG tablet Take 1 tablet (10 mg total) by mouth daily. Qty: 30 tablet, Refills: 0    potassium chloride SA (K-DUR,KLOR-CON) 20 MEQ tablet Take 20 mEq by mouth daily.    promethazine (PHENERGAN) 25 MG tablet Take 25 mg by mouth every 6 (six) hours as needed for nausea or vomiting.    sucralfate (CARAFATE) 1 GM/10ML suspension Take 10 mLs (1 g total) by mouth 4 (four) times daily -  with meals and at bedtime. Qty: 420 mL, Refills: 0    torsemide (DEMADEX)  20 MG tablet Take 1 tablet (20 mg total) by mouth daily. Qty: 30 tablet, Refills: 0    nitroGLYCERIN (NITROSTAT) 0.4 MG SL tablet Place 1 tablet (0.4 mg total) under the tongue every 5 (five) minutes as needed for chest pain. Qty: 25 tablet, Refills: 3    traMADol (ULTRAM) 50 MG tablet Take 1 tablet (50 mg total) by mouth every 12 (twelve) hours as needed for moderate pain or severe pain. Qty: 6 tablet, Refills: 0       Allergies  Allergen Reactions  . Flagyl [Metronidazole] Swelling and Rash    Face swells   . Contrast Media [Iodinated Diagnostic Agents] Rash  . Ioxaglate Rash  . Potassium Sulfate Rash and Other (See Comments)    Headaches  . Potassium-Containing Compounds Other (See Comments)    Headaches-- reports no problems now      The results of significant diagnostics from this hospitalization (including imaging, microbiology, ancillary  and laboratory) are listed below for reference.    Significant Diagnostic Studies: Ct Abdomen Pelvis Wo Contrast  03/31/2015  CLINICAL DATA:  Black stools since Christmas. Intermittent aching epigastric pain starting after Christmas, worsened with eating. Guaiac positive stools at the PCP office reportedly. Associated symptoms of shortness of breath, intermittent chest pain, hemoptysis and anxiety. EXAM: CT ABDOMEN AND PELVIS WITHOUT CONTRAST TECHNIQUE: Multidetector CT imaging of the abdomen and pelvis was performed following the standard protocol without IV contrast. COMPARISON:  CT abdomen and pelvis dated 03/10/2015. FINDINGS: The lung bases are now clear. Interval resolution of the small right pleural effusion seen on earlier CT. Cardiomegaly appears grossly stable, incompletely imaged. Bowel is normal in caliber. Scattered diverticulosis noted throughout the descending and sigmoid colon without evidence of acute diverticulitis. No bowel wall thickening or evidence of bowel wall inflammation identified. Appendix is normal. Stomach is unremarkable. No free fluid or abscess collection identified. No free intraperitoneal air. Liver, spleen, pancreas, gallbladder, and adrenal glands are within normal limits for a noncontrast study. Bilateral renal cysts again noted, difficult to definitively characterize without intravascular contrast, but stable compared to multiple prior studies. Left kidney is somewhat atrophic, stable. No renal stone or hydronephrosis. No ureteral or bladder calculi identified. Adrenal glands are unremarkable. Heavy atherosclerotic changes are seen along the walls of the normal-caliber abdominal aorta and branch vessels. Bilateral renal artery stents in place. Adnexal regions are unremarkable. Scattered degenerative changes are seen throughout the thoracolumbar spine but no acute osseous abnormality. Superficial soft tissues are unremarkable. IMPRESSION: 1. Colonic diverticulosis without  evidence of acute diverticulitis. 2. Overall, no evidence of acute intra-abdominal or intrapelvic abnormality identified. Chronic/incidental findings detailed above, including extensive atherosclerotic changes. Electronically Signed   By: Franki Cabot M.D.   On: 03/31/2015 17:50   Ct Chest Wo Contrast  04/14/2015  CLINICAL DATA:  Acute respiratory failure, query fluid over lung. Lymphoma, radiation and chemotherapy in 2009. EXAM: CT CHEST WITHOUT CONTRAST TECHNIQUE: Multidetector CT imaging of the chest was performed following the standard protocol without IV contrast. COMPARISON:  03/31/2015 and 04/13/2015 FINDINGS: Mediastinum/Nodes: Coronary, aortic arch, and branch vessel atherosclerotic vascular disease. Mild cardiomegaly is present. Defibrillator device noted. No pericardial effusion. No pathologic adenopathy. Lungs/Pleura: Patchy ground-glass opacities are present in the right lung, primarily centrally, and mainly conforming to secondary pulmonary lobular distribution, without overt nodularity. There is several faint ground-glass opacities in left upper lobe secondary pulmonary lobules although the appearance is clearly asymmetric. Upper abdomen: A 1.7 cm exophytic hypodense lesion from the left kidney upper pole  has an internal density of 3 Hounsfield units on image 59 series 4. There is a separate but but adjacent lesion more associated with the pancreatic duct pancreatic tail than the kidney, measuring 1.5 by 2.6 cm, and with calcifications along its anterior margin on image 57 series 4, potentially slightly enlarged from 06/15/2014. Musculoskeletal: Thoracic spondylosis. IMPRESSION: 1. Asymmetric patchy ground-glass opacities in the right lung compatible with asymmetric edema or perhaps resolving pneumonia. There is a trace right pleural effusion. If the patient is are risk for pulmonary embolus than ventilation perfusion lung scan or CT angiogram of the chest may be warranted. 2. There is a complex  lesion that seems to probably be arising from the tail the pancreas, with a cystic component and posterior calcification. This might of enlarged over the past 10 months. The patient's renal insufficiency does not allow for contrast-enhanced MRI. Consider non-contrast-enhanced MRI assessment of the pancreas for further characterization. Specifically this lesion it could be a postinflammatory cystic lesion of the pancreas or a small cystic neoplasm of the pancreatic tail. 3. Coronary, aortic arch, and branch vessel atherosclerotic vascular disease. Electronically Signed   By: Van Clines M.D.   On: 04/14/2015 12:51   Nm Pulmonary Perf And Vent  04/14/2015  CLINICAL DATA:  Shortness of breath, chest pain EXAM: NUCLEAR MEDICINE VENTILATION - PERFUSION LUNG SCAN TECHNIQUE: Ventilation images were obtained in multiple projections using inhaled aerosol Tc-22m DTPA. Perfusion images were obtained in multiple projections after intravenous injection of Tc-49m MAA. RADIOPHARMACEUTICALS:  0000000 millicuries [id number] DTPA aerosol inhalation and 4.1 millicuries [id number] MAA IV COMPARISON:  Chest x-ray 04/13/2015 FINDINGS: Ventilation: Patchy nonsegmental ventilation defects. Perfusion: Patchy nonsegmental perfusion defects. No segmental or subsegmental wedge defects. IMPRESSION: Low probability study for pulmonary embolus. Electronically Signed   By: Rolm Baptise M.D.   On: 04/14/2015 11:49   Dg Chest Portable 1 View  04/13/2015  CLINICAL DATA:  Acute onset of central chest pain and shortness of breath. Generalized abdominal pain and nausea. Initial encounter. EXAM: PORTABLE CHEST 1 VIEW COMPARISON:  Chest radiograph from 03/31/2015 FINDINGS: The lungs are well-aerated. Vascular congestion is noted. Patchy bilateral airspace opacities may reflect pulmonary edema or possibly pneumonia. No pleural effusion or pneumothorax is seen. The cardiomediastinal silhouette is mildly enlarged. A pacemaker/AICD is noted  overlying the left chest wall, with leads ending overlying the right atrium and right ventricle. No acute osseous abnormalities are seen. IMPRESSION: Vascular congestion and mild cardiomegaly. Patchy bilateral airspace opacities may reflect pulmonary edema or possibly pneumonia. Electronically Signed   By: Garald Balding M.D.   On: 04/13/2015 22:13   Dg Chest Port 1 View  03/31/2015  CLINICAL DATA:  Sudden onset of upper abdominal and chest pain. Nausea. EXAM: PORTABLE CHEST 1 VIEW COMPARISON:  03/08/2015 FINDINGS: Dual lead left-sided pacemaker remains in place. Cardiomegaly is again seen, unchanged. Improved bilateral opacities from prior exam without new confluent airspace disease. Mild vascular congestion. No large pleural effusion or pneumothorax. IMPRESSION: Improved aeration from prior exam with near complete resolution of previous bilateral opacities. Mild vascular congestion and stable cardiomegaly. Electronically Signed   By: Jeb Levering M.D.   On: 03/31/2015 02:48   Dg Abd Portable 1v  03/31/2015  CLINICAL DATA:  Acute onset of severe upper abdominal pain and generalized chest pain. Nausea. Initial encounter. EXAM: PORTABLE ABDOMEN - 1 VIEW COMPARISON:  CT of the abdomen and pelvis performed 03/10/2015 FINDINGS: The visualized bowel gas pattern is unremarkable. Scattered air and stool filled loops of colon  are seen; no abnormal dilatation of small bowel loops is seen to suggest small bowel obstruction. No free intra-abdominal air is identified, though evaluation for free air is limited on a single supine view. Mild degenerative change is noted at the lower lumbar spine; the sacroiliac joints are unremarkable in appearance. The visualized lung bases are essentially clear. Pacemaker/AICD leads are partially imaged. IMPRESSION: Unremarkable bowel gas pattern; no free intra-abdominal air seen. Small amount of stool noted in the colon. Electronically Signed   By: Garald Balding M.D.   On: 03/31/2015  02:46     Labs: Basic Metabolic Panel:  Recent Labs Lab 04/13/15 2236 04/14/15 0858 04/15/15 0558 04/16/15 0443 04/17/15 0621  NA 135 136 135 135 134*  K 5.3* 3.9 3.6 3.7 3.7  CL 98* 101 100* 100* 100*  CO2  --  25 28 26 26   GLUCOSE 127* 115* 101* 72 93  BUN 24* 19 26* 30* 32*  CREATININE 2.20* 2.21* 2.78* 2.99* 2.80*  CALCIUM  --  8.4* 8.5* 8.4* 8.3*   Liver Function Tests:  Recent Labs Lab 04/13/15 2215 04/17/15 0621 04/19/15 0547  AST 121* 93* 68*  ALT 106* 73* 58*  ALKPHOS 86 68 65  BILITOT 1.2 0.6 0.9  PROT 7.8 6.3* 6.4*  ALBUMIN 3.3* 2.6* 2.5*    Recent Labs Lab 04/17/15 0621 04/18/15 0600  LIPASE 42 40   CBC:  Recent Labs Lab 04/13/15 2215 04/13/15 2236 04/15/15 0558 04/17/15 0621 04/18/15 0700  WBC 6.2  --  3.8* 3.2*  --   NEUTROABS 4.2  --   --   --   --   HGB 12.7 13.3 10.5* 10.4* 9.8*  HCT 37.1 39.0 29.5* 31.0* 30.5*  MCV 97.1  --  98.3 96.0  --   PLT 228  --  196 212  --    Cardiac Enzymes:  Recent Labs Lab 04/14/15 0232 04/14/15 0833 04/14/15 1448  TROPONINI 0.11* 0.14* 0.11*   BNP: BNP (last 3 results)  Recent Labs  02/21/15 1042 03/01/15 0839 04/13/15 2215  BNP 386.1* 452.0* 894.0*    CBG:  Recent Labs Lab 04/18/15 0733 04/18/15 1140 04/18/15 1633 04/18/15 2205 04/19/15 0721  GLUCAP 92 117* 124* 122* 97     Signed: Wyonia Fontanella. MD [facility] 04/19/2015, 9:51 AM     By signing my name below, I, Rosalie Doctor, attest that this documentation has been prepared under the direction and in the presence of [facility]. MD Electronically Signed: Rosalie Doctor, Scribe.  04/19/2015  I, Dr. Kathie Dike, personally performed the services described in this documentaiton. All medical record entries made by the scribe were at my direction and in my presence. I have reviewed the chart and agree that the record reflects my personal performance and is accurate and complete  Kathie Dike, MD,  04/19/2015 1:24 PM

## 2015-04-19 NOTE — Care Management Important Message (Addendum)
Important Message  Patient Details  Name: REE OBERHOLZER MRN: [id number] Date of Birth: March 03, 1951   Medicare Important Message Given:  Yes    Alvie Heidelberg, RN 04/19/2015, 1:25 PM

## 2015-04-19 NOTE — Clinical Social Work Note (Signed)
CSW spoke with patient and advised of bed offers. Patient selected [facility].  CSW spoke with patient's daughter, Lorrin Mais, and advised of patient's selection of bed offers. She was agreeable. CSW discussed transportation to facility with [place].  She advised that patient's other daughter and sister were on their way to visit with patient and that transportation could be discussed with them.  Bryor Rami D, LCSW.  [phone]

## 2015-04-19 NOTE — Progress Notes (Signed)
Pt discharged with family.  They are taking the patient to [facility]. I called report to the nurse at [place] and removed pt's IV intact.

## 2015-04-19 NOTE — Progress Notes (Signed)
Subjective:  Feels better. Denies abdominal pain or nausea. Had BM this morning, somewhat hard but no evidence of melena or brbpr. Patient having difficulty recalling information and appears to stress her.  Objective: Vital signs in last 24 hours: Temp:  [97.3 F (36.3 C)-98.6 F (37 C)] 98.1 F (36.7 C) (01/25 0649) Pulse Rate:  [65-83] 65 (01/25 0649) Resp:  [18-20] 20 (01/25 0649) BP: (106-108)/(65-75) 107/67 mmHg (01/25 0649) SpO2:  [85 %-100 %] 100 % (01/25 0649) Weight:  [184 lb 4.9 oz (83.6 kg)] 184 lb 4.9 oz (83.6 kg) (01/25 0649) Last BM Date:  (per patient ) General:   Alert,  Well-developed, well-nourished, pleasant and cooperative in NAD Head:  Normocephalic and atraumatic. Eyes:  Sclera clear, no icterus.  Abdomen:  Soft, nontender and nondistended.  Normal bowel sounds, without guarding, and without rebound.   Extremities:  Without clubbing, deformity or edema. Neurologic:  Alert and  oriented x4;  grossly normal neurologically. Skin:  Intact without significant lesions or rashes. Psych:  Alert and cooperative. Flat affect.  Intake/Output from previous day: 01/24 0701 - 01/25 0700 In: 1362 [P.O.:1062; IV Piggyback:300] Out: 225 [Urine:225] Intake/Output this shift:    Lab Results: CBC  Recent Labs  04/17/15 0621 04/18/15 0700  WBC 3.2*  --   HGB 10.4* 9.8*  HCT 31.0* 30.5*  MCV 96.0  --   PLT 212  --    BMET  Recent Labs  04/17/15 0621  NA 134*  K 3.7  CL 100*  CO2 26  GLUCOSE 93  BUN 32*  CREATININE 2.80*  CALCIUM 8.3*   LFTs  Recent Labs  04/17/15 0621 04/19/15 0547  BILITOT 0.6 0.9  BILIDIR 0.2 0.2  IBILI 0.4 0.7  ALKPHOS 68 65  AST 93* 68*  ALT 73* 58*  PROT 6.3* 6.4*  ALBUMIN 2.6* 2.5*    Recent Labs  04/17/15 0621 04/18/15 0600  LIPASE 42 40   PT/INR No results for input(s): LABPROT, INR in the last 72 hours.    Imaging Studies: Ct Abdomen Pelvis Wo Contrast  03/31/2015  CLINICAL DATA:  Black stools since  Christmas. Intermittent aching epigastric pain starting after Christmas, worsened with eating. Guaiac positive stools at the PCP office reportedly. Associated symptoms of shortness of breath, intermittent chest pain, hemoptysis and anxiety. EXAM: CT ABDOMEN AND PELVIS WITHOUT CONTRAST TECHNIQUE: Multidetector CT imaging of the abdomen and pelvis was performed following the standard protocol without IV contrast. COMPARISON:  CT abdomen and pelvis dated 03/10/2015. FINDINGS: The lung bases are now clear. Interval resolution of the small right pleural effusion seen on earlier CT. Cardiomegaly appears grossly stable, incompletely imaged. Bowel is normal in caliber. Scattered diverticulosis noted throughout the descending and sigmoid colon without evidence of acute diverticulitis. No bowel wall thickening or evidence of bowel wall inflammation identified. Appendix is normal. Stomach is unremarkable. No free fluid or abscess collection identified. No free intraperitoneal air. Liver, spleen, pancreas, gallbladder, and adrenal glands are within normal limits for a noncontrast study. Bilateral renal cysts again noted, difficult to definitively characterize without intravascular contrast, but stable compared to multiple prior studies. Left kidney is somewhat atrophic, stable. No renal stone or hydronephrosis. No ureteral or bladder calculi identified. Adrenal glands are unremarkable. Heavy atherosclerotic changes are seen along the walls of the normal-caliber abdominal aorta and branch vessels. Bilateral renal artery stents in place. Adnexal regions are unremarkable. Scattered degenerative changes are seen throughout the thoracolumbar spine but no acute osseous abnormality. Superficial soft tissues are  unremarkable. IMPRESSION: 1. Colonic diverticulosis without evidence of acute diverticulitis. 2. Overall, no evidence of acute intra-abdominal or intrapelvic abnormality identified. Chronic/incidental findings detailed above,  including extensive atherosclerotic changes. Electronically Signed   By: Franki Cabot M.D.   On: 03/31/2015 17:50   Ct Chest Wo Contrast  04/14/2015  CLINICAL DATA:  Acute respiratory failure, query fluid over lung. Lymphoma, radiation and chemotherapy in 2009. EXAM: CT CHEST WITHOUT CONTRAST TECHNIQUE: Multidetector CT imaging of the chest was performed following the standard protocol without IV contrast. COMPARISON:  03/31/2015 and 04/13/2015 FINDINGS: Mediastinum/Nodes: Coronary, aortic arch, and branch vessel atherosclerotic vascular disease. Mild cardiomegaly is present. Defibrillator device noted. No pericardial effusion. No pathologic adenopathy. Lungs/Pleura: Patchy ground-glass opacities are present in the right lung, primarily centrally, and mainly conforming to secondary pulmonary lobular distribution, without overt nodularity. There is several faint ground-glass opacities in left upper lobe secondary pulmonary lobules although the appearance is clearly asymmetric. Upper abdomen: A 1.7 cm exophytic hypodense lesion from the left kidney upper pole has an internal density of 3 Hounsfield units on image 59 series 4. There is a separate but but adjacent lesion more associated with the pancreatic duct pancreatic tail than the kidney, measuring 1.5 by 2.6 cm, and with calcifications along its anterior margin on image 57 series 4, potentially slightly enlarged from 06/15/2014. Musculoskeletal: Thoracic spondylosis. IMPRESSION: 1. Asymmetric patchy ground-glass opacities in the right lung compatible with asymmetric edema or perhaps resolving pneumonia. There is a trace right pleural effusion. If the patient is are risk for pulmonary embolus than ventilation perfusion lung scan or CT angiogram of the chest may be warranted. 2. There is a complex lesion that seems to probably be arising from the tail the pancreas, with a cystic component and posterior calcification. This might of enlarged over the past 10  months. The patient's renal insufficiency does not allow for contrast-enhanced MRI. Consider non-contrast-enhanced MRI assessment of the pancreas for further characterization. Specifically this lesion it could be a postinflammatory cystic lesion of the pancreas or a small cystic neoplasm of the pancreatic tail. 3. Coronary, aortic arch, and branch vessel atherosclerotic vascular disease. Electronically Signed   By: Van Clines M.D.   On: 04/14/2015 12:51   Nm Pulmonary Perf And Vent  04/14/2015  CLINICAL DATA:  Shortness of breath, chest pain EXAM: NUCLEAR MEDICINE VENTILATION - PERFUSION LUNG SCAN TECHNIQUE: Ventilation images were obtained in multiple projections using inhaled aerosol Tc-91m DTPA. Perfusion images were obtained in multiple projections after intravenous injection of Tc-97m MAA. RADIOPHARMACEUTICALS:  0000000 millicuries [id number] DTPA aerosol inhalation and 4.1 millicuries [id number] MAA IV COMPARISON:  Chest x-ray 04/13/2015 FINDINGS: Ventilation: Patchy nonsegmental ventilation defects. Perfusion: Patchy nonsegmental perfusion defects. No segmental or subsegmental wedge defects. IMPRESSION: Low probability study for pulmonary embolus. Electronically Signed   By: Rolm Baptise M.D.   On: 04/14/2015 11:49   Dg Chest Portable 1 View  04/13/2015  CLINICAL DATA:  Acute onset of central chest pain and shortness of breath. Generalized abdominal pain and nausea. Initial encounter. EXAM: PORTABLE CHEST 1 VIEW COMPARISON:  Chest radiograph from 03/31/2015 FINDINGS: The lungs are well-aerated. Vascular congestion is noted. Patchy bilateral airspace opacities may reflect pulmonary edema or possibly pneumonia. No pleural effusion or pneumothorax is seen. The cardiomediastinal silhouette is mildly enlarged. A pacemaker/AICD is noted overlying the left chest wall, with leads ending overlying the right atrium and right ventricle. No acute osseous abnormalities are seen. IMPRESSION: Vascular  congestion and mild cardiomegaly. Patchy bilateral airspace opacities may  reflect pulmonary edema or possibly pneumonia. Electronically Signed   By: Garald Balding M.D.   On: 04/13/2015 22:13   Dg Chest Port 1 View  03/31/2015  CLINICAL DATA:  Sudden onset of upper abdominal and chest pain. Nausea. EXAM: PORTABLE CHEST 1 VIEW COMPARISON:  03/08/2015 FINDINGS: Dual lead left-sided pacemaker remains in place. Cardiomegaly is again seen, unchanged. Improved bilateral opacities from prior exam without new confluent airspace disease. Mild vascular congestion. No large pleural effusion or pneumothorax. IMPRESSION: Improved aeration from prior exam with near complete resolution of previous bilateral opacities. Mild vascular congestion and stable cardiomegaly. Electronically Signed   By: Jeb Levering M.D.   On: 03/31/2015 02:48   Dg Abd Portable 1v  03/31/2015  CLINICAL DATA:  Acute onset of severe upper abdominal pain and generalized chest pain. Nausea. Initial encounter. EXAM: PORTABLE ABDOMEN - 1 VIEW COMPARISON:  CT of the abdomen and pelvis performed 03/10/2015 FINDINGS: The visualized bowel gas pattern is unremarkable. Scattered air and stool filled loops of colon are seen; no abnormal dilatation of small bowel loops is seen to suggest small bowel obstruction. No free intra-abdominal air is identified, though evaluation for free air is limited on a single supine view. Mild degenerative change is noted at the lower lumbar spine; the sacroiliac joints are unremarkable in appearance. The visualized lung bases are essentially clear. Pacemaker/AICD leads are partially imaged. IMPRESSION: Unremarkable bowel gas pattern; no free intra-abdominal air seen. Small amount of stool noted in the colon. Electronically Signed   By: Garald Balding M.D.   On: 03/31/2015 02:46  [2 weeks]   Assessment:  65 year old female with multiple comorbidities and extensive cardiac history, multiple prior hospitalizations for  abdominal pain, history of GI bleed on Eliquis, now admitted with acute respiratory failure, pneumonia, acute on chronic systolic heart failure. GI consulted due to abdominal pain, which appears chronic in nature. Encouragingly, she had a negative EGD and colonoscopy in Jan 2017 in [place] by Dr. Oletta Lamas, and Eliquis remains on board due to history of afib. She has had no evidence of recent overt GI bleeding, and her Hgb is remaining in the 10 range, which is unchanged from last discharge. Anemia panel: elevated ferritin, non-specific in setting of acute illness. Normal iron. Suspect a chronic disease component, multifactorial presentation.   Abdominal pain: chronic in nature. Maximize constipation management. Unable to exclude pancreatic lesion as noted on CT, but further work-up via MRI is contraindicated due to defibrillator presence. Per discussion with radiology, CT without contrast could be repeated every 6 months. Gallbladder remains in situ but doubt biliary etiology. Mesenteric ischemia remaining in differential but again unable to evaluate further with imaging. Monitor clinically. Daughter has expressed concern to nursing staff regarding dementia. May need further work-up for this. Concern for psychiatric overlay as well.   Elevated LFTs: mildly elevated transaminases dating back to March 2016 with recent ultrasound noting a normal liver. Could have fatty liver disease with fluctuations of transaminases in the setting of acute illness. Hep C and B viral markers negative, with other routine serologies pending. AST/ALT with some improvement today. 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   Plan: 1. Continue Miralax daily. 2. Continue PPI BID. 3. CT abd in 6 months to evaluate pancreatic vs renal lesion 4. Consider repeat Anti-smooth muscle Ab in 3 months and monitor LFTs.  5. Follow-up with primary GI in [facility] after discharge (per patient, Dr. Posey Pronto?)  Laureen Ochs. Bernarda Caffey [facility] [phone] 1/25/20178:46 AM  LOS: 6 days

## 2015-04-19 NOTE — Clinical Social Work Placement (Signed)
   CLINICAL SOCIAL WORK PLACEMENT  NOTE  Date:  04/19/2015  Patient Details  Name: [name] Terry MRN: [id number] Date of Birth: 12-12-1950  Clinical Social Work is seeking post-discharge placement for this patient at the [place] level of care (*CSW will initial, date and re-position this form in  chart as items are completed):  Yes   Patient/family provided with [facility] Work Department's list of facilities offering this level of care within the geographic area requested by the patient (or if unable, by the patient's family).  Yes   Patient/family informed of their freedom to choose among providers that offer the needed level of care, that participate in Medicare, Medicaid or managed care program needed by the patient, have an available bed and are willing to accept the patient.  Yes   Patient/family informed of [facility]'s ownership interest in [facility] and [facility], as well as of the fact that they are under no obligation to receive care at these facilities.  PASRR submitted to EDS on 04/18/15     PASRR number received on 04/18/15     Existing PASRR number confirmed on       FL2 transmitted to all facilities in geographic area requested by pt/family on       FL2 transmitted to all facilities within larger geographic area on 04/18/15     Patient informed that his/her managed care company has contracts with or will negotiate with certain facilities, including the following:        Yes   Patient/family informed of bed offers received.  Patient chooses bed at [place] [facility]     Physician recommends and patient chooses bed at      Patient to be transferred to   on  .  Patient to be transferred to facility by       Patient family notified on   of transfer.  Name of family member notified:        PHYSICIAN       Additional Comment:    _______________________________________________ [name],  LCSW 04/19/2015, 12:13 PM

## 2015-04-20 ENCOUNTER — Emergency Department (HOSPITAL_COMMUNITY)
Admission: EM | Admit: 2015-04-20 | Discharge: 2015-04-20 | Disposition: A | Discharge status: Home / Self Care | Payer: Medicare Other | Attending: Emergency Medicine | Admitting: Emergency Medicine

## 2015-04-20 ENCOUNTER — Encounter (HOSPITAL_COMMUNITY): Payer: Self-pay

## 2015-04-20 ENCOUNTER — Emergency Department (HOSPITAL_COMMUNITY): Payer: Medicare Other

## 2015-04-20 DIAGNOSIS — Z79899 Other long term (current) drug therapy: Secondary | ICD-10-CM | POA: Diagnosis not present

## 2015-04-20 DIAGNOSIS — I5022 Chronic systolic (congestive) heart failure: Secondary | ICD-10-CM | POA: Insufficient documentation

## 2015-04-20 DIAGNOSIS — R112 Nausea with vomiting, unspecified: Secondary | ICD-10-CM | POA: Diagnosis not present

## 2015-04-20 DIAGNOSIS — Z7901 Long term (current) use of anticoagulants: Secondary | ICD-10-CM | POA: Diagnosis not present

## 2015-04-20 DIAGNOSIS — Z87891 Personal history of nicotine dependence: Secondary | ICD-10-CM | POA: Diagnosis not present

## 2015-04-20 DIAGNOSIS — Z8739 Personal history of other diseases of the musculoskeletal system and connective tissue: Secondary | ICD-10-CM | POA: Insufficient documentation

## 2015-04-20 DIAGNOSIS — K219 Gastro-esophageal reflux disease without esophagitis: Secondary | ICD-10-CM | POA: Insufficient documentation

## 2015-04-20 DIAGNOSIS — N183 Chronic kidney disease, stage 3 (moderate): Secondary | ICD-10-CM | POA: Insufficient documentation

## 2015-04-20 DIAGNOSIS — Z87448 Personal history of other diseases of urinary system: Secondary | ICD-10-CM | POA: Insufficient documentation

## 2015-04-20 DIAGNOSIS — Z9861 Coronary angioplasty status: Secondary | ICD-10-CM | POA: Diagnosis not present

## 2015-04-20 DIAGNOSIS — Y9389 Activity, other specified: Secondary | ICD-10-CM | POA: Diagnosis not present

## 2015-04-20 DIAGNOSIS — I2510 Atherosclerotic heart disease of native coronary artery without angina pectoris: Secondary | ICD-10-CM | POA: Diagnosis not present

## 2015-04-20 DIAGNOSIS — S4992XA Unspecified injury of left shoulder and upper arm, initial encounter: Secondary | ICD-10-CM | POA: Diagnosis present

## 2015-04-20 DIAGNOSIS — I129 Hypertensive chronic kidney disease with stage 1 through stage 4 chronic kidney disease, or unspecified chronic kidney disease: Secondary | ICD-10-CM | POA: Diagnosis not present

## 2015-04-20 DIAGNOSIS — F329 Major depressive disorder, single episode, unspecified: Secondary | ICD-10-CM | POA: Insufficient documentation

## 2015-04-20 DIAGNOSIS — Z8572 Personal history of non-Hodgkin lymphomas: Secondary | ICD-10-CM | POA: Diagnosis not present

## 2015-04-20 DIAGNOSIS — I480 Paroxysmal atrial fibrillation: Secondary | ICD-10-CM | POA: Diagnosis not present

## 2015-04-20 DIAGNOSIS — W19XXXA Unspecified fall, initial encounter: Secondary | ICD-10-CM

## 2015-04-20 DIAGNOSIS — Z8673 Personal history of transient ischemic attack (TIA), and cerebral infarction without residual deficits: Secondary | ICD-10-CM | POA: Insufficient documentation

## 2015-04-20 DIAGNOSIS — E119 Type 2 diabetes mellitus without complications: Secondary | ICD-10-CM | POA: Insufficient documentation

## 2015-04-20 DIAGNOSIS — S0990XA Unspecified injury of head, initial encounter: Secondary | ICD-10-CM | POA: Insufficient documentation

## 2015-04-20 DIAGNOSIS — E785 Hyperlipidemia, unspecified: Secondary | ICD-10-CM | POA: Diagnosis not present

## 2015-04-20 DIAGNOSIS — Y92009 Unspecified place in unspecified non-institutional (private) residence as the place of occurrence of the external cause: Secondary | ICD-10-CM | POA: Insufficient documentation

## 2015-04-20 DIAGNOSIS — F419 Anxiety disorder, unspecified: Secondary | ICD-10-CM | POA: Diagnosis not present

## 2015-04-20 DIAGNOSIS — Y998 Other external cause status: Secondary | ICD-10-CM | POA: Diagnosis not present

## 2015-04-20 DIAGNOSIS — Z9889 Other specified postprocedural states: Secondary | ICD-10-CM | POA: Diagnosis not present

## 2015-04-20 DIAGNOSIS — Z95810 Presence of automatic (implantable) cardiac defibrillator: Secondary | ICD-10-CM | POA: Insufficient documentation

## 2015-04-20 MED ORDER — OXYCODONE-ACETAMINOPHEN 5-325 MG PO TABS
1.0000 | ORAL_TABLET | Freq: Once | ORAL | Status: AC
  Administered 2015-04-20: 1 via ORAL
  Filled 2015-04-20: qty 1

## 2015-04-20 NOTE — ED Notes (Signed)
Per Nira Conn, [facility] don't discharge pt yet, she is working on new nursing facility placement.

## 2015-04-20 NOTE — ED Notes (Signed)
Pt reports is a resident at [facility] and was standing trying to pull her wheelchair out of the corner of the room and fell.  C/O pain in left shoulder, lower back, and head.  Pt is on eloquis  and asa.  Pt coughing.  Reports was recently treated for pneumonia.

## 2015-04-20 NOTE — Progress Notes (Signed)
2 gold hoop earrings 1 removed by patient 1 removed by me, placed in ziploc bag and given to patient.

## 2015-04-20 NOTE — ED Notes (Signed)
Per Nira Conn, CSW, pt is ready for transport via sister to [facility] [place].

## 2015-04-20 NOTE — ED Provider Notes (Signed)
CSN: [id number]     Arrival date & time 04/20/15  1207 History  By signing my name below, I, Rohini Rajnarayanan, attest that this documentation has been prepared under the direction and in the presence of No att. providers found Electronically Signed: Evonnie Dawes, ED Scribe 04/21/2015 at 12:37 PM.    Chief Complaint  Patient presents with  . Fall   The history is provided by the patient. No language interpreter was used.   HPI Comments: [name] Terry is a 65 y.o. female who presents to the Emergency Department complaining of fall that occurred prior to arrival; pt states she was trying to move her wheelchair at home, lost her balance and fell backward onto cement floor. She notes she fell and landed on her tailbone and while trying to catch herself hit her head and left shoulder. She reports associated pain to these areas, nausea and vomiting x1. Pt admits that shoulder pain is exacerbated with pressure. Pt notes she has a defibrillator and a pacemaker in place but denies any cardiac symptoms preceding the fall. She is currently taking anticoagulants due to DVT history.  Pt denies any arm, elbow, back or neck pain.   Past Medical History  Diagnosis Date  . Ischemic cardiomyopathy     a. s/p MDT dual chamber ICD implanted 2013 by Dr Westley Gambles at [facility], now followed by Dr Rayann Heman. b. LVEF 30-35% by echo 01/2015 at [facility] per report; 35-40% by echo 01/2015 at [facility].  Marland Kitchen History of stroke      a. 1993  . Coronary artery disease     a. s/p previous interventions in [place] per patient, no records available. b. cath 04/2013 with non-obstructive disease. c. STEMI 01/2015 @ [place] - cardiac cath had shown 100% stenosis of prox PDA, patent stent in RCA with otherwise normal coronary arteries.  . Lymphoma ([place])     a. s/p chemo/radiation 2009  . Chronic systolic CHF (congestive heart failure) ([place])   . HTN (hypertension)   . HLD (hyperlipidemia)   . Ventricular tachycardia ([place])     a. CL 300  msec requiring ICD shocks therpay 5/14 b. recurrent VT 04/2014, placed on amiodarone c. recurrent VT 05/2014 s/p ablation by Dr Rayann Heman d. s/p repeat ablation 07/2014  . DM2 (diabetes mellitus, type 2), newly diagnosed    . CKD (chronic kidney disease) stage 3, GFR 30-59 ml/min    . Polymyalgia rheumatica ([place])   . Depression   . GERD (gastroesophageal reflux disease)   . Renal atrophy, left     a. [place] 02/2015: "Left renal atrophy, likely from chronic renal arterial stenosis"  . Mitral regurgitation     a. mod by echo 01/2015.  . Diverticulosis     a. By CT 02/2015.  Marland Kitchen PAF (paroxysmal atrial fibrillation) ([place])     a. Brief paroxysms during admission 02/2015 - amiodarone increased. Not placed on anticoag due to dual antiplatelet therapy and bleeding risk including in the setting of possible GI pathology.   . Hiatal hernia     a. by EGD 06/2014.  Marland Kitchen Gastritis     a. by EGD 06/2014.  Marland Kitchen Anxiousness 03/02/2015   Past Surgical History  Procedure Laterality Date  . Cardiac defibrillator placement  03/2011    MDT ICD implanted at [facility] by Dr Tana Coast  . Coronary angioplasty with stent placement    . Left and right heart catheterization with coronary angiogram N/A 05/03/2013    non-obstructive CAD  . V-tach ablation N/A 05/26/2014  VT ablation by Dr Rayann Heman - PVCs arising from the inferolateral LV, extensive substrate ablation  . Electrophysiologic study N/A 08/04/2014    Procedure: V Tach Ablation;  Surgeon: Thompson Grayer, MD;  Location: [place] [facility];  Service: Cardiovascular;  Laterality: N/A;  . Esophagogastroduodenoscopy (egd) with propofol N/A 04/03/2015    Dr. Oletta Lamas: normal  . Colonoscopy N/A 04/07/2015    Dr. Oletta Lamas: diverticulosis, internal hemorrhoids   . Pacemaker/defibrillator     Family History  Problem Relation Age of Onset  . Diabetes Father   . Hypertension Father   . Heart disease Father   . Alcoholism Father   . Hypertension Mother   . Heart disease Mother   . Breast cancer  Mother   . Stroke Mother   . Diabetes Brother   . Diabetes Brother   . Diabetes Brother   . Diabetes Brother   . Hypertension Sister   . Heart disease Sister   . Alcoholism Sister   . Thyroid disease Sister   . Heart attack Sister   . Heart attack Brother   . Stroke Brother   . Colon cancer Neg Hx    Social History  Substance Use Topics  . Smoking status: Former Smoker -- 0.25 packs/day    Quit date: 05/20/2014  . Smokeless tobacco: Never Used     Comment: she is not ready to quit  . Alcohol Use: No   OB History    No data available     Review of Systems  Musculoskeletal: Positive for arthralgias.  All other systems reviewed and are negative.     Allergies  Flagyl; Contrast media; Ioxaglate; Potassium sulfate; and Potassium-containing compounds  Home Medications   Prior to Admission medications   Medication Sig Start Date End Date Taking? Authorizing Provider  ALPRAZolam (XANAX) 0.25 MG tablet Take 1-2 tablets (0.25-0.5 mg total) by mouth 3 (three) times daily as needed for anxiety. 04/19/15  Yes Kathie Dike, MD  apixaban (ELIQUIS) 5 MG TABS tablet Take 5 mg by mouth 2 (two) times daily.   Yes Historical Provider, MD  atorvastatin (LIPITOR) 40 MG tablet Take 1 tablet (40 mg total) by mouth daily at 6 PM. 06/20/14  Yes Thompson Grayer, MD  carvedilol (COREG) 6.25 MG tablet Take 1 tablet (6.25 mg total) by mouth 2 (two) times daily with a meal. 04/08/15  Yes Costin Karlyne Greenspan, MD  isosorbide mononitrate (IMDUR) 60 MG 24 hr tablet Take 1 tablet (60 mg total) by mouth daily. 03/12/15  Yes Allie Bossier, MD  levothyroxine (SYNTHROID, LEVOTHROID) 50 MCG tablet Take 1 tablet (50 mcg total) by mouth daily before breakfast. 03/12/15  Yes Allie Bossier, MD  Multiple Vitamin (MULTI VITAMIN DAILY PO) Take 1 tablet by mouth daily.   Yes Historical Provider, MD  nitroGLYCERIN (NITROSTAT) 0.4 MG SL tablet Place 1 tablet (0.4 mg total) under the tongue every 5 (five) minutes as needed  for chest pain. 07/04/14  Yes Amber Sena Slate, NP  ondansetron (ZOFRAN) 4 MG tablet Take 4 mg by mouth every 6 (six) hours as needed. Nausea 03/18/15  Yes Historical Provider, MD  oxyCODONE-acetaminophen (PERCOCET/ROXICET) 5-325 MG tablet Take 1 tablet by mouth every 8 (eight) hours as needed for severe pain. 04/08/15  Yes Costin Karlyne Greenspan, MD  pantoprazole (PROTONIX) 40 MG tablet Take 1 tablet (40 mg total) by mouth 2 (two) times daily. 02/28/15  Yes Dayna N Dunn, PA-C  PARoxetine (PAXIL) 10 MG tablet Take 1 tablet (10 mg total)  by mouth daily. 03/12/15  Yes Allie Bossier, MD  potassium chloride SA (K-DUR,KLOR-CON) 20 MEQ tablet Take 20 mEq by mouth daily.   Yes Historical Provider, MD  senna-docusate (SENOKOT-S) 8.6-50 MG tablet Take 1 tablet by mouth 2 (two) times daily.   Yes Historical Provider, MD  sucralfate (CARAFATE) 1 GM/10ML suspension Take 10 mLs (1 g total) by mouth 4 (four) times daily -  with meals and at bedtime. 02/28/15  Yes Dayna N Dunn, PA-C  torsemide (DEMADEX) 20 MG tablet Take 1 tablet (20 mg total) by mouth daily. 03/12/15  Yes Allie Bossier, MD  amiodarone (PACERONE) 200 MG tablet Take 1 tablet (200 mg total) by mouth 2 (two) times daily. Patient not taking: Reported on 04/20/2015 02/28/15   Dayna N Dunn, PA-C  traMADol (ULTRAM) 50 MG tablet Take 1 tablet (50 mg total) by mouth every 12 (twelve) hours as needed for moderate pain or severe pain. Patient not taking: Reported on 04/20/2015 02/28/15   Dayna N Dunn, PA-C   BP 128/94 mmHg  Pulse 72  Temp(Src) 98 F (36.7 C) (Oral)  Resp 14  SpO2 100% Physical Exam  Constitutional: She is oriented to person, place, and time. She appears well-developed and well-nourished. No distress.  HENT:  Head: Normocephalic and atraumatic.  No trauma to back of head   Eyes: Conjunctivae and EOM are normal.  Neck: Neck supple. No tracheal deviation present.  Cardiovascular: Normal rate, regular rhythm and normal heart sounds.    Pulmonary/Chest: Effort normal and breath sounds normal. No respiratory distress. She exhibits no tenderness.  No tenderness to chest  Abdominal: There is no tenderness.  Musculoskeletal: Normal range of motion.  Tenderness posterior left shoulder and pain with ROM.  No tenderness to lower extremities or elsewhere No cervical tenderness or midline back tenderness  Neurological: She is alert and oriented to person, place, and time.  Skin: Skin is warm and dry.  Psychiatric: She has a normal mood and affect. Her behavior is normal.  Nursing note and vitals reviewed.   ED Course  Procedures (including critical care time) DIAGNOSTIC STUDIES: Oxygen Saturation is 100% on RA, Normal by my interpretation.    COORDINATION OF CARE: 7:34 AM-Discussed treatment plan which includes EKG and arm x-ray and head CT with pt at bedside and pt agreed to plan.   Labs Review Labs Reviewed - No data to display  Imaging Review Ct Head Wo Contrast  04/20/2015  CLINICAL DATA:  Pain following fall EXAM: CT HEAD WITHOUT CONTRAST CT CERVICAL SPINE WITHOUT CONTRAST TECHNIQUE: Multidetector CT imaging of the head and cervical spine was performed following the standard protocol without intravenous contrast. Multiplanar CT image reconstructions of the cervical spine were also generated. COMPARISON:  Head CT July 19, 2012 FINDINGS: CT HEAD FINDINGS There is age related volume loss. There is no intracranial mass, hemorrhage, extra-axial fluid collection, or midline shift. There is evidence of a prior infarct in the posterior right frontal lobe. There is evidence of a prior infarct in the medial left thalamus. Elsewhere, gray-white compartments appear normal. No acute infarct evident. The bony calvarium appears intact. The mastoid air cells are clear. No intraorbital lesions are apparent. CT CERVICAL SPINE FINDINGS There is no demonstrable fracture or spondylolisthesis. Prevertebral soft tissues and predental space  regions are normal. The disc spaces appear normal. There is a prominent anterior osteophyte along the inferior anterior aspect of the C4 vertebral body. There is facet hypertrophy at multiple levels, fairly mild. No disc  extrusion or stenosis. No nerve root edema or effacement. There are several unfused apophyses along several spinous processes posteriorly. There are foci of calcification in both carotid arteries. There is fibrotic type change in the right apex regions. IMPRESSION: Prior infarcts in the posterior aspect of the right frontal lobe and medial left thalamus. No acute infarct evident. No intracranial mass, hemorrhage, or extra-axial fluid collection. CT cervical spine: No fracture or spondylolisthesis. Areas of osteoarthritic change. No disc extrusion or stenosis evident. There is bilateral carotid artery calcification. Fibrotic type changes noted in the right lung apex. Electronically Signed   By: Lowella Grip III M.D.   On: 04/20/2015 13:53   Ct Cervical Spine Wo Contrast  04/20/2015  CLINICAL DATA:  Pain following fall EXAM: CT HEAD WITHOUT CONTRAST CT CERVICAL SPINE WITHOUT CONTRAST TECHNIQUE: Multidetector CT imaging of the head and cervical spine was performed following the standard protocol without intravenous contrast. Multiplanar CT image reconstructions of the cervical spine were also generated. COMPARISON:  Head CT July 19, 2012 FINDINGS: CT HEAD FINDINGS There is age related volume loss. There is no intracranial mass, hemorrhage, extra-axial fluid collection, or midline shift. There is evidence of a prior infarct in the posterior right frontal lobe. There is evidence of a prior infarct in the medial left thalamus. Elsewhere, gray-white compartments appear normal. No acute infarct evident. The bony calvarium appears intact. The mastoid air cells are clear. No intraorbital lesions are apparent. CT CERVICAL SPINE FINDINGS There is no demonstrable fracture or spondylolisthesis.  Prevertebral soft tissues and predental space regions are normal. The disc spaces appear normal. There is a prominent anterior osteophyte along the inferior anterior aspect of the C4 vertebral body. There is facet hypertrophy at multiple levels, fairly mild. No disc extrusion or stenosis. No nerve root edema or effacement. There are several unfused apophyses along several spinous processes posteriorly. There are foci of calcification in both carotid arteries. There is fibrotic type change in the right apex regions. IMPRESSION: Prior infarcts in the posterior aspect of the right frontal lobe and medial left thalamus. No acute infarct evident. No intracranial mass, hemorrhage, or extra-axial fluid collection. CT cervical spine: No fracture or spondylolisthesis. Areas of osteoarthritic change. No disc extrusion or stenosis evident. There is bilateral carotid artery calcification. Fibrotic type changes noted in the right lung apex. Electronically Signed   By: Lowella Grip III M.D.   On: 04/20/2015 13:53   Dg Shoulder Left  04/20/2015  CLINICAL DATA:  Fall, left shoulder pain EXAM: LEFT SHOULDER - 2+ VIEW COMPARISON:  None. FINDINGS: Three views of the left shoulder submitted. No acute fracture or subluxation. Partially visualized 2 leads cardiac pacemaker. Mild degenerative changes AC joint. Glenohumeral joint is preserved. IMPRESSION: No acute fracture or subluxation. Mild degenerative changes AC joint. Electronically Signed   By: Lahoma Crocker M.D.   On: 04/20/2015 14:03   I have personally reviewed and evaluated these images and lab results as part of my medical decision-making.   EKG Interpretation   Date/Time:  Thursday April 20 2015 12:14:22 EST Ventricular Rate:  71 PR Interval:  191 QRS Duration: 106 QT Interval:  526 QTC Calculation: 572 R Axis:   -2 Text Interpretation:  Sinus rhythm Low voltage, precordial leads  Borderline abnrm T, anterolateral leads Prolonged QT interval Confirmed by   [facility] MD, Corene Cornea [phone]) on 04/20/2015 12:40:28 PM      MDM   Final diagnoses:  Fall, initial encounter    65 year old female with a fall secondary  to weakness while at a skilled nursing facility. Symptoms as described above. Labs and imaging unremarkable. Patient requested to not be discharged back to the same facility so social work was consult it to help with placement elsewhere and was successful in doing so.   I personally performed the services described in this documentation, which was scribed in my presence. The recorded information has been reviewed and is accurate.      Merrily Pew, MD 04/21/15 [phone]

## 2015-04-20 NOTE — NC FL2 (Signed)
[place] LEVEL OF CARE SCREENING TOOL     IDENTIFICATION  Patient Name: [name] Terry Birthdate: 1951-02-04 Sex: female Admission Date (Current Location): 04/20/2015  [facility] [place] Number:      Facility and Address:         Provider Number:    Attending Physician Name and Address:  Merrily Pew, MD  Relative Name and Phone Number:       Current Level of Care:   Recommended Level of Care:   Prior Approval Number:    Date Approved/Denied:   PASRR Number:    Discharge Plan:      Current Diagnoses: Patient Active Problem List   Diagnosis Date Noted  . Pneumonia   . AP (abdominal pain)   . COPD (chronic obstructive pulmonary disease) ([place]) 04/14/2015  . Hypothyroidism 03/31/2015  . GI bleed 03/30/2015  . Generalized abdominal pain   . Gastroesophageal reflux disease with esophagitis   . Hiatal hernia   . Controlled type 2 diabetes mellitus with stage 4 chronic kidney disease ([place])   . Noncompliance with medication regimen   . Persistent atrial fibrillation ([place])   . Abdominal pain, left upper quadrant   . CAD in native artery   . Hypokalemia   . Paroxysmal atrial fibrillation (H[facility])   . Essential hypertension   . Hematemesis with nausea   . Hemoptysis   . Nausea with vomiting   . Other specified hypothyroidism   . Chronic kidney disease (CKD), stage IV (severe) ([place])   . Anxiety state   . Acute respiratory failure with hypoxemia ([place])   . Respiratory failure ([place])   . GERD (gastroesophageal reflux disease) 03/06/2015  . Acute pulmonary edema (H[facility])   . Acute on chronic combined systolic and diastolic congestive heart failure, NYHA class 4 ([place])   . Acute on chronic respiratory failure with hypoxia and hypercapnia (H[facility])   . Acute bronchitis 03/05/2015  . Angina at rest [facility]) 03/04/2015  . Cough with hemoptysis 03/04/2015  . Diabetes mellitus type 2 in obese ([place]) 03/04/2015  . Chest pain 03/03/2015  . Anxiousness 03/02/2015  . Acute  on chronic systolic CHF (congestive heart failure) ([place]) 03/01/2015  . Acute respiratory failure with hypoxia ([place]) 03/01/2015  . PAF (paroxysmal atrial fibrillation) ([place]) 02/28/2015  . Abdominal pain - possible recurrent gastritis 02/28/2015  . Diverticulosis 02/28/2015  . Mitral regurgitation 02/28/2015  . Abnormal serum level of amylase 02/28/2015  . Abnormal LFTs 02/28/2015  . Leukocytosis 02/28/2015  . Abnormal TSH 02/28/2015  . Elevated troponin 02/20/2015  . Unstable angina ([place])   . Acute on chronic systolic (congestive) heart failure ([place])   . VT (ventricular tachycardia) ([place]) 08/04/2014  . Gout 03/23/2014  . DM2 with nephropathy 05/05/2013  . CKD (chronic kidney disease) stage 3, GFR 30-59 ml/min 05/05/2013  . Ventricular tachycardia ([place]) 11/06/2012  . Cardiomyopathy, ischemic-EF 35-40% 11/06/2012  . CAD S/P prior RCA PCI, cath 05/03/2013- med Rx, STEMI 01/2015 - 100% prox PDA, treated medically 11/06/2012  . AICD (automatic cardioverter/defibrillator) present, dual chamber medtronic 10/21/2012  . History of stroke   . Myocardial infarction ([place])   . Lymphoma ([place])   . CHF (congestive heart failure) ([place])   . HTN (hypertension)   . HLD (hyperlipidemia)   . Kidney disease   . Alopecia 05/19/2012  . Lichenification and lichen simplex chronicus 12/05/2011    Orientation RESPIRATION BLADDER Height & Weight  BEHAVIORAL SYMPTOMS/MOOD NEUROLOGICAL BOWEL NUTRITION STATUS           AMBULATORY STATUS COMMUNICATION OF NEEDS Skin                               Personal Care Assistance Level of Assistance              Functional Limitations Info             SPECIAL CARE FACTORS FREQUENCY                       Contractures      Additional Factors Info                  Current Medications (04/20/2015):  This is the current hospital active medication list No current facility-administered medications for this encounter.    Current Outpatient Prescriptions  Medication Sig Dispense Refill  . ALPRAZolam (XANAX) 0.25 MG tablet Take 1-2 tablets (0.25-0.5 mg total) by mouth 3 (three) times daily as needed for anxiety. 30 tablet 0  . apixaban (ELIQUIS) 5 MG TABS tablet Take 5 mg by mouth 2 (two) times daily.    Marland Kitchen atorvastatin (LIPITOR) 40 MG tablet Take 1 tablet (40 mg total) by mouth daily at 6 PM. 90 tablet 30  . carvedilol (COREG) 6.25 MG tablet Take 1 tablet (6.25 mg total) by mouth 2 (two) times daily with a meal. 60 tablet 0  . isosorbide mononitrate (IMDUR) 60 MG 24 hr tablet Take 1 tablet (60 mg total) by mouth daily. 30 tablet 0  . levothyroxine (SYNTHROID, LEVOTHROID) 50 MCG tablet Take 1 tablet (50 mcg total) by mouth daily before breakfast. 60 tablet 0  . Multiple Vitamin (MULTI VITAMIN DAILY PO) Take 1 tablet by mouth daily.    . nitroGLYCERIN (NITROSTAT) 0.4 MG SL tablet Place 1 tablet (0.4 mg total) under the tongue every 5 (five) minutes as needed for chest pain. 25 tablet 3  . ondansetron (ZOFRAN) 4 MG tablet Take 4 mg by mouth every 6 (six) hours as needed. Nausea  0  . oxyCODONE-acetaminophen (PERCOCET/ROXICET) 5-325 MG tablet Take 1 tablet by mouth every 8 (eight) hours as needed for severe pain. 30 tablet 0  . pantoprazole (PROTONIX) 40 MG tablet Take 1 tablet (40 mg total) by mouth 2 (two) times daily. 60 tablet 1  . PARoxetine (PAXIL) 10 MG tablet Take 1 tablet (10 mg total) by mouth daily. 30 tablet 0  . potassium chloride SA (K-DUR,KLOR-CON) 20 MEQ tablet Take 20 mEq by mouth daily.    Marland Kitchen senna-docusate (SENOKOT-S) 8.6-50 MG tablet Take 1 tablet by mouth 2 (two) times daily.    . sucralfate (CARAFATE) 1 GM/10ML suspension Take 10 mLs (1 g total) by mouth 4 (four) times daily -  with meals and at bedtime. 420 mL 0  . torsemide (DEMADEX) 20 MG tablet Take 1 tablet (20 mg total) by mouth daily. 30 tablet 0  . amiodarone (PACERONE) 200 MG tablet Take 1 tablet (200 mg total) by mouth 2 (two) times  daily. (Patient not taking: Reported on 04/20/2015) 60 tablet 1  . traMADol (ULTRAM) 50 MG tablet Take 1 tablet (50 mg total) by mouth every 12 (twelve) hours as needed for moderate pain or severe pain. (Patient not taking: Reported on 04/20/2015) 6 tablet 0     Discharge Medications: Please see discharge summary for a list of discharge medications.  Relevant Imaging Results:  Relevant Lab Results:   Additional Information    Indiyah Paone, Clydene Pugh, LCSW

## 2015-04-20 NOTE — Clinical Social Work Note (Signed)
CSW facilitated admission to [name] Terry at patient and daughter, Tiemba's request, as they were not happy with [facility].    [name], [place] [phone]

## 2015-04-28 NOTE — Progress Notes (Signed)
No ICM transmission received for 04/20/2015 due to patient is in hospital.  Will reschedule after she is discharged.

## 2015-05-01 ENCOUNTER — Ambulatory Visit: Payer: Medicare Other | Admitting: Internal Medicine

## 2015-05-16 ENCOUNTER — Telehealth: Payer: Self-pay

## 2015-05-16 ENCOUNTER — Encounter (HOSPITAL_COMMUNITY): Payer: Medicare Other

## 2015-05-16 NOTE — Telephone Encounter (Signed)
ICM call to patient.   She stated she is still in the hospital and not feeling well.  Unsure of discharge, could be in the next couple of days.  She plans on going to rehab at discharge.  Advised will call back to set up ICM remote transmission when she gets home.

## 2015-05-25 ENCOUNTER — Ambulatory Visit: Payer: Medicare Other | Admitting: Physician Assistant

## 2015-06-20 ENCOUNTER — Telehealth: Payer: Self-pay | Admitting: Cardiology

## 2015-06-20 NOTE — Telephone Encounter (Signed)
Attempted to confirm remote transmission with pt. No answer and was unable to leave a message.   

## 2015-06-20 NOTE — Progress Notes (Signed)
No ICM transmission received.  Patient in SNF.  Patient was advised today by Garlon Hatchet, CMA to have her monitor brought to SNF.   Next ICM transmission scheduled for 07/05/2015.

## 2015-06-20 NOTE — Telephone Encounter (Signed)
Pt informed me that she was in the hospital and she is not sure when she will be coming home. Instructed pt that she can have a family member bring her home monitor to her and she can send the transmission from there. Pt verbalized understanding.

## 2015-06-26 ENCOUNTER — Emergency Department (HOSPITAL_COMMUNITY): Payer: Medicare Other

## 2015-06-26 ENCOUNTER — Telehealth: Payer: Self-pay | Admitting: Internal Medicine

## 2015-06-26 ENCOUNTER — Encounter (HOSPITAL_COMMUNITY): Payer: Self-pay | Admitting: Nurse Practitioner

## 2015-06-26 ENCOUNTER — Inpatient Hospital Stay (HOSPITAL_COMMUNITY)
Admission: EM | Admit: 2015-06-26 | Discharge: 2015-07-24 | DRG: 264 | Disposition: E | Payer: Medicare Other | Attending: Pulmonary Disease | Admitting: Pulmonary Disease

## 2015-06-26 DIAGNOSIS — I5043 Acute on chronic combined systolic (congestive) and diastolic (congestive) heart failure: Secondary | ICD-10-CM | POA: Diagnosis present

## 2015-06-26 DIAGNOSIS — I472 Ventricular tachycardia, unspecified: Secondary | ICD-10-CM

## 2015-06-26 DIAGNOSIS — E876 Hypokalemia: Secondary | ICD-10-CM | POA: Diagnosis present

## 2015-06-26 DIAGNOSIS — J9690 Respiratory failure, unspecified, unspecified whether with hypoxia or hypercapnia: Secondary | ICD-10-CM

## 2015-06-26 DIAGNOSIS — J9601 Acute respiratory failure with hypoxia: Secondary | ICD-10-CM

## 2015-06-26 DIAGNOSIS — E785 Hyperlipidemia, unspecified: Secondary | ICD-10-CM | POA: Diagnosis present

## 2015-06-26 DIAGNOSIS — T45515A Adverse effect of anticoagulants, initial encounter: Secondary | ICD-10-CM | POA: Diagnosis present

## 2015-06-26 DIAGNOSIS — I10 Essential (primary) hypertension: Secondary | ICD-10-CM | POA: Diagnosis present

## 2015-06-26 DIAGNOSIS — E119 Type 2 diabetes mellitus without complications: Secondary | ICD-10-CM

## 2015-06-26 DIAGNOSIS — Z91041 Radiographic dye allergy status: Secondary | ICD-10-CM

## 2015-06-26 DIAGNOSIS — I340 Nonrheumatic mitral (valve) insufficiency: Secondary | ICD-10-CM | POA: Diagnosis present

## 2015-06-26 DIAGNOSIS — I5022 Chronic systolic (congestive) heart failure: Secondary | ICD-10-CM | POA: Insufficient documentation

## 2015-06-26 DIAGNOSIS — J811 Chronic pulmonary edema: Secondary | ICD-10-CM

## 2015-06-26 DIAGNOSIS — J9600 Acute respiratory failure, unspecified whether with hypoxia or hypercapnia: Secondary | ICD-10-CM

## 2015-06-26 DIAGNOSIS — Z95810 Presence of automatic (implantable) cardiac defibrillator: Secondary | ICD-10-CM

## 2015-06-26 DIAGNOSIS — I252 Old myocardial infarction: Secondary | ICD-10-CM

## 2015-06-26 DIAGNOSIS — I469 Cardiac arrest, cause unspecified: Secondary | ICD-10-CM | POA: Diagnosis not present

## 2015-06-26 DIAGNOSIS — D6832 Hemorrhagic disorder due to extrinsic circulating anticoagulants: Secondary | ICD-10-CM | POA: Diagnosis present

## 2015-06-26 DIAGNOSIS — Z79899 Other long term (current) drug therapy: Secondary | ICD-10-CM

## 2015-06-26 DIAGNOSIS — N390 Urinary tract infection, site not specified: Secondary | ICD-10-CM | POA: Diagnosis not present

## 2015-06-26 DIAGNOSIS — Z515 Encounter for palliative care: Secondary | ICD-10-CM | POA: Diagnosis present

## 2015-06-26 DIAGNOSIS — I480 Paroxysmal atrial fibrillation: Secondary | ICD-10-CM | POA: Diagnosis present

## 2015-06-26 DIAGNOSIS — I2510 Atherosclerotic heart disease of native coronary artery without angina pectoris: Secondary | ICD-10-CM | POA: Diagnosis present

## 2015-06-26 DIAGNOSIS — E1122 Type 2 diabetes mellitus with diabetic chronic kidney disease: Secondary | ICD-10-CM | POA: Diagnosis present

## 2015-06-26 DIAGNOSIS — Z9861 Coronary angioplasty status: Secondary | ICD-10-CM

## 2015-06-26 DIAGNOSIS — R079 Chest pain, unspecified: Secondary | ICD-10-CM | POA: Diagnosis present

## 2015-06-26 DIAGNOSIS — G9340 Encephalopathy, unspecified: Secondary | ICD-10-CM | POA: Diagnosis not present

## 2015-06-26 DIAGNOSIS — I130 Hypertensive heart and chronic kidney disease with heart failure and stage 1 through stage 4 chronic kidney disease, or unspecified chronic kidney disease: Principal | ICD-10-CM | POA: Diagnosis present

## 2015-06-26 DIAGNOSIS — R9401 Abnormal electroencephalogram [EEG]: Secondary | ICD-10-CM | POA: Insufficient documentation

## 2015-06-26 DIAGNOSIS — I4819 Other persistent atrial fibrillation: Secondary | ICD-10-CM | POA: Diagnosis present

## 2015-06-26 DIAGNOSIS — Z923 Personal history of irradiation: Secondary | ICD-10-CM

## 2015-06-26 DIAGNOSIS — Z8673 Personal history of transient ischemic attack (TIA), and cerebral infarction without residual deficits: Secondary | ICD-10-CM

## 2015-06-26 DIAGNOSIS — K449 Diaphragmatic hernia without obstruction or gangrene: Secondary | ICD-10-CM | POA: Diagnosis present

## 2015-06-26 DIAGNOSIS — E870 Hyperosmolality and hypernatremia: Secondary | ICD-10-CM | POA: Diagnosis not present

## 2015-06-26 DIAGNOSIS — F329 Major depressive disorder, single episode, unspecified: Secondary | ICD-10-CM | POA: Diagnosis present

## 2015-06-26 DIAGNOSIS — I4901 Ventricular fibrillation: Secondary | ICD-10-CM | POA: Diagnosis not present

## 2015-06-26 DIAGNOSIS — M353 Polymyalgia rheumatica: Secondary | ICD-10-CM | POA: Diagnosis present

## 2015-06-26 DIAGNOSIS — D62 Acute posthemorrhagic anemia: Secondary | ICD-10-CM | POA: Diagnosis present

## 2015-06-26 DIAGNOSIS — I420 Dilated cardiomyopathy: Secondary | ICD-10-CM | POA: Insufficient documentation

## 2015-06-26 DIAGNOSIS — Z888 Allergy status to other drugs, medicaments and biological substances status: Secondary | ICD-10-CM

## 2015-06-26 DIAGNOSIS — R0602 Shortness of breath: Secondary | ICD-10-CM | POA: Diagnosis not present

## 2015-06-26 DIAGNOSIS — R042 Hemoptysis: Secondary | ICD-10-CM | POA: Diagnosis not present

## 2015-06-26 DIAGNOSIS — Z833 Family history of diabetes mellitus: Secondary | ICD-10-CM

## 2015-06-26 DIAGNOSIS — Z9289 Personal history of other medical treatment: Secondary | ICD-10-CM

## 2015-06-26 DIAGNOSIS — I272 Other secondary pulmonary hypertension: Secondary | ICD-10-CM | POA: Diagnosis present

## 2015-06-26 DIAGNOSIS — A047 Enterocolitis due to Clostridium difficile: Secondary | ICD-10-CM | POA: Diagnosis not present

## 2015-06-26 DIAGNOSIS — R579 Shock, unspecified: Secondary | ICD-10-CM | POA: Diagnosis not present

## 2015-06-26 DIAGNOSIS — I255 Ischemic cardiomyopathy: Secondary | ICD-10-CM | POA: Diagnosis present

## 2015-06-26 DIAGNOSIS — R0600 Dyspnea, unspecified: Secondary | ICD-10-CM

## 2015-06-26 DIAGNOSIS — N184 Chronic kidney disease, stage 4 (severe): Secondary | ICD-10-CM | POA: Diagnosis present

## 2015-06-26 DIAGNOSIS — Z9221 Personal history of antineoplastic chemotherapy: Secondary | ICD-10-CM

## 2015-06-26 DIAGNOSIS — J690 Pneumonitis due to inhalation of food and vomit: Secondary | ICD-10-CM | POA: Diagnosis not present

## 2015-06-26 DIAGNOSIS — R0489 Hemorrhage from other sites in respiratory passages: Secondary | ICD-10-CM | POA: Diagnosis present

## 2015-06-26 DIAGNOSIS — E872 Acidosis: Secondary | ICD-10-CM | POA: Diagnosis not present

## 2015-06-26 DIAGNOSIS — Z955 Presence of coronary angioplasty implant and graft: Secondary | ICD-10-CM

## 2015-06-26 DIAGNOSIS — K269 Duodenal ulcer, unspecified as acute or chronic, without hemorrhage or perforation: Secondary | ICD-10-CM | POA: Diagnosis present

## 2015-06-26 DIAGNOSIS — Z881 Allergy status to other antibiotic agents status: Secondary | ICD-10-CM

## 2015-06-26 DIAGNOSIS — Z8249 Family history of ischemic heart disease and other diseases of the circulatory system: Secondary | ICD-10-CM

## 2015-06-26 DIAGNOSIS — E118 Type 2 diabetes mellitus with unspecified complications: Secondary | ICD-10-CM | POA: Insufficient documentation

## 2015-06-26 DIAGNOSIS — Z87891 Personal history of nicotine dependence: Secondary | ICD-10-CM

## 2015-06-26 DIAGNOSIS — N179 Acute kidney failure, unspecified: Secondary | ICD-10-CM | POA: Diagnosis present

## 2015-06-26 DIAGNOSIS — E039 Hypothyroidism, unspecified: Secondary | ICD-10-CM | POA: Diagnosis present

## 2015-06-26 DIAGNOSIS — K219 Gastro-esophageal reflux disease without esophagitis: Secondary | ICD-10-CM | POA: Diagnosis present

## 2015-06-26 DIAGNOSIS — Z7982 Long term (current) use of aspirin: Secondary | ICD-10-CM

## 2015-06-26 DIAGNOSIS — Z6834 Body mass index (BMI) 34.0-34.9, adult: Secondary | ICD-10-CM

## 2015-06-26 DIAGNOSIS — E669 Obesity, unspecified: Secondary | ICD-10-CM | POA: Diagnosis present

## 2015-06-26 DIAGNOSIS — I253 Aneurysm of heart: Secondary | ICD-10-CM | POA: Diagnosis present

## 2015-06-26 DIAGNOSIS — Z7901 Long term (current) use of anticoagulants: Secondary | ICD-10-CM

## 2015-06-26 DIAGNOSIS — E1121 Type 2 diabetes mellitus with diabetic nephropathy: Secondary | ICD-10-CM | POA: Diagnosis present

## 2015-06-26 DIAGNOSIS — Z8572 Personal history of non-Hodgkin lymphomas: Secondary | ICD-10-CM

## 2015-06-26 DIAGNOSIS — B9620 Unspecified Escherichia coli [E. coli] as the cause of diseases classified elsewhere: Secondary | ICD-10-CM | POA: Diagnosis not present

## 2015-06-26 DIAGNOSIS — I481 Persistent atrial fibrillation: Secondary | ICD-10-CM | POA: Diagnosis present

## 2015-06-26 DIAGNOSIS — E871 Hypo-osmolality and hyponatremia: Secondary | ICD-10-CM | POA: Diagnosis not present

## 2015-06-26 DIAGNOSIS — K319 Disease of stomach and duodenum, unspecified: Secondary | ICD-10-CM | POA: Diagnosis present

## 2015-06-26 DIAGNOSIS — F419 Anxiety disorder, unspecified: Secondary | ICD-10-CM | POA: Diagnosis present

## 2015-06-26 LAB — CBC
HEMATOCRIT: 29 % — AB (ref 36.0–46.0)
Hemoglobin: 8.9 g/dL — ABNORMAL LOW (ref 12.0–15.0)
MCH: 28.3 pg (ref 26.0–34.0)
MCHC: 30.7 g/dL (ref 30.0–36.0)
MCV: 92.4 fL (ref 78.0–100.0)
Platelets: 231 10*3/uL (ref 150–400)
RBC: 3.14 MIL/uL — ABNORMAL LOW (ref 3.87–5.11)
RDW: 18.2 % — AB (ref 11.5–15.5)
WBC: 5.8 10*3/uL (ref 4.0–10.5)

## 2015-06-26 LAB — BASIC METABOLIC PANEL
Anion gap: 13 (ref 5–15)
BUN: 16 mg/dL (ref 6–20)
CALCIUM: 9.2 mg/dL (ref 8.9–10.3)
CO2: 27 mmol/L (ref 22–32)
CREATININE: 2.08 mg/dL — AB (ref 0.44–1.00)
Chloride: 100 mmol/L — ABNORMAL LOW (ref 101–111)
GFR calc non Af Amer: 24 mL/min — ABNORMAL LOW (ref >60)
GFR, EST AFRICAN AMERICAN: 28 mL/min — AB (ref >60)
GLUCOSE: 132 mg/dL — AB (ref 65–99)
Potassium: 3.2 mmol/L — ABNORMAL LOW (ref 3.5–5.1)
Sodium: 140 mmol/L (ref 135–145)

## 2015-06-26 LAB — I-STAT TROPONIN, ED: Troponin i, poc: 0.02 ng/mL (ref 0.00–0.08)

## 2015-06-26 MED ORDER — SODIUM CHLORIDE 0.9 % IV SOLN: 10.0000 mL/h | Freq: Once | INTRAVENOUS | Status: DC

## 2015-06-26 NOTE — Telephone Encounter (Signed)
Late entry from 07/10/2015 at 1654:  Patient's daughter called to schedule a follow-up appointment for patient with Dr. Rayann Heman and was connected to scheduling.  She told scheduler that patient is feeling "mostly better" so they have decided not to proceed to ED.  Scheduler was able to schedule follow-up for patient with Dr. Rayann Heman, but while still on the phone, patient's daughter reported that patient's symptoms were worsening again.  Patient could be heard in the background stating that her symptoms were worsened compared to earlier today.  Patient's daughter asked patient if she wanted to proceed to the ED now and patient agreed.  Patient's daughter asked if she should drive the patient to [facility], but advised patient's daughter that if patient is having acute cardiac symptoms, including chest pain, SOB, nausea, etc., that they should call EMS for transport to the closest ED.  She verbalized understanding of all instructions and stated that she will take the patient to the closest ED.  She denies additional questions or concerns at this time.

## 2015-06-26 NOTE — Telephone Encounter (Signed)
Ne Message  Pt was seen in [place] in [place]- stated that she was told her device had moved to an incorrect position- she is also c/o vomitting, stomach pain, and chills. Please call back and discuss.

## 2015-06-26 NOTE — Telephone Encounter (Signed)
Looks like Raquel Sarna spoke with them earlier today,will forward

## 2015-06-26 NOTE — Telephone Encounter (Signed)
Follow Up Phone Call  Mrs. Arcand wants to know if she should go thru the [place] .Marland Kitchen Please call

## 2015-06-26 NOTE — ED Provider Notes (Signed)
CSN: [id number]     Arrival date & time 07/11/2015  1800 History   First MD Initiated Contact with Patient 07/18/2015 2302     Chief Complaint  Patient presents with  . Chest Pain     (Consider location/radiation/quality/duration/timing/severity/associated sxs/prior Treatment) HPI Comments: 65 year old female with a history of chronic medical conditions including ischemic cardiomyopathy, stroke, lymphoma, chronic systolic heart failure, hypertension, hyperlipidemia, ventricular tachycardia, pacemaker, diabetes, chronic kidney disease stage 3, probably myalgia rheumatica, depression, gastric reflux disease, renal atrophy on the left.  Reports 1 day of increasing shortness of breath worse with exertion and chest pain.  She states that when she is at rest.  The chest pain and shortness of breath post decrease.  She is unable to even ambulate across the room without symptoms.  Patient is a 65 y.o. female presenting with chest pain. The history is provided by the patient.  Chest Pain Pain location:  Substernal area Pain quality: aching   Pain radiates to:  Does not radiate Pain radiates to the back: no   Pain severity:  Mild Onset quality:  Gradual Timing:  Constant Progression:  Waxing and waning Chronicity:  Recurrent Context: breathing   Relieved by:  Rest Worsened by:  Exertion Ineffective treatments:  Rest Associated symptoms: anxiety and shortness of breath   Associated symptoms: no cough, no fever and no palpitations     Past Medical History  Diagnosis Date  . Ischemic cardiomyopathy     a. s/p MDT dual chamber ICD implanted 2013 by Dr Westley Gambles at [facility], now followed by Dr Rayann Heman. b. LVEF 30-35% by echo 01/2015 at [facility] per report; 35-40% by echo 01/2015 at [facility].  Marland Kitchen History of stroke      a. 1993  . Coronary artery disease     a. s/p previous interventions in [employer] per patient, no records available. b. cath 04/2013 with non-obstructive disease. c. STEMI 01/2015 @ [facility] -  cardiac cath had shown 100% stenosis of prox PDA, patent stent in RCA with otherwise normal coronary arteries.  . Lymphoma ([place])     a. s/p chemo/radiation 2009  . Chronic systolic CHF (congestive heart failure) ([place])   . HTN (hypertension)   . HLD (hyperlipidemia)   . Ventricular tachycardia ([place])     a. CL 300 msec requiring ICD shocks therpay 5/14 b. recurrent VT 04/2014, placed on amiodarone c. recurrent VT 05/2014 s/p ablation by Dr Rayann Heman d. s/p repeat ablation 07/2014  . DM2 (diabetes mellitus, type 2), newly diagnosed    . CKD (chronic kidney disease) stage 3, GFR 30-59 ml/min    . Polymyalgia rheumatica ([place])   . Depression   . GERD (gastroesophageal reflux disease)   . Renal atrophy, left     a. [place] 02/2015: "Left renal atrophy, likely from chronic renal arterial stenosis"  . Mitral regurgitation     a. mod by echo 01/2015.  . Diverticulosis     a. By CT 02/2015.  Marland Kitchen PAF (paroxysmal atrial fibrillation) ([place])     a. Brief paroxysms during admission 02/2015 - amiodarone increased. Not placed on anticoag due to dual antiplatelet therapy and bleeding risk including in the setting of possible GI pathology.   . Hiatal hernia     a. by EGD 06/2014.  Marland Kitchen Gastritis     a. by EGD 06/2014.  Marland Kitchen Anxiousness 03/02/2015   Past Surgical History  Procedure Laterality Date  . Cardiac defibrillator placement  03/2011    MDT ICD implanted at [facility] by Dr  Heglund  . Coronary angioplasty with stent placement    . Left and right heart catheterization with coronary angiogram N/A 05/03/2013    non-obstructive CAD  . V-tach ablation N/A 05/26/2014    VT ablation by Dr Rayann Heman - PVCs arising from the inferolateral LV, extensive substrate ablation  . Electrophysiologic study N/A 08/04/2014    Procedure: V Tach Ablation;  Surgeon: Thompson Grayer, MD;  Location: [facility];  Service: Cardiovascular;  Laterality: N/A;  . Esophagogastroduodenoscopy (egd) with propofol N/A 04/03/2015    Dr. Oletta Lamas: normal  .  Colonoscopy N/A 04/07/2015    Dr. Oletta Lamas: diverticulosis, internal hemorrhoids   . Pacemaker/defibrillator     Family History  Problem Relation Age of Onset  . Diabetes Father   . Hypertension Father   . Heart disease Father   . Alcoholism Father   . Hypertension Mother   . Heart disease Mother   . Breast cancer Mother   . Stroke Mother   . Diabetes Brother   . Diabetes Brother   . Diabetes Brother   . Diabetes Brother   . Hypertension Sister   . Heart disease Sister   . Alcoholism Sister   . Thyroid disease Sister   . Heart attack Sister   . Heart attack Brother   . Stroke Brother   . Colon cancer Neg Hx    Social History  Substance Use Topics  . Smoking status: Former Smoker -- 0.25 packs/day    Quit date: 05/20/2014  . Smokeless tobacco: Never Used     Comment: she is not ready to quit  . Alcohol Use: No   OB History    No data available     Review of Systems  Constitutional: Negative for fever and chills.  Respiratory: Positive for shortness of breath. Negative for cough.   Cardiovascular: Positive for chest pain. Negative for palpitations and leg swelling.  All other systems reviewed and are negative.     Allergies  Flagyl; Contrast media; Ioxaglate; Potassium sulfate; and Potassium-containing compounds  Home Medications   Prior to Admission medications   Medication Sig Start Date End Date Taking? Authorizing Provider  allopurinol (ZYLOPRIM) 100 MG tablet Take 100 mg by mouth daily.   Yes Historical Provider, MD  ALPRAZolam (XANAX) 0.25 MG tablet Take 1-2 tablets (0.25-0.5 mg total) by mouth 3 (three) times daily as needed for anxiety. 04/19/15  Yes Kathie Dike, MD  amiodarone (PACERONE) 200 MG tablet Take 1 tablet (200 mg total) by mouth 2 (two) times daily. Patient taking differently: Take 200 mg by mouth daily.  02/28/15  Yes Dayna N Dunn, PA-C  aspirin EC 81 MG tablet Take 81 mg by mouth daily.   Yes Historical Provider, MD  atorvastatin (LIPITOR)  40 MG tablet Take 1 tablet (40 mg total) by mouth daily at 6 PM. 06/20/14  Yes Thompson Grayer, MD  carvedilol (COREG) 6.25 MG tablet Take 1 tablet (6.25 mg total) by mouth 2 (two) times daily with a meal. 04/08/15  Yes Costin Karlyne Greenspan, MD  colchicine 0.6 MG tablet Take 0.6 mg by mouth 2 (two) times daily.   Yes Historical Provider, MD  ferrous sulfate 325 (65 FE) MG tablet Take 325 mg by mouth 2 (two) times daily with a meal.   Yes Historical Provider, MD  hydrALAZINE (APRESOLINE) 25 MG tablet Take 25 mg by mouth 3 (three) times daily.   Yes Historical Provider, MD  isosorbide dinitrate (ISORDIL) 20 MG tablet Take 20 mg by mouth  3 (three) times daily.   Yes Historical Provider, MD  levothyroxine (SYNTHROID, LEVOTHROID) 50 MCG tablet Take 1 tablet (50 mcg total) by mouth daily before breakfast. Patient taking differently: Take 75 mcg by mouth daily before breakfast.  03/12/15  Yes Allie Bossier, MD  Multiple Vitamin (MULTI VITAMIN DAILY PO) Take 1 tablet by mouth daily.   Yes Historical Provider, MD  nitroGLYCERIN (NITROSTAT) 0.4 MG SL tablet Place 1 tablet (0.4 mg total) under the tongue every 5 (five) minutes as needed for chest pain. 07/04/14  Yes Amber Sena Slate, NP  oxyCODONE-acetaminophen (PERCOCET/ROXICET) 5-325 MG tablet Take 1 tablet by mouth every 8 (eight) hours as needed for severe pain. 04/08/15  Yes Costin Karlyne Greenspan, MD  pantoprazole (PROTONIX) 40 MG tablet Take 1 tablet (40 mg total) by mouth 2 (two) times daily. Patient taking differently: Take 40 mg by mouth daily.  02/28/15  Yes Dayna N Dunn, PA-C  PARoxetine (PAXIL) 10 MG tablet Take 1 tablet (10 mg total) by mouth daily. 03/12/15  Yes Allie Bossier, MD  potassium chloride SA (K-DUR,KLOR-CON) 20 MEQ tablet Take 20 mEq by mouth 3 (three) times a week. Monday, Wednesday and Friday   Yes Historical Provider, MD  promethazine (PHENERGAN) 25 MG tablet Take 25 mg by mouth every 6 (six) hours as needed for nausea or vomiting.   Yes Historical  Provider, MD  QUEtiapine (SEROQUEL) 25 MG tablet Take 25 mg by mouth at bedtime.   Yes Historical Provider, MD  ranitidine (ZANTAC) 150 MG tablet Take 150 mg by mouth at bedtime.   Yes Historical Provider, MD  senna-docusate (SENOKOT-S) 8.6-50 MG tablet Take 1 tablet by mouth 2 (two) times daily as needed for mild constipation.    Yes Historical Provider, MD  sodium chloride (OCEAN) 0.65 % SOLN nasal spray Place 1 spray into both nostrils daily as needed for congestion.   Yes Historical Provider, MD  torsemide (DEMADEX) 20 MG tablet Take 1 tablet (20 mg total) by mouth daily. Patient taking differently: Take 20 mg by mouth 3 (three) times a week. Monday, Wednesday and Friday 03/12/15  Yes Allie Bossier, MD  warfarin (COUMADIN) 5 MG tablet Take 5 mg by mouth at bedtime.   Yes Historical Provider, MD   BP 143/99 mmHg  Pulse 88  Temp(Src) 97.5 F (36.4 C) (Oral)  Resp 18  Ht 5\' 5"  (1.651 m)  Wt 85.14 kg  BMI 31.23 kg/m2  SpO2 100% Physical Exam  Constitutional: She appears well-developed and well-nourished.  HENT:  Head: Normocephalic.  Eyes: Pupils are equal, round, and reactive to light.  Neck: Normal range of motion.  Cardiovascular: Normal rate and regular rhythm.   Pulmonary/Chest: Effort normal. Tachypnea noted. No respiratory distress. She has no decreased breath sounds. She has no wheezes. She exhibits no tenderness.  Abdominal: Soft.  Genitourinary: Rectal exam shows tenderness. Rectal exam shows no external hemorrhoid, no internal hemorrhoid, no fissure, no mass and anal tone normal.  Musculoskeletal: Normal range of motion.  Neurological: She is alert.  Skin: Skin is warm.  Nursing note and vitals reviewed.   ED Course  Procedures (including critical care time) Labs Review Labs Reviewed  BASIC METABOLIC PANEL - Abnormal; Notable for the following:    Potassium 3.2 (*)    Chloride 100 (*)    Glucose, Bld 132 (*)    Creatinine, Ser 2.08 (*)    GFR calc non Af Amer 24  (*)    GFR calc Af Amer 28 (*)  All other components within normal limits  CBC - Abnormal; Notable for the following:    RBC 3.14 (*)    Hemoglobin 8.9 (*)    HCT 29.0 (*)    RDW 18.2 (*)    All other components within normal limits  D-DIMER, QUANTITATIVE (NOT AT [facility]) - Abnormal; Notable for the following:    D-Dimer, Quant 1.32 (*)    All other components within normal limits  TROPONIN I - Abnormal; Notable for the following:    Troponin I 0.06 (*)    All other components within normal limits  TROPONIN I - Abnormal; Notable for the following:    Troponin I 0.05 (*)    All other components within normal limits  TROPONIN I - Abnormal; Notable for the following:    Troponin I 0.05 (*)    All other components within normal limits  COMPREHENSIVE METABOLIC PANEL - Abnormal; Notable for the following:    Potassium 2.7 (*)    Chloride 99 (*)    Creatinine, Ser 2.04 (*)    Calcium 8.7 (*)    Total Protein 6.1 (*)    Albumin 2.7 (*)    GFR calc non Af Amer 25 (*)    GFR calc Af Amer 29 (*)    All other components within normal limits  CBC WITH DIFFERENTIAL/PLATELET - Abnormal; Notable for the following:    RBC 2.85 (*)    Hemoglobin 8.1 (*)    HCT 26.3 (*)    RDW 18.4 (*)    All other components within normal limits  PROTIME-INR - Abnormal; Notable for the following:    Prothrombin Time 34.8 (*)    INR 3.56 (*)    All other components within normal limits  TSH - Abnormal; Notable for the following:    TSH 5.007 (*)    All other components within normal limits  MAGNESIUM - Abnormal; Notable for the following:    Magnesium 1.6 (*)    All other components within normal limits  GLUCOSE, CAPILLARY - Abnormal; Notable for the following:    Glucose-Capillary 111 (*)    All other components within normal limits  COMPREHENSIVE METABOLIC PANEL - Abnormal; Notable for the following:    Glucose, Bld 123 (*)    Creatinine, Ser 2.41 (*)    Total Protein 6.4 (*)    Albumin 2.6 (*)     GFR calc non Af Amer 20 (*)    GFR calc Af Amer 23 (*)    All other components within normal limits  CBC WITH DIFFERENTIAL/PLATELET - Abnormal; Notable for the following:    WBC 3.8 (*)    RBC 3.03 (*)    Hemoglobin 9.2 (*)    HCT 28.0 (*)    RDW 18.7 (*)    All other components within normal limits  GLUCOSE, CAPILLARY - Abnormal; Notable for the following:    Glucose-Capillary 102 (*)    All other components within normal limits  T4, FREE - Abnormal; Notable for the following:    Free T4 1.33 (*)    All other components within normal limits  GLUCOSE, CAPILLARY - Abnormal; Notable for the following:    Glucose-Capillary 118 (*)    All other components within normal limits  PROTIME-INR - Abnormal; Notable for the following:    Prothrombin Time 35.4 (*)    INR 3.64 (*)    All other components within normal limits  GLUCOSE, CAPILLARY - Abnormal; Notable for the following:    Glucose-Capillary  107 (*)    All other components within normal limits  GLUCOSE, CAPILLARY - Abnormal; Notable for the following:    Glucose-Capillary 125 (*)    All other components within normal limits  MRSA PCR SCREENING  C DIFFICILE QUICK SCREEN W PCR REFLEX  LIPASE, BLOOD  GLUCOSE, CAPILLARY  GLUCOSE, CAPILLARY  GLUCOSE, CAPILLARY  MAGNESIUM  LIPID PANEL  HEMOGLOBIN A1C  PROTIME-INR  CBC  BASIC METABOLIC PANEL  I-STAT TROPOININ, ED  I-STAT TROPOININ, ED  POC OCCULT BLOOD, ED  I-STAT CG4 LACTIC ACID, ED  TYPE AND SCREEN  ABO/RH  PREPARE RBC (CROSSMATCH)    Imaging Review Dg Chest 2 View  06/28/2015  CLINICAL DATA:  eval for issues with dyspnea - pt seemed confused, unable to give a good history - hx of lymphoma, vascular disease, CHF, htn, diabetes, stroke, CAD, PAF, mitral regurgitation EXAM: CHEST  2 VIEW COMPARISON:  07/04/2015 FINDINGS: There is central vascular congestion and interstitial thickening. No focal lung consolidation. No pleural effusion or pneumothorax. Elevation the right  hemidiaphragm is stable. Cardiac silhouette is mildly enlarged. No mediastinal or hilar masses or evidence of adenopathy. Left anterior chest wall AICD is stable and well positioned. Bony thorax is demineralized but intact. IMPRESSION: 1. Mild congestive heart failure suspected reflected by central vascular congestion and interstitial thickening with mild cardiomegaly. No evidence of pneumonia. Electronically Signed   By: Lajean Manes M.D.   On: 06/28/2015 15:10   Nm Pulmonary Perf And Vent  06/27/2015  CLINICAL DATA:  Shortness of breath, chest pain since yesterday EXAM: NUCLEAR MEDICINE VENTILATION - PERFUSION LUNG SCAN TECHNIQUE: Ventilation images were obtained in multiple projections using inhaled aerosol Tc-56m DTPA. Perfusion images were obtained in multiple projections after intravenous injection of Tc-11m MAA. RADIOPHARMACEUTICALS:  31.2 mCi Technetium-42m DTPA aerosol inhalation and 4.2 mCi Technetium-21m MAA IV COMPARISON:  VQ scan 04/14/2015.  Chest x-ray 07/13/2015. FINDINGS: Ventilation: No focal ventilation defect. Perfusion: No wedge shaped peripheral perfusion defects to suggest acute pulmonary embolism. IMPRESSION: No evidence of pulmonary embolus. Electronically Signed   By: Rolm Baptise M.D.   On: 06/27/2015 15:56   I have personally reviewed and evaluated these images and lab results as part of my medical decision-making.   EKG Interpretation   Date/Time:  Monday June 26 2015 18:05:50 EDT Ventricular Rate:  87 PR Interval:  184 QRS Duration: 104 QT Interval:  418 QTC Calculation: 502 R Axis:   11 Text Interpretation:  Normal sinus rhythm Inferior infarct , age  undetermined Possible Anterior infarct , age undetermined Abnormal ECG No  significant change since last tracing Confirmed by YAO  MD, DAVID ([id number])  on 07/15/2015 11:29:05 PM      MDM   Final diagnoses:  Chest pain at rest  Shortness of breath         Junius Creamer, NP 06/28/15 2002  Wandra Arthurs,  MD 07/02/2015 [id number]

## 2015-06-26 NOTE — ED Notes (Addendum)
Pt c/o cp and sob since this morning. Symptoms are aggravated by exertion, relieved by rest. She had a cough this am.

## 2015-06-26 NOTE — Telephone Encounter (Signed)
Patient's reports that the nurse checked her vital signs, but that she is unsure of what they were.  I have not received a call back from [place], patient's nurse.  Patient states that her daughter is already driving back from [place] and going to the SNF to pick her up to bring her to [facility].  Patient states the ambulance in her area will not transport her to [facility] and she will not go back to the [facility] ED.  Patient states the nausea and vomiting have resolved, but states she is still having fevers and intermittent chest pain.  She states her symptoms are the same as they were a day ago she went to the ED.  Advised patient that I will make EP APP aware and she verbalizes appreciation.  She denies additional questions or concerns at this time.  Made Tommye Standard, PA aware that patient is proceeding to the [facility] ED.

## 2015-06-26 NOTE — Telephone Encounter (Addendum)
Patient has been seen at the [place], [place] ED twice in the past week, most recently a day ago for chest pain.  She states that was told by the ED doctor that she needed to follow-up with her EP as soon as possible because her defib is "flipped over and in the wrong position".  Patient is in SNF for rehab for two more weeks.  Patient states that she has been "sick" since her heart attack in November, but that the vomiting, stomach pain, intermittent fever, and chills started last night.  She is unable to send a manual transmission from her [employer] monitor for review as it is at her house and not with her at the SNF.    Requested to speak with patient's nurse.  Initially connected to patient's "RCS", [name] Terry, but was eventually able to speak with patient's nurse, [name] Terry.  [name] Terry states that patient vomited this morning when she "got strangled by some water", but states that she was not aware of any of the other symptoms.  Requested that [name] Terry look at patient's device site and look for abnormalities such as swelling, redness, or drainage.  [name] Terry state she will check on the patient's site and call me back later this morning.  Gave device clinic phone number for return call.

## 2015-06-27 ENCOUNTER — Encounter (HOSPITAL_COMMUNITY): Payer: Self-pay | Admitting: Emergency Medicine

## 2015-06-27 ENCOUNTER — Ambulatory Visit (HOSPITAL_BASED_OUTPATIENT_CLINIC_OR_DEPARTMENT_OTHER): Payer: Medicare Other

## 2015-06-27 ENCOUNTER — Observation Stay (HOSPITAL_COMMUNITY): Payer: Medicare Other

## 2015-06-27 DIAGNOSIS — E785 Hyperlipidemia, unspecified: Secondary | ICD-10-CM

## 2015-06-27 DIAGNOSIS — Z9861 Coronary angioplasty status: Secondary | ICD-10-CM

## 2015-06-27 DIAGNOSIS — E118 Type 2 diabetes mellitus with unspecified complications: Secondary | ICD-10-CM | POA: Insufficient documentation

## 2015-06-27 DIAGNOSIS — I481 Persistent atrial fibrillation: Secondary | ICD-10-CM

## 2015-06-27 DIAGNOSIS — I255 Ischemic cardiomyopathy: Secondary | ICD-10-CM | POA: Diagnosis not present

## 2015-06-27 DIAGNOSIS — E876 Hypokalemia: Secondary | ICD-10-CM

## 2015-06-27 DIAGNOSIS — Z95810 Presence of automatic (implantable) cardiac defibrillator: Secondary | ICD-10-CM

## 2015-06-27 DIAGNOSIS — R079 Chest pain, unspecified: Secondary | ICD-10-CM | POA: Diagnosis not present

## 2015-06-27 DIAGNOSIS — I2510 Atherosclerotic heart disease of native coronary artery without angina pectoris: Secondary | ICD-10-CM | POA: Diagnosis not present

## 2015-06-27 DIAGNOSIS — R072 Precordial pain: Secondary | ICD-10-CM | POA: Diagnosis not present

## 2015-06-27 DIAGNOSIS — I5022 Chronic systolic (congestive) heart failure: Secondary | ICD-10-CM | POA: Diagnosis not present

## 2015-06-27 DIAGNOSIS — E038 Other specified hypothyroidism: Secondary | ICD-10-CM

## 2015-06-27 LAB — CBC WITH DIFFERENTIAL/PLATELET
Basophils Absolute: 0 10*3/uL (ref 0.0–0.1)
Basophils Relative: 0 %
EOS ABS: 0.1 10*3/uL (ref 0.0–0.7)
Eosinophils Relative: 1 %
HEMATOCRIT: 26.3 % — AB (ref 36.0–46.0)
HEMOGLOBIN: 8.1 g/dL — AB (ref 12.0–15.0)
LYMPHS ABS: 0.9 10*3/uL (ref 0.7–4.0)
Lymphocytes Relative: 18 %
MCH: 28.4 pg (ref 26.0–34.0)
MCHC: 30.8 g/dL (ref 30.0–36.0)
MCV: 92.3 fL (ref 78.0–100.0)
Monocytes Absolute: 0.5 10*3/uL (ref 0.1–1.0)
Monocytes Relative: 10 %
NEUTROS ABS: 3.3 10*3/uL (ref 1.7–7.7)
NEUTROS PCT: 71 %
PLATELETS: 204 10*3/uL (ref 150–400)
RBC: 2.85 MIL/uL — AB (ref 3.87–5.11)
RDW: 18.4 % — ABNORMAL HIGH (ref 11.5–15.5)
WBC: 4.7 10*3/uL (ref 4.0–10.5)

## 2015-06-27 LAB — COMPREHENSIVE METABOLIC PANEL
ALBUMIN: 2.7 g/dL — AB (ref 3.5–5.0)
ALK PHOS: 59 U/L (ref 38–126)
ALT: 25 U/L (ref 14–54)
ANION GAP: 12 (ref 5–15)
AST: 36 U/L (ref 15–41)
BILIRUBIN TOTAL: 0.9 mg/dL (ref 0.3–1.2)
BUN: 16 mg/dL (ref 6–20)
CHLORIDE: 99 mmol/L — AB (ref 101–111)
CO2: 27 mmol/L (ref 22–32)
CREATININE: 2.04 mg/dL — AB (ref 0.44–1.00)
Calcium: 8.7 mg/dL — ABNORMAL LOW (ref 8.9–10.3)
GFR, EST AFRICAN AMERICAN: 29 mL/min — AB (ref >60)
GFR, EST NON AFRICAN AMERICAN: 25 mL/min — AB (ref >60)
Glucose, Bld: 89 mg/dL (ref 65–99)
POTASSIUM: 2.7 mmol/L — AB (ref 3.5–5.1)
Sodium: 138 mmol/L (ref 135–145)
Total Protein: 6.1 g/dL — ABNORMAL LOW (ref 6.5–8.1)

## 2015-06-27 LAB — I-STAT TROPONIN, ED: TROPONIN I, POC: 0.03 ng/mL (ref 0.00–0.08)

## 2015-06-27 LAB — PREPARE RBC (CROSSMATCH)

## 2015-06-27 LAB — MAGNESIUM: MAGNESIUM: 1.6 mg/dL — AB (ref 1.7–2.4)

## 2015-06-27 LAB — TSH: TSH: 5.007 u[IU]/mL — AB (ref 0.350–4.500)

## 2015-06-27 LAB — ECHOCARDIOGRAM COMPLETE
Height: 65 in
WEIGHTICAEL: 2921.6 [oz_av]

## 2015-06-27 LAB — TROPONIN I
Troponin I: 0.06 ng/mL — ABNORMAL HIGH (ref <0.031)
Troponin I: 0.05 ng/mL — ABNORMAL HIGH (ref <0.031)
Troponin I: 0.05 ng/mL — ABNORMAL HIGH (ref <0.031)

## 2015-06-27 LAB — MRSA PCR SCREENING: MRSA BY PCR: NEGATIVE

## 2015-06-27 LAB — PROTIME-INR
INR: 3.56 — ABNORMAL HIGH (ref 0.00–1.49)
Prothrombin Time: 34.8 seconds — ABNORMAL HIGH (ref 11.6–15.2)

## 2015-06-27 LAB — POC OCCULT BLOOD, ED: FECAL OCCULT BLD: NEGATIVE

## 2015-06-27 LAB — GLUCOSE, CAPILLARY
GLUCOSE-CAPILLARY: 77 mg/dL (ref 65–99)
GLUCOSE-CAPILLARY: 77 mg/dL (ref 65–99)
Glucose-Capillary: 111 mg/dL — ABNORMAL HIGH (ref 65–99)
Glucose-Capillary: 74 mg/dL (ref 65–99)
Glucose-Capillary: 102 mg/dL — ABNORMAL HIGH (ref 65–99)

## 2015-06-27 LAB — LIPASE, BLOOD: LIPASE: 36 U/L (ref 11–51)

## 2015-06-27 LAB — ABO/RH: ABO/RH(D): A POS

## 2015-06-27 LAB — I-STAT CG4 LACTIC ACID, ED: Lactic Acid, Venous: 1.57 mmol/L (ref 0.5–2.0)

## 2015-06-27 LAB — D-DIMER, QUANTITATIVE: D-Dimer, Quant: 1.32 ug/mL-FEU — ABNORMAL HIGH (ref 0.00–0.50)

## 2015-06-27 MED ORDER — POTASSIUM CHLORIDE CRYS ER 20 MEQ PO TBCR
20.0000 meq | EXTENDED_RELEASE_TABLET | ORAL | Status: DC
  Administered 2015-06-28: 20 meq via ORAL
  Filled 2015-06-27: qty 1

## 2015-06-27 MED ORDER — MORPHINE SULFATE (PF) 2 MG/ML IV SOLN: 1.0000 mg | Freq: Once | INTRAVENOUS | Status: DC

## 2015-06-27 MED ORDER — LEVOTHYROXINE SODIUM 75 MCG PO TABS
75.0000 ug | ORAL_TABLET | Freq: Every day | ORAL | Status: DC
  Administered 2015-06-27 – 2015-06-28 (×2): 75 ug via ORAL
  Filled 2015-06-27 (×2): qty 1

## 2015-06-27 MED ORDER — OXYCODONE-ACETAMINOPHEN 5-325 MG PO TABS
1.0000 | ORAL_TABLET | Freq: Three times a day (TID) | ORAL | Status: DC | PRN
  Administered 2015-07-04: 1 via ORAL
  Filled 2015-06-27: qty 1

## 2015-06-27 MED ORDER — NITROGLYCERIN 0.4 MG SL SUBL
0.4000 mg | SUBLINGUAL_TABLET | SUBLINGUAL | Status: DC | PRN
  Administered 2015-06-27 – 2015-07-04 (×4): 0.4 mg via SUBLINGUAL
  Filled 2015-06-27 (×3): qty 1

## 2015-06-27 MED ORDER — ACETAMINOPHEN 325 MG PO TABS: 650.0000 mg | ORAL_TABLET | Freq: Four times a day (QID) | ORAL | Status: DC | PRN

## 2015-06-27 MED ORDER — POTASSIUM CHLORIDE 10 MEQ/100ML IV SOLN
10.0000 meq | INTRAVENOUS | Status: AC
  Administered 2015-06-27 (×2): 10 meq via INTRAVENOUS
  Filled 2015-06-27 (×2): qty 100

## 2015-06-27 MED ORDER — NITROGLYCERIN 0.4 MG SL SUBL
SUBLINGUAL_TABLET | SUBLINGUAL | Status: AC
  Administered 2015-06-27: 0.4 mg
  Administered 2015-06-27: 04:00:00
  Filled 2015-06-27: qty 3

## 2015-06-27 MED ORDER — QUETIAPINE FUMARATE 50 MG PO TABS
25.0000 mg | ORAL_TABLET | Freq: Every day | ORAL | Status: DC
  Administered 2015-06-27 – 2015-07-03 (×7): 25 mg via ORAL
  Filled 2015-06-27 (×7): qty 1

## 2015-06-27 MED ORDER — ATORVASTATIN CALCIUM 40 MG PO TABS
40.0000 mg | ORAL_TABLET | Freq: Every day | ORAL | Status: DC
  Administered 2015-06-27 – 2015-07-05 (×9): 40 mg via ORAL
  Filled 2015-06-27 (×9): qty 1

## 2015-06-27 MED ORDER — ALPRAZOLAM 0.25 MG PO TABS
0.2500 mg | ORAL_TABLET | Freq: Three times a day (TID) | ORAL | Status: DC | PRN
  Administered 2015-06-27 – 2015-06-28 (×3): 0.5 mg via ORAL
  Administered 2015-06-30 – 2015-07-03 (×4): 0.25 mg via ORAL
  Administered 2015-07-04: 0.5 mg via ORAL
  Filled 2015-06-27 (×4): qty 2
  Filled 2015-06-27 (×3): qty 1
  Filled 2015-06-27 (×2): qty 2

## 2015-06-27 MED ORDER — PAROXETINE HCL 10 MG PO TABS
10.0000 mg | ORAL_TABLET | Freq: Every day | ORAL | Status: DC
  Administered 2015-06-27 – 2015-07-04 (×6): 10 mg via ORAL
  Filled 2015-06-27 (×7): qty 1

## 2015-06-27 MED ORDER — POTASSIUM CHLORIDE CRYS ER 20 MEQ PO TBCR
30.0000 meq | EXTENDED_RELEASE_TABLET | Freq: Once | ORAL | Status: AC
  Administered 2015-06-27: 30 meq via ORAL
  Filled 2015-06-27: qty 1

## 2015-06-27 MED ORDER — FERROUS SULFATE 325 (65 FE) MG PO TABS
325.0000 mg | ORAL_TABLET | Freq: Two times a day (BID) | ORAL | Status: DC
  Administered 2015-06-27 – 2015-07-04 (×14): 325 mg via ORAL
  Filled 2015-06-27 (×15): qty 1

## 2015-06-27 MED ORDER — TORSEMIDE 20 MG PO TABS
20.0000 mg | ORAL_TABLET | ORAL | Status: DC
  Administered 2015-06-28: 20 mg via ORAL
  Filled 2015-06-27: qty 1

## 2015-06-27 MED ORDER — ASPIRIN EC 81 MG PO TBEC
81.0000 mg | DELAYED_RELEASE_TABLET | Freq: Every day | ORAL | Status: DC
  Administered 2015-06-27 – 2015-07-04 (×6): 81 mg via ORAL
  Filled 2015-06-27 (×8): qty 1

## 2015-06-27 MED ORDER — MAGNESIUM SULFATE 2 GM/50ML IV SOLN
2.0000 g | Freq: Once | INTRAVENOUS | Status: AC
  Administered 2015-06-27: 2 g via INTRAVENOUS
  Filled 2015-06-27: qty 50

## 2015-06-27 MED ORDER — CARVEDILOL 6.25 MG PO TABS
6.2500 mg | ORAL_TABLET | Freq: Two times a day (BID) | ORAL | Status: DC
  Administered 2015-06-27 – 2015-07-02 (×10): 6.25 mg via ORAL
  Filled 2015-06-27 (×11): qty 1

## 2015-06-27 MED ORDER — ISOSORBIDE DINITRATE 10 MG PO TABS
20.0000 mg | ORAL_TABLET | Freq: Three times a day (TID) | ORAL | Status: DC
  Administered 2015-06-27 – 2015-07-04 (×19): 20 mg via ORAL
  Filled 2015-06-27 (×2): qty 1
  Filled 2015-06-27 (×5): qty 2
  Filled 2015-06-27: qty 1
  Filled 2015-06-27: qty 2
  Filled 2015-06-27: qty 1
  Filled 2015-06-27: qty 2
  Filled 2015-06-27 (×2): qty 1
  Filled 2015-06-27: qty 2
  Filled 2015-06-27: qty 1
  Filled 2015-06-27: qty 2
  Filled 2015-06-27: qty 1
  Filled 2015-06-27: qty 2
  Filled 2015-06-27: qty 1
  Filled 2015-06-27 (×2): qty 2
  Filled 2015-06-27 (×2): qty 1

## 2015-06-27 MED ORDER — HYDRALAZINE HCL 25 MG PO TABS
25.0000 mg | ORAL_TABLET | Freq: Three times a day (TID) | ORAL | Status: DC
  Administered 2015-06-27 – 2015-07-04 (×20): 25 mg via ORAL
  Filled 2015-06-27 (×22): qty 1

## 2015-06-27 MED ORDER — TECHNETIUM TC 99M DIETHYLENETRIAME-PENTAACETIC ACID: 31.2000 | Freq: Once | INTRAVENOUS | Status: DC | PRN

## 2015-06-27 MED ORDER — FAMOTIDINE 20 MG PO TABS
20.0000 mg | ORAL_TABLET | Freq: Two times a day (BID) | ORAL | Status: DC
  Administered 2015-06-27 – 2015-06-28 (×3): 20 mg via ORAL
  Filled 2015-06-27 (×3): qty 1

## 2015-06-27 MED ORDER — PROMETHAZINE HCL 25 MG PO TABS
25.0000 mg | ORAL_TABLET | Freq: Four times a day (QID) | ORAL | Status: DC | PRN
  Administered 2015-07-14: 25 mg via ORAL
  Filled 2015-06-27: qty 1

## 2015-06-27 MED ORDER — SODIUM CHLORIDE 0.9 % IV SOLN
Freq: Once | INTRAVENOUS | Status: AC
  Administered 2015-06-27: 13:00:00 via INTRAVENOUS

## 2015-06-27 MED ORDER — INSULIN ASPART 100 UNIT/ML ~~LOC~~ SOLN
0.0000 [IU] | Freq: Three times a day (TID) | SUBCUTANEOUS | Status: DC
Start: 2015-06-27 — End: 2015-06-30
  Administered 2015-06-28 – 2015-06-30 (×3): 1 [IU] via SUBCUTANEOUS

## 2015-06-27 MED ORDER — AMIODARONE HCL 200 MG PO TABS
200.0000 mg | ORAL_TABLET | Freq: Every day | ORAL | Status: DC
  Administered 2015-06-27 – 2015-07-04 (×6): 200 mg via ORAL
  Filled 2015-06-27 (×7): qty 1

## 2015-06-27 MED ORDER — ACETAMINOPHEN 650 MG RE SUPP: 650.0000 mg | Freq: Four times a day (QID) | RECTAL | Status: DC | PRN

## 2015-06-27 MED ORDER — COLCHICINE 0.6 MG PO TABS
0.6000 mg | ORAL_TABLET | Freq: Two times a day (BID) | ORAL | Status: DC
  Administered 2015-06-27 (×2): 0.6 mg via ORAL
  Filled 2015-06-27 (×2): qty 1

## 2015-06-27 MED ORDER — ALLOPURINOL 100 MG PO TABS
100.0000 mg | ORAL_TABLET | Freq: Every day | ORAL | Status: DC
  Administered 2015-06-27 – 2015-07-04 (×6): 100 mg via ORAL
  Filled 2015-06-27 (×7): qty 1

## 2015-06-27 MED ORDER — TECHNETIUM TO 99M ALBUMIN AGGREGATED
4.2000 | Freq: Once | INTRAVENOUS | Status: AC | PRN
  Administered 2015-06-27: 4 via INTRAVENOUS

## 2015-06-27 MED ORDER — ONDANSETRON HCL 4 MG PO TABS: 4.0000 mg | ORAL_TABLET | Freq: Four times a day (QID) | ORAL | Status: DC | PRN

## 2015-06-27 MED ORDER — PANTOPRAZOLE SODIUM 40 MG PO TBEC
40.0000 mg | DELAYED_RELEASE_TABLET | Freq: Every day | ORAL | Status: DC
  Administered 2015-06-27 – 2015-07-04 (×6): 40 mg via ORAL
  Filled 2015-06-27 (×7): qty 1

## 2015-06-27 MED ORDER — SODIUM CHLORIDE 0.9% FLUSH
3.0000 mL | Freq: Two times a day (BID) | INTRAVENOUS | Status: DC
  Administered 2015-06-27 – 2015-07-03 (×7): 3 mL via INTRAVENOUS

## 2015-06-27 MED ORDER — ONDANSETRON HCL 4 MG/2ML IJ SOLN: 4.0000 mg | Freq: Four times a day (QID) | INTRAMUSCULAR | Status: DC | PRN

## 2015-06-27 NOTE — Progress Notes (Signed)
ANTICOAGULATION CONSULT NOTE - Initial Consult  Pharmacy Consult for heparin Indication: chest pain/ACS  Allergies  Allergen Reactions  . Flagyl [Metronidazole] Swelling and Rash    Face swells   . Contrast Media [Iodinated Diagnostic Agents] Rash  . Ioxaglate Rash  . Potassium Sulfate Rash and Other (See Comments)    Headaches  . Potassium-Containing Compounds Other (See Comments)    Headaches-- reports no problems now    Patient Measurements: Height: 5\' 5"  (165.1 cm) Weight: 182 lb 9.6 oz (82.827 kg) IBW/kg (Calculated) : 57 Heparin Dosing Weight: 74.7kg  Vital Signs: Temp: 98.8 F (37.1 C) (04/04 0335) Temp Source: Oral (04/04 0335) BP: 132/79 mmHg (04/04 0404) Pulse Rate: 86 (04/04 0404)  Labs:  Recent Labs  06/30/2015 1832 06/27/15 0546  HGB 8.9* 8.1*  HCT 29.0* 26.3*  PLT 231 204  LABPROT  --  34.8*  INR  --  3.56*  CREATININE 2.08* 2.04*  TROPONINI  --  0.06*    Estimated Creatinine Clearance: 30 mL/min (by C-G formula based on Cr of 2.04).   Medical History: Past Medical History  Diagnosis Date  . Ischemic cardiomyopathy     a. s/p MDT dual chamber ICD implanted 2013 by Dr Westley Gambles at [facility], now followed by Dr Rayann Heman. b. LVEF 30-35% by echo 01/2015 at [facility] per report; 35-40% by echo 01/2015 at [facility].  Marland Kitchen History of stroke      a. 1993  . Coronary artery disease     a. s/p previous interventions in [place] per patient, no records available. b. cath 04/2013 with non-obstructive disease. c. STEMI 01/2015 @ [place] - cardiac cath had shown 100% stenosis of prox PDA, patent stent in RCA with otherwise normal coronary arteries.  . Lymphoma ([place])     a. s/p chemo/radiation 2009  . Chronic systolic CHF (congestive heart failure) ([place])   . HTN (hypertension)   . HLD (hyperlipidemia)   . Ventricular tachycardia ([place])     a. CL 300 msec requiring ICD shocks therpay 5/14 b. recurrent VT 04/2014, placed on amiodarone c. recurrent VT 05/2014 s/p ablation by Dr  Rayann Heman d. s/p repeat ablation 07/2014  . DM2 (diabetes mellitus, type 2), newly diagnosed    . CKD (chronic kidney disease) stage 3, GFR 30-59 ml/min    . Polymyalgia rheumatica ([place])   . Depression   . GERD (gastroesophageal reflux disease)   . Renal atrophy, left     a. [place] 02/2015: "Left renal atrophy, likely from chronic renal arterial stenosis"  . Mitral regurgitation     a. mod by echo 01/2015.  . Diverticulosis     a. By CT 02/2015.  Marland Kitchen PAF (paroxysmal atrial fibrillation) ([place])     a. Brief paroxysms during admission 02/2015 - amiodarone increased. Not placed on anticoag due to dual antiplatelet therapy and bleeding risk including in the setting of possible GI pathology.   . Hiatal hernia     a. by EGD 06/2014.  Marland Kitchen Gastritis     a. by EGD 06/2014.  Marland Kitchen Anxiousness 03/02/2015    Medications:  Prescriptions prior to admission  Medication Sig Dispense Refill Last Dose  . allopurinol (ZYLOPRIM) 100 MG tablet Take 100 mg by mouth daily.   06/25/2015 at Unknown time  . ALPRAZolam (XANAX) 0.25 MG tablet Take 1-2 tablets (0.25-0.5 mg total) by mouth 3 (three) times daily as needed for anxiety. 30 tablet 0 unk  . amiodarone (PACERONE) 200 MG tablet Take 1 tablet (200 mg total) by mouth 2 (  two) times daily. (Patient taking differently: Take 200 mg by mouth daily. ) 60 tablet 1 06/25/2015 at Unknown time  . aspirin EC 81 MG tablet Take 81 mg by mouth daily.   06/25/2015 at Unknown time  . atorvastatin (LIPITOR) 40 MG tablet Take 1 tablet (40 mg total) by mouth daily at 6 PM. 90 tablet 30 06/25/2015 at Unknown time  . carvedilol (COREG) 6.25 MG tablet Take 1 tablet (6.25 mg total) by mouth 2 (two) times daily with a meal. 60 tablet 0 06/25/2015 at 1000  . colchicine 0.6 MG tablet Take 0.6 mg by mouth 2 (two) times daily.   06/25/2015 at Unknown time  . ferrous sulfate 325 (65 FE) MG tablet Take 325 mg by mouth 2 (two) times daily with a meal.   06/25/2015 at Unknown time  . hydrALAZINE (APRESOLINE) 25 MG tablet  Take 25 mg by mouth 3 (three) times daily.   06/25/2015 at Unknown time  . isosorbide dinitrate (ISORDIL) 20 MG tablet Take 20 mg by mouth 3 (three) times daily.   06/25/2015 at Unknown time  . levothyroxine (SYNTHROID, LEVOTHROID) 50 MCG tablet Take 1 tablet (50 mcg total) by mouth daily before breakfast. (Patient taking differently: Take 75 mcg by mouth daily before breakfast. ) 60 tablet 0 06/25/2015 at Unknown time  . Multiple Vitamin (MULTI VITAMIN DAILY PO) Take 1 tablet by mouth daily.   06/25/2015 at Unknown time  . nitroGLYCERIN (NITROSTAT) 0.4 MG SL tablet Place 1 tablet (0.4 mg total) under the tongue every 5 (five) minutes as needed for chest pain. 25 tablet 3 Past Month at Unknown time  . oxyCODONE-acetaminophen (PERCOCET/ROXICET) 5-325 MG tablet Take 1 tablet by mouth every 8 (eight) hours as needed for severe pain. 30 tablet 0 unk  . pantoprazole (PROTONIX) 40 MG tablet Take 1 tablet (40 mg total) by mouth 2 (two) times daily. (Patient taking differently: Take 40 mg by mouth daily. ) 60 tablet 1 06/25/2015 at Unknown time  . PARoxetine (PAXIL) 10 MG tablet Take 1 tablet (10 mg total) by mouth daily. 30 tablet 0 06/25/2015 at Unknown time  . potassium chloride SA (K-DUR,KLOR-CON) 20 MEQ tablet Take 20 mEq by mouth 3 (three) times a week. Monday, Wednesday and Friday   06/23/2015  . promethazine (PHENERGAN) 25 MG tablet Take 25 mg by mouth every 6 (six) hours as needed for nausea or vomiting.   unk  . QUEtiapine (SEROQUEL) 25 MG tablet Take 25 mg by mouth at bedtime.   06/25/2015 at Unknown time  . ranitidine (ZANTAC) 150 MG tablet Take 150 mg by mouth at bedtime.   06/25/2015 at Unknown time  . senna-docusate (SENOKOT-S) 8.6-50 MG tablet Take 1 tablet by mouth 2 (two) times daily as needed for mild constipation.    unk  . sodium chloride (OCEAN) 0.65 % SOLN nasal spray Place 1 spray into both nostrils daily as needed for congestion.   unk  . torsemide (DEMADEX) 20 MG tablet Take 1 tablet (20 mg total) by  mouth daily. (Patient taking differently: Take 20 mg by mouth 3 (three) times a week. Monday, Wednesday and Friday) 30 tablet 0 06/23/2015  . warfarin (COUMADIN) 5 MG tablet Take 5 mg by mouth at bedtime.   06/25/2015 at Unknown time   Scheduled:  . allopurinol  100 mg Oral Daily  . amiodarone  200 mg Oral Daily  . aspirin EC  81 mg Oral Daily  . atorvastatin  40 mg Oral q1800  . carvedilol  6.25 mg Oral BID WC  . colchicine  0.6 mg Oral BID  . famotidine  20 mg Oral BID  . ferrous sulfate  325 mg Oral BID WC  . hydrALAZINE  25 mg Oral 3 times per day  . insulin aspart  0-9 Units Subcutaneous TID WC  . isosorbide dinitrate  20 mg Oral TID  . levothyroxine  75 mcg Oral QAC breakfast  . pantoprazole  40 mg Oral Daily  . PARoxetine  10 mg Oral Daily  . potassium chloride  10 mEq Intravenous Q1 Hr x 5  . [START ON 06/28/2015] potassium chloride SA  20 mEq Oral Once per day on Mon Wed Fri  . QUEtiapine  25 mg Oral QHS  . sodium chloride flush  3 mL Intravenous Q12H  . [START ON 06/28/2015] torsemide  20 mg Oral Once per day on Mon Wed Fri    Assessment: 65 yo female here with CP and with history of CAD with PCI. Pharmacy consulted to dose heparin when INR < 2. She is on coumadin PTA for afib (CHADSVASC= 7/9).  -INR= 3.56  Goal of Therapy:  INR 2-3 Monitor platelets by anticoagulation protocol: Yes   Plan:  -No heparin needed now -Daily PT/INR -Will follow plans  Hildred Laser, Pharm D 06/27/2015 8:10 AM

## 2015-06-27 NOTE — Consult Note (Signed)
CARDIOLOGY CONSULT NOTE   Patient ID: [name] Terry MRN: [id number], DOB/AGE: [birth year]   Admit date: 07/18/2015 Date of Consult: 06/27/2015  Primary Physician: Pcp Not In System Primary Cardiologist: Dr Johnsie Cancel, Dr Rayann Heman Referring physician; Dr Sherral Hammers  Reason for consult:  SOB, chest pain  Problem List  Past Medical History  Diagnosis Date  . Ischemic cardiomyopathy     a. s/p MDT dual chamber ICD implanted 2013 by Dr Westley Gambles at [facility], now followed by Dr Rayann Heman. b. LVEF 30-35% by echo 01/2015 at [facility] per report; 35-40% by echo 01/2015 at [facility].  Marland Kitchen History of stroke      a. 1993  . Coronary artery disease     a. s/p previous interventions in [place] per patient, no records available. b. cath 04/2013 with non-obstructive disease. c. STEMI 01/2015 @ [place] - cardiac cath had shown 100% stenosis of prox PDA, patent stent in RCA with otherwise normal coronary arteries.  . Lymphoma ([place])     a. s/p chemo/radiation 2009  . Chronic systolic CHF (congestive heart failure) ([place])   . HTN (hypertension)   . HLD (hyperlipidemia)   . Ventricular tachycardia ([place])     a. CL 300 msec requiring ICD shocks therpay 5/14 b. recurrent VT 04/2014, placed on amiodarone c. recurrent VT 05/2014 s/p ablation by Dr Rayann Heman d. s/p repeat ablation 07/2014  . DM2 (diabetes mellitus, type 2), newly diagnosed    . CKD (chronic kidney disease) stage 3, GFR 30-59 ml/min    . Polymyalgia rheumatica ([place])   . Depression   . GERD (gastroesophageal reflux disease)   . Renal atrophy, left     a. [place] 02/2015: "Left renal atrophy, likely from chronic renal arterial stenosis"  . Mitral regurgitation     a. mod by echo 01/2015.  . Diverticulosis     a. By CT 02/2015.  Marland Kitchen PAF (paroxysmal atrial fibrillation) ([place])     a. Brief paroxysms during admission 02/2015 - amiodarone increased. Not placed on anticoag due to dual antiplatelet therapy and bleeding risk including in the setting of possible GI pathology.   .  Hiatal hernia     a. by EGD 06/2014.  Marland Kitchen Gastritis     a. by EGD 06/2014.  Marland Kitchen Anxiousness 03/02/2015    Past Surgical History  Procedure Laterality Date  . Cardiac defibrillator placement  03/2011    MDT ICD implanted at [facility] by Dr Tana Coast  . Coronary angioplasty with stent placement    . Left and right heart catheterization with coronary angiogram N/A 05/03/2013    non-obstructive CAD  . V-tach ablation N/A 05/26/2014    VT ablation by Dr Rayann Heman - PVCs arising from the inferolateral LV, extensive substrate ablation  . Electrophysiologic study N/A 08/04/2014    Procedure: V Tach Ablation;  Surgeon: Thompson Grayer, MD;  Location: [place] [facility];  Service: Cardiovascular;  Laterality: N/A;  . Esophagogastroduodenoscopy (egd) with propofol N/A 04/03/2015    Dr. Oletta Lamas: normal  . Colonoscopy N/A 04/07/2015    Dr. Oletta Lamas: diverticulosis, internal hemorrhoids   . Pacemaker/defibrillator       Allergies  Allergies  Allergen Reactions  . Flagyl [Metronidazole] Swelling and Rash    Face swells   . Contrast Media [Iodinated Diagnostic Agents] Rash  . Ioxaglate Rash  . Potassium Sulfate Rash and Other (See Comments)    Headaches  . Potassium-Containing Compounds Other (See Comments)    Headaches-- reports no problems now    HPI   This  is a 65 year old female patient with h/o of CAD, HTN, ICM s/p MDT dual chamber ICD 2013, lymphoma s/p chemo/radiation [id number], chronic systolic CHF, status post VT ablation 2, HTN, HLD, VT, DM, CKD stage III, PMR and depression. The patient was recently admitted to [facility] 01/2015 for inferior wall STEMI - cardiac cath had shown 100% stenosis of prox PDA, patent stent in RCA with otherwise normal coronary arteries. Echocardiogram showed EF 30-35%. Post cath patient developed pulmonary edema, hemoptysis and pneumonia. She was treated with IV Lasix and discharged home 02/19/15 with prescription for clindamycin. At [facility] VQ scan was negative for PE,  Ranexa was added and cath films were reviewed by interventional-who agreed that her coronary lesion could not be reopened. GI saw the patient and there was a question of mesenteric ischemia but a CTA could not be done because of her renal function. She was treated medically. She did have brief PAF and Dr. Rayann Heman recommended increasing her amiodarone to 200 mg twice a day. She was not placed on anticoagulation given dual antiplatelet therapy and bleeding risk with all her GI symptoms. She remained in sinus rhythm on the rest of the hospitalization. No Ace or ARB with CKD. She was hospitalized in December twice again for acute respiratory failure with hypoxia sec to acute on chronic systolic CHF. Admitted in January with pneumonia and GI bleed.saw GI at that time underwent EGD 1/9 without acute findings and a colonoscopy 1/13 without acute findings. She was restarted on Eliquis on discharge. She did require 2 units packed blood cells at that time.   She presented to the ER yesterday with chest pain. Patient has been having chest pain since yesterday morning. Pain is mostly on the left side of the chest radiating to her lower back. Had one episode of nausea vomiting and has been exertional shortness of breath. Pain persisted throughout the day. EKG was showing nonspecific changes and chest x-ray was unremarkable. Troponins are negative. Patient's chest pain improved with sublingual nitroglycerin. Patient's d-dimer is elevated. Since patient has chronic kidney disease will need VQ scan and cannot get CAT scan to rule out PE. Patient states she was switched from Eliquis to Coumadin while she was in a nursing home. Stool for occult blood has been negative.   Inpatient Medications  . allopurinol  100 mg Oral Daily  . amiodarone  200 mg Oral Daily  . aspirin EC  81 mg Oral Daily  . atorvastatin  40 mg Oral q1800  . carvedilol  6.25 mg Oral BID WC  . colchicine  0.6 mg Oral BID  . famotidine  20 mg Oral BID  .  ferrous sulfate  325 mg Oral BID WC  . hydrALAZINE  25 mg Oral 3 times per day  . insulin aspart  0-9 Units Subcutaneous TID WC  . isosorbide dinitrate  20 mg Oral TID  . levothyroxine  75 mcg Oral QAC breakfast  . magnesium sulfate 1 - 4 g bolus IVPB  2 g Intravenous Once  . pantoprazole  40 mg Oral Daily  . PARoxetine  10 mg Oral Daily  . potassium chloride  10 mEq Intravenous Q1 Hr x 5  . [START ON 06/28/2015] potassium chloride SA  20 mEq Oral Once per day on Mon Wed Fri  . QUEtiapine  25 mg Oral QHS  . sodium chloride flush  3 mL Intravenous Q12H  . [START ON 06/28/2015] torsemide  20 mg Oral Once per day on Mon Wed Fri  Family History Family History  Problem Relation Age of Onset  . Diabetes Father   . Hypertension Father   . Heart disease Father   . Alcoholism Father   . Hypertension Mother   . Heart disease Mother   . Breast cancer Mother   . Stroke Mother   . Diabetes Brother   . Diabetes Brother   . Diabetes Brother   . Diabetes Brother   . Hypertension Sister   . Heart disease Sister   . Alcoholism Sister   . Thyroid disease Sister   . Heart attack Sister   . Heart attack Brother   . Stroke Brother   . Colon cancer Neg Hx      Social History Social History   Social History  . Marital Status: Single    Spouse Name: N/A  . Number of Children: 2  . Years of Education: N/A   Occupational History  . Not on file.   Social History Main Topics  . Smoking status: Former Smoker -- 0.25 packs/day    Quit date: 05/20/2014  . Smokeless tobacco: Never Used     Comment: she is not ready to quit  . Alcohol Use: No  . Drug Use: No  . Sexual Activity: Not on file   Other Topics Concern  . Not on file   Social History Narrative     Review of Systems  General:  No chills, fever, night sweats or weight changes.  Cardiovascular:  No chest pain, dyspnea on exertion, edema, orthopnea, palpitations, paroxysmal nocturnal dyspnea. Dermatological: No rash,  lesions/masses Respiratory: No cough, dyspnea Urologic: No hematuria, dysuria Abdominal:   No nausea, vomiting, diarrhea, bright red blood per rectum, melena, or hematemesis Neurologic:  No visual changes, wkns, changes in mental status. All other systems reviewed and are otherwise negative except as noted above.  Physical Exam  Blood pressure 141/85, pulse 81, temperature 98.2 F (36.8 C), temperature source Oral, resp. rate 28, height 5\' 5"  (1.651 m), weight 182 lb 9.6 oz (82.827 kg), SpO2 100 %.  General: Pleasant, NAD Psych: Normal affect. Neuro: Alert and oriented X 3. Moves all extremities spontaneously. HEENT: Normal  Neck: Supple without bruits or JVD. Lungs:  Resp regular and unlabored, CTA. Heart: RRR no s3, s4, 2/6 systolic murmur. Abdomen: Soft, non-tender, non-distended, BS + x 4.  Extremities: No clubbing, cyanosis or edema. DP/PT/Radials 2+ and equal bilaterally.  Labs   Recent Labs  06/27/15 0546  TROPONINI 0.06*   Lab Results  Component Value Date   WBC 4.7 06/27/2015   HGB 8.1* 06/27/2015   HCT 26.3* 06/27/2015   MCV 92.3 06/27/2015   PLT 204 06/27/2015    Recent Labs Lab 06/27/15 0546  NA 138  K 2.7*  CL 99*  CO2 27  BUN 16  CREATININE 2.04*  CALCIUM 8.7*  PROT 6.1*  BILITOT 0.9  ALKPHOS 59  ALT 25  AST 36  GLUCOSE 89   Lab Results  Component Value Date   CHOL 150 02/21/2015   HDL 48 02/21/2015   LDLCALC 89 02/21/2015   TRIG 65 02/21/2015   Lab Results  Component Value Date   DDIMER 1.32* 07/11/2015   Invalid input(s): POCBNP  Radiology/Studies  Dg Chest 2 View  07/21/2015  CLINICAL DATA:  Chest pain, shortness of breath and cough. EXAM: CHEST - 2 VIEW COMPARISON:  04/13/2015 FINDINGS: Stable moderate cardiac enlargement appearance of a dual-chamber pacing/ICD device. There is no evidence of pulmonary edema, consolidation, pneumothorax, nodule or  pleural fluid. IMPRESSION: Stable cardiac enlargement and appearance of pacer/ICD.  No acute edema identified. Electronically Signed   By: Aletta Edouard M.D.   On: 06/27/2015 18:42   Echocardiogram - 02/22/2015 - Left ventricle: Wall thickness was increased in a pattern of  moderate LVH. Systolic function was moderately reduced. The  estimated ejection fraction was in the range of 35% to 40%. - Mitral valve: There was moderate regurgitation. - Left atrium: The atrium was severely dilated. - Pulmonary arteries: Systolic pressure was mildly increased. PA  peak pressure: 31 mm Hg (S).  ECG: SR, non-specific changes in the anterolateral leads    ASSESSMENT AND PLAN  1. Chest pain with history of CAD status post  ST elevation MI in November 2016 - patient's chest pain improved with nitroglycerin. D-dimer is elevated will get VQ scan. Troponin 0.06 -> 0.05 with Crea 2.0. No cath indicated, I would consider a stress test if no evidence of PE, ECG is unchanged from prior.  Continue Coreg and statins. INR 3.5, no heparin needed. Keep Hb > 9.  Echocardiogram pending  2. Chronic systolic heart failure last EF measured in November 2016 was 35-40% status post AICD placement appears euvolemic - continue torsemide. 3. Paroxysmal atrial fibrillation chads 2 vasc score is 4 and patient is on Coumadin. Continue rate limiting medications. Presently in sinus rhythm. 4. Chronic kidney disease stage IV - closely follow metabolic panel. 5. Chronic anemia with history of GI bleed and workup in January 2016 was unremarkable for signs of GI bleed. Workup included EGD and colonoscopy. Stool for occult blood has been negative today. Needs to have Hb > 9.  6. Diabetes mellitus type 2 - on sliding scale coverage. Last hemoglobin A1c recorded in the chart is 5.8. 7. Hyperlipidemia on statins. 8. Hypothyroidism on Synthroid.  9. Hypokalemia - being replaced.   This is a very complicated patient, > 45 minutes spent with chart review only, another 30 minutes with the patient.    Signed, Dorothy Spark, MD, [facility] 06/27/2015, 11:19 AM

## 2015-06-27 NOTE — Progress Notes (Signed)
  Echocardiogram 2D Echocardiogram has been performed.  Darlina Sicilian M 06/27/2015, 2:28 PM

## 2015-06-27 NOTE — ED Notes (Signed)
Phlebotomy at bedside at this time.

## 2015-06-27 NOTE — Progress Notes (Signed)
Call placed to Barrett PA with results of ECHO.  Per PA, put Pt on clear liquids after midnight and she will notify attending of results in case attending wants to proceed with heart cath.  Orders placed.  Cont to monitor.

## 2015-06-27 NOTE — Progress Notes (Signed)
This admission has been reviewed and determined not to meet inpatient level of care. Both attending Physician and Medical Director are in agreement this should be an Observation encounter according to the Medicare Conditions of Participation as set forth in CFR 42 Chapter 456 482.12 (c) and the Medicare Condition Code-44 Regulations CFR 42 Chapter 100 - 04 50.3. The Patient and/or Patient Representative was notified via delivery of the "MEDICARE OBSERVATION STATUS NOTIFICATION".              

## 2015-06-27 NOTE — H&P (Addendum)
[facility] History and Physical  [name] [id number] DOB: 03-01-1951 DOA: 07/11/2015  Referring physician: Ms.Gail. PCP: Pcp Not In System  Specialists: [facility].  Chief Complaint: Chest pain.  HPI: [name] Terry is a 65 y.o. female with history of CAD status post stenting, chronic systolic heart failure status post ICD placement, proximal respiratory ablation, diabetes mellitus, chronic anemia, chronic kidney disease stage IV process to the ER because of chest pain. Patient has been having chest pain since yesterday morning. Pain is mostly on the left side of the chest radiating to her lower back. Had one episode of nausea vomiting and has been exertional shortness of breath. Pain persisted throughout the day. EKG was showing nonspecific changes and chest x-ray was unremarkable. Troponins are negative. Patient's chest pain improved with sublingual nitroglycerin. Patient's d-dimer is elevated. Since patient has chronic kidney disease will need VQ scan and cannot get CAT scan to rule out PE. Patient states she was switched from Eliquis to Coumadin while she was in a nursing home. Stool for occult blood has been negative.   Review of Systems: As presented in the history of presenting illness, rest negative.  Past Medical History  Diagnosis Date  . Ischemic cardiomyopathy     a. s/p MDT dual chamber ICD implanted 2013 by Dr Westley Gambles at [facility], now followed by Dr Rayann Heman. b. LVEF 30-35% by echo 01/2015 at [facility] per report; 35-40% by echo 01/2015 at [facility].  Marland Kitchen History of stroke      a. 1993  . Coronary artery disease     a. s/p previous interventions in [place] per patient, no records available. b. cath 04/2013 with non-obstructive disease. c. STEMI 01/2015 @ [place] - cardiac cath had shown 100% stenosis of prox PDA, patent stent in RCA with otherwise normal coronary arteries.  . Lymphoma ([place])     a. s/p chemo/radiation 2009  . Chronic systolic CHF (congestive heart failure)  ([place])   . HTN (hypertension)   . HLD (hyperlipidemia)   . Ventricular tachycardia ([place])     a. CL 300 msec requiring ICD shocks therpay 5/14 b. recurrent VT 04/2014, placed on amiodarone c. recurrent VT 05/2014 s/p ablation by Dr Rayann Heman d. s/p repeat ablation 07/2014  . DM2 (diabetes mellitus, type 2), newly diagnosed    . CKD (chronic kidney disease) stage 3, GFR 30-59 ml/min    . Polymyalgia rheumatica ([place])   . Depression   . GERD (gastroesophageal reflux disease)   . Renal atrophy, left     a. [place] 02/2015: "Left renal atrophy, likely from chronic renal arterial stenosis"  . Mitral regurgitation     a. mod by echo 01/2015.  . Diverticulosis     a. By CT 02/2015.  Marland Kitchen PAF (paroxysmal atrial fibrillation) ([place])     a. Brief paroxysms during admission 02/2015 - amiodarone increased. Not placed on anticoag due to dual antiplatelet therapy and bleeding risk including in the setting of possible GI pathology.   . Hiatal hernia     a. by EGD 06/2014.  Marland Kitchen Gastritis     a. by EGD 06/2014.  Marland Kitchen Anxiousness 03/02/2015   Past Surgical History  Procedure Laterality Date  . Cardiac defibrillator placement  03/2011    MDT ICD implanted at [facility] by Dr Tana Coast  . Coronary angioplasty with stent placement    . Left and right heart catheterization with coronary angiogram N/A 05/03/2013    non-obstructive CAD  . V-tach ablation N/A 05/26/2014    VT  ablation by Dr Rayann Heman - PVCs arising from the inferolateral LV, extensive substrate ablation  . Electrophysiologic study N/A 08/04/2014    Procedure: V Tach Ablation;  Surgeon: Thompson Grayer, MD;  Location: [place] [facility];  Service: Cardiovascular;  Laterality: N/A;  . Esophagogastroduodenoscopy (egd) with propofol N/A 04/03/2015    Dr. Oletta Lamas: normal  . Colonoscopy N/A 04/07/2015    Dr. Oletta Lamas: diverticulosis, internal hemorrhoids   . Pacemaker/defibrillator     Social History:  reports that she quit smoking about 13 months ago. She has never used smokeless  tobacco. She reports that she does not drink alcohol or use illicit drugs. Where does patient live Nursing home. Can patient participate in ADLs? Yes.  Allergies  Allergen Reactions  . Flagyl [Metronidazole] Swelling and Rash    Face swells   . Contrast Media [Iodinated Diagnostic Agents] Rash  . Ioxaglate Rash  . Potassium Sulfate Rash and Other (See Comments)    Headaches  . Potassium-Containing Compounds Other (See Comments)    Headaches-- reports no problems now    Family History:  Family History  Problem Relation Age of Onset  . Diabetes Father   . Hypertension Father   . Heart disease Father   . Alcoholism Father   . Hypertension Mother   . Heart disease Mother   . Breast cancer Mother   . Stroke Mother   . Diabetes Brother   . Diabetes Brother   . Diabetes Brother   . Diabetes Brother   . Hypertension Sister   . Heart disease Sister   . Alcoholism Sister   . Thyroid disease Sister   . Heart attack Sister   . Heart attack Brother   . Stroke Brother   . Colon cancer Neg Hx       Prior to Admission medications   Medication Sig Start Date End Date Taking? Authorizing Provider  allopurinol (ZYLOPRIM) 100 MG tablet Take 100 mg by mouth daily.   Yes Historical Provider, MD  ALPRAZolam (XANAX) 0.25 MG tablet Take 1-2 tablets (0.25-0.5 mg total) by mouth 3 (three) times daily as needed for anxiety. 04/19/15  Yes Kathie Dike, MD  amiodarone (PACERONE) 200 MG tablet Take 1 tablet (200 mg total) by mouth 2 (two) times daily. Patient taking differently: Take 200 mg by mouth daily.  02/28/15  Yes Dayna N Dunn, PA-C  aspirin EC 81 MG tablet Take 81 mg by mouth daily.   Yes Historical Provider, MD  atorvastatin (LIPITOR) 40 MG tablet Take 1 tablet (40 mg total) by mouth daily at 6 PM. 06/20/14  Yes Thompson Grayer, MD  carvedilol (COREG) 6.25 MG tablet Take 1 tablet (6.25 mg total) by mouth 2 (two) times daily with a meal. 04/08/15  Yes Costin Karlyne Greenspan, MD  colchicine 0.6 MG  tablet Take 0.6 mg by mouth 2 (two) times daily.   Yes Historical Provider, MD  ferrous sulfate 325 (65 FE) MG tablet Take 325 mg by mouth 2 (two) times daily with a meal.   Yes Historical Provider, MD  hydrALAZINE (APRESOLINE) 25 MG tablet Take 25 mg by mouth 3 (three) times daily.   Yes Historical Provider, MD  isosorbide dinitrate (ISORDIL) 20 MG tablet Take 20 mg by mouth 3 (three) times daily.   Yes Historical Provider, MD  levothyroxine (SYNTHROID, LEVOTHROID) 50 MCG tablet Take 1 tablet (50 mcg total) by mouth daily before breakfast. Patient taking differently: Take 75 mcg by mouth daily before breakfast.  03/12/15  Yes Allie Bossier,  MD  Multiple Vitamin (MULTI VITAMIN DAILY PO) Take 1 tablet by mouth daily.   Yes Historical Provider, MD  nitroGLYCERIN (NITROSTAT) 0.4 MG SL tablet Place 1 tablet (0.4 mg total) under the tongue every 5 (five) minutes as needed for chest pain. 07/04/14  Yes Amber Sena Slate, NP  oxyCODONE-acetaminophen (PERCOCET/ROXICET) 5-325 MG tablet Take 1 tablet by mouth every 8 (eight) hours as needed for severe pain. 04/08/15  Yes Costin Karlyne Greenspan, MD  pantoprazole (PROTONIX) 40 MG tablet Take 1 tablet (40 mg total) by mouth 2 (two) times daily. Patient taking differently: Take 40 mg by mouth daily.  02/28/15  Yes Dayna N Dunn, PA-C  PARoxetine (PAXIL) 10 MG tablet Take 1 tablet (10 mg total) by mouth daily. 03/12/15  Yes Allie Bossier, MD  potassium chloride SA (K-DUR,KLOR-CON) 20 MEQ tablet Take 20 mEq by mouth 3 (three) times a week. Monday, Wednesday and Friday   Yes Historical Provider, MD  promethazine (PHENERGAN) 25 MG tablet Take 25 mg by mouth every 6 (six) hours as needed for nausea or vomiting.   Yes Historical Provider, MD  QUEtiapine (SEROQUEL) 25 MG tablet Take 25 mg by mouth at bedtime.   Yes Historical Provider, MD  ranitidine (ZANTAC) 150 MG tablet Take 150 mg by mouth at bedtime.   Yes Historical Provider, MD  senna-docusate (SENOKOT-S) 8.6-50 MG tablet  Take 1 tablet by mouth 2 (two) times daily as needed for mild constipation.    Yes Historical Provider, MD  sodium chloride (OCEAN) 0.65 % SOLN nasal spray Place 1 spray into both nostrils daily as needed for congestion.   Yes Historical Provider, MD  torsemide (DEMADEX) 20 MG tablet Take 1 tablet (20 mg total) by mouth daily. Patient taking differently: Take 20 mg by mouth 3 (three) times a week. Monday, Wednesday and Friday 03/12/15  Yes Allie Bossier, MD  warfarin (COUMADIN) 5 MG tablet Take 5 mg by mouth at bedtime.   Yes Historical Provider, MD    Physical Exam: Filed Vitals:   06/27/15 0130 06/27/15 0335 06/27/15 0357 06/27/15 0404  BP: 159/100 161/97 138/78 132/79  Pulse: 87 90 85 86  Temp:  98.8 F (37.1 C)    TempSrc:  Oral    Resp: 25 20 20 20   Height:  5\' 5"  (1.651 m)    Weight:  182 lb 9.6 oz (82.827 kg)    SpO2: 100% 100%       General:  Moderately built and poorly nourished.  Eyes: Anicteric. No pallor.  ENT: No discharge from the ears eyes nose and mouth.  Neck: No JVD appreciated no mass felt.  Cardiovascular: S1 and S2 heard.  Respiratory: No rhonchi or crepitations.  Abdomen: Soft nontender bowel sounds present.  Skin: No rash.  Musculoskeletal: Mild edema.  Psychiatric: Appears normal.  Neurologic: Alert awake oriented to time place and person. Moves all extremities.  Labs on Admission:  Basic Metabolic Panel:  Recent Labs Lab 07/02/2015 1832  NA 140  K 3.2*  CL 100*  CO2 27  GLUCOSE 132*  BUN 16  CREATININE 2.08*  CALCIUM 9.2   Liver Function Tests: No results for input(s): AST, ALT, ALKPHOS, BILITOT, PROT, ALBUMIN in the last 168 hours. No results for input(s): LIPASE, AMYLASE in the last 168 hours. No results for input(s): AMMONIA in the last 168 hours. CBC:  Recent Labs Lab 07/18/2015 1832  WBC 5.8  HGB 8.9*  HCT 29.0*  MCV 92.4  PLT 231   Cardiac  Enzymes: No results for input(s): CKTOTAL, CKMB, CKMBINDEX, TROPONINI in the  last 168 hours.  BNP (last 3 results)  Recent Labs  02/21/15 1042 03/01/15 0839 04/13/15 2215  BNP 386.1* 452.0* 894.0*    ProBNP (last 3 results) No results for input(s): PROBNP in the last 8760 hours.  CBG: No results for input(s): GLUCAP in the last 168 hours.  Radiological Exams on Admission: Dg Chest 2 View  07/02/2015  CLINICAL DATA:  Chest pain, shortness of breath and cough. EXAM: CHEST - 2 VIEW COMPARISON:  04/13/2015 FINDINGS: Stable moderate cardiac enlargement appearance of a dual-chamber pacing/ICD device. There is no evidence of pulmonary edema, consolidation, pneumothorax, nodule or pleural fluid. IMPRESSION: Stable cardiac enlargement and appearance of pacer/ICD. No acute edema identified. Electronically Signed   By: Aletta Edouard M.D.   On: 07/07/2015 18:42    EKG: Independently reviewed. Sinus rhythm with nonspecific ST changes.  Assessment/Plan Principal Problem:   Chest pain Active Problems:   HTN (hypertension)   Cardiomyopathy, ischemic-EF 35-40%   CAD S/P prior RCA PCI, cath 05/03/2013- med Rx, STEMI 01/2015 - 100% prox PDA, treated medically   DM2 with nephropathy   Persistent atrial fibrillation ([place])   1. Chest pain with history of CAD status post stenting in 2015 and has had history of ST elevation MI in November 2016 - patient's chest pain improved with nitroglycerin. D-dimer is elevated will get VQ scan. Cycle cardiac markers. I have requested cardiology consult. Patient is on aspirin. Patient is also on Coumadin and his INR is subtherapeutic we will start heparin. Continue Coreg and statins. 2. Chronic systolic heart failure last EF measured in November 2016 was 35-40% status post AICD placement appears compensated - continue torsemide. 3. Paroxysmal atrial fibrillation chads 2 vasc score is 4 and patient is on Coumadin. Continue rate limiting medications. Presently in sinus rhythm. 4. Chronic kidney disease stage IV - closely follow metabolic  panel. 5. Chronic anemia with history of GI bleed and workup in January 2016 was unremarkable for Sosa of GI bleed. Workup included EGD and colonoscopy. Stool for occult blood has been negative today. 6. Diabetes mellitus type 2 - on sliding scale coverage. Last hemoglobin A1c recorded in the chart is 5.8. 7. Hyperlipidemia on statins. 8. Hypothyroidism on Synthroid.   DVT Prophylaxis Coumadin/heparin.  Code Status: Full code.  Family Communication: Discussed with patient.  Disposition Plan: Admit for observation.    Limuel Nieblas N. [facility] Pager [phone].  If 7PM-7AM, please contact night-coverage [email or web address] Password [facility] 06/27/2015, 5:28 AM

## 2015-06-27 NOTE — Progress Notes (Signed)
[name] [id number] DOB: 25-May-1950 DOA: 07/08/2015 PCP: Pcp Not In System Admit HPI / Brief Narrative: [name] Terry is a 65 y.o. BF PMHx Anxiety, CAD native artery, S/P stenting, Chronic Systolic CHF S/P ICD placement, proximal respiratory ablation, DM Type 2 with complications  Chronic anemia, CKD Stage IV   Presents to the ER because of chest pain. Patient has been having chest pain since yesterday morning. Pain is mostly on the left side of the chest radiating to her lower back. Had one episode of nausea vomiting and has been exertional shortness of breath. Pain persisted throughout the day. EKG was showing nonspecific changes and chest x-ray was unremarkable. Troponins are negative. Patient's chest pain improved with sublingual nitroglycerin. Patient's d-dimer is elevated. Since patient has chronic kidney disease will need VQ scan and cannot get CT scan to rule out PE. Patient states she was switched from Eliquis to Coumadin while she was in a nursing home. Stool for occult blood has been negative.   HPI/Subjective: 4/4 A/O 4, was at SNF attempting to ambulate from bed to bathroom when she began to have CP/SOB. States normally on 2-3 L O2.     Assessment/Plan: Chest pain/CAD status post stenting in 2015 -Most likely multifactorial to include anxiety, anemia, possible ACS (less likely), metabolic derangements and A. fib RVR all the patient currently rate controlled -Echocardiogram pending -VQ scan pending  Chronic Systolic CHF  - EF measured in November 2016 was 35-40% status post AICD placement appears compensated  -Amiodarone 200 mg daily -Coreg 6.25 mg BID -Hydralazine 25 mg TID -Isosorbide dinitrate 20 mg TID -Torsemide. 20 mg M/W/F -Transfuse for hemoglobin<8 -4/4 transfuse 1 unit PRBC  Paroxysmal atrial fibrillation chads 2 vasc score is 4  -and patient is on Coumadin.  -See chronic systolic CHF  -Heparin/Coumadin per pharmacy   - Chronic kidney disease stage IV  - closely follow metabolic panel.  Chronic anemia with history of GI bleed  -workup in January 2016 was unremarkable for signs of GI bleed. Workup included EGD and colonoscopy. Stool for occult blood has been negative today.  Diabetes mellitus type 2 controlled with complications - Last hemoglobin A1c recorded in the chart is 5.8. -A1c pending -Lipid panel pending   Hyperlipidemia - Lipitor 40 mg daily  Hypothyroidism  -Levothyroxine 75 g daily  -TSH elevated -Free T4 pending .  Hypokalemia -Potassium goal> 4 -Potassium 50 mEq (20 mEq IV + 30 mEq K-Dur)  Hypomagnesemia -Magnesium goal> 2 -Magnesium IV 2 gm   Code Status: Full Family Communication: None  Disposition Plan: Discharge next 24-48 hours   Consultants: [place] cardiology   Procedures:  Cultures NA   Antibiotics: NA   DVT prophylaxis  Heparin/Coumadin per pharmacy   Objective: Filed Vitals:   06/27/15 0130 06/27/15 0335 06/27/15 0357 06/27/15 0404  BP: 159/100 161/97 138/78 132/79  Pulse: 87 90 85 86  Temp:  98.8 F (37.1 C)    TempSrc:  Oral    Resp: 25 20 20 20   Height:  5\' 5"  (1.651 m)    Weight:  82.827 kg (182 lb 9.6 oz)    SpO2: 100% 100%      Intake/Output Summary (Last 24 hours) at 06/27/15 0758 Last data filed at 06/27/15 0600  Gross per 24 hour  Intake      0 ml  Output      0 ml  Net      0 ml   Danley Danker  Weights   06/27/15 0335  Weight: 82.827 kg (182 lb 9.6 oz)     Exam: General: A/O 4, positive Acute on Chronic Respiratory distress Eyes: Negative headache, double vision,negative scleral hemorrhage ENT: Negative Runny nose, negative gingival bleeding, Neck:  Negative scars, masses, torticollis, lymphadenopathy, JVD Lungs: Clear to auscultation bilaterally without wheezes or crackles Cardiovascular: Regular rate and rhythm without murmur gallop or rub normal S1 and S2 Abdomen:negative abdominal pain, negative  dysphagia, nondistended, positive soft, bowel sounds, no rebound, no ascites, no appreciable mass, left chest wall AICD present Extremities: No significant cyanosis, clubbing, or edema bilateral lower extremities Psychiatric:  Negative depression, positive anxiety, negative fatigue, negative mania  Neurologic:  Cranial nerves II through XII intact, tongue/uvula midline, all extremities muscle strength 5/5, sensation intact throughout, negative dysarthria, negative expressive aphasia, negative receptive aphasia.     Data Reviewed: Basic Metabolic Panel:  Recent Labs Lab 06/28/2015 1832 06/27/15 0546  NA 140 138  K 3.2* 2.7*  CL 100* 99*  CO2 27 27  GLUCOSE 132* 89  BUN 16 16  CREATININE 2.08* 2.04*  CALCIUM 9.2 8.7*   Liver Function Tests:  Recent Labs Lab 06/27/15 0546  AST 36  ALT 25  ALKPHOS 59  BILITOT 0.9  PROT 6.1*  ALBUMIN 2.7*    Recent Labs Lab 06/27/15 0546  LIPASE 36   No results for input(s): AMMONIA in the last 168 hours. CBC:  Recent Labs Lab 07/13/2015 1832 06/27/15 0546  WBC 5.8 4.7  NEUTROABS  --  3.3  HGB 8.9* 8.1*  HCT 29.0* 26.3*  MCV 92.4 92.3  PLT 231 204   Cardiac Enzymes:  Recent Labs Lab 06/27/15 0546  TROPONINI 0.06*   BNP (last 3 results)  Recent Labs  02/21/15 1042 03/01/15 0839 04/13/15 2215  BNP 386.1* 452.0* 894.0*    ProBNP (last 3 results) No results for input(s): PROBNP in the last 8760 hours.  CBG:  Recent Labs Lab 06/27/15 0652  GLUCAP 77    Recent Results (from the past 240 hour(s))  MRSA PCR Screening     Status: None   Collection Time: 06/27/15  3:54 AM  Result Value Ref Range Status   MRSA by PCR NEGATIVE NEGATIVE Final    Comment:        The GeneXpert MRSA Assay (FDA approved for NASAL specimens only), is one component of a comprehensive MRSA colonization surveillance program. It is not intended to diagnose MRSA infection nor to guide or monitor treatment for MRSA infections.       Studies: Dg Chest 2 View  07/09/2015  CLINICAL DATA:  Chest pain, shortness of breath and cough. EXAM: CHEST - 2 VIEW COMPARISON:  04/13/2015 FINDINGS: Stable moderate cardiac enlargement appearance of a dual-chamber pacing/ICD device. There is no evidence of pulmonary edema, consolidation, pneumothorax, nodule or pleural fluid. IMPRESSION: Stable cardiac enlargement and appearance of pacer/ICD. No acute edema identified. Electronically Signed   By: Aletta Edouard M.D.   On: 07/18/2015 18:42    Scheduled Meds: . allopurinol  100 mg Oral Daily  . amiodarone  200 mg Oral Daily  . aspirin EC  81 mg Oral Daily  . atorvastatin  40 mg Oral q1800  . carvedilol  6.25 mg Oral BID WC  . colchicine  0.6 mg Oral BID  . famotidine  20 mg Oral BID  . ferrous sulfate  325 mg Oral BID WC  . hydrALAZINE  25 mg Oral 3 times per day  . insulin aspart  0-9 Units Subcutaneous TID WC  . isosorbide dinitrate  20 mg Oral TID  . levothyroxine  75 mcg Oral QAC breakfast  . pantoprazole  40 mg Oral Daily  . PARoxetine  10 mg Oral Daily  . potassium chloride  10 mEq Intravenous Q1 Hr x 5  . [START ON 06/28/2015] potassium chloride SA  20 mEq Oral Once per day on Mon Wed Fri  . QUEtiapine  25 mg Oral QHS  . sodium chloride flush  3 mL Intravenous Q12H  . [START ON 06/28/2015] torsemide  20 mg Oral Once per day on Mon Wed Fri   Continuous Infusions:   Principal Problem:   Chest pain Active Problems:   HTN (hypertension)   Cardiomyopathy, ischemic-EF 35-40%   CAD S/P prior RCA PCI, cath 05/03/2013- med Rx, STEMI 01/2015 - 100% prox PDA, treated medically   DM2 with nephropathy   Persistent atrial fibrillation ([facility])    Time spent: 40 minutes    Rett Stehlik, [name] Terry Pager [phone]. If 7PM-7AM, please contact night-coverage at [email or web address], password [facility] 06/27/2015, 7:58 AM  LOS: 0 days    Care during the described time interval was provided by me .  I have reviewed this patient's  available data, including medical history, events of note, physical examination, and all test results as part of my evaluation. I have personally reviewed and interpreted all radiology studies.   Dia Crawford, MD [phone] Pager

## 2015-06-27 NOTE — ED Notes (Signed)
Paged IV team 

## 2015-06-27 NOTE — Progress Notes (Signed)
Initial Nutrition Assessment  DOCUMENTATION CODES:   Not applicable  INTERVENTION:  -Ensure Enlive po BID, each supplement provides 350 kcal and 20 grams of protein w/ diet advancement   NUTRITION DIAGNOSIS:   Inadequate oral intake related to poor appetite as evidenced by per patient/family report.  GOAL:   Patient will meet greater than or equal to 90% of their needs  MONITOR:   PO intake, Supplement acceptance, Labs, I & O's, Skin  REASON FOR ASSESSMENT:   Malnutrition Screening Tool    ASSESSMENT:   [name] Terry is a 65 y.o. female with history of CAD status post stenting, chronic systolic heart failure status post ICD placement, proximal respiratory ablation, diabetes mellitus, chronic anemia, chronic kidney disease stage IV process to the ER because of chest pain. Patient has been having chest pain since yesterday morning  Spoke to [name] Terry at bedside. She endorses poor appetite PTA r/t generalized sickness, not feeling well. She endorses wt loss, claims normal wt of 192# in November. She states that at her facility prior to admission, she couldn't eat the food because it "Didn't sit well with her." She states she would have her children bring in food "hot dogs, pizza with soda." Per chart review, she is down 18#/9% in 4 months. She exhibits mild muscle wasting at her calves, Lower extremities.   She states "I am very hungry." Pt has been NPO going on 2 days now. Will continue to follow for PO intake, diet advancement.  Labs: K 2.7, Mg 1.6 Medications reviewed.   Diet Order:  Diet NPO time specified Except for: Sips with Meds  Skin:  Reviewed, no issues  Last BM:  4/3  Height:   Ht Readings from Last 1 Encounters:  06/27/15 5\' 5"  (1.651 m)    Weight:   Wt Readings from Last 1 Encounters:  06/27/15 182 lb 9.6 oz (82.827 kg)    Ideal Body Weight:  56.81 kg  BMI:  Body mass index is 30.39 kg/(m^2).  Estimated Nutritional Needs:   Kcal:   1600-1900 (25-30 cal/kg ABW)  Protein:  80-100 grams  Fluid:  >/= 1.6L  EDUCATION NEEDS:   No education needs identified at this time  [name] Terry. [name] Mcclenathan, MS, RD LDN After Hours/Weekend Pager [phone]

## 2015-06-27 NOTE — Care Management Obs Status (Signed)
[place] NOTIFICATION   Patient Details  Name: [name] Terry MRN: [id number] Date of Birth: [birth year]   Medicare Observation Status Notification Given:  Yes (CC44 given)    Dawayne Patricia, RN 06/27/2015, 3:15 PM

## 2015-06-27 NOTE — Care Management Note (Signed)
Case Management Note Marvetta Gibbons RN, BSN Unit 2W-Case Manager [phone]  Patient Details  Name: [name] Terry MRN: [id number] Date of Birth: 1950/06/11  Subjective/Objective:  Pt admitted with chest pain                Action/Plan: PTA pt was at [facility]- for rehab- per pt she was living at home with sister nearby prior to going to [facility]- not sure if she wants to return to rehab- could use a PT eval to help assist in determining  d/c needs.   Expected Discharge Date:                  Expected Discharge Plan:  Skilled Nursing Facility  In-House Referral:  Clinical Social Work  Discharge planning Services  CM Consult  Post Acute Care Choice:    Choice offered to:     DME Arranged:    DME Agency:     HH Arranged:    [place] Agency:     Status of Service:  In process, will continue to follow  Medicare Important Message Given:    Date Medicare IM Given:    Medicare IM give by:    Date Additional Medicare IM Given:    Additional Medicare Important Message give by:     If discussed at [place] of Stay Meetings, dates discussed:    Additional Comments:  Dawayne Patricia, RN 06/27/2015, 3:16 PM

## 2015-06-27 NOTE — Progress Notes (Signed)
K Kirby N.P. Aware of accu ck 77 and pt. NPO . Repeat accu ck in 1 hour and call m.d. If less than 70

## 2015-06-27 NOTE — ED Notes (Signed)
Daughter - Jordan Hawks [phone]) or daughter, Ranelle Oyster [phone]).

## 2015-06-28 ENCOUNTER — Observation Stay (HOSPITAL_COMMUNITY): Payer: Medicare Other

## 2015-06-28 DIAGNOSIS — I209 Angina pectoris, unspecified: Secondary | ICD-10-CM | POA: Diagnosis not present

## 2015-06-28 DIAGNOSIS — I255 Ischemic cardiomyopathy: Secondary | ICD-10-CM | POA: Diagnosis not present

## 2015-06-28 DIAGNOSIS — I5022 Chronic systolic (congestive) heart failure: Secondary | ICD-10-CM | POA: Diagnosis not present

## 2015-06-28 DIAGNOSIS — R072 Precordial pain: Secondary | ICD-10-CM | POA: Diagnosis not present

## 2015-06-28 DIAGNOSIS — I2510 Atherosclerotic heart disease of native coronary artery without angina pectoris: Secondary | ICD-10-CM | POA: Diagnosis not present

## 2015-06-28 LAB — COMPREHENSIVE METABOLIC PANEL
ALBUMIN: 2.6 g/dL — AB (ref 3.5–5.0)
ALK PHOS: 61 U/L (ref 38–126)
ALT: 28 U/L (ref 14–54)
AST: 41 U/L (ref 15–41)
Anion gap: 10 (ref 5–15)
BILIRUBIN TOTAL: 0.8 mg/dL (ref 0.3–1.2)
BUN: 18 mg/dL (ref 6–20)
CO2: 28 mmol/L (ref 22–32)
Calcium: 9 mg/dL (ref 8.9–10.3)
Chloride: 103 mmol/L (ref 101–111)
Creatinine, Ser: 2.41 mg/dL — ABNORMAL HIGH (ref 0.44–1.00)
GFR calc Af Amer: 23 mL/min — ABNORMAL LOW (ref >60)
GFR calc non Af Amer: 20 mL/min — ABNORMAL LOW (ref >60)
GLUCOSE: 123 mg/dL — AB (ref 65–99)
POTASSIUM: 3.8 mmol/L (ref 3.5–5.1)
SODIUM: 141 mmol/L (ref 135–145)
TOTAL PROTEIN: 6.4 g/dL — AB (ref 6.5–8.1)

## 2015-06-28 LAB — CBC WITH DIFFERENTIAL/PLATELET
BASOS ABS: 0 10*3/uL (ref 0.0–0.1)
Basophils Relative: 1 %
EOS PCT: 2 %
Eosinophils Absolute: 0.1 10*3/uL (ref 0.0–0.7)
HCT: 28 % — ABNORMAL LOW (ref 36.0–46.0)
Hemoglobin: 9.2 g/dL — ABNORMAL LOW (ref 12.0–15.0)
LYMPHS PCT: 19 %
Lymphs Abs: 0.7 10*3/uL (ref 0.7–4.0)
MCH: 30.4 pg (ref 26.0–34.0)
MCHC: 32.9 g/dL (ref 30.0–36.0)
MCV: 92.4 fL (ref 78.0–100.0)
MONO ABS: 0.4 10*3/uL (ref 0.1–1.0)
MONOS PCT: 12 %
Neutro Abs: 2.6 10*3/uL (ref 1.7–7.7)
Neutrophils Relative %: 66 %
PLATELETS: 180 10*3/uL (ref 150–400)
RBC: 3.03 MIL/uL — ABNORMAL LOW (ref 3.87–5.11)
RDW: 18.7 % — AB (ref 11.5–15.5)
WBC: 3.8 10*3/uL — ABNORMAL LOW (ref 4.0–10.5)

## 2015-06-28 LAB — GLUCOSE, CAPILLARY
GLUCOSE-CAPILLARY: 125 mg/dL — AB (ref 65–99)
Glucose-Capillary: 118 mg/dL — ABNORMAL HIGH (ref 65–99)
Glucose-Capillary: 107 mg/dL — ABNORMAL HIGH (ref 65–99)
Glucose-Capillary: 91 mg/dL (ref 65–99)

## 2015-06-28 LAB — C DIFFICILE QUICK SCREEN W PCR REFLEX
C Diff antigen: POSITIVE — AB
C Diff toxin: NEGATIVE

## 2015-06-28 LAB — PROTIME-INR
INR: 3.64 — AB (ref 0.00–1.49)
PROTHROMBIN TIME: 35.4 s — AB (ref 11.6–15.2)

## 2015-06-28 LAB — LIPID PANEL
CHOL/HDL RATIO: 3.1 ratio
CHOLESTEROL: 141 mg/dL (ref 0–200)
HDL: 46 mg/dL (ref >40)
LDL Cholesterol: 80 mg/dL (ref 0–99)
Triglycerides: 74 mg/dL (ref <150)
VLDL: 15 mg/dL (ref 0–40)

## 2015-06-28 LAB — TYPE AND SCREEN
ABO/RH(D): A POS
ANTIBODY SCREEN: NEGATIVE
Unit division: 0

## 2015-06-28 LAB — MAGNESIUM: Magnesium: 1.9 mg/dL (ref 1.7–2.4)

## 2015-06-28 LAB — T4, FREE: Free T4: 1.33 ng/dL — ABNORMAL HIGH (ref 0.61–1.12)

## 2015-06-28 MED ORDER — COLCHICINE 0.6 MG PO TABS
0.6000 mg | ORAL_TABLET | Freq: Every day | ORAL | Status: DC
  Administered 2015-06-28: 0.6 mg via ORAL
  Filled 2015-06-28 (×2): qty 1

## 2015-06-28 MED ORDER — FAMOTIDINE 20 MG PO TABS
20.0000 mg | ORAL_TABLET | Freq: Every day | ORAL | Status: DC
  Administered 2015-07-01 – 2015-07-04 (×4): 20 mg via ORAL
  Filled 2015-06-28 (×5): qty 1

## 2015-06-28 MED ORDER — LEVOTHYROXINE SODIUM 100 MCG PO TABS
100.0000 ug | ORAL_TABLET | Freq: Every day | ORAL | Status: DC
  Administered 2015-06-29 – 2015-07-04 (×6): 100 ug via ORAL
  Filled 2015-06-28 (×6): qty 1

## 2015-06-28 MED ORDER — SODIUM CHLORIDE 0.9 % IV SOLN
INTRAVENOUS | Status: DC
  Administered 2015-06-28: 16:00:00 via INTRAVENOUS

## 2015-06-28 MED ORDER — VITAMIN K1 10 MG/ML IJ SOLN
10.0000 mg | Freq: Once | INTRAMUSCULAR | Status: AC
  Administered 2015-06-28: 10 mg via SUBCUTANEOUS
  Filled 2015-06-28: qty 1

## 2015-06-28 MED ORDER — FUROSEMIDE 10 MG/ML IJ SOLN
40.0000 mg | Freq: Once | INTRAMUSCULAR | Status: AC
  Administered 2015-06-28: 40 mg via INTRAVENOUS
  Filled 2015-06-28: qty 4

## 2015-06-28 NOTE — Progress Notes (Signed)
[facility] PROGRESS NOTE  Gabrianna Bears Esch [id number] DOB: [birth year] DOA: 07/10/2015 PCP: Pcp Not In System Admit HPI / Brief Narrative: Lalah Cerro Luger is a 65 y.o. BF PMHx Anxiety, CAD native artery, S/P stenting, Chronic Systolic CHF S/P ICD placement, proximal respiratory ablation, DM Type 2 with complications  Chronic anemia, CKD Stage IV   Presents to the ER because of chest pain. Patient has been having chest pain since yesterday morning. Pain is mostly on the left side of the chest radiating to her lower back. Had one episode of nausea vomiting and has been exertional shortness of breath. Pain persisted throughout the day. EKG was showing nonspecific changes and chest x-ray was unremarkable. Troponins are negative. Patient's chest pain improved with sublingual nitroglycerin. Patient's d-dimer is elevated. Since patient has chronic kidney disease will need VQ scan and cannot get CT scan to rule out PE. Patient states she was switched from Eliquis to Coumadin while she was in a nursing home. Stool for occult blood has been negative.   HPI/Subjective: Extremely short of breath last pm, now better  Assessment/Plan: Chest pain/CAD status post stenting in 2015 -Troponin minimally elevated, but ECHO with inferior and inferolateral WMA, EF down to 20-25% from 35-40% in Nov -had inferior STEMI in Nov,- cardiac cath then showed 100% stenosis of prox PDA, patent stent in RCA with otherwise normal coronary arteries -VQ scan : negative for PE -per Cards -continue ASA/coreg/statin  Chronic Systolic CHF  - EF now down to 20-25% with new WMA - status post AICD placement -Amiodarone 200 mg daily - continue Coreg, Hydralazine, Isosorbide dinitrate 20 mg TID -Torsemide. 20 mg M/W/F -4/4 transfused 1 unit PRBC  Paroxysmal atrial fibrillation chads 2 vasc score is 4  -and patient is on Coumadin.  -See chronic systolic CHF  -Coumadin per pharmacy   Chronic kidney disease stage IV -baseline  2.8  - closely follow metabolic panel.  Chronic anemia with history of GI bleed  -workup in January 2016 was unremarkable for signs of GI bleed. Workup included EGD and colonoscopy. Stool for occult blood has been negative today. -s/p 1 unit PRBC 4/4  Diabetes mellitus type 2 controlled with complications - Last hemoglobin A1c recorded in the chart is 5.8. -A1c pending  Hyperlipidemia - Lipitor 40 mg daily  Hypothyroidism  -Levothyroxine 75 g daily  -TSH elevated, will increase dose to 153mcg  Hypokalemia -replaced  Hypomagnesemia -replaced  DVT proph: on warfarin  Code Status: Full Family Communication: None  Disposition Plan: back to SNF when cleared per Cards  Consultants: [place] cardiology   Procedures:  Cultures NA   Antibiotics: NA   DVT prophylaxis COumadin   Objective: Filed Vitals:   06/27/15 2046 06/28/15 0446 06/28/15 0500 06/28/15 0607  BP: 128/77 140/90  130/94  Pulse: 73 71    Temp: 98 F (36.7 C) 97.5 F (36.4 C)    TempSrc: Oral Oral    Resp: 18 18    Height:      Weight:   85.14 kg (187 lb 11.2 oz)   SpO2: 100% 100%      Intake/Output Summary (Last 24 hours) at 06/28/15 0805 Last data filed at 06/27/15 1553  Gross per 24 hour  Intake    670 ml  Output      0 ml  Net    670 ml   Filed Weights   06/27/15 0335 06/28/15 0500  Weight: 82.827 kg (182 lb 9.6 oz) 85.14 kg (187 lb 11.2  oz)     Exam: General: A/O 4, positive no distress Eyes: Negative headache, double vision,negative scleral hemorrhage ENT: Negative Runny nose, negative gingival bleeding, Neck:  Negative scars, masses, torticollis, lymphadenopathy, JVD Lungs: Clear to auscultation bilaterally without wheezes or crackles Cardiovascular: Regular rate and rhythm without murmur gallop or rub normal S1 and S2 Abdomen:negative abdominal pain, negative dysphagia, nondistended, positive soft, bowel sounds, no rebound, no ascites, no appreciable mass,  left chest wall AICD present Extremities: No significant cyanosis, clubbing, or edema bilateral lower extremities Psychiatric:  Negative depression, positive anxiety, negative fatigue, negative mania  Neurologic:  Cranial nerves II through XII intact, tongue/uvula midline, all extremities muscle strength 5/5, sensation intact throughout, negative dysarthria, negative expressive aphasia, negative receptive aphasia.     Data Reviewed: Basic Metabolic Panel:  Recent Labs Lab 07/18/2015 1832 06/27/15 0546 06/27/15 0806 06/28/15 0418  NA 140 138  --  141  K 3.2* 2.7*  --  3.8  CL 100* 99*  --  103  CO2 27 27  --  28  GLUCOSE 132* 89  --  123*  BUN 16 16  --  18  CREATININE 2.08* 2.04*  --  2.41*  CALCIUM 9.2 8.7*  --  9.0  MG  --   --  1.6* 1.9   Liver Function Tests:  Recent Labs Lab 06/27/15 0546 06/28/15 0418  AST 36 41  ALT 25 28  ALKPHOS 59 61  BILITOT 0.9 0.8  PROT 6.1* 6.4*  ALBUMIN 2.7* 2.6*    Recent Labs Lab 06/27/15 0546  LIPASE 36   No results for input(s): AMMONIA in the last 168 hours. CBC:  Recent Labs Lab 07/09/2015 1832 06/27/15 0546 06/28/15 0418  WBC 5.8 4.7 3.8*  NEUTROABS  --  3.3 2.6  HGB 8.9* 8.1* 9.2*  HCT 29.0* 26.3* 28.0*  MCV 92.4 92.3 92.4  PLT 231 204 180   Cardiac Enzymes:  Recent Labs Lab 06/27/15 0546 06/27/15 1025 06/27/15 1650  TROPONINI 0.06* 0.05* 0.05*   BNP (last 3 results)  Recent Labs  02/21/15 1042 03/01/15 0839 04/13/15 2215  BNP 386.1* 452.0* 894.0*    ProBNP (last 3 results) No results for input(s): PROBNP in the last 8760 hours.  CBG:  Recent Labs Lab 06/27/15 0827 06/27/15 1123 06/27/15 1631 06/27/15 2038 06/28/15 0607  GLUCAP 77 111* 74 102* 118*    Recent Results (from the past 240 hour(s))  MRSA PCR Screening     Status: None   Collection Time: 06/27/15  3:54 AM  Result Value Ref Range Status   MRSA by PCR NEGATIVE NEGATIVE Final    Comment:        The GeneXpert MRSA Assay  (FDA approved for NASAL specimens only), is one component of a comprehensive MRSA colonization surveillance program. It is not intended to diagnose MRSA infection nor to guide or monitor treatment for MRSA infections.      Studies: Dg Chest 2 View  07/12/2015  CLINICAL DATA:  Chest pain, shortness of breath and cough. EXAM: CHEST - 2 VIEW COMPARISON:  04/13/2015 FINDINGS: Stable moderate cardiac enlargement appearance of a dual-chamber pacing/ICD device. There is no evidence of pulmonary edema, consolidation, pneumothorax, nodule or pleural fluid. IMPRESSION: Stable cardiac enlargement and appearance of pacer/ICD. No acute edema identified. Electronically Signed   By: Aletta Edouard M.D.   On: 07/16/2015 18:42   Nm Pulmonary Perf And Vent  06/27/2015  CLINICAL DATA:  Shortness of breath, chest pain since yesterday EXAM: NUCLEAR MEDICINE  VENTILATION - PERFUSION LUNG SCAN TECHNIQUE: Ventilation images were obtained in multiple projections using inhaled aerosol Tc-37m DTPA. Perfusion images were obtained in multiple projections after intravenous injection of Tc-53m MAA. RADIOPHARMACEUTICALS:  31.2 mCi Technetium-37m DTPA aerosol inhalation and 4.2 mCi Technetium-53m MAA IV COMPARISON:  VQ scan 04/14/2015.  Chest x-ray 07/14/2015. FINDINGS: Ventilation: No focal ventilation defect. Perfusion: No wedge shaped peripheral perfusion defects to suggest acute pulmonary embolism. IMPRESSION: No evidence of pulmonary embolus. Electronically Signed   By: Rolm Baptise M.D.   On: 06/27/2015 15:56    Scheduled Meds: . allopurinol  100 mg Oral Daily  . amiodarone  200 mg Oral Daily  . aspirin EC  81 mg Oral Daily  . atorvastatin  40 mg Oral q1800  . carvedilol  6.25 mg Oral BID WC  . colchicine  0.6 mg Oral BID  . famotidine  20 mg Oral BID  . ferrous sulfate  325 mg Oral BID WC  . hydrALAZINE  25 mg Oral 3 times per day  . insulin aspart  0-9 Units Subcutaneous TID WC  . isosorbide dinitrate  20 mg  Oral TID  . levothyroxine  75 mcg Oral QAC breakfast  . pantoprazole  40 mg Oral Daily  . PARoxetine  10 mg Oral Daily  . potassium chloride SA  20 mEq Oral Once per day on Mon Wed Fri  . QUEtiapine  25 mg Oral QHS  . sodium chloride flush  3 mL Intravenous Q12H  . torsemide  20 mg Oral Once per day on Mon Wed Fri   Continuous Infusions:   Principal Problem:   Chest pain Active Problems:   HTN (hypertension)   Cardiomyopathy, ischemic-EF 35-40%   CAD S/P prior RCA PCI, cath 05/03/2013- med Rx, STEMI 01/2015 - 100% prox PDA, treated medically   DM2 with nephropathy   Persistent atrial fibrillation (HCC)   Chronic systolic CHF (congestive heart failure) ([place])   Chest pain at rest   Controlled diabetes mellitus type 2 with complications ([place])   Hypomagnesemia    Time spent: 40 minutes    [facility]  [facility] Pager [phone]. If 7PM-7AM, please contact night-coverage at [email or web address], password [facility] 06/28/2015, 8:05 AM  LOS: 1 day    Care during the described time interval was provided by me .  I have reviewed this patient's available data, including medical history, events of note, physical examination, and all test results as part of my evaluation. I have personally reviewed and interpreted all radiology studies.

## 2015-06-28 NOTE — Progress Notes (Signed)
[place] for coumadin Indication: afib  Allergies  Allergen Reactions  . Flagyl [Metronidazole] Swelling and Rash    Face swells   . Contrast Media [Iodinated Diagnostic Agents] Rash  . Ioxaglate Rash  . Potassium Sulfate Rash and Other (See Comments)    Headaches  . Potassium-Containing Compounds Other (See Comments)    Headaches-- reports no problems now    Patient Measurements: Height: 5\' 5"  (165.1 cm) Weight: 187 lb 11.2 oz (85.14 kg) IBW/kg (Calculated) : 57 Heparin Dosing Weight: 74.7kg  Vital Signs: Temp: 97.5 F (36.4 C) (04/05 0446) Temp Source: Oral (04/05 0446) BP: 130/94 mmHg (04/05 0607) Pulse Rate: 71 (04/05 0446)  Labs:  Recent Labs  07/04/2015 1832 06/27/15 0546 06/27/15 1025 06/27/15 1650 06/28/15 0418 06/28/15 0914  HGB 8.9* 8.1*  --   --  9.2*  --   HCT 29.0* 26.3*  --   --  28.0*  --   PLT 231 204  --   --  180  --   LABPROT  --  34.8*  --   --   --  35.4*  INR  --  3.56*  --   --   --  3.64*  CREATININE 2.08* 2.04*  --   --  2.41*  --   TROPONINI  --  0.06* 0.05* 0.05*  --   --     Estimated Creatinine Clearance: 25.7 mL/min (by C-G formula based on Cr of 2.41).    Scheduled:  . allopurinol  100 mg Oral Daily  . amiodarone  200 mg Oral Daily  . aspirin EC  81 mg Oral Daily  . atorvastatin  40 mg Oral q1800  . carvedilol  6.25 mg Oral BID WC  . colchicine  0.6 mg Oral Daily  . famotidine  20 mg Oral BID  . ferrous sulfate  325 mg Oral BID WC  . hydrALAZINE  25 mg Oral 3 times per day  . insulin aspart  0-9 Units Subcutaneous TID WC  . isosorbide dinitrate  20 mg Oral TID  . [START ON 06/26/2015] levothyroxine  100 mcg Oral QAC breakfast  . pantoprazole  40 mg Oral Daily  . PARoxetine  10 mg Oral Daily  . phytonadione  10 mg Subcutaneous Once  . potassium chloride SA  20 mEq Oral Once per day on Mon Wed Fri  . QUEtiapine  25 mg Oral QHS  . sodium chloride flush  3 mL Intravenous Q12H     Assessment: 65 yo female here with CP and with history of CAD with PCI.  She is on coumadin PTA for afib (CHADSVASC= 7/9). Pharmacy consulted to dose coumadin. Noted plans for [facility] when INR < 1.8 -INR= 3.64, hg= 9.2, plt= 180  Goal of Therapy:  INR 2-3 Monitor platelets by anticoagulation protocol: Yes   Plan:  -Hold coumadin -Daily PT/INR -Will follow plans  Hildred Laser, Pharm D 06/28/2015 1:15 PM

## 2015-06-28 NOTE — Progress Notes (Signed)
Patient Name: DENIESE AUBRY Date of Encounter: 06/28/2015     Principal Problem:   Chest pain Active Problems:   HTN (hypertension)   Cardiomyopathy, ischemic-EF 35-40%   CAD S/P prior RCA PCI, cath 05/03/2013- med Rx, STEMI 01/2015 - 100% prox PDA, treated medically   DM2 with nephropathy   Persistent atrial fibrillation (HCC)   Chronic systolic CHF (congestive heart failure) ([place])   Chest pain at rest   Controlled diabetes mellitus type 2 with complications [facility])   Hypomagnesemia   Primary Cardiologist: Dr Johnsie Cancel, Dr Rayann Heman  SUBJECTIVE   No more CP. More SOB, mild orthopnea and PND.  CURRENT MEDS . allopurinol  100 mg Oral Daily  . amiodarone  200 mg Oral Daily  . aspirin EC  81 mg Oral Daily  . atorvastatin  40 mg Oral q1800  . carvedilol  6.25 mg Oral BID WC  . colchicine  0.6 mg Oral Daily  . famotidine  20 mg Oral BID  . ferrous sulfate  325 mg Oral BID WC  . hydrALAZINE  25 mg Oral 3 times per day  . insulin aspart  0-9 Units Subcutaneous TID WC  . isosorbide dinitrate  20 mg Oral TID  . [START ON 07/04/2015] levothyroxine  100 mcg Oral QAC breakfast  . pantoprazole  40 mg Oral Daily  . PARoxetine  10 mg Oral Daily  . potassium chloride SA  20 mEq Oral Once per day on Mon Wed Fri  . QUEtiapine  25 mg Oral QHS  . sodium chloride flush  3 mL Intravenous Q12H  . torsemide  20 mg Oral Once per day on Mon Wed Fri    OBJECTIVE  Filed Vitals:   06/27/15 2046 06/28/15 0446 06/28/15 0500 06/28/15 0607  BP: 128/77 140/90  130/94  Pulse: 73 71    Temp: 98 F (36.7 C) 97.5 F (36.4 C)    TempSrc: Oral Oral    Resp: 18 18    Height:      Weight:   187 lb 11.2 oz (85.14 kg)   SpO2: 100% 100%      Intake/Output Summary (Last 24 hours) at 06/28/15 1049 Last data filed at 06/27/15 1553  Gross per 24 hour  Intake    670 ml  Output      0 ml  Net    670 ml   Filed Weights   06/27/15 0335 06/28/15 0500  Weight: 182 lb 9.6 oz (82.827 kg) 187 lb 11.2 oz (85.14 kg)     PHYSICAL EXAM  General: Pleasant, NAD. Neuro: Alert and oriented X 3. Moves all extremities spontaneously. Psych: Normal affect. HEENT:  Normal  Neck: Supple without bruits or JVD. Lungs:  Resp regular and unlabored, decreased breath sounds at bases. Heart: RRR no s3, s4, or Loud systolic murmur heard best at apex. Abdomen: Soft, non-tender, non-distended, BS + x 4.  Extremities: No clubbing, cyanosis or trace LE edema. DP/PT/Radials 2+ and equal bilaterally.  Accessory Clinical Findings  CBC  Recent Labs  06/27/15 0546 06/28/15 0418  WBC 4.7 3.8*  NEUTROABS 3.3 2.6  HGB 8.1* 9.2*  HCT 26.3* 28.0*  MCV 92.3 92.4  PLT 204 [id number]   Basic Metabolic Panel  Recent Labs  06/27/15 0546 06/27/15 0806 06/28/15 0418  NA 138  --  141  K 2.7*  --  3.8  CL 99*  --  103  CO2 27  --  28  GLUCOSE 89  --  123*  BUN  16  --  18  CREATININE 2.04*  --  2.41*  CALCIUM 8.7*  --  9.0  MG  --  1.6* 1.9   Liver Function Tests  Recent Labs  06/27/15 0546 06/28/15 0418  AST 36 41  ALT 25 28  ALKPHOS 59 61  BILITOT 0.9 0.8  PROT 6.1* 6.4*  ALBUMIN 2.7* 2.6*    Recent Labs  06/27/15 0546  LIPASE 36   Cardiac Enzymes  Recent Labs  06/27/15 0546 06/27/15 1025 06/27/15 1650  TROPONINI 0.06* 0.05* 0.05*   BNP Invalid input(s): POCBNP D-Dimer  Recent Labs  07/06/2015 0218  DDIMER 1.32*   Hemoglobin A1C No results for input(s): HGBA1C in the last 72 hours. Fasting Lipid Panel  Recent Labs  06/28/15 0418  CHOL 141  HDL 46  LDLCALC 80  TRIG 74  CHOLHDL 3.1   Thyroid Function Tests  Recent Labs  06/27/15 0546  TSH 5.007*    TELE  NSR  Radiology/Studies  Dg Chest 2 View  07/08/2015  CLINICAL DATA:  Chest pain, shortness of breath and cough. EXAM: CHEST - 2 VIEW COMPARISON:  04/13/2015 FINDINGS: Stable moderate cardiac enlargement appearance of a dual-chamber pacing/ICD device. There is no evidence of pulmonary edema, consolidation, pneumothorax,  nodule or pleural fluid. IMPRESSION: Stable cardiac enlargement and appearance of pacer/ICD. No acute edema identified. Electronically Signed   By: Aletta Edouard M.D.   On: 06/25/2015 18:42   Nm Pulmonary Perf And Vent  06/27/2015  CLINICAL DATA:  Shortness of breath, chest pain since yesterday EXAM: NUCLEAR MEDICINE VENTILATION - PERFUSION LUNG SCAN TECHNIQUE: Ventilation images were obtained in multiple projections using inhaled aerosol Tc-18m DTPA. Perfusion images were obtained in multiple projections after intravenous injection of Tc-44m MAA. RADIOPHARMACEUTICALS:  31.2 mCi Technetium-5m DTPA aerosol inhalation and 4.2 mCi Technetium-65m MAA IV COMPARISON:  VQ scan 04/14/2015.  Chest x-ray 07/01/2015. FINDINGS: Ventilation: No focal ventilation defect. Perfusion: No wedge shaped peripheral perfusion defects to suggest acute pulmonary embolism. IMPRESSION: No evidence of pulmonary embolus. Electronically Signed   By: Rolm Baptise M.D.   On: 06/27/2015 15:56    2D ECHO: 06/27/2015 LV EF: 20% - 25% Study Conclusions - Left ventricle: The cavity size was severely dilated. Wall  thickness was increased in a pattern of mild LVH. Systolic  function was severely reduced. The estimated ejection fraction  was in the range of 20% to 25%. Diffuse hypokinesis. There is  akinesis of the inferolateral and inferior myocardium. Features  are consistent with a pseudonormal left ventricular filling  pattern, with concomitant abnormal relaxation and increased  filling pressure (grade 2 diastolic dysfunction). Doppler  parameters are consistent with high ventricular filling pressure. - Mitral valve: There was severe regurgitation. - Left atrium: The atrium was severely dilated. - Right ventricle: The cavity size was mildly dilated. Systolic  function was mildly reduced. - Right atrium: The atrium was mildly dilated. - Atrial septum: There was an atrial septal aneurysm. - Tricuspid valve: There  was severe regurgitation. - Pulmonary arteries: Systolic pressure was severely increased. PA  peak pressure: 90 mm Hg (S). - Pericardium, extracardiac: A trivial pericardial effusion was  identified. Impressions: - Global hypokinesis with akinesis of the inferior and inferior  lateral wall; severe LVE; grade 2 diastolic dysfunction with  elevated LV filling pressure; severe LAE; mild RAE/RVE; mildly  reduced RV function; severe MR; severe TR; severely elevated  pulmonary pressure.    ASSESSMENT AND PLAN This is a 65 year old female patient with h/o of  CAD, HTN, ICM s/p MDT dual chamber ICD 2013, lymphoma s/p chemo/radiation [id number], chronic systolic CHF, status post VT ablation 2, HTN, HLD, VT, DM, CKD stage III, PMR and depression who presented to [facility] on 06/27/15 with CP or SOB.  Chest pain with history of CAD status post ST elevation MI in November 2016 - patient's chest pain improved with nitroglycerin. D-dimer is elevated will get VQ scan. Troponin 0.06 -> 0.05 and ECG is unchanged from prior. with Crea 2.0, no cath indicated. V/Q scan negative for PE. With new findings of worsening EF, severe MR and pulm HTN we will need L/RHC. No LV gram due CKD. Her INR 3.64. Will have to wait for her INR  Continue  -- Coreg and statin. INR 3.5, no heparin needed. -- Keep Hb > 9.   Chronic combine S/D CHF / severe MR / severe Pulm HTN: last EF measured in November 2016 was 35-40% s/p AICD placement  -- Repeat 2D ECHO this admission with worsening EF to 20-25% w/ diffuse HK and akinesis of the inferolateral and inferior myocardium, G2DD, severe MR, mild RV dysfunction/dilation, + atrial septal aneurysm, PA pressure 60mm HG.   -- Didn't get torsemide yesterday and has some SOB, mild orthopnea and PND. Restarted on home Torsemide today  Paroxysmal atrial fibrillation: chads 2 vasc score is 4 and patient is on Coumadin. Continue rate limiting medications. Presently in sinus rhythm. INR currently  supratheraputic. Holding coumadin. Will need L/RHC when INR drops below at least 1.8  Chronic kidney disease stage IV: creat 2.41. Closely follow metabolic panel.  Chronic anemia: history of GI bleed and workup in 03/2014 was unremarkable for GI bleed. Workup included EGD and colonoscopy. Stool for occult blood negative yesterday. She was given 1 unit of blood yesterday and feeling better. Needs to have Hb > 9.   Diabetes mellitus type 2: on sliding scale coverage. Last hemoglobin A1c recorded in the chart is 5.8.  HLD: cont statin.  Hypothyroidism on Synthroid.  Hypokalemia:  being replaced.   Judy Pimple PA-C  Pager [phone]  Patient examined chart reviewed. Discussed care with twin sister and brother. Spent over 45 minutes discussing complex issues.  Have reviewed her multiple Previous hospitalizations, cath and echo's.  Admitted with recurrent SSCP and  Has had multiple episodes of pulmonary edema.  Echo with severe ischemic MR and severe decrease in LV function. She will not do well with medical Rx And is only 64.  However her LV dysfunction may preclude MV repair/replacement Comorbidities include CRF  Discussed aggressive approach vs continued medical Rx and poor prognosis. They wish to do studies that would be necessary to have surgical evaluation Will try to arrange TEE for tomorrow.  Then right and left cath ? Friday.  Will need to hydrate for Cr And give vit K to see if INR can come down enough to do cath Friday. She understands risk of intubation And renal failure with cath.  Also discussed advanced directives and she doe not want to be DNR Has functioning AICD and ok with intubation.  My concern is not being able to fix MV due to LV dysfunction  Lab called orders written for TEE and cath  [facility]

## 2015-06-29 ENCOUNTER — Encounter (HOSPITAL_COMMUNITY): Admission: EM | Disposition: E | Payer: Self-pay | Source: Home / Self Care | Attending: Pulmonary Disease

## 2015-06-29 ENCOUNTER — Observation Stay (HOSPITAL_COMMUNITY): Payer: Medicare Other

## 2015-06-29 DIAGNOSIS — I255 Ischemic cardiomyopathy: Secondary | ICD-10-CM | POA: Diagnosis not present

## 2015-06-29 DIAGNOSIS — I209 Angina pectoris, unspecified: Secondary | ICD-10-CM | POA: Diagnosis not present

## 2015-06-29 DIAGNOSIS — R079 Chest pain, unspecified: Secondary | ICD-10-CM | POA: Diagnosis not present

## 2015-06-29 DIAGNOSIS — I340 Nonrheumatic mitral (valve) insufficiency: Secondary | ICD-10-CM

## 2015-06-29 DIAGNOSIS — I2510 Atherosclerotic heart disease of native coronary artery without angina pectoris: Secondary | ICD-10-CM | POA: Diagnosis not present

## 2015-06-29 DIAGNOSIS — I481 Persistent atrial fibrillation: Secondary | ICD-10-CM | POA: Diagnosis not present

## 2015-06-29 DIAGNOSIS — Z9861 Coronary angioplasty status: Secondary | ICD-10-CM | POA: Diagnosis not present

## 2015-06-29 HISTORY — PX: TEE WITHOUT CARDIOVERSION: SHX5443

## 2015-06-29 LAB — CBC
HEMATOCRIT: 27.7 % — AB (ref 36.0–46.0)
Hemoglobin: 9.2 g/dL — ABNORMAL LOW (ref 12.0–15.0)
MCH: 30.7 pg (ref 26.0–34.0)
MCHC: 33.2 g/dL (ref 30.0–36.0)
MCV: 92.3 fL (ref 78.0–100.0)
Platelets: 164 10*3/uL (ref 150–400)
RBC: 3 MIL/uL — ABNORMAL LOW (ref 3.87–5.11)
RDW: 18.1 % — AB (ref 11.5–15.5)
WBC: 4.3 10*3/uL (ref 4.0–10.5)

## 2015-06-29 LAB — BASIC METABOLIC PANEL
Anion gap: 11 (ref 5–15)
BUN: 18 mg/dL (ref 6–20)
CALCIUM: 8.7 mg/dL — AB (ref 8.9–10.3)
CO2: 26 mmol/L (ref 22–32)
CREATININE: 2.27 mg/dL — AB (ref 0.44–1.00)
Chloride: 102 mmol/L (ref 101–111)
GFR calc non Af Amer: 22 mL/min — ABNORMAL LOW (ref >60)
GFR, EST AFRICAN AMERICAN: 25 mL/min — AB (ref >60)
Glucose, Bld: 70 mg/dL (ref 65–99)
Potassium: 3.3 mmol/L — ABNORMAL LOW (ref 3.5–5.1)
Sodium: 139 mmol/L (ref 135–145)

## 2015-06-29 LAB — PROTIME-INR
INR: 3.26 — AB (ref 0.00–1.49)
PROTHROMBIN TIME: 32.6 s — AB (ref 11.6–15.2)

## 2015-06-29 LAB — GLUCOSE, CAPILLARY
GLUCOSE-CAPILLARY: 66 mg/dL (ref 65–99)
GLUCOSE-CAPILLARY: 58 mg/dL — AB (ref 65–99)
Glucose-Capillary: 156 mg/dL — ABNORMAL HIGH (ref 65–99)
Glucose-Capillary: 119 mg/dL — ABNORMAL HIGH (ref 65–99)
Glucose-Capillary: 130 mg/dL — ABNORMAL HIGH (ref 65–99)
Glucose-Capillary: 129 mg/dL — ABNORMAL HIGH (ref 65–99)

## 2015-06-29 LAB — HEMOGLOBIN A1C
Hgb A1c MFr Bld: 6.1 % — ABNORMAL HIGH (ref 4.8–5.6)
Mean Plasma Glucose: 128 mg/dL

## 2015-06-29 SURGERY — RIGHT/LEFT HEART CATH AND CORONARY ANGIOGRAPHY

## 2015-06-29 SURGERY — ECHOCARDIOGRAM, TRANSESOPHAGEAL
Anesthesia: Moderate Sedation

## 2015-06-29 MED ORDER — DIPHENHYDRAMINE HCL 50 MG/ML IJ SOLN
25.0000 mg | Freq: Once | INTRAMUSCULAR | Status: DC
  Filled 2015-06-29: qty 1

## 2015-06-29 MED ORDER — COLCHICINE 0.6 MG PO TABS
0.3000 mg | ORAL_TABLET | Freq: Every day | ORAL | Status: DC
  Administered 2015-07-01 – 2015-07-16 (×16): 0.3 mg via ORAL
  Filled 2015-06-29 (×16): qty 1

## 2015-06-29 MED ORDER — MIDAZOLAM HCL 10 MG/2ML IJ SOLN
INTRAMUSCULAR | Status: DC | PRN
  Administered 2015-06-29 (×3): 1 mg via INTRAVENOUS

## 2015-06-29 MED ORDER — LIDOCAINE VISCOUS 2 % MT SOLN
OROMUCOSAL | Status: AC
Start: 2015-06-29 — End: 2015-06-29
  Filled 2015-06-29: qty 15

## 2015-06-29 MED ORDER — SODIUM CHLORIDE 0.9 % IV SOLN
INTRAVENOUS | Status: DC
  Administered 2015-06-29: via INTRAVENOUS

## 2015-06-29 MED ORDER — FAMOTIDINE 20 MG PO TABS
20.0000 mg | ORAL_TABLET | Freq: Once | ORAL | Status: AC
  Administered 2015-06-30: 20 mg via ORAL
  Filled 2015-06-29: qty 1

## 2015-06-29 MED ORDER — FENTANYL CITRATE (PF) 100 MCG/2ML IJ SOLN
INTRAMUSCULAR | Status: AC
  Filled 2015-06-29: qty 2

## 2015-06-29 MED ORDER — SODIUM CHLORIDE 0.9% FLUSH: 3.0000 mL | INTRAVENOUS | Status: DC | PRN

## 2015-06-29 MED ORDER — PREDNISONE 50 MG PO TABS
60.0000 mg | ORAL_TABLET | Freq: Every day | ORAL | Status: DC
  Administered 2015-06-29: 60 mg via ORAL
  Filled 2015-06-29: qty 3

## 2015-06-29 MED ORDER — DEXTROSE 50 % IV SOLN
INTRAVENOUS | Status: AC
  Administered 2015-06-29: 25 mL
  Filled 2015-06-29: qty 50

## 2015-06-29 MED ORDER — PREDNISONE 20 MG PO TABS
60.0000 mg | ORAL_TABLET | Freq: Once | ORAL | Status: AC
  Administered 2015-06-30: 60 mg via ORAL
  Filled 2015-06-29: qty 3

## 2015-06-29 MED ORDER — POTASSIUM CHLORIDE CRYS ER 20 MEQ PO TBCR
40.0000 meq | EXTENDED_RELEASE_TABLET | Freq: Once | ORAL | Status: AC
  Administered 2015-06-29: 40 meq via ORAL
  Filled 2015-06-29: qty 2

## 2015-06-29 MED ORDER — BUTAMBEN-TETRACAINE-BENZOCAINE 2-2-14 % EX AERO
INHALATION_SPRAY | CUTANEOUS | Status: DC | PRN
  Administered 2015-06-29: 2 via TOPICAL

## 2015-06-29 MED ORDER — SODIUM CHLORIDE 0.9% FLUSH: 3.0000 mL | Freq: Two times a day (BID) | INTRAVENOUS | Status: DC

## 2015-06-29 MED ORDER — SODIUM CHLORIDE 0.9 % IV SOLN
250.0000 mL | INTRAVENOUS | Status: DC | PRN
Start: 2015-06-29 — End: 2015-06-30

## 2015-06-29 MED ORDER — VITAMIN K1 10 MG/ML IJ SOLN
5.0000 mg | Freq: Once | INTRAVENOUS | Status: AC
  Administered 2015-06-29: 5 mg via INTRAVENOUS
  Filled 2015-06-29: qty 0.5

## 2015-06-29 MED ORDER — MIDAZOLAM HCL 5 MG/ML IJ SOLN
INTRAMUSCULAR | Status: AC
  Filled 2015-06-29: qty 2

## 2015-06-29 MED ORDER — ASPIRIN 81 MG PO CHEW
81.0000 mg | CHEWABLE_TABLET | ORAL | Status: AC
  Administered 2015-06-30: 81 mg via ORAL
  Filled 2015-06-29: qty 1

## 2015-06-29 MED ORDER — FENTANYL CITRATE (PF) 100 MCG/2ML IJ SOLN
INTRAMUSCULAR | Status: DC | PRN
  Administered 2015-06-29: 25 ug via INTRAVENOUS

## 2015-06-29 NOTE — Progress Notes (Addendum)
[facility] PROGRESS NOTE  [name] Terry [id number] DOB: [birth year] DOA: 07/04/2015 PCP: Pcp Not In System Admit HPI / Brief Narrative: [name] Terry is a 65 y.o. BF PMHx Anxiety, CAD native artery, S/P stenting, Chronic Systolic CHF S/P ICD placement, proximal respiratory ablation, DM Type 2 with complications  Chronic anemia, CKD Stage IV   Presents to the ER because of chest pain. Patient has been having chest pain since yesterday morning. Pain is mostly on the left side of the chest radiating to her lower back. Had one episode of nausea vomiting and has been exertional shortness of breath. Pain persisted throughout the day. EKG was showing nonspecific changes and chest x-ray was unremarkable. Troponins are negative. Patient's chest pain improved with sublingual nitroglycerin. Patient's d-dimer is elevated. Since patient has chronic kidney disease will need VQ scan and cannot get CT scan to rule out PE. Patient states she was switched from Eliquis to Coumadin while she was in a nursing home. Stool for occult blood has been negative.   HPI/Subjective: Feels weak, ok otherwise, breathing ok  Assessment/Plan: Chest pain/CAD status post stenting in 2015 -Troponin minimally elevated, but ECHO with inferior and inferolateral WMA, EF down to 20-25% from 35-40% in Nov -had inferior STEMI in Nov,- cardiac cath then showed 100% stenosis of prox PDA, patent stent in RCA with otherwise normal coronary arteries -VQ scan : negative for PE -per Cards, plan for [facility] tomorrow -continue ASA/coreg/statin  Acute on Chronic Systolic CHF  - EF now down to 20-25% with new WMA - status post AICD placement - - continue Coreg, Hydralazine, Isosorbide dinitrate 20 mg TID -Torsemide. 20 mg M/W/F, required Iv lasix x1 4/5 -4/4 transfused 1 unit PRBC  Paroxysmal atrial fibrillation chads 2 vasc score is 4  -continue coreg, amiodarone -Hold Coumadin for LHC, INR 3.5, give vitamin K x1    Chronic kidney  disease stage IV -baseline 2.8  -stable, monitor, will get contrast for [facility] tomorrow  Chronic anemia  -likely due to CKD  -workup in January 2016 was unremarkable for signs of GI bleed. Workup included EGD and colonoscopy. Stool for occult blood has been negative  -s/p 1 unit PRBC 4/4  Diabetes mellitus type 2 controlled with complications - Last hemoglobin A1c recorded in the chart is 5.8. -SSI  Hyperlipidemia - Lipitor 40 mg daily  Hypothyroidism  -Levothyroxine 75 g daily  -TSH elevated, will increase dose to 187mcg  Hypokalemia -replaced  Hypomagnesemia -replaced  Cdiff colonization -no more diarrhea, Cdiff Ag positive and toxin negative -no need to Rx  DVT proph: on warfarin  Code Status: Full Family Communication: None  Disposition Plan: back to SNF when stable  Consultants: [place] [facility]   Procedures:  Cultures NA   Antibiotics: NA   DVT prophylaxis COumadin   Objective: Filed Vitals:   07/22/2015 1020 07/03/2015 1030 07/22/2015 1040 06/28/2015 1140  BP: 124/73 130/77 142/85 137/90  Pulse: 76 77 79 83  Temp:    97.5 F (36.4 C)  TempSrc:    Oral  Resp: 21 17 30 18   Height:      Weight:      SpO2: 100% 100% 100% 100%    Intake/Output Summary (Last 24 hours) at 07/13/2015 1411 Last data filed at 07/20/2015 1155  Gross per 24 hour  Intake    240 ml  Output      0 ml  Net    240 ml   Filed Weights   06/27/15 0335 06/28/15  0500 06/28/2015 0500  Weight: 82.827 kg (182 lb 9.6 oz) 85.14 kg (187 lb 11.2 oz) 85.73 kg (189 lb)     Exam: General: A/O 4, positive no distress Eyes: Negative headache, double vision,negative scleral hemorrhage ENT: Negative Runny nose, negative gingival bleeding, Neck:  Negative scars, masses, torticollis, lymphadenopathy, JVD Lungs: Clear to auscultation bilaterally without wheezes or crackles Cardiovascular: Regular rate and rhythm without murmur gallop or rub normal S1 and S2 Abdomen:negative  abdominal pain, negative dysphagia, nondistended, positive soft, bowel sounds, no rebound, no ascites, no appreciable mass, left chest wall AICD present Extremities: No significant cyanosis, clubbing, or edema bilateral lower extremities Psychiatric:  Negative depression, positive anxiety, negative fatigue, negative mania  Neurologic:  Cranial nerves II through XII intact, tongue/uvula midline, all extremities muscle strength 5/5, sensation intact throughout, negative dysarthria, negative expressive aphasia, negative receptive aphasia.     Data Reviewed: Basic Metabolic Panel:  Recent Labs Lab 07/23/2015 1832 06/27/15 0546 06/27/15 0806 06/28/15 0418 07/01/2015 0215  NA 140 138  --  141 139  K 3.2* 2.7*  --  3.8 3.3*  CL 100* 99*  --  103 102  CO2 27 27  --  28 26  GLUCOSE 132* 89  --  123* 70  BUN 16 16  --  18 18  CREATININE 2.08* 2.04*  --  2.41* 2.27*  CALCIUM 9.2 8.7*  --  9.0 8.7*  MG  --   --  1.6* 1.9  --    Liver Function Tests:  Recent Labs Lab 06/27/15 0546 06/28/15 0418  AST 36 41  ALT 25 28  ALKPHOS 59 61  BILITOT 0.9 0.8  PROT 6.1* 6.4*  ALBUMIN 2.7* 2.6*    Recent Labs Lab 06/27/15 0546  LIPASE 36   No results for input(s): AMMONIA in the last 168 hours. CBC:  Recent Labs Lab 07/13/2015 1832 06/27/15 0546 06/28/15 0418 07/01/2015 0215  WBC 5.8 4.7 3.8* 4.3  NEUTROABS  --  3.3 2.6  --   HGB 8.9* 8.1* 9.2* 9.2*  HCT 29.0* 26.3* 28.0* 27.7*  MCV 92.4 92.3 92.4 92.3  PLT 231 204 180 164   Cardiac Enzymes:  Recent Labs Lab 06/27/15 0546 06/27/15 1025 06/27/15 1650  TROPONINI 0.06* 0.05* 0.05*   BNP (last 3 results)  Recent Labs  02/21/15 1042 03/01/15 0839 04/13/15 2215  BNP 386.1* 452.0* 894.0*    ProBNP (last 3 results) No results for input(s): PROBNP in the last 8760 hours.  CBG:  Recent Labs Lab 06/28/15 2141 07/09/2015 0600 07/10/2015 0615 07/07/2015 1122 07/15/2015 1348  GLUCAP 91 66 156* 58* 119*    Recent Results (from  the past 240 hour(s))  MRSA PCR Screening     Status: None   Collection Time: 06/27/15  3:54 AM  Result Value Ref Range Status   MRSA by PCR NEGATIVE NEGATIVE Final    Comment:        The GeneXpert MRSA Assay (FDA approved for NASAL specimens only), is one component of a comprehensive MRSA colonization surveillance program. It is not intended to diagnose MRSA infection nor to guide or monitor treatment for MRSA infections.   C difficile quick scan w PCR reflex     Status: Abnormal   Collection Time: 06/28/15  6:52 PM  Result Value Ref Range Status   C Diff antigen POSITIVE (A) NEGATIVE Final   C Diff toxin NEGATIVE NEGATIVE Final   C Diff interpretation   Final    C. difficile present, but  toxin not detected. This indicates colonization. In most cases, this does not require treatment. If patient has signs and symptoms consistent with colitis, consider treatment. Requires ENTERIC precautions.     Studies: Dg Chest 2 View  06/28/2015  CLINICAL DATA:  eval for issues with dyspnea - pt seemed confused, unable to give a good history - hx of lymphoma, vascular disease, CHF, htn, diabetes, stroke, CAD, PAF, mitral regurgitation EXAM: CHEST  2 VIEW COMPARISON:  06/27/2015 FINDINGS: There is central vascular congestion and interstitial thickening. No focal lung consolidation. No pleural effusion or pneumothorax. Elevation the right hemidiaphragm is stable. Cardiac silhouette is mildly enlarged. No mediastinal or hilar masses or evidence of adenopathy. Left anterior chest wall AICD is stable and well positioned. Bony thorax is demineralized but intact. IMPRESSION: 1. Mild congestive heart failure suspected reflected by central vascular congestion and interstitial thickening with mild cardiomegaly. No evidence of pneumonia. Electronically Signed   By: Lajean Manes M.D.   On: 06/28/2015 15:10   Nm Pulmonary Perf And Vent  06/27/2015  CLINICAL DATA:  Shortness of breath, chest pain since yesterday  EXAM: NUCLEAR MEDICINE VENTILATION - PERFUSION LUNG SCAN TECHNIQUE: Ventilation images were obtained in multiple projections using inhaled aerosol Tc-58m DTPA. Perfusion images were obtained in multiple projections after intravenous injection of Tc-78m MAA. RADIOPHARMACEUTICALS:  31.2 mCi Technetium-47m DTPA aerosol inhalation and 4.2 mCi Technetium-16m MAA IV COMPARISON:  VQ scan 04/14/2015.  Chest x-ray 07/13/2015. FINDINGS: Ventilation: No focal ventilation defect. Perfusion: No wedge shaped peripheral perfusion defects to suggest acute pulmonary embolism. IMPRESSION: No evidence of pulmonary embolus. Electronically Signed   By: Rolm Baptise M.D.   On: 06/27/2015 15:56    Scheduled Meds: . allopurinol  100 mg Oral Daily  . amiodarone  200 mg Oral Daily  . aspirin EC  81 mg Oral Daily  . atorvastatin  40 mg Oral q1800  . carvedilol  6.25 mg Oral BID WC  . [START ON 06/30/2015] colchicine  0.3 mg Oral Daily  . famotidine  20 mg Oral Daily  . ferrous sulfate  325 mg Oral BID WC  . hydrALAZINE  25 mg Oral 3 times per day  . insulin aspart  0-9 Units Subcutaneous TID WC  . isosorbide dinitrate  20 mg Oral TID  . levothyroxine  100 mcg Oral QAC breakfast  . pantoprazole  40 mg Oral Daily  . PARoxetine  10 mg Oral Daily  . potassium chloride SA  20 mEq Oral Once per day on Mon Wed Fri  . QUEtiapine  25 mg Oral QHS  . sodium chloride flush  3 mL Intravenous Q12H   Continuous Infusions:   Principal Problem:   Chest pain Active Problems:   HTN (hypertension)   Cardiomyopathy, ischemic-EF 35-40%   CAD S/P prior RCA PCI, cath 05/03/2013- med Rx, STEMI 01/2015 - 100% prox PDA, treated medically   DM2 with nephropathy   Mitral regurgitation   Persistent atrial fibrillation (HCC)   Chronic systolic CHF (congestive heart failure) ([place])   Chest pain at rest   Controlled diabetes mellitus type 2 with complications ([place])   Hypomagnesemia    Time spent: 40 minutes    [facility]  [facility] Pager [phone]. If 7PM-7AM, please contact night-coverage at [email or web address], password [facility] 06/30/2015, 2:11 PM  LOS: 2 days    Care during the described time interval was provided by me .  I have reviewed this patient's available data, including medical history, events of note, physical examination, and all  test results as part of my evaluation. I have personally reviewed and interpreted all radiology studies.

## 2015-06-29 NOTE — H&P (View-Only) (Signed)
Patient Name: YORLENI HIBDON Date of Encounter: 06/28/2015     Principal Problem:   Chest pain Active Problems:   HTN (hypertension)   Cardiomyopathy, ischemic-EF 35-40%   CAD S/P prior RCA PCI, cath 05/03/2013- med Rx, STEMI 01/2015 - 100% prox PDA, treated medically   DM2 with nephropathy   Persistent atrial fibrillation (HCC)   Chronic systolic CHF (congestive heart failure) ([place])   Chest pain at rest   Controlled diabetes mellitus type 2 with complications [facility])   Hypomagnesemia   Primary Cardiologist: Dr Johnsie Cancel, Dr Rayann Heman  SUBJECTIVE   No more CP. More SOB, mild orthopnea and PND.  CURRENT MEDS . allopurinol  100 mg Oral Daily  . amiodarone  200 mg Oral Daily  . aspirin EC  81 mg Oral Daily  . atorvastatin  40 mg Oral q1800  . carvedilol  6.25 mg Oral BID WC  . colchicine  0.6 mg Oral Daily  . famotidine  20 mg Oral BID  . ferrous sulfate  325 mg Oral BID WC  . hydrALAZINE  25 mg Oral 3 times per day  . insulin aspart  0-9 Units Subcutaneous TID WC  . isosorbide dinitrate  20 mg Oral TID  . [START ON 07/21/2015] levothyroxine  100 mcg Oral QAC breakfast  . pantoprazole  40 mg Oral Daily  . PARoxetine  10 mg Oral Daily  . potassium chloride SA  20 mEq Oral Once per day on Mon Wed Fri  . QUEtiapine  25 mg Oral QHS  . sodium chloride flush  3 mL Intravenous Q12H  . torsemide  20 mg Oral Once per day on Mon Wed Fri    OBJECTIVE  Filed Vitals:   06/27/15 2046 06/28/15 0446 06/28/15 0500 06/28/15 0607  BP: 128/77 140/90  130/94  Pulse: 73 71    Temp: 98 F (36.7 C) 97.5 F (36.4 C)    TempSrc: Oral Oral    Resp: 18 18    Height:      Weight:   187 lb 11.2 oz (85.14 kg)   SpO2: 100% 100%      Intake/Output Summary (Last 24 hours) at 06/28/15 1049 Last data filed at 06/27/15 1553  Gross per 24 hour  Intake    670 ml  Output      0 ml  Net    670 ml   Filed Weights   06/27/15 0335 06/28/15 0500  Weight: 182 lb 9.6 oz (82.827 kg) 187 lb 11.2 oz (85.14 kg)     PHYSICAL EXAM  General: Pleasant, NAD. Neuro: Alert and oriented X 3. Moves all extremities spontaneously. Psych: Normal affect. HEENT:  Normal  Neck: Supple without bruits or JVD. Lungs:  Resp regular and unlabored, decreased breath sounds at bases. Heart: RRR no s3, s4, or Loud systolic murmur heard best at apex. Abdomen: Soft, non-tender, non-distended, BS + x 4.  Extremities: No clubbing, cyanosis or trace LE edema. DP/PT/Radials 2+ and equal bilaterally.  Accessory Clinical Findings  CBC  Recent Labs  06/27/15 0546 06/28/15 0418  WBC 4.7 3.8*  NEUTROABS 3.3 2.6  HGB 8.1* 9.2*  HCT 26.3* 28.0*  MCV 92.3 92.4  PLT 204 [id number]   Basic Metabolic Panel  Recent Labs  06/27/15 0546 06/27/15 0806 06/28/15 0418  NA 138  --  141  K 2.7*  --  3.8  CL 99*  --  103  CO2 27  --  28  GLUCOSE 89  --  123*  BUN  16  --  18  CREATININE 2.04*  --  2.41*  CALCIUM 8.7*  --  9.0  MG  --  1.6* 1.9   Liver Function Tests  Recent Labs  06/27/15 0546 06/28/15 0418  AST 36 41  ALT 25 28  ALKPHOS 59 61  BILITOT 0.9 0.8  PROT 6.1* 6.4*  ALBUMIN 2.7* 2.6*    Recent Labs  06/27/15 0546  LIPASE 36   Cardiac Enzymes  Recent Labs  06/27/15 0546 06/27/15 1025 06/27/15 1650  TROPONINI 0.06* 0.05* 0.05*   BNP Invalid input(s): POCBNP D-Dimer  Recent Labs  07/16/2015 0218  DDIMER 1.32*   Hemoglobin A1C No results for input(s): HGBA1C in the last 72 hours. Fasting Lipid Panel  Recent Labs  06/28/15 0418  CHOL 141  HDL 46  LDLCALC 80  TRIG 74  CHOLHDL 3.1   Thyroid Function Tests  Recent Labs  06/27/15 0546  TSH 5.007*    TELE  NSR  Radiology/Studies  Dg Chest 2 View  07/01/2015  CLINICAL DATA:  Chest pain, shortness of breath and cough. EXAM: CHEST - 2 VIEW COMPARISON:  04/13/2015 FINDINGS: Stable moderate cardiac enlargement appearance of a dual-chamber pacing/ICD device. There is no evidence of pulmonary edema, consolidation, pneumothorax,  nodule or pleural fluid. IMPRESSION: Stable cardiac enlargement and appearance of pacer/ICD. No acute edema identified. Electronically Signed   By: Aletta Edouard M.D.   On: 06/24/2015 18:42   Nm Pulmonary Perf And Vent  06/27/2015  CLINICAL DATA:  Shortness of breath, chest pain since yesterday EXAM: NUCLEAR MEDICINE VENTILATION - PERFUSION LUNG SCAN TECHNIQUE: Ventilation images were obtained in multiple projections using inhaled aerosol Tc-63m DTPA. Perfusion images were obtained in multiple projections after intravenous injection of Tc-20m MAA. RADIOPHARMACEUTICALS:  31.2 mCi Technetium-72m DTPA aerosol inhalation and 4.2 mCi Technetium-21m MAA IV COMPARISON:  VQ scan 04/14/2015.  Chest x-ray 07/02/2015. FINDINGS: Ventilation: No focal ventilation defect. Perfusion: No wedge shaped peripheral perfusion defects to suggest acute pulmonary embolism. IMPRESSION: No evidence of pulmonary embolus. Electronically Signed   By: Rolm Baptise M.D.   On: 06/27/2015 15:56    2D ECHO: 06/27/2015 LV EF: 20% - 25% Study Conclusions - Left ventricle: The cavity size was severely dilated. Wall  thickness was increased in a pattern of mild LVH. Systolic  function was severely reduced. The estimated ejection fraction  was in the range of 20% to 25%. Diffuse hypokinesis. There is  akinesis of the inferolateral and inferior myocardium. Features  are consistent with a pseudonormal left ventricular filling  pattern, with concomitant abnormal relaxation and increased  filling pressure (grade 2 diastolic dysfunction). Doppler  parameters are consistent with high ventricular filling pressure. - Mitral valve: There was severe regurgitation. - Left atrium: The atrium was severely dilated. - Right ventricle: The cavity size was mildly dilated. Systolic  function was mildly reduced. - Right atrium: The atrium was mildly dilated. - Atrial septum: There was an atrial septal aneurysm. - Tricuspid valve: There  was severe regurgitation. - Pulmonary arteries: Systolic pressure was severely increased. PA  peak pressure: 90 mm Hg (S). - Pericardium, extracardiac: A trivial pericardial effusion was  identified. Impressions: - Global hypokinesis with akinesis of the inferior and inferior  lateral wall; severe LVE; grade 2 diastolic dysfunction with  elevated LV filling pressure; severe LAE; mild RAE/RVE; mildly  reduced RV function; severe MR; severe TR; severely elevated  pulmonary pressure.    ASSESSMENT AND PLAN This is a 65 year old female patient with h/o of  CAD, HTN, ICM s/p MDT dual chamber ICD 2013, lymphoma s/p chemo/radiation [id number], chronic systolic CHF, status post VT ablation 2, HTN, HLD, VT, DM, CKD stage III, PMR and depression who presented to [facility] on 06/27/15 with CP or SOB.  Chest pain with history of CAD status post ST elevation MI in November 2016 - patient's chest pain improved with nitroglycerin. D-dimer is elevated will get VQ scan. Troponin 0.06 -> 0.05 and ECG is unchanged from prior. with Crea 2.0, no cath indicated. V/Q scan negative for PE. With new findings of worsening EF, severe MR and pulm HTN we will need L/RHC. No LV gram due CKD. Her INR 3.64. Will have to wait for her INR  Continue  -- Coreg and statin. INR 3.5, no heparin needed. -- Keep Hb > 9.   Chronic combine S/D CHF / severe MR / severe Pulm HTN: last EF measured in November 2016 was 35-40% s/p AICD placement  -- Repeat 2D ECHO this admission with worsening EF to 20-25% w/ diffuse HK and akinesis of the inferolateral and inferior myocardium, G2DD, severe MR, mild RV dysfunction/dilation, + atrial septal aneurysm, PA pressure 82mm HG.   -- Didn't get torsemide yesterday and has some SOB, mild orthopnea and PND. Restarted on home Torsemide today  Paroxysmal atrial fibrillation: chads 2 vasc score is 4 and patient is on Coumadin. Continue rate limiting medications. Presently in sinus rhythm. INR currently  supratheraputic. Holding coumadin. Will need L/RHC when INR drops below at least 1.8  Chronic kidney disease stage IV: creat 2.41. Closely follow metabolic panel.  Chronic anemia: history of GI bleed and workup in 03/2014 was unremarkable for GI bleed. Workup included EGD and colonoscopy. Stool for occult blood negative yesterday. She was given 1 unit of blood yesterday and feeling better. Needs to have Hb > 9.   Diabetes mellitus type 2: on sliding scale coverage. Last hemoglobin A1c recorded in the chart is 5.8.  HLD: cont statin.  Hypothyroidism on Synthroid.  Hypokalemia:  being replaced.   Judy Pimple PA-C  Pager [phone]  Patient examined chart reviewed. Discussed care with twin sister and brother. Spent over 45 minutes discussing complex issues.  Have reviewed her multiple Previous hospitalizations, cath and echo's.  Admitted with recurrent SSCP and  Has had multiple episodes of pulmonary edema.  Echo with severe ischemic MR and severe decrease in LV function. She will not do well with medical Rx And is only 64.  However her LV dysfunction may preclude MV repair/replacement Comorbidities include CRF  Discussed aggressive approach vs continued medical Rx and poor prognosis. They wish to do studies that would be necessary to have surgical evaluation Will try to arrange TEE for tomorrow.  Then right and left cath ? Friday.  Will need to hydrate for Cr And give vit K to see if INR can come down enough to do cath Friday. She understands risk of intubation And renal failure with cath.  Also discussed advanced directives and she doe not want to be DNR Has functioning AICD and ok with intubation.  My concern is not being able to fix MV due to LV dysfunction  Lab called orders written for TEE and cath  [facility]

## 2015-06-29 NOTE — Progress Notes (Signed)
Patient blood sugar was checked after TEE BS was 58. Patient given lunch and orange juice. Blood sugar rechecked at 1330 it was 119. Patient resting comfortably.   Kenzie Thoreson, Mervin Kung RN

## 2015-06-29 NOTE — Progress Notes (Addendum)
    Noted to have a dye allergy. Will pre medicate with prednisone, Pepcid and benadryl prior to cath at 7:30 am.  60mg  13 hours prior to cath and 60mg  1-2 hours prior to cath. Pepcid and bendrayl on call before they go to cath lab. I have discussed with RN   Angelena Form PA-C  [facility]

## 2015-06-29 NOTE — Progress Notes (Signed)
  Echocardiogram Echocardiogram Transesophageal has been performed.  Bobbye Charleston 07/18/2015, 10:22 AM

## 2015-06-29 NOTE — Progress Notes (Signed)
[place] for coumadin Indication: afib  Allergies  Allergen Reactions  . Flagyl [Metronidazole] Swelling and Rash    Face swells   . Contrast Media [Iodinated Diagnostic Agents] Rash  . Ioxaglate Rash  . Potassium Sulfate Rash and Other (See Comments)    Headaches  . Potassium-Containing Compounds Other (See Comments)    Headaches-- reports no problems now    Patient Measurements: Height: 5\' 5"  (165.1 cm) Weight: 189 lb (85.73 kg) IBW/kg (Calculated) : 57 Heparin Dosing Weight: 74.7kg  Vital Signs: Temp: 98.5 F (36.9 C) (04/06 0545) Temp Source: Oral (04/06 0545) BP: 123/71 mmHg (04/06 0545) Pulse Rate: 90 (04/05 2123)  Labs:  Recent Labs  06/27/15 0546 06/27/15 1025 06/27/15 1650 06/28/15 0418 06/28/15 0914 07/23/2015 0215  HGB 8.1*  --   --  9.2*  --  9.2*  HCT 26.3*  --   --  28.0*  --  27.7*  PLT 204  --   --  180  --  164  LABPROT 34.8*  --   --   --  35.4* 32.6*  INR 3.56*  --   --   --  3.64* 3.26*  CREATININE 2.04*  --   --  2.41*  --  2.27*  TROPONINI 0.06* 0.05* 0.05*  --   --   --     Estimated Creatinine Clearance: 27.4 mL/min (by C-G formula based on Cr of 2.27).    Scheduled:  . allopurinol  100 mg Oral Daily  . amiodarone  200 mg Oral Daily  . aspirin EC  81 mg Oral Daily  . atorvastatin  40 mg Oral q1800  . carvedilol  6.25 mg Oral BID WC  . colchicine  0.6 mg Oral Daily  . famotidine  20 mg Oral Daily  . ferrous sulfate  325 mg Oral BID WC  . hydrALAZINE  25 mg Oral 3 times per day  . insulin aspart  0-9 Units Subcutaneous TID WC  . isosorbide dinitrate  20 mg Oral TID  . levothyroxine  100 mcg Oral QAC breakfast  . pantoprazole  40 mg Oral Daily  . PARoxetine  10 mg Oral Daily  . potassium chloride SA  20 mEq Oral Once per day on Mon Wed Fri  . QUEtiapine  25 mg Oral QHS  . sodium chloride flush  3 mL Intravenous Q12H    Assessment: 65 yo female here with CP and with history of CAD with  PCI.  She is on coumadin PTA for afib (CHADSVASC= 7/9). Pharmacy consulted to dose coumadin. Noted plans for [facility] when INR < 1.8. S/p 10mg  sq vitamin K on 4/5 (sq can have variable absorption).  -INR= 3.26, hg= 9.2, plt= 180  Goal of Therapy:  INR 2-3 Monitor platelets by anticoagulation protocol: Yes   Plan:  -Hold coumadin -Daily PT/INR -Will follow plans  Hildred Laser, Pharm D 07/17/2015 7:44 AM

## 2015-06-29 NOTE — CV Procedure (Signed)
    PROCEDURE NOTE:  Procedure:  Transesophageal echocardiogram Operator:  Fransico Him, MD Indications:  Mitral regurgitation Complications: None  During this procedure the patient is administered a total of Versed 3 mg and Fentanyl 25 mg to achieve and maintain moderate conscious sedation.  The patient's heart rate, blood pressure, and oxygen saturation are monitored continuously during the procedure. The period of conscious sedation is 25 minutes, of which I was present face-to-face 100% of this time.  Results: Severely dilated LV with severe LV dysfunction.  EF 25-30%. Mildly dilated RV with mildly reduced RV function. Severely dilated RA with pacer wire noted in the RA chamber. Severely dilated LA Normal TV with severe TR Normal PV Mildly thickened  MV leaflets with more coaptation due to annular dilatation.  There is severe mitral regurgitation with an eccentric jet directed posteriorly and wraps around the LA and into the pulmonary vein with evidence of flow reversal.    Normal trileaflet AV with trivial AR.   Normal interatrial septum with no evidence of shunt by colorflow dopper  Mild to moderate plaque in the thoracic and ascending aorta.  The patient tolerated the procedure well and was transferred back to their room in stable condition.  Signed: Fransico Him, MD [facility]

## 2015-06-29 NOTE — Care Management Note (Signed)
Pt had blood sugar checked and was very low @ 58. Pt was given 240 ml of orange juice and 120 ml of apple sauce.

## 2015-06-29 NOTE — Progress Notes (Signed)
Patient ID: [name], female   DOB: October 07, 1950, 65 y.o.   MRN: [id number]   Patient Name: [name] Terry Date of Encounter: 06/24/2015     Principal Problem:   Chest pain Active Problems:   HTN (hypertension)   Cardiomyopathy, ischemic-EF 35-40%   CAD S/P prior RCA PCI, cath 05/03/2013- med Rx, STEMI 01/2015 - 100% prox PDA, treated medically   DM2 with nephropathy   Persistent atrial fibrillation (HCC)   Chronic systolic CHF (congestive heart failure) ([place])   Chest pain at rest   Controlled diabetes mellitus type 2 with complications ([place])   Hypomagnesemia   Primary Cardiologist: [place] , Dr Allred  SUBJECTIVE   No more CP. More SOB, mild orthopnea and PND. Slept well   CURRENT MEDS . [MAR Hold] allopurinol  100 mg Oral Daily  . [MAR Hold] amiodarone  200 mg Oral Daily  . [MAR Hold] aspirin EC  81 mg Oral Daily  . [MAR Hold] atorvastatin  40 mg Oral q1800  . [MAR Hold] carvedilol  6.25 mg Oral BID WC  . [MAR Hold] colchicine  0.6 mg Oral Daily  . [MAR Hold] famotidine  20 mg Oral Daily  . [MAR Hold] ferrous sulfate  325 mg Oral BID WC  . [MAR Hold] hydrALAZINE  25 mg Oral 3 times per day  . [MAR Hold] insulin aspart  0-9 Units Subcutaneous TID WC  . [MAR Hold] isosorbide dinitrate  20 mg Oral TID  . [MAR Hold] levothyroxine  100 mcg Oral QAC breakfast  . [MAR Hold] pantoprazole  40 mg Oral Daily  . [MAR Hold] PARoxetine  10 mg Oral Daily  . [MAR Hold] potassium chloride SA  20 mEq Oral Once per day on Mon Wed Fri  . [MAR Hold] QUEtiapine  25 mg Oral QHS  . [facility] Hold] sodium chloride flush  3 mL Intravenous Q12H    OBJECTIVE  Filed Vitals:   06/28/15 1400 06/28/15 2123 07/07/2015 0500 07/17/2015 0545  BP: 143/99 142/87  123/71  Pulse: 88 90    Temp:  98.8 F (37.1 C)  98.5 F (36.9 C)  TempSrc:  Oral  Oral  Resp: 18 18    Height:      Weight:   85.73 kg (189 lb)   SpO2: 100% 100%  99%   No intake or output data in the 24 hours ending 07/23/2015 0825 Filed  Weights   06/27/15 0335 06/28/15 0500 06/26/2015 0500  Weight: 82.827 kg (182 lb 9.6 oz) 85.14 kg (187 lb 11.2 oz) 85.73 kg (189 lb)    PHYSICAL EXAM  General: Pleasant, NAD. Neuro: Alert and oriented X 3. Moves all extremities spontaneously. Psych: Normal affect. HEENT:  Normal  Neck: Supple without bruits or JVD. Lungs:  Resp regular and unlabored, decreased breath sounds at bases. Heart: RRR no s3, s4, or Loud systolic murmur heard best at apex. Abdomen: Soft, non-tender, non-distended, BS + x 4.  Extremities: No clubbing, cyanosis or trace LE edema. DP/PT/Radials 2+ and equal bilaterally.  Accessory Clinical Findings  CBC  Recent Labs  06/27/15 0546 06/28/15 0418 07/23/2015 0215  WBC 4.7 3.8* 4.3  NEUTROABS 3.3 2.6  --   HGB 8.1* 9.2* 9.2*  HCT 26.3* 28.0* 27.7*  MCV 92.3 92.4 92.3  PLT 204 180 [id number]   Basic Metabolic Panel  Recent Labs  06/27/15 0806 06/28/15 0418 07/07/2015 0215  NA  --  141 139  K  --  3.8 3.3*  CL  --  103 102  CO2  --  28 26  GLUCOSE  --  123* 70  BUN  --  18 18  CREATININE  --  2.41* 2.27*  CALCIUM  --  9.0 8.7*  MG 1.6* 1.9  --    Liver Function Tests  Recent Labs  06/27/15 0546 06/28/15 0418  AST 36 41  ALT 25 28  ALKPHOS 59 61  BILITOT 0.9 0.8  PROT 6.1* 6.4*  ALBUMIN 2.7* 2.6*    Recent Labs  06/27/15 0546  LIPASE 36   Cardiac Enzymes  Recent Labs  06/27/15 0546 06/27/15 1025 06/27/15 1650  TROPONINI 0.06* 0.05* 0.05*   BNP Invalid input(s): POCBNP D-Dimer No results for input(s): DDIMER in the last 72 hours. Hemoglobin A1C  Recent Labs  06/28/15 0423  HGBA1C 6.1*   Fasting Lipid Panel  Recent Labs  06/28/15 0418  CHOL 141  HDL 46  LDLCALC 80  TRIG 74  CHOLHDL 3.1   Thyroid Function Tests  Recent Labs  06/27/15 0546  TSH 5.007*    TELE  NSR  Radiology/Studies  Dg Chest 2 View  06/28/2015  CLINICAL DATA:  eval for issues with dyspnea - pt seemed confused, unable to give a good  history - hx of lymphoma, vascular disease, CHF, htn, diabetes, stroke, CAD, PAF, mitral regurgitation EXAM: CHEST  2 VIEW COMPARISON:  07/23/2015 FINDINGS: There is central vascular congestion and interstitial thickening. No focal lung consolidation. No pleural effusion or pneumothorax. Elevation the right hemidiaphragm is stable. Cardiac silhouette is mildly enlarged. No mediastinal or hilar masses or evidence of adenopathy. Left anterior chest wall AICD is stable and well positioned. Bony thorax is demineralized but intact. IMPRESSION: 1. Mild congestive heart failure suspected reflected by central vascular congestion and interstitial thickening with mild cardiomegaly. No evidence of pneumonia. Electronically Signed   By: Lajean Manes M.D.   On: 06/28/2015 15:10   Dg Chest 2 View  06/29/2015  CLINICAL DATA:  Chest pain, shortness of breath and cough. EXAM: CHEST - 2 VIEW COMPARISON:  04/13/2015 FINDINGS: Stable moderate cardiac enlargement appearance of a dual-chamber pacing/ICD device. There is no evidence of pulmonary edema, consolidation, pneumothorax, nodule or pleural fluid. IMPRESSION: Stable cardiac enlargement and appearance of pacer/ICD. No acute edema identified. Electronically Signed   By: Aletta Edouard M.D.   On: 07/06/2015 18:42   Nm Pulmonary Perf And Vent  06/27/2015  CLINICAL DATA:  Shortness of breath, chest pain since yesterday EXAM: NUCLEAR MEDICINE VENTILATION - PERFUSION LUNG SCAN TECHNIQUE: Ventilation images were obtained in multiple projections using inhaled aerosol Tc-98m DTPA. Perfusion images were obtained in multiple projections after intravenous injection of Tc-5m MAA. RADIOPHARMACEUTICALS:  31.2 mCi Technetium-53m DTPA aerosol inhalation and 4.2 mCi Technetium-45m MAA IV COMPARISON:  VQ scan 04/14/2015.  Chest x-ray 07/02/2015. FINDINGS: Ventilation: No focal ventilation defect. Perfusion: No wedge shaped peripheral perfusion defects to suggest acute pulmonary embolism.  IMPRESSION: No evidence of pulmonary embolus. Electronically Signed   By: Rolm Baptise M.D.   On: 06/27/2015 15:56    2D ECHO: 06/27/2015 LV EF: 20% - 25% Study Conclusions - Left ventricle: The cavity size was severely dilated. Wall  thickness was increased in a pattern of mild LVH. Systolic  function was severely reduced. The estimated ejection fraction  was in the range of 20% to 25%. Diffuse hypokinesis. There is  akinesis of the inferolateral and inferior myocardium. Features  are consistent with a pseudonormal left ventricular filling  pattern, with concomitant abnormal relaxation and  increased  filling pressure (grade 2 diastolic dysfunction). Doppler  parameters are consistent with high ventricular filling pressure. - Mitral valve: There was severe regurgitation. - Left atrium: The atrium was severely dilated. - Right ventricle: The cavity size was mildly dilated. Systolic  function was mildly reduced. - Right atrium: The atrium was mildly dilated. - Atrial septum: There was an atrial septal aneurysm. - Tricuspid valve: There was severe regurgitation. - Pulmonary arteries: Systolic pressure was severely increased. PA  peak pressure: 90 mm Hg (S). - Pericardium, extracardiac: A trivial pericardial effusion was  identified. Impressions: - Global hypokinesis with akinesis of the inferior and inferior  lateral wall; severe LVE; grade 2 diastolic dysfunction with  elevated LV filling pressure; severe LAE; mild RAE/RVE; mildly  reduced RV function; severe MR; severe TR; severely elevated  pulmonary pressure.    ASSESSMENT AND PLAN This is a 65 year old female patient with h/o of CAD, HTN, ICM s/p MDT dual chamber ICD 2013, lymphoma s/p chemo/radiation [id number], chronic systolic CHF, status post VT ablation 2, HTN, HLD, VT, DM, CKD stage III, PMR and depression who presented to [facility] 06[facility]15 with CP or SOB.  Chest pain with history of CAD status post ST  elevation MI in November 2016 - patient's chest pain improved with nitroglycerin. D-dimer is elevated will get VQ scan. Troponin 0.06 -> 0.05 and ECG is unchanged from prior. with Crea 2.0, no cath indicated. V/Q scan negative for PE. With new findings of worsening EF, severe MR and pulm HTN we will need L/RHC. No LV gram due CKD. Her INR 3.64.  Given Vit K yesterday will give again today   Lab Results  Component Value Date   INR 3.26* 07/04/2015   INR 3.64* 06/28/2015   INR 3.56* 06/27/2015     Chronic combine S/D CHF / severe MR / severe Pulm HTN: last EF measured in November 2016 was 35-40% s/p AICD placement  -- Repeat 2D ECHO this admission with worsening EF to 20-25% w/ diffuse HK and akinesis of the inferolateral and inferior myocardium, G2DD, severe MR, mild RV dysfunction/dilation, + atrial septal aneurysm, PA pressure 25mm HG.   -- Will have TEE today to look at MR and mitral annulus plan right and left cath when INR less than 2 tentatively on schedule for tomorrow   Paroxysmal atrial fibrillation: chads 2 vasc score is 4 and patient is on Coumadin. Continue rate limiting medications. Presently in sinus rhythm. INR currently supratheraputic. Holding coumadin. Will need L/RHC when INR drops below at least 2.0   Chronic kidney disease stage IV: creat 2.41. No LV gram with cath Lab Results  Component Value Date   CREATININE 2.27* 06/28/2015   BUN 18 07/18/2015   NA 139 07/04/2015   K 3.3* 06/28/2015   CL 102 06/28/2015   CO2 26 07/16/2015     Chronic anemia: history of GI bleed and workup in 03/2014 was unremarkable for GI bleed. Workup included EGD and colonoscopy. Stool for occult blood negative yesterday. She was given 1 unit of blood yesterday and feeling better. Needs to have Hb > 9.   Diabetes mellitus type 2: on sliding scale coverage. Last hemoglobin A1c recorded in the chart is 5.8.  HLD: cont statin.  Hypothyroidism on Synthroid.  Hypokalemia:  being replaced.      Jenkins Rouge

## 2015-06-29 NOTE — Progress Notes (Signed)
Hypoglycemic Event  CBG: 66  Treatment: D50 IV 25 mL  Symptoms: None  Follow-up CBG: [id number] CBG Result:156  Possible Reasons for Event: Inadequate meal intake  Comments/MD notified:n/a    Mychal Durio M

## 2015-06-29 NOTE — Interval H&P Note (Signed)
History and Physical Interval Note:  07/11/2015 9:23 AM  [name] Terry  has presented today for surgery, with the diagnosis of severe MR  The various methods of treatment have been discussed with the patient and family. After consideration of risks, benefits and other options for treatment, the patient has consented to  Procedure(s): TRANSESOPHAGEAL ECHOCARDIOGRAM (TEE) (N/A) as a surgical intervention .  The patient's history has been reviewed, patient examined, no change in status, stable for surgery.  I have reviewed the patient's chart and labs.  Questions were answered to the patient's satisfaction.     TURNER,TRACI R

## 2015-06-30 ENCOUNTER — Ambulatory Visit (HOSPITAL_COMMUNITY): Admit: 2015-06-30 | Payer: Self-pay | Admitting: Cardiology

## 2015-06-30 ENCOUNTER — Encounter (HOSPITAL_COMMUNITY): Payer: Self-pay | Admitting: Cardiovascular Disease

## 2015-06-30 ENCOUNTER — Encounter: Payer: Medicare Other | Admitting: Internal Medicine

## 2015-06-30 ENCOUNTER — Encounter (HOSPITAL_COMMUNITY): Admission: EM | Disposition: E | Payer: Self-pay | Source: Home / Self Care | Attending: Pulmonary Disease

## 2015-06-30 DIAGNOSIS — I5022 Chronic systolic (congestive) heart failure: Secondary | ICD-10-CM | POA: Diagnosis not present

## 2015-06-30 DIAGNOSIS — I340 Nonrheumatic mitral (valve) insufficiency: Secondary | ICD-10-CM | POA: Diagnosis not present

## 2015-06-30 DIAGNOSIS — I25119 Atherosclerotic heart disease of native coronary artery with unspecified angina pectoris: Secondary | ICD-10-CM | POA: Diagnosis not present

## 2015-06-30 DIAGNOSIS — I2510 Atherosclerotic heart disease of native coronary artery without angina pectoris: Secondary | ICD-10-CM | POA: Diagnosis not present

## 2015-06-30 DIAGNOSIS — I420 Dilated cardiomyopathy: Secondary | ICD-10-CM | POA: Insufficient documentation

## 2015-06-30 DIAGNOSIS — I255 Ischemic cardiomyopathy: Secondary | ICD-10-CM | POA: Diagnosis not present

## 2015-06-30 DIAGNOSIS — Z9861 Coronary angioplasty status: Secondary | ICD-10-CM | POA: Diagnosis not present

## 2015-06-30 HISTORY — PX: CARDIAC CATHETERIZATION: SHX172

## 2015-06-30 LAB — POCT I-STAT 3, ART BLOOD GAS (G3+)
Acid-Base Excess: 2 mmol/L (ref 0.0–2.0)
Bicarbonate: 27.1 mEq/L — ABNORMAL HIGH (ref 20.0–24.0)
O2 Saturation: 100 %
PCO2 ART: 43.5 mmHg (ref 35.0–45.0)
PH ART: 7.403 (ref 7.350–7.450)
TCO2: 28 mmol/L (ref 0–100)
pO2, Arterial: 169 mmHg — ABNORMAL HIGH (ref 80.0–100.0)

## 2015-06-30 LAB — POCT I-STAT 3, VENOUS BLOOD GAS (G3P V)
ACID-BASE EXCESS: 3 mmol/L — AB (ref 0.0–2.0)
Bicarbonate: 29.2 mEq/L — ABNORMAL HIGH (ref 20.0–24.0)
O2 Saturation: 60 %
PH VEN: 7.365 — AB (ref 7.250–7.300)
PO2 VEN: 33 mmHg (ref 31.0–45.0)
TCO2: 31 mmol/L (ref 0–100)
pCO2, Ven: 51 mmHg — ABNORMAL HIGH (ref 45.0–50.0)

## 2015-06-30 LAB — CBC
HCT: 30.4 % — ABNORMAL LOW (ref 36.0–46.0)
HEMOGLOBIN: 9.4 g/dL — AB (ref 12.0–15.0)
MCH: 28.5 pg (ref 26.0–34.0)
MCHC: 30.9 g/dL (ref 30.0–36.0)
MCV: 92.1 fL (ref 78.0–100.0)
PLATELETS: 184 10*3/uL (ref 150–400)
RBC: 3.3 MIL/uL — AB (ref 3.87–5.11)
RDW: 18 % — ABNORMAL HIGH (ref 11.5–15.5)
WBC: 3.6 10*3/uL — AB (ref 4.0–10.5)

## 2015-06-30 LAB — BASIC METABOLIC PANEL
Anion gap: 12 (ref 5–15)
BUN: 20 mg/dL (ref 6–20)
CALCIUM: 8.9 mg/dL (ref 8.9–10.3)
CHLORIDE: 101 mmol/L (ref 101–111)
CO2: 23 mmol/L (ref 22–32)
CREATININE: 2.25 mg/dL — AB (ref 0.44–1.00)
GFR calc non Af Amer: 22 mL/min — ABNORMAL LOW (ref >60)
GFR, EST AFRICAN AMERICAN: 26 mL/min — AB (ref >60)
GLUCOSE: 168 mg/dL — AB (ref 65–99)
Potassium: 4.4 mmol/L (ref 3.5–5.1)
Sodium: 136 mmol/L (ref 135–145)

## 2015-06-30 LAB — GLUCOSE, CAPILLARY
GLUCOSE-CAPILLARY: 321 mg/dL — AB (ref 65–99)
Glucose-Capillary: 149 mg/dL — ABNORMAL HIGH (ref 65–99)
Glucose-Capillary: 190 mg/dL — ABNORMAL HIGH (ref 65–99)
Glucose-Capillary: 163 mg/dL — ABNORMAL HIGH (ref 65–99)

## 2015-06-30 LAB — PROTIME-INR
INR: 1.63 — ABNORMAL HIGH (ref 0.00–1.49)
Prothrombin Time: 19.3 seconds — ABNORMAL HIGH (ref 11.6–15.2)

## 2015-06-30 LAB — MRSA PCR SCREENING: MRSA BY PCR: NEGATIVE

## 2015-06-30 SURGERY — RIGHT/LEFT HEART CATH AND CORONARY ANGIOGRAPHY

## 2015-06-30 MED ORDER — SODIUM CHLORIDE 0.9 % IV SOLN: INTRAVENOUS | Status: DC

## 2015-06-30 MED ORDER — FUROSEMIDE 10 MG/ML IJ SOLN
40.0000 mg | Freq: Once | INTRAMUSCULAR | Status: AC
  Administered 2015-06-30: 40 mg via INTRAVENOUS

## 2015-06-30 MED ORDER — FENTANYL CITRATE (PF) 100 MCG/2ML IJ SOLN
INTRAMUSCULAR | Status: AC
  Filled 2015-06-30: qty 2

## 2015-06-30 MED ORDER — INSULIN ASPART 100 UNIT/ML ~~LOC~~ SOLN
0.0000 [IU] | Freq: Three times a day (TID) | SUBCUTANEOUS | Status: DC
  Administered 2015-06-30: 7 [IU] via SUBCUTANEOUS
  Administered 2015-07-01 (×2): 1 [IU] via SUBCUTANEOUS
  Administered 2015-07-01: 2 [IU] via SUBCUTANEOUS
  Administered 2015-07-03 (×2): 1 [IU] via SUBCUTANEOUS
  Administered 2015-07-04: 2 [IU] via SUBCUTANEOUS

## 2015-06-30 MED ORDER — MIDAZOLAM HCL 2 MG/2ML IJ SOLN
INTRAMUSCULAR | Status: AC
  Filled 2015-06-30: qty 2

## 2015-06-30 MED ORDER — HEPARIN (PORCINE) IN NACL 2-0.9 UNIT/ML-% IJ SOLN
INTRAMUSCULAR | Status: AC
  Filled 2015-06-30: qty 1000

## 2015-06-30 MED ORDER — FUROSEMIDE 10 MG/ML IJ SOLN
INTRAMUSCULAR | Status: AC
  Filled 2015-06-30: qty 4

## 2015-06-30 MED ORDER — LIDOCAINE HCL (PF) 1 % IJ SOLN
INTRAMUSCULAR | Status: AC
  Filled 2015-06-30: qty 30

## 2015-06-30 MED ORDER — MORPHINE SULFATE (PF) 2 MG/ML IV SOLN
2.0000 mg | INTRAVENOUS | Status: DC | PRN
  Administered 2015-07-01: 2 mg via INTRAVENOUS
  Filled 2015-06-30: qty 1

## 2015-06-30 MED ORDER — MORPHINE SULFATE (PF) 2 MG/ML IV SOLN
2.0000 mg | Freq: Once | INTRAVENOUS | Status: AC
  Administered 2015-06-30: 2 mg via INTRAVENOUS

## 2015-06-30 MED ORDER — DIPHENHYDRAMINE HCL 50 MG/ML IJ SOLN
25.0000 mg | Freq: Once | INTRAMUSCULAR | Status: AC
  Administered 2015-06-30: 25 mg via INTRAVENOUS

## 2015-06-30 MED ORDER — LIDOCAINE HCL (PF) 1 % IJ SOLN
INTRAMUSCULAR | Status: DC | PRN
  Administered 2015-06-30: 15 mL via INTRADERMAL
  Administered 2015-06-30: 10 mL via INTRADERMAL
  Administered 2015-06-30: 30 mL via INTRADERMAL

## 2015-06-30 MED ORDER — DIPHENHYDRAMINE HCL 25 MG PO CAPS
25.0000 mg | ORAL_CAPSULE | Freq: Once | ORAL | Status: AC
  Administered 2015-06-30: 25 mg via ORAL
  Filled 2015-06-30: qty 1

## 2015-06-30 MED ORDER — SODIUM CHLORIDE 0.9% FLUSH
3.0000 mL | INTRAVENOUS | Status: DC | PRN
  Administered 2015-07-01: 3 mL via INTRAVENOUS
  Filled 2015-06-30: qty 3

## 2015-06-30 MED ORDER — SODIUM CHLORIDE 0.9 % IV SOLN
250.0000 mL | INTRAVENOUS | Status: DC | PRN
  Administered 2015-07-02 – 2015-07-03 (×2): 250 mL via INTRAVENOUS

## 2015-06-30 MED ORDER — ONDANSETRON HCL 4 MG/2ML IJ SOLN
4.0000 mg | Freq: Four times a day (QID) | INTRAMUSCULAR | Status: DC | PRN
  Administered 2015-07-09 – 2015-07-11 (×2): 4 mg via INTRAVENOUS
  Filled 2015-06-30 (×2): qty 2

## 2015-06-30 MED ORDER — DIPHENHYDRAMINE HCL 25 MG PO CAPS
25.0000 mg | ORAL_CAPSULE | Freq: Every evening | ORAL | Status: DC | PRN
  Administered 2015-07-01 – 2015-07-03 (×3): 25 mg via ORAL
  Filled 2015-06-30 (×4): qty 1

## 2015-06-30 MED ORDER — ACETAMINOPHEN 325 MG PO TABS: 650.0000 mg | ORAL_TABLET | ORAL | Status: DC | PRN

## 2015-06-30 MED ORDER — HEPARIN (PORCINE) IN NACL 2-0.9 UNIT/ML-% IJ SOLN
INTRAMUSCULAR | Status: DC | PRN
  Administered 2015-06-30: 1500 mL

## 2015-06-30 MED ORDER — MIDAZOLAM HCL 2 MG/2ML IJ SOLN
INTRAMUSCULAR | Status: DC | PRN
  Administered 2015-06-30 (×2): 1 mg via INTRAVENOUS
  Administered 2015-06-30: 2 mg via INTRAVENOUS

## 2015-06-30 MED ORDER — NITROGLYCERIN 1 MG/10 ML FOR IR/CATH LAB
INTRA_ARTERIAL | Status: AC
  Filled 2015-06-30: qty 10

## 2015-06-30 MED ORDER — MORPHINE SULFATE (PF) 2 MG/ML IV SOLN
INTRAVENOUS | Status: AC
  Filled 2015-06-30: qty 1

## 2015-06-30 MED ORDER — FENTANYL CITRATE (PF) 100 MCG/2ML IJ SOLN
INTRAMUSCULAR | Status: DC | PRN
  Administered 2015-06-30 (×2): 25 ug via INTRAVENOUS

## 2015-06-30 MED ORDER — SODIUM CHLORIDE 0.9% FLUSH
3.0000 mL | Freq: Two times a day (BID) | INTRAVENOUS | Status: DC
  Administered 2015-06-30 – 2015-07-06 (×7): 3 mL via INTRAVENOUS

## 2015-06-30 MED ORDER — HEPARIN SODIUM (PORCINE) 5000 UNIT/ML IJ SOLN
5000.0000 [IU] | Freq: Three times a day (TID) | INTRAMUSCULAR | Status: DC
  Administered 2015-07-01 – 2015-07-04 (×10): 5000 [IU] via SUBCUTANEOUS
  Filled 2015-06-30 (×10): qty 1

## 2015-06-30 SURGICAL SUPPLY — 12 items
CATH INFINITI 5FR JL5 (CATHETERS) ×3 IMPLANT
CATH INFINITI 5FR MULTPACK ANG (CATHETERS) ×3 IMPLANT
CATH SWAN GANZ 7F STRAIGHT (CATHETERS) ×3 IMPLANT
KIT HEART LEFT (KITS) ×3 IMPLANT
KIT HEART RIGHT NAMIC (KITS) ×3 IMPLANT
PACK CARDIAC CATHETERIZATION (CUSTOM PROCEDURE TRAY) ×3 IMPLANT
SHEATH PINNACLE 5F 10CM (SHEATH) ×3 IMPLANT
SHEATH PINNACLE 7F 10CM (SHEATH) ×3 IMPLANT
SYR MEDRAD MARK V 150ML (SYRINGE) ×3 IMPLANT
TRANSDUCER W/STOPCOCK (MISCELLANEOUS) ×6 IMPLANT
TUBING CIL FLEX 10 FLL-RA (TUBING) ×3 IMPLANT
WIRE EMERALD 3MM-J .035X150CM (WIRE) ×3 IMPLANT

## 2015-06-30 NOTE — Progress Notes (Signed)
C/O hard to breath. Patient w/d, panting intermittently. Dr. Claiborne Billings notified. Orders.

## 2015-06-30 NOTE — Interval H&P Note (Signed)
Cath Lab Visit (complete for each Cath Lab visit)  Clinical Evaluation Leading to the Procedure:   ACS: No.  Non-ACS:    Anginal Classification: CCS IV  Anti-ischemic medical therapy: Maximal Therapy (2 or more classes of medications)  Non-Invasive Test Results: No non-invasive testing performed  Prior CABG: No previous CABG      History and Physical Interval Note:  07/03/2015 7:49 AM  Darylene Price Needle  has presented today for surgery, with the diagnosis of severe mr  The various methods of treatment have been discussed with the patient and family. After consideration of risks, benefits and other options for treatment, the patient has consented to  Procedure(s): Right/Left Heart Cath and Coronary Angiography (N/A) as a surgical intervention .  The patient's history has been reviewed, patient examined, no change in status, stable for surgery.  I have reviewed the patient's chart and labs.  Questions were answered to the patient's satisfaction.     KELLY,THOMAS A

## 2015-06-30 NOTE — Progress Notes (Signed)
[facility] PROGRESS NOTE  [name] Terry [id number] DOB: April 18, 1950 DOA: 07/11/2015 PCP: Pcp Not In System Admit HPI / Brief Narrative: [name] Terry is a 65 y.o. BF PMHx Anxiety, CAD native artery, S/P stenting, Chronic Systolic CHF S/P ICD placement, proximal respiratory ablation, DM Type 2 with complications  Chronic anemia, CKD Stage IV   Presents to the ER because of chest pain. Patient has been having chest pain since yesterday morning. Pain is mostly on the left side of the chest radiating to her lower back. Had one episode of nausea vomiting and has been exertional shortness of breath. Pain persisted throughout the day. EKG was showing nonspecific changes and chest x-ray was unremarkable. Troponins are negative. Patient's chest pain improved with sublingual nitroglycerin. Patient's d-dimer is elevated. Since patient has chronic kidney disease will need VQ scan and cannot get CT scan to rule out PE. Patient states she was switched from Eliquis to Coumadin while she was in a nursing home. Stool for occult blood has been negative.   HPI/Subjective: Some dyspnea overnight, no chest pain today  Assessment/Plan: Chest pain/CAD status post stenting in 2015 -Troponin minimally elevated, but ECHO with inferior and inferolateral WMA, EF down to 20-25% from 35-40% in Nov -had inferior STEMI in Nov,- cardiac cath then showed 100% stenosis of prox PDA, patent stent in RCA with otherwise normal coronary arteries -VQ scan : negative for PE -per Cards, plan for LHC today -continue ASA/coreg/statin  Acute on Chronic Systolic CHF  - EF now down to 20-25% with new WMA - status post AICD placement - - continue Coreg, Hydralazine, Isosorbide dinitrate 20 mg TID -On Torsemide. 20 mg M/W/F at home, required Iv lasix x1 4/5 -4/4 transfused 1 unit PRBC -will likely need lasix soon, hold now due to need for contrast soon  Paroxysmal atrial fibrillation chads 2 vasc score is 4  -continue coreg,  amiodarone -Coumadin on hold for LHC, INR 3.5, gived vitamin K x1 yesterday, inr now 1.6  Chronic kidney disease stage IV -baseline 2.8  -stable, monitor, will get contrast for LHC today  Chronic anemia  -likely due to CKD  -workup in January 2016 was unremarkable for signs of GI bleed. Workup included EGD and colonoscopy. 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 -SSI  Hyperlipidemia - Lipitor 40 mg daily  Hypothyroidism  -On Levothyroxine 75 g daily  -TSH elevated, increased dose to 165mcg  Hypokalemia -replaced  Hypomagnesemia -replaced  Cdiff colonization -no more diarrhea, Cdiff Ag positive and toxin negative -no need to Rx  DVT proph: on warfarin, held for Cath, resume after this  Code Status: Full Family Communication: None  Disposition Plan: back to SNF when stable  Consultants: [place] [facility]   Procedures:  Cultures NA   Antibiotics: NA   DVT prophylaxis COumadin   Objective: Filed Vitals:   07/04/2015 1335 07/11/2015 1405 07/14/2015 1435 07/08/2015 1728  BP: 149/87 135/78 145/74 143/86  Pulse: 80 78 80 82  Temp:    98 F (36.7 C)  TempSrc:    Oral  Resp: 19 16 21 17   Height:      Weight:      SpO2: 100% 100% 100% 95%   No intake or output data in the 24 hours ending 07/06/2015 1748 Filed Weights   06/28/15 0500 06/28/2015 0500 07/18/2015 0504  Weight:  85.14 kg (187 lb 11.2 oz) 85.73 kg (189 lb) 84.414 kg (186 lb 1.6 oz)     Exam: General: AAOx3, no distress HEENT: PERRLA Lungs: some basilar crackles Cardiovascular: Regular rate and rhythm without murmur gallop or rub normal S1 and S2 Abdomen:negative abdominal pain, negative dysphagia, nondistended, positive soft, bowel sounds, no rebound, no ascites, no appreciable mass, left chest wall AICD present Extremities: No  significant cyanosis, clubbing, or edema bilateral lower extremities Psychiatric:  Flat affect Neurologic:  Non focal    Data Reviewed: Basic Metabolic Panel:  Recent Labs Lab 07/09/2015 1832 06/27/15 0546 06/27/15 0806 06/28/15 0418 07/13/2015 0215 07/11/2015 0340  NA 140 138  --  141 139 136  K 3.2* 2.7*  --  3.8 3.3* 4.4  CL 100* 99*  --  103 102 101  CO2 27 27  --  28 26 23   GLUCOSE 132* 89  --  123* 70 168*  BUN 16 16  --  18 18 20   CREATININE 2.08* 2.04*  --  2.41* 2.27* 2.25*  CALCIUM 9.2 8.7*  --  9.0 8.7* 8.9  MG  --   --  1.6* 1.9  --   --    Liver Function Tests:  Recent Labs Lab 06/27/15 0546 06/28/15 0418  AST 36 41  ALT 25 28  ALKPHOS 59 61  BILITOT 0.9 0.8  PROT 6.1* 6.4*  ALBUMIN 2.7* 2.6*    Recent Labs Lab 06/27/15 0546  LIPASE 36   No results for input(s): AMMONIA in the last 168 hours. CBC:  Recent Labs Lab 06/28/2015 1832 06/27/15 0546 06/28/15 0418 07/18/2015 0215 06/26/2015 0340  WBC 5.8 4.7 3.8* 4.3 3.6*  NEUTROABS  --  3.3 2.6  --   --   HGB 8.9* 8.1* 9.2* 9.2* 9.4*  HCT 29.0* 26.3* 28.0* 27.7* 30.4*  MCV 92.4 92.3 92.4 92.3 92.1  PLT 231 204 180 164 184   Cardiac Enzymes:  Recent Labs Lab 06/27/15 0546 06/27/15 1025 06/27/15 1650  TROPONINI 0.06* 0.05* 0.05*   BNP (last 3 results)  Recent Labs  02/21/15 1042 03/01/15 0839 04/13/15 2215  BNP 386.1* 452.0* 894.0*    ProBNP (last 3 results) No results for input(s): PROBNP in the last 8760 hours.  CBG:  Recent Labs Lab 06/24/2015 1122 07/04/2015 1348 07/04/2015 1630 07/11/2015 2154 07/08/2015 0616  GLUCAP 58* 119* 130* 129* 149*    Recent Results (from the past 240 hour(s))  MRSA PCR Screening     Status: None   Collection Time: 06/27/15  3:54 AM  Result Value Ref Range Status   MRSA by PCR NEGATIVE NEGATIVE Final    Comment:        The GeneXpert MRSA Assay (FDA approved for NASAL specimens only), is one component of a comprehensive MRSA  colonization surveillance program. It is not intended to diagnose MRSA infection nor to guide or monitor treatment for MRSA infections.   C difficile quick scan w PCR reflex     Status: Abnormal   Collection Time: 06/28/15  6:52 PM  Result Value Ref Range Status   C Diff antigen POSITIVE (A) NEGATIVE Final   C Diff toxin NEGATIVE NEGATIVE Final   C Diff interpretation   Final    C. difficile present, but toxin not detected. This indicates colonization. In most cases, this does not require treatment. If patient has signs and symptoms consistent with colitis, consider treatment. Requires ENTERIC precautions.     Studies: No results found.  Scheduled Meds: .  allopurinol  100 mg Oral Daily  . amiodarone  200 mg Oral Daily  . aspirin EC  81 mg Oral Daily  . atorvastatin  40 mg Oral q1800  . carvedilol  6.25 mg Oral BID WC  . colchicine  0.3 mg Oral Daily  . famotidine  20 mg Oral Daily  . ferrous sulfate  325 mg Oral BID WC  . [START ON 07/01/2015] heparin  5,000 Units Subcutaneous 3 times per day  . hydrALAZINE  25 mg Oral 3 times per day  . [START ON 07/01/2015] insulin aspart  0-9 Units Subcutaneous TID WC  . isosorbide dinitrate  20 mg Oral TID  . levothyroxine  100 mcg Oral QAC breakfast  . pantoprazole  40 mg Oral Daily  . PARoxetine  10 mg Oral Daily  . predniSONE  60 mg Oral Q breakfast  . QUEtiapine  25 mg Oral QHS  . sodium chloride flush  3 mL Intravenous Q12H  . sodium chloride flush  3 mL Intravenous Q12H   Continuous Infusions:   Principal Problem:   Chest pain Active Problems:   HTN (hypertension)   Cardiomyopathy, ischemic-EF 35-40%   CAD S/P prior RCA PCI, cath 05/03/2013- med Rx, STEMI 01/2015 - 100% prox PDA, treated medically   DM2 with nephropathy   Mitral regurgitation   Persistent atrial fibrillation (HCC)   Chronic systolic CHF (congestive heart failure) ([place])   Chest pain at rest   Controlled diabetes mellitus type 2 with complications ([place])    Hypomagnesemia   Pain in the chest   Congestive dilated cardiomyopathy ([place])    Time spent: 40 minutes    [facility]  [facility] Pager [phone]. If 7PM-7AM, please contact night-coverage at [email or web address], password [facility] 07/07/2015, 5:48 PM  LOS: 3 days    Care during the described time interval was provided by me .  I have reviewed this patient's available data, including medical history, events of note, physical examination, and all test results as part of my evaluation. I have personally reviewed and interpreted all radiology studies.

## 2015-06-30 NOTE — Progress Notes (Signed)
Site area: lt groin Site Prior to Removal:  Level 0   Pressure Applied For:  20 minutes Manual:   yes Patient Status During Pull:  stable Post Pull Site:  Level   0 Post Pull Instructions Given:  yes Post Pull Pulses Present: yes Dressing Applied:  tegaderm Bedrest begins @   [id number] Comments:  Rt groin level 0

## 2015-06-30 NOTE — Progress Notes (Signed)
Eating [place] sandwich. Visitor in

## 2015-06-30 NOTE — Progress Notes (Signed)
Site area: rt groin Site Prior to Removal:  Level  0 Pressure Applied For:  20 minutes Manual:   yes Patient Status During Pull:  stable Post Pull Site:  Level  0 Post Pull Instructions Given:  yes Post Pull Pulses Present: yes Dressing Applied:  tegaderm Bedrest begins @  Comments:   

## 2015-06-30 NOTE — H&P (View-Only) (Signed)
Patient ID: VERLYN DETAR, female   DOB: November 04, 1950, 65 y.o.   MRN: [id number]   Patient Name: [name] Terry Date of Encounter: 06/28/2015     Principal Problem:   Chest pain Active Problems:   HTN (hypertension)   Cardiomyopathy, ischemic-EF 35-40%   CAD S/P prior RCA PCI, cath 05/03/2013- med Rx, STEMI 01/2015 - 100% prox PDA, treated medically   DM2 with nephropathy   Persistent atrial fibrillation (HCC)   Chronic systolic CHF (congestive heart failure) ([place])   Chest pain at rest   Controlled diabetes mellitus type 2 with complications ([place])   Hypomagnesemia   Primary Cardiologist: [facility] , Dr Allred  SUBJECTIVE   No more CP. More SOB, mild orthopnea and PND. Slept well   CURRENT MEDS . [MAR Hold] allopurinol  100 mg Oral Daily  . [MAR Hold] amiodarone  200 mg Oral Daily  . [MAR Hold] aspirin EC  81 mg Oral Daily  . [MAR Hold] atorvastatin  40 mg Oral q1800  . [MAR Hold] carvedilol  6.25 mg Oral BID WC  . [MAR Hold] colchicine  0.6 mg Oral Daily  . [MAR Hold] famotidine  20 mg Oral Daily  . [MAR Hold] ferrous sulfate  325 mg Oral BID WC  . [MAR Hold] hydrALAZINE  25 mg Oral 3 times per day  . [MAR Hold] insulin aspart  0-9 Units Subcutaneous TID WC  . [MAR Hold] isosorbide dinitrate  20 mg Oral TID  . [MAR Hold] levothyroxine  100 mcg Oral QAC breakfast  . [MAR Hold] pantoprazole  40 mg Oral Daily  . [MAR Hold] PARoxetine  10 mg Oral Daily  . [MAR Hold] potassium chloride SA  20 mEq Oral Once per day on Mon Wed Fri  . [MAR Hold] QUEtiapine  25 mg Oral QHS  . [facility] Hold] sodium chloride flush  3 mL Intravenous Q12H    OBJECTIVE  Filed Vitals:   06/28/15 1400 06/28/15 2123 07/15/2015 0500 06/24/2015 0545  BP: 143/99 142/87  123/71  Pulse: 88 90    Temp:  98.8 F (37.1 C)  98.5 F (36.9 C)  TempSrc:  Oral  Oral  Resp: 18 18    Height:      Weight:   85.73 kg (189 lb)   SpO2: 100% 100%  99%   No intake or output data in the 24 hours ending 07/01/2015 0825 Filed  Weights   06/27/15 0335 06/28/15 0500 06/24/2015 0500  Weight: 82.827 kg (182 lb 9.6 oz) 85.14 kg (187 lb 11.2 oz) 85.73 kg (189 lb)    PHYSICAL EXAM  General: Pleasant, NAD. Neuro: Alert and oriented X 3. Moves all extremities spontaneously. Psych: Normal affect. HEENT:  Normal  Neck: Supple without bruits or JVD. Lungs:  Resp regular and unlabored, decreased breath sounds at bases. Heart: RRR no s3, s4, or Loud systolic murmur heard best at apex. Abdomen: Soft, non-tender, non-distended, BS + x 4.  Extremities: No clubbing, cyanosis or trace LE edema. DP/PT/Radials 2+ and equal bilaterally.  Accessory Clinical Findings  CBC  Recent Labs  06/27/15 0546 06/28/15 0418 07/01/2015 0215  WBC 4.7 3.8* 4.3  NEUTROABS 3.3 2.6  --   HGB 8.1* 9.2* 9.2*  HCT 26.3* 28.0* 27.7*  MCV 92.3 92.4 92.3  PLT 204 180 [id number]   Basic Metabolic Panel  Recent Labs  06/27/15 0806 06/28/15 0418 07/15/2015 0215  NA  --  141 139  K  --  3.8 3.3*  CL  --  103 102  CO2  --  28 26  GLUCOSE  --  123* 70  BUN  --  18 18  CREATININE  --  2.41* 2.27*  CALCIUM  --  9.0 8.7*  MG 1.6* 1.9  --    Liver Function Tests  Recent Labs  06/27/15 0546 06/28/15 0418  AST 36 41  ALT 25 28  ALKPHOS 59 61  BILITOT 0.9 0.8  PROT 6.1* 6.4*  ALBUMIN 2.7* 2.6*    Recent Labs  06/27/15 0546  LIPASE 36   Cardiac Enzymes  Recent Labs  06/27/15 0546 06/27/15 1025 06/27/15 1650  TROPONINI 0.06* 0.05* 0.05*   BNP Invalid input(s): POCBNP D-Dimer No results for input(s): DDIMER in the last 72 hours. Hemoglobin A1C  Recent Labs  06/28/15 0423  HGBA1C 6.1*   Fasting Lipid Panel  Recent Labs  06/28/15 0418  CHOL 141  HDL 46  LDLCALC 80  TRIG 74  CHOLHDL 3.1   Thyroid Function Tests  Recent Labs  06/27/15 0546  TSH 5.007*    TELE  NSR  Radiology/Studies  Dg Chest 2 View  06/28/2015  CLINICAL DATA:  eval for issues with dyspnea - pt seemed confused, unable to give a good  history - hx of lymphoma, vascular disease, CHF, htn, diabetes, stroke, CAD, PAF, mitral regurgitation EXAM: CHEST  2 VIEW COMPARISON:  07/08/2015 FINDINGS: There is central vascular congestion and interstitial thickening. No focal lung consolidation. No pleural effusion or pneumothorax. Elevation the right hemidiaphragm is stable. Cardiac silhouette is mildly enlarged. No mediastinal or hilar masses or evidence of adenopathy. Left anterior chest wall AICD is stable and well positioned. Bony thorax is demineralized but intact. IMPRESSION: 1. Mild congestive heart failure suspected reflected by central vascular congestion and interstitial thickening with mild cardiomegaly. No evidence of pneumonia. Electronically Signed   By: Lajean Manes M.D.   On: 06/28/2015 15:10   Dg Chest 2 View  07/16/2015  CLINICAL DATA:  Chest pain, shortness of breath and cough. EXAM: CHEST - 2 VIEW COMPARISON:  04/13/2015 FINDINGS: Stable moderate cardiac enlargement appearance of a dual-chamber pacing/ICD device. There is no evidence of pulmonary edema, consolidation, pneumothorax, nodule or pleural fluid. IMPRESSION: Stable cardiac enlargement and appearance of pacer/ICD. No acute edema identified. Electronically Signed   By: Aletta Edouard M.D.   On: 07/18/2015 18:42   Nm Pulmonary Perf And Vent  06/27/2015  CLINICAL DATA:  Shortness of breath, chest pain since yesterday EXAM: NUCLEAR MEDICINE VENTILATION - PERFUSION LUNG SCAN TECHNIQUE: Ventilation images were obtained in multiple projections using inhaled aerosol Tc-62m DTPA. Perfusion images were obtained in multiple projections after intravenous injection of Tc-46m MAA. RADIOPHARMACEUTICALS:  31.2 mCi Technetium-45m DTPA aerosol inhalation and 4.2 mCi Technetium-78m MAA IV COMPARISON:  VQ scan 04/14/2015.  Chest x-ray 07/17/2015. FINDINGS: Ventilation: No focal ventilation defect. Perfusion: No wedge shaped peripheral perfusion defects to suggest acute pulmonary embolism.  IMPRESSION: No evidence of pulmonary embolus. Electronically Signed   By: Rolm Baptise M.D.   On: 06/27/2015 15:56    2D ECHO: 06/27/2015 LV EF: 20% - 25% Study Conclusions - Left ventricle: The cavity size was severely dilated. Wall  thickness was increased in a pattern of mild LVH. Systolic  function was severely reduced. The estimated ejection fraction  was in the range of 20% to 25%. Diffuse hypokinesis. There is  akinesis of the inferolateral and inferior myocardium. Features  are consistent with a pseudonormal left ventricular filling  pattern, with concomitant abnormal relaxation and  increased  filling pressure (grade 2 diastolic dysfunction). Doppler  parameters are consistent with high ventricular filling pressure. - Mitral valve: There was severe regurgitation. - Left atrium: The atrium was severely dilated. - Right ventricle: The cavity size was mildly dilated. Systolic  function was mildly reduced. - Right atrium: The atrium was mildly dilated. - Atrial septum: There was an atrial septal aneurysm. - Tricuspid valve: There was severe regurgitation. - Pulmonary arteries: Systolic pressure was severely increased. PA  peak pressure: 90 mm Hg (S). - Pericardium, extracardiac: A trivial pericardial effusion was  identified. Impressions: - Global hypokinesis with akinesis of the inferior and inferior  lateral wall; severe LVE; grade 2 diastolic dysfunction with  elevated LV filling pressure; severe LAE; mild RAE/RVE; mildly  reduced RV function; severe MR; severe TR; severely elevated  pulmonary pressure.    ASSESSMENT AND PLAN This is a 65 year old female patient with h/o of CAD, HTN, ICM s/p MDT dual chamber ICD 2013, lymphoma s/p chemo/radiation [id number], chronic systolic CHF, status post VT ablation 2, HTN, HLD, VT, DM, CKD stage III, PMR and depression who presented to [facility] on 06/[facility]/15 with CP or SOB.  Chest pain with history of CAD status post ST  elevation MI in November 2016 - patient's chest pain improved with nitroglycerin. D-dimer is elevated will get VQ scan. Troponin 0.06 -> 0.05 and ECG is unchanged from prior. with Crea 2.0, no cath indicated. V/Q scan negative for PE. With new findings of worsening EF, severe MR and pulm HTN we will need L/RHC. No LV gram due CKD. Her INR 3.64.  Given Vit K yesterday will give again today   Lab Results  Component Value Date   INR 3.26* 07/18/2015   INR 3.64* 06/28/2015   INR 3.56* 06/27/2015     Chronic combine S/D CHF / severe MR / severe Pulm HTN: last EF measured in November 2016 was 35-40% s/p AICD placement  -- Repeat 2D ECHO this admission with worsening EF to 20-25% w/ diffuse HK and akinesis of the inferolateral and inferior myocardium, G2DD, severe MR, mild RV dysfunction/dilation, + atrial septal aneurysm, PA pressure 85mm HG.   -- Will have TEE today to look at MR and mitral annulus plan right and left cath when INR less than 2 tentatively on schedule for tomorrow   Paroxysmal atrial fibrillation: chads 2 vasc score is 4 and patient is on Coumadin. Continue rate limiting medications. Presently in sinus rhythm. INR currently supratheraputic. Holding coumadin. Will need L/RHC when INR drops below at least 2.0   Chronic kidney disease stage IV: creat 2.41. No LV gram with cath Lab Results  Component Value Date   CREATININE 2.27* 07/20/2015   BUN 18 07/18/2015   NA 139 07/13/2015   K 3.3* 06/28/2015   CL 102 07/06/2015   CO2 26 07/18/2015     Chronic anemia: history of GI bleed and workup in 03/2014 was unremarkable for GI bleed. Workup included EGD and colonoscopy. Stool for occult blood negative yesterday. She was given 1 unit of blood yesterday and feeling better. Needs to have Hb > 9.   Diabetes mellitus type 2: on sliding scale coverage. Last hemoglobin A1c recorded in the chart is 5.8.  HLD: cont statin.  Hypothyroidism on Synthroid.  Hypokalemia:  being replaced.      Jenkins Rouge

## 2015-06-30 NOTE — Progress Notes (Signed)
Inserted 54F foley catheter w/ease.

## 2015-06-30 NOTE — Progress Notes (Signed)
Breathing easier. Resting quietly.

## 2015-06-30 NOTE — Progress Notes (Signed)
PT Cancellation Note  Patient Details Name: [name] Terry MRN: [id number] DOB: July 12, 1950   Cancelled Treatment:    Reason Eval/Treat Not Completed: Patient at procedure or test/unavailable Pt off floor at Cath lab. Will follow up.   [name] 07/05/2015, 8:55 AM Wray Kearns, PT, DPT [phone]

## 2015-07-01 DIAGNOSIS — I255 Ischemic cardiomyopathy: Secondary | ICD-10-CM | POA: Diagnosis not present

## 2015-07-01 DIAGNOSIS — I340 Nonrheumatic mitral (valve) insufficiency: Secondary | ICD-10-CM | POA: Diagnosis present

## 2015-07-01 DIAGNOSIS — Z923 Personal history of irradiation: Secondary | ICD-10-CM | POA: Diagnosis not present

## 2015-07-01 DIAGNOSIS — N170 Acute kidney failure with tubular necrosis: Secondary | ICD-10-CM | POA: Diagnosis not present

## 2015-07-01 DIAGNOSIS — I481 Persistent atrial fibrillation: Secondary | ICD-10-CM | POA: Diagnosis present

## 2015-07-01 DIAGNOSIS — R570 Cardiogenic shock: Secondary | ICD-10-CM | POA: Diagnosis not present

## 2015-07-01 DIAGNOSIS — T45515A Adverse effect of anticoagulants, initial encounter: Secondary | ICD-10-CM | POA: Diagnosis present

## 2015-07-01 DIAGNOSIS — E669 Obesity, unspecified: Secondary | ICD-10-CM | POA: Diagnosis present

## 2015-07-01 DIAGNOSIS — K921 Melena: Secondary | ICD-10-CM | POA: Diagnosis not present

## 2015-07-01 DIAGNOSIS — I5023 Acute on chronic systolic (congestive) heart failure: Secondary | ICD-10-CM | POA: Diagnosis not present

## 2015-07-01 DIAGNOSIS — K319 Disease of stomach and duodenum, unspecified: Secondary | ICD-10-CM | POA: Diagnosis present

## 2015-07-01 DIAGNOSIS — R0789 Other chest pain: Secondary | ICD-10-CM | POA: Diagnosis not present

## 2015-07-01 DIAGNOSIS — Z9861 Coronary angioplasty status: Secondary | ICD-10-CM | POA: Diagnosis not present

## 2015-07-01 DIAGNOSIS — J810 Acute pulmonary edema: Secondary | ICD-10-CM | POA: Diagnosis not present

## 2015-07-01 DIAGNOSIS — I252 Old myocardial infarction: Secondary | ICD-10-CM | POA: Diagnosis not present

## 2015-07-01 DIAGNOSIS — J690 Pneumonitis due to inhalation of food and vomit: Secondary | ICD-10-CM | POA: Diagnosis not present

## 2015-07-01 DIAGNOSIS — D62 Acute posthemorrhagic anemia: Secondary | ICD-10-CM | POA: Diagnosis present

## 2015-07-01 DIAGNOSIS — N390 Urinary tract infection, site not specified: Secondary | ICD-10-CM | POA: Diagnosis not present

## 2015-07-01 DIAGNOSIS — Z515 Encounter for palliative care: Secondary | ICD-10-CM | POA: Diagnosis present

## 2015-07-01 DIAGNOSIS — R079 Chest pain, unspecified: Secondary | ICD-10-CM | POA: Diagnosis not present

## 2015-07-01 DIAGNOSIS — I469 Cardiac arrest, cause unspecified: Secondary | ICD-10-CM | POA: Diagnosis not present

## 2015-07-01 DIAGNOSIS — I480 Paroxysmal atrial fibrillation: Secondary | ICD-10-CM | POA: Diagnosis not present

## 2015-07-01 DIAGNOSIS — M353 Polymyalgia rheumatica: Secondary | ICD-10-CM | POA: Diagnosis present

## 2015-07-01 DIAGNOSIS — I472 Ventricular tachycardia: Secondary | ICD-10-CM | POA: Diagnosis not present

## 2015-07-01 DIAGNOSIS — K269 Duodenal ulcer, unspecified as acute or chronic, without hemorrhage or perforation: Secondary | ICD-10-CM | POA: Diagnosis present

## 2015-07-01 DIAGNOSIS — E785 Hyperlipidemia, unspecified: Secondary | ICD-10-CM | POA: Diagnosis present

## 2015-07-01 DIAGNOSIS — F329 Major depressive disorder, single episode, unspecified: Secondary | ICD-10-CM | POA: Diagnosis present

## 2015-07-01 DIAGNOSIS — I272 Other secondary pulmonary hypertension: Secondary | ICD-10-CM | POA: Diagnosis not present

## 2015-07-01 DIAGNOSIS — I10 Essential (primary) hypertension: Secondary | ICD-10-CM | POA: Diagnosis not present

## 2015-07-01 DIAGNOSIS — Z8673 Personal history of transient ischemic attack (TIA), and cerebral infarction without residual deficits: Secondary | ICD-10-CM | POA: Diagnosis not present

## 2015-07-01 DIAGNOSIS — Z79899 Other long term (current) drug therapy: Secondary | ICD-10-CM | POA: Diagnosis not present

## 2015-07-01 DIAGNOSIS — Z955 Presence of coronary angioplasty implant and graft: Secondary | ICD-10-CM | POA: Diagnosis not present

## 2015-07-01 DIAGNOSIS — F419 Anxiety disorder, unspecified: Secondary | ICD-10-CM | POA: Diagnosis present

## 2015-07-01 DIAGNOSIS — J9621 Acute and chronic respiratory failure with hypoxia: Secondary | ICD-10-CM | POA: Diagnosis not present

## 2015-07-01 DIAGNOSIS — J9620 Acute and chronic respiratory failure, unspecified whether with hypoxia or hypercapnia: Secondary | ICD-10-CM | POA: Diagnosis not present

## 2015-07-01 DIAGNOSIS — I4901 Ventricular fibrillation: Secondary | ICD-10-CM | POA: Diagnosis not present

## 2015-07-01 DIAGNOSIS — Z91041 Radiographic dye allergy status: Secondary | ICD-10-CM | POA: Diagnosis not present

## 2015-07-01 DIAGNOSIS — I130 Hypertensive heart and chronic kidney disease with heart failure and stage 1 through stage 4 chronic kidney disease, or unspecified chronic kidney disease: Secondary | ICD-10-CM | POA: Diagnosis present

## 2015-07-01 DIAGNOSIS — E039 Hypothyroidism, unspecified: Secondary | ICD-10-CM | POA: Diagnosis present

## 2015-07-01 DIAGNOSIS — Z87891 Personal history of nicotine dependence: Secondary | ICD-10-CM | POA: Diagnosis not present

## 2015-07-01 DIAGNOSIS — E876 Hypokalemia: Secondary | ICD-10-CM | POA: Diagnosis present

## 2015-07-01 DIAGNOSIS — A047 Enterocolitis due to Clostridium difficile: Secondary | ICD-10-CM | POA: Diagnosis not present

## 2015-07-01 DIAGNOSIS — J9600 Acute respiratory failure, unspecified whether with hypoxia or hypercapnia: Secondary | ICD-10-CM | POA: Diagnosis not present

## 2015-07-01 DIAGNOSIS — Z888 Allergy status to other drugs, medicaments and biological substances status: Secondary | ICD-10-CM | POA: Diagnosis not present

## 2015-07-01 DIAGNOSIS — E871 Hypo-osmolality and hyponatremia: Secondary | ICD-10-CM | POA: Diagnosis not present

## 2015-07-01 DIAGNOSIS — D6832 Hemorrhagic disorder due to extrinsic circulating anticoagulants: Secondary | ICD-10-CM | POA: Diagnosis present

## 2015-07-01 DIAGNOSIS — I209 Angina pectoris, unspecified: Secondary | ICD-10-CM | POA: Diagnosis not present

## 2015-07-01 DIAGNOSIS — K449 Diaphragmatic hernia without obstruction or gangrene: Secondary | ICD-10-CM | POA: Diagnosis present

## 2015-07-01 DIAGNOSIS — I5043 Acute on chronic combined systolic (congestive) and diastolic (congestive) heart failure: Secondary | ICD-10-CM | POA: Diagnosis present

## 2015-07-01 DIAGNOSIS — J9601 Acute respiratory failure with hypoxia: Secondary | ICD-10-CM | POA: Diagnosis not present

## 2015-07-01 DIAGNOSIS — R0489 Hemorrhage from other sites in respiratory passages: Secondary | ICD-10-CM | POA: Diagnosis present

## 2015-07-01 DIAGNOSIS — Z95810 Presence of automatic (implantable) cardiac defibrillator: Secondary | ICD-10-CM | POA: Diagnosis not present

## 2015-07-01 DIAGNOSIS — E1122 Type 2 diabetes mellitus with diabetic chronic kidney disease: Secondary | ICD-10-CM | POA: Diagnosis present

## 2015-07-01 DIAGNOSIS — I253 Aneurysm of heart: Secondary | ICD-10-CM | POA: Diagnosis present

## 2015-07-01 DIAGNOSIS — R042 Hemoptysis: Secondary | ICD-10-CM | POA: Diagnosis not present

## 2015-07-01 DIAGNOSIS — G9340 Encephalopathy, unspecified: Secondary | ICD-10-CM | POA: Diagnosis not present

## 2015-07-01 DIAGNOSIS — B9620 Unspecified Escherichia coli [E. coli] as the cause of diseases classified elsewhere: Secondary | ICD-10-CM | POA: Diagnosis not present

## 2015-07-01 DIAGNOSIS — Z833 Family history of diabetes mellitus: Secondary | ICD-10-CM | POA: Diagnosis not present

## 2015-07-01 DIAGNOSIS — K219 Gastro-esophageal reflux disease without esophagitis: Secondary | ICD-10-CM | POA: Diagnosis present

## 2015-07-01 DIAGNOSIS — I2510 Atherosclerotic heart disease of native coronary artery without angina pectoris: Secondary | ICD-10-CM | POA: Diagnosis not present

## 2015-07-01 DIAGNOSIS — Z7982 Long term (current) use of aspirin: Secondary | ICD-10-CM | POA: Diagnosis not present

## 2015-07-01 DIAGNOSIS — E870 Hyperosmolality and hypernatremia: Secondary | ICD-10-CM | POA: Diagnosis not present

## 2015-07-01 DIAGNOSIS — R579 Shock, unspecified: Secondary | ICD-10-CM | POA: Diagnosis not present

## 2015-07-01 DIAGNOSIS — E872 Acidosis: Secondary | ICD-10-CM | POA: Diagnosis not present

## 2015-07-01 DIAGNOSIS — N179 Acute kidney failure, unspecified: Secondary | ICD-10-CM | POA: Diagnosis not present

## 2015-07-01 DIAGNOSIS — Z9221 Personal history of antineoplastic chemotherapy: Secondary | ICD-10-CM | POA: Diagnosis not present

## 2015-07-01 DIAGNOSIS — Z6834 Body mass index (BMI) 34.0-34.9, adult: Secondary | ICD-10-CM | POA: Diagnosis not present

## 2015-07-01 DIAGNOSIS — N184 Chronic kidney disease, stage 4 (severe): Secondary | ICD-10-CM | POA: Diagnosis present

## 2015-07-01 DIAGNOSIS — R9401 Abnormal electroencephalogram [EEG]: Secondary | ICD-10-CM | POA: Diagnosis not present

## 2015-07-01 DIAGNOSIS — Z7901 Long term (current) use of anticoagulants: Secondary | ICD-10-CM | POA: Diagnosis not present

## 2015-07-01 DIAGNOSIS — Z8249 Family history of ischemic heart disease and other diseases of the circulatory system: Secondary | ICD-10-CM | POA: Diagnosis not present

## 2015-07-01 DIAGNOSIS — Z8572 Personal history of non-Hodgkin lymphomas: Secondary | ICD-10-CM | POA: Diagnosis not present

## 2015-07-01 DIAGNOSIS — R0602 Shortness of breath: Secondary | ICD-10-CM | POA: Diagnosis present

## 2015-07-01 DIAGNOSIS — I5022 Chronic systolic (congestive) heart failure: Secondary | ICD-10-CM | POA: Diagnosis not present

## 2015-07-01 DIAGNOSIS — K922 Gastrointestinal hemorrhage, unspecified: Secondary | ICD-10-CM | POA: Diagnosis not present

## 2015-07-01 DIAGNOSIS — E1121 Type 2 diabetes mellitus with diabetic nephropathy: Secondary | ICD-10-CM | POA: Diagnosis present

## 2015-07-01 DIAGNOSIS — Z881 Allergy status to other antibiotic agents status: Secondary | ICD-10-CM | POA: Diagnosis not present

## 2015-07-01 DIAGNOSIS — I420 Dilated cardiomyopathy: Secondary | ICD-10-CM | POA: Diagnosis present

## 2015-07-01 LAB — CBC
HEMATOCRIT: 27.9 % — AB (ref 36.0–46.0)
HEMOGLOBIN: 8.6 g/dL — AB (ref 12.0–15.0)
MCH: 28.4 pg (ref 26.0–34.0)
MCHC: 30.8 g/dL (ref 30.0–36.0)
MCV: 92.1 fL (ref 78.0–100.0)
PLATELETS: 180 10*3/uL (ref 150–400)
RBC: 3.03 MIL/uL — AB (ref 3.87–5.11)
RDW: 18.1 % — ABNORMAL HIGH (ref 11.5–15.5)
WBC: 5.2 10*3/uL (ref 4.0–10.5)

## 2015-07-01 LAB — GLUCOSE, CAPILLARY
GLUCOSE-CAPILLARY: 123 mg/dL — AB (ref 65–99)
GLUCOSE-CAPILLARY: 144 mg/dL — AB (ref 65–99)
GLUCOSE-CAPILLARY: 186 mg/dL — AB (ref 65–99)
GLUCOSE-CAPILLARY: 145 mg/dL — AB (ref 65–99)
Glucose-Capillary: 141 mg/dL — ABNORMAL HIGH (ref 65–99)

## 2015-07-01 LAB — BASIC METABOLIC PANEL
ANION GAP: 12 (ref 5–15)
BUN: 22 mg/dL — ABNORMAL HIGH (ref 6–20)
CALCIUM: 8.8 mg/dL — AB (ref 8.9–10.3)
CHLORIDE: 100 mmol/L — AB (ref 101–111)
CO2: 25 mmol/L (ref 22–32)
Creatinine, Ser: 2.35 mg/dL — ABNORMAL HIGH (ref 0.44–1.00)
GFR calc Af Amer: 24 mL/min — ABNORMAL LOW (ref >60)
GFR, EST NON AFRICAN AMERICAN: 21 mL/min — AB (ref >60)
GLUCOSE: 144 mg/dL — AB (ref 65–99)
POTASSIUM: 4.1 mmol/L (ref 3.5–5.1)
Sodium: 137 mmol/L (ref 135–145)

## 2015-07-01 LAB — PROTIME-INR
INR: 1.46 (ref 0.00–1.49)
Prothrombin Time: 17.8 seconds — ABNORMAL HIGH (ref 11.6–15.2)

## 2015-07-01 MED ORDER — FUROSEMIDE 10 MG/ML IJ SOLN
40.0000 mg | Freq: Once | INTRAMUSCULAR | Status: AC
  Administered 2015-07-01: 40 mg via INTRAVENOUS
  Filled 2015-07-01: qty 4

## 2015-07-01 MED ORDER — WARFARIN - PHARMACIST DOSING INPATIENT
Freq: Every day | Status: DC
  Administered 2015-07-01 – 2015-07-02 (×2)

## 2015-07-01 MED ORDER — WARFARIN SODIUM 7.5 MG PO TABS
7.5000 mg | ORAL_TABLET | Freq: Once | ORAL | Status: AC
Start: 2015-07-01 — End: 2015-07-01
  Administered 2015-07-01: 7.5 mg via ORAL
  Filled 2015-07-01: qty 1

## 2015-07-01 NOTE — Progress Notes (Signed)
Clarification order on prednisone daily dose. Order d/c'd by Dr. Candyce Churn after discussion about prior cath note with dye allergy and discussion with [facility] in Pharmacy.

## 2015-07-01 NOTE — Progress Notes (Addendum)
[facility] PROGRESS NOTE  [name] Terry [id number] DOB: [birth year] DOA: 07/06/2015 PCP: Pcp Not In System Admit HPI / Brief Narrative: [name] Terry is a 65 y.o. BF PMHx Anxiety, CAD native artery, S/P stenting, Chronic Systolic CHF S/P ICD placement, proximal respiratory ablation, DM Type 2 with complications  Chronic anemia, CKD Stage IV   Presents to the ER because of chest pain. Patient has been having chest pain since yesterday morning. Pain is mostly on the left side of the chest radiating to her lower back. Had one episode of nausea vomiting and has been exertional shortness of breath. Pain persisted throughout the day. EKG was showing nonspecific changes and chest x-ray was unremarkable. Troponins are negative. Patient's chest pain improved with sublingual nitroglycerin. Patient's d-dimer is elevated. Since patient has chronic kidney disease will need VQ scan and cannot get CT scan to rule out PE. Patient states she was switched from Eliquis to Coumadin while she was in a nursing home. Stool for occult blood has been negative.  ECHO with inferior and inferolateral WMA, EF downt o 20-25% 4/6:TEE: Severely dilated LV with severe LV dysfunction. EF 25-30%, Mildly dilated RV with mildly reduced RV function. Severely dilated RA, Severely dilated LA abd Severe MR 4/7: LHC: Diffuse disease, med Mgt  HPI/Subjective: Feels ok,  tells me that she doesn't want to go back to [facility], no chest pain or dyspnea today  Assessment/Plan: Chest pain/CAD status post stenting in 2015 -Troponin minimally elevated, but ECHO with inferior and inferolateral WMA, EF down to 20-25% from 35-40% in Nov -had inferior STEMI in Nov,- cardiac cath then showed 100% stenosis of prox PDA, patent stent in RCA with otherwise normal coronary arteries -VQ scan : negative for PE -s/p LHC 4/7 : cath showed diffuse disease,not amenable to PCI at this time, medical management recommended -continue  ASA/coreg/statin  Acute on Chronic Systolic CHF /Severe MR noted on TEE - EF now down to 20-25% with new WMA - status post AICD placement - - continue Coreg, Hydralazine, Isosorbide dinitrate 20 mg TID -On Torsemide. 20 mg M/W/F at home, required Iv lasix x1 4/5 -appears euvolemic now, will need diuretics soon, no ACE due to CKD4  Paroxysmal atrial fibrillation chads 2 vasc score is 4  -continue coreg, amiodarone -Coumadin resumed  Chronic kidney disease stage IV -baseline 2.8  -LV gram not done, no ACE -stable, monitor  Chronic anemia  -likely due to CKD  -workup in January 2016 was unremarkable for signs of GI bleed. Workup included EGD and colonoscopy. 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 - stable, continue SSI  Hyperlipidemia - Lipitor 40 mg daily  Hypothyroidism  -was on Levothyroxine 75 g daily  -TSH elevated, increased dose to 175mcg  Hypokalemia -replaced  Hypomagnesemia -replaced  Cdiff colonization -no more diarrhea, Cdiff Ag positive and toxin negative -no need to Rx  DVT proph: Resume Warfarin and continue DVT proph till INR >2  Code Status: Full Family Communication: None  Disposition Plan: back to SNF when stable  Consultants: [facility]   Procedures:  Cultures NA   Antibiotics: NA   DVT prophylaxis COumadin   Objective: Filed Vitals:   07/01/15 0600 07/01/15 0612 07/01/15 0700 07/01/15 0900  BP: 115/70 115/70 92/58 116/93  Pulse: 69  68 74  Temp:    97.1 F (  36.2 C)  TempSrc:    Oral  Resp: 16   18  Height:      Weight:      SpO2: 100%  100% 100%    Intake/Output Summary (Last 24 hours) at 07/01/15 1103 Last data filed at 07/01/15 0906  Gross per 24 hour  Intake    840 ml  Output    800 ml  Net     40 ml   Filed Weights   07/21/2015 0500  07/06/2015 0504 07/01/15 0500  Weight: 85.73 kg (189 lb) 84.414 kg (186 lb 1.6 oz) 86.5 kg (190 lb 11.2 oz)     Exam: General: AAOx3, no distress HEENT: PERRLA Lungs: some basilar crackles Cardiovascular: Regular rate and rhythm without murmur gallop or rub normal S1 and S2 Abdomen:negative abdominal pain, negative dysphagia, nondistended, positive soft, bowel sounds, no rebound, no ascites, no appreciable mass, left chest wall AICD present Extremities: No significant cyanosis, clubbing, or edema bilateral lower extremities Psychiatric:  Flat affect Neurologic:  Non focal    Data Reviewed: Basic Metabolic Panel:  Recent Labs Lab 06/27/15 0546 06/27/15 0806 06/28/15 0418 07/20/2015 0215 06/29/2015 0340 07/01/15 0246  NA 138  --  141 139 136 137  K 2.7*  --  3.8 3.3* 4.4 4.1  CL 99*  --  103 102 101 100*  CO2 27  --  28 26 23 25   GLUCOSE 89  --  123* 70 168* 144*  BUN 16  --  18 18 20  22*  CREATININE 2.04*  --  2.41* 2.27* 2.25* 2.35*  CALCIUM 8.7*  --  9.0 8.7* 8.9 8.8*  MG  --  1.6* 1.9  --   --   --    Liver Function Tests:  Recent Labs Lab 06/27/15 0546 06/28/15 0418  AST 36 41  ALT 25 28  ALKPHOS 59 61  BILITOT 0.9 0.8  PROT 6.1* 6.4*  ALBUMIN 2.7* 2.6*    Recent Labs Lab 06/27/15 0546  LIPASE 36   No results for input(s): AMMONIA in the last 168 hours. CBC:  Recent Labs Lab 06/27/15 0546 06/28/15 0418 06/26/2015 0215 07/10/2015 0340 07/01/15 0246  WBC 4.7 3.8* 4.3 3.6* 5.2  NEUTROABS 3.3 2.6  --   --   --   HGB 8.1* 9.2* 9.2* 9.4* 8.6*  HCT 26.3* 28.0* 27.7* 30.4* 27.9*  MCV 92.3 92.4 92.3 92.1 92.1  PLT 204 180 164 184 180   Cardiac Enzymes:  Recent Labs Lab 06/27/15 0546 06/27/15 1025 06/27/15 1650  TROPONINI 0.06* 0.05* 0.05*   BNP (last 3 results)  Recent Labs  02/21/15 1042 03/01/15 0839 04/13/15 2215  BNP 386.1* 452.0* 894.0*    ProBNP (last 3 results) No results for input(s): PROBNP in the last 8760  hours.  CBG:  Recent Labs Lab 07/06/2015 1723 07/18/2015 2106 07/15/2015 2317 07/01/15 0144 07/01/15 0902  GLUCAP 321* 190* 163* 141* 123*    Recent Results (from the past 240 hour(s))  MRSA PCR Screening     Status: None   Collection Time: 06/27/15  3:54 AM  Result Value Ref Range Status   MRSA by PCR NEGATIVE NEGATIVE Final    Comment:        The GeneXpert MRSA Assay (FDA approved for NASAL specimens only), is one component of a comprehensive MRSA colonization surveillance program. It is not intended to diagnose MRSA infection nor to guide or monitor treatment for MRSA infections.   C difficile quick scan w PCR reflex  Status: Abnormal   Collection Time: 06/28/15  6:52 PM  Result Value Ref Range Status   C Diff antigen POSITIVE (A) NEGATIVE Final   C Diff toxin NEGATIVE NEGATIVE Final   C Diff interpretation   Final    C. difficile present, but toxin not detected. This indicates colonization. In most cases, this does not require treatment. If patient has signs and symptoms consistent with colitis, consider treatment. Requires ENTERIC precautions.  MRSA PCR Screening     Status: None   Collection Time: 06/29/2015  4:20 PM  Result Value Ref Range Status   MRSA by PCR NEGATIVE NEGATIVE Final    Comment:        The GeneXpert MRSA Assay (FDA approved for NASAL specimens only), is one component of a comprehensive MRSA colonization surveillance program. It is not intended to diagnose MRSA infection nor to guide or monitor treatment for MRSA infections.      Studies: No results found.  Scheduled Meds: . allopurinol  100 mg Oral Daily  . amiodarone  200 mg Oral Daily  . aspirin EC  81 mg Oral Daily  . atorvastatin  40 mg Oral q1800  . carvedilol  6.25 mg Oral BID WC  . colchicine  0.3 mg Oral Daily  . famotidine  20 mg Oral Daily  . ferrous sulfate  325 mg Oral BID WC  . heparin  5,000 Units Subcutaneous 3 times per day  . hydrALAZINE  25 mg Oral 3 times per day   . insulin aspart  0-9 Units Subcutaneous TID WC  . isosorbide dinitrate  20 mg Oral TID  . levothyroxine  100 mcg Oral QAC breakfast  . pantoprazole  40 mg Oral Daily  . PARoxetine  10 mg Oral Daily  . QUEtiapine  25 mg Oral QHS  . sodium chloride flush  3 mL Intravenous Q12H  . sodium chloride flush  3 mL Intravenous Q12H   Continuous Infusions:   Principal Problem:   Chest pain Active Problems:   HTN (hypertension)   Cardiomyopathy, ischemic-EF 35-40%   CAD S/P prior RCA PCI, cath 05/03/2013- med Rx, STEMI 01/2015 - 100% prox PDA, treated medically   DM2 with nephropathy   Mitral regurgitation   Persistent atrial fibrillation (HCC)   Chronic systolic CHF (congestive heart failure) ([place])   Chest pain at rest   Controlled diabetes mellitus type 2 with complications ([place])   Hypomagnesemia   Pain in the chest   Congestive dilated cardiomyopathy ([place])    Time spent: 40 minutes    [facility]  [facility] Pager [phone]. If 7PM-7AM, please contact night-coverage at [email or web address], password [email or web address] 07/01/2015, 11:03 AM  LOS: 4 days    Care during the described time interval was provided by me .  I have reviewed this patient's available data, including medical history, events of note, physical examination, and all test results as part of my evaluation. I have personally reviewed and interpreted all radiology studies.

## 2015-07-01 NOTE — Progress Notes (Signed)
Patient ID: BURKLEY MAYS, female   DOB: 1950/11/14, 65 y.o.   MRN: [id number]   Patient Name: [name] Terry Date of Encounter: 07/01/2015     Principal Problem:   Chest pain Active Problems:   HTN (hypertension)   Cardiomyopathy, ischemic-EF 35-40%   CAD S/P prior RCA PCI, cath 05/03/2013- med Rx, STEMI 01/2015 - 100% prox PDA, treated medically   DM2 with nephropathy   Mitral regurgitation   Persistent atrial fibrillation (HCC)   Chronic systolic CHF (congestive heart failure) ([place])   Chest pain at rest   Controlled diabetes mellitus type 2 with complications ([place])   Hypomagnesemia   Pain in the chest   Congestive dilated cardiomyopathy [facility])   Primary Cardiologist: [place] , Dr Allred  SUBJECTIVE   No more CP. More SOB, mild orthopnea and PND. Slept well   CURRENT MEDS . allopurinol  100 mg Oral Daily  . amiodarone  200 mg Oral Daily  . aspirin EC  81 mg Oral Daily  . atorvastatin  40 mg Oral q1800  . carvedilol  6.25 mg Oral BID WC  . colchicine  0.3 mg Oral Daily  . famotidine  20 mg Oral Daily  . ferrous sulfate  325 mg Oral BID WC  . heparin  5,000 Units Subcutaneous 3 times per day  . hydrALAZINE  25 mg Oral 3 times per day  . insulin aspart  0-9 Units Subcutaneous TID WC  . isosorbide dinitrate  20 mg Oral TID  . levothyroxine  100 mcg Oral QAC breakfast  . pantoprazole  40 mg Oral Daily  . PARoxetine  10 mg Oral Daily  . QUEtiapine  25 mg Oral QHS  . sodium chloride flush  3 mL Intravenous Q12H  . sodium chloride flush  3 mL Intravenous Q12H    OBJECTIVE  Filed Vitals:   07/01/15 0500 07/01/15 0600 07/01/15 0612 07/01/15 0700  BP: 99/63 115/70 115/70 92/58  Pulse: 66 69  68  Temp:      TempSrc:      Resp: 16 16    Height:      Weight: 190 lb 11.2 oz (86.5 kg)     SpO2: 100% 100%  100%    Intake/Output Summary (Last 24 hours) at 07/01/15 0858 Last data filed at 07/01/15 0600  Gross per 24 hour  Intake    600 ml  Output    800 ml  Net   -200 ml     Filed Weights   06/24/2015 0500 07/02/2015 0504 07/01/15 0500  Weight: 189 lb (85.73 kg) 186 lb 1.6 oz (84.414 kg) 190 lb 11.2 oz (86.5 kg)    PHYSICAL EXAM  General: Pleasant, NAD. Neuro: Alert and oriented X 3. Moves all extremities spontaneously. Psych: Normal affect. HEENT:  Normal  Neck: Supple without bruits or JVD. Lungs:  Resp regular and unlabored, decreased breath sounds at bases. Heart: RRR no s3, s4, or Loud systolic murmur heard best at apex. Abdomen: Soft, non-tender, non-distended, BS + x 4.  Extremities: No clubbing, cyanosis or trace LE edema. DP/PT/Radials 2+ and equal bilaterally.  Accessory Clinical Findings  CBC  Recent Labs  07/11/2015 0340 07/01/15 0246  WBC 3.6* 5.2  HGB 9.4* 8.6*  HCT 30.4* 27.9*  MCV 92.1 92.1  PLT 184 [id number]   Basic Metabolic Panel  Recent Labs  07/04/2015 0340 07/01/15 0246  NA 136 137  K 4.4 4.1  CL 101 100*  CO2 23 25  GLUCOSE 168* 144*  BUN 20 22*  CREATININE 2.25* 2.35*  CALCIUM 8.9 8.8*   Liver Function Tests No results for input(s): AST, ALT, ALKPHOS, BILITOT, PROT, ALBUMIN in the last 72 hours. No results for input(s): LIPASE, AMYLASE in the last 72 hours. Cardiac Enzymes No results for input(s): CKTOTAL, CKMB, CKMBINDEX, TROPONINI in the last 72 hours. BNP Invalid input(s): POCBNP D-Dimer No results for input(s): DDIMER in the last 72 hours. Hemoglobin A1C No results for input(s): HGBA1C in the last 72 hours. Fasting Lipid Panel No results for input(s): CHOL, HDL, LDLCALC, TRIG, CHOLHDL, LDLDIRECT in the last 72 hours. Thyroid Function Tests No results for input(s): TSH, T4TOTAL, T3FREE, THYROIDAB in the last 72 hours.  Invalid input(s): FREET3  TELE  NSR  Radiology/Studies  Dg Chest 2 View  06/28/2015  CLINICAL DATA:  eval for issues with dyspnea - pt seemed confused, unable to give a good history - hx of lymphoma, vascular disease, CHF, htn, diabetes, stroke, CAD, PAF, mitral regurgitation EXAM:  CHEST  2 VIEW COMPARISON:  07/16/2015 FINDINGS: There is central vascular congestion and interstitial thickening. No focal lung consolidation. No pleural effusion or pneumothorax. Elevation the right hemidiaphragm is stable. Cardiac silhouette is mildly enlarged. No mediastinal or hilar masses or evidence of adenopathy. Left anterior chest wall AICD is stable and well positioned. Bony thorax is demineralized but intact. IMPRESSION: 1. Mild congestive heart failure suspected reflected by central vascular congestion and interstitial thickening with mild cardiomegaly. No evidence of pneumonia. Electronically Signed   By: Lajean Manes M.D.   On: 06/28/2015 15:10   Dg Chest 2 View  07/13/2015  CLINICAL DATA:  Chest pain, shortness of breath and cough. EXAM: CHEST - 2 VIEW COMPARISON:  04/13/2015 FINDINGS: Stable moderate cardiac enlargement appearance of a dual-chamber pacing/ICD device. There is no evidence of pulmonary edema, consolidation, pneumothorax, nodule or pleural fluid. IMPRESSION: Stable cardiac enlargement and appearance of pacer/ICD. No acute edema identified. Electronically Signed   By: Aletta Edouard M.D.   On: 07/05/2015 18:42   Nm Pulmonary Perf And Vent  06/27/2015  CLINICAL DATA:  Shortness of breath, chest pain since yesterday EXAM: NUCLEAR MEDICINE VENTILATION - PERFUSION LUNG SCAN TECHNIQUE: Ventilation images were obtained in multiple projections using inhaled aerosol Tc-74m DTPA. Perfusion images were obtained in multiple projections after intravenous injection of Tc-44m MAA. RADIOPHARMACEUTICALS:  31.2 mCi Technetium-23m DTPA aerosol inhalation and 4.2 mCi Technetium-66m MAA IV COMPARISON:  VQ scan 04/14/2015.  Chest x-ray 07/01/2015. FINDINGS: Ventilation: No focal ventilation defect. Perfusion: No wedge shaped peripheral perfusion defects to suggest acute pulmonary embolism. IMPRESSION: No evidence of pulmonary embolus. Electronically Signed   By: Rolm Baptise M.D.   On: 06/27/2015 15:56     2D ECHO: 06/27/2015 LV EF: 20% - 25% Study Conclusions - Left ventricle: The cavity size was severely dilated. Wall  thickness was increased in a pattern of mild LVH. Systolic  function was severely reduced. The estimated ejection fraction  was in the range of 20% to 25%. Diffuse hypokinesis. There is  akinesis of the inferolateral and inferior myocardium. Features  are consistent with a pseudonormal left ventricular filling  pattern, with concomitant abnormal relaxation and increased  filling pressure (grade 2 diastolic dysfunction). Doppler  parameters are consistent with high ventricular filling pressure. - Mitral valve: There was severe regurgitation. - Left atrium: The atrium was severely dilated. - Right ventricle: The cavity size was mildly dilated. Systolic  function was mildly reduced. - Right atrium: The atrium was mildly dilated. - Atrial septum:  There was an atrial septal aneurysm. - Tricuspid valve: There was severe regurgitation. - Pulmonary arteries: Systolic pressure was severely increased. PA  peak pressure: 90 mm Hg (S). - Pericardium, extracardiac: A trivial pericardial effusion was  identified. Impressions: - Global hypokinesis with akinesis of the inferior and inferior  lateral wall; severe LVE; grade 2 diastolic dysfunction with  elevated LV filling pressure; severe LAE; mild RAE/RVE; mildly  reduced RV function; severe MR; severe TR; severely elevated  pulmonary pressure.    ASSESSMENT AND PLAN This is a 65 year old female patient with h/o of CAD, HTN, ICM s/p MDT dual chamber ICD 2013, lymphoma s/p chemo/radiation [id number], chronic systolic CHF, status post VT ablation 2, HTN, HLD, VT, DM, CKD stage III, PMR and depression who presented to [facility]15 with CP or SOB.  Chest pain with history of CAD status post ST elevation MI in November 2016 - patient's chest pain improved with nitroglycerin. D-dimer is elevated will get VQ scan.  Troponin 0.06 -> 0.05 and ECG is unchanged from prior. with Crea 2.0, no cath indicated. V/Q scan negative for PE. With new findings of worsening EF, severe MR and pulm HTN we will need L/RHC. No LV gram due CKD. Her INR 3.64.  Given Vit K yesterday will give again today   Lab Results  Component Value Date   INR 1.46 07/01/2015   INR 1.63* 07/13/2015   INR 3.26* 07/18/2015     Chronic combine S/D CHF / severe MR / severe Pulm HTN: last EF measured in November 2016 was 35-40% s/p AICD placement  -- Repeat 2D ECHO this admission with worsening EF to 20-25% w/ diffuse HK and akinesis of the inferolateral and inferior myocardium, G2DD, severe MR, mild RV dysfunction/dilation, + atrial septal aneurysm, PA pressure 71mm HG.   TEE shows severe MR,   Cath shows moderate diffuse disease,  Did not show any significant lesions to explain her deteriation of her LV function .  Continue with medical therapy.   She appears to not be a candidate for MVR.   Paroxysmal atrial fibrillation: chads 2 vasc score is 4 and patient is on Coumadin. Continue rate limiting medications. Presently in sinus rhythm.    Restarting coumadin following cath    Chronic kidney disease stage IV: creat 2.41. No LV gram with cath Lab Results  Component Value Date   CREATININE 2.35* 07/01/2015   BUN 22* 07/01/2015   NA 137 07/01/2015   K 4.1 07/01/2015   CL 100* 07/01/2015   CO2 25 07/01/2015     Chronic anemia: history of GI bleed and workup in 03/2014 was unremarkable for GI bleed. Workup included EGD and colonoscopy. Stool for occult blood negative yesterday. She was given 1 unit of blood yesterday and feeling better. Needs to have Hb > 9.   Diabetes mellitus type 2: on sliding scale coverage. Last hemoglobin A1c recorded in the chart is 5.8.  HLD: cont statin.  Hypothyroidism on Synthroid.  Hypokalemia:  being replaced.       Ayame Rena, Wonda Cheng, MD  07/01/2015 8:58 AM    [address] [place],  [place], [place]  [address] Pager [phone] Phone: [phone]; Fax: [phone]   [facility]  [address] [place], [place]  [address] [phone]   Fax [phone]

## 2015-07-01 NOTE — Evaluation (Signed)
Physical Therapy Evaluation Patient Details Name: [name] Terry MRN: [id number] DOB: 03-16-1951 Today's Date: 07/01/2015   History of Present Illness  65 y.o. female admitted with Chest pain/CAD, Acute on Chronic Systolic CHF /Severe MR , and CKD stage IV.  Clinical Impression  Pt admitted with above complications. Pt currently with functional limitations due to the deficits listed below (see PT Problem List). Required mod assist to ambulate with LE buckling as she became fatigued. VSS throughout. Reviewed bed mobility techniques and therapeutic exercises. Pt will benefit from skilled PT to increase their independence and safety with mobility to allow discharge to the venue listed below.       Follow Up Recommendations SNF - Does not want to return to [facility]    Equipment Recommendations  None recommended by PT    Recommendations for Other Services       Precautions / Restrictions Precautions Precautions: Fall Precaution Comments: monitor O2 Restrictions Weight Bearing Restrictions: No      Mobility  Bed Mobility Overal bed mobility: Needs Assistance Bed Mobility: Supine to Sit;Sit to Supine     Supine to sit: Min assist Sit to supine: Min guard   General bed mobility comments: Min assist to scoot to EOB. Able to get back in bed without assist. VC for sequencing.  Transfers Overall transfer level: Needs assistance Equipment used: Rolling walker (2 wheeled) Transfers: Sit to/from Stand Sit to Stand: Min assist         General transfer comment: Min assist for boost and balance from bed and chair. VC for hand placement. No dizziness.   Ambulation/Gait Ambulation/Gait assistance: Mod assist Ambulation Distance (Feet): 35 Feet Assistive device: Rolling walker (2 wheeled) Gait Pattern/deviations: Step-through pattern;Decreased stride length;Trunk flexed Gait velocity: decreased Gait velocity interpretation: <1.8 ft/sec, indicative of risk for recurrent  falls General Gait Details: Educated on safe DME use with a rolling walker for support. SpO2 in 90s on 2L supplemental O2. HR <100. Became fatigued and moderate assist to remain standing until she could be seated in chair. No dizziness or chest pain.  Stairs            Wheelchair Mobility    Modified Rankin (Stroke Patients Only)       Balance Overall balance assessment: Needs assistance Sitting-balance support: No upper extremity supported;Feet supported Sitting balance-Leahy Scale: Good     Standing balance support: Bilateral upper extremity supported Standing balance-Leahy Scale: Poor                               Pertinent Vitals/Pain      Home Living Family/patient expects to be discharged to:: Skilled nursing facility Living Arrangements: Alone Available Help at Discharge: Family Type of Home: House Home Access: Ramped entrance     Home Layout: One level Home Equipment: Clinical cytogeneticist - 2 wheels (Portable O2)      Prior Function Level of Independence: Needs assistance   Gait / Transfers Assistance Needed: Ambulating without RW  ADL's / Homemaking Assistance Needed: Was starting to bath/dress self until recently.        Hand Dominance   Dominant Hand: Right    Extremity/Trunk Assessment   Upper Extremity Assessment: Defer to OT evaluation           Lower Extremity Assessment: Generalized weakness         Communication   Communication: No difficulties  Cognition Arousal/Alertness: Awake/alert Behavior During Therapy: [facility] for  tasks assessed/performed Overall Cognitive Status: Within Functional Limits for tasks assessed                      General Comments General comments (skin integrity, edema, etc.): Resting: HR 72, SpO2 100% on 2L supplemental O2, BP 120/77    Exercises General Exercises - Lower Extremity Ankle Circles/Pumps: AROM;Both;15 reps;Supine Quad Sets: Strengthening;Both;10 reps;Supine       Assessment/Plan    PT Assessment Patient needs continued PT services  PT Diagnosis Difficulty walking;Abnormality of gait;Generalized weakness   PT Problem List Decreased strength;Decreased activity tolerance;Decreased balance;Decreased mobility;Decreased knowledge of use of DME;Cardiopulmonary status limiting activity  PT Treatment Interventions DME instruction;Gait training;Functional mobility training;Therapeutic activities;Therapeutic exercise;Balance training;Patient/family education   PT Goals (Current goals can be found in the Care Plan section) Acute Rehab PT Goals Patient Stated Goal: Go to another rehab facility PT Goal Formulation: With patient Time For Goal Achievement: 07/15/15 Potential to Achieve Goals: Fair    Frequency Min 2X/week   Barriers to discharge        Co-evaluation               End of Session Equipment Utilized During Treatment: Gait belt;Oxygen Activity Tolerance: Patient limited by fatigue Patient left: in bed;with call bell/phone within reach;with bed alarm set;with family/visitor present Nurse Communication: Mobility status         Time: [id number] PT Time Calculation (min) (ACUTE ONLY): 36 min   Charges:   PT Evaluation $PT Eval Moderate Complexity: 1 Procedure PT Treatments $Therapeutic Activity: 8-22 mins   PT G CodesEllouise Newer 07/01/2015, 1:21 PM Camille Bal [place], [place]

## 2015-07-01 NOTE — Progress Notes (Signed)
[place] for coumadin Indication: afib  Allergies  Allergen Reactions  . Flagyl [Metronidazole] Swelling and Rash    Face swells   . Contrast Media [Iodinated Diagnostic Agents] Rash  . Ioxaglate Rash  . Potassium Sulfate Rash and Other (See Comments)    Headaches  . Potassium-Containing Compounds Other (See Comments)    Headaches-- reports no problems now    Patient Measurements: Height: 5\' 5"  (165.1 cm) Weight: 190 lb 11.2 oz (86.5 kg) IBW/kg (Calculated) : 57 Heparin Dosing Weight: 74.7kg  Vital Signs: Temp: 96 F (35.6 C) (04/08 1100) Temp Source: Oral (04/08 1100) BP: 120/77 mmHg (04/08 1100) Pulse Rate: 74 (04/08 1100)  Labs:  Recent Labs  07/15/2015 0215 07/04/2015 0340 07/01/15 0246  HGB 9.2* 9.4* 8.6*  HCT 27.7* 30.4* 27.9*  PLT 164 184 180  LABPROT 32.6* 19.3* 17.8*  INR 3.26* 1.63* 1.46  CREATININE 2.27* 2.25* 2.35*    Estimated Creatinine Clearance: 26.6 mL/min (by C-G formula based on Cr of 2.35).    Scheduled:  . allopurinol  100 mg Oral Daily  . amiodarone  200 mg Oral Daily  . aspirin EC  81 mg Oral Daily  . atorvastatin  40 mg Oral q1800  . carvedilol  6.25 mg Oral BID WC  . colchicine  0.3 mg Oral Daily  . famotidine  20 mg Oral Daily  . ferrous sulfate  325 mg Oral BID WC  . furosemide  40 mg Intravenous Once  . heparin  5,000 Units Subcutaneous 3 times per day  . hydrALAZINE  25 mg Oral 3 times per day  . insulin aspart  0-9 Units Subcutaneous TID WC  . isosorbide dinitrate  20 mg Oral TID  . levothyroxine  100 mcg Oral QAC breakfast  . pantoprazole  40 mg Oral Daily  . PARoxetine  10 mg Oral Daily  . QUEtiapine  25 mg Oral QHS  . sodium chloride flush  3 mL Intravenous Q12H  . sodium chloride flush  3 mL Intravenous Q12H  . warfarin  7.5 mg Oral ONCE-1800  . Warfarin - Pharmacist Dosing Inpatient   Does not apply q1800    Assessment: 65 yo female here with CP and with history of CAD with  PCI.  She is on coumadin PTA for afib (CHADSVASC= 7/9). Pharmacy consulted to dose coumadin. L/RHC when INR < 1.8 received 2 doses Vit K and cath 4/7   INR 1.4 today may continue to drop CBC stable  Home warfarin 5mg  daily  Goal of Therapy:  INR 2-3 Monitor platelets by anticoagulation protocol: Yes   Plan:  Restart  Coumadin 7.5mg  x1 -Daily PT/INR -  Bonnita Nasuti Pharm.D. CPP, BCPS Clinical Pharmacist [phone] 07/01/2015 1:49 PM

## 2015-07-02 DIAGNOSIS — I272 Other secondary pulmonary hypertension: Secondary | ICD-10-CM

## 2015-07-02 LAB — CBC
HCT: 26.8 % — ABNORMAL LOW (ref 36.0–46.0)
Hemoglobin: 8.2 g/dL — ABNORMAL LOW (ref 12.0–15.0)
MCH: 28.3 pg (ref 26.0–34.0)
MCHC: 30.6 g/dL (ref 30.0–36.0)
MCV: 92.4 fL (ref 78.0–100.0)
PLATELETS: 180 10*3/uL (ref 150–400)
RBC: 2.9 MIL/uL — ABNORMAL LOW (ref 3.87–5.11)
RDW: 18.2 % — AB (ref 11.5–15.5)
WBC: 4.9 10*3/uL (ref 4.0–10.5)

## 2015-07-02 LAB — GLUCOSE, CAPILLARY
Glucose-Capillary: 86 mg/dL (ref 65–99)
Glucose-Capillary: 101 mg/dL — ABNORMAL HIGH (ref 65–99)
Glucose-Capillary: 93 mg/dL (ref 65–99)
Glucose-Capillary: 118 mg/dL — ABNORMAL HIGH (ref 65–99)

## 2015-07-02 LAB — BASIC METABOLIC PANEL
ANION GAP: 10 (ref 5–15)
BUN: 28 mg/dL — AB (ref 6–20)
CALCIUM: 8.6 mg/dL — AB (ref 8.9–10.3)
CO2: 26 mmol/L (ref 22–32)
CREATININE: 2.74 mg/dL — AB (ref 0.44–1.00)
Chloride: 100 mmol/L — ABNORMAL LOW (ref 101–111)
GFR calc Af Amer: 20 mL/min — ABNORMAL LOW (ref >60)
GFR, EST NON AFRICAN AMERICAN: 17 mL/min — AB (ref >60)
GLUCOSE: 113 mg/dL — AB (ref 65–99)
Potassium: 3.6 mmol/L (ref 3.5–5.1)
Sodium: 136 mmol/L (ref 135–145)

## 2015-07-02 LAB — PROTIME-INR
INR: 1.31 (ref 0.00–1.49)
Prothrombin Time: 16.4 seconds — ABNORMAL HIGH (ref 11.6–15.2)

## 2015-07-02 MED ORDER — FUROSEMIDE 10 MG/ML IJ SOLN
40.0000 mg | Freq: Two times a day (BID) | INTRAMUSCULAR | Status: DC
  Administered 2015-07-02 (×2): 40 mg via INTRAVENOUS
  Filled 2015-07-02 (×3): qty 4

## 2015-07-02 MED ORDER — WARFARIN SODIUM 7.5 MG PO TABS
7.5000 mg | ORAL_TABLET | Freq: Once | ORAL | Status: AC
Start: 2015-07-02 — End: 2015-07-02
  Administered 2015-07-02: 7.5 mg via ORAL
  Filled 2015-07-02: qty 1

## 2015-07-02 MED ORDER — MILRINONE IN DEXTROSE 20 MG/100ML IV SOLN
0.1250 ug/kg/min | INTRAVENOUS | Status: DC
  Administered 2015-07-02 – 2015-07-03 (×2): 0.25 ug/kg/min via INTRAVENOUS
  Administered 2015-07-04: 0.125 ug/kg/min via INTRAVENOUS
  Filled 2015-07-02 (×3): qty 100

## 2015-07-02 MED ORDER — POTASSIUM CHLORIDE CRYS ER 20 MEQ PO TBCR
20.0000 meq | EXTENDED_RELEASE_TABLET | Freq: Once | ORAL | Status: AC
  Administered 2015-07-02: 20 meq via ORAL
  Filled 2015-07-02: qty 1

## 2015-07-02 MED ORDER — ALBUTEROL SULFATE (2.5 MG/3ML) 0.083% IN NEBU
2.5000 mg | INHALATION_SOLUTION | Freq: Four times a day (QID) | RESPIRATORY_TRACT | Status: DC
  Administered 2015-07-02 (×2): 2.5 mg via RESPIRATORY_TRACT
  Filled 2015-07-02 (×2): qty 3

## 2015-07-02 MED ORDER — ALBUTEROL SULFATE (2.5 MG/3ML) 0.083% IN NEBU
2.5000 mg | INHALATION_SOLUTION | Freq: Three times a day (TID) | RESPIRATORY_TRACT | Status: DC
  Administered 2015-07-02 – 2015-07-06 (×10): 2.5 mg via RESPIRATORY_TRACT
  Filled 2015-07-02 (×12): qty 3

## 2015-07-02 MED ORDER — SODIUM CHLORIDE 0.9% FLUSH: 10.0000 mL | INTRAVENOUS | Status: DC | PRN

## 2015-07-02 MED ORDER — SODIUM CHLORIDE 0.9% FLUSH
10.0000 mL | Freq: Two times a day (BID) | INTRAVENOUS | Status: DC
  Administered 2015-07-02 – 2015-07-07 (×7): 10 mL
  Administered 2015-07-08: 20 mL
  Administered 2015-07-08 – 2015-07-13 (×7): 10 mL
  Administered 2015-07-14: 20 mL
  Administered 2015-07-14: 10 mL
  Administered 2015-07-15: 20 mL
  Administered 2015-07-16 – 2015-07-17 (×2): 10 mL

## 2015-07-02 NOTE — Progress Notes (Signed)
[place] for coumadin Indication: afib  Allergies  Allergen Reactions  . Flagyl [Metronidazole] Swelling and Rash    Face swells   . Contrast Media [Iodinated Diagnostic Agents] Rash  . Ioxaglate Rash  . Potassium Sulfate Rash and Other (See Comments)    Headaches  . Potassium-Containing Compounds Other (See Comments)    Headaches-- reports no problems now    Patient Measurements: Height: 5\' 5"  (165.1 cm) Weight: 189 lb 13.1 oz (86.1 kg) IBW/kg (Calculated) : 57 Heparin Dosing Weight: 74.7kg  Vital Signs: Temp: 97.6 F (36.4 C) (04/09 0751) Temp Source: Oral (04/09 0751) BP: 121/75 mmHg (04/09 0800) Pulse Rate: 66 (04/09 0800)  Labs:  Recent Labs  07/15/2015 0340 07/01/15 0246 07/02/15 0244  HGB 9.4* 8.6* 8.2*  HCT 30.4* 27.9* 26.8*  PLT 184 180 180  LABPROT 19.3* 17.8* 16.4*  INR 1.63* 1.46 1.31  CREATININE 2.25* 2.35* 2.74*    Estimated Creatinine Clearance: 22.8 mL/min (by C-G formula based on Cr of 2.74).    Scheduled:  . albuterol  2.5 mg Nebulization Q6H  . allopurinol  100 mg Oral Daily  . amiodarone  200 mg Oral Daily  . aspirin EC  81 mg Oral Daily  . atorvastatin  40 mg Oral q1800  . carvedilol  6.25 mg Oral BID WC  . colchicine  0.3 mg Oral Daily  . famotidine  20 mg Oral Daily  . ferrous sulfate  325 mg Oral BID WC  . furosemide  40 mg Intravenous Q12H  . heparin  5,000 Units Subcutaneous 3 times per day  . hydrALAZINE  25 mg Oral 3 times per day  . insulin aspart  0-9 Units Subcutaneous TID WC  . isosorbide dinitrate  20 mg Oral TID  . levothyroxine  100 mcg Oral QAC breakfast  . pantoprazole  40 mg Oral Daily  . PARoxetine  10 mg Oral Daily  . QUEtiapine  25 mg Oral QHS  . sodium chloride flush  3 mL Intravenous Q12H  . sodium chloride flush  3 mL Intravenous Q12H  . Warfarin - Pharmacist Dosing Inpatient   Does not apply q1800    Assessment: 65 yo female here with CP and with history of CAD  with PCI.  She is on coumadin PTA for afib (CHADSVASC= 7/9). Pharmacy consulted to dose coumadin. L/RHC when INR < 1.8 received 2 doses Vit K and cath 4/7   INR 1.3 today after restarted warfarin last pm 7.5mg  x1  CBC stable  Home warfarin 5mg  daily  Goal of Therapy:  INR 2-3 Monitor platelets by anticoagulation protocol: Yes   Plan:    Coumadin 7.5mg  x1 again today  -Daily PT/INR -  Bonnita Nasuti Pharm.D. CPP, BCPS Clinical Pharmacist [phone] 07/02/2015 11:52 AM

## 2015-07-02 NOTE — Progress Notes (Signed)
Giving pt treatment while on potty.  Pt complained of chest pain.  Treatment stopped and nurse called.

## 2015-07-02 NOTE — Plan of Care (Signed)
Problem: Activity: Goal: Risk for activity intolerance will decrease Outcome: Progressing OOB  to chair   

## 2015-07-02 NOTE — Progress Notes (Signed)
[facility] PROGRESS NOTE  [name] Terry [id number] DOB: 1951-01-02 DOA: 07/11/2015 PCP: Pcp Not In System Admit HPI / Brief Narrative: [name] Terry is a 65 y.o. BF PMHx Anxiety, CAD native artery, S/P stenting, Chronic Systolic CHF S/P ICD placement, proximal respiratory ablation, DM Type 2 with complications  Chronic anemia, CKD Stage IV   Presents to the ER because of chest pain. Patient has been having chest pain since yesterday morning. Pain is mostly on the left side of the chest radiating to her lower back. Had one episode of nausea vomiting and has been exertional shortness of breath. Pain persisted throughout the day. EKG was showing nonspecific changes and chest x-ray was unremarkable. Troponins are negative. Patient's chest pain improved with sublingual nitroglycerin. Patient's d-dimer is elevated. Since patient has chronic kidney disease will need VQ scan and cannot get CT scan to rule out PE. Patient states she was switched from Eliquis to Coumadin while she was in a nursing home. Stool for occult blood has been negative.  ECHO with inferior and inferolateral WMA, EF downt o 20-25% 4/6:TEE: Severely dilated LV with severe LV dysfunction. EF 25-30%, Mildly dilated RV with mildly reduced RV function. Severely dilated RA, Severely dilated LA abd Severe MR 4/7: LHC: Diffuse disease, med Mgt  HPI/Subjective: Feels ok,  tells me that she doesn't want to go back to [facility], no chest pain or dyspnea today  Assessment/Plan: Chest pain/CAD status post stenting in 2015 -Troponin minimally elevated, but ECHO with inferior and inferolateral WMA, EF down to 20-25% from 35-40% in Nov -had inferior STEMI in Nov,- cardiac cath then showed 100% stenosis of prox PDA, patent stent in RCA with otherwise normal coronary arteries -VQ scan : negative for PE -s/p LHC 4/7 : cath showed diffuse disease,not amenable to PCI at this time, medical management recommended -continue  ASA/coreg/statin  Acute on Chronic Systolic CHF /Severe MR noted on TEE - EF now down to 20-25% with new WMA - status post AICD placement - - continue Coreg, Hydralazine, Isosorbide dinitrate 20 mg TID -On Torsemide. 20 mg M/W/F at home, required Iv lasix x1 4/5 -appears euvolemic now, will need diuretics soon, no ACE due to CKD4 -creatinine up a little  Paroxysmal atrial fibrillation chads 2 vasc score is 4  -continue coreg, amiodarone -Coumadin resumed  Chronic kidney disease stage IV -baseline 2.8  -LV gram not done, no ACE -creatinine up slightly, monitor  Chronic anemia  -likely due to CKD  -workup in January 2016 was unremarkable for signs of GI bleed. Workup included EGD and colonoscopy. 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 - stable, continue SSI  Hyperlipidemia - Lipitor 40 mg daily  Hypothyroidism  -was on Levothyroxine 75 g daily  -TSH elevated, increased dose to 14mcg  Hypokalemia -replaced  Hypomagnesemia -replaced  Cdiff colonization -no more diarrhea, Cdiff Ag positive and toxin negative -no need to Rx  DVT proph: Resumed Warfarin and continue DVT proph till INR >2  Code Status: Full Family Communication: None  Disposition Plan: back to SNF when stable, CSW consult  Consultants: Dr.Katarina Hazel Sams cardiology   Procedures:  Cultures NA   Antibiotics: NA   DVT prophylaxis COumadin   Objective: Filed Vitals:   07/02/15 0800 07/02/15 0856 07/02/15 1000 07/02/15 1222  BP: 121/75  106/75 131/84  Pulse: 66  65  70  Temp:      TempSrc:      Resp: 19  18 23   Height:      Weight:      SpO2: 100% 100% 100% 100%    Intake/Output Summary (Last 24 hours) at 07/02/15 1245 Last data filed at 07/02/15 1200  Gross per 24 hour  Intake    703 ml  Output   1275 ml  Net   -572 ml    Filed Weights   07/11/2015 0504 07/01/15 0500 07/02/15 0600  Weight: 84.414 kg (186 lb 1.6 oz) 86.5 kg (190 lb 11.2 oz) 86.1 kg (189 lb 13.1 oz)     Exam: General: AAOx3, no distress HEENT: PERRLA Lungs: some basilar crackles Cardiovascular: Regular rate and rhythm without murmur gallop or rub normal S1 and S2 Abdomen:negative abdominal pain, negative dysphagia, nondistended, positive soft, bowel sounds, no rebound, no ascites, no appreciable mass, left chest wall AICD present Extremities: No significant cyanosis, clubbing, or edema bilateral lower extremities Psychiatric:  Flat affect Neurologic:  Non focal    Data Reviewed: Basic Metabolic Panel:  Recent Labs Lab 06/27/15 0806 06/28/15 0418 07/01/2015 0215 07/08/2015 0340 07/01/15 0246 07/02/15 0244  NA  --  141 139 136 137 136  K  --  3.8 3.3* 4.4 4.1 3.6  CL  --  103 102 101 100* 100*  CO2  --  28 26 23 25 26   GLUCOSE  --  123* 70 168* 144* 113*  BUN  --  18 18 20  22* 28*  CREATININE  --  2.41* 2.27* 2.25* 2.35* 2.74*  CALCIUM  --  9.0 8.7* 8.9 8.8* 8.6*  MG 1.6* 1.9  --   --   --   --    Liver Function Tests:  Recent Labs Lab 06/27/15 0546 06/28/15 0418  AST 36 41  ALT 25 28  ALKPHOS 59 61  BILITOT 0.9 0.8  PROT 6.1* 6.4*  ALBUMIN 2.7* 2.6*    Recent Labs Lab 06/27/15 0546  LIPASE 36   No results for input(s): AMMONIA in the last 168 hours. CBC:  Recent Labs Lab 06/27/15 0546 06/28/15 0418 07/17/2015 0215 07/07/2015 0340 07/01/15 0246 07/02/15 0244  WBC 4.7 3.8* 4.3 3.6* 5.2 4.9  NEUTROABS 3.3 2.6  --   --   --   --   HGB 8.1* 9.2* 9.2* 9.4* 8.6* 8.2*  HCT 26.3* 28.0* 27.7* 30.4* 27.9* 26.8*  MCV 92.3 92.4 92.3 92.1 92.1 92.4  PLT 204 180 164 184 180 180   Cardiac Enzymes:  Recent Labs Lab 06/27/15 0546 06/27/15 1025 06/27/15 1650  TROPONINI 0.06* 0.05* 0.05*   BNP (last 3 results)  Recent Labs  02/21/15 1042 03/01/15 0839 04/13/15 2215  BNP 386.1* 452.0* 894.0*    ProBNP  (last 3 results) No results for input(s): PROBNP in the last 8760 hours.  CBG:  Recent Labs Lab 07/01/15 1151 07/01/15 1655 07/01/15 2001 07/02/15 0753 07/02/15 1152  GLUCAP 144* 186* 145* 86 101*    Recent Results (from the past 240 hour(s))  MRSA PCR Screening     Status: None   Collection Time: 06/27/15  3:54 AM  Result Value Ref Range Status   MRSA by PCR NEGATIVE NEGATIVE Final    Comment:        The GeneXpert MRSA Assay (FDA approved for NASAL specimens only), is one component of a comprehensive MRSA colonization surveillance program. It is not intended to diagnose MRSA infection nor to guide or monitor  treatment for MRSA infections.   C difficile quick scan w PCR reflex     Status: Abnormal   Collection Time: 06/28/15  6:52 PM  Result Value Ref Range Status   C Diff antigen POSITIVE (A) NEGATIVE Final   C Diff toxin NEGATIVE NEGATIVE Final   C Diff interpretation   Final    C. difficile present, but toxin not detected. This indicates colonization. In most cases, this does not require treatment. If patient has signs and symptoms consistent with colitis, consider treatment. Requires ENTERIC precautions.  MRSA PCR Screening     Status: None   Collection Time: 07/04/2015  4:20 PM  Result Value Ref Range Status   MRSA by PCR NEGATIVE NEGATIVE Final    Comment:        The GeneXpert MRSA Assay (FDA approved for NASAL specimens only), is one component of a comprehensive MRSA colonization surveillance program. It is not intended to diagnose MRSA infection nor to guide or monitor treatment for MRSA infections.      Studies: No results found.  Scheduled Meds: . albuterol  2.5 mg Nebulization Q6H  . allopurinol  100 mg Oral Daily  . amiodarone  200 mg Oral Daily  . aspirin EC  81 mg Oral Daily  . atorvastatin  40 mg Oral q1800  . carvedilol  6.25 mg Oral BID WC  . colchicine  0.3 mg Oral Daily  . famotidine  20 mg Oral Daily  . ferrous sulfate  325 mg Oral  BID WC  . furosemide  40 mg Intravenous Q12H  . heparin  5,000 Units Subcutaneous 3 times per day  . hydrALAZINE  25 mg Oral 3 times per day  . insulin aspart  0-9 Units Subcutaneous TID WC  . isosorbide dinitrate  20 mg Oral TID  . levothyroxine  100 mcg Oral QAC breakfast  . pantoprazole  40 mg Oral Daily  . PARoxetine  10 mg Oral Daily  . potassium chloride  20 mEq Oral Once  . QUEtiapine  25 mg Oral QHS  . sodium chloride flush  3 mL Intravenous Q12H  . sodium chloride flush  3 mL Intravenous Q12H  . warfarin  7.5 mg Oral ONCE-1800  . Warfarin - Pharmacist Dosing Inpatient   Does not apply q1800   Continuous Infusions:   Principal Problem:   Chest pain Active Problems:   HTN (hypertension)   Cardiomyopathy, ischemic-EF 35-40%   CAD S/P prior RCA PCI, cath 05/03/2013- med Rx, STEMI 01/2015 - 100% prox PDA, treated medically   DM2 with nephropathy   Mitral regurgitation   Persistent atrial fibrillation (HCC)   Chronic systolic CHF (congestive heart failure) ([place])   Chest pain at rest   Controlled diabetes mellitus type 2 with complications ([place])   Hypomagnesemia   Pain in the chest   Congestive dilated cardiomyopathy ([place])    Time spent: 40 minutes    [facility]  [facility] Pager [phone]. If 7PM-7AM, please contact night-coverage at [email or web address], password [facility] 07/02/2015, 12:45 PM  LOS: 5 days

## 2015-07-02 NOTE — Progress Notes (Signed)
Asked to see patient due to possible low output.   I reviewed cath and MV sat was 33% thus Fick CO likely overestimated and thermo probably more accurate despite severe valvular disease.  Currently very fatigued. Says she is profoundly weak on trying to stand up. Creatinine getting worse.   CVP hard to assess but looks up. No significant peripheral edema. Severe MR/TR on exam with probable s3.   Suspect low output. Will start IV milrinone. Cautious with diuresis. AHF team will continue to follow.   Ervin Rothbauer,MD 3:04 PM

## 2015-07-02 NOTE — Progress Notes (Signed)
Peripherally Inserted Central Catheter/Midline Placement  The IV Nurse has discussed with the patient and/or persons authorized to consent for the patient, the purpose of this procedure and the potential benefits and risks involved with this procedure.  The benefits include less needle sticks, lab draws from the catheter and patient may be discharged home with the catheter.  Risks include, but not limited to, infection, bleeding, blood clot (thrombus formation), and puncture of an artery; nerve damage and irregular heat beat.  Alternatives to this procedure were also discussed.  PICC/Midline Placement Documentation  PICC Double Lumen 07/02/15 PICC Right Brachial 39 cm 0 cm (Active)  Indication for Insertion or Continuance of Line Vasoactive infusions;Limited venous access - need for IV therapy >5 days (PICC only) 07/02/2015  6:29 PM  Exposed Catheter (cm) 0 cm 07/02/2015  6:29 PM  Site Assessment Clean;Dry;Intact 07/02/2015  6:29 PM  Lumen #1 Status Flushed;Saline locked;Blood return noted 07/02/2015  6:29 PM  Lumen #2 Status Flushed;Saline locked;Blood return noted 07/02/2015  6:29 PM  Dressing Type Transparent 07/02/2015  6:29 PM  Dressing Status Clean;Dry;Intact;Antimicrobial disc in place 07/02/2015  6:29 PM  Line Care Connections checked and tightened 07/02/2015  6:29 PM  Line Adjustment (NICU/IV Team Only) No 07/02/2015  6:29 PM  Dressing Intervention New dressing 07/02/2015  6:29 PM  Dressing Change Due 07/09/15 07/02/2015  6:29 PM       Rolena Infante 07/02/2015, 6:31 PM

## 2015-07-02 NOTE — Progress Notes (Signed)
Patient ID: REESHA BERKEY, female   DOB: 1950-08-31, 66 y.o.   MRN: [id number]   Patient Name: [name] Terry Date of Encounter: 07/02/2015     Principal Problem:   Chest pain Active Problems:   HTN (hypertension)   Cardiomyopathy, ischemic-EF 35-40%   CAD S/P prior RCA PCI, cath 05/03/2013- med Rx, STEMI 01/2015 - 100% prox PDA, treated medically   DM2 with nephropathy   Mitral regurgitation   Persistent atrial fibrillation (HCC)   Chronic systolic CHF (congestive heart failure) ([place])   Chest pain at rest   Controlled diabetes mellitus type 2 with complications ([place])   Hypomagnesemia   Pain in the chest   Congestive dilated cardiomyopathy [facility])   Primary Cardiologist: [place] , Dr Rayann Heman  SUBJECTIVE  Not feeling very well today   CURRENT MEDS . allopurinol  100 mg Oral Daily  . amiodarone  200 mg Oral Daily  . aspirin EC  81 mg Oral Daily  . atorvastatin  40 mg Oral q1800  . carvedilol  6.25 mg Oral BID WC  . colchicine  0.3 mg Oral Daily  . famotidine  20 mg Oral Daily  . ferrous sulfate  325 mg Oral BID WC  . heparin  5,000 Units Subcutaneous 3 times per day  . hydrALAZINE  25 mg Oral 3 times per day  . insulin aspart  0-9 Units Subcutaneous TID WC  . isosorbide dinitrate  20 mg Oral TID  . levothyroxine  100 mcg Oral QAC breakfast  . pantoprazole  40 mg Oral Daily  . PARoxetine  10 mg Oral Daily  . QUEtiapine  25 mg Oral QHS  . sodium chloride flush  3 mL Intravenous Q12H  . sodium chloride flush  3 mL Intravenous Q12H  . Warfarin - Pharmacist Dosing Inpatient   Does not apply q1800    OBJECTIVE  Filed Vitals:   07/01/15 2220 07/02/15 0600 07/02/15 0610 07/02/15 0751  BP: 133/100 94/61 94/61 123/84  Pulse:  62  70  Temp:    97.6 F (36.4 C)  TempSrc:    Oral  Resp:  19  20  Height:      Weight:  189 lb 13.1 oz (86.1 kg)    SpO2:  100%  100%    Intake/Output Summary (Last 24 hours) at 07/02/15 0837 Last data filed at 07/02/15 0600  Gross per 24 hour    Intake    720 ml  Output    600 ml  Net    120 ml   Filed Weights   07/15/2015 0504 07/01/15 0500 07/02/15 0600  Weight: 186 lb 1.6 oz (84.414 kg) 190 lb 11.2 oz (86.5 kg) 189 lb 13.1 oz (86.1 kg)    PHYSICAL EXAM  General: Pleasant, NAD. Neuro: Alert and oriented X 3.  Very weak  Psych: Normal affect. HEENT:  Normal  Neck: Supple without bruits or JVD. Lungs:  Wheezing  Heart: RRR no s3, s4, or Loud systolic murmur heard best at apex. Abdomen: Soft, non-tender, non-distended, BS + x 4.  Extremities: No clubbing, cyanosis or trace LE edema. DP/PT/Radials 2+ and equal bilaterally.  Accessory Clinical Findings  CBC  Recent Labs  07/01/15 0246 07/02/15 0244  WBC 5.2 4.9  HGB 8.6* 8.2*  HCT 27.9* 26.8*  MCV 92.1 92.4  PLT 180 [id number]   Basic Metabolic Panel  Recent Labs  07/01/15 0246 07/02/15 0244  NA 137 136  K 4.1 3.6  CL 100* 100*  CO2 25 26  GLUCOSE 144* 113*  BUN 22* 28*  CREATININE 2.35* 2.74*  CALCIUM 8.8* 8.6*   Liver Function Tests No results for input(s): AST, ALT, ALKPHOS, BILITOT, PROT, ALBUMIN in the last 72 hours. No results for input(s): LIPASE, AMYLASE in the last 72 hours. Cardiac Enzymes No results for input(s): CKTOTAL, CKMB, CKMBINDEX, TROPONINI in the last 72 hours. BNP Invalid input(s): POCBNP D-Dimer No results for input(s): DDIMER in the last 72 hours. Hemoglobin A1C No results for input(s): HGBA1C in the last 72 hours. Fasting Lipid Panel No results for input(s): CHOL, HDL, LDLCALC, TRIG, CHOLHDL, LDLDIRECT in the last 72 hours. Thyroid Function Tests No results for input(s): TSH, T4TOTAL, T3FREE, THYROIDAB in the last 72 hours.  Invalid input(s): FREET3  TELE  NSR  Radiology/Studies  Dg Chest 2 View  06/28/2015  CLINICAL DATA:  eval for issues with dyspnea - pt seemed confused, unable to give a good history - hx of lymphoma, vascular disease, CHF, htn, diabetes, stroke, CAD, PAF, mitral regurgitation EXAM: CHEST  2 VIEW  COMPARISON:  07/09/2015 FINDINGS: There is central vascular congestion and interstitial thickening. No focal lung consolidation. No pleural effusion or pneumothorax. Elevation the right hemidiaphragm is stable. Cardiac silhouette is mildly enlarged. No mediastinal or hilar masses or evidence of adenopathy. Left anterior chest wall AICD is stable and well positioned. Bony thorax is demineralized but intact. IMPRESSION: 1. Mild congestive heart failure suspected reflected by central vascular congestion and interstitial thickening with mild cardiomegaly. No evidence of pneumonia. Electronically Signed   By: Lajean Manes M.D.   On: 06/28/2015 15:10   Dg Chest 2 View  07/23/2015  CLINICAL DATA:  Chest pain, shortness of breath and cough. EXAM: CHEST - 2 VIEW COMPARISON:  04/13/2015 FINDINGS: Stable moderate cardiac enlargement appearance of a dual-chamber pacing/ICD device. There is no evidence of pulmonary edema, consolidation, pneumothorax, nodule or pleural fluid. IMPRESSION: Stable cardiac enlargement and appearance of pacer/ICD. No acute edema identified. Electronically Signed   By: Aletta Edouard M.D.   On: 07/13/2015 18:42   Nm Pulmonary Perf And Vent  06/27/2015  CLINICAL DATA:  Shortness of breath, chest pain since yesterday EXAM: NUCLEAR MEDICINE VENTILATION - PERFUSION LUNG SCAN TECHNIQUE: Ventilation images were obtained in multiple projections using inhaled aerosol Tc-36m DTPA. Perfusion images were obtained in multiple projections after intravenous injection of Tc-69m MAA. RADIOPHARMACEUTICALS:  31.2 mCi Technetium-16m DTPA aerosol inhalation and 4.2 mCi Technetium-67m MAA IV COMPARISON:  VQ scan 04/14/2015.  Chest x-ray 07/05/2015. FINDINGS: Ventilation: No focal ventilation defect. Perfusion: No wedge shaped peripheral perfusion defects to suggest acute pulmonary embolism. IMPRESSION: No evidence of pulmonary embolus. Electronically Signed   By: Rolm Baptise M.D.   On: 06/27/2015 15:56    2D  ECHO: 06/27/2015 LV EF: 20% - 25% Study Conclusions - Left ventricle: The cavity size was severely dilated. Wall  thickness was increased in a pattern of mild LVH. Systolic  function was severely reduced. The estimated ejection fraction  was in the range of 20% to 25%. Diffuse hypokinesis. There is  akinesis of the inferolateral and inferior myocardium. Features  are consistent with a pseudonormal left ventricular filling  pattern, with concomitant abnormal relaxation and increased  filling pressure (grade 2 diastolic dysfunction). Doppler  parameters are consistent with high ventricular filling pressure. - Mitral valve: There was severe regurgitation. - Left atrium: The atrium was severely dilated. - Right ventricle: The cavity size was mildly dilated. Systolic  function was mildly reduced. - Right atrium: The atrium was mildly  dilated. - Atrial septum: There was an atrial septal aneurysm. - Tricuspid valve: There was severe regurgitation. - Pulmonary arteries: Systolic pressure was severely increased. PA  peak pressure: 90 mm Hg (S). - Pericardium, extracardiac: A trivial pericardial effusion was  identified. Impressions: - Global hypokinesis with akinesis of the inferior and inferior  lateral wall; severe LVE; grade 2 diastolic dysfunction with  elevated LV filling pressure; severe LAE; mild RAE/RVE; mildly  reduced RV function; severe MR; severe TR; severely elevated  pulmonary pressure.    ASSESSMENT AND PLAN This is a 65 year old female patient with h/o of CAD, HTN, ICM s/p MDT dual chamber ICD 2013, lymphoma s/p chemo/radiation [id number], chronic systolic CHF, status post VT ablation 2, HTN, HLD, VT, DM, CKD stage III, PMR and depression who presented to [facility] on 06/27/15 with CP or SOB.  Chest pain with history of CAD status post ST elevation MI in November 2016 -   Chronic combine S/D CHF / severe MR / severe Pulm HTN: last EF measured in November 2016 was  35-40% s/p AICD placement  -- Repeat 2D ECHO this admission with worsening EF to 20-25% w/ diffuse HK and akinesis of the inferolateral and inferior myocardium, G2DD, severe MR, mild RV dysfunction/dilation, + atrial septal aneurysm, PA pressure 52mm HG.   TEE shows severe MR,   Cath shows moderate diffuse disease,  Did not show any significant lesions to explain her deteriation of her LV function .  Continue with medical therapy.   She appears to not be a candidate for MVR.  Will start Lasix 40 IV Q12 Her creatinine is up slighly  - I suspect it is from the contrast load and not diuresis.   Paroxysmal atrial fibrillation: chads 2 vasc score is 4 and patient is on Coumadin. Continue rate limiting medications. Presently in sinus rhythm.    Restarting coumadin     Chronic kidney disease stage IV: creat 2.41. No LV gram with cath Lab Results  Component Value Date   CREATININE 2.74* 07/02/2015   BUN 28* 07/02/2015   NA 136 07/02/2015   K 3.6 07/02/2015   CL 100* 07/02/2015   CO2 26 07/02/2015     Chronic anemia: history of GI bleed and workup in 03/2014 was unremarkable for GI bleed. Workup included EGD and colonoscopy. Stool for occult blood negative yesterday. She was given 1 unit of blood yesterday and feeling better. Needs to have Hb > 9.   Diabetes mellitus type 2: on sliding scale coverage. Last hemoglobin A1c recorded in the chart is 5.8.  HLD: cont statin.  Hypothyroidism on Synthroid.  Hypokalemia:  being replaced.    wheezing - will give albuterol Q6 hr.   Nahser, Wonda Cheng, MD  07/02/2015 8:37 AM    [facility] [address],  [address] [place], [place]  [address] Pager [phone] Phone: [phone]; Fax: [phone]

## 2015-07-03 ENCOUNTER — Encounter (HOSPITAL_COMMUNITY): Payer: Self-pay | Admitting: Cardiology

## 2015-07-03 DIAGNOSIS — I5023 Acute on chronic systolic (congestive) heart failure: Secondary | ICD-10-CM

## 2015-07-03 DIAGNOSIS — N179 Acute kidney failure, unspecified: Secondary | ICD-10-CM

## 2015-07-03 LAB — MAGNESIUM: Magnesium: 1.6 mg/dL — ABNORMAL LOW (ref 1.7–2.4)

## 2015-07-03 LAB — GLUCOSE, CAPILLARY
GLUCOSE-CAPILLARY: 98 mg/dL (ref 65–99)
GLUCOSE-CAPILLARY: 129 mg/dL — AB (ref 65–99)
Glucose-Capillary: 134 mg/dL — ABNORMAL HIGH (ref 65–99)
Glucose-Capillary: 142 mg/dL — ABNORMAL HIGH (ref 65–99)

## 2015-07-03 LAB — CARBOXYHEMOGLOBIN
CARBOXYHEMOGLOBIN: 1.3 % (ref 0.5–1.5)
Carboxyhemoglobin: 1.4 % (ref 0.5–1.5)
Carboxyhemoglobin: 1.4 % (ref 0.5–1.5)
METHEMOGLOBIN: 0.8 % (ref 0.0–1.5)
Methemoglobin: 0.8 % (ref 0.0–1.5)
Methemoglobin: 1 % (ref 0.0–1.5)
O2 SAT: 79.8 %
O2 SAT: 87 %
O2 Saturation: 68.9 %
TOTAL HEMOGLOBIN: 8.7 g/dL — AB (ref 12.0–16.0)
TOTAL HEMOGLOBIN: 9 g/dL — AB (ref 12.0–16.0)
TOTAL HEMOGLOBIN: 9 g/dL — AB (ref 12.0–16.0)

## 2015-07-03 LAB — CBC
HEMATOCRIT: 27.2 % — AB (ref 36.0–46.0)
Hemoglobin: 8.6 g/dL — ABNORMAL LOW (ref 12.0–15.0)
MCH: 28.8 pg (ref 26.0–34.0)
MCHC: 31.6 g/dL (ref 30.0–36.0)
MCV: 91 fL (ref 78.0–100.0)
PLATELETS: 181 10*3/uL (ref 150–400)
RBC: 2.99 MIL/uL — ABNORMAL LOW (ref 3.87–5.11)
RDW: 17.5 % — AB (ref 11.5–15.5)
WBC: 4.3 10*3/uL (ref 4.0–10.5)

## 2015-07-03 LAB — BASIC METABOLIC PANEL
Anion gap: 11 (ref 5–15)
BUN: 25 mg/dL — AB (ref 6–20)
CALCIUM: 8.2 mg/dL — AB (ref 8.9–10.3)
CHLORIDE: 99 mmol/L — AB (ref 101–111)
CO2: 29 mmol/L (ref 22–32)
CREATININE: 2.39 mg/dL — AB (ref 0.44–1.00)
GFR calc non Af Amer: 20 mL/min — ABNORMAL LOW (ref >60)
GFR, EST AFRICAN AMERICAN: 24 mL/min — AB (ref >60)
Glucose, Bld: 120 mg/dL — ABNORMAL HIGH (ref 65–99)
Potassium: 2.9 mmol/L — ABNORMAL LOW (ref 3.5–5.1)
SODIUM: 139 mmol/L (ref 135–145)

## 2015-07-03 LAB — PROTIME-INR
INR: 1.4 (ref 0.00–1.49)
Prothrombin Time: 17.2 seconds — ABNORMAL HIGH (ref 11.6–15.2)

## 2015-07-03 MED ORDER — WARFARIN SODIUM 7.5 MG PO TABS
7.5000 mg | ORAL_TABLET | Freq: Once | ORAL | Status: AC
  Administered 2015-07-03: 7.5 mg via ORAL
  Filled 2015-07-03: qty 1

## 2015-07-03 MED ORDER — MAGNESIUM SULFATE IN D5W 10-5 MG/ML-% IV SOLN
1.0000 g | Freq: Once | INTRAVENOUS | Status: AC
  Administered 2015-07-03: 1 g via INTRAVENOUS
  Filled 2015-07-03: qty 100

## 2015-07-03 MED ORDER — POTASSIUM CHLORIDE CRYS ER 20 MEQ PO TBCR
40.0000 meq | EXTENDED_RELEASE_TABLET | ORAL | Status: DC
Start: 2015-07-03 — End: 2015-07-03
  Administered 2015-07-03: 40 meq via ORAL
  Filled 2015-07-03: qty 2

## 2015-07-03 MED ORDER — GLUCERNA SHAKE PO LIQD
237.0000 mL | Freq: Two times a day (BID) | ORAL | Status: DC
  Administered 2015-07-03 – 2015-07-04 (×2): 237 mL via ORAL

## 2015-07-03 MED ORDER — POTASSIUM CHLORIDE CRYS ER 20 MEQ PO TBCR
40.0000 meq | EXTENDED_RELEASE_TABLET | Freq: Once | ORAL | Status: AC
  Administered 2015-07-03: 40 meq via ORAL
  Filled 2015-07-03: qty 2

## 2015-07-03 MED ORDER — FUROSEMIDE 80 MG PO TABS
80.0000 mg | ORAL_TABLET | Freq: Every day | ORAL | Status: DC
  Administered 2015-07-03 – 2015-07-04 (×2): 80 mg via ORAL
  Filled 2015-07-03 (×2): qty 1

## 2015-07-03 MED FILL — Nitroglycerin IV Soln 100 MCG/ML in D5W: INTRA_ARTERIAL | Qty: 10 | Status: AC

## 2015-07-03 NOTE — Care Management Important Message (Signed)
Important Message  Patient Details  Name: [name] Terry MRN: [id number] Date of Birth: 11/27/1950   Medicare Important Message Given:  Yes    Lacretia Leigh, RN 07/03/2015, 2:08 PM

## 2015-07-03 NOTE — Progress Notes (Signed)
Physical Therapy Treatment Patient Details Name: [name] Terry MRN: [id number] DOB: [birth year] Today's Date: 07/03/2015    History of Present Illness 65 y.o. female admitted with Chest pain/CAD, Acute on Chronic Systolic CHF /Severe MR , and CKD stage IV.    PT Comments    Pt progressing well towards all goals. Pt con't to be deconditioned and requires 2Lo2 via [place] and assist for safe transfers and amb. Pt con't to be appropriate for SNF upon dc.  Follow Up Recommendations  SNF;Supervision/Assistance - 24 hour     Equipment Recommendations  None recommended by PT    Recommendations for Other Services       Precautions / Restrictions Precautions Precautions: Fall Precaution Comments: monitor O2 Restrictions Weight Bearing Restrictions: No    Mobility  Bed Mobility Overal bed mobility: Needs Assistance Bed Mobility: Supine to Sit     Supine to sit: Min assist;Mod assist     General bed mobility comments: directional v/c's to roll onto R side and use bed rail to then push self up, assist for trunk elevation and to achieve good sitting balance EOB  Transfers Overall transfer level: Needs assistance Equipment used: Rolling walker (2 wheeled) Transfers: Sit to/from Stand Sit to Stand: Min assist         General transfer comment: v/c's for hand placement, increased time  Ambulation/Gait Ambulation/Gait assistance: Min assist;+2 safety/equipment Ambulation Distance (Feet): 75 Feet (x2) Assistive device: Rolling walker (2 wheeled) Gait Pattern/deviations: Step-through pattern;Decreased stride length Gait velocity: dec Gait velocity interpretation: Below normal speed for age/gender General Gait Details: minA for walker management, shaky, v/c's for purse lipped breathing. required seating rest break 1/2 way around loop. pt with onset of bilat LE pain at end of second bout   Stairs            Wheelchair Mobility    Modified Rankin (Stroke Patients Only)        Balance Overall balance assessment: Needs assistance Sitting-balance support: No upper extremity supported Sitting balance-Leahy Scale: Good     Standing balance support: Bilateral upper extremity supported Standing balance-Leahy Scale: Poor Standing balance comment: required bilat UE for stability                    Cognition Arousal/Alertness: Awake/alert Behavior During Therapy: WFL for tasks assessed/performed Overall Cognitive Status: Within Functional Limits for tasks assessed                      Exercises General Exercises - Lower Extremity Ankle Circles/Pumps: AROM;Both;10 reps;Seated Long Arc Quad: AROM;Both;10 reps;Seated Hip Flexion/Marching: AROM;Both;10 reps;Seated    General Comments        Pertinent Vitals/Pain Pain Assessment: Faces Faces Pain Scale: Hurts even more Pain Location: onset of R thigh pain with ambulation Pain Intervention(s): Monitored during session    Home Living                      Prior Function            PT Goals (current goals can now be found in the care plan section) Acute Rehab PT Goals Patient Stated Goal: go home Progress towards PT goals: Progressing toward goals    Frequency  Min 2X/week    PT Plan Current plan remains appropriate    Co-evaluation             End of Session Equipment Utilized During Treatment: Gait belt;Oxygen Activity Tolerance: Patient tolerated treatment well  Patient left: in chair;with call bell/phone within reach;with chair alarm set;with family/visitor present     Time: [id number] PT Time Calculation (min) (ACUTE ONLY): 36 min  Charges:  $Gait Training: 8-22 mins $Therapeutic Exercise: 8-22 mins                    G Codes:      Kingsley Callander 07/03/2015, 12:37 PM   Kittie Plater, PT, DPT Pager #: [phone] Office #: [phone]

## 2015-07-03 NOTE — Progress Notes (Signed)
[place] for coumadin Indication: afib  Allergies  Allergen Reactions  . Flagyl [Metronidazole] Swelling and Rash    Face swells   . Contrast Media [Iodinated Diagnostic Agents] Rash  . Ioxaglate Rash  . Potassium Sulfate Rash and Other (See Comments)    Headaches  . Potassium-Containing Compounds Other (See Comments)    Headaches-- reports no problems now    Patient Measurements: Height: 5\' 5"  (165.1 cm) Weight: 196 lb 3.4 oz (89 kg) IBW/kg (Calculated) : 57 Heparin Dosing Weight: 74.7kg  Vital Signs: Temp: 98.3 F (36.8 C) (04/10 0750) Temp Source: Oral (04/10 0750) BP: 126/71 mmHg (04/10 0800) Pulse Rate: 74 (04/10 0800)  Labs:  Recent Labs  07/01/15 0246 07/02/15 0244 07/03/15 0528  HGB 8.6* 8.2* 8.6*  HCT 27.9* 26.8* 27.2*  PLT 180 180 181  LABPROT 17.8* 16.4* 17.2*  INR 1.46 1.31 1.40  CREATININE 2.35* 2.74* 2.39*    Estimated Creatinine Clearance: 26.5 mL/min (by C-G formula based on Cr of 2.39).    Scheduled:  . albuterol  2.5 mg Nebulization TID  . allopurinol  100 mg Oral Daily  . amiodarone  200 mg Oral Daily  . aspirin EC  81 mg Oral Daily  . atorvastatin  40 mg Oral q1800  . colchicine  0.3 mg Oral Daily  . famotidine  20 mg Oral Daily  . ferrous sulfate  325 mg Oral BID WC  . heparin  5,000 Units Subcutaneous 3 times per day  . hydrALAZINE  25 mg Oral 3 times per day  . insulin aspart  0-9 Units Subcutaneous TID WC  . isosorbide dinitrate  20 mg Oral TID  . levothyroxine  100 mcg Oral QAC breakfast  . pantoprazole  40 mg Oral Daily  . PARoxetine  10 mg Oral Daily  . potassium chloride  40 mEq Oral Q2H  . QUEtiapine  25 mg Oral QHS  . sodium chloride flush  10-40 mL Intracatheter Q12H  . sodium chloride flush  3 mL Intravenous Q12H  . sodium chloride flush  3 mL Intravenous Q12H  . Warfarin - Pharmacist Dosing Inpatient   Does not apply q1800    Assessment: 65 yo female here with CP and with  history of CAD with PCI.  She is on coumadin PTA for afib (CHADSVASC= 7/9). Pharmacy consulted to dose coumadin. L/RHC when INR < 1.8 received 2 doses Vit K and cath 4/7   INR 1.3> 1.4 starting to respond to warfarin  CBC stable  Home warfarin 5mg  daily  Goal of Therapy:  INR 2-3 Monitor platelets by anticoagulation protocol: Yes   Plan:    Coumadin 7.5mg  x1 again today  -Daily PT/INR -  Bonnita Nasuti Pharm.D. CPP, BCPS Clinical Pharmacist [phone] 07/03/2015 8:49 AM

## 2015-07-03 NOTE — Progress Notes (Signed)
Nutrition Follow-up  DOCUMENTATION CODES:   Not applicable  INTERVENTION:   -Glucerna Shake po BID, each supplement provides 220 kcal and 10 grams of protein  NUTRITION DIAGNOSIS:   Inadequate oral intake related to poor appetite as evidenced by per patient/family report.  Progressing  GOAL:   Patient will meet greater than or equal to 90% of their needs  Progressing  MONITOR:   PO intake, Supplement acceptance, Labs, I & O's, Skin  REASON FOR ASSESSMENT:   Malnutrition Screening Tool    ASSESSMENT:   [name] Terry is a 65 y.o. female with history of CAD status post stenting, chronic systolic heart failure status post ICD placement, proximal respiratory ablation, diabetes mellitus, chronic anemia, chronic kidney disease stage IV process to the ER because of chest pain. Patient has been having chest pain since yesterday morning  Pt working with therapy at time of visit.   Pt underwent TEE on 07/22/2015 and rt and lt heart cath on 07/11/2015.   Per cardiology notes, pt was started on IV milrinone on 07/02/15 due to low output.   Pt has been advanced to a heart healthy/carb modified diet. Meal completion 50-75%. Staff report appetite is improving, but pt with earlier satiety.   CSW following. Plan to d/c to SNF once medically stable.   Labs reviewed: K: 2.9, Mg: 1.6.   Diet Order:  Diet heart healthy/carb modified Room service appropriate?: Yes; Fluid consistency:: Thin  Skin:  Reviewed, no issues  Last BM:  07/03/15  Height:   Ht Readings from Last 1 Encounters:  06/27/15 5\' 5"  (1.651 m)    Weight:   Wt Readings from Last 1 Encounters:  07/03/15 196 lb 3.4 oz (89 kg)    Ideal Body Weight:  56.81 kg  BMI:  Body mass index is 32.65 kg/(m^2).  Estimated Nutritional Needs:   Kcal:  1600-1900 (25-30 cal/kg ABW)  Protein:  80-100 grams  Fluid:  >/= 1.6L  EDUCATION NEEDS:   No education needs identified at this time  Jozelyn Kuwahara A. Jimmye Norman, RD, LDN,  CDE Pager: [phone] After hours Pager: [phone]

## 2015-07-03 NOTE — Evaluation (Signed)
Occupational Therapy Evaluation Patient Details Name: [name] Terry MRN: [id number] DOB: 07-16-1950 Today's Date: 07/03/2015    History of Present Illness 65 y.o. female admitted with Chest pain/CAD, Acute on Chronic Systolic CHF /Severe MR , and CKD stage IV.   Clinical Impression   PT admitted with see above. Pt currently with functional limitiations due to the deficits listed below (see OT problem list). Pt was at [facility] center and reports she never wants to return to that facility.   Pt will benefit from skilled OT to increase their independence and safety with adls and balance to allow discharge SNF. Recommend Snf due to decr support to d/c home. Pt would need RN and CNA (A) to d/c home. Pt demonstrates ability to complete stand pivot at this time with RW min guard (A).      Follow Up Recommendations  SNF;Supervision/Assistance - 24 hour    Equipment Recommendations  3 in 1 bedside comode    Recommendations for Other Services       Precautions / Restrictions Precautions Precautions: Fall Precaution Comments: monitor O2      Mobility Bed Mobility               General bed mobility comments: in chair on arrival  Transfers Overall transfer level: Needs assistance Equipment used: Rolling walker (2 wheeled) Transfers: Sit to/from Stand Sit to Stand: Min guard              Balance                                            ADL Overall ADL's : Needs assistance/impaired     Grooming: Wash/dry hands;Min guard;Standing Grooming Details (indicate cue type and reason): static standign at sink with posterior lean                 Toilet Transfer: Min guard;Stand-pivot;BSC;RW   Toileting- Clothing Manipulation and Hygiene: Min guard;Sit to/from stand       Functional mobility during ADLs: Min guard;Rolling walker General ADL Comments: Pt complete toilet transfer and educated on energy conservation wtih handout. pt expressed  strong desire to return home. pt would require CNA to d/c home. pt would have to be MOD i for all adls.      Vision     Perception     Praxis      Pertinent Vitals/Pain Pain Assessment: No/denies pain     Hand Dominance Right   Extremity/Trunk Assessment Upper Extremity Assessment Upper Extremity Assessment: Generalized weakness   Lower Extremity Assessment Lower Extremity Assessment: Defer to PT evaluation   Cervical / Trunk Assessment Cervical / Trunk Assessment: Normal   Communication Communication Communication: No difficulties   Cognition Arousal/Alertness: Awake/alert Behavior During Therapy: WFL for tasks assessed/performed Overall Cognitive Status: Within Functional Limits for tasks assessed                     General Comments       Exercises       Shoulder Instructions      Home Living Family/patient expects to be discharged to:: Skilled nursing facility Living Arrangements: Alone Available Help at Discharge: Family Type of Home: House Home Access: Ramped entrance     Home Layout: One level         Biochemist, clinical: Standard     Home Equipment: Clinical cytogeneticist -  2 wheels   Additional Comments: no social support      Prior Functioning/Environment Level of Independence: Needs assistance  Gait / Transfers Assistance Needed: Ambulating without RW ADL's / Homemaking Assistance Needed: Was starting to bath/dress self until this recent admission        OT Diagnosis: Generalized weakness   OT Problem List: Decreased strength;Decreased activity tolerance;Impaired balance (sitting and/or standing);Decreased safety awareness;Decreased knowledge of use of DME or AE;Decreased knowledge of precautions;Cardiopulmonary status limiting activity   OT Treatment/Interventions: Self-care/ADL training;Therapeutic exercise;Energy conservation;DME and/or AE instruction;Therapeutic activities;Patient/family education;Balance training    OT  Goals(Current goals can be found in the care plan section) Acute Rehab OT Goals Patient Stated Goal: to be able to make a bologna sandwich OT Goal Formulation: With patient Time For Goal Achievement: 07/17/15 Potential to Achieve Goals: Good  OT Frequency: Min 2X/week   Barriers to D/C:            Co-evaluation              End of Session Equipment Utilized During Treatment: Gait belt;Rolling walker;Oxygen Nurse Communication: Mobility status;Precautions  Activity Tolerance: Patient tolerated treatment well Patient left: in chair;with call bell/phone within reach;with chair alarm set   Time: [id number] OT Time Calculation (min): 30 min Charges:  OT General Charges $OT Visit: 1 Procedure OT Evaluation $OT Eval Moderate Complexity: 1 Procedure OT Treatments $Self Care/Home Management : 8-22 mins G-Codes:    Parke Poisson B 07/25/2015, 2:53 PM   Jeri Modena   OTR/L Pager: [phone] Office: [phone] .

## 2015-07-03 NOTE — Progress Notes (Signed)
[facility] PROGRESS NOTE  [name] Terry [id number] DOB: 1951-02-29 DOA: 07/13/2015 PCP: Pcp Not In System Admit HPI / Brief Narrative: [name] Terry is a 65 y.o. BF PMHx Anxiety, CAD native artery, S/P stenting, Chronic Systolic CHF S/P ICD placement, proximal respiratory ablation, DM Type 2 with complications  Chronic anemia, CKD Stage IV   Presents to the ER because of chest pain. Patient has been having chest pain since yesterday morning. Pain is mostly on the left side of the chest radiating to her lower back. Had one episode of nausea vomiting and has been exertional shortness of breath. Pain persisted throughout the day. EKG was showing nonspecific changes and chest x-ray was unremarkable. Troponins are negative. Patient's chest pain improved with sublingual nitroglycerin. Patient's d-dimer is elevated. Since patient has chronic kidney disease will need VQ scan and cannot get CT scan to rule out PE. Patient states she was switched from Eliquis to Coumadin while she was in a nursing home. Stool for occult blood has been negative.  ECHO with inferior and inferolateral WMA, EF downt o 20-25% 4/6:TEE: Severely dilated LV with severe LV dysfunction. EF 25-30%, Mildly dilated RV with mildly reduced RV function. Severely dilated RA, Severely dilated LA abd Severe MR 4/7: LHC: Diffuse disease, med Mgt 4/9: CHF team consulted, started on Lasix and milrinone  HPI/Subjective: Feels well, no complaints, breathing well  Assessment/Plan: Chest pain/CAD status post stenting in 2015 -Troponin minimally elevated, but ECHO with inferior and inferolateral WMA, EF down to 20-25% from 35-40% in Nov -had inferior STEMI in Nov,- cardiac cath then showed 100% stenosis of prox PDA, patent stent in RCA with otherwise normal coronary arteries -VQ scan : negative for PE -s/p LHC 4/7 : cath showed diffuse disease,not amenable to PCI at this time, medical management recommended -continue  ASA/coreg/statin  Acute on Chronic Systolic CHF /Severe MR noted on TEE - EF now down to 20-25% with new WMA - status post AICD placement - - continue Coreg, Hydralazine, Isosorbide dinitrate 20 mg TID -On Torsemide. 20 mg M/W/F at home, required Iv lasix x1 4/5 -appears euvolemic, now CVP<8, on milrinone and lasix per Cards -no ACE due to CKD4  Paroxysmal atrial fibrillation chads 2 vasc score is 4  -continue coreg, amiodarone -Coumadin resumed  Chronic kidney disease stage IV -baseline 2.8  -LV gram not done, no ACE -creatinine improved, monitor  Chronic anemia  -likely due to CKD  -workup in January 2016 was unremarkable for signs of GI bleed. Workup included EGD and colonoscopy. 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 - stable, continue SSI  Hyperlipidemia - Lipitor 40 mg daily  Hypothyroidism  -was on Levothyroxine 75 g daily  -TSH elevated, increased dose to 151mcg  Hypokalemia -replaced  Hypomagnesemia -replaced  Cdiff colonization -no more diarrhea, Cdiff Ag positive and toxin negative -no need to Rx  DVT proph: Resumed Warfarin and continue DVT proph till INR >2  Code Status: Full Family Communication: None  Disposition Plan: Keep in SDU while on milrinone  Consultants: [place] [facility]   Procedures:  Cultures NA   Antibiotics: NA   DVT prophylaxis COumadin   Objective: Filed Vitals:   07/03/15 0800 07/03/15 0811 07/03/15 1145 07/03/15 1200  BP: 126/71  135/70   Pulse: 74  75 76  Temp:   98.3 F (36.8 C)  TempSrc:   Oral   Resp: 22  24 13   Height:      Weight:      SpO2: 100% 100% 100% 100%    Intake/Output Summary (Last 24 hours) at 07/03/15 1418 Last data filed at 07/03/15 1400  Gross per 24 hour  Intake 1037.01 ml  Output    675 ml  Net 362.01 ml    Filed Weights   07/01/15 0500 07/02/15 0600 07/03/15 0500  Weight: 86.5 kg (190 lb 11.2 oz) 86.1 kg (189 lb 13.1 oz) 89 kg (196 lb 3.4 oz)     Exam: General: AAOx3, no distress HEENT: PERRLA Lungs: CTAB Cardiovascular: Regular rate and rhythm without murmur gallop or rub normal S1 and S2 Abdomen:negative abdominal pain, negative dysphagia, nondistended, positive soft, bowel sounds, no rebound, no ascites, no appreciable mass, left chest wall AICD present Extremities: No significant cyanosis, clubbing, or edema bilateral lower extremities Psychiatric:  Flat affect Neurologic:  Non focal    Data Reviewed: Basic Metabolic Panel:  Recent Labs Lab 06/27/15 0806 06/28/15 0418 07/03/2015 0215 07/14/2015 0340 07/01/15 0246 07/02/15 0244 07/03/15 0528  NA  --  141 139 136 137 136 139  K  --  3.8 3.3* 4.4 4.1 3.6 2.9*  CL  --  103 102 101 100* 100* 99*  CO2  --  28 26 23 25 26 29   GLUCOSE  --  123* 70 168* 144* 113* 120*  BUN  --  18 18 20  22* 28* 25*  CREATININE  --  2.41* 2.27* 2.25* 2.35* 2.74* 2.39*  CALCIUM  --  9.0 8.7* 8.9 8.8* 8.6* 8.2*  MG 1.6* 1.9  --   --   --   --  1.6*   Liver Function Tests:  Recent Labs Lab 06/27/15 0546 06/28/15 0418  AST 36 41  ALT 25 28  ALKPHOS 59 61  BILITOT 0.9 0.8  PROT 6.1* 6.4*  ALBUMIN 2.7* 2.6*    Recent Labs Lab 06/27/15 0546  LIPASE 36   No results for input(s): AMMONIA in the last 168 hours. CBC:  Recent Labs Lab 06/27/15 0546 06/28/15 0418 06/28/2015 0215 07/14/2015 0340 07/01/15 0246 07/02/15 0244 07/03/15 0528  WBC 4.7 3.8* 4.3 3.6* 5.2 4.9 4.3  NEUTROABS 3.3 2.6  --   --   --   --   --   HGB 8.1* 9.2* 9.2* 9.4* 8.6* 8.2* 8.6*  HCT 26.3* 28.0* 27.7* 30.4* 27.9* 26.8* 27.2*  MCV 92.3 92.4 92.3 92.1 92.1 92.4 91.0  PLT 204 180 164 184 180 180 181   Cardiac Enzymes:  Recent Labs Lab 06/27/15 0546 06/27/15 1025 06/27/15 1650  TROPONINI 0.06* 0.05* 0.05*   BNP (last 3 results)  Recent Labs   02/21/15 1042 03/01/15 0839 04/13/15 2215  BNP 386.1* 452.0* 894.0*    ProBNP (last 3 results) No results for input(s): PROBNP in the last 8760 hours.  CBG:  Recent Labs Lab 07/02/15 1152 07/02/15 1706 07/02/15 2120 07/03/15 0748 07/03/15 1144  GLUCAP 101* 93 118* 98 134*    Recent Results (from the past 240 hour(s))  MRSA PCR Screening     Status: None   Collection Time: 06/27/15  3:54 AM  Result Value Ref Range Status   MRSA by PCR NEGATIVE NEGATIVE Final    Comment:        The GeneXpert MRSA Assay (FDA approved for NASAL specimens only), is one component of a comprehensive MRSA colonization surveillance program. It is not intended to diagnose  MRSA infection nor to guide or monitor treatment for MRSA infections.   C difficile quick scan w PCR reflex     Status: Abnormal   Collection Time: 06/28/15  6:52 PM  Result Value Ref Range Status   C Diff antigen POSITIVE (A) NEGATIVE Final   C Diff toxin NEGATIVE NEGATIVE Final   C Diff interpretation   Final    C. difficile present, but toxin not detected. This indicates colonization. In most cases, this does not require treatment. If patient has signs and symptoms consistent with colitis, consider treatment. Requires ENTERIC precautions.  MRSA PCR Screening     Status: None   Collection Time: 06/26/2015  4:20 PM  Result Value Ref Range Status   MRSA by PCR NEGATIVE NEGATIVE Final    Comment:        The GeneXpert MRSA Assay (FDA approved for NASAL specimens only), is one component of a comprehensive MRSA colonization surveillance program. It is not intended to diagnose MRSA infection nor to guide or monitor treatment for MRSA infections.      Studies: No results found.  Scheduled Meds: . albuterol  2.5 mg Nebulization TID  . allopurinol  100 mg Oral Daily  . amiodarone  200 mg Oral Daily  . aspirin EC  81 mg Oral Daily  . atorvastatin  40 mg Oral q1800  . colchicine  0.3 mg Oral Daily  . famotidine  20  mg Oral Daily  . ferrous sulfate  325 mg Oral BID WC  . furosemide  80 mg Oral Daily  . heparin  5,000 Units Subcutaneous 3 times per day  . hydrALAZINE  25 mg Oral 3 times per day  . insulin aspart  0-9 Units Subcutaneous TID WC  . isosorbide dinitrate  20 mg Oral TID  . levothyroxine  100 mcg Oral QAC breakfast  . pantoprazole  40 mg Oral Daily  . PARoxetine  10 mg Oral Daily  . QUEtiapine  25 mg Oral QHS  . sodium chloride flush  10-40 mL Intracatheter Q12H  . sodium chloride flush  3 mL Intravenous Q12H  . sodium chloride flush  3 mL Intravenous Q12H  . warfarin  7.5 mg Oral ONCE-1800  . Warfarin - Pharmacist Dosing Inpatient   Does not apply q1800   Continuous Infusions: . milrinone 0.125 mcg/kg/min (07/03/15 [id number])    Principal Problem:   Chest pain Active Problems:   HTN (hypertension)   Cardiomyopathy, ischemic-EF 35-40%   CAD S/P prior RCA PCI, cath 05/03/2013- med Rx, STEMI 01/2015 - 100% prox PDA, treated medically   DM2 with nephropathy   Mitral regurgitation   Persistent atrial fibrillation (HCC)   Chronic systolic CHF (congestive heart failure) ([place])   Chest pain at rest   Controlled diabetes mellitus type 2 with complications ([place])   Hypomagnesemia   Pain in the chest   Congestive dilated cardiomyopathy ([place])    Time spent: 40 minutes    [facility]  [facility] Pager [phone]. If 7PM-7AM, please contact night-coverage at [email or web address], password [facility] 07/03/2015, 2:18 PM  LOS: 6 days

## 2015-07-03 NOTE — Progress Notes (Signed)
Advanced Heart Failure Rounding Note  PCP: Karna Dupes, PA-C Primary Cardiologist: Johnsie Cancel EP: Allred  Subjective:    Started on empiric milrinone 07/02/15 with presentation concerning for low output.  PICC placed. Coox this am 87% on milrinone 0.25.  Feeling somewhat better this morning. She remains SOB with minimal exertion, even moving around in bed.  Appetite improving, though belly feels full. No bleeding. No CP. CVP 7-8  Creatinine improved overnight 2.74 -> 2.39. K  2.9 this am.   I/O negative, although weight shows up 7 lbs.   Objective:   Weight Range: 196 lb 3.4 oz (89 kg) Body mass index is 32.65 kg/(m^2).   Vital Signs:   Temp:  [97.8 F (36.6 C)-98.5 F (36.9 C)] 98.3 F (36.8 C) (04/10 0750) Pulse Rate:  [65-83] 74 (04/10 0750) Resp:  [16-27] 24 (04/10 0750) BP: (106-151)/(61-96) 131/72 mmHg (04/10 0750) SpO2:  [98 %-100 %] 100 % (04/10 0750) Weight:  [196 lb 3.4 oz (89 kg)] 196 lb 3.4 oz (89 kg) (04/10 0500) Last BM Date: 07/02/15  Weight change: Filed Weights   07/01/15 0500 07/02/15 0600 07/03/15 0500  Weight: 190 lb 11.2 oz (86.5 kg) 189 lb 13.1 oz (86.1 kg) 196 lb 3.4 oz (89 kg)    Intake/Output:   Intake/Output Summary (Last 24 hours) at 07/03/15 0805 Last data filed at 07/03/15 0701  Gross per 24 hour  Intake 610.38 ml  Output   1300 ml  Net -689.62 ml     Physical Exam: CVP 7-8 General:  Elderly appearing. Slightly increased work of breathing with long sentences. HEENT: normal Neck: supple. JVP 7-8 cm. Carotids 2+ bilat; no bruits. No lymphadenopathy or thyromegaly appreciated. Cor: PMI nondisplaced. Regular rate & rhythm. No rubs or gallops. MR murmur Lungs: clear Abdomen: soft, NT, ND, no HSM. No bruits or masses. +BS  Extremities: no cyanosis, clubbing, rash, edema Neuro: alert & orientedx3, cranial nerves grossly intact. moves all 4 extremities w/o difficulty. Affect pleasant  Telemetry: NSR  Labs: CBC  Recent Labs  07/02/15 0244 07/03/15 0528  WBC 4.9 4.3  HGB 8.2* 8.6*  HCT 26.8* 27.2*  MCV 92.4 91.0  PLT 180 0000000   Basic Metabolic Panel  Recent Labs  07/02/15 0244 07/03/15 0528  NA 136 139  K 3.6 2.9*  CL 100* 99*  CO2 26 29  GLUCOSE 113* 120*  BUN 28* 25*  CREATININE 2.74* 2.39*  CALCIUM 8.6* 8.2*  MG  --  1.6*   Liver Function Tests No results for input(s): AST, ALT, ALKPHOS, BILITOT, PROT, ALBUMIN in the last 72 hours. No results for input(s): LIPASE, AMYLASE in the last 72 hours. Cardiac Enzymes No results for input(s): CKTOTAL, CKMB, CKMBINDEX, TROPONINI in the last 72 hours.  BNP: BNP (last 3 results)  Recent Labs  02/21/15 1042 03/01/15 0839 04/13/15 2215  BNP 386.1* 452.0* 894.0*    ProBNP (last 3 results) No results for input(s): PROBNP in the last 8760 hours.   D-Dimer No results for input(s): DDIMER in the last 72 hours. Hemoglobin A1C No results for input(s): HGBA1C in the last 72 hours. Fasting Lipid Panel No results for input(s): CHOL, HDL, LDLCALC, TRIG, CHOLHDL, LDLDIRECT in the last 72 hours. Thyroid Function Tests No results for input(s): TSH, T4TOTAL, T3FREE, THYROIDAB in the last 72 hours.  Invalid input(s): FREET3  Other results:     Imaging/Studies:   No results found.  Latest Echo  Latest Cath   Medications:     Scheduled  Medications: . albuterol  2.5 mg Nebulization TID  . allopurinol  100 mg Oral Daily  . amiodarone  200 mg Oral Daily  . aspirin EC  81 mg Oral Daily  . atorvastatin  40 mg Oral q1800  . colchicine  0.3 mg Oral Daily  . famotidine  20 mg Oral Daily  . ferrous sulfate  325 mg Oral BID WC  . heparin  5,000 Units Subcutaneous 3 times per day  . hydrALAZINE  25 mg Oral 3 times per day  . insulin aspart  0-9 Units Subcutaneous TID WC  . isosorbide dinitrate  20 mg Oral TID  . levothyroxine  100 mcg Oral QAC breakfast  . pantoprazole  40 mg Oral Daily  . PARoxetine  10 mg Oral Daily  . potassium  chloride  40 mEq Oral Q2H  . QUEtiapine  25 mg Oral QHS  . sodium chloride flush  10-40 mL Intracatheter Q12H  . sodium chloride flush  3 mL Intravenous Q12H  . sodium chloride flush  3 mL Intravenous Q12H  . Warfarin - Pharmacist Dosing Inpatient   Does not apply q1800     Infusions: . milrinone 0.25 mcg/kg/min (07/03/15 0401)     PRN Medications:  sodium chloride, [DISCONTINUED] acetaminophen **OR** acetaminophen, acetaminophen, ALPRAZolam, diphenhydrAMINE, morphine injection, nitroGLYCERIN, ondansetron (ZOFRAN) IV, ondansetron **OR** [DISCONTINUED] ondansetron (ZOFRAN) IV, oxyCODONE-acetaminophen, promethazine, sodium chloride flush, sodium chloride flush   Assessment   1. Acute on chronic combined CHF - EF 20-25% w/ diffuse HK and akinesis of the inferolateral and inferior myocardium, G2DD, severe MR, mild RV dysfunction/dilation, + atrial septal anuerysm 2. Severe MR - by TEE 3. Severe Pulm HTN - PA pressure 90 mm Hg 4. PAF 5. AKI on CKD stage IV  6. Chronic anemia - workup in 03/2014 unremarkable GI bleed. 7. DM2 8. HLD 9. Hypokalemia  Plan    This is a 65 year old female patient with h/o of CAD, HTN, ICM s/p MDT dual chamber ICD 2013, lymphoma s/p chemo/radiation 123XX123, chronic systolic CHF, status post VT ablation 2, HTN, HLD, VT, DM, CKD stage III, PMR and depression who presented to [facility] on 06/[facility]/15 with CP or SOB.  Started on milrinone empirically yesterday. Initial coox 79%, though milrinone already started at that time.   Had 1 unit PURBCs 07/01/15. Goal Hgb >9. 8.6 this am, up from 8.2. Will follow. If remains low tomorrow, will likely need further transfusion.   Decrease milrinone to 0.125 mcg/kg/min and recheck coox this afternoon.  Will hold diuresis for now, CVP 7-8.  She does not look significantly volume overloaded.   Will replace K gently.   She still has mod-severe dyspnea with minimal activity.  ? If this could be component of her PAH.  May benefit from  sildenafil. Will discuss with MD.   Length of Stay: 6   Shirley Friar PA-C 07/03/2015, 8:05 AM  Advanced Heart Failure Team Pager [phone] (M-F; 7a - 4p)  Please contact [facility] for night-coverage after hours (4p -7a ) and weekends on [email or web address]   Patient seen and examined with Oda Kilts, PA-C. We discussed all aspects of the encounter. I agree with the assessment and plan as stated above.   She feels better on milrinone. Co-ox looks good. CVP 7-8. Will drop milrinone to 0.125. Resume lasix 80mg  po daily. Supp K+. She is very deconditioned. Continue PT. Will try to wean milrinone slowly.   Bensimhon, Daniel,MD 10:41 AM

## 2015-07-04 ENCOUNTER — Inpatient Hospital Stay (HOSPITAL_COMMUNITY): Payer: Medicare Other

## 2015-07-04 ENCOUNTER — Inpatient Hospital Stay (HOSPITAL_COMMUNITY): Payer: Medicare Other | Admitting: Certified Registered"

## 2015-07-04 DIAGNOSIS — J9601 Acute respiratory failure with hypoxia: Secondary | ICD-10-CM

## 2015-07-04 DIAGNOSIS — J810 Acute pulmonary edema: Secondary | ICD-10-CM

## 2015-07-04 DIAGNOSIS — I10 Essential (primary) hypertension: Secondary | ICD-10-CM

## 2015-07-04 DIAGNOSIS — G9340 Encephalopathy, unspecified: Secondary | ICD-10-CM

## 2015-07-04 DIAGNOSIS — R570 Cardiogenic shock: Secondary | ICD-10-CM

## 2015-07-04 DIAGNOSIS — R079 Chest pain, unspecified: Secondary | ICD-10-CM

## 2015-07-04 LAB — CARBOXYHEMOGLOBIN
CARBOXYHEMOGLOBIN: 1.2 % (ref 0.5–1.5)
CARBOXYHEMOGLOBIN: 1.5 % (ref 0.5–1.5)
Methemoglobin: 0.9 % (ref 0.0–1.5)
Methemoglobin: 0.9 % (ref 0.0–1.5)
O2 SAT: 62.4 %
O2 SAT: 65.9 %
TOTAL HEMOGLOBIN: 10.1 g/dL — AB (ref 12.0–16.0)
TOTAL HEMOGLOBIN: 10.7 g/dL — AB (ref 12.0–16.0)

## 2015-07-04 LAB — URINE MICROSCOPIC-ADD ON

## 2015-07-04 LAB — COMPREHENSIVE METABOLIC PANEL
ALT: 55 U/L — AB (ref 14–54)
ANION GAP: 16 — AB (ref 5–15)
AST: 96 U/L — ABNORMAL HIGH (ref 15–41)
Albumin: 2.5 g/dL — ABNORMAL LOW (ref 3.5–5.0)
Alkaline Phosphatase: 67 U/L (ref 38–126)
BUN: 16 mg/dL (ref 6–20)
CHLORIDE: 96 mmol/L — AB (ref 101–111)
CO2: 22 mmol/L (ref 22–32)
CREATININE: 2.15 mg/dL — AB (ref 0.44–1.00)
Calcium: 8.2 mg/dL — ABNORMAL LOW (ref 8.9–10.3)
GFR, EST AFRICAN AMERICAN: 27 mL/min — AB (ref >60)
GFR, EST NON AFRICAN AMERICAN: 23 mL/min — AB (ref >60)
Glucose, Bld: 235 mg/dL — ABNORMAL HIGH (ref 65–99)
Potassium: 3.9 mmol/L (ref 3.5–5.1)
SODIUM: 134 mmol/L — AB (ref 135–145)
Total Bilirubin: 0.8 mg/dL (ref 0.3–1.2)
Total Protein: 5.9 g/dL — ABNORMAL LOW (ref 6.5–8.1)

## 2015-07-04 LAB — GLUCOSE, CAPILLARY
GLUCOSE-CAPILLARY: 81 mg/dL (ref 65–99)
GLUCOSE-CAPILLARY: 109 mg/dL — AB (ref 65–99)
GLUCOSE-CAPILLARY: 179 mg/dL — AB (ref 65–99)
GLUCOSE-CAPILLARY: 240 mg/dL — AB (ref 65–99)

## 2015-07-04 LAB — BASIC METABOLIC PANEL
ANION GAP: 9 (ref 5–15)
BUN: 18 mg/dL (ref 6–20)
CHLORIDE: 101 mmol/L (ref 101–111)
CO2: 27 mmol/L (ref 22–32)
Calcium: 8.1 mg/dL — ABNORMAL LOW (ref 8.9–10.3)
Creatinine, Ser: 1.85 mg/dL — ABNORMAL HIGH (ref 0.44–1.00)
GFR calc non Af Amer: 28 mL/min — ABNORMAL LOW (ref >60)
GFR, EST AFRICAN AMERICAN: 32 mL/min — AB (ref >60)
Glucose, Bld: 86 mg/dL (ref 65–99)
POTASSIUM: 3.2 mmol/L — AB (ref 3.5–5.1)
Sodium: 137 mmol/L (ref 135–145)

## 2015-07-04 LAB — CBC
HCT: 32.1 % — ABNORMAL LOW (ref 36.0–46.0)
HEMOGLOBIN: 10 g/dL — AB (ref 12.0–15.0)
MCH: 28.9 pg (ref 26.0–34.0)
MCHC: 31.2 g/dL (ref 30.0–36.0)
MCV: 92.8 fL (ref 78.0–100.0)
PLATELETS: 237 10*3/uL (ref 150–400)
RBC: 3.46 MIL/uL — AB (ref 3.87–5.11)
RDW: 17.9 % — AB (ref 11.5–15.5)
WBC: 11.1 10*3/uL — AB (ref 4.0–10.5)

## 2015-07-04 LAB — POCT I-STAT 3, ART BLOOD GAS (G3+)
ACID-BASE EXCESS: 3 mmol/L — AB (ref 0.0–2.0)
BICARBONATE: 27.7 meq/L — AB (ref 20.0–24.0)
O2 SAT: 100 %
TCO2: 29 mmol/L (ref 0–100)
pCO2 arterial: 43.9 mmHg (ref 35.0–45.0)
pH, Arterial: 7.408 (ref 7.350–7.450)
pO2, Arterial: 274 mmHg — ABNORMAL HIGH (ref 80.0–100.0)

## 2015-07-04 LAB — URINALYSIS, ROUTINE W REFLEX MICROSCOPIC
Bilirubin Urine: NEGATIVE
GLUCOSE, UA: NEGATIVE mg/dL
KETONES UR: NEGATIVE mg/dL
Nitrite: NEGATIVE
PH: 6 (ref 5.0–8.0)
Protein, ur: 100 mg/dL — AB
Specific Gravity, Urine: 1.01 (ref 1.005–1.030)

## 2015-07-04 LAB — MAGNESIUM
MAGNESIUM: 1.8 mg/dL (ref 1.7–2.4)
Magnesium: 2.4 mg/dL (ref 1.7–2.4)

## 2015-07-04 LAB — HEPARIN LEVEL (UNFRACTIONATED): HEPARIN UNFRACTIONATED: 0.27 [IU]/mL — AB (ref 0.30–0.70)

## 2015-07-04 LAB — PROTIME-INR
INR: 1.68 — ABNORMAL HIGH (ref 0.00–1.49)
PROTHROMBIN TIME: 19.8 s — AB (ref 11.6–15.2)

## 2015-07-04 LAB — TROPONIN I
TROPONIN I: 0.08 ng/mL — AB (ref <0.031)
TROPONIN I: 0.49 ng/mL — AB (ref <0.031)

## 2015-07-04 LAB — D-DIMER, QUANTITATIVE (NOT AT ARMC): D DIMER QUANT: 2.96 ug{FEU}/mL — AB (ref 0.00–0.50)

## 2015-07-04 MED ORDER — SUCCINYLCHOLINE CHLORIDE 20 MG/ML IJ SOLN
INTRAMUSCULAR | Status: DC | PRN
  Administered 2015-07-04: 100 mg via INTRAVENOUS

## 2015-07-04 MED ORDER — POTASSIUM CHLORIDE 20 MEQ/15ML (10%) PO SOLN
40.0000 meq | Freq: Two times a day (BID) | ORAL | Status: DC
  Administered 2015-07-04 – 2015-07-14 (×19): 40 meq via ORAL
  Filled 2015-07-04 (×20): qty 30

## 2015-07-04 MED ORDER — MAGNESIUM SULFATE 2 GM/50ML IV SOLN
2.0000 g | Freq: Once | INTRAVENOUS | Status: AC
  Administered 2015-07-04: 2 g via INTRAVENOUS
  Filled 2015-07-04: qty 50

## 2015-07-04 MED ORDER — ROCURONIUM BROMIDE 50 MG/5ML IV SOLN
50.0000 mg | Freq: Once | INTRAVENOUS | Status: AC
  Administered 2015-07-04: 50 mg via INTRAVENOUS

## 2015-07-04 MED ORDER — SPIRONOLACTONE 25 MG PO TABS
12.5000 mg | ORAL_TABLET | Freq: Every day | ORAL | Status: DC
  Administered 2015-07-04: 12.5 mg via ORAL
  Filled 2015-07-04: qty 1

## 2015-07-04 MED ORDER — ALLOPURINOL 100 MG PO TABS
100.0000 mg | ORAL_TABLET | Freq: Every day | ORAL | Status: DC
Start: 2015-07-05 — End: 2015-07-17
  Administered 2015-07-05 – 2015-07-17 (×13): 100 mg
  Filled 2015-07-04 (×13): qty 1

## 2015-07-04 MED ORDER — FENTANYL CITRATE (PF) 100 MCG/2ML IJ SOLN
INTRAMUSCULAR | Status: AC
  Administered 2015-07-04: 100 ug
  Filled 2015-07-04: qty 2

## 2015-07-04 MED ORDER — SODIUM CHLORIDE 0.9 % IV SOLN
0.0000 mg/h | INTRAVENOUS | Status: DC
  Administered 2015-07-04 – 2015-07-06 (×3): 2 mg/h via INTRAVENOUS
  Administered 2015-07-07: 3 mg/h via INTRAVENOUS
  Filled 2015-07-04 (×4): qty 10

## 2015-07-04 MED ORDER — HEPARIN (PORCINE) IN NACL 100-0.45 UNIT/ML-% IJ SOLN
1100.0000 [IU]/h | INTRAMUSCULAR | Status: DC
  Administered 2015-07-04 (×2): 1000 [IU]/h via INTRAVENOUS
  Filled 2015-07-04 (×4): qty 250

## 2015-07-04 MED ORDER — FERROUS SULFATE 300 (60 FE) MG/5ML PO SYRP
300.0000 mg | ORAL_SOLUTION | Freq: Two times a day (BID) | ORAL | Status: DC
  Administered 2015-07-04 – 2015-07-17 (×26): 300 mg
  Filled 2015-07-04 (×27): qty 5

## 2015-07-04 MED ORDER — FENTANYL CITRATE (PF) 100 MCG/2ML IJ SOLN: 50.0000 ug | Freq: Once | INTRAMUSCULAR | Status: DC

## 2015-07-04 MED ORDER — WARFARIN SODIUM 2.5 MG PO TABS: 2.5000 mg | ORAL_TABLET | Freq: Once | ORAL | Status: DC

## 2015-07-04 MED ORDER — MIDAZOLAM HCL 2 MG/2ML IJ SOLN
INTRAMUSCULAR | Status: AC
  Administered 2015-07-04: 2 mg
  Filled 2015-07-04: qty 2

## 2015-07-04 MED ORDER — FUROSEMIDE 10 MG/ML IJ SOLN
40.0000 mg | Freq: Two times a day (BID) | INTRAMUSCULAR | Status: DC
  Administered 2015-07-04 – 2015-07-05 (×3): 40 mg via INTRAVENOUS
  Filled 2015-07-04 (×3): qty 4

## 2015-07-04 MED ORDER — WARFARIN SODIUM 5 MG PO TABS: 5.0000 mg | ORAL_TABLET | Freq: Once | ORAL | Status: DC

## 2015-07-04 MED ORDER — SPIRONOLACTONE 25 MG PO TABS: 12.5000 mg | ORAL_TABLET | Freq: Every day | ORAL | Status: DC

## 2015-07-04 MED ORDER — HYDRALAZINE HCL 25 MG PO TABS
37.5000 mg | ORAL_TABLET | Freq: Three times a day (TID) | ORAL | Status: DC
  Administered 2015-07-04: 37.5 mg via ORAL
  Filled 2015-07-04 (×2): qty 2

## 2015-07-04 MED ORDER — POTASSIUM CHLORIDE CRYS ER 20 MEQ PO TBCR
40.0000 meq | EXTENDED_RELEASE_TABLET | Freq: Two times a day (BID) | ORAL | Status: DC
  Administered 2015-07-04: 40 meq via ORAL
  Filled 2015-07-04: qty 2

## 2015-07-04 MED ORDER — AMIODARONE HCL IN DEXTROSE 360-4.14 MG/200ML-% IV SOLN
30.0000 mg/h | INTRAVENOUS | Status: DC
Start: 2015-07-04 — End: 2015-07-13
  Administered 2015-07-04: 30 mg/h via INTRAVENOUS
  Administered 2015-07-05 (×2): 60 mg/h via INTRAVENOUS
  Administered 2015-07-05 – 2015-07-12 (×14): 30 mg/h via INTRAVENOUS
  Filled 2015-07-04 (×19): qty 200

## 2015-07-04 MED ORDER — SODIUM CHLORIDE 0.9 % IV SOLN
10.0000 ug/h | INTRAVENOUS | Status: DC
  Filled 2015-07-04: qty 50

## 2015-07-04 MED ORDER — ANTISEPTIC ORAL RINSE SOLUTION (CORINZ)
7.0000 mL | Freq: Four times a day (QID) | OROMUCOSAL | Status: DC
  Administered 2015-07-05 – 2015-07-08 (×14): 7 mL via OROMUCOSAL

## 2015-07-04 MED ORDER — FAMOTIDINE 40 MG/5ML PO SUSR
20.0000 mg | Freq: Every day | ORAL | Status: DC
  Administered 2015-07-05 – 2015-07-11 (×7): 20 mg
  Filled 2015-07-04 (×7): qty 2.5

## 2015-07-04 MED ORDER — FENTANYL BOLUS VIA INFUSION
25.0000 ug | INTRAVENOUS | Status: DC | PRN
  Administered 2015-07-07 – 2015-07-16 (×9): 25 ug via INTRAVENOUS
  Filled 2015-07-04: qty 25

## 2015-07-04 MED ORDER — LEVOTHYROXINE SODIUM 100 MCG IV SOLR
50.0000 ug | Freq: Every day | INTRAVENOUS | Status: DC
  Administered 2015-07-05: 50 ug via INTRAVENOUS
  Filled 2015-07-04: qty 5

## 2015-07-04 MED ORDER — MIDAZOLAM BOLUS VIA INFUSION
1.0000 mg | INTRAVENOUS | Status: DC | PRN
  Filled 2015-07-04: qty 2

## 2015-07-04 MED ORDER — CHLORHEXIDINE GLUCONATE 0.12% ORAL RINSE (MEDLINE KIT)
15.0000 mL | Freq: Two times a day (BID) | OROMUCOSAL | Status: DC
  Administered 2015-07-04 – 2015-07-08 (×8): 15 mL via OROMUCOSAL

## 2015-07-04 MED ORDER — MILRINONE IN DEXTROSE 20 MG/100ML IV SOLN
0.1250 ug/kg/min | INTRAVENOUS | Status: DC
Start: 2015-07-04 — End: 2015-07-08
  Administered 2015-07-04: 0.125 ug/kg/min via INTRAVENOUS

## 2015-07-04 MED ORDER — LIDOCAINE IN D5W 4-5 MG/ML-% IV SOLN
1.0000 mg/min | INTRAVENOUS | Status: DC
  Administered 2015-07-04 – 2015-07-06 (×2): 1 mg/min via INTRAVENOUS
  Filled 2015-07-04 (×2): qty 500

## 2015-07-04 MED ORDER — DOPAMINE-DEXTROSE 3.2-5 MG/ML-% IV SOLN
0.0000 ug/kg/min | INTRAVENOUS | Status: DC
  Administered 2015-07-04: 5 ug/kg/min via INTRAVENOUS
  Filled 2015-07-04: qty 250

## 2015-07-04 MED ORDER — PIPERACILLIN-TAZOBACTAM 3.375 G IVPB
3.3750 g | Freq: Three times a day (TID) | INTRAVENOUS | Status: DC
  Administered 2015-07-04 – 2015-07-10 (×18): 3.375 g via INTRAVENOUS
  Filled 2015-07-04 (×20): qty 50

## 2015-07-04 MED ORDER — ETOMIDATE 2 MG/ML IV SOLN
INTRAVENOUS | Status: DC | PRN
  Administered 2015-07-04: 12 mg via INTRAVENOUS

## 2015-07-04 MED ORDER — SODIUM CHLORIDE 0.9 % IV SOLN
1.0000 mg/h | INTRAVENOUS | Status: DC
  Filled 2015-07-04: qty 10

## 2015-07-04 MED ORDER — DOPAMINE-DEXTROSE 3.2-5 MG/ML-% IV SOLN: 0.0000 ug/kg/min | INTRAVENOUS | Status: DC

## 2015-07-04 MED ORDER — LIDOCAINE BOLUS VIA INFUSION
85.0000 mg | Freq: Once | INTRAVENOUS | Status: AC
  Administered 2015-07-04: 85 mg via INTRAVENOUS
  Filled 2015-07-04: qty 88

## 2015-07-04 MED ORDER — INSULIN ASPART 100 UNIT/ML ~~LOC~~ SOLN
0.0000 [IU] | SUBCUTANEOUS | Status: DC
  Administered 2015-07-04: 3 [IU] via SUBCUTANEOUS
  Administered 2015-07-05: 9 [IU] via SUBCUTANEOUS
  Administered 2015-07-05: 2 [IU] via SUBCUTANEOUS
  Administered 2015-07-06 – 2015-07-07 (×5): 1 [IU] via SUBCUTANEOUS
  Administered 2015-07-07: 2 [IU] via SUBCUTANEOUS
  Administered 2015-07-07 (×3): 1 [IU] via SUBCUTANEOUS
  Administered 2015-07-08: 2 [IU] via SUBCUTANEOUS
  Administered 2015-07-08 (×2): 1 [IU] via SUBCUTANEOUS
  Administered 2015-07-09 (×2): 2 [IU] via SUBCUTANEOUS
  Administered 2015-07-09: 1 [IU] via SUBCUTANEOUS
  Administered 2015-07-09: 2 [IU] via SUBCUTANEOUS
  Administered 2015-07-09 – 2015-07-12 (×12): 1 [IU] via SUBCUTANEOUS
  Administered 2015-07-12: 2 [IU] via SUBCUTANEOUS
  Administered 2015-07-13 – 2015-07-17 (×13): 1 [IU] via SUBCUTANEOUS
  Administered 2015-07-17: 2 [IU] via SUBCUTANEOUS

## 2015-07-04 MED ORDER — ASPIRIN 81 MG PO CHEW
81.0000 mg | CHEWABLE_TABLET | Freq: Every day | ORAL | Status: DC
  Administered 2015-07-05 – 2015-07-17 (×13): 81 mg
  Filled 2015-07-04 (×13): qty 1

## 2015-07-04 MED ORDER — SODIUM CHLORIDE 0.9 % IV SOLN
25.0000 ug/h | INTRAVENOUS | Status: DC
  Administered 2015-07-04: 50 ug/h via INTRAVENOUS
  Administered 2015-07-05: 100 ug/h via INTRAVENOUS
  Administered 2015-07-06: 150 ug/h via INTRAVENOUS
  Administered 2015-07-07: 100 ug/h via INTRAVENOUS
  Administered 2015-07-08: 175 ug/h via INTRAVENOUS
  Administered 2015-07-09: 200 ug/h via INTRAVENOUS
  Administered 2015-07-09: 175 ug/h via INTRAVENOUS
  Administered 2015-07-10: 300 ug/h via INTRAVENOUS
  Administered 2015-07-10: 250 ug/h via INTRAVENOUS
  Administered 2015-07-10: 300 ug/h via INTRAVENOUS
  Administered 2015-07-11: 250 ug/h via INTRAVENOUS
  Administered 2015-07-11 – 2015-07-12 (×4): 300 ug/h via INTRAVENOUS
  Administered 2015-07-13: 200 ug/h via INTRAVENOUS
  Administered 2015-07-13: 300 ug/h via INTRAVENOUS
  Administered 2015-07-14 (×2): 250 ug/h via INTRAVENOUS
  Administered 2015-07-15: 175 ug/h via INTRAVENOUS
  Administered 2015-07-15: 225 ug/h via INTRAVENOUS
  Administered 2015-07-16: 200 ug/h via INTRAVENOUS
  Filled 2015-07-04 (×22): qty 50

## 2015-07-04 MED FILL — Medication: Qty: 1 | Status: AC

## 2015-07-04 NOTE — Anesthesia Procedure Notes (Signed)
Procedure Name: Intubation Date/Time: 07/04/2015 1:51 PM Performed by: Myna Bright Pre-anesthesia Checklist: Patient identified, Emergency Drugs available, Suction available and Patient being monitored Patient Re-evaluated:Patient Re-evaluated prior to inductionOxygen Delivery Method: Ambu bag Preoxygenation: Pre-oxygenation with 100% oxygen Intubation Type: IV induction, Rapid sequence and Cricoid Pressure applied Laryngoscope Size: Mac and 3 Grade View: Grade I Tube type: Subglottic suction tube Tube size: 7.5 mm Number of attempts: 1 Airway Equipment and Method: Stylet Placement Confirmation: ETT inserted through vocal cords under direct vision,  breath sounds checked- equal and bilateral and CO2 detector Secured at: 21 cm Tube secured with: Tape Dental Injury: Teeth and Oropharynx as per pre-operative assessment

## 2015-07-04 NOTE — Progress Notes (Signed)
Complained of chest pain scale of-8 crushing pain, bp-154/94, nitro sl x2 given with relief, xanax 0.5 mg given. Cont. To monitor.

## 2015-07-04 NOTE — Progress Notes (Signed)
Chaplain responded to the Code [facility] page for this patient.  Upon arrival to the Unit the patient was receiving medical intervention by the Code Team. The family was present in the room, Chaplain presented to them to provide spiritual care support.  They were escorted to the consult room for further updates from the medical staff., who provided current medical status of the patient. The patient was place on the vent and transferred from room 20 to room 11. The family was notified of the transfer.  Comfort measures, words of encouragement and other needs were provided by the Chaplain, follow up will continue as needed. Chaplain Yaakov Guthrie [phone]

## 2015-07-04 NOTE — Progress Notes (Signed)
Advanced Heart Failure Rounding Note  PCP: Karna Dupes, PA-C Primary Cardiologist: Johnsie Cancel EP: Allred  Subjective:    Started on empiric milrinone 07/02/15 with presentation concerning for low output.  PICC placed. Coox this am 62.4% on milrinone 0.125.  Feeling better. Walked around unit yesterday, though still had some SOB prior to returning room. Before yesterday, could barely make it outside the room. States she had some L arm pain when she first woke up and states her Left groin is tender and states she has a "knot" there. CVP 6-7  Creatinine much improved 2.74 -> 2.39 -> 1.85. K  3.2 this am.  Weights all over the place. Up 5 lbs overall this admission, and positive 1.2 L.    Objective:   Weight Range: 187 lb 6.3 oz (85 kg) Body mass index is 31.18 kg/(m^2).   Vital Signs:   Temp:  [98.3 F (36.8 C)-98.7 F (37.1 C)] 98.7 F (37.1 C) (04/11 0400) Pulse Rate:  [74-83] 78 (04/11 0600) Resp:  [13-24] 20 (04/11 0600) BP: (114-154)/(64-89) 118/66 mmHg (04/11 0600) SpO2:  [99 %-100 %] 100 % (04/11 0600) Weight:  [187 lb 6.3 oz (85 kg)] 187 lb 6.3 oz (85 kg) (04/11 0400) Last BM Date: 07/02/15  Weight change: Filed Weights   07/02/15 0600 07/03/15 0500 07/04/15 0400  Weight: 189 lb 13.1 oz (86.1 kg) 196 lb 3.4 oz (89 kg) 187 lb 6.3 oz (85 kg)    Intake/Output:   Intake/Output Summary (Last 24 hours) at 07/04/15 0722 Last data filed at 07/04/15 0600  Gross per 24 hour  Intake 1287.83 ml  Output    200 ml  Net 1087.83 ml     Physical Exam: CVP 6-7 General:  Elderly appearing. Slightly increased work of breathing with long sentences. HEENT: normal Neck: supple. JVP 7-8 cm. Carotids 2+ bilat; no bruits. No thyromegaly or nodule noted. Cor: PMI nondisplaced. RRR. No rubs or gallops. MR murmur Lungs: clear Abdomen: soft, NT, ND, no HSM. No bruits or masses. +BS  Extremities: no cyanosis, clubbing, rash, edema. Left groin tenderness out of proportion to exam.  No nodule or warmth Neuro: alert & orientedx3, cranial nerves grossly intact. moves all 4 extremities w/o difficulty. Affect pleasant  Telemetry: Reviewed personally, NSR 90s  Labs: CBC  Recent Labs  07/02/15 0244 07/03/15 0528  WBC 4.9 4.3  HGB 8.2* 8.6*  HCT 26.8* 27.2*  MCV 92.4 91.0  PLT 180 0000000   Basic Metabolic Panel  Recent Labs  07/03/15 0528 07/04/15 0400  NA 139 137  K 2.9* 3.2*  CL 99* 101  CO2 29 27  GLUCOSE 120* 86  BUN 25* 18  CREATININE 2.39* 1.85*  CALCIUM 8.2* 8.1*  MG 1.6* 1.8   Liver Function Tests No results for input(s): AST, ALT, ALKPHOS, BILITOT, PROT, ALBUMIN in the last 72 hours. No results for input(s): LIPASE, AMYLASE in the last 72 hours. Cardiac Enzymes No results for input(s): CKTOTAL, CKMB, CKMBINDEX, TROPONINI in the last 72 hours.  BNP: BNP (last 3 results)  Recent Labs  02/21/15 1042 03/01/15 0839 04/13/15 2215  BNP 386.1* 452.0* 894.0*    ProBNP (last 3 results) No results for input(s): PROBNP in the last 8760 hours.   D-Dimer No results for input(s): DDIMER in the last 72 hours. Hemoglobin A1C No results for input(s): HGBA1C in the last 72 hours. Fasting Lipid Panel No results for input(s): CHOL, HDL, LDLCALC, TRIG, CHOLHDL, LDLDIRECT in the last 72 hours. Thyroid Function Tests  No results for input(s): TSH, T4TOTAL, T3FREE, THYROIDAB in the last 72 hours.  Invalid input(s): FREET3  Other results:     Imaging/Studies:  No results found.  Latest Echo  Latest Cath   Medications:     Scheduled Medications: . albuterol  2.5 mg Nebulization TID  . allopurinol  100 mg Oral Daily  . amiodarone  200 mg Oral Daily  . aspirin EC  81 mg Oral Daily  . atorvastatin  40 mg Oral q1800  . colchicine  0.3 mg Oral Daily  . famotidine  20 mg Oral Daily  . feeding supplement (GLUCERNA SHAKE)  237 mL Oral BID BM  . ferrous sulfate  325 mg Oral BID WC  . furosemide  80 mg Oral Daily  . heparin  5,000 Units  Subcutaneous 3 times per day  . hydrALAZINE  25 mg Oral 3 times per day  . insulin aspart  0-9 Units Subcutaneous TID WC  . isosorbide dinitrate  20 mg Oral TID  . levothyroxine  100 mcg Oral QAC breakfast  . pantoprazole  40 mg Oral Daily  . PARoxetine  10 mg Oral Daily  . QUEtiapine  25 mg Oral QHS  . sodium chloride flush  10-40 mL Intracatheter Q12H  . sodium chloride flush  3 mL Intravenous Q12H  . sodium chloride flush  3 mL Intravenous Q12H  . Warfarin - Pharmacist Dosing Inpatient   Does not apply q1800    Infusions: . milrinone 0.125 mcg/kg/min (07/04/15 0256)    PRN Medications: sodium chloride, [DISCONTINUED] acetaminophen **OR** acetaminophen, acetaminophen, ALPRAZolam, diphenhydrAMINE, morphine injection, nitroGLYCERIN, ondansetron (ZOFRAN) IV, ondansetron **OR** [DISCONTINUED] ondansetron (ZOFRAN) IV, oxyCODONE-acetaminophen, promethazine, sodium chloride flush, sodium chloride flush   Assessment   1. Acute on chronic combined CHF - EF 20-25% w/ diffuse HK and akinesis of the inferolateral and inferior myocardium, G2DD, severe MR, mild RV dysfunction/dilation, + atrial septal anuerysm 2. Severe MR - by TEE 3. Severe Pulm HTN - PA pressure 90 mm Hg 4. PAF 5. AKI on CKD stage IV  6. Chronic anemia - workup in 03/2014 unremarkable GI bleed. 7. DM2 8. HLD 9. Hypokalemia  Plan    This is a 65 year old female patient with h/o of CAD, HTN, ICM s/p MDT dual chamber ICD 2013, lymphoma s/p chemo/radiation 123XX123, chronic systolic CHF, status post VT ablation 2, HTN, HLD, VT, DM, CKD stage III, PMR and depression who presented to [facility] with CP and SOB.  Volume status stable.  Continue Lasix 80 mg daily. Supp K. Will add spiro 12.5.   No CBC this morning. Anemia per primary team.   Coox stable to marginal at 62% on 0.125 milrinone. Stop milrinone and recheck coox this afternoon.     Length of Stay: 7  Shirley Friar PA-C 07/04/2015, 7:22 AM  Advanced Heart  Failure Team Pager [phone] (M-F; 7a - 4p)  Please contact [facility] for night-coverage after hours (4p -7a ) and weekends on amion  Patient seen and examined with Oda Kilts, PA-C. We discussed all aspects of the encounter. I agree with the assessment and plan as stated above. '  Improving on milrinone. Co-ox 62%. Renal function better. Feels better. Will increase hydralazine and Imdur. Continue milrinone one more day before trying to off. CVP 6-7. Continue lasix at current dose. Given severe PAH will need RHC after diuresis complete.  Bensimhon, Daniel,MD 8:35 AM

## 2015-07-04 NOTE — Progress Notes (Signed)
[facility] PROGRESS NOTE  [name] Terry [id number] DOB: Jun 26, 1950 DOA: 07/21/2015 PCP: Pcp Not In System Admit HPI / Brief Narrative: [name] Terry is a 65 y.o. BF PMHx Anxiety, CAD native artery, S/P stenting, Chronic Systolic CHF S/P ICD placement, proximal respiratory ablation, DM Type 2 with complications  Chronic anemia, CKD Stage IV   Presents to the ER because of chest pain. Patient has been having chest pain since yesterday morning. Pain is mostly on the left side of the chest radiating to her lower back. Had one episode of nausea vomiting and has been exertional shortness of breath. Pain persisted throughout the day. EKG was showing nonspecific changes and chest x-ray was unremarkable. Troponins are negative. Patient's chest pain improved with sublingual nitroglycerin. Patient's d-dimer is elevated. Since patient has chronic kidney disease will need VQ scan and cannot get CT scan to rule out PE. Patient states she was switched from Eliquis to Coumadin while she was in a nursing home. Stool for occult blood has been negative.  ECHO with inferior and inferolateral WMA, EF downt o 20-25% 4/6:TEE: Severely dilated LV with severe LV dysfunction. EF 25-30%, Mildly dilated RV with mildly reduced RV function. Severely dilated RA, Severely dilated LA abd Severe MR 4/7: LHC: Diffuse disease, med Mgt 4/9: CHF team consulted, started on Lasix and milrinone 4/10: CVP low, lasix changed to PO  HPI/Subjective: Feels well, no complaints, breathing well, still waiting to talk to CSW  Assessment/Plan: Chest pain/CAD status post stenting in 2015 -Troponin minimally elevated, but ECHO with inferior and inferolateral WMA, EF down to 20-25% from 35-40% in Nov -had inferior STEMI in Nov,- cardiac cath then showed 100% stenosis of prox PDA, patent stent in RCA with otherwise normal coronary arteries -VQ scan : negative for PE -s/p LHC 4/7 : cath showed diffuse disease,not amenable to PCI at this  time, medical management recommended -continue ASA/coreg/statin  Acute on Chronic Systolic CHF /Severe MR noted on TEE - EF now down to 20-25% with new WMA - status post AICD placement - - continue Coreg, Hydralazine, Isosorbide dinitrate 20 mg TID -On Torsemide. 20 mg M/W/F at home, required Iv lasix x1 4/5 -appears euvolemic, now CVP<8, on milrinone and PO lasix per Cards -no ACE due to CKD4  Paroxysmal atrial fibrillation chads 2 vasc score is 4  -continue coreg, amiodarone -Coumadin resumed, INR 1.68 now  Chronic kidney disease stage IV -baseline 2.8  -LV gram not done, no ACE -creatinine improved, monitor  Chronic anemia  -likely due to CKD  -workup in January 2016 was unremarkable for signs of GI bleed. Workup included EGD and colonoscopy. Stool for occult blood has been negative  -s/p 1 unit PRBC 4/4 -stable, monitor, CBC in am  Diabetes mellitus type 2 controlled with complications - Last hemoglobin A1c recorded in the chart is 5.8. - stable, continue SSI  Hyperlipidemia - Lipitor 40 mg daily  Hypothyroidism  -was on Levothyroxine 75 g daily  -TSH elevated, increased dose to 139mcg  Hypokalemia -replaced  Hypomagnesemia -replaced  Cdiff colonization -no more diarrhea, Cdiff Ag positive and toxin negative -no need to Rx  DVT proph: Resumed Warfarin and continue DVT proph till INR >2  Code Status: Full Family Communication: None  Disposition Plan: Keep in SDU while on milrinone, needs new SNF when stable  Consultants: [facility]   Procedures:  Cultures NA   Antibiotics: NA   DVT prophylaxis COumadin   Objective: Filed Vitals:   07/04/15 0400 07/04/15  0600 07/04/15 0833 07/04/15 0836  BP: 131/83 118/66 144/90   Pulse: 80 78 91   Temp: 98.7 F (37.1 C)  98.7 F (37.1 C)   TempSrc: Oral  Oral   Resp: 21 20 19    Height:      Weight: 85 kg (187 lb 6.3 oz)     SpO2: 100% 100% 100% 100%    Intake/Output Summary  (Last 24 hours) at 07/04/15 1103 Last data filed at 07/04/15 1000  Gross per 24 hour  Intake  853.6 ml  Output    200 ml  Net  653.6 ml   Filed Weights   07/02/15 0600 07/03/15 0500 07/04/15 0400  Weight: 86.1 kg (189 lb 13.1 oz) 89 kg (196 lb 3.4 oz) 85 kg (187 lb 6.3 oz)     Exam: General: AAOx3, no distress HEENT: PERRLA Lungs: CTAB Cardiovascular: Regular rate and rhythm without murmur gallop or rub normal S1 and S2 Abdomen:negative abdominal pain, negative dysphagia, nondistended, positive soft, bowel sounds, no rebound, no ascites, no appreciable mass, left chest wall AICD present Extremities: No significant cyanosis, clubbing, or edema bilateral lower extremities Psychiatric:  Flat affect Neurologic:  Non focal    Data Reviewed: Basic Metabolic Panel:  Recent Labs Lab 06/28/15 0418  06/27/2015 0340 07/01/15 0246 07/02/15 0244 07/03/15 0528 07/04/15 0400  NA 141  < > 136 137 136 139 137  K 3.8  < > 4.4 4.1 3.6 2.9* 3.2*  CL 103  < > 101 100* 100* 99* 101  CO2 28  < > 23 25 26 29 27   GLUCOSE 123*  < > 168* 144* 113* 120* 86  BUN 18  < > 20 22* 28* 25* 18  CREATININE 2.41*  < > 2.25* 2.35* 2.74* 2.39* 1.85*  CALCIUM 9.0  < > 8.9 8.8* 8.6* 8.2* 8.1*  MG 1.9  --   --   --   --  1.6* 1.8  < > = values in this interval not displayed. Liver Function Tests:  Recent Labs Lab 06/28/15 0418  AST 41  ALT 28  ALKPHOS 61  BILITOT 0.8  PROT 6.4*  ALBUMIN 2.6*   No results for input(s): LIPASE, AMYLASE in the last 168 hours. No results for input(s): AMMONIA in the last 168 hours. CBC:  Recent Labs Lab 06/28/15 0418 07/16/2015 0215 07/14/2015 0340 07/01/15 0246 07/02/15 0244 07/03/15 0528  WBC 3.8* 4.3 3.6* 5.2 4.9 4.3  NEUTROABS 2.6  --   --   --   --   --   HGB 9.2* 9.2* 9.4* 8.6* 8.2* 8.6*  HCT 28.0* 27.7* 30.4* 27.9* 26.8* 27.2*  MCV 92.4 92.3 92.1 92.1 92.4 91.0  PLT 180 164 184 180 180 181   Cardiac Enzymes:  Recent Labs Lab 06/27/15 1650   TROPONINI 0.05*   BNP (last 3 results)  Recent Labs  02/21/15 1042 03/01/15 0839 04/13/15 2215  BNP 386.1* 452.0* 894.0*    ProBNP (last 3 results) No results for input(s): PROBNP in the last 8760 hours.  CBG:  Recent Labs Lab 07/03/15 0748 07/03/15 1144 07/03/15 1502 07/03/15 2138 07/04/15 0800  GLUCAP 98 134* 129* 142* 81    Recent Results (from the past 240 hour(s))  MRSA PCR Screening     Status: None   Collection Time: 06/27/15  3:54 AM  Result Value Ref Range Status   MRSA by PCR NEGATIVE NEGATIVE Final    Comment:        The GeneXpert MRSA Assay (  FDA approved for NASAL specimens only), is one component of a comprehensive MRSA colonization surveillance program. It is not intended to diagnose MRSA infection nor to guide or monitor treatment for MRSA infections.   C difficile quick scan w PCR reflex     Status: Abnormal   Collection Time: 06/28/15  6:52 PM  Result Value Ref Range Status   C Diff antigen POSITIVE (A) NEGATIVE Final   C Diff toxin NEGATIVE NEGATIVE Final   C Diff interpretation   Final    C. difficile present, but toxin not detected. This indicates colonization. In most cases, this does not require treatment. If patient has signs and symptoms consistent with colitis, consider treatment. Requires ENTERIC precautions.  MRSA PCR Screening     Status: None   Collection Time: 07/02/2015  4:20 PM  Result Value Ref Range Status   MRSA by PCR NEGATIVE NEGATIVE Final    Comment:        The GeneXpert MRSA Assay (FDA approved for NASAL specimens only), is one component of a comprehensive MRSA colonization surveillance program. It is not intended to diagnose MRSA infection nor to guide or monitor treatment for MRSA infections.      Studies: No results found.  Scheduled Meds: . albuterol  2.5 mg Nebulization TID  . allopurinol  100 mg Oral Daily  . amiodarone  200 mg Oral Daily  . aspirin EC  81 mg Oral Daily  . atorvastatin  40 mg Oral  q1800  . colchicine  0.3 mg Oral Daily  . famotidine  20 mg Oral Daily  . feeding supplement (GLUCERNA SHAKE)  237 mL Oral BID BM  . ferrous sulfate  325 mg Oral BID WC  . furosemide  80 mg Oral Daily  . hydrALAZINE  37.5 mg Oral 3 times per day  . insulin aspart  0-9 Units Subcutaneous TID WC  . isosorbide dinitrate  20 mg Oral TID  . levothyroxine  100 mcg Oral QAC breakfast  . pantoprazole  40 mg Oral Daily  . PARoxetine  10 mg Oral Daily  . potassium chloride  40 mEq Oral BID  . QUEtiapine  25 mg Oral QHS  . sodium chloride flush  10-40 mL Intracatheter Q12H  . sodium chloride flush  3 mL Intravenous Q12H  . sodium chloride flush  3 mL Intravenous Q12H  . spironolactone  12.5 mg Oral Daily  . warfarin  2.5 mg Oral ONCE-1800  . Warfarin - Pharmacist Dosing Inpatient   Does not apply q1800   Continuous Infusions: . milrinone 0.125 mcg/kg/min (07/04/15 0941)    Principal Problem:   Chest pain Active Problems:   HTN (hypertension)   Cardiomyopathy, ischemic-EF 35-40%   CAD S/P prior RCA PCI, cath 05/03/2013- med Rx, STEMI 01/2015 - 100% prox PDA, treated medically   DM2 with nephropathy   Mitral regurgitation   Persistent atrial fibrillation (HCC)   Chronic systolic CHF (congestive heart failure) ([place])   Chest pain at rest   Controlled diabetes mellitus type 2 with complications ([place])   Hypomagnesemia   Pain in the chest   Congestive dilated cardiomyopathy ([place])    Time spent: 40 minutes    [facility]  [facility] Pager [phone]. If 7PM-7AM, please contact night-coverage at [email or web address], password [facility] 07/04/2015, 11:03 AM  LOS: 7 days

## 2015-07-04 NOTE — Progress Notes (Signed)
Complained of sob, no sign of distress ,  her sat is 97 % on o2 2l [place],  Req. for her  o2 to increase to 4l [place]- done. Continue to monitor.

## 2015-07-04 NOTE — Progress Notes (Addendum)
[place] for coumadin > heparin Indication: afib  Allergies  Allergen Reactions  . Flagyl [Metronidazole] Swelling and Rash    Face swells   . Contrast Media [Iodinated Diagnostic Agents] Rash  . Ioxaglate Rash  . Potassium Sulfate Rash and Other (See Comments)    Headaches  . Potassium-Containing Compounds Other (See Comments)    Headaches-- reports no problems now    Patient Measurements: Height: 5\' 5"  (165.1 cm) Weight: 187 lb 6.3 oz (85 kg) IBW/kg (Calculated) : 57 Heparin Dosing Weight: 74.7kg  Vital Signs: Temp: 98.7 F (37.1 C) (04/11 0400) Temp Source: Oral (04/11 0400) BP: 118/66 mmHg (04/11 0600) Pulse Rate: 78 (04/11 0600)  Labs:  Recent Labs  07/02/15 0244 07/03/15 0528 07/04/15 0400  HGB 8.2* 8.6*  --   HCT 26.8* 27.2*  --   PLT 180 181  --   LABPROT 16.4* 17.2* 19.8*  INR 1.31 1.40 1.68*  CREATININE 2.74* 2.39* 1.85*    Estimated Creatinine Clearance: 33.5 mL/min (by C-G formula based on Cr of 1.85).    Scheduled:  . albuterol  2.5 mg Nebulization TID  . allopurinol  100 mg Oral Daily  . amiodarone  200 mg Oral Daily  . aspirin EC  81 mg Oral Daily  . atorvastatin  40 mg Oral q1800  . colchicine  0.3 mg Oral Daily  . famotidine  20 mg Oral Daily  . feeding supplement (GLUCERNA SHAKE)  237 mL Oral BID BM  . ferrous sulfate  325 mg Oral BID WC  . furosemide  80 mg Oral Daily  . hydrALAZINE  25 mg Oral 3 times per day  . insulin aspart  0-9 Units Subcutaneous TID WC  . isosorbide dinitrate  20 mg Oral TID  . levothyroxine  100 mcg Oral QAC breakfast  . magnesium sulfate 1 - 4 g bolus IVPB  2 g Intravenous Once  . pantoprazole  40 mg Oral Daily  . PARoxetine  10 mg Oral Daily  . potassium chloride  40 mEq Oral BID  . QUEtiapine  25 mg Oral QHS  . sodium chloride flush  10-40 mL Intracatheter Q12H  . sodium chloride flush  3 mL Intravenous Q12H  . sodium chloride flush  3 mL Intravenous Q12H  .  spironolactone  12.5 mg Oral Daily  . warfarin  2.5 mg Oral ONCE-1800  . Warfarin - Pharmacist Dosing Inpatient   Does not apply q1800    Assessment: 65 yo female here with CP and with history of CAD with PCI.  She is on coumadin PTA for afib (CHADSVASC= 7/9). Pharmacy consulted to dose coumadin. L/RHC when INR < 1.8 received 2 doses Vit K and cath 4/7   INR 1.3> 1.4>1.7 decent jump overnight will back down dose to prevent over shoot. CBC stable  Home warfarin 5mg  daily  Goal of Therapy:  INR 2-3 Monitor platelets by anticoagulation protocol: Yes   Plan:    Coumadin 2.5mg  x1 today  Then probably restart home dose warfarin 5mg  daily tomorrow -Daily PT/INR -  Bonnita Nasuti Pharm.D. CPP, BCPS Clinical Pharmacist [phone] 07/04/2015 8:06 AM    PM Addendum  Patient c/o chest pain then desat > now intubated Hold warfarin Heparin wt 77 kg Start heparin drip 1000 uts/hr Draw HL 6hr after start and daily  Bonnita Nasuti Pharm.D. CPP, BCPS Clinical Pharmacist [phone] 07/04/2015 1:58 PM

## 2015-07-04 NOTE — Progress Notes (Signed)
Transferred to [facility] by bed, report given to RN.

## 2015-07-04 NOTE — Progress Notes (Signed)
  Responded to Code Blue.   Patient developed CP and acute respiratory distress. ECG unremarkable. Progressive hypoxemia and obtundation. Sats in 70-80s despite 100% NRB. Did not lose pulse.   We provided bag mask ventilation. Oral airway inserted but patient unable to tolerate. Patient with episode of emesis requiring suctioning.   Intubated emergently by anesthesia service.   CXR pending. Suspect possible flash pulmonary edema. Will also cycle troponins. CCM consulted.   The patient is critically ill with multiple organ systems failure and requires high complexity decision making for assessment and support, frequent evaluation and titration of therapies, application of advanced monitoring technologies and extensive interpretation of multiple databases.   Critical Care Time devoted to patient care services described in this note is 35 Minutes.  Zilphia Kozinski,MD 4:18 PM

## 2015-07-04 NOTE — Progress Notes (Signed)
[place] for coumadin > heparin Indication: afib  Allergies  Allergen Reactions  . Flagyl [Metronidazole] Swelling and Rash    Face swells   . Contrast Media [Iodinated Diagnostic Agents] Rash  . Ioxaglate Rash  . Potassium Sulfate Rash and Other (See Comments)    Headaches  . Potassium-Containing Compounds Other (See Comments)    Headaches-- reports no problems now    Patient Measurements: Height: 5' 5" (165.1 cm) Weight: 187 lb 6.3 oz (85 kg) IBW/kg (Calculated) : 57 Heparin Dosing Weight: 74.7kg  Vital Signs: Temp: 97.4 F (36.3 C) (04/11 1654) Temp Source: Oral (04/11 1654) BP: 102/62 mmHg (04/11 1945) Pulse Rate: 139 (04/11 1735)  Labs:  Recent Labs  07/02/15 0244 07/03/15 0528 07/04/15 0400 07/04/15 1410 07/04/15 1545 07/04/15 2043 07/04/15 2104  HGB 8.2* 8.6*  --  10.0*  --   --   --   HCT 26.8* 27.2*  --  32.1*  --   --   --   PLT 180 181  --  237  --   --   --   LABPROT 16.4* 17.2* 19.8*  --   --   --   --   INR 1.31 1.40 1.68*  --   --   --   --   HEPARINUNFRC  --   --   --   --   --   --  0.27*  CREATININE 2.74* 2.39* 1.85* 2.15*  --   --   --   TROPONINI  --   --   --   --  0.08* 0.49*  --     Estimated Creatinine Clearance: 28.8 mL/min (by C-G formula based on Cr of 2.15).    Scheduled:  . albuterol  2.5 mg Nebulization TID  . [START ON 07/05/2015] allopurinol  100 mg Per Tube Daily  . antiseptic oral rinse  7 mL Mouth Rinse QID  . [START ON 07/05/2015] aspirin  81 mg Per Tube Daily  . atorvastatin  40 mg Oral q1800  . chlorhexidine gluconate (SAGE KIT)  15 mL Mouth Rinse BID  . colchicine  0.3 mg Oral Daily  . [START ON 07/05/2015] famotidine  20 mg Per Tube Daily  . feeding supplement (GLUCERNA SHAKE)  237 mL Oral BID BM  . ferrous sulfate  300 mg Per Tube BID WC  . furosemide  40 mg Intravenous BID  . insulin aspart  0-9 Units Subcutaneous 6 times per day  . [START ON 07/05/2015] levothyroxine  50  mcg Intravenous Daily  . piperacillin-tazobactam (ZOSYN)  IV  3.375 g Intravenous 3 times per day  . potassium chloride  40 mEq Oral BID  . sodium chloride flush  10-40 mL Intracatheter Q12H  . sodium chloride flush  3 mL Intravenous Q12H  . sodium chloride flush  3 mL Intravenous Q12H    Assessment: 65 yo female here with CP and with history of CAD with PCI.  She is on coumadin PTA for afib (CHADSVASC= 7/9). Pharmacy consulted to dose coumadin. L/RHC when INR < 1.8 received 2 doses Vit K and cath 4/7  INR 1.3> 1.4>1.7 decent jump overnight will back down dose to prevent over shoot. CBC stable  Initial HL is slightly subtherapeutic at 0.27 on heparin 1000 units/hr. Nurse reports no issues with infusion or bleeding. Pt had code blue called for multiple bouts of VT.  Home warfarin 3m daily  Goal of Therapy:  INR 2-3 Monitor platelets by anticoagulation protocol:  Yes   Plan:  Increase heparin to 1100 units/hr Daily HL/CBC    M. , PharmD, BCPS Clinical Pharmacist Pager [phone]  07/04/2015 9:41 PM    

## 2015-07-04 NOTE — Progress Notes (Signed)
Became agitated, daughter at bedside,coughed out small amount of bright red  blood,  Desats to 30's on 4l [place] EKG done, result seen by MD, called for code.  Seen byDr. Broadus John and Dr. Haroldine Laws, mouth suctioned with food particles, Intubated by anesthesia  Placed on a vent. Became agitated, fentanyl and versed given.

## 2015-07-04 NOTE — Progress Notes (Signed)
   07/04/15 1700  Clinical Encounter Type  Visited With Patient and family together;Health care provider  Visit Type Psychological support;Spiritual support;Social support;Code;Critical Care  Referral From Care management  Consult/Referral To Faith community  Spiritual Encounters  Spiritual Needs Prayer;Emotional  Stress Factors  Patient Stress Factors None identified  Family Stress Factors Exhausted;Health changes;Loss of control;Major life changes   Chaplain was page to a code [place] on 2-H room 11. Chaplain spoke with family and provided emotional and spiritual care via prayer and religious literature.

## 2015-07-04 NOTE — Progress Notes (Signed)
Pharmacy Antibiotic Note  [name] Terry is a 65 y.o. female admitted on 07/12/2015 with CP taken to cath lab.  Pharmacy has been consulted for zosyn dosing after desat and aspiration during intubation.  Plan: Zosyn 3.375 GM IV q8 EI  Height: 5\' 5"  (165.1 cm) Weight: 187 lb 6.3 oz (85 kg) IBW/kg (Calculated) : 57  Temp (24hrs), Avg:98.8 F (37.1 C), Min:98.6 F (37 C), Max:99.1 F (37.3 C)   Recent Labs Lab 07/02/2015 0340 07/01/15 0246 07/02/15 0244 07/03/15 0528 07/04/15 0400 07/04/15 1410  WBC 3.6* 5.2 4.9 4.3  --  11.1*  CREATININE 2.25* 2.35* 2.74* 2.39* 1.85*  --     Estimated Creatinine Clearance: 33.5 mL/min (by C-G formula based on Cr of 1.85).    Allergies  Allergen Reactions  . Flagyl [Metronidazole] Swelling and Rash    Face swells   . Contrast Media [Iodinated Diagnostic Agents] Rash  . Ioxaglate Rash  . Potassium Sulfate Rash and Other (See Comments)    Headaches  . Potassium-Containing Compounds Other (See Comments)    Headaches-- reports no problems now    Antimicrobials this admission: Zosyn 4/11>  Dose adjustments this admission:   Microbiology results:  Previous Cdiff AG + but  toxin neg  Thank you for allowing pharmacy to be a part of this patient's care.   Bonnita Nasuti Pharm.D. CPP, BCPS Clinical Pharmacist [phone] 07/04/2015 2:55 PM

## 2015-07-04 NOTE — Progress Notes (Signed)
    I met with pts daughters.  Following family meeting among siblings, they have requested, based on pts request from earlier this evening, that pt be a limited Code with CPR, Meds, & respiratory support, but no external defibrillation.  Order placed.  Pt currently hemodynamically stable w/o further VT - on IV amio and lido.  Murray Hodgkins, NP

## 2015-07-04 NOTE — Progress Notes (Addendum)
Called to see patient due to cardiac arrest.  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  She has been followed by AHF and was placed on Milrinone 4/9. This am Coox was 62.4% so plan was to stop Milrinone.  This afternoon went into acute respiratory distress and code blue was called but no CPR was done and patient was intubated.  She was felt to have flash pulmonary edema.  She was hypotensive after and was started on Dopamine.    She became tachycardic most likely from the dopamine with HR in the 130's.  Her sinus tachycardia fell into the slow VT zone and triggered therapy with ATP which broke and she had 1 normal beat and then went into ventricular tachycardia which broke on its own.  This became a cycle of persistent ST resulting in ATP and then ventricular tachcyardia.  She never received a shock from her device.  She was shocked externally 7 times.  She is now in NSR at 89bpm after IV Amio bolus and 150mg  and will be placed on a gtt.  Discussed case with Dr. Lovena Le who recommends continuing current therapy.  Since she had CP and presumed flash pulmonary edema early I am concerned she could be having ischemia.  I think the initial insult was triggering her slow VT zone due to sinus tach but with multiple rounds of ATP she became ischemic.  Will give a bolus of Lidcaine and also place on Lidocaine gtt in case this has an ischemic component.    Of note, she is completely awake now and her family asked if she wanted to be shocked anymore and she said no.  I have discussed this with the family.  Her daughter wishes for her to be a limited code with no shock but they still want treatment with meds of CPR if she would arrest.  She will sign the paperwork once her other sister arrives in the next 30 minutes to make sure she is in agreement.   The  patient is critically ill with multiple organ systems failure and requires high complexity decision making for assessment and support, frequent evaluation and titration of therapies, application of advanced monitoring technologies and extensive interpretation of multiple databases. Critical Care Time devoted to patient care services described in this note independent of APP time is 60 minutes with >50% of time spent in direct patient care.

## 2015-07-04 NOTE — Progress Notes (Signed)
Complained of another chest pain , offerred morphine but refused , percocet 1 tab po given. Cont. To monitor.

## 2015-07-04 NOTE — Transfer of Care (Signed)
Immediate Anesthesia Transfer of Care Note  Patient: [name] Terry  Procedure(s) Performed: * No procedures listed *  Patient Location: Nursing Unit  Anesthesia Type:Stat intubation  Level of Consciousness: sedated, unresponsive and Patient remains intubated per anesthesia plan  Airway & Oxygen Therapy: Patient remains intubated per anesthesia plan and Patient placed on Ventilator (see vital sign flow sheet for setting)  Post-op Assessment: Report given to RN and Post -op Vital signs reviewed and stable  Post vital signs: Reviewed and stable  Last Vitals:  Filed Vitals:   07/04/15 0833 07/04/15 1144  BP: 144/90 154/95  Pulse: 91 102  Temp: 37.1 C 37.3 C  Resp: 19 36    Complications: No apparent anesthesia complications

## 2015-07-04 NOTE — Procedures (Signed)
Bronchoscopy Procedure Note [name] Terry [id number] 12/14/1950  Procedure: Bronchoscopy Indications: Diagnostic evaluation of the airways, Obtain specimens for culture and/or other diagnostic studies and Remove secretions  Procedure Details Consent: Unable to obtain consent because of emergent medical necessity. Time Out: Verified patient identification, verified procedure, site/side was marked, verified correct patient position, special equipment/implants available, medications/allergies/relevent history reviewed, required imaging and test results available.  Performed  In preparation for procedure, patient was given 100% FiO2 and bronchoscope lubricated. Sedation: Muscle relaxants and Etomidate  Airway entered and the following bronchi were examined: RUL, RML, RLL, LUL, LLL and Bronchi.   R mainstem hemorrhage. Bronchoscope removed.    Evaluation Hemodynamic Status: BP stable throughout; O2 sats: stable throughout Patient's Current Condition: stable Specimens:  Sent serosanguinous fluid Complications: No apparent complications Patient did tolerate procedure well.   Jennet Maduro 07/04/2015

## 2015-07-04 NOTE — Code Documentation (Signed)
  Patient Name: [name] Terry   MRN: [id number]   Date of Birth/ Sex: Jul 29, 1950 , female      Admission Date: 06/30/2015  Attending Provider: Domenic Polite, MD  Primary Diagnosis: Chest pain   Indication: Pt was in her usual state of health until this PM, when she was noted to be in ventricular tachycardia. Code blue was subsequently called. At the time of arrival on scene, ACLS protocol was underway.   Technical Description:  - CPR performance duration:  None, patient had pulse  - Was defibrillation or cardioversion used? Yes  - Was external pacer placed? No  - Was patient intubated pre/post CPR? Yes   Medications Administered: Y = Yes; Blank = No Amiodarone  Y  Atropine    Calcium    Epinephrine    Lidocaine    Magnesium    Norepinephrine    Phenylephrine    Sodium bicarbonate    Vasopressin     Post CPR evaluation:  - Final Status - Was patient successfully resuscitated ? Yes - What is current rhythm? Sinus Tachycardia - What is current hemodynamic status? Stable  Miscellaneous Information:  - Labs sent, including: Yes  - Primary team notified?  Yes  - Family Notified? Yes        Liberty Handy, MD  07/04/2015, 6:16 PM

## 2015-07-04 NOTE — Progress Notes (Signed)
RN called to room by peers stating pt in VT on monitor. Upon assessment pt VT on monitor, AICD pacing pt out of rhythm. Pt continues to go in and out of VT on the monitor, Elink MD notified, cardiology paged. Pt placed on zoll at bedside. For further details see code sheet and MD notes.

## 2015-07-04 NOTE — Consult Note (Signed)
PULMONARY / CRITICAL CARE MEDICINE   Name: [name] Terry MRN: [id number] DOB: [birth year]    ADMISSION DATE:  07/17/2015 CONSULTATION DATE:  07/04/15  REFERRING MD:  Fanny Bien  CHIEF COMPLAINT:  Respiratory arrest  HISTORY OF PRESENT ILLNESS:    [name] Terry is 65 yo female with past medical history significant for ischemic cardiomyopathy, stroke, coronary artery disease, lymphoma, CHF  S/p ICD placement ,HTN,HLD, Venticular tachycardia,chronic kidney disease -stage 3, depression, GERD.  Patient presented to ED with chest pain and LHC was not indicative of PCI at the time and was medically managed. On 4/11 approximately during afternoon patient started complaining of chest pain, 8/10. BP was 154/94.  Patient was given 2 doses of nitroglycerin and xanax and patient experienced some releif.  Almost an hour later patient started complaining of another episode of chest pain, she was offered with morphine but she refused. Patient took 1 tablet of percocet.  Patient started having shortness of breath and ended up in  respiratory arrest.  Patient never lost her pulse and ended up getting intubated , now on vent.  PAST MEDICAL HISTORY :  She  has a past medical history of Ischemic cardiomyopathy; History of stroke ( ); Coronary artery disease; Lymphoma ([place]); Chronic systolic CHF (congestive heart failure) ([place]); HTN (hypertension); HLD (hyperlipidemia); Ventricular tachycardia ([place]); DM2 (diabetes mellitus, type 2), newly diagnosed ( ); CKD (chronic kidney disease) stage 3, GFR 30-59 ml/min ( ); Polymyalgia rheumatica ([place]); Depression; GERD (gastroesophageal reflux disease); Renal atrophy, left; Mitral regurgitation; Diverticulosis; PAF (paroxysmal atrial fibrillation) ([place]); Hiatal hernia; Gastritis; and Anxiousness (03/02/2015).  PAST SURGICAL HISTORY: She  has past surgical history that includes Cardiac defibrillator placement (03/2011); Coronary angioplasty with stent; left and right heart catheterization  with coronary angiogram (N/A, 05/03/2013); v-tach ablation (N/A, 05/26/2014); Cardiac catheterization (N/A, 08/04/2014); Esophagogastroduodenoscopy (egd) with propofol (N/A, 04/03/2015); Colonoscopy (N/A, 04/07/2015); pacemaker/defibrillator; Cardiac catheterization (N/A, 06/25/2015); and TEE without cardioversion (N/A, 06/25/2015).  Allergies  Allergen Reactions  . Flagyl [Metronidazole] Swelling and Rash    Face swells   . Contrast Media [Iodinated Diagnostic Agents] Rash  . Ioxaglate Rash  . Potassium Sulfate Rash and Other (See Comments)    Headaches  . Potassium-Containing Compounds Other (See Comments)    Headaches-- reports no problems now    No current facility-administered medications on file prior to encounter.   Current Outpatient Prescriptions on File Prior to Encounter  Medication Sig  . ALPRAZolam (XANAX) 0.25 MG tablet Take 1-2 tablets (0.25-0.5 mg total) by mouth 3 (three) times daily as needed for anxiety.  Marland Kitchen amiodarone (PACERONE) 200 MG tablet Take 1 tablet (200 mg total) by mouth 2 (two) times daily. (Patient taking differently: Take 200 mg by mouth daily. )  . atorvastatin (LIPITOR) 40 MG tablet Take 1 tablet (40 mg total) by mouth daily at 6 PM.  . carvedilol (COREG) 6.25 MG tablet Take 1 tablet (6.25 mg total) by mouth 2 (two) times daily with a meal.  . levothyroxine (SYNTHROID, LEVOTHROID) 50 MCG tablet Take 1 tablet (50 mcg total) by mouth daily before breakfast. (Patient taking differently: Take 75 mcg by mouth daily before breakfast. )  . Multiple Vitamin (MULTI VITAMIN DAILY PO) Take 1 tablet by mouth daily.  . nitroGLYCERIN (NITROSTAT) 0.4 MG SL tablet Place 1 tablet (0.4 mg total) under the tongue every 5 (five) minutes as needed for chest pain.  Marland Kitchen oxyCODONE-acetaminophen (PERCOCET/ROXICET) 5-325 MG tablet Take 1 tablet by mouth every 8 (eight) hours as needed for severe pain.  Marland Kitchen  pantoprazole (PROTONIX) 40 MG tablet Take 1 tablet (40 mg total) by mouth 2 (two) times  daily. (Patient taking differently: Take 40 mg by mouth daily. )  . PARoxetine (PAXIL) 10 MG tablet Take 1 tablet (10 mg total) by mouth daily.  . potassium chloride SA (K-DUR,KLOR-CON) 20 MEQ tablet Take 20 mEq by mouth 3 (three) times a week. Monday, Wednesday and Friday  . senna-docusate (SENOKOT-S) 8.6-50 MG tablet Take 1 tablet by mouth 2 (two) times daily as needed for mild constipation.   . torsemide (DEMADEX) 20 MG tablet Take 1 tablet (20 mg total) by mouth daily. (Patient taking differently: Take 20 mg by mouth 3 (three) times a week. Monday, Wednesday and Friday)    FAMILY HISTORY:  Her indicated that her mother is deceased. She indicated that her father is deceased. She indicated that all of her five sisters are alive. She indicated that four of her six brothers are alive.   SOCIAL HISTORY: She  reports that she quit smoking about 13 months ago. She has never used smokeless tobacco. She reports that she does not drink alcohol or use illicit drugs.  REVIEW OF SYSTEMS:   Unable to obtain  SUBJECTIVE:  Unable to obtain  VITAL SIGNS: BP 154/95 mmHg  Pulse 102  Temp(Src) 99.1 F (37.3 C) (Oral)  Resp 36  Ht 5\' 5"  (1.651 m)  Wt 187 lb 6.3 oz (85 kg)  BMI 31.18 kg/m2  SpO2 92%  HEMODYNAMICS: CVP:  [7 mmHg-10 mmHg] 10 mmHg  VENTILATOR SETTINGS: Vent Mode:  [-] PRVC FiO2 (%):  [100 %] 100 % Set Rate:  [16 bmp] 16 bmp Vt Set:  [500 mL] 500 mL PEEP:  [12 cmH20] 12 cmH20  INTAKE / OUTPUT: I/O last 3 completed shifts: In: 1459.5 [P.O.:840; I.V.:519.5; IV Piggyback:100] Out: 750 [Urine:750]  PHYSICAL EXAMINATION: General: Sickly appearing black female, now intubated  Neuro: obtunded HEENT:  Atraumatic, normocephalic,  Cardiovascular: S1S2, tachycardic, no MRG noted Lungs:crackles bilaterally, no wheezes, rhonchi Abdomen:  Obese, round, BS positive Musculoskeletal:  No inflammation, deformity Skin:  Grossly intact  LABS:  BMET  Recent Labs Lab 07/02/15 0244  07/03/15 0528 07/04/15 0400  NA 136 139 137  K 3.6 2.9* 3.2*  CL 100* 99* 101  CO2 26 29 27   BUN 28* 25* 18  CREATININE 2.74* 2.39* 1.85*  GLUCOSE 113* 120* 86    Electrolytes  Recent Labs Lab 06/28/15 0418  07/02/15 0244 07/03/15 0528 07/04/15 0400  CALCIUM 9.0  < > 8.6* 8.2* 8.1*  MG 1.9  --   --  1.6* 1.8  < > = values in this interval not displayed.  CBC  Recent Labs Lab 07/01/15 0246 07/02/15 0244 07/03/15 0528  WBC 5.2 4.9 4.3  HGB 8.6* 8.2* 8.6*  HCT 27.9* 26.8* 27.2*  PLT 180 180 181    Coag's  Recent Labs Lab 07/02/15 0244 07/03/15 0528 07/04/15 0400  INR 1.31 1.40 1.68*    Sepsis Markers No results for input(s): LATICACIDVEN, PROCALCITON, O2SATVEN in the last 168 hours.  ABG  Recent Labs Lab 07/13/2015 0851  PHART 7.403  PCO2ART 43.5  PO2ART 169.0*    Liver Enzymes  Recent Labs Lab 06/28/15 0418  AST 41  ALT 28  ALKPHOS 61  BILITOT 0.8  ALBUMIN 2.6*    Cardiac Enzymes  Recent Labs Lab 06/27/15 1650  TROPONINI 0.05*    Glucose  Recent Labs Lab 07/03/15 0748 07/03/15 1144 07/03/15 1502 07/03/15 2138 07/04/15 0800 07/04/15 1139  GLUCAP  98 134* 129* 142* 81 109*    Imaging No results found.   STUDIES:  4/6 TEE 4/7 RHC And Coronary angiography Distal LAD , 30% stenosed, proximal Circumflex lesion 405 stenosed, proximal circumflex to mid circumflex 50% stenosed,1st Mrg lesion, 75% stenosed,1st RPLB lesion, 99% stenosed.  CULTURES: 4/11 [facility] 4/11 sputum 4/11 urine  ANTIBIOTICS:  4/11 zosyn>>  SIGNIFICANT EVENTS:  LINES/TUBES: PICC>>  4/11 ET   DISCUSSION: 65 YO female with past medical Hx of  ischemic cardiomyopathy, stroke, coronary artery disease, lymphoma, CHF  S/p ICD placement ,HTN,HLD, Venticular tachycardia,chronic kidney disease -stage 3, depression, GERD, NSTEMI, had repiratory arrested,intubated and currently on vent  ASSESSMENT / PLAN:  PULMONARY A: Acute Hypoxemic respiratory  failure Pulmonary edema ?Aspiration  P:   PRVC mode ventilation , wean as tolerated PAN cultures Routine ABG BAL for fungus CXR in am Diurese Fentanly/versed for sedation Zosyn for asp. PNA duoneb/albuterol CBC, Chem in am     CARDIOVASCULAR A:  NSTEMI Chronic systolic CHF Hx of CAD, S/P stenting PAF P:  Continue Milirinone per cards Start heparin gtt  Hold oral coumadin Trend troponin  rest per primary  RENAL A:   chronic kidney disease -stage 3 Increased Anion gap acidosis Hyponatremia   P:   Trend chemistry Electrolyte replacement per unit protocol  GASTROINTESTINAL A:   C-DIFF- Antigen+, toxin -negative -repeat pending GERD P:  NPO Gastric lavage to r/o  GI bleeding  Enteric precations - protonix for GIP  HEMATOLOGIC A:   No active issues P:  scd's Heparin DVT prophylaxis Transfuse if HgB<7  INFECTIOUS A:   Aspiration PNA P:   PAN culture Antibiotics as above  ENDOCRINE A:   Diabetese Hypothyroidism P:   SSI coverage Levothyroxine I/V formulary  NEUROLOGIC A:   Hx OF Depression P:   RASS goa 0 -1 Hold paxil/seroquel   FAMILY  - Updates: Family updated by Dr.Briona Korpela  - Inter-disciplinary family meet or Palliative Care meeting due by: 07/10/15    Bincy Varughese,AG-ACNP Pulmonary & Critical Care  Attending Note:  65 year old female with extensive PMH who presents after a respiratory arrest where patient was intubated by anesthesia and PCCM was called for vent management. Cards following and patient is on milrinone 0.125 and PO lasix 80 mg/day. On 4/11 the patient deteriorated and intubated. Aspiration during intubation. On exam, coarse BS diffusely. I reviewed CXR, cardiomegaly and pulmonary edema. Will maintain on full vent support, start zosyn, pan culture, f/u ABG and CXR, continue milrinone, if stomach without bleeding will continue heparin, f/u on cultures and keep sedated in the ICU until able to diurese. No  family present bedside.  The patient is critically ill with multiple organ systems failure and requires high complexity decision making for assessment and support, frequent evaluation and titration of therapies, application of advanced monitoring technologies and extensive interpretation of multiple databases.   Critical Care Time devoted to patient care services described in this note is 35 Minutes. This time reflects time of care of this signee Dr Jennet Maduro. This critical care time does not reflect procedure time, or teaching time or supervisory time of PA/NP/Med student/Med Resident etc but could involve care discussion time.  Rush Farmer, M.D. [facility] Pulmonary/Critical Care Medicine. Pager: [phone]. After hours pager: [phone].

## 2015-07-05 DIAGNOSIS — I469 Cardiac arrest, cause unspecified: Secondary | ICD-10-CM

## 2015-07-05 DIAGNOSIS — I5043 Acute on chronic combined systolic (congestive) and diastolic (congestive) heart failure: Secondary | ICD-10-CM

## 2015-07-05 DIAGNOSIS — I472 Ventricular tachycardia: Secondary | ICD-10-CM

## 2015-07-05 DIAGNOSIS — J9621 Acute and chronic respiratory failure with hypoxia: Secondary | ICD-10-CM

## 2015-07-05 LAB — CARBOXYHEMOGLOBIN
CARBOXYHEMOGLOBIN: 1.1 % (ref 0.5–1.5)
METHEMOGLOBIN: 0.9 % (ref 0.0–1.5)
O2 Saturation: 62.4 %
Total hemoglobin: 8.7 g/dL — ABNORMAL LOW (ref 12.0–16.0)

## 2015-07-05 LAB — COMPREHENSIVE METABOLIC PANEL
ALK PHOS: 66 U/L (ref 38–126)
ALT: 389 U/L — AB (ref 14–54)
AST: 620 U/L — ABNORMAL HIGH (ref 15–41)
Albumin: 1.9 g/dL — ABNORMAL LOW (ref 3.5–5.0)
Anion gap: 13 (ref 5–15)
BUN: 20 mg/dL (ref 6–20)
CALCIUM: 8.1 mg/dL — AB (ref 8.9–10.3)
CHLORIDE: 97 mmol/L — AB (ref 101–111)
CO2: 23 mmol/L (ref 22–32)
CREATININE: 2.39 mg/dL — AB (ref 0.44–1.00)
GFR, EST AFRICAN AMERICAN: 24 mL/min — AB (ref >60)
GFR, EST NON AFRICAN AMERICAN: 20 mL/min — AB (ref >60)
Glucose, Bld: 213 mg/dL — ABNORMAL HIGH (ref 65–99)
Potassium: 4 mmol/L (ref 3.5–5.1)
Sodium: 133 mmol/L — ABNORMAL LOW (ref 135–145)
Total Bilirubin: 1 mg/dL (ref 0.3–1.2)
Total Protein: 5 g/dL — ABNORMAL LOW (ref 6.5–8.1)

## 2015-07-05 LAB — POCT I-STAT 3, ART BLOOD GAS (G3+)
Acid-Base Excess: 1 mmol/L (ref 0.0–2.0)
Bicarbonate: 23.5 mEq/L (ref 20.0–24.0)
O2 Saturation: 98 %
PCO2 ART: 27.8 mmHg — AB (ref 35.0–45.0)
PH ART: 7.535 — AB (ref 7.350–7.450)
PO2 ART: 93 mmHg (ref 80.0–100.0)
TCO2: 24 mmol/L (ref 0–100)

## 2015-07-05 LAB — PROTIME-INR
INR: 2.47 — ABNORMAL HIGH (ref 0.00–1.49)
Prothrombin Time: 26.4 s — ABNORMAL HIGH (ref 11.6–15.2)

## 2015-07-05 LAB — GLUCOSE, CAPILLARY
GLUCOSE-CAPILLARY: 192 mg/dL — AB (ref 65–99)
GLUCOSE-CAPILLARY: 89 mg/dL (ref 65–99)
Glucose-Capillary: 389 mg/dL — ABNORMAL HIGH (ref 65–99)
Glucose-Capillary: 81 mg/dL (ref 65–99)
Glucose-Capillary: 106 mg/dL — ABNORMAL HIGH (ref 65–99)
Glucose-Capillary: 97 mg/dL (ref 65–99)

## 2015-07-05 LAB — CBC
HCT: 26 % — ABNORMAL LOW (ref 36.0–46.0)
Hemoglobin: 8.7 g/dL — ABNORMAL LOW (ref 12.0–15.0)
MCH: 30.3 pg (ref 26.0–34.0)
MCHC: 33.5 g/dL (ref 30.0–36.0)
MCV: 90.6 fL (ref 78.0–100.0)
Platelets: 180 K/uL (ref 150–400)
RBC: 2.87 MIL/uL — ABNORMAL LOW (ref 3.87–5.11)
RDW: 18.2 % — ABNORMAL HIGH (ref 11.5–15.5)
WBC: 8.7 K/uL (ref 4.0–10.5)

## 2015-07-05 LAB — TROPONIN I: Troponin I: 1.18 ng/mL (ref <0.031)

## 2015-07-05 LAB — HEPARIN LEVEL (UNFRACTIONATED)
Heparin Unfractionated: <0.1 [IU]/mL — ABNORMAL LOW (ref 0.30–0.70)
Heparin Unfractionated: <0.1 IU/mL — ABNORMAL LOW (ref 0.30–0.70)

## 2015-07-05 MED ORDER — PRO-STAT SUGAR FREE PO LIQD
30.0000 mL | Freq: Two times a day (BID) | ORAL | Status: DC
  Administered 2015-07-05 – 2015-07-14 (×17): 30 mL
  Filled 2015-07-05 (×17): qty 30

## 2015-07-05 MED ORDER — SODIUM CHLORIDE 0.9 % IV SOLN
INTRAVENOUS | Status: DC
  Administered 2015-07-08: 22:00:00 via INTRAVENOUS
  Administered 2015-07-13: 10 mL/h via INTRAVENOUS

## 2015-07-05 MED ORDER — LEVOTHYROXINE SODIUM 100 MCG PO TABS
100.0000 ug | ORAL_TABLET | Freq: Every day | ORAL | Status: DC
  Administered 2015-07-06 – 2015-07-17 (×12): 100 ug
  Filled 2015-07-05 (×13): qty 1

## 2015-07-05 MED ORDER — VITAL HIGH PROTEIN PO LIQD
1000.0000 mL | ORAL | Status: DC
  Administered 2015-07-05: 23:00:00
  Administered 2015-07-05: 1000 mL
  Administered 2015-07-06: 20:00:00
  Administered 2015-07-06: 1000 mL
  Administered 2015-07-06: 06:00:00
  Administered 2015-07-07: 1000 mL
  Administered 2015-07-07: 20:00:00
  Administered 2015-07-08: 1000 mL
  Administered 2015-07-09 (×2)
  Administered 2015-07-09 – 2015-07-10 (×2): 1000 mL
  Administered 2015-07-10 – 2015-07-11 (×2)
  Filled 2015-07-05 (×3): qty 1000

## 2015-07-05 NOTE — Progress Notes (Signed)
PULMONARY / CRITICAL CARE MEDICINE   Name: [name] Terry MRN: [id number] DOB: 06/20/1950    ADMISSION DATE:  06/25/2015 CONSULTATION DATE:  07/04/15  REFERRING MD:  Fanny Bien  CHIEF COMPLAINT:  Respiratory arrest  HISTORY OF PRESENT ILLNESS:    [name] Terry is 65 yo female with past medical history significant for ischemic cardiomyopathy, stroke, coronary artery disease, lymphoma, CHF  S/p ICD placement ,HTN,HLD, Venticular tachycardia,chronic kidney disease -stage 3, depression, GERD.  Patient presented to ED with chest pain and LHC was not indicative of PCI at the time and was medically managed. On 4/11 approximately during afternoon patient started complaining of chest pain, 8/10. BP was 154/94.  Patient was given 2 doses of nitroglycerin and xanax and patient experienced some releif.  Almost an hour later patient started complaining of another episode of chest pain, she was offered with morphine but she refused. Patient took 1 tablet of percocet.  Patient started having shortness of breath and ended up in  respiratory arrest.  Patient never lost her pulse and ended up getting intubated , now on vent.  SUBJECTIVE:  Multiple cardiac arrests overnight.  Family meeting, no further defibrillation but CPR ok.  VITAL SIGNS: BP 111/69 mmHg  Pulse 62  Temp(Src) 98.3 F (36.8 C) (Oral)  Resp 20  Ht 5\' 5"  (1.651 m)  Wt 86.5 kg (190 lb 11.2 oz)  BMI 31.73 kg/m2  SpO2 100%  HEMODYNAMICS: CVP:  [11 mmHg] 11 mmHg  VENTILATOR SETTINGS: Vent Mode:  [-] PRVC FiO2 (%):  [60 %-100 %] 100 % Set Rate:  [16 bmp-20 bmp] 20 bmp Vt Set:  [500 mL] 500 mL PEEP:  [10 cmH20-12 cmH20] 10 cmH20 Plateau Pressure:  [27 cmH20-30 cmH20] 27 cmH20  INTAKE / OUTPUT: I/O last 3 completed shifts: In: 1776.1 [P.O.:100; I.V.:1301.1; NG/GT:175; IV Piggyback:200] Out: 670 [Urine:520; Emesis/NG output:150]  PHYSICAL EXAMINATION: General: Sickly appearing black female, intubated and unresponsive. Neuro:  obtunded HEENT:  Atraumatic, normocephalic,  Cardiovascular: S1S2, tachycardic, no MRG noted Lungs:crackles bilaterally, no wheezes, rhonchi Abdomen:  Obese, round, BS positive Musculoskeletal:  No inflammation, deformity Skin:  Grossly intact  LABS:  BMET  Recent Labs Lab 07/04/15 0400 07/04/15 1410 07/05/15 0530  NA 137 134* 133*  K 3.2* 3.9 4.0  CL 101 96* 97*  CO2 27 22 23   BUN 18 16 20   CREATININE 1.85* 2.15* 2.39*  GLUCOSE 86 235* 213*    Electrolytes  Recent Labs Lab 07/03/15 0528 07/04/15 0400 07/04/15 1410 07/05/15 0530  CALCIUM 8.2* 8.1* 8.2* 8.1*  MG 1.6* 1.8 2.4  --     CBC  Recent Labs Lab 07/03/15 0528 07/04/15 1410 07/05/15 0530  WBC 4.3 11.1* 8.7  HGB 8.6* 10.0* 8.7*  HCT 27.2* 32.1* 26.0*  PLT 181 237 180    Coag's  Recent Labs Lab 07/03/15 0528 07/04/15 0400 07/05/15 0530  INR 1.40 1.68* 2.47*    Sepsis Markers No results for input(s): LATICACIDVEN, PROCALCITON, O2SATVEN in the last 168 hours.  ABG  Recent Labs Lab 07/07/2015 0851 07/04/15 1456  PHART 7.403 7.408  PCO2ART 43.5 43.9  PO2ART 169.0* 274.0*    Liver Enzymes  Recent Labs Lab 07/04/15 1410 07/05/15 0530  AST 96* 620*  ALT 55* 389*  ALKPHOS 67 66  BILITOT 0.8 1.0  ALBUMIN 2.5* 1.9*    Cardiac Enzymes  Recent Labs Lab 07/04/15 1545 07/04/15 2043 07/05/15 0400  TROPONINI 0.08* 0.49* 1.18*    Glucose  Recent Labs Lab 07/04/15 1139 07/04/15 1528  07/04/15 2007 07/05/15 0015 07/05/15 0409 07/05/15 0759  GLUCAP 109* 179* 240* 192* 389* 81    Imaging Dg Chest Port 1 View  07/04/2015  CLINICAL DATA:  65 year old female intubated.   Initial encounter. EXAM: PORTABLE CHEST 1 VIEW COMPARISON:  06/28/2015 chest radiographs. FINDINGS: Portable AP view at 1527 hours. Endotracheal tube in place, tip just below the clavicles. Enteric tube courses to the abdomen, tip not included. Stable left chest cardiac AICD. Right PICC line in place, tip at  the level of the carina. New confluent bilateral pulmonary airspace opacity, worse on the right. No superimposed pneumothorax or pleural effusion. Stable cardiac size and mediastinal contours. IMPRESSION: 1. Endotracheal tube in good position. Enteric tube courses the abdomen, tip not included. Right PICC line tip at the SVC level. 2. New widespread pulmonary airspace opacity, worse on the right. Top differential considerations include asymmetric pulmonary edema, severe bilateral pneumonia or aspiration. No pleural effusion identified. Electronically Signed   By: [name] Terry M.D.   On: 07/04/2015 16:36     STUDIES:  4/6 TEE 4/7 RHC And Coronary angiography Distal LAD , 30% stenosed, proximal Circumflex lesion 405 stenosed, proximal circumflex to mid circumflex 50% stenosed,1st Mrg lesion, 75% stenosed,1st RPLB lesion, 99% stenosed.  CULTURES: 4/11 [facility] 4/11 sputum 4/11 urine  ANTIBIOTICS:  4/11 zosyn>>  SIGNIFICANT EVENTS:  LINES/TUBES: PICC>>  4/11 ET   DISCUSSION: 65 YO female with past medical Hx of  ischemic cardiomyopathy, stroke, coronary artery disease, lymphoma, CHF  S/p ICD placement ,HTN,HLD, Venticular tachycardia,chronic kidney disease -stage 3, depression, GERD, NSTEMI, had repiratory arrested,intubated and currently on vent  ASSESSMENT / PLAN:  PULMONARY A: Acute Hypoxemic respiratory failure Pulmonary edema ?Aspiration  P:   PRVC mode ventilation , wean as tolerated PAN cultures and f/u. CXR in am Diurese Fentanly/versed for sedation Zosyn for asp. PNA Duoneb/albuterol CBC, Chem in am  CARDIOVASCULAR A:  NSTEMI Chronic systolic CHF Hx of CAD, S/P stenting PAF P:  Continue Milirinone per cards Heparin gtt per pharmacy Hold oral coumadin Trend troponin  RENAL A:   chronic kidney disease -stage 3 Increased Anion gap acidosis Hyponatremia   P:   BMET in AM. Electrolyte replacement as needed. KVO IVF.  GASTROINTESTINAL A:   C-DIFF- Antigen+,  toxin -negative -repeat pending GERD P:  TF per nutrition. C diff positive, repeat. Protonix for GIP  HEMATOLOGIC A:   No active issues P:  SCD's Heparin DVT prophylaxis Transfuse if HgB<7  INFECTIOUS A:   Aspiration PNA P:   PAN culture Antibiotics as above   ENDOCRINE A:   Diabetese Hypothyroidism P:   SSI coverage Levothyroxine I/V formulary  NEUROLOGIC A:   Hx OF Depression P:   RASS goa 0 -1 Hold paxil/seroquel  FAMILY  - Updates: Daughter updated bedside.  - Inter-disciplinary family meet or Palliative Care meeting due by: 07/10/15.  The patient is critically ill with multiple organ systems failure and requires high complexity decision making for assessment and support, frequent evaluation and titration of therapies, application of advanced monitoring technologies and extensive interpretation of multiple databases.   Critical Care Time devoted to patient care services described in this note is 35 Minutes. This time reflects time of care of this signee Dr Jennet Maduro. This critical care time does not reflect procedure time, or teaching time or supervisory time of PA/NP/Med student/Med Resident etc but could involve care discussion time.  Rush Farmer, M.D. [facility] Pulmonary/Critical Care Medicine. Pager: [phone]. After hours pager: [phone].

## 2015-07-05 NOTE — Progress Notes (Signed)
Nutrition Follow-up  INTERVENTION:   Initiate Vital High Protein @ 20 ml/hr via OG tube and increase by 10 ml every 8 hours to goal rate of 40 ml/hr.   30 ml Prostat BID.    Tube feeding regimen provides 1160 kcal, 114 grams of protein, and 802 ml of H2O.   NUTRITION DIAGNOSIS:   Inadequate oral intake related to inability to eat as evidenced by NPO status. Ongoing.   GOAL:   Provide needs based on ASPEN/SCCM guidelines Progressing  MONITOR:   Vent status, Labs, I & O's, TF tolerance  REASON FOR ASSESSMENT:   Consult Enteral/tube feeding initiation and management  ASSESSMENT:   [name] Terry is a 65 y.o. female with history of CAD status post stenting, chronic systolic heart failure status post ICD placement, proximal respiratory ablation, diabetes mellitus, chronic anemia, chronic kidney disease stage IV process to the ER because of chest pain. Patient has been having chest pain since yesterday morning  4/7 heart cath 4/9 IV milrinone 4/11 multiple arrests, intubated  Patient is currently intubated on ventilator support MV: 5.8 L/min Temp (24hrs), Avg:98.3 F (36.8 C), Min:97.4 F (36.3 C), Max:98.8 F (37.1 C)  Medications reviewed and include: ferrous sulfate, KCl Labs reviewed: sodium low 133, AST 620, ALT 389 C.diff pending OG tube Per RN pt was having some abdominal tenderness  Diet Order:  Diet NPO time specified  Skin:  Reviewed, no issues  Last BM:  4/11 c.diff pending  Height:   Ht Readings from Last 1 Encounters:  06/27/15 5\' 5"  (1.651 m)    Weight:   Wt Readings from Last 1 Encounters:  07/05/15 190 lb 11.2 oz (86.5 kg)    Ideal Body Weight:  56.81 kg  BMI:  Body mass index is 31.73 kg/(m^2).  Estimated Nutritional Needs:   Kcal:  [id number]  Protein:  >/=113 grams  Fluid:  >/= 1.6L  EDUCATION NEEDS:   No education needs identified at this time  [place], [place], [place] Pager [phone] After Hours Pager

## 2015-07-05 NOTE — Progress Notes (Signed)
PT Cancellation Note  Patient Details Name: [name] Terry MRN: [id number] DOB: [birth year]   Cancelled Treatment:    Reason Eval/Treat Not Completed: Medical issues which prohibited therapy;Patient not medically ready. R(please reorder as appropriate) Pt now intubated with sedation and code blue on 07/05/15.   Duncan Dull 07/05/2015, 1:45 PM Alben Deeds, [place] DPT  [phone]

## 2015-07-05 NOTE — Progress Notes (Signed)
CRITICAL VALUE ALERT  Critical value received:  Troponin 1.18  Date of notification:  07/05/15  Time of notification:  0510  Critical value read back:Yes.    Nurse who received alert:  Carin Primrose RN  MD notified (1st page):  S[place]  Time of first page:  0515  Responding MD:  Emmit Alexanders  Time MD responded:  Willie.Bhat  MD reviewed current medications while on phone. No new orders.

## 2015-07-05 NOTE — Progress Notes (Addendum)
ANTICOAGULATION CONSULT NOTE - Follow Up Consult  Pharmacy Consult for Heparin  Indication: atrial fibrillation  Allergies  Allergen Reactions  . Flagyl [Metronidazole] Swelling and Rash    Face swells   . Contrast Media [Iodinated Diagnostic Agents] Rash  . Ioxaglate Rash  . Potassium Sulfate Rash and Other (See Comments)    Headaches  . Potassium-Containing Compounds Other (See Comments)    Headaches-- reports no problems now    Patient Measurements: Height: 5\' 5"  (165.1 cm) Weight: 190 lb 11.2 oz (86.5 kg) IBW/kg (Calculated) : 57  Vital Signs: Temp: 98.8 F (37.1 C) (04/12 0000) Temp Source: Axillary (04/12 0000) BP: 106/58 mmHg (04/12 0100) Pulse Rate: 60 (04/12 0314)  Labs:  Recent Labs  07/03/15 0528 07/04/15 0400 07/04/15 1410 07/04/15 1545 07/04/15 2043 07/04/15 2104 07/05/15 0400 07/05/15 0530  HGB 8.6*  --  10.0*  --   --   --   --  8.7*  HCT 27.2*  --  32.1*  --   --   --   --  26.0*  PLT 181  --  237  --   --   --   --  180  LABPROT 17.2* 19.8*  --   --   --   --   --  26.4*  INR 1.40 1.68*  --   --   --   --   --  2.47*  HEPARINUNFRC  --   --   --   --   --  0.27*  --  <0.10*  CREATININE 2.39* 1.85* 2.15*  --   --   --   --  2.39*  TROPONINI  --   --   --  0.08* 0.49*  --  1.18*  --     Estimated Creatinine Clearance: 26.2 mL/min (by C-G formula based on Cr of 2.39).  Assessment: Heparin level is undetectable this AM, infusing ok per RN, INR did jump from 1.68>>2.47  Goal of Therapy:  Heparin level 0.3-0.7 units/ml Monitor platelets by anticoagulation protocol: Yes   Plan:  -Increase heparin to 1250 units/hr -1430 HL  Narda Bonds 07/05/2015,6:22 AM

## 2015-07-05 NOTE — Progress Notes (Signed)
Notification about Heparin levl. Checked IV site. Soft. No blood return.Saline Lock site for IV team to assess. Relocated heparin to Double lumen PICC site.

## 2015-07-05 NOTE — Progress Notes (Signed)
Advanced Heart Failure Rounding Note  PCP: Karna Dupes, PA-C Primary Cardiologist: Johnsie Cancel EP: Allred  Subjective:   This is a 65 year old female patient with h/o of CAD, HTN, ICM s/p MDT dual chamber ICD 2013, lymphoma s/p chemo/radiation 8338, chronic systolic CHF, status post VT ablation 2, HTN, HLD, VT, DM, CKD stage III, PMR and depression who presented to [facility] with CP and SOB  Started on empiric milrinone 07/02/15 with presentation concerning for low output.  PICC placed. Coox this am 62.4% on milrinone 0.125.  Yesterday she developed respiratory distress requiring intubation and bronchoscopy. After intubation she was hypotensive and started on dopamine and continued on milrinone. Unfortunately later that evening she had V fib arrest multiple shocks. Milrinone and dopamine stopped. Started on lidocaine and amio drip.   Todays CO-OX 62%. Creatinine trending up 2.1>2.3.   Sedated    Objective:   Weight Range: 190 lb 11.2 oz (86.5 kg) Body mass index is 31.73 kg/(m^2).   Vital Signs:   Temp:  [97.4 F (36.3 C)-99.1 F (37.3 C)] 98.7 F (37.1 C) (04/12 0400) Pulse Rate:  [59-139] 59 (04/12 0700) Resp:  [15-43] 20 (04/12 0700) BP: (73-157)/(51-125) 98/61 mmHg (04/12 0700) SpO2:  [88 %-100 %] 100 % (04/12 0700) FiO2 (%):  [60 %-100 %] 100 % (04/12 0314) Weight:  [190 lb 11.2 oz (86.5 kg)] 190 lb 11.2 oz (86.5 kg) (04/12 0500) Last BM Date: 07/03/15  Weight change: Filed Weights   07/03/15 0500 07/04/15 0400 07/05/15 0500  Weight: 196 lb 3.4 oz (89 kg) 187 lb 6.3 oz (85 kg) 190 lb 11.2 oz (86.5 kg)    Intake/Output:   Intake/Output Summary (Last 24 hours) at 07/05/15 0739 Last data filed at 07/05/15 2505  Gross per 24 hour  Intake 1049.96 ml  Output    670 ml  Net 379.96 ml     Physical Exam: CVP 8 General: Intubated. Sedated.  HEENT: normal Neck: supple. JVP 7-8 cm. Carotids 2+ bilat; no bruits. No thyromegaly or nodule noted. Cor: PMI nondisplaced.  RRR. No rubs or gallops. MR murmur Lungs: Coarse throughout Abdomen: soft, NT, ND, no HSM. No bruits or masses. +BS  Extremities: no cyanosis, clubbing, rash, edema. No nodule or warmth Neuro: Intubated Sedated. Eyes closed.   Telemetry: SR 70s    Labs: CBC  Recent Labs  07/04/15 1410 07/05/15 0530  WBC 11.1* 8.7  HGB 10.0* 8.7*  HCT 32.1* 26.0*  MCV 92.8 90.6  PLT 237 397   Basic Metabolic Panel  Recent Labs  07/04/15 0400 07/04/15 1410 07/05/15 0530  NA 137 134* 133*  K 3.2* 3.9 4.0  CL 101 96* 97*  CO2 _0 GLUCOSE 86 235* 213*  BUN _1 CREATININE 1.85* 2.15* 2.39*  CALCIUM 8.1* 8.2* 8.1*  MG 1.8 2.4  --    Liver Function Tests  Recent Labs  07/04/15 1410 07/05/15 0530  AST 96* 620*  ALT 55* 389*  ALKPHOS 67 66  BILITOT 0.8 1.0  PROT 5.9* 5.0*  ALBUMIN 2.5* 1.9*   No results for input(s): LIPASE, AMYLASE in the last 72 hours. Cardiac Enzymes  Recent Labs  07/04/15 1545 07/04/15 2043 07/05/15 0400  TROPONINI 0.08* 0.49* 1.18*    BNP: BNP (last 3 results)  Recent Labs  02/21/15 1042 03/01/15 0839 04/13/15 2215  BNP 386.1* 452.0* 894.0*    ProBNP (last 3 results) No results for input(s): PROBNP in the last 8760 hours.   D-Dimer  Recent Labs  07/04/15 1410  DDIMER 2.96*   Hemoglobin A1C No results for input(s): HGBA1C in the last 72 hours. Fasting Lipid Panel No results for input(s): CHOL, HDL, LDLCALC, TRIG, CHOLHDL, LDLDIRECT in the last 72 hours. Thyroid Function Tests No results for input(s): TSH, T4TOTAL, T3FREE, THYROIDAB in the last 72 hours.  Invalid input(s): FREET3  Other results:     Imaging/Studies:  Dg Chest Port 1 View  07/04/2015  CLINICAL DATA:  65 year old female intubated.   Initial encounter. EXAM: PORTABLE CHEST 1 VIEW COMPARISON:  06/28/2015 chest radiographs. FINDINGS: Portable AP view at 1527 hours. Endotracheal tube in place, tip just below the clavicles. Enteric tube courses to the  abdomen, tip not included. Stable left chest cardiac AICD. Right PICC line in place, tip at the level of the carina. New confluent bilateral pulmonary airspace opacity, worse on the right. No superimposed pneumothorax or pleural effusion. Stable cardiac size and mediastinal contours. IMPRESSION: 1. Endotracheal tube in good position. Enteric tube courses the abdomen, tip not included. Right PICC line tip at the SVC level. 2. New widespread pulmonary airspace opacity, worse on the right. Top differential considerations include asymmetric pulmonary edema, severe bilateral pneumonia or aspiration. No pleural effusion identified. Electronically Signed   By: Genevie [name] M.D.   On: 07/04/2015 16:36    Latest Echo  Latest Cath   Medications:     Scheduled Medications: . albuterol  2.5 mg Nebulization TID  . allopurinol  100 mg Per Tube Daily  . antiseptic oral rinse  7 mL Mouth Rinse QID  . aspirin  81 mg Per Tube Daily  . atorvastatin  40 mg Oral q1800  . chlorhexidine gluconate (SAGE KIT)  15 mL Mouth Rinse BID  . colchicine  0.3 mg Oral Daily  . famotidine  20 mg Per Tube Daily  . feeding supplement (GLUCERNA SHAKE)  237 mL Oral BID BM  . ferrous sulfate  300 mg Per Tube BID WC  . furosemide  40 mg Intravenous BID  . insulin aspart  0-9 Units Subcutaneous 6 times per day  . [START ON 07/06/2015] levothyroxine  100 mcg Per Tube QAC breakfast  . piperacillin-tazobactam (ZOSYN)  IV  3.375 g Intravenous 3 times per day  . potassium chloride  40 mEq Oral BID  . sodium chloride flush  10-40 mL Intracatheter Q12H  . sodium chloride flush  3 mL Intravenous Q12H  . sodium chloride flush  3 mL Intravenous Q12H    Infusions: . amiodarone 60 mg/hr (07/05/15 0411)  . fentaNYL infusion INTRAVENOUS 75 mcg/hr (07/05/15 0500)  . heparin 1,250 Units/hr (07/05/15 0705)  . lidocaine 1 mg/min (07/04/15 2000)  . midazolam (VERSED) infusion 2 mg/hr (07/05/15 0411)  . milrinone Stopped (07/04/15 1745)    PRN  Medications: sodium chloride, [DISCONTINUED] acetaminophen **OR** acetaminophen, acetaminophen, diphenhydrAMINE, fentaNYL, midazolam, nitroGLYCERIN, ondansetron (ZOFRAN) IV, ondansetron **OR** [DISCONTINUED] ondansetron (ZOFRAN) IV, promethazine, sodium chloride flush, sodium chloride flush   Assessment   1. Respiratory Arrest 2. VF Arrest 3.Acute on chronic combined CHF - EF 20-25% w/ diffuse HK and akinesis of the inferolateral and inferior myocardium, G2DD, severe MR, mild RV dysfunction/dilation, + atrial septal anuerysm 4. Severe MR - by TEE 5.  Severe Pulm HTN - PA pressure 90 mm Hg 6.  PAF 7. AKI on CKD stage IV  8. Chronic anemia - workup in 03/2014 unremarkable GI bleed. 9. DM2 10. HLD 11. Hypokalemia 12. LCB-  No shocks. [place] for meds and drugs.  Plan    Events of yesterday reviewed. Intubated due to respiratory failure.   S/P VF arrest with multiple shocks. On lidocaine and amio. No further VF over night. Mag 4/11 2.4 Todays K 4.0 . Check K and Mag in am.   Today CO-OX is stable 62%. Volume status stable.  Continue Lasix 40 mg IV twice a day. No BB, hydralazine, isordil for now with hypotension.   Hemoglobin 8.7 today. Small amount blood sputum in ETT.   Watch closely.   Length of Stay: [place] NP-C  07/05/2015, 7:39 AM  Advanced Heart Failure Team Pager [phone] (M-F; 7a - 4p)  Please contact [facility] for night-coverage after hours (4p -7a ) and weekends on amion  Patient seen and examined with Darrick Grinder, NP. We discussed all aspects of the encounter. I agree with the assessment and plan as stated above.   She remains intubated on 40% FiO2. No further VT on lido and amio (60). Electrolytes ok. Renal function slightly worse. CXR diffuse infiltrates. Likely pulmonary edema +/- aspiration. Now on zosyn. Continue diuresis.   Long talk with daughter about situation. Wants to be aggressive and try to get her mother back to her pre-hospital state. However  they do not want external defibrillation. Ok to leave internal ICD on. Explained that long-term prognosis probably poor with severe HF. Also discussed process of vent wean. Needs further diuresis first. They are discussing if they would want re-intubation if she were to struggle after eventual extubation.   The patient is critically ill with multiple organ systems failure and requires high complexity decision making for assessment and support, frequent evaluation and titration of therapies, application of advanced monitoring technologies and extensive interpretation of multiple databases.   Critical Care Time devoted to patient care services described in this note is 35 Minutes.    Bensimhon, Daniel,MD 1:49 PM

## 2015-07-05 NOTE — Progress Notes (Signed)
[facility] for coumadin > heparin Indication: afib  Allergies  Allergen Reactions  . Flagyl [Metronidazole] Swelling and Rash    Face swells   . Contrast Media [Iodinated Diagnostic Agents] Rash  . Ioxaglate Rash  . Potassium Sulfate Rash and Other (See Comments)    Headaches  . Potassium-Containing Compounds Other (See Comments)    Headaches-- reports no problems now    Patient Measurements: Height: 5' 5" (165.1 cm) Weight: 190 lb 11.2 oz (86.5 kg) IBW/kg (Calculated) : 57 Heparin Dosing Weight: 74.7kg  Vital Signs: Temp: 98 F (36.7 C) (04/12 1137) Temp Source: Oral (04/12 1137) BP: 113/62 mmHg (04/12 1500) Pulse Rate: 62 (04/12 1500)  Labs:  Recent Labs  07/03/15 0528 07/04/15 0400 07/04/15 1410 07/04/15 1545 07/04/15 2043 07/04/15 2104 07/05/15 0400 07/05/15 0530 07/05/15 1436  HGB 8.6*  --  10.0*  --   --   --   --  8.7*  --   HCT 27.2*  --  32.1*  --   --   --   --  26.0*  --   PLT 181  --  237  --   --   --   --  180  --   LABPROT 17.2* 19.8*  --   --   --   --   --  26.4*  --   INR 1.40 1.68*  --   --   --   --   --  2.47*  --   HEPARINUNFRC  --   --   --   --   --  0.27*  --  <0.10* <0.10*  CREATININE 2.39* 1.85* 2.15*  --   --   --   --  2.39*  --   TROPONINI  --   --   --  0.08* 0.49*  --  1.18*  --   --     Estimated Creatinine Clearance: 26.2 mL/min (by C-G formula based on Cr of 2.39).    Scheduled:  . albuterol  2.5 mg Nebulization TID  . allopurinol  100 mg Per Tube Daily  . antiseptic oral rinse  7 mL Mouth Rinse QID  . aspirin  81 mg Per Tube Daily  . atorvastatin  40 mg Oral q1800  . chlorhexidine gluconate (SAGE KIT)  15 mL Mouth Rinse BID  . colchicine  0.3 mg Oral Daily  . famotidine  20 mg Per Tube Daily  . feeding supplement (GLUCERNA SHAKE)  237 mL Oral BID BM  . feeding supplement (PRO-STAT SUGAR FREE 64)  30 mL Per Tube BID  . feeding supplement (VITAL HIGH PROTEIN)  1,000 mL Per Tube  Q24H  . ferrous sulfate  300 mg Per Tube BID WC  . furosemide  40 mg Intravenous BID  . insulin aspart  0-9 Units Subcutaneous 6 times per day  . [START ON 07/06/2015] levothyroxine  100 mcg Per Tube QAC breakfast  . piperacillin-tazobactam (ZOSYN)  IV  3.375 g Intravenous 3 times per day  . potassium chloride  40 mEq Oral BID  . sodium chloride flush  10-40 mL Intracatheter Q12H  . sodium chloride flush  3 mL Intravenous Q12H  . sodium chloride flush  3 mL Intravenous Q12H    Assessment: 65 yo female here with CP and with history of CAD with PCI.  She is on coumadin PTA for afib (CHADSVASC= 7/9). Pharmacy consulted to dose coumadin. L/RHC when INR < 1.8 received 2 doses Vit K and  cath 4/7  INR 1.3> 1.4>1.7 decent jump overnight will back down dose to prevent over shoot. CBC stable   Pt had code blue called for respiratory dest and intubation then multiple bouts of VT 4/11 stable 4/12  Heparin drip started after intubation while warfarin on hold drip rate 1100 uts/hr with HL 0.27 - rate increased to 1250 uts/hr and drip moved from infusing in central line to peripheral line.  HL now has been < 0.1 x2.  RN checked line - will not pull back arm slightly swollen concern for infiltration.  Heparin drip moved back to infuse through central line and recheck HL in 8hr  Home warfarin 26m daily  Goal of Therapy:  INR 2-3 Monitor platelets by anticoagulation protocol: Yes   Plan:  Continue heparin drip 1250 uts/hr Check HL in 6hr Daily HL/CBC   LBonnita NasutiPharm.D. CPP, BCPS Clinical Pharmacist [phone]/02/2016 4:08 PM

## 2015-07-05 NOTE — Progress Notes (Signed)
OT Cancellation Note  Patient Details Name: [name] Terry MRN: [id number] DOB: [birth year]   Cancelled Treatment:    Reason Eval/Treat Not Completed: Medical issues which prohibited therapy (please reorder as appropriate) Pt now intubated with sedation and code blue on 07/05/15  Vonita Moss   OTR/L Pager: [phone] Office: [phone] .  07/05/2015, 12:08 PM

## 2015-07-06 ENCOUNTER — Inpatient Hospital Stay (HOSPITAL_COMMUNITY): Payer: Medicare Other

## 2015-07-06 LAB — GLUCOSE, CAPILLARY
GLUCOSE-CAPILLARY: 105 mg/dL — AB (ref 65–99)
GLUCOSE-CAPILLARY: 121 mg/dL — AB (ref 65–99)
GLUCOSE-CAPILLARY: 98 mg/dL (ref 65–99)
Glucose-Capillary: 128 mg/dL — ABNORMAL HIGH (ref 65–99)
Glucose-Capillary: 130 mg/dL — ABNORMAL HIGH (ref 65–99)

## 2015-07-06 LAB — BASIC METABOLIC PANEL
ANION GAP: 12 (ref 5–15)
BUN: 26 mg/dL — AB (ref 6–20)
CHLORIDE: 96 mmol/L — AB (ref 101–111)
CO2: 24 mmol/L (ref 22–32)
Calcium: 8.3 mg/dL — ABNORMAL LOW (ref 8.9–10.3)
Creatinine, Ser: 2.61 mg/dL — ABNORMAL HIGH (ref 0.44–1.00)
GFR calc Af Amer: 21 mL/min — ABNORMAL LOW (ref >60)
GFR calc non Af Amer: 18 mL/min — ABNORMAL LOW (ref >60)
GLUCOSE: 242 mg/dL — AB (ref 65–99)
POTASSIUM: 4.8 mmol/L (ref 3.5–5.1)
Sodium: 132 mmol/L — ABNORMAL LOW (ref 135–145)

## 2015-07-06 LAB — POCT I-STAT 3, ART BLOOD GAS (G3+)
BICARBONATE: 25.8 meq/L — AB (ref 20.0–24.0)
O2 Saturation: 93 %
PH ART: 7.369 (ref 7.350–7.450)
TCO2: 27 mmol/L (ref 0–100)
pCO2 arterial: 45 mmHg (ref 35.0–45.0)
pO2, Arterial: 71 mmHg — ABNORMAL LOW (ref 80.0–100.0)

## 2015-07-06 LAB — PHOSPHORUS: Phosphorus: 4 mg/dL (ref 2.5–4.6)

## 2015-07-06 LAB — CBC
HEMATOCRIT: 28.9 % — AB (ref 36.0–46.0)
HEMOGLOBIN: 9.2 g/dL — AB (ref 12.0–15.0)
MCH: 29.2 pg (ref 26.0–34.0)
MCHC: 31.8 g/dL (ref 30.0–36.0)
MCV: 91.7 fL (ref 78.0–100.0)
Platelets: 217 10*3/uL (ref 150–400)
RBC: 3.15 MIL/uL — ABNORMAL LOW (ref 3.87–5.11)
RDW: 18.2 % — AB (ref 11.5–15.5)
WBC: 8.9 10*3/uL (ref 4.0–10.5)

## 2015-07-06 LAB — PROTIME-INR
INR: 4.97 — AB (ref 0.00–1.49)
PROTHROMBIN TIME: 44.8 s — AB (ref 11.6–15.2)

## 2015-07-06 LAB — CARBOXYHEMOGLOBIN
Carboxyhemoglobin: 1.3 % (ref 0.5–1.5)
Methemoglobin: 0.9 % (ref 0.0–1.5)
O2 Saturation: 56.8 %
Total hemoglobin: 9.9 g/dL — ABNORMAL LOW (ref 12.0–16.0)

## 2015-07-06 LAB — HEPARIN LEVEL (UNFRACTIONATED): HEPARIN UNFRACTIONATED: 1.06 [IU]/mL — AB (ref 0.30–0.70)

## 2015-07-06 LAB — MAGNESIUM: Magnesium: 1.9 mg/dL (ref 1.7–2.4)

## 2015-07-06 MED ORDER — ALBUTEROL SULFATE (2.5 MG/3ML) 0.083% IN NEBU: 2.5000 mg | INHALATION_SOLUTION | Freq: Three times a day (TID) | RESPIRATORY_TRACT | Status: DC | PRN

## 2015-07-06 MED ORDER — FUROSEMIDE 10 MG/ML IJ SOLN
80.0000 mg | Freq: Two times a day (BID) | INTRAMUSCULAR | Status: DC
Start: 2015-07-06 — End: 2015-07-14
  Administered 2015-07-06 – 2015-07-13 (×15): 80 mg via INTRAVENOUS
  Filled 2015-07-06 (×15): qty 8

## 2015-07-06 MED ORDER — MAGNESIUM SULFATE 2 GM/50ML IV SOLN: 2.0000 g | Freq: Once | INTRAVENOUS | Status: DC

## 2015-07-06 MED ORDER — FUROSEMIDE 40 MG PO TABS
40.0000 mg | ORAL_TABLET | Freq: Every day | ORAL | Status: DC
  Administered 2015-07-06: 40 mg
  Filled 2015-07-06: qty 1

## 2015-07-06 MED ORDER — MAGNESIUM SULFATE 2 GM/50ML IV SOLN
2.0000 g | Freq: Once | INTRAVENOUS | Status: AC
  Administered 2015-07-06: 2 g via INTRAVENOUS
  Filled 2015-07-06: qty 50

## 2015-07-06 NOTE — Progress Notes (Signed)
ANTICOAGULATION CONSULT NOTE - Follow Up Consult  Pharmacy Consult for heparin Indication: atrial fibrillation   Labs:  Recent Labs  07/03/15 0528 07/04/15 0400 07/04/15 1410 07/04/15 1545 07/04/15 2043  07/05/15 0400 07/05/15 0530 07/05/15 1436 07/06/15  HGB 8.6*  --  10.0*  --   --   --   --  8.7*  --   --   HCT 27.2*  --  32.1*  --   --   --   --  26.0*  --   --   PLT 181  --  237  --   --   --   --  180  --   --   LABPROT 17.2* 19.8*  --   --   --   --   --  26.4*  --   --   INR 1.40 1.68*  --   --   --   --   --  2.47*  --   --   HEPARINUNFRC  --   --   --   --   --   < >  --  <0.10* <0.10* 1.06*  CREATININE 2.39* 1.85* 2.15*  --   --   --   --  2.39*  --   --   TROPONINI  --   --   --  0.08* 0.49*  --  1.18*  --   --   --   < > = values in this interval not displayed.   Assessment: 65yo female supratherapeutic on heparin after rate increase; unfortunately heparin had to be moved to PICC line 2/2 occlusion of peripheral line, another IV line attempted but couldn't be placed, so there are limited options for drawing labs, lab drawn from same line after pausing infusion and flushing but likely affecting lab.  Goal of Therapy:  Heparin level 0.3-0.7 units/ml   Plan:  Will decrease heparin gtt back to 1100 units/hr and check level in [place], PharmD, BCPS  07/06/2015,1:30 AM

## 2015-07-06 NOTE — Progress Notes (Signed)
  Advanced Heart Failure Rounding Note  PCP: Anthony Robertson, PA-C Primary Cardiologist: Nishan EP: Allred  Subjective:   This is a 64-year-old female patient with h/o of CAD, HTN, ICM s/p MDT dual chamber ICD 2013, lymphoma s/p chemo/radiation 2009, chronic systolic CHF, status post VT ablation 2, HTN, HLD, VT, DM, CKD stage III, PMR and depression who presented to [facility] with CP and SOB  Started on empiric milrinone 07/02/15 with presentation concerning for low output.  PICC placed. Coox this am 62.4% on milrinone 0.125.  On 4/11 developed respiratory distress requiring intubation and bronchoscopy. After intubation she was hypotensive and started on dopamine and continued on milrinone. Unfortunately later that evening she had V fib arrest multiple shocks. Milrinone and dopamine stopped. Started on lidocaine and amio drip.   Overnight no events. No VT.   Todays CO-OX 57%. CVP 16-17. Creatinine trending up 2.1>2.3>2.6 .   Sedated    Objective:   Weight Range: 191 lb 5.8 oz (86.8 kg) Body mass index is 31.84 kg/(m^2).   Vital Signs:   Temp:  [98 F (36.7 C)-100 F (37.8 C)] 100 F (37.8 C) (04/13 0412) Pulse Rate:  [62-80] 74 (04/13 0600) Resp:  [0-30] 12 (04/13 0600) BP: (87-130)/(54-79) 102/63 mmHg (04/13 0600) SpO2:  [95 %-100 %] 100 % (04/13 0600) FiO2 (%):  [30 %-100 %] 40 % (04/13 0533) Weight:  [191 lb 5.8 oz (86.8 kg)] 191 lb 5.8 oz (86.8 kg) (04/13 0412) Last BM Date: 07/04/15  Weight change: Filed Weights   07/04/15 0400 07/05/15 0500 07/06/15 0412  Weight: 187 lb 6.3 oz (85 kg) 190 lb 11.2 oz (86.5 kg) 191 lb 5.8 oz (86.8 kg)    Intake/Output:   Intake/Output Summary (Last 24 hours) at 07/06/15 0707 Last data filed at 07/06/15 0600  Gross per 24 hour  Intake 2439.05 ml  Output   1000 ml  Net 1439.05 ml     Physical Exam: CVP 16-17 General: Intubated. Sedated.  HEENT: normal Neck: supple. Carotids 2+ bilat; no bruits. No thyromegaly or nodule  noted. Cor: PMI nondisplaced. RRR. No rubs or gallops. MR murmur Lungs: Coarse throughout Abdomen: soft, NT, ND, no HSM. No bruits or masses. +BS  Extremities: no cyanosis, clubbing, rash, edema. No nodule or warmth Neuro: Intubated Sedated. Eyes closed GU: [place].   Telemetry: SR 70s    Labs: CBC  Recent Labs  07/05/15 0530 07/06/15 0400  WBC 8.7 8.9  HGB 8.7* 9.2*  HCT 26.0* 28.9*  MCV 90.6 91.7  PLT 180 217   Basic Metabolic Panel  Recent Labs  07/04/15 1410 07/05/15 0530 07/06/15 0400  NA 134* 133* 132*  K 3.9 4.0 4.8  CL 96* 97* 96*  CO2 22 23 24  GLUCOSE 235* 213* 242*  BUN 16 20 26*  CREATININE 2.15* 2.39* 2.61*  CALCIUM 8.2* 8.1* 8.3*  MG 2.4  --  1.9  PHOS  --   --  4.0   Liver Function Tests  Recent Labs  07/04/15 1410 07/05/15 0530  AST 96* 620*  ALT 55* 389*  ALKPHOS 67 66  BILITOT 0.8 1.0  PROT 5.9* 5.0*  ALBUMIN 2.5* 1.9*   No results for input(s): LIPASE, AMYLASE in the last 72 hours. Cardiac Enzymes  Recent Labs  07/04/15 1545 07/04/15 2043 07/05/15 0400  TROPONINI 0.08* 0.49* 1.18*    BNP: BNP (last 3 results)  Recent Labs  02/21/15 1042 03/01/15 0839 04/13/15 2215  BNP 386.1* 452.0* 894.0*    ProBNP (  last 3 results) No results for input(s): PROBNP in the last 8760 hours.   D-Dimer  Recent Labs  07/04/15 1410  DDIMER 2.96*   Hemoglobin A1C No results for input(s): HGBA1C in the last 72 hours. Fasting Lipid Panel No results for input(s): CHOL, HDL, LDLCALC, TRIG, CHOLHDL, LDLDIRECT in the last 72 hours. Thyroid Function Tests No results for input(s): TSH, T4TOTAL, T3FREE, THYROIDAB in the last 72 hours.  Invalid input(s): FREET3  Other results:     Imaging/Studies:  Dg Chest Port 1 View  07/04/2015  CLINICAL DATA:  64-year-old female intubated.   Initial encounter. EXAM: PORTABLE CHEST 1 VIEW COMPARISON:  06/28/2015 chest radiographs. FINDINGS: Portable AP view at 1527 hours. Endotracheal tube in  place, tip just below the clavicles. Enteric tube courses to the abdomen, tip not included. Stable left chest cardiac AICD. Right PICC line in place, tip at the level of the carina. New confluent bilateral pulmonary airspace opacity, worse on the right. No superimposed pneumothorax or pleural effusion. Stable cardiac size and mediastinal contours. IMPRESSION: 1. Endotracheal tube in good position. Enteric tube courses the abdomen, tip not included. Right PICC line tip at the SVC level. 2. New widespread pulmonary airspace opacity, worse on the right. Top differential considerations include asymmetric pulmonary edema, severe bilateral pneumonia or aspiration. No pleural effusion identified. Electronically Signed   By: H  Hall M.D.   On: 07/04/2015 16:36    Latest Echo  Latest Cath   Medications:     Scheduled Medications: . albuterol  2.5 mg Nebulization TID  . allopurinol  100 mg Per Tube Daily  . antiseptic oral rinse  7 mL Mouth Rinse QID  . aspirin  81 mg Per Tube Daily  . atorvastatin  40 mg Oral q1800  . chlorhexidine gluconate (SAGE KIT)  15 mL Mouth Rinse BID  . colchicine  0.3 mg Oral Daily  . famotidine  20 mg Per Tube Daily  . feeding supplement (GLUCERNA SHAKE)  237 mL Oral BID BM  . feeding supplement (PRO-STAT SUGAR FREE 64)  30 mL Per Tube BID  . feeding supplement (VITAL HIGH PROTEIN)  1,000 mL Per Tube Q24H  . ferrous sulfate  300 mg Per Tube BID WC  . furosemide  40 mg Intravenous BID  . insulin aspart  0-9 Units Subcutaneous 6 times per day  . levothyroxine  100 mcg Per Tube QAC breakfast  . piperacillin-tazobactam (ZOSYN)  IV  3.375 g Intravenous 3 times per day  . potassium chloride  40 mEq Oral BID  . sodium chloride flush  10-40 mL Intracatheter Q12H  . sodium chloride flush  3 mL Intravenous Q12H  . sodium chloride flush  3 mL Intravenous Q12H    Infusions: . sodium chloride 10 mL/hr at 07/05/15 1900  . amiodarone 30 mg/hr (07/05/15 2105)  . fentaNYL  infusion INTRAVENOUS 100 mcg/hr (07/05/15 1900)  . heparin 1,100 Units/hr (07/06/15 0130)  . lidocaine 1 mg/min (07/06/15 0534)  . midazolam (VERSED) infusion 2 mg/hr (07/06/15 0534)  . milrinone Stopped (07/04/15 1745)    PRN Medications: sodium chloride, [DISCONTINUED] acetaminophen **OR** acetaminophen, acetaminophen, diphenhydrAMINE, fentaNYL, midazolam, nitroGLYCERIN, ondansetron (ZOFRAN) IV, ondansetron **OR** [DISCONTINUED] ondansetron (ZOFRAN) IV, promethazine, sodium chloride flush, sodium chloride flush   Assessment   1. Respiratory Arrest 2. VF Arrest 3.Acute on chronic combined CHF - EF 20-25% w/ diffuse HK and akinesis of the inferolateral and inferior myocardium, G2DD, severe MR, mild RV dysfunction/dilation, + atrial septal anuerysm 4. Severe MR - by   TEE 5.  Severe Pulm HTN - PA pressure 90 mm Hg 6.  PAF 7. AKI on CKD stage IV  8. Chronic anemia - workup in 03/2014 unremarkable GI bleed. 9. DM2 10. HLD 11. Hypokalemia 12. LCB-  No external shocks. [place] for meds and drugs.    Plan    4/11  Intubated due to respiratory failure. On FIO2 40%.   S/P VF arrest with multiple shocks. On lidocaine and amio. No further VF over night. Mag 4/11 2.4 Todays K 4.8 . Mag 1.9. Give 2 grams mag now. .   Today CO-OX marginal 57%. Volume status up. Continue diuresis.  No BB, hydralazine, isordil for now with hypotension.   Hemoglobin 8.7>9.2  today. Small amount blood sputum in ETT.   LCB- Ok to leave internal ICD on. No further external shocks. Family not sure about re-intubation.   Length of Stay: [place] NP-C  07/06/2015, 7:07 AM  Advanced Heart Failure Team Pager [phone] (M-F; 7a - 4p)  Please contact [facility] for night-coverage after hours (4p -7a ) and weekends on amion  Patient seen and examined with Darrick Grinder, NP. We discussed all aspects of the encounter. I agree with the assessment and plan as stated above.   Remains intubated and sedated. CVP up. CXR  with diffuse infiltrates (edema vs ARDS)  Co-ox marginal. Would like to avoid inotropes due to VT. Continue diuresis. Hopefully can try and wean vent soon. Remains very tenuous.   The patient is critically ill with multiple organ systems failure and requires high complexity decision making for assessment and support, frequent evaluation and titration of therapies, application of advanced monitoring technologies and extensive interpretation of multiple databases.   Critical Care Time devoted to patient care services described in this note is 35 Minutes.  Lars Jeziorski,MD 10:14 AM

## 2015-07-06 NOTE — Progress Notes (Signed)
PULMONARY / CRITICAL CARE MEDICINE   Name: [name] MBAYE MRN: [id number] DOB: [birth year]    ADMISSION DATE:  07/13/2015 CONSULTATION DATE:  07/04/15  REFERRING MD:  Fanny Bien  CHIEF COMPLAINT:  Respiratory arrest  HISTORY OF PRESENT ILLNESS:    [name] Terry is 65 yo female with past medical history significant for ischemic cardiomyopathy, stroke, coronary artery disease, lymphoma, CHF  S/p ICD placement ,HTN,HLD, Venticular tachycardia,chronic kidney disease -stage 3, depression, GERD.  Patient presented to ED with chest pain and LHC was not indicative of PCI at the time and was medically managed. On 4/11 approximately during afternoon patient started complaining of chest pain, 8/10. BP was 154/94.  Patient was given 2 doses of nitroglycerin and xanax and patient experienced some releif.  Almost an hour later patient started complaining of another episode of chest pain, she was offered with morphine but she refused. Patient took 1 tablet of percocet.  Patient started having shortness of breath and ended up in  respiratory arrest.  Patient never lost her pulse and ended up getting intubated , now on vent.  SUBJECTIVE:  No events overnight.  Lidocaine off.  VITAL SIGNS: BP 96/63 mmHg  Pulse 67  Temp(Src) 98.9 F (37.2 C) (Oral)  Resp 12  Ht 5\' 5"  (1.651 m)  Wt 86.8 kg (191 lb 5.8 oz)  BMI 31.84 kg/m2  SpO2 100%  HEMODYNAMICS: CVP:  [5 mmHg-16 mmHg] 16 mmHg  VENTILATOR SETTINGS: Vent Mode:  [-] PRVC FiO2 (%):  [30 %-40 %] 40 % Set Rate:  [12 bmp] 12 bmp Vt Set:  [500 mL] 500 mL PEEP:  [5 cmH20] 5 cmH20 Plateau Pressure:  [21 [id number] cmH20] 24 cmH20  INTAKE / OUTPUT: I/O last 3 completed shifts: In: 3563.7 [I.V.:2413.7; NG/GT:900; IV Piggyback:250] Out: [id number] [Urine:1425; Emesis/NG output:50]  PHYSICAL EXAMINATION: General: Sickly appearing black female, intubated and unresponsive. Neuro: obtunded, withdraws all ext to commands. HEENT:  Atraumatic, normocephalic,   Cardiovascular: S1S2, tachycardic, no MRG noted Lungs:crackles bilaterally, no wheezes, rhonchi Abdomen:  Obese, round, BS positive Musculoskeletal:  No inflammation, deformity Skin:  Grossly intact  LABS:  BMET  Recent Labs Lab 07/04/15 1410 07/05/15 0530 07/06/15 0400  NA 134* 133* 132*  K 3.9 4.0 4.8  CL 96* 97* 96*  CO2 22 23 24   BUN 16 20 26*  CREATININE 2.15* 2.39* 2.61*  GLUCOSE 235* 213* 242*    Electrolytes  Recent Labs Lab 07/04/15 0400 07/04/15 1410 07/05/15 0530 07/06/15 0400  CALCIUM 8.1* 8.2* 8.1* 8.3*  MG 1.8 2.4  --  1.9  PHOS  --   --   --  4.0    CBC  Recent Labs Lab 07/04/15 1410 07/05/15 0530 07/06/15 0400  WBC 11.1* 8.7 8.9  HGB 10.0* 8.7* 9.2*  HCT 32.1* 26.0* 28.9*  PLT 237 180 217    Coag's  Recent Labs Lab 07/04/15 0400 07/05/15 0530 07/06/15 0400  INR 1.68* 2.47* 4.97*    Sepsis Markers No results for input(s): LATICACIDVEN, PROCALCITON, O2SATVEN in the last 168 hours.  ABG  Recent Labs Lab 07/04/15 1456 07/05/15 1027 07/06/15 0527  PHART 7.408 7.535* 7.369  PCO2ART 43.9 27.8* 45.0  PO2ART 274.0* 93.0 71.0*    Liver Enzymes  Recent Labs Lab 07/04/15 1410 07/05/15 0530  AST 96* 620*  ALT 55* 389*  ALKPHOS 67 66  BILITOT 0.8 1.0  ALBUMIN 2.5* 1.9*    Cardiac Enzymes  Recent Labs Lab 07/04/15 1545 07/04/15 2043 07/05/15 0400  TROPONINI 0.08* 0.49*  1.18*    Glucose  Recent Labs Lab 07/05/15 1128 07/05/15 1628 07/05/15 2040 07/06/15 0007 07/06/15 0418 07/06/15 0733  GLUCAP 106* 97 89 98 105* 128*    Imaging Dg Chest Port 1 View  07/06/2015  CLINICAL DATA:  Intubation. EXAM: PORTABLE CHEST 1 VIEW COMPARISON:  07/04/2015. FINDINGS: Endotracheal tube and NG tube in stable position. Right PICC line in stable position. Cardiac pacer in stable position. Cardiomegaly with diffuse bilateral pulmonary alveolar infiltrates. These have improved slightly from prior exam. Findings consist with  slight improvement of congestive heart failure. Basilar atelectasis. Small left pleural effusion cannot be excluded. No pneumothorax . IMPRESSION: 1. Lines and tubes in stable position. 2. Cardiac pacer stable position. Cardiomegaly with diffuse bilateral pulmonary infiltrates consistent with pulmonary edema, slight improvement from prior exam. Basilar atelectasis. Small left pleural effusion cannot be excluded. Electronically Signed   By: Marcello Moores  Register   On: 07/06/2015 07:19     STUDIES:  4/6 TEE 4/7 RHC And Coronary angiography Distal LAD , 30% stenosed, proximal Circumflex lesion 405 stenosed, proximal circumflex to mid circumflex 50% stenosed,1st Mrg lesion, 75% stenosed,1st RPLB lesion, 99% stenosed.  CULTURES: 4/11 [facility] 4/11 sputum 4/11 urine  ANTIBIOTICS:  4/11 zosyn>>  SIGNIFICANT EVENTS:  LINES/TUBES: PICC 4/8>>> 4/11 ET>>>  DISCUSSION: 65 YO female with past medical Hx of  ischemic cardiomyopathy, stroke, coronary artery disease, lymphoma, CHF  S/p ICD placement ,HTN,HLD, Venticular tachycardia,chronic kidney disease -stage 3, depression, GERD, NSTEMI, had repiratory arrested,intubated and currently on vent  ASSESSMENT / PLAN:  PULMONARY A: Acute Hypoxemic respiratory failure Pulmonary edema ?Aspiration  P:   PRVC mode ventilation, wean as tolerated PAN cultures and f/u. CXR in am Diurese as ordered. Fentanly/versed for sedation Zosyn for asp. PNA Duoneb/albuterol CBC, Chem in am  CARDIOVASCULAR A:  NSTEMI Chronic systolic CHF Hx of CAD, S/P stenting PAF P:  Off milrinone. Heparin GTT stopped by cards. Hold oral coumadin. Trend troponin.  RENAL A:   chronic kidney disease -stage 3 Increased Anion gap acidosis Hyponatremia   P:   BMET in AM. Electrolyte replacement as needed. KVO IVF. Lasix 80 mg IV BID.  GASTROINTESTINAL A:   C-DIFF- Antigen+, toxin -negative -repeat pending GERD P:  TF per nutrition. C diff Ag positive, toxin  negative, awaiting stool. Protonix for GIP.  HEMATOLOGIC A:   No active issues P:  SCD's Heparin DVT prophylaxis Transfuse if HgB<7  INFECTIOUS A:   Aspiration PNA P:   PAN culture Antibiotics as above   ENDOCRINE A:   Diabetese Hypothyroidism P:   SSI coverage. Levothyroxine I/V formulary.  NEUROLOGIC A:   Hx OF Depression P:   RASS goa 0 -1. Hold paxil/seroquel.  FAMILY  - Updates: No family bedside.  - Inter-disciplinary family meet or Palliative Care meeting due by: 07/10/15.  The patient is critically ill with multiple organ systems failure and requires high complexity decision making for assessment and support, frequent evaluation and titration of therapies, application of advanced monitoring technologies and extensive interpretation of multiple databases.   Critical Care Time devoted to patient care services described in this note is 35 Minutes. This time reflects time of care of this signee Dr Jennet Maduro. This critical care time does not reflect procedure time, or teaching time or supervisory time of PA/NP/Med student/Med Resident etc but could involve care discussion time.  [name] Terry, M.D. [facility] Pulmonary/Critical Care Medicine. Pager: [phone]. After hours pager: [phone].

## 2015-07-07 ENCOUNTER — Inpatient Hospital Stay (HOSPITAL_COMMUNITY): Payer: Medicare Other

## 2015-07-07 DIAGNOSIS — J9620 Acute and chronic respiratory failure, unspecified whether with hypoxia or hypercapnia: Secondary | ICD-10-CM

## 2015-07-07 LAB — BASIC METABOLIC PANEL
Anion gap: 10 (ref 5–15)
BUN: 35 mg/dL — AB (ref 6–20)
CHLORIDE: 100 mmol/L — AB (ref 101–111)
CO2: 26 mmol/L (ref 22–32)
CREATININE: 2.89 mg/dL — AB (ref 0.44–1.00)
Calcium: 8.3 mg/dL — ABNORMAL LOW (ref 8.9–10.3)
GFR calc Af Amer: 19 mL/min — ABNORMAL LOW (ref >60)
GFR calc non Af Amer: 16 mL/min — ABNORMAL LOW (ref >60)
GLUCOSE: 145 mg/dL — AB (ref 65–99)
Potassium: 4.4 mmol/L (ref 3.5–5.1)
Sodium: 136 mmol/L (ref 135–145)

## 2015-07-07 LAB — GLUCOSE, CAPILLARY
GLUCOSE-CAPILLARY: 127 mg/dL — AB (ref 65–99)
GLUCOSE-CAPILLARY: 142 mg/dL — AB (ref 65–99)
GLUCOSE-CAPILLARY: 130 mg/dL — AB (ref 65–99)
GLUCOSE-CAPILLARY: 131 mg/dL — AB (ref 65–99)
Glucose-Capillary: 132 mg/dL — ABNORMAL HIGH (ref 65–99)
Glucose-Capillary: 140 mg/dL — ABNORMAL HIGH (ref 65–99)
Glucose-Capillary: 109 mg/dL — ABNORMAL HIGH (ref 65–99)

## 2015-07-07 LAB — BLOOD GAS, ARTERIAL
ACID-BASE EXCESS: 1.4 mmol/L (ref 0.0–2.0)
Bicarbonate: 25.9 mEq/L — ABNORMAL HIGH (ref 20.0–24.0)
DRAWN BY: 419771
FIO2: 0.4
O2 SAT: 98.8 %
PATIENT TEMPERATURE: 98.6
PCO2 ART: 44 mmHg (ref 35.0–45.0)
PEEP/CPAP: 5 cmH2O
PH ART: 7.387 (ref 7.350–7.450)
RATE: 12 resp/min
TCO2: 27.2 mmol/L (ref 0–100)
VT: 500 mL
pO2, Arterial: 129 mmHg — ABNORMAL HIGH (ref 80.0–100.0)

## 2015-07-07 LAB — CBC WITH DIFFERENTIAL/PLATELET
Basophils Absolute: 0 10*3/uL (ref 0.0–0.1)
Basophils Relative: 0 %
EOS ABS: 0.2 10*3/uL (ref 0.0–0.7)
Eosinophils Relative: 3 %
HCT: 25.3 % — ABNORMAL LOW (ref 36.0–46.0)
HEMOGLOBIN: 8.2 g/dL — AB (ref 12.0–15.0)
LYMPHS ABS: 0.7 10*3/uL (ref 0.7–4.0)
LYMPHS PCT: 12 %
MCH: 29.8 pg (ref 26.0–34.0)
MCHC: 32.4 g/dL (ref 30.0–36.0)
MCV: 92 fL (ref 78.0–100.0)
MONOS PCT: 7 %
Monocytes Absolute: 0.4 10*3/uL (ref 0.1–1.0)
NEUTROS PCT: 78 %
Neutro Abs: 5 10*3/uL (ref 1.7–7.7)
Platelets: 177 10*3/uL (ref 150–400)
RBC: 2.75 MIL/uL — ABNORMAL LOW (ref 3.87–5.11)
RDW: 18.2 % — ABNORMAL HIGH (ref 11.5–15.5)
WBC: 6.3 10*3/uL (ref 4.0–10.5)

## 2015-07-07 LAB — CBC
HCT: 22.2 % — ABNORMAL LOW (ref 36.0–46.0)
Hemoglobin: 7 g/dL — ABNORMAL LOW (ref 12.0–15.0)
MCH: 28.9 pg (ref 26.0–34.0)
MCHC: 31.5 g/dL (ref 30.0–36.0)
MCV: 91.7 fL (ref 78.0–100.0)
Platelets: 213 10*3/uL (ref 150–400)
RBC: 2.42 MIL/uL — ABNORMAL LOW (ref 3.87–5.11)
RDW: 18 % — AB (ref 11.5–15.5)
WBC: 7.4 10*3/uL (ref 4.0–10.5)

## 2015-07-07 LAB — CARBOXYHEMOGLOBIN
Carboxyhemoglobin: 1.4 % (ref 0.5–1.5)
Methemoglobin: 0.8 % (ref 0.0–1.5)
O2 SAT: 64.5 %
TOTAL HEMOGLOBIN: 9.4 g/dL — AB (ref 12.0–16.0)

## 2015-07-07 LAB — PROTIME-INR
INR: 5.7 (ref 0.00–1.49)
Prothrombin Time: 49.6 seconds — ABNORMAL HIGH (ref 11.6–15.2)

## 2015-07-07 LAB — CULTURE, BAL-QUANTITATIVE: SPECIAL REQUESTS: NORMAL

## 2015-07-07 LAB — MAGNESIUM: Magnesium: 2.2 mg/dL (ref 1.7–2.4)

## 2015-07-07 LAB — CULTURE, BAL-QUANTITATIVE W GRAM STAIN: Culture: NORMAL

## 2015-07-07 LAB — PHOSPHORUS: Phosphorus: 4.3 mg/dL (ref 2.5–4.6)

## 2015-07-07 MED ORDER — PAROXETINE HCL 10 MG PO TABS
10.0000 mg | ORAL_TABLET | Freq: Every day | ORAL | Status: DC
  Administered 2015-07-07 – 2015-07-15 (×8): 10 mg via ORAL
  Filled 2015-07-07 (×10): qty 1

## 2015-07-07 MED ORDER — QUETIAPINE FUMARATE 50 MG PO TABS
25.0000 mg | ORAL_TABLET | Freq: Every day | ORAL | Status: DC
  Administered 2015-07-08 – 2015-07-10 (×3): 25 mg via ORAL
  Filled 2015-07-07 (×3): qty 1

## 2015-07-07 MED ORDER — DIPHENHYDRAMINE HCL 25 MG PO CAPS: 25.0000 mg | ORAL_CAPSULE | Freq: Every evening | ORAL | Status: DC | PRN

## 2015-07-07 MED ORDER — DIPHENHYDRAMINE HCL 12.5 MG/5ML PO ELIX
25.0000 mg | ORAL_SOLUTION | Freq: Every evening | ORAL | Status: DC | PRN
  Administered 2015-07-07: 25 mg
  Filled 2015-07-07 (×2): qty 10

## 2015-07-07 NOTE — Progress Notes (Signed)
Pharmacy Antibiotic Note  [name] Terry is a 65 y.o. female who continues on day #4 zosyn for aspiration pneumonia. Renal function worsening, CrCl 21 ml/min which is borderline for dose adjustment. Also noted to have UTI with urine culture growing ecoli.   Plan: 1) Continue zosyn 3.375g IV q8 (4 hour infusion) 2) Continue to follow renal function, cultures  Height: 5\' 5"  (165.1 cm) Weight: 189 lb 9.5 oz (86 kg) IBW/kg (Calculated) : 57  Temp (24hrs), Avg:97.8 F (36.6 C), Min:96 F (35.6 C), Max:99.3 F (37.4 C)   Recent Labs Lab 07/04/15 0400 07/04/15 1410 07/05/15 0530 07/06/15 0400 07/07/15 0255 07/07/15 0745  WBC  --  11.1* 8.7 8.9 7.4 6.3  CREATININE 1.85* 2.15* 2.39* 2.61* 2.89*  --     Estimated Creatinine Clearance: 21.6 mL/min (by C-G formula based on Cr of 2.89).    Allergies  Allergen Reactions  . Flagyl [Metronidazole] Swelling and Rash    Face swells   . Contrast Media [Iodinated Diagnostic Agents] Rash  . Ioxaglate Rash  . Potassium Sulfate Rash and Other (See Comments)    Headaches  . Potassium-Containing Compounds Other (See Comments)    Headaches-- reports no problems now    Antimicrobials this admission: 4/11 Zosyn >>  Dose adjustments this admission: n/a  Microbiology results: 4/11 BAL >>normal flora 4/11 blood x2 >> ngtd 4/11 urine >> 100k Ecoli 4/5 Cdiff antigen positive   Thank you for allowing pharmacy to be a part of this patient's care.  [name] 07/07/2015 12:49 PM

## 2015-07-07 NOTE — Progress Notes (Signed)
Advanced Heart Failure Rounding Note  PCP: Karna Dupes, PA-C Primary Cardiologist: Johnsie Cancel EP: Allred  Subjective:   This is a 65 year old female patient with h/o of CAD, HTN, ICM s/p MDT dual chamber ICD 2013, lymphoma s/p chemo/radiation 2637, chronic systolic CHF, status post VT ablation 2, HTN, HLD, VT, DM, CKD stage III, PMR and depression who presented to [facility] with CP and SOB  Started on empiric milrinone 07/02/15 with presentation concerning for low output.  PICC placed. On 4/11 developed respiratory distress requiring intubation and bronchoscopy. After intubation she was hypotensive and started on dopamine and continued on milrinone. Unfortunately later that evening she had V fib arrest multiple shocks. Milrinone and dopamine stopped. Started on lidocaine and amio drip.   Overnight no events. No VT.  Diuresing well. Trying to wean from vent. Tongue swollen.   Todays CO-OX 65%. CVP 15. Creatinine trending up 2.1>2.3>2.6 >2.9.   Sedated    Objective:   Weight Range: 86 kg (189 lb 9.5 oz) Body mass index is 31.55 kg/(m^2).   Vital Signs:   Temp:  [96 F (35.6 C)-99.3 F (37.4 C)] 97 F (36.1 C) (04/14 1213) Pulse Rate:  [59-82] 69 (04/14 1213) Resp:  [12-15] 15 (04/14 1213) BP: (85-137)/(53-83) 100/63 mmHg (04/14 1100) SpO2:  [98 %-100 %] 100 % (04/14 1213) FiO2 (%):  [40 %] 40 % (04/14 1213) Weight:  [86 kg (189 lb 9.5 oz)] 86 kg (189 lb 9.5 oz) (04/14 0500) Last BM Date: 07/04/15  Weight change: Filed Weights   07/05/15 0500 07/06/15 0412 07/07/15 0500  Weight: 86.5 kg (190 lb 11.2 oz) 86.8 kg (191 lb 5.8 oz) 86 kg (189 lb 9.5 oz)    Intake/Output:   Intake/Output Summary (Last 24 hours) at 07/07/15 1257 Last data filed at 07/07/15 1200  Gross per 24 hour  Intake 2200.58 ml  Output   1750 ml  Net 450.58 ml     Physical Exam: CVP 15 General: Intubated. Sedated.  HEENT: ETT with swollen tongue Neck: supple. Carotids 2+ bilat; no bruits. No  thyromegaly or nodule noted. Cor: PMI nondisplaced. RRR. No rubs or gallops. MR murmur Lungs: Coarse throughout Abdomen: soft, NT, ND, no HSM. No bruits or masses. +BS  Extremities: no cyanosis, clubbing, rash, edema. No nodule or warmth Neuro: Intubated Sedated. Eyes closed GU: Foley.   Telemetry: SR 70s    Labs: CBC  Recent Labs  07/07/15 0255 07/07/15 0745  WBC 7.4 6.3  NEUTROABS  --  5.0  HGB 7.0* 8.2*  HCT 22.2* 25.3*  MCV 91.7 92.0  PLT 213 858   Basic Metabolic Panel  Recent Labs  07/06/15 0400 07/07/15 0255  NA 132* 136  K 4.8 4.4  CL 96* 100*  CO2 24 26  GLUCOSE 242* 145*  BUN 26* 35*  CREATININE 2.61* 2.89*  CALCIUM 8.3* 8.3*  MG 1.9 2.2  PHOS 4.0 4.3   Liver Function Tests  Recent Labs  07/04/15 1410 07/05/15 0530  AST 96* 620*  ALT 55* 389*  ALKPHOS 67 66  BILITOT 0.8 1.0  PROT 5.9* 5.0*  ALBUMIN 2.5* 1.9*   No results for input(s): LIPASE, AMYLASE in the last 72 hours. Cardiac Enzymes  Recent Labs  07/04/15 1545 07/04/15 2043 07/05/15 0400  TROPONINI 0.08* 0.49* 1.18*    BNP: BNP (last 3 results)  Recent Labs  02/21/15 1042 03/01/15 0839 04/13/15 2215  BNP 386.1* 452.0* 894.0*    ProBNP (last 3 results) No results for input(s): PROBNP  in the last 8760 hours.   D-Dimer  Recent Labs  07/04/15 1410  DDIMER 2.96*   Hemoglobin A1C No results for input(s): HGBA1C in the last 72 hours. Fasting Lipid Panel No results for input(s): CHOL, HDL, LDLCALC, TRIG, CHOLHDL, LDLDIRECT in the last 72 hours. Thyroid Function Tests No results for input(s): TSH, T4TOTAL, T3FREE, THYROIDAB in the last 72 hours.  Invalid input(s): FREET3  Other results:     Imaging/Studies:  Dg Chest Port 1 View  07/07/2015  CLINICAL DATA:  Cardiomyopathy, cardiac dysrhythmia, acute and chronic CHF EXAM: PORTABLE CHEST 1 VIEW COMPARISON:  Portable chest x-ray of July 06, 2015 FINDINGS: The lungs remain mildly hypoinflated. The interstitial  markings remain increased in the mid and lower lungs bilaterally. The pulmonary vascularity remains engorged. The cardiac silhouette remains enlarged. The endotracheal tube tip lies 3.5 cm above the carina. The esophagogastric tube tip projects below the inferior margin of the image. The pacemaker defibrillator is in stable position. IMPRESSION: There has not been significant interval change in the appearance of the chest since yesterday's study. Persistent CHF with probable bibasilar atelectasis and small left pleural effusion. Electronically Signed   By: David  Martinique M.D.   On: 07/07/2015 07:26   Dg Chest Port 1 View  07/06/2015  CLINICAL DATA:  Intubation. EXAM: PORTABLE CHEST 1 VIEW COMPARISON:  07/04/2015. FINDINGS: Endotracheal tube and NG tube in stable position. Right PICC line in stable position. Cardiac pacer in stable position. Cardiomegaly with diffuse bilateral pulmonary alveolar infiltrates. These have improved slightly from prior exam. Findings consist with slight improvement of congestive heart failure. Basilar atelectasis. Small left pleural effusion cannot be excluded. No pneumothorax . IMPRESSION: 1. Lines and tubes in stable position. 2. Cardiac pacer stable position. Cardiomegaly with diffuse bilateral pulmonary infiltrates consistent with pulmonary edema, slight improvement from prior exam. Basilar atelectasis. Small left pleural effusion cannot be excluded. Electronically Signed   By: Marcello Moores  Register   On: 07/06/2015 07:19    Latest Echo  Latest Cath   Medications:     Scheduled Medications: . allopurinol  100 mg Per Tube Daily  . antiseptic oral rinse  7 mL Mouth Rinse QID  . aspirin  81 mg Per Tube Daily  . chlorhexidine gluconate (SAGE KIT)  15 mL Mouth Rinse BID  . colchicine  0.3 mg Oral Daily  . famotidine  20 mg Per Tube Daily  . feeding supplement (PRO-STAT SUGAR FREE 64)  30 mL Per Tube BID  . feeding supplement (VITAL HIGH PROTEIN)  1,000 mL Per Tube Q24H  .  ferrous sulfate  300 mg Per Tube BID WC  . furosemide  80 mg Intravenous BID  . insulin aspart  0-9 Units Subcutaneous 6 times per day  . levothyroxine  100 mcg Per Tube QAC breakfast  . PARoxetine  10 mg Oral Daily  . piperacillin-tazobactam (ZOSYN)  IV  3.375 g Intravenous 3 times per day  . potassium chloride  40 mEq Oral BID  . QUEtiapine  25 mg Oral QHS  . sodium chloride flush  10-40 mL Intracatheter Q12H  . sodium chloride flush  3 mL Intravenous Q12H  . sodium chloride flush  3 mL Intravenous Q12H    Infusions: . sodium chloride Stopped (07/07/15 0500)  . amiodarone 30 mg/hr (07/07/15 1248)  . fentaNYL infusion INTRAVENOUS 100 mcg/hr (07/07/15 1000)  . milrinone Stopped (07/04/15 1745)    PRN Medications: sodium chloride, [DISCONTINUED] acetaminophen **OR** acetaminophen, acetaminophen, albuterol, diphenhydrAMINE, fentaNYL, nitroGLYCERIN, ondansetron (ZOFRAN) IV,  ondansetron **OR** [DISCONTINUED] ondansetron (ZOFRAN) IV, promethazine, sodium chloride flush, sodium chloride flush   Assessment   1. Respiratory Arrest 2. VF Arrest 3.Acute on chronic combined CHF - EF 20-25% w/ diffuse HK and akinesis of the inferolateral and inferior myocardium, G2DD, severe MR, mild RV dysfunction/dilation, + atrial septal anuerysm 4. Severe MR - by TEE 5.  Severe Pulm HTN - PA pressure 90 mm Hg 6.  PAF 7. AKI on CKD stage IV  8. Chronic anemia - workup in 03/2014 unremarkable GI bleed. 9. DM2 10. HLD 11. Hypokalemia 12. LCB-  No external shocks. Universal for meds and drugs.    Plan    Remains intubated and sedated. CVP improving slowly but still up. CXR with diffuse infiltrates (edema vs ARDS) possible mild improvement.  Co-ox ok. No further VT.  Continue diuresis.   Renal function continues to get worse. Improved with inotropes earlier this admit but would like to avoid inotropes due to VT.  CCM weaning vent. Tongue swelling appears to be bite and not angioedema.   Prognosis  tenuous.  The patient is critically ill with multiple organ systems failure and requires high complexity decision making for assessment and support, frequent evaluation and titration of therapies, application of advanced monitoring technologies and extensive interpretation of multiple databases.   Critical Care Time devoted to patient care services described in this note is 35 Minutes.  Length of Stay: 10  Glori Bickers MD  07/07/2015, 12:57 PM  Advanced Heart Failure Team Pager [phone] (M-F; [place])  Please contact [facility] for night-coverage after hours (4p -7a ) and weekends on amion

## 2015-07-07 NOTE — Progress Notes (Signed)
40 cc of 50 cc bag versed wasted in sink by two RNs Lexi and Lattie Haw.

## 2015-07-07 NOTE — Progress Notes (Signed)
Pt hbg 7 on morning labs. VS stable. MD paged and ordered a recheck at 08:00  Will continue to monitor pt  Paulene Floor, RN

## 2015-07-07 NOTE — Progress Notes (Signed)
PULMONARY / CRITICAL CARE MEDICINE   Name: [name] Terry MRN: [id number] DOB: 1950/12/14    ADMISSION DATE:  07/22/2015 CONSULTATION DATE:  07/04/15  REFERRING MD:  Fanny Bien  CHIEF COMPLAINT:  Respiratory arrest  HISTORY OF PRESENT ILLNESS:    [name] Terry is 65 yo female with past medical history significant for ischemic cardiomyopathy, stroke, coronary artery disease, lymphoma, CHF  S/p ICD placement ,HTN,HLD, Venticular tachycardia,chronic kidney disease -stage 3, depression, GERD.  Patient presented to ED with chest pain and LHC was not indicative of PCI at the time and was medically managed.  Started on empiric milrinone 07/02/15 with presentation concerning for low output On 4/11 developed  respiratory arrest.  Patient never lost her pulse and was intubated , now on vent. Developed VF arrest 4/11 evening requiring multiple shocks  SUBJECTIVE:  On amio gtt - no arrythmias Afebrile UO picking up on lasix Sedated on fent gtt  VITAL SIGNS: BP 101/64 mmHg  Pulse 62  Temp(Src) 96 F (35.6 C) (Axillary)  Resp 12  Ht 5\' 5"  (1.651 m)  Wt 189 lb 9.5 oz (86 kg)  BMI 31.55 kg/m2  SpO2 100%  HEMODYNAMICS: CVP:  [8 mmHg-16 mmHg] 11 mmHg  VENTILATOR SETTINGS: Vent Mode:  [-] PRVC FiO2 (%):  [40 %] 40 % Set Rate:  [12 bmp] 12 bmp Vt Set:  [500 mL] 500 mL PEEP:  [5 cmH20] 5 cmH20 Plateau Pressure:  [20 cmH20-26 cmH20] 24 cmH20  INTAKE / OUTPUT: I/O last 3 completed shifts: In: 3755.4 [I.V.:1715.4; NG/GT:1740; IV Piggyback:300] Out: 2520 [Urine:2520]  PHYSICAL EXAMINATION: General: Sickly appearing black female, intubated and unresponsive. Neuro: sedated, anxious when awake per RN, RASS-2 HEENT:  Atraumatic, normocephalic,large tongue  Cardiovascular: S1S2, tachycardic, no MRG noted Lungs:crackles bilaterally, no wheezes, rhonchi Abdomen:  Obese, round, BS positive Musculoskeletal:  No inflammation, deformity Skin:  Grossly intact  LABS:  BMET  Recent Labs Lab  07/05/15 0530 07/06/15 0400 07/07/15 0255  NA 133* 132* 136  K 4.0 4.8 4.4  CL 97* 96* 100*  CO2 23 24 26   BUN 20 26* 35*  CREATININE 2.39* 2.61* 2.89*  GLUCOSE 213* 242* 145*    Electrolytes  Recent Labs Lab 07/04/15 1410 07/05/15 0530 07/06/15 0400 07/07/15 0255  CALCIUM 8.2* 8.1* 8.3* 8.3*  MG 2.4  --  1.9 2.2  PHOS  --   --  4.0 4.3    CBC  Recent Labs Lab 07/06/15 0400 07/07/15 0255 07/07/15 0745  WBC 8.9 7.4 6.3  HGB 9.2* 7.0* 8.2*  HCT 28.9* 22.2* 25.3*  PLT 217 213 177    Coag's  Recent Labs Lab 07/05/15 0530 07/06/15 0400 07/07/15 0255  INR 2.47* 4.97* 5.70*    Sepsis Markers No results for input(s): LATICACIDVEN, PROCALCITON, O2SATVEN in the last 168 hours.  ABG  Recent Labs Lab 07/05/15 1027 07/06/15 0527 07/07/15 0423  PHART 7.535* 7.369 7.387  PCO2ART 27.8* 45.0 44.0  PO2ART 93.0 71.0* 129*    Liver Enzymes  Recent Labs Lab 07/04/15 1410 07/05/15 0530  AST 96* 620*  ALT 55* 389*  ALKPHOS 67 66  BILITOT 0.8 1.0  ALBUMIN 2.5* 1.9*    Cardiac Enzymes  Recent Labs Lab 07/04/15 1545 07/04/15 2043 07/05/15 0400  TROPONINI 0.08* 0.49* 1.18*    Glucose  Recent Labs Lab 07/06/15 0733 07/06/15 1128 07/06/15 1619 07/06/15 1938 07/07/15 0012 07/07/15 0302  GLUCAP 128* 130* 121* 127* 132* 142*    Imaging Dg Chest Port 1 View  07/07/2015  CLINICAL DATA:  Cardiomyopathy, cardiac dysrhythmia, acute and chronic CHF EXAM: PORTABLE CHEST 1 VIEW COMPARISON:  Portable chest x-ray of July 06, 2015 FINDINGS: The lungs remain mildly hypoinflated. The interstitial markings remain increased in the mid and lower lungs bilaterally. The pulmonary vascularity remains engorged. The cardiac silhouette remains enlarged. The endotracheal tube tip lies 3.5 cm above the carina. The esophagogastric tube tip projects below the inferior margin of the image. The pacemaker defibrillator is in stable position. IMPRESSION: There has not been  significant interval change in the appearance of the chest since yesterday's study. Persistent CHF with probable bibasilar atelectasis and small left pleural effusion. Electronically Signed   By: David  Martinique M.D.   On: 07/07/2015 07:26     STUDIES:  4/6 TEE 4/7 RHC And Coronary angiography Distal LAD , 30% stenosed, proximal Circumflex lesion 405 stenosed, proximal circumflex to mid circumflex 50% stenosed,1st Mrg lesion, 75% stenosed,1st RPLB lesion, 99% stenosed.  CULTURES: 4/11 [facility] 4/11 sputum>> nml flora 4/11 urine>> e coli  ANTIBIOTICS:  4/11 zosyn>>  SIGNIFICANT EVENTS:  LINES/TUBES: PICC 4/8>>> 4/11 ET>>>  DISCUSSION: 65 YO female with past medical Hx of  ischemic cardiomyopathy, stroke, coronary artery disease, lymphoma, CHF  S/p ICD placement ,HTN,HLD, Venticular tachycardia,chronic kidney disease -stage 3, depression, GERD, NSTEMI, had repiratory arrested,intubated and currently on vent  ASSESSMENT / PLAN:  PULMONARY A: Acute Hypoxemic respiratory failure Pulmonary edema ?Aspiration  P:   SBTs as tolerated - will not extubate due to large tongue Diurese  Duoneb/albuterol   CARDIOVASCULAR A:  NSTEMI Chronic systolic CHF-EF 123456, severe MR Hx of CAD, S/P stenting PAF P:  Off milrinone. On amio gtt   RENAL A:   chronic kidney disease -stage 3 Increased Anion gap acidosis Hyponatremia   P:   Electrolyte replacement as needed. Lasix 80 mg IV BID.  GASTROINTESTINAL A:   C-DIFF- Antigen+, toxin -negative -repeat pending GERD P:  TF per nutrition. Protonix for GIP.  HEMATOLOGIC A:   Coagulopathy P:  SCD's Heparin GTT stopped by cards. Since INR high Hold oral coumadin. Transfuse if HgB<7  INFECTIOUS A:   Aspiration PNA P:   PAN culture Antibiotics as above   ENDOCRINE A:   Diabetese Hypothyroidism P:   SSI coverage. Levothyroxine -change to tube  NEUROLOGIC A:   Hx OF Depression/ anxiety P:   RASS goal -1. Hold  paxil/seroquel.  FAMILY  - Updates: No family bedside.  - Inter-disciplinary family meet or Palliative Care meeting due by: 07/10/15.   Summary - Acute systolic CHF with VF -arrest , on amio gtt, unclear cause of large tongue ? Allergy vs tongue bite, wean but no extubation for now, diuresing  The patient is critically ill with multiple organ systems failure and requires high complexity decision making for assessment and support, frequent evaluation and titration of therapies, application of advanced monitoring technologies and extensive interpretation of multiple databases. Critical Care Time devoted to patient care services described in this note independent of APP time is 35 minutes.    Kara Mead MD. [facility]. [facility] Pager [phone] If no response call [phone]   07/07/2015

## 2015-07-08 ENCOUNTER — Inpatient Hospital Stay (HOSPITAL_COMMUNITY): Payer: Medicare Other

## 2015-07-08 DIAGNOSIS — J9600 Acute respiratory failure, unspecified whether with hypoxia or hypercapnia: Secondary | ICD-10-CM

## 2015-07-08 LAB — GLUCOSE, CAPILLARY
GLUCOSE-CAPILLARY: 104 mg/dL — AB (ref 65–99)
GLUCOSE-CAPILLARY: 147 mg/dL — AB (ref 65–99)
Glucose-Capillary: 109 mg/dL — ABNORMAL HIGH (ref 65–99)
Glucose-Capillary: 113 mg/dL — ABNORMAL HIGH (ref 65–99)
Glucose-Capillary: 146 mg/dL — ABNORMAL HIGH (ref 65–99)
Glucose-Capillary: 157 mg/dL — ABNORMAL HIGH (ref 65–99)

## 2015-07-08 LAB — COMPREHENSIVE METABOLIC PANEL
ALT: 174 U/L — AB (ref 14–54)
ANION GAP: 11 (ref 5–15)
AST: 92 U/L — ABNORMAL HIGH (ref 15–41)
Albumin: 1.9 g/dL — ABNORMAL LOW (ref 3.5–5.0)
Alkaline Phosphatase: 62 U/L (ref 38–126)
BUN: 40 mg/dL — ABNORMAL HIGH (ref 6–20)
CHLORIDE: 101 mmol/L (ref 101–111)
CO2: 26 mmol/L (ref 22–32)
CREATININE: 2.44 mg/dL — AB (ref 0.44–1.00)
Calcium: 8.4 mg/dL — ABNORMAL LOW (ref 8.9–10.3)
GFR, EST AFRICAN AMERICAN: 23 mL/min — AB (ref >60)
GFR, EST NON AFRICAN AMERICAN: 20 mL/min — AB (ref >60)
Glucose, Bld: 172 mg/dL — ABNORMAL HIGH (ref 65–99)
POTASSIUM: 3.7 mmol/L (ref 3.5–5.1)
Sodium: 138 mmol/L (ref 135–145)
Total Bilirubin: 0.8 mg/dL (ref 0.3–1.2)
Total Protein: 5.6 g/dL — ABNORMAL LOW (ref 6.5–8.1)

## 2015-07-08 LAB — CBC
HCT: 26.8 % — ABNORMAL LOW (ref 36.0–46.0)
HEMOGLOBIN: 8.4 g/dL — AB (ref 12.0–15.0)
MCH: 28.6 pg (ref 26.0–34.0)
MCHC: 31.3 g/dL (ref 30.0–36.0)
MCV: 91.2 fL (ref 78.0–100.0)
PLATELETS: 190 10*3/uL (ref 150–400)
RBC: 2.94 MIL/uL — AB (ref 3.87–5.11)
RDW: 18 % — ABNORMAL HIGH (ref 11.5–15.5)
WBC: 6.7 10*3/uL (ref 4.0–10.5)

## 2015-07-08 LAB — PROTIME-INR
INR: 5.94 — AB (ref 0.00–1.49)
Prothrombin Time: 51.1 seconds — ABNORMAL HIGH (ref 11.6–15.2)

## 2015-07-08 LAB — CARBOXYHEMOGLOBIN
CARBOXYHEMOGLOBIN: 1.6 % — AB (ref 0.5–1.5)
METHEMOGLOBIN: 0.8 % (ref 0.0–1.5)
O2 SAT: 61.8 %
TOTAL HEMOGLOBIN: 9.9 g/dL — AB (ref 12.0–16.0)

## 2015-07-08 MED ORDER — ANTISEPTIC ORAL RINSE SOLUTION (CORINZ)
7.0000 mL | OROMUCOSAL | Status: DC
  Administered 2015-07-08 – 2015-07-17 (×87): 7 mL via OROMUCOSAL

## 2015-07-08 MED ORDER — MIDAZOLAM HCL 2 MG/2ML IJ SOLN
1.0000 mg | INTRAMUSCULAR | Status: DC | PRN
  Administered 2015-07-08 – 2015-07-13 (×5): 2 mg via INTRAVENOUS
  Filled 2015-07-08 (×7): qty 2

## 2015-07-08 MED ORDER — CHLORHEXIDINE GLUCONATE 0.12% ORAL RINSE (MEDLINE KIT)
15.0000 mL | Freq: Two times a day (BID) | OROMUCOSAL | Status: DC
  Administered 2015-07-08 – 2015-07-19 (×21): 15 mL via OROMUCOSAL

## 2015-07-08 MED ORDER — MIDAZOLAM HCL 2 MG/2ML IJ SOLN
INTRAMUSCULAR | Status: AC
  Administered 2015-07-08: 2 mg via INTRAVENOUS
  Filled 2015-07-08: qty 2

## 2015-07-08 NOTE — Progress Notes (Signed)
PULMONARY / CRITICAL CARE MEDICINE   Name: [name] Terry MRN: [id number] DOB: [birth year]    ADMISSION DATE:  07/16/2015 CONSULTATION DATE:  07/04/15  REFERRING MD:  Fanny Bien  CHIEF COMPLAINT:  Respiratory arrest  HISTORY OF PRESENT ILLNESS:    [name] Terry is 65 yo female with past medical history significant for ischemic cardiomyopathy, stroke, coronary artery disease, lymphoma, CHF  S/p ICD placement ,HTN,HLD, Venticular tachycardia,chronic kidney disease -stage 3, depression, GERD.  Patient presented to ED with chest pain and LHC was not indicative of PCI at the time and was medically managed.  Started on empiric milrinone 07/02/15 with presentation concerning for low output On 4/11 developed  respiratory arrest.  Patient never lost her pulse and was intubated , now on vent. Developed VF arrest 4/11 evening requiring multiple shocks  SUBJECTIVE:  Remains critically ill , intubated, On amio gtt - no arrythmias Afebrile UO picking up on lasix Sedated on fent gtt  VITAL SIGNS: BP 102/63 mmHg  Pulse 60  Temp(Src) 96.7 F (35.9 C) (Axillary)  Resp 12  Ht 5\' 5"  (1.651 m)  Wt 86.4 kg (190 lb 7.6 oz)  BMI 31.70 kg/m2  SpO2 100%  HEMODYNAMICS: CVP:  [9 mmHg-12 mmHg] 12 mmHg  VENTILATOR SETTINGS: Vent Mode:  [-] PRVC FiO2 (%):  [40 %] 40 % Set Rate:  [12 bmp] 12 bmp Vt Set:  [500 mL] 500 mL PEEP:  [5 cmH20] 5 cmH20 Plateau Pressure:  [22 cmH20-24 cmH20] 22 cmH20  INTAKE / OUTPUT: I/O last 3 completed shifts: In: 2269.3 [I.V.:1079.3; Other:190; NG/GT:800; IV Piggyback:200] Out: 3450 [Urine:3450]  PHYSICAL EXAMINATION: General:acutely illappearing black female, intubated  Neuro: sedated, anxious when awake ,RASS 0 to +1 HEENT:  Atraumatic, normocephalic, tongue swelling down Cardiovascular: S1S2, tachycardic, no MRG noted Lungs:crackles bilaterally, no wheezes, rhonchi Abdomen:  Obese, round, BS positive Musculoskeletal:  No inflammation, deformity Skin:  Grossly  intact  LABS:  BMET  Recent Labs Lab 07/06/15 0400 07/07/15 0255 07/08/15 0425  NA 132* 136 138  K 4.8 4.4 3.7  CL 96* 100* 101  CO2 24 26 26   BUN 26* 35* 40*  CREATININE 2.61* 2.89* 2.44*  GLUCOSE 242* 145* 172*    Electrolytes  Recent Labs Lab 07/04/15 1410  07/06/15 0400 07/07/15 0255 07/08/15 0425  CALCIUM 8.2*  < > 8.3* 8.3* 8.4*  MG 2.4  --  1.9 2.2  --   PHOS  --   --  4.0 4.3  --   < > = values in this interval not displayed.  CBC  Recent Labs Lab 07/07/15 0255 07/07/15 0745 07/08/15 0425  WBC 7.4 6.3 6.7  HGB 7.0* 8.2* 8.4*  HCT 22.2* 25.3* 26.8*  PLT 213 177 190    Coag's  Recent Labs Lab 07/06/15 0400 07/07/15 0255 07/08/15 0425  INR 4.97* 5.70* 5.94*    Sepsis Markers No results for input(s): LATICACIDVEN, PROCALCITON, O2SATVEN in the last 168 hours.  ABG  Recent Labs Lab 07/05/15 1027 07/06/15 0527 07/07/15 0423  PHART 7.535* 7.369 7.387  PCO2ART 27.8* 45.0 44.0  PO2ART 93.0 71.0* 129*    Liver Enzymes  Recent Labs Lab 07/04/15 1410 07/05/15 0530 07/08/15 0425  AST 96* 620* 92*  ALT 55* 389* 174*  ALKPHOS 67 66 62  BILITOT 0.8 1.0 0.8  ALBUMIN 2.5* 1.9* 1.9*    Cardiac Enzymes  Recent Labs Lab 07/04/15 1545 07/04/15 2043 07/05/15 0400  TROPONINI 0.08* 0.49* 1.18*    Glucose  Recent Labs Lab 07/07/15  1923 07/08/15 0025 07/08/15 0347 07/08/15 0839 07/08/15 1151 07/08/15 1556  GLUCAP 109* 157* 104* 113* 147* 109*    Imaging Dg Chest Port 1 View  07/08/2015  CLINICAL DATA:  65 year old female with acute respiratory failure EXAM: PORTABLE CHEST 1 VIEW COMPARISON:  Prior chest x-ray 07/07/2015 FINDINGS: The patient is intubated. The tip of the endotracheal tube is 3.1 cm above the carina. Right upper extremity approach PICC. Catheter tip terminates at the superior cavoatrial junction. Left subclavian approach cardiac rhythm maintenance device leads projecting over the right atrium and right ventricle.  Stable cardiomegaly with left heart prominence. Mild pulmonary vascular congestion without overt edema. This represents a slight interval improvement. Probable small bilateral layering pleural effusions and associated bibasilar atelectasis. No pneumothorax. No acute osseous abnormality. IMPRESSION: 1. Stable and satisfactory support apparatus. 2. Slight interval improvement in the pulmonary edema likely secondary to improving CHF. 3. No new acute abnormality. Electronically Signed   By: Jacqulynn Cadet M.D.   On: 07/08/2015 07:16     STUDIES:  4/6 TEE 4/7 RHC And Coronary angiography Distal LAD , 30% stenosed, proximal Circumflex lesion 405 stenosed, proximal circumflex to mid circumflex 50% stenosed,1st Mrg lesion, 75% stenosed,1st RPLB lesion, 99% stenosed.  CULTURES: 4/11 BC >> 4/11 sputum>> nml flora 4/11 urine>> e coli  ANTIBIOTICS:  4/11 zosyn>>  SIGNIFICANT EVENTS:  LINES/TUBES: PICC 4/8>>> 4/11 ET>>>  DISCUSSION: 65 YO female with past medical Hx of  ischemic cardiomyopathy, stroke, coronary artery disease, lymphoma, CHF  S/p ICD placement ,HTN,HLD, Venticular tachycardia,chronic kidney disease -stage 3, depression, GERD, NSTEMI, had repiratory arrested,intubated and currently on vent  ASSESSMENT / PLAN:  PULMONARY A: Acute Hypoxemic respiratory failure Pulmonary edema ?Aspiration  P:   SBTs as tolerated - goal extubation soon now that tongue swelling down CXR improving with diuresis  Duoneb/albuterol   CARDIOVASCULAR A:  NSTEMI Chronic systolic CHF-EF 123456, severe MR Hx of CAD, S/P stenting PAF P:  Off milrinone. On amio gtt   RENAL A:   chronic kidney disease -stage 3 Increased Anion gap acidosis Hyponatremia   P:   Electrolyte replacement as needed. Lasix 80 mg IV BID.  GASTROINTESTINAL A:   C-DIFF- Antigen+, toxin -negative -repeat pending GERD P:  TF per nutrition. Protonix for GIP.  HEMATOLOGIC A:   Coagulopathy P:  SCD's Heparin  GTT stopped by cards. Since INR high Hold oral coumadin. Transfuse if HgB<7  INFECTIOUS A:   Aspiration PNA E coli UTI P:   Ct zosyn -await sens  ENDOCRINE A:   Diabetese Hypothyroidism P:   SSI coverage. Levothyroxine -via tube  NEUROLOGIC A:   Hx OF Depression/ anxiety P:   RASS goal -1. Hold paxil/seroquel.  FAMILY  - Updates: No family bedside.  - Inter-disciplinary family meet or Palliative Care meeting due by: 07/10/15.   Summary - Acute systolic CHF with VF -arrest , on amio gtt, likely tongue bite, wean with goal extubation once diuresed  The patient is critically ill with multiple organ systems failure and requires high complexity decision making for assessment and support, frequent evaluation and titration of therapies, application of advanced monitoring technologies and extensive interpretation of multiple databases. Critical Care Time devoted to patient care services described in this note independent of APP time is 35 minutes.    Kara Mead MD. Shade Flood. [facility] Pager [phone] If no response call 319 [phone]   07/08/2015

## 2015-07-08 NOTE — Progress Notes (Signed)
Advanced Heart Failure Rounding Note  PCP: Karna Dupes, PA-C Primary Cardiologist: Johnsie Cancel EP: Allred  Subjective:   This is a 65 year old female patient with h/o of CAD, HTN, ICM s/p MDT dual chamber ICD 2013, lymphoma s/p chemo/radiation 5809, chronic systolic CHF, status post VT ablation 2, HTN, HLD, VT, DM, CKD stage III, PMR and depression who presented to [facility] with CP and SOB  Started on empiric milrinone 07/02/15 with presentation concerning for low output.  PICC placed. On 4/11 developed respiratory distress requiring intubation and bronchoscopy. After intubation she was hypotensive and started on dopamine and continued on milrinone. Unfortunately later that evening she had V fib arrest multiple shocks. Milrinone and dopamine stopped. Started on lidocaine and amio drip.   Overnight no events. No VT.  Diuresing well. Trying to wean from vent. Tongue swelling improved. Remains agitated  Todays CO-OX 62%. CVP 7 Creatinine improved 2.1>2.3>2.6 >2.9>2.4. CXR clearing    Objective:   Weight Range: 86.4 kg (190 lb 7.6 oz) Body mass index is 31.7 kg/(m^2).   Vital Signs:   Temp:  [97 F (36.1 C)-98 F (36.7 C)] 98 F (36.7 C) (04/15 0336) Pulse Rate:  [59-82] 67 (04/15 0815) Resp:  [12-22] 13 (04/15 0700) BP: (96-147)/(60-91) 125/78 mmHg (04/15 0815) SpO2:  [100 %] 100 % (04/15 0815) FiO2 (%):  [40 %] 40 % (04/15 0815) Weight:  [86.4 kg (190 lb 7.6 oz)] 86.4 kg (190 lb 7.6 oz) (04/15 0336) Last BM Date: 07/04/15  Weight change: Filed Weights   07/06/15 0412 07/07/15 0500 07/08/15 0336  Weight: 86.8 kg (191 lb 5.8 oz) 86 kg (189 lb 9.5 oz) 86.4 kg (190 lb 7.6 oz)    Intake/Output:   Intake/Output Summary (Last 24 hours) at 07/08/15 0850 Last data filed at 07/08/15 0700  Gross per 24 hour  Intake 1529.23 ml  Output   2200 ml  Net -670.77 ml     Physical Exam: CVP 7 General: Intubated. Awake. Agitated HEENT: ETT e Neck: supple. Carotids 2+ bilat; no  bruits. No thyromegaly or nodule noted. Cor: PMI nondisplaced. RRR. No rubs or gallops. MR murmur Lungs: Coarse throughout Abdomen: soft, NT, ND, no HSM. No bruits or masses. +BS  Extremities: no cyanosis, clubbing, rash, edema. No nodule or warmth Neuro: Intubated agitated GU: Foley.   Telemetry: SR 70s    Labs: CBC  Recent Labs  07/07/15 0745 07/08/15 0425  WBC 6.3 6.7  NEUTROABS 5.0  --   HGB 8.2* 8.4*  HCT 25.3* 26.8*  MCV 92.0 91.2  PLT 177 983   Basic Metabolic Panel  Recent Labs  07/06/15 0400 07/07/15 0255 07/08/15 0425  NA 132* 136 138  K 4.8 4.4 3.7  CL 96* 100* 101  CO2 '24 26 26  '$ GLUCOSE 242* 145* 172*  BUN 26* 35* 40*  CREATININE 2.61* 2.89* 2.44*  CALCIUM 8.3* 8.3* 8.4*  MG 1.9 2.2  --   PHOS 4.0 4.3  --    Liver Function Tests  Recent Labs  07/08/15 0425  AST 92*  ALT 174*  ALKPHOS 62  BILITOT 0.8  PROT 5.6*  ALBUMIN 1.9*   No results for input(s): LIPASE, AMYLASE in the last 72 hours. Cardiac Enzymes No results for input(s): CKTOTAL, CKMB, CKMBINDEX, TROPONINI in the last 72 hours.  BNP: BNP (last 3 results)  Recent Labs  02/21/15 1042 03/01/15 0839 04/13/15 2215  BNP 386.1* 452.0* 894.0*    ProBNP (last 3 results) No results for input(s): PROBNP in  the last 8760 hours.   D-Dimer No results for input(s): DDIMER in the last 72 hours. Hemoglobin A1C No results for input(s): HGBA1C in the last 72 hours. Fasting Lipid Panel No results for input(s): CHOL, HDL, LDLCALC, TRIG, CHOLHDL, LDLDIRECT in the last 72 hours. Thyroid Function Tests No results for input(s): TSH, T4TOTAL, T3FREE, THYROIDAB in the last 72 hours.  Invalid input(s): FREET3  Other results:     Imaging/Studies:  Dg Chest Port 1 View  07/08/2015  CLINICAL DATA:  65 year old female with acute respiratory failure EXAM: PORTABLE CHEST 1 VIEW COMPARISON:  Prior chest x-ray 07/07/2015 FINDINGS: The patient is intubated. The tip of the endotracheal tube is  3.1 cm above the carina. Right upper extremity approach PICC. Catheter tip terminates at the superior cavoatrial junction. Left subclavian approach cardiac rhythm maintenance device leads projecting over the right atrium and right ventricle. Stable cardiomegaly with left heart prominence. Mild pulmonary vascular congestion without overt edema. This represents a slight interval improvement. Probable small bilateral layering pleural effusions and associated bibasilar atelectasis. No pneumothorax. No acute osseous abnormality. IMPRESSION: 1. Stable and satisfactory support apparatus. 2. Slight interval improvement in the pulmonary edema likely secondary to improving CHF. 3. No new acute abnormality. Electronically Signed   By: Jacqulynn Cadet M.D.   On: 07/08/2015 07:16   Dg Chest Port 1 View  07/07/2015  CLINICAL DATA:  Cardiomyopathy, cardiac dysrhythmia, acute and chronic CHF EXAM: PORTABLE CHEST 1 VIEW COMPARISON:  Portable chest x-ray of July 06, 2015 FINDINGS: The lungs remain mildly hypoinflated. The interstitial markings remain increased in the mid and lower lungs bilaterally. The pulmonary vascularity remains engorged. The cardiac silhouette remains enlarged. The endotracheal tube tip lies 3.5 cm above the carina. The esophagogastric tube tip projects below the inferior margin of the image. The pacemaker defibrillator is in stable position. IMPRESSION: There has not been significant interval change in the appearance of the chest since yesterday's study. Persistent CHF with probable bibasilar atelectasis and small left pleural effusion. Electronically Signed   By: David  Martinique M.D.   On: 07/07/2015 07:26    Latest Echo  Latest Cath   Medications:     Scheduled Medications: . allopurinol  100 mg Per Tube Daily  . antiseptic oral rinse  7 mL Mouth Rinse QID  . aspirin  81 mg Per Tube Daily  . chlorhexidine gluconate (SAGE KIT)  15 mL Mouth Rinse BID  . colchicine  0.3 mg Oral Daily  .  famotidine  20 mg Per Tube Daily  . feeding supplement (PRO-STAT SUGAR FREE 64)  30 mL Per Tube BID  . feeding supplement (VITAL HIGH PROTEIN)  1,000 mL Per Tube Q24H  . ferrous sulfate  300 mg Per Tube BID WC  . furosemide  80 mg Intravenous BID  . insulin aspart  0-9 Units Subcutaneous 6 times per day  . levothyroxine  100 mcg Per Tube QAC breakfast  . PARoxetine  10 mg Oral Daily  . piperacillin-tazobactam (ZOSYN)  IV  3.375 g Intravenous 3 times per day  . potassium chloride  40 mEq Oral BID  . QUEtiapine  25 mg Oral QHS  . sodium chloride flush  10-40 mL Intracatheter Q12H  . sodium chloride flush  3 mL Intravenous Q12H  . sodium chloride flush  3 mL Intravenous Q12H    Infusions: . sodium chloride 10 mL/hr at 07/07/15 2000  . amiodarone 30 mg/hr (07/08/15 0018)  . fentaNYL infusion INTRAVENOUS 125 mcg/hr (07/08/15 0200)  .  milrinone Stopped (07/04/15 1745)    PRN Medications: sodium chloride, [DISCONTINUED] acetaminophen **OR** acetaminophen, acetaminophen, albuterol, diphenhydrAMINE **OR** diphenhydrAMINE, fentaNYL, nitroGLYCERIN, ondansetron (ZOFRAN) IV, ondansetron **OR** [DISCONTINUED] ondansetron (ZOFRAN) IV, promethazine, sodium chloride flush, sodium chloride flush   Assessment   1. Respiratory Arrest 2. VF Arrest 3.Acute on chronic combined CHF - EF 20-25% w/ diffuse HK and akinesis of the inferolateral and inferior myocardium, G2DD, severe MR, mild RV dysfunction/dilation, + atrial septal anuerysm 4. Severe MR - by TEE 5.  Severe Pulm HTN - PA pressure 90 mm Hg 6.  PAF 7. AKI on CKD stage IV  8. Chronic anemia - workup in 03/2014 unremarkable GI bleed. 9. DM2 10. HLD 11. Hypokalemia 12. LCB-  No external shocks. [place] for meds and drugs.    Plan    Remains intubated. CVP down to 7. CXR clearing.  Co-ox ok. No further VT. Continue amio.   Renal function improving. K 3.7. INR 5.9.  Will continue IV diuresis one more day. Supp K. Hopefully we can extubate  soon. D/w Dr. Elsworth Soho.   The patient is critically ill with multiple organ systems failure and requires high complexity decision making for assessment and support, frequent evaluation and titration of therapies, application of advanced monitoring technologies and extensive interpretation of multiple databases.   Critical Care Time devoted to patient care services described in this note is 35 Minutes.  Length of Stay: 11  Glori Bickers MD  07/08/2015, 8:50 AM  Advanced Heart Failure Team Pager [phone] (M-F; [place])  Please contact [facility] for night-coverage after hours (4p -7a ) and weekends on amion

## 2015-07-09 ENCOUNTER — Inpatient Hospital Stay (HOSPITAL_COMMUNITY): Payer: Medicare Other

## 2015-07-09 LAB — GLUCOSE, CAPILLARY
Glucose-Capillary: 135 mg/dL — ABNORMAL HIGH (ref 65–99)
Glucose-Capillary: 132 mg/dL — ABNORMAL HIGH (ref 65–99)
Glucose-Capillary: 108 mg/dL — ABNORMAL HIGH (ref 65–99)
Glucose-Capillary: 153 mg/dL — ABNORMAL HIGH (ref 65–99)
Glucose-Capillary: 185 mg/dL — ABNORMAL HIGH (ref 65–99)

## 2015-07-09 LAB — CARBOXYHEMOGLOBIN
CARBOXYHEMOGLOBIN: 1.6 % — AB (ref 0.5–1.5)
METHEMOGLOBIN: 0.6 % (ref 0.0–1.5)
O2 Saturation: 76.2 %
TOTAL HEMOGLOBIN: 7.5 g/dL — AB (ref 12.0–16.0)

## 2015-07-09 LAB — CULTURE, BLOOD (ROUTINE X 2)
CULTURE: NO GROWTH
CULTURE: NO GROWTH

## 2015-07-09 LAB — CBC
HEMATOCRIT: 27.1 % — AB (ref 36.0–46.0)
Hemoglobin: 8.3 g/dL — ABNORMAL LOW (ref 12.0–15.0)
MCH: 28.5 pg (ref 26.0–34.0)
MCHC: 30.6 g/dL (ref 30.0–36.0)
MCV: 93.1 fL (ref 78.0–100.0)
Platelets: 189 10*3/uL (ref 150–400)
RBC: 2.91 MIL/uL — ABNORMAL LOW (ref 3.87–5.11)
RDW: 18.6 % — AB (ref 11.5–15.5)
WBC: 4.5 10*3/uL (ref 4.0–10.5)

## 2015-07-09 LAB — PROTIME-INR
INR: 5.68 — AB (ref 0.00–1.49)
Prothrombin Time: 49.4 seconds — ABNORMAL HIGH (ref 11.6–15.2)

## 2015-07-09 LAB — BASIC METABOLIC PANEL
Anion gap: 9 (ref 5–15)
BUN: 43 mg/dL — AB (ref 6–20)
CO2: 29 mmol/L (ref 22–32)
Calcium: 8.7 mg/dL — ABNORMAL LOW (ref 8.9–10.3)
Chloride: 105 mmol/L (ref 101–111)
Creatinine, Ser: 2.36 mg/dL — ABNORMAL HIGH (ref 0.44–1.00)
GFR calc Af Amer: 24 mL/min — ABNORMAL LOW (ref >60)
GFR, EST NON AFRICAN AMERICAN: 21 mL/min — AB (ref >60)
GLUCOSE: 146 mg/dL — AB (ref 65–99)
POTASSIUM: 3.7 mmol/L (ref 3.5–5.1)
Sodium: 143 mmol/L (ref 135–145)

## 2015-07-09 LAB — C DIFFICILE QUICK SCREEN W PCR REFLEX
C DIFFICILE (CDIFF) TOXIN: NEGATIVE
C Diff antigen: POSITIVE — AB

## 2015-07-09 MED ORDER — FENTANYL BOLUS VIA INFUSION
75.0000 ug | Freq: Once | INTRAVENOUS | Status: AC
  Administered 2015-07-09: 75 ug via INTRAVENOUS
  Filled 2015-07-09: qty 75

## 2015-07-09 MED ORDER — SODIUM CHLORIDE 0.9 % IV SOLN
Freq: Once | INTRAVENOUS | Status: AC
  Administered 2015-07-09: 17:00:00 via INTRAVENOUS

## 2015-07-09 MED ORDER — MIDAZOLAM HCL 2 MG/2ML IJ SOLN
3.0000 mg | Freq: Once | INTRAMUSCULAR | Status: AC
  Administered 2015-07-08: 2 mg via INTRAVENOUS

## 2015-07-09 MED ORDER — MIDAZOLAM HCL 5 MG/ML IJ SOLN: 3.0000 mg | Freq: Once | INTRAMUSCULAR | Status: DC

## 2015-07-09 NOTE — Progress Notes (Signed)
eLink Physician-Brief Progress Note Patient Name: [name] Terry DOB: 14-Sep-1950 MRN: [id number]   Date of Service  07/09/2015  HPI/Events of Note  Hemoptysis - INR = 5.68 this AM. Not currently on Warfarin.   eICU Interventions  Will order: 1. Increase PEEP to 10. 2. Transfuse 3 units FFP. 3. Repeat PT/INR at 11 PM post FFP infusion.      Intervention Category Major Interventions: Other:  Lysle Dingwall 07/09/2015, 3:51 PM

## 2015-07-09 NOTE — Progress Notes (Signed)
INR 5.68 today. Improving from previous. Dr. Johnette Abraham. Deter[place] notified. No advert signs of bleeding. Will continue to monitor.

## 2015-07-09 NOTE — Progress Notes (Signed)
Advanced Heart Failure Rounding Note  PCP: Karna Dupes, PA-C Primary Cardiologist: Johnsie Cancel EP: Allred  Subjective:   This is a 65 year old female patient with h/o of CAD, HTN, ICM s/p MDT dual chamber ICD 2013, lymphoma s/p chemo/radiation 3254, chronic systolic CHF, status post VT ablation 2, HTN, HLD, VT, DM, CKD stage III, PMR and depression who presented to [facility] with CP and SOB  Started on empiric milrinone 07/02/15 with presentation concerning for low output.  PICC placed. On 4/11 developed respiratory distress requiring intubation and bronchoscopy. After intubation she was hypotensive and started on dopamine and continued on milrinone. Unfortunately later that evening she had V fib arrest multiple shocks. Milrinone and dopamine stopped. Started on lidocaine and amio drip.   Overnight no events. No VT.  Diuresing well. Trying to wean from vent but lung volumes low. Tongue swelling improved. Remains agitated  Todays CO-OX 76%. CVP 7-8 Creatinine improved 2.1>2.3>2.6 >2.9>2.4>2.3. CXR clearing    Objective:   Weight Range: 84.9 kg (187 lb 2.7 oz) Body mass index is 31.15 kg/(m^2).   Vital Signs:   Temp:  [96.7 F (35.9 C)-98.1 F (36.7 C)] 98.1 F (36.7 C) (04/16 0800) Pulse Rate:  [59-95] 95 (04/16 1000) Resp:  [11-23] 16 (04/16 1000) BP: (91-162)/(57-94) 162/94 mmHg (04/16 1000) SpO2:  [96 %-100 %] 96 % (04/16 1000) FiO2 (%):  [40 %] 40 % (04/16 0917) Weight:  [84.9 kg (187 lb 2.7 oz)] 84.9 kg (187 lb 2.7 oz) (04/16 0000) Last BM Date: 07/09/15  Weight change: Filed Weights   07/07/15 0500 07/08/15 0336 07/09/15 0000  Weight: 86 kg (189 lb 9.5 oz) 86.4 kg (190 lb 7.6 oz) 84.9 kg (187 lb 2.7 oz)    Intake/Output:   Intake/Output Summary (Last 24 hours) at 07/09/15 1042 Last data filed at 07/09/15 1000  Gross per 24 hour  Intake 3042.63 ml  Output   1350 ml  Net 1692.63 ml     Physical Exam: CVP 7-8 General: Intubated. Awake. Agitated HEENT: ETT  tongue swelling improving Neck: supple. Carotids 2+ bilat; no bruits. No thyromegaly or nodule noted. Cor: PMI nondisplaced. RRR. No rubs or gallops. MR murmur Lungs: Coarse throughout Abdomen: soft, NT, ND, no HSM. No bruits or masses. +BS  Extremities: no cyanosis, clubbing, rash, edema. No nodule or warmth Neuro: Intubated agitated GU: Foley.   Telemetry: SR 70s    Labs: CBC  Recent Labs  07/07/15 0745 07/08/15 0425 07/09/15 0416  WBC 6.3 6.7 4.5  NEUTROABS 5.0  --   --   HGB 8.2* 8.4* 8.3*  HCT 25.3* 26.8* 27.1*  MCV 92.0 91.2 93.1  PLT 177 190 982   Basic Metabolic Panel  Recent Labs  07/07/15 0255 07/08/15 0425 07/09/15 0416  NA 136 138 143  K 4.4 3.7 3.7  CL 100* 101 105  CO2 _0 GLUCOSE 145* 172* 146*  BUN 35* 40* 43*  CREATININE 2.89* 2.44* 2.36*  CALCIUM 8.3* 8.4* 8.7*  MG 2.2  --   --   PHOS 4.3  --   --    Liver Function Tests  Recent Labs  07/08/15 0425  AST 92*  ALT 174*  ALKPHOS 62  BILITOT 0.8  PROT 5.6*  ALBUMIN 1.9*   No results for input(s): LIPASE, AMYLASE in the last 72 hours. Cardiac Enzymes No results for input(s): CKTOTAL, CKMB, CKMBINDEX, TROPONINI in the last 72 hours.  BNP: BNP (last 3 results)  Recent Labs  02/21/15 1042 03/01/15  1914 04/13/15 2215  BNP 386.1* 452.0* 894.0*    ProBNP (last 3 results) No results for input(s): PROBNP in the last 8760 hours.   D-Dimer No results for input(s): DDIMER in the last 72 hours. Hemoglobin A1C No results for input(s): HGBA1C in the last 72 hours. Fasting Lipid Panel No results for input(s): CHOL, HDL, LDLCALC, TRIG, CHOLHDL, LDLDIRECT in the last 72 hours. Thyroid Function Tests No results for input(s): TSH, T4TOTAL, T3FREE, THYROIDAB in the last 72 hours.  Invalid input(s): FREET3  Other results:     Imaging/Studies:  Dg Chest Port 1 View  07/09/2015  CLINICAL DATA:  65 year old female with history of acute respiratory failure. EXAM: PORTABLE CHEST  1 VIEW COMPARISON:  Chest x-ray 07/08/2015. FINDINGS: An endotracheal tube is in place with tip 3.8 cm above the carina. A nasogastric tube is seen extending into the stomach, however, the tip of the nasogastric tube extends below the lower margin of the image. There is a right upper extremity PICC with tip terminating in the superior cavoatrial junction. Left-sided pacemaker/AICD with lead tips projecting over the expected location of the right atrium and right ventricular apex. Lung volumes are low. There are bibasilar opacities which are favored to predominantly reflect subsegmental atelectasis. There continues to be some mild interstitial prominence. No acute consolidative airspace disease. No pleural effusions. Previously noted cephalization of the pulmonary vasculature appears to be decreasing. Mild cardiomegaly. Upper mediastinal contours are within normal limits. Atherosclerosis in the thoracic aorta. IMPRESSION: 1. Support apparatus, as above. 2. Low lung volumes with what appears to be resolving pulmonary edema. 3. Mild cardiomegaly. 4. Atherosclerosis. Electronically Signed   By: Vinnie Langton M.D.   On: 07/09/2015 07:54   Dg Chest Port 1 View  07/08/2015  CLINICAL DATA:  65 year old female with acute respiratory failure EXAM: PORTABLE CHEST 1 VIEW COMPARISON:  Prior chest x-ray 07/07/2015 FINDINGS: The patient is intubated. The tip of the endotracheal tube is 3.1 cm above the carina. Right upper extremity approach PICC. Catheter tip terminates at the superior cavoatrial junction. Left subclavian approach cardiac rhythm maintenance device leads projecting over the right atrium and right ventricle. Stable cardiomegaly with left heart prominence. Mild pulmonary vascular congestion without overt edema. This represents a slight interval improvement. Probable small bilateral layering pleural effusions and associated bibasilar atelectasis. No pneumothorax. No acute osseous abnormality. IMPRESSION: 1.  Stable and satisfactory support apparatus. 2. Slight interval improvement in the pulmonary edema likely secondary to improving CHF. 3. No new acute abnormality. Electronically Signed   By: Jacqulynn Cadet M.D.   On: 07/08/2015 07:16    Latest Echo  Latest Cath   Medications:     Scheduled Medications: . allopurinol  100 mg Per Tube Daily  . antiseptic oral rinse  7 mL Mouth Rinse 10 times per day  . aspirin  81 mg Per Tube Daily  . chlorhexidine gluconate (SAGE KIT)  15 mL Mouth Rinse BID  . colchicine  0.3 mg Oral Daily  . famotidine  20 mg Per Tube Daily  . feeding supplement (PRO-STAT SUGAR FREE 64)  30 mL Per Tube BID  . feeding supplement (VITAL HIGH PROTEIN)  1,000 mL Per Tube Q24H  . ferrous sulfate  300 mg Per Tube BID WC  . furosemide  80 mg Intravenous BID  . insulin aspart  0-9 Units Subcutaneous 6 times per day  . levothyroxine  100 mcg Per Tube QAC breakfast  . PARoxetine  10 mg Oral Daily  . piperacillin-tazobactam (ZOSYN)  IV  3.375 g Intravenous 3 times per day  . potassium chloride  40 mEq Oral BID  . QUEtiapine  25 mg Oral QHS  . sodium chloride flush  10-40 mL Intracatheter Q12H    Infusions: . sodium chloride 10 mL/hr at 07/08/15 2215  . amiodarone 30 mg/hr (07/09/15 0756)  . fentaNYL infusion INTRAVENOUS Stopped (07/09/15 0800)    PRN Medications: [DISCONTINUED] acetaminophen **OR** acetaminophen, acetaminophen, albuterol, diphenhydrAMINE **OR** diphenhydrAMINE, fentaNYL, midazolam, nitroGLYCERIN, ondansetron (ZOFRAN) IV, ondansetron **OR** [DISCONTINUED] ondansetron (ZOFRAN) IV, promethazine, sodium chloride flush   Assessment   1. Respiratory Arrest 2. VF Arrest 3.Acute on chronic combined CHF - EF 20-25% w/ diffuse HK and akinesis of the inferolateral and inferior myocardium, G2DD, severe MR, mild RV dysfunction/dilation, + atrial septal anuerysm 4. Severe MR - by TEE 5.  Severe Pulm HTN - PA pressure 90 mm Hg 6.  PAF 7. AKI on CKD stage IV    8. Chronic anemia - workup in 03/2014 unremarkable GI bleed. 9. DM2 10. HLD 11. Hypokalemia 12. LCB-  No external shocks. [place] for meds and drugs.    Plan    Remains intubated. CVP down to 7-8. CXR with continued clearing.  Co-ox ok. No further VT. Continue amio.   Renal function improving. K 3.7. INR 5.7  Will continue IV diuresis one more day. Supp K. Continue vent wean per CCM.   The patient is critically ill with multiple organ systems failure and requires high complexity decision making for assessment and support, frequent evaluation and titration of therapies, application of advanced monitoring technologies and extensive interpretation of multiple databases.   Critical Care Time devoted to patient care services described in this note is 35 Minutes.  Length of Stay: 12  Glori Bickers MD  07/09/2015, 10:42 AM  Advanced Heart Failure Team Pager [phone] (M-F; 7a - 4p)  Please contact [facility] for night-coverage after hours (4p -7a ) and weekends on amion

## 2015-07-09 NOTE — Progress Notes (Signed)
RT was called to patient room due to patient beginning to have a drop in sats and RN began to suction out a copious amount of bright, red blood from ETT.  RN was bagging patient upon arrival to room.  Patient was weaning on PSV/CPAP prior to event.  Bag lavaged patient and removed moderate amount of bright red blood from ETT.  Patient was placed back on ventilator, on full support, with an increase in FIO2 to 100% and increase in PEEP to 10 per MD order.  Patient sats improved to 96% prior to leaving room.  Will continue to monitor patient.

## 2015-07-09 NOTE — Progress Notes (Signed)
Pt became agitated after PICC line dressing change. Began to cough. Upon suctioning, pt began to gag. Vomited a large amount of tan/ yellow emesis with smell of tube feeds. Tube feeds stopped and pt suctioned orally and through ETT. ETT secretions bright red. Pt sats began to drop without improvement on 100% FiO2. Began bagging pt. Elink and RT paged. New orders received.

## 2015-07-09 NOTE — Progress Notes (Signed)
PULMONARY / CRITICAL CARE MEDICINE   Name: [name] Terry MRN: [id number] DOB: 1950-04-20    ADMISSION DATE:  07/20/2015 CONSULTATION DATE:  07/04/15  REFERRING MD:  Fanny Bien  CHIEF COMPLAINT:  Respiratory arrest  HISTORY OF PRESENT ILLNESS:    [name] Terry is 65 yo female with past medical history significant for ischemic cardiomyopathy, stroke, coronary artery disease, lymphoma, CHF  S/p ICD placement ,HTN,HLD, Venticular tachycardia,chronic kidney disease -stage 3, depression, GERD.  Patient presented to ED with chest pain and LHC was not indicative of PCI at the time and was medically managed.  Started on empiric milrinone 07/02/15 with presentation concerning for low output On 4/11 developed  respiratory arrest.  Patient never lost her pulse and was intubated , now on vent. Developed VF arrest 4/11 evening requiring multiple shocks  SUBJECTIVE:  Remains critically ill , intubated,  Agitated - wants ETT out On amio gtt - no arrythmias Afebrile Sedation off   VITAL SIGNS: BP 103/64 mmHg  Pulse 61  Temp(Src) 98.1 F (36.7 C) (Oral)  Resp 23  Ht 5\' 5"  (1.651 m)  Wt 187 lb 2.7 oz (84.9 kg)  BMI 31.15 kg/m2  SpO2 100%  HEMODYNAMICS: CVP:  [8 mmHg-12 mmHg] 9 mmHg  VENTILATOR SETTINGS: Vent Mode:  [-] PRVC FiO2 (%):  [40 %] 40 % Set Rate:  [12 bmp] 12 bmp Vt Set:  [500 mL] 500 mL PEEP:  [5 cmH20] 5 cmH20 Pressure Support:  [15 cmH20] 15 cmH20 Plateau Pressure:  [22 cmH20-23 cmH20] 22 cmH20  INTAKE / OUTPUT: I/O last 3 completed shifts: In: 3563.2 [I.V.:1533.2; Other:190; NG/GT:1590; IV Piggyback:250] Out: [id number] [Urine:3325]  PHYSICAL EXAMINATION: General:acutely illappearing black female, intubated  Neuro: sedated, anxious when awake ,RASS 0 to +2 HEENT:  Atraumatic, normocephalic, tongue swelling down Cardiovascular: S1S2, tachycardic, no MRG noted Lungs:crackles bilaterally, no wheezes, rhonchi Abdomen:  Obese, round, BS positive Musculoskeletal:  No inflammation,  deformity Skin:  Grossly intact  LABS:  BMET  Recent Labs Lab 07/07/15 0255 07/08/15 0425 07/09/15 0416  NA 136 138 143  K 4.4 3.7 3.7  CL 100* 101 105  CO2 26 26 29   BUN 35* 40* 43*  CREATININE 2.89* 2.44* 2.36*  GLUCOSE 145* 172* 146*    Electrolytes  Recent Labs Lab 07/04/15 1410  07/06/15 0400 07/07/15 0255 07/08/15 0425 07/09/15 0416  CALCIUM 8.2*  < > 8.3* 8.3* 8.4* 8.7*  MG 2.4  --  1.9 2.2  --   --   PHOS  --   --  4.0 4.3  --   --   < > = values in this interval not displayed.  CBC  Recent Labs Lab 07/07/15 0745 07/08/15 0425 07/09/15 0416  WBC 6.3 6.7 4.5  HGB 8.2* 8.4* 8.3*  HCT 25.3* 26.8* 27.1*  PLT 177 190 189    Coag's  Recent Labs Lab 07/07/15 0255 07/08/15 0425 07/09/15 0416  INR 5.70* 5.94* 5.68*    Sepsis Markers No results for input(s): LATICACIDVEN, PROCALCITON, O2SATVEN in the last 168 hours.  ABG  Recent Labs Lab 07/05/15 1027 07/06/15 0527 07/07/15 0423  PHART 7.535* 7.369 7.387  PCO2ART 27.8* 45.0 44.0  PO2ART 93.0 71.0* 129*    Liver Enzymes  Recent Labs Lab 07/04/15 1410 07/05/15 0530 07/08/15 0425  AST 96* 620* 92*  ALT 55* 389* 174*  ALKPHOS 67 66 62  BILITOT 0.8 1.0 0.8  ALBUMIN 2.5* 1.9* 1.9*    Cardiac Enzymes  Recent Labs Lab 07/04/15 1545 07/04/15 2043  07/05/15 0400  TROPONINI 0.08* 0.49* 1.18*    Glucose  Recent Labs Lab 07/08/15 0839 07/08/15 1151 07/08/15 1556 07/08/15 1932 07/09/15 0014 07/09/15 0413  GLUCAP 113* 147* 109* 146* 135* 132*    Imaging Dg Chest Port 1 View  07/09/2015  CLINICAL DATA:  65 year old female with history of acute respiratory failure. EXAM: PORTABLE CHEST 1 VIEW COMPARISON:  Chest x-ray 07/08/2015. FINDINGS: An endotracheal tube is in place with tip 3.8 cm above the carina. A nasogastric tube is seen extending into the stomach, however, the tip of the nasogastric tube extends below the lower margin of the image. There is a right upper extremity  PICC with tip terminating in the superior cavoatrial junction. Left-sided pacemaker/AICD with lead tips projecting over the expected location of the right atrium and right ventricular apex. Lung volumes are low. There are bibasilar opacities which are favored to predominantly reflect subsegmental atelectasis. There continues to be some mild interstitial prominence. No acute consolidative airspace disease. No pleural effusions. Previously noted cephalization of the pulmonary vasculature appears to be decreasing. Mild cardiomegaly. Upper mediastinal contours are within normal limits. Atherosclerosis in the thoracic aorta. IMPRESSION: 1. Support apparatus, as above. 2. Low lung volumes with what appears to be resolving pulmonary edema. 3. Mild cardiomegaly. 4. Atherosclerosis. Electronically Signed   By: Vinnie Langton M.D.   On: 07/09/2015 07:54     STUDIES:  4/6 TEE 4/7 RHC And Coronary angiography Distal LAD , 30% stenosed, proximal Circumflex lesion 405 stenosed, proximal circumflex to mid circumflex 50% stenosed,1st Mrg lesion, 75% stenosed,1st RPLB lesion, 99% stenosed.  CULTURES: 4/11 BC >> 4/11 sputum>> nml flora 4/11 urine>> e coli  ANTIBIOTICS:  4/11 zosyn>>  SIGNIFICANT EVENTS:  LINES/TUBES: PICC 4/8>>> 4/11 ET>>>  DISCUSSION: 65 YO female with past medical Hx of  ischemic cardiomyopathy, stroke, coronary artery disease, lymphoma, CHF  S/p ICD placement ,HTN,HLD, Venticular tachycardia,chronic kidney disease -stage 3, depression, GERD, NSTEMI, had repiratory arrested,intubated and currently on vent  ASSESSMENT / PLAN:  PULMONARY A: Acute Hypoxemic respiratory failure Pulmonary edema ?Aspiration  P:   SBTs as tolerated - goal extubation soon now that tongue swelling down CXR improving with diuresis  Duoneb/albuterol   CARDIOVASCULAR A:  NSTEMI Chronic systolic CHF-EF 123456, severe MR Hx of CAD, S/P stenting PAF P:  Off milrinone. On amio gtt   RENAL A:    chronic kidney disease -stage 3 Increased Anion gap acidosis Hyponatremia -resolved  P:   Electrolyte replacement as needed. Lasix 80 mg IV BID.  GASTROINTESTINAL A:  Diarrhea C-DIFF- Antigen+, toxin -negative -repeat pending GERD P:  TF per nutrition. Protonix for GIP.  HEMATOLOGIC A:   Coagulopathy P:  SCD's Heparin GTT stopped by cards. Since INR high Hold oral coumadin. Transfuse if HgB<7  INFECTIOUS A:   Aspiration PNA E coli UTI P:   Ct zosyn -await sens  ENDOCRINE A:   Diabetese Hypothyroidism P:   SSI coverage. Levothyroxine -via tube  NEUROLOGIC A:   Hx OF Depression/ anxiety P:   RASS goal 0. Hold paxil/ ct seroquel.  FAMILY  - Updates: No family bedside.  - Inter-disciplinary family meet or Palliative Care meeting due by: 07/10/15.   Summary - Acute systolic CHF with VF -arrest , on amio gtt, likely tongue bite, wean with goal extubation once diuresed  The patient is critically ill with multiple organ systems failure and requires high complexity decision making for assessment and support, frequent evaluation and titration of therapies, application of advanced monitoring technologies  and extensive interpretation of multiple databases. Critical Care Time devoted to patient care services described in this note independent of APP time is 32 minutes.    Kara Mead MD. [place].  Pulmonary & Critical care Pager [phone] If no response call [phone]   07/09/2015

## 2015-07-10 ENCOUNTER — Inpatient Hospital Stay (HOSPITAL_COMMUNITY): Payer: Medicare Other

## 2015-07-10 DIAGNOSIS — R042 Hemoptysis: Secondary | ICD-10-CM

## 2015-07-10 LAB — PROTIME-INR
INR: 3.07 — ABNORMAL HIGH (ref 0.00–1.49)
INR: 2.95 — ABNORMAL HIGH (ref 0.00–1.49)
PROTHROMBIN TIME: 30.3 s — AB (ref 11.6–15.2)
Prothrombin Time: 31.1 seconds — ABNORMAL HIGH (ref 11.6–15.2)

## 2015-07-10 LAB — GLUCOSE, CAPILLARY
GLUCOSE-CAPILLARY: 158 mg/dL — AB (ref 65–99)
GLUCOSE-CAPILLARY: 114 mg/dL — AB (ref 65–99)
GLUCOSE-CAPILLARY: 133 mg/dL — AB (ref 65–99)
Glucose-Capillary: 117 mg/dL — ABNORMAL HIGH (ref 65–99)
Glucose-Capillary: 125 mg/dL — ABNORMAL HIGH (ref 65–99)
Glucose-Capillary: 126 mg/dL — ABNORMAL HIGH (ref 65–99)
Glucose-Capillary: 112 mg/dL — ABNORMAL HIGH (ref 65–99)
Glucose-Capillary: 123 mg/dL — ABNORMAL HIGH (ref 65–99)

## 2015-07-10 LAB — BASIC METABOLIC PANEL
Anion gap: 11 (ref 5–15)
BUN: 48 mg/dL — ABNORMAL HIGH (ref 6–20)
CALCIUM: 8.8 mg/dL — AB (ref 8.9–10.3)
CO2: 29 mmol/L (ref 22–32)
CREATININE: 2.62 mg/dL — AB (ref 0.44–1.00)
Chloride: 104 mmol/L (ref 101–111)
GFR, EST AFRICAN AMERICAN: 21 mL/min — AB (ref >60)
GFR, EST NON AFRICAN AMERICAN: 18 mL/min — AB (ref >60)
Glucose, Bld: 135 mg/dL — ABNORMAL HIGH (ref 65–99)
Potassium: 4.2 mmol/L (ref 3.5–5.1)
SODIUM: 144 mmol/L (ref 135–145)

## 2015-07-10 LAB — CARBOXYHEMOGLOBIN
CARBOXYHEMOGLOBIN: 1.8 % — AB (ref 0.5–1.5)
METHEMOGLOBIN: 0.8 % (ref 0.0–1.5)
O2 SAT: 79.6 %
Total hemoglobin: 9.1 g/dL — ABNORMAL LOW (ref 12.0–16.0)

## 2015-07-10 LAB — BLOOD GAS, ARTERIAL
ACID-BASE EXCESS: 4.2 mmol/L — AB (ref 0.0–2.0)
BICARBONATE: 28.6 meq/L — AB (ref 20.0–24.0)
Drawn by: 398991
FIO2: 0.6
MECHVT: 500 mL
O2 Saturation: 99.6 %
PATIENT TEMPERATURE: 99.1
PEEP/CPAP: 10 cmH2O
PH ART: 7.404 (ref 7.350–7.450)
PO2 ART: 190 mmHg — AB (ref 80.0–100.0)
RATE: 12 resp/min
TCO2: 30 mmol/L (ref 0–100)
pCO2 arterial: 46.8 mmHg — ABNORMAL HIGH (ref 35.0–45.0)

## 2015-07-10 LAB — CBC
HCT: 22.5 % — ABNORMAL LOW (ref 36.0–46.0)
Hemoglobin: 7 g/dL — ABNORMAL LOW (ref 12.0–15.0)
MCH: 30 pg (ref 26.0–34.0)
MCHC: 31.1 g/dL (ref 30.0–36.0)
MCV: 96.6 fL (ref 78.0–100.0)
PLATELETS: 178 10*3/uL (ref 150–400)
RBC: 2.33 MIL/uL — AB (ref 3.87–5.11)
RDW: 19.1 % — ABNORMAL HIGH (ref 11.5–15.5)
WBC: 8.9 10*3/uL (ref 4.0–10.5)

## 2015-07-10 LAB — PREPARE RBC (CROSSMATCH)

## 2015-07-10 MED ORDER — SODIUM CHLORIDE 0.9 % IV SOLN
Freq: Once | INTRAVENOUS | Status: AC
  Administered 2015-07-10: 09:00:00 via INTRAVENOUS

## 2015-07-10 NOTE — Progress Notes (Signed)
Advanced Heart Failure Rounding Note  PCP: Karna Dupes, PA-C Primary Cardiologist: Johnsie Cancel EP: Allred  Subjective:   This is a 65 year old female patient with h/o of CAD, HTN, ICM s/p MDT dual chamber ICD 2013, lymphoma s/p chemo/radiation 9702, chronic systolic CHF, status post VT ablation 2, HTN, HLD, VT, DM, CKD stage III, PMR and depression who presented to [facility] with CP and SOB  Started on empiric milrinone 07/02/15 with presentation concerning for low output.  PICC placed. On 4/11 developed respiratory distress requiring intubation and bronchoscopy. After intubation she was hypotensive and started on dopamine and continued on milrinone. Unfortunately later that evening she had V fib arrest multiple shocks. Milrinone and dopamine stopped. Started on lidocaine and amio drip.   Overnight had copious blood in ETT and desaturations. RT placed back on full support. CXR-increased edema. Creatinine trending up 2.3>2.6. Hgb trending down 8.3>7.0.     Objective:   Weight Range: 193 lb 5.5 oz (87.7 kg) Body mass index is 32.17 kg/(m^2).   Vital Signs:   Temp:  [98.1 F (36.7 C)-99.9 F (37.7 C)] 98.9 F (37.2 C) (04/17 0400) Pulse Rate:  [61-95] 69 (04/17 0600) Resp:  [10-30] 12 (04/17 0600) BP: (92-175)/(59-94) 108/60 mmHg (04/17 0600) SpO2:  [90 %-100 %] 100 % (04/17 0600) FiO2 (%):  [40 %-100 %] 80 % (04/17 0400) Weight:  [193 lb 5.5 oz (87.7 kg)] 193 lb 5.5 oz (87.7 kg) (04/17 0500) Last BM Date: 07/09/15 (FlexiSeal in place)  Weight change: Filed Weights   07/08/15 0336 07/09/15 0000 07/10/15 0500  Weight: 190 lb 7.6 oz (86.4 kg) 187 lb 2.7 oz (84.9 kg) 193 lb 5.5 oz (87.7 kg)    Intake/Output:   Intake/Output Summary (Last 24 hours) at 07/10/15 0719 Last data filed at 07/10/15 0650  Gross per 24 hour  Intake 2282.93 ml  Output   1965 ml  Net 317.93 ml     Physical Exam: CVP 10 General: Intubated. Awake. Agitated HEENT: ETT blood noted in tube.  Neck:  supple. Carotids 2+ bilat; no bruits. No thyromegaly or nodule noted. Cor: PMI nondisplaced. RRR. No rubs or gallops. MR murmur Lungs: Coarse throughout Abdomen: soft, NT, ND, no HSM. No bruits or masses. +BS  Extremities: no cyanosis, clubbing, rash, edema. No nodule or warmth Neuro: Intubated agitated. Not following commands.  GU: Foley.   Telemetry: SR 70s    Labs: CBC  Recent Labs  07/07/15 0745  07/09/15 0416 07/10/15 0440  WBC 6.3  < > 4.5 8.9  NEUTROABS 5.0  --   --   --   HGB 8.2*  < > 8.3* 7.0*  HCT 25.3*  < > 27.1* 22.5*  MCV 92.0  < > 93.1 96.6  PLT 177  < > 189 178  < > = values in this interval not displayed. Basic Metabolic Panel  Recent Labs  07/09/15 0416 07/10/15 0440  NA 143 144  K 3.7 4.2  CL 105 104  CO2 29 29  GLUCOSE 146* 135*  BUN 43* 48*  CREATININE 2.36* 2.62*  CALCIUM 8.7* 8.8*   Liver Function Tests  Recent Labs  07/08/15 0425  AST 92*  ALT 174*  ALKPHOS 62  BILITOT 0.8  PROT 5.6*  ALBUMIN 1.9*   No results for input(s): LIPASE, AMYLASE in the last 72 hours. Cardiac Enzymes No results for input(s): CKTOTAL, CKMB, CKMBINDEX, TROPONINI in the last 72 hours.  BNP: BNP (last 3 results)  Recent Labs  02/21/15 1042 03/01/15  2130 04/13/15 2215  BNP 386.1* 452.0* 894.0*    ProBNP (last 3 results) No results for input(s): PROBNP in the last 8760 hours.   D-Dimer No results for input(s): DDIMER in the last 72 hours. Hemoglobin A1C No results for input(s): HGBA1C in the last 72 hours. Fasting Lipid Panel No results for input(s): CHOL, HDL, LDLCALC, TRIG, CHOLHDL, LDLDIRECT in the last 72 hours. Thyroid Function Tests No results for input(s): TSH, T4TOTAL, T3FREE, THYROIDAB in the last 72 hours.  Invalid input(s): FREET3  Other results:     Imaging/Studies:  Dg Chest Port 1 View  07/09/2015  CLINICAL DATA:  65 year old female with history of acute respiratory failure. EXAM: PORTABLE CHEST 1 VIEW COMPARISON:   Chest x-ray 07/08/2015. FINDINGS: An endotracheal tube is in place with tip 3.8 cm above the carina. A nasogastric tube is seen extending into the stomach, however, the tip of the nasogastric tube extends below the lower margin of the image. There is a right upper extremity PICC with tip terminating in the superior cavoatrial junction. Left-sided pacemaker/AICD with lead tips projecting over the expected location of the right atrium and right ventricular apex. Lung volumes are low. There are bibasilar opacities which are favored to predominantly reflect subsegmental atelectasis. There continues to be some mild interstitial prominence. No acute consolidative airspace disease. No pleural effusions. Previously noted cephalization of the pulmonary vasculature appears to be decreasing. Mild cardiomegaly. Upper mediastinal contours are within normal limits. Atherosclerosis in the thoracic aorta. IMPRESSION: 1. Support apparatus, as above. 2. Low lung volumes with what appears to be resolving pulmonary edema. 3. Mild cardiomegaly. 4. Atherosclerosis. Electronically Signed   By: Vinnie Langton M.D.   On: 07/09/2015 07:54    Latest Echo  Latest Cath   Medications:     Scheduled Medications: . allopurinol  100 mg Per Tube Daily  . antiseptic oral rinse  7 mL Mouth Rinse 10 times per day  . aspirin  81 mg Per Tube Daily  . chlorhexidine gluconate (SAGE KIT)  15 mL Mouth Rinse BID  . colchicine  0.3 mg Oral Daily  . famotidine  20 mg Per Tube Daily  . feeding supplement (PRO-STAT SUGAR FREE 64)  30 mL Per Tube BID  . feeding supplement (VITAL HIGH PROTEIN)  1,000 mL Per Tube Q24H  . ferrous sulfate  300 mg Per Tube BID WC  . furosemide  80 mg Intravenous BID  . insulin aspart  0-9 Units Subcutaneous 6 times per day  . levothyroxine  100 mcg Per Tube QAC breakfast  . PARoxetine  10 mg Oral Daily  . piperacillin-tazobactam (ZOSYN)  IV  3.375 g Intravenous 3 times per day  . potassium chloride  40 mEq  Oral BID  . QUEtiapine  25 mg Oral QHS  . sodium chloride flush  10-40 mL Intracatheter Q12H    Infusions: . sodium chloride Stopped (07/09/15 1329)  . amiodarone 30 mg/hr (07/09/15 2049)  . fentaNYL infusion INTRAVENOUS 250 mcg/hr (07/10/15 0650)    PRN Medications: [DISCONTINUED] acetaminophen **OR** acetaminophen, acetaminophen, albuterol, diphenhydrAMINE **OR** diphenhydrAMINE, fentaNYL, midazolam, nitroGLYCERIN, ondansetron (ZOFRAN) IV, ondansetron **OR** [DISCONTINUED] ondansetron (ZOFRAN) IV, promethazine, sodium chloride flush   Assessment   1. Respiratory Arrest 2. VF Arrest 3.Acute on chronic combined CHF - EF 20-25% w/ diffuse HK and akinesis of the inferolateral and inferior myocardium, G2DD, severe MR, mild RV dysfunction/dilation, + atrial septal anuerysm 4. Severe MR - by TEE 5.  Severe Pulm HTN - PA pressure 90 mm Hg  6.  PAF 7. AKI on CKD stage IV  8. Chronic anemia - workup in 03/2014 unremarkable GI bleed. 9. DM2 10. HLD 11. Hypokalemia 12. LCB-  No external shocks. [place] for meds and drugs.  13. Hemoptysis/pulmonary hemorrhage   Plan    Remains intubated. CVP down to 10-11. CXR worse today. Had hemoptysis over night. Hgb down to 7.0 . Will need 1UPRBCs.. Off all anticoagulants since 4/13.   Co-ox 80%. No further VT. Continue amio.   Renal function improving. K 4.2. INR 2.95   Will continue IV diuresis.  Supp K.   Continue vent wean per CCM. Placed back on full support last night.     Length of Stay: [place] NP-C  07/10/2015, 7:19 AM  Advanced Heart Failure Team Pager [phone] (M-F; 7a - 4p)  Please contact [facility] for night-coverage after hours (4p -7a ) and weekends on amion  Patient seen and examined with Darrick Grinder, NP. We discussed all aspects of the encounter. I agree with the assessment and plan as stated above.   She had massive hemoptysis with pulmonary hemorrhage yesterday. Received FFP. Now on full vent support. She is  anemic. Will transfuse. VT quiescent. CVP 10-11. Continue diuresis.   Situation growing increasingly concerning. If respiratory status doesn't improve may need to discuss with trach with daughter. Await CCM input.   The patient is critically ill with multiple organ systems failure and requires high complexity decision making for assessment and support, frequent evaluation and titration of therapies, application of advanced monitoring technologies and extensive interpretation of multiple databases.   Critical Care Time devoted to patient care services described in this note is 35 Minutes.  Bensimhon, Daniel,MD 8:02 AM

## 2015-07-10 NOTE — Progress Notes (Signed)
PULMONARY / CRITICAL CARE MEDICINE   Name: [name] Terry MRN: [id number] DOB: [birth year]    ADMISSION DATE:  07/15/2015 CONSULTATION DATE:  07/04/15  REFERRING MD:  Fanny Bien  CHIEF COMPLAINT:  Respiratory arrest  HISTORY OF PRESENT ILLNESS:    [name] Terry is 65 yo female with past medical history significant for ischemic cardiomyopathy, stroke, coronary artery disease, lymphoma, CHF  S/p ICD placement ,HTN,HLD, Venticular tachycardia,chronic kidney disease -stage 3, depression, GERD.  Patient presented to ED with chest pain and LHC was not indicative of PCI at the time and was medically managed.  Started on empiric milrinone 07/02/15 with presentation concerning for low output On 4/11 developed  respiratory arrest.  Patient never lost her pulse and was intubated , now on vent. Developed VF arrest 4/11 evening requiring multiple shocks  SUBJECTIVE:  Hemorrhage from ET overnight, given FFP Oxygenation worse Still receiving lasix  VITAL SIGNS: BP 92/57 mmHg  Pulse 64  Temp(Src) 98.9 F (37.2 C) (Oral)  Resp 10  Ht 5\' 5"  (1.651 m)  Wt 193 lb 5.5 oz (87.7 kg)  BMI 32.17 kg/m2  SpO2 100%  HEMODYNAMICS: CVP:  [4 mmHg-9 mmHg] 4 mmHg  VENTILATOR SETTINGS: Vent Mode:  [-] PRVC FiO2 (%):  [40 %-100 %] 80 % Set Rate:  [12 bmp] 12 bmp Vt Set:  [500 mL] 500 mL PEEP:  [5 cmH20-10 cmH20] 10 cmH20 Pressure Support:  [15 cmH20] 15 cmH20 Plateau Pressure:  [18 cmH20-31 cmH20] 28 cmH20  INTAKE / OUTPUT: I/O last 3 completed shifts: In: 3563.3 [I.V.:1313.3; Blood:650; Other:80; NG/GT:1270; IV Piggyback:250] Out: [id number] [Urine:2040; Emesis/NG output:700]  PHYSICAL EXAMINATION:  Gen: sedated on vent, arouses to voice, doesn't follow commands HENT: NCAT ETT in place PULM: Rhonchi bilaterally CV: RRR on my exam, gallop noted, systolic murmur GI: BS+, soft, nontender MSK: normal bulk and tone Neuro: sedated on vent, arouses to voice, doesn't follow commands  LABS:  BMET  Recent  Labs Lab 07/08/15 0425 07/09/15 0416 07/10/15 0440  NA 138 143 144  K 3.7 3.7 4.2  CL 101 105 104  CO2 26 29 29   BUN 40* 43* 48*  CREATININE 2.44* 2.36* 2.62*  GLUCOSE 172* 146* 135*    Electrolytes  Recent Labs Lab 07/04/15 1410  07/06/15 0400 07/07/15 0255 07/08/15 0425 07/09/15 0416 07/10/15 0440  CALCIUM 8.2*  < > 8.3* 8.3* 8.4* 8.7* 8.8*  MG 2.4  --  1.9 2.2  --   --   --   PHOS  --   --  4.0 4.3  --   --   --   < > = values in this interval not displayed.  CBC  Recent Labs Lab 07/08/15 0425 07/09/15 0416 07/10/15 0440  WBC 6.7 4.5 8.9  HGB 8.4* 8.3* 7.0*  HCT 26.8* 27.1* 22.5*  PLT 190 189 178    Coag's  Recent Labs Lab 07/09/15 0416 07/10/15 0030 07/10/15 0440  INR 5.68* 3.07* 2.95*    Sepsis Markers No results for input(s): LATICACIDVEN, PROCALCITON, O2SATVEN in the last 168 hours.  ABG  Recent Labs Lab 07/05/15 1027 07/06/15 0527 07/07/15 0423  PHART 7.535* 7.369 7.387  PCO2ART 27.8* 45.0 44.0  PO2ART 93.0 71.0* 129*    Liver Enzymes  Recent Labs Lab 07/04/15 1410 07/05/15 0530 07/08/15 0425  AST 96* 620* 92*  ALT 55* 389* 174*  ALKPHOS 67 66 62  BILITOT 0.8 1.0 0.8  ALBUMIN 2.5* 1.9* 1.9*    Cardiac Enzymes  Recent Labs Lab 07/04/15  1545 07/04/15 2043 07/05/15 0400  TROPONINI 0.08* 0.49* 1.18*    Glucose  Recent Labs Lab 07/09/15 0800 07/09/15 1135 07/09/15 1604 07/09/15 2022 07/10/15 0033 07/10/15 0442  GLUCAP 108* 153* 185* 158* 117* 125*    Imaging Dg Chest Port 1 View  07/10/2015  CLINICAL DATA:  Acute respiratory failure, cardiomyopathy and coronary artery and valvular heart disease EXAM: PORTABLE CHEST 1 VIEW COMPARISON:  Portable chest x-ray of July 09, 2015 FINDINGS: The lungs are slightly better inflated today. There are increased interstitial opacities bilaterally however. There is pleural fluid on the left blunting the lateral costophrenic angle. The cardiac silhouette remains enlarged. The  pulmonary vascularity remains engorged. The permanent pacemaker/defibrillator is in stable position. The endotracheal tube tip lies approximately 3.8 cm above the carina. The esophagogastric tube tip projects below the inferior margin of the image. IMPRESSION: CHF with pulmonary interstitial edema and small left pleural effusion. There has been mild interval deterioration in the appearance of the pulmonary interstitium since the previous study. The support apparatus are in stable position. Electronically Signed   By: David  Martinique M.D.   On: 07/10/2015 07:20     STUDIES:  4/6 TEE 4/7 RHC And Coronary angiography Distal LAD , 30% stenosed, proximal Circumflex lesion 405 stenosed, proximal circumflex to mid circumflex 50% stenosed,1st Mrg lesion, 75% stenosed,1st RPLB lesion, 99% stenosed. 4/11 Bronchoscopy per Dr. Nelda Marseille "right mainstem hemorrhage"  CULTURES: 4/11 BC >> 4/11 sputum>> nml flora 4/11 urine>> e coli  4/17 resp >   ANTIBIOTICS:  4/11 zosyn>>  SIGNIFICANT EVENTS:  LINES/TUBES: PICC 4/8>>> 4/11 ET>>>  DISCUSSION: 65 YO female with past medical Hx of  ischemic cardiomyopathy, stroke, coronary artery disease, lymphoma, CHF  S/p ICD placement ,HTN,HLD, Venticular tachycardia,chronic kidney disease -stage 3, depression, GERD, NSTEMI, had repiratory arrested,intubated and currently on vent.  Remains in renal failure and had hemoptysis overnight on 4/17 in setting of worsening hemoptysis.  Was noted to have bleeding on bronchoscopy on 4/11, recurrent in setting of elevated INR 4/17.  ASSESSMENT / PLAN:  PULMONARY A: Acute Hypoxemic respiratory failure Bit tongue Pulmonary edema ?Aspiration Hemoptysis 4/16 in setting of elevated INR P:   FFP overnight May need repeat bronch, will discuss findings from prior bronchoscopy with Dr. Nelda Marseille Continue diuresis Wean PEEP/FiO2 today ABG now Duoneb/albuterol   CARDIOVASCULAR A:  NSTEMI Chronic systolic CHF-EF 123456, severe  MR Hx of CAD, S/P stenting PAF P:  Continue amiodarone per cardiology Tele  RENAL A:   Chronic kidney disease -stage 3 Increased Anion gap acidosis Hyponatremia -resolved  P:   Monitor BMET and UOP Replace electrolytes as needed Lasix 80 mg IV BID per cardiology  GASTROINTESTINAL A:  Diarrhea C-DIFF- Antigen+, toxin -negative -repeat same GERD P:  TF per nutrition > restart trickle feedings today Protonix for GIP  HEMATOLOGIC A:   Coagulopathy > due to warfarin P:  SCD's Hold oral coumadin Transfuse PRBC today Repeat CBC later today  INFECTIOUS A:   Aspiration PNA E coli UTI C diff colonizer P:   Hold zosyn Repeat resp culture given worsening CXR findings  ENDOCRINE A:   Diabetese Hypothyroidism P:   SSI coverage Levothyroxine -via tube  NEUROLOGIC A:   Hx OF Depression/ anxiety P:   RASS goal -1 Hold paxil/ ct seroquel Continue fentanyl gtt per PAD protocol  FAMILY  - Updates: No family bedside  - Inter-disciplinary family meet or Palliative Care meeting due by: 07/10/15 > discuss with cardiology today   The patient is critically  ill with multiple organ systems failure and requires high complexity decision making for assessment and support, frequent evaluation and titration of therapies, application of advanced monitoring technologies and extensive interpretation of multiple databases. Critical Care Time devoted to patient care services described in this note independent of APP time is 37 minutes.   Roselie Awkward, MD [place] [facility] Pager: [phone] Cell: [phone] After 3pm or if no response, call [phone]    07/10/2015

## 2015-07-10 NOTE — Progress Notes (Signed)
Sputum specimen obtained and sent down to main lab without complications.  

## 2015-07-11 ENCOUNTER — Encounter (HOSPITAL_COMMUNITY): Payer: Self-pay | Admitting: *Deleted

## 2015-07-11 ENCOUNTER — Inpatient Hospital Stay (HOSPITAL_COMMUNITY): Payer: Medicare Other

## 2015-07-11 ENCOUNTER — Encounter (HOSPITAL_COMMUNITY): Admission: EM | Disposition: E | Payer: Self-pay | Source: Home / Self Care | Attending: Pulmonary Disease

## 2015-07-11 DIAGNOSIS — K921 Melena: Secondary | ICD-10-CM

## 2015-07-11 DIAGNOSIS — K922 Gastrointestinal hemorrhage, unspecified: Secondary | ICD-10-CM

## 2015-07-11 HISTORY — PX: ESOPHAGOGASTRODUODENOSCOPY: SHX5428

## 2015-07-11 LAB — PREPARE FRESH FROZEN PLASMA
UNIT DIVISION: 0
UNIT DIVISION: 0
Unit division: 0

## 2015-07-11 LAB — BASIC METABOLIC PANEL
ANION GAP: 12 (ref 5–15)
BUN: 60 mg/dL — ABNORMAL HIGH (ref 6–20)
CALCIUM: 9.2 mg/dL (ref 8.9–10.3)
CO2: 29 mmol/L (ref 22–32)
Chloride: 107 mmol/L (ref 101–111)
Creatinine, Ser: 3 mg/dL — ABNORMAL HIGH (ref 0.44–1.00)
GFR calc Af Amer: 18 mL/min — ABNORMAL LOW (ref >60)
GFR calc non Af Amer: 16 mL/min — ABNORMAL LOW (ref >60)
GLUCOSE: 162 mg/dL — AB (ref 65–99)
Potassium: 4.2 mmol/L (ref 3.5–5.1)
SODIUM: 148 mmol/L — AB (ref 135–145)

## 2015-07-11 LAB — GLUCOSE, CAPILLARY
GLUCOSE-CAPILLARY: 148 mg/dL — AB (ref 65–99)
Glucose-Capillary: 142 mg/dL — ABNORMAL HIGH (ref 65–99)
Glucose-Capillary: 132 mg/dL — ABNORMAL HIGH (ref 65–99)
Glucose-Capillary: 144 mg/dL — ABNORMAL HIGH (ref 65–99)

## 2015-07-11 LAB — CBC
HEMATOCRIT: 16.2 % — AB (ref 36.0–46.0)
HEMOGLOBIN: 5.2 g/dL — AB (ref 12.0–15.0)
MCH: 29.2 pg (ref 26.0–34.0)
MCHC: 32.1 g/dL (ref 30.0–36.0)
MCV: 91 fL (ref 78.0–100.0)
Platelets: 118 10*3/uL — ABNORMAL LOW (ref 150–400)
RBC: 1.78 MIL/uL — ABNORMAL LOW (ref 3.87–5.11)
RDW: 18.3 % — AB (ref 11.5–15.5)
WBC: 6.8 10*3/uL (ref 4.0–10.5)

## 2015-07-11 LAB — CBC WITH DIFFERENTIAL/PLATELET
Basophils Absolute: 0 10*3/uL (ref 0.0–0.1)
Basophils Relative: 0 %
Eosinophils Absolute: 0.1 10*3/uL (ref 0.0–0.7)
Eosinophils Relative: 1 %
HEMATOCRIT: 23.5 % — AB (ref 36.0–46.0)
HEMOGLOBIN: 7.3 g/dL — AB (ref 12.0–15.0)
LYMPHS ABS: 2.3 10*3/uL (ref 0.7–4.0)
LYMPHS PCT: 24 %
MCH: 29.3 pg (ref 26.0–34.0)
MCHC: 31.1 g/dL (ref 30.0–36.0)
MCV: 94.4 fL (ref 78.0–100.0)
MONO ABS: 0.7 10*3/uL (ref 0.1–1.0)
MONOS PCT: 7 %
NEUTROS ABS: 6.5 10*3/uL (ref 1.7–7.7)
Neutrophils Relative %: 68 %
Platelets: 204 10*3/uL (ref 150–400)
RBC: 2.49 MIL/uL — ABNORMAL LOW (ref 3.87–5.11)
RDW: 19 % — AB (ref 11.5–15.5)
WBC: 9.6 10*3/uL (ref 4.0–10.5)

## 2015-07-11 LAB — PREPARE RBC (CROSSMATCH)

## 2015-07-11 LAB — APTT: aPTT: 39 seconds — ABNORMAL HIGH (ref 24–37)

## 2015-07-11 LAB — PROTIME-INR
INR: 3.2 — AB (ref 0.00–1.49)
INR: 1.76 — AB (ref 0.00–1.49)
PROTHROMBIN TIME: 20.5 s — AB (ref 11.6–15.2)
Prothrombin Time: 32.1 seconds — ABNORMAL HIGH (ref 11.6–15.2)

## 2015-07-11 LAB — CARBOXYHEMOGLOBIN
CARBOXYHEMOGLOBIN: 2.2 % — AB (ref 0.5–1.5)
METHEMOGLOBIN: 0.8 % (ref 0.0–1.5)
O2 Saturation: 74.7 %
Total hemoglobin: 7.6 g/dL — ABNORMAL LOW (ref 12.0–16.0)

## 2015-07-11 SURGERY — EGD (ESOPHAGOGASTRODUODENOSCOPY)
Anesthesia: Moderate Sedation

## 2015-07-11 MED ORDER — MIDAZOLAM HCL 5 MG/ML IJ SOLN
INTRAMUSCULAR | Status: AC
  Filled 2015-07-11: qty 2

## 2015-07-11 MED ORDER — SODIUM CHLORIDE 0.9 % IV SOLN: Freq: Once | INTRAVENOUS | Status: DC

## 2015-07-11 MED ORDER — MIDAZOLAM HCL 10 MG/2ML IJ SOLN
INTRAMUSCULAR | Status: DC | PRN
  Administered 2015-07-11 (×2): 1 mg via INTRAVENOUS
  Administered 2015-07-11: 2 mg via INTRAVENOUS
  Administered 2015-07-11: 1 mg via INTRAVENOUS

## 2015-07-11 MED ORDER — DEXTROSE 5 % IV SOLN
5.0000 mg | Freq: Once | INTRAVENOUS | Status: AC
  Administered 2015-07-11: 5 mg via INTRAVENOUS
  Filled 2015-07-11: qty 0.5

## 2015-07-11 MED ORDER — SODIUM CHLORIDE 0.9 % IV SOLN
INTRAVENOUS | Status: DC
Start: 2015-07-11 — End: 2015-07-11

## 2015-07-11 MED ORDER — SODIUM CHLORIDE 0.9 % IJ SOLN
INTRAMUSCULAR | Status: DC | PRN
  Administered 2015-07-11: 6 mL

## 2015-07-11 MED ORDER — PANTOPRAZOLE SODIUM 40 MG IV SOLR: 40.0000 mg | Freq: Two times a day (BID) | INTRAVENOUS | Status: DC

## 2015-07-11 MED ORDER — NOREPINEPHRINE BITARTRATE 1 MG/ML IV SOLN
0.0000 ug/min | INTRAVENOUS | Status: DC
  Filled 2015-07-11: qty 4

## 2015-07-11 MED ORDER — SODIUM CHLORIDE 0.9 % IV SOLN
8.0000 mg/h | INTRAVENOUS | Status: AC
  Administered 2015-07-11 – 2015-07-14 (×6): 8 mg/h via INTRAVENOUS
  Filled 2015-07-11 (×13): qty 80

## 2015-07-11 MED ORDER — SODIUM CHLORIDE 0.9 % IV SOLN
Freq: Once | INTRAVENOUS | Status: AC
  Administered 2015-07-11: 11:00:00 via INTRAVENOUS

## 2015-07-11 MED ORDER — SODIUM CHLORIDE 0.9 % IV SOLN
80.0000 mg | Freq: Once | INTRAVENOUS | Status: AC
Start: 2015-07-11 — End: 2015-07-11
  Administered 2015-07-11: 80 mg via INTRAVENOUS
  Filled 2015-07-11: qty 80

## 2015-07-11 MED ORDER — FENTANYL CITRATE (PF) 100 MCG/2ML IJ SOLN
INTRAMUSCULAR | Status: AC
  Filled 2015-07-11: qty 2

## 2015-07-11 MED ORDER — EPINEPHRINE HCL 0.1 MG/ML IJ SOSY
PREFILLED_SYRINGE | INTRAMUSCULAR | Status: AC
  Filled 2015-07-11: qty 10

## 2015-07-11 MED ORDER — SODIUM CHLORIDE 0.9 % IV SOLN
Freq: Once | INTRAVENOUS | Status: AC
  Administered 2015-07-11: 12:00:00 via INTRAVENOUS

## 2015-07-11 NOTE — Op Note (Signed)
PCCM Video Bronchoscopy Procedure Note  The patient was informed of the risks (including but not limited to bleeding, infection, respiratory failure, lung injury, tooth/oral injury) and benefits of the procedure and gave consent, see chart.  Indication: hemoptysis  Post Procedure Diagnosis: old blood in right mainstem, no airway lesions  Location: [facility]  Condition pre procedure: Critically ill on vent  Medications for procedure: fentanyl gtt, versed bolus 2mg  IV  Procedure description: The bronchoscope was introduced through the endotracheal tube and passed to the bilateral lungs to the level of the subsegmental bronchi throughout the tracheobronchial tree.  Airway exam revealed normal trachea, clear mucus production all over, some tracheomalacia, no airway lesion.  There was some old blood in the right lower lobe.  Procedures performed: none  Specimens sent: none  Condition post procedure: critically ill on vent  EBL: none  Complications: none  Roselie Awkward, MD [place] [facility] Pager: [phone] Cell: [phone] After 3pm or if no response, call [phone]

## 2015-07-11 NOTE — Progress Notes (Signed)
CRITICAL VALUE ALERT  Critical value received:  Hgb 5.2  Date of notification:  06/25/2015  Time of notification:  1900  Critical value read back:Yes.    Nurse who received alert:  Lesli Albee  MD notified (1st page):  Quentin Ore  Time of first page:  [phone]

## 2015-07-11 NOTE — Interval H&P Note (Signed)
History and Physical Interval Note:  07/02/2015 9:48 PM  [name] Terry  has presented today for surgery, with the diagnosis of GI bleed  The various methods of treatment have been discussed with the patient and family. After consideration of risks, benefits and other options for treatment, the patient has consented to  Procedure(s): ESOPHAGOGASTRODUODENOSCOPY (EGD) (N/A) as a surgical intervention .  The patient's history has been reviewed, patient examined, no change in status, stable for surgery.  I have reviewed the patient's chart and labs.  Questions were answered to the patient's satisfaction.     [place] C.

## 2015-07-11 NOTE — Progress Notes (Signed)
Paged Dr. Vaughan Browner regarding patient's hemoglobin result 7.3. Will continue to monitor.

## 2015-07-11 NOTE — Progress Notes (Addendum)
eLink Physician-Brief Progress Note Patient Name: [name] Terry DOB: 1950/11/26 MRN: [id number]   Date of Service  07/04/2015  HPI/Events of Note  Hgb = 5.2   eICU Interventions  Will transfuse 3 units PRBC now. Will have GI see the patient in consultation. Dr. Michail Sermon with [facility] notified personally.     Intervention Category Intermediate Interventions: Bleeding - evaluation and treatment with blood products  Sommer,Steven Cornelia Copa 06/26/2015, 7:12 PM

## 2015-07-11 NOTE — Progress Notes (Signed)
Advanced Heart Failure Rounding Note  PCP: Karna Dupes, PA-C Primary Cardiologist: Johnsie Cancel EP: Allred  Subjective:   This is a 65 year old female patient with h/o of CAD, HTN, ICM s/p MDT dual chamber ICD 2013, lymphoma s/p chemo/radiation 2458, chronic systolic CHF, status post VT ablation 2, HTN, HLD, VT, DM, CKD stage III, PMR and depression who presented to [facility] with CP and SOB  Started on empiric milrinone 07/02/15 with presentation concerning for low output.  PICC placed. On 4/11 developed respiratory distress requiring intubation and bronchoscopy. After intubation she was hypotensive and started on dopamine and continued on milrinone. Unfortunately later that evening she had V fib arrest multiple shocks. Milrinone and dopamine stopped. Started on lidocaine and amio drip.   Over weekend had pulmonary hemorrhage. Hgb was 7.0. Given FFP, vit k, and 1UPRBCs. Earlier today had large bout of  melena. Hgb 7.3 Receiving FFP and vitamin K. SBP in 70s. Creatinine trending up 2.6>3.0     Objective:   Weight Range: 189 lb 2.5 oz (85.8 kg) Body mass index is 31.48 kg/(m^2).   Vital Signs:   Temp:  [99 F (37.2 C)-99.1 F (37.3 C)] 99 F (37.2 C) (04/18 0754) Pulse Rate:  [66-131] 112 (04/18 1000) Resp:  [0-19] 12 (04/18 1000) BP: (74-176)/(53-92) 74/53 mmHg (04/18 1000) SpO2:  [100 %] 100 % (04/18 1000) FiO2 (%):  [40 %-60 %] 40 % (04/18 0800) Weight:  [189 lb 2.5 oz (85.8 kg)] 189 lb 2.5 oz (85.8 kg) (04/18 0342) Last BM Date: 06/25/2015  Weight change: Filed Weights   07/09/15 0000 07/10/15 0500 06/25/2015 0342  Weight: 187 lb 2.7 oz (84.9 kg) 193 lb 5.5 oz (87.7 kg) 189 lb 2.5 oz (85.8 kg)    Intake/Output:   Intake/Output Summary (Last 24 hours) at 06/29/2015 1020 Last data filed at 07/18/2015 1000  Gross per 24 hour  Intake 1674.93 ml  Output   1200 ml  Net 474.93 ml     Physical Exam: CVP 6  General: Intubated. Awake. Agitated HEENT: ETT blood noted in tube.    Neck: supple. Carotids 2+ bilat; no bruits. No thyromegaly or nodule noted. Cor: PMI nondisplaced. RRR. No rubs or gallops. MR murmur Lungs: Coarse throughout Abdomen: soft, NT, ND, no HSM. No bruits or masses. +BS  Extremities: no cyanosis, clubbing, rash, edema. No nodule or warmth Neuro: Intubated agitated. Not following commands.  GU: Foley.   Telemetry: Sinus Tach 110s   Labs: CBC  Recent Labs  07/10/15 0440 07/04/2015 0400  WBC 8.9 9.6  NEUTROABS  --  6.5  HGB 7.0* 7.3*  HCT 22.5* 23.5*  MCV 96.6 94.4  PLT 178 099   Basic Metabolic Panel  Recent Labs  07/10/15 0440 07/17/2015 0400  NA 144 148*  K 4.2 4.2  CL 104 107  CO2 29 29  GLUCOSE 135* 162*  BUN 48* 60*  CREATININE 2.62* 3.00*  CALCIUM 8.8* 9.2   Liver Function Tests No results for input(s): AST, ALT, ALKPHOS, BILITOT, PROT, ALBUMIN in the last 72 hours. No results for input(s): LIPASE, AMYLASE in the last 72 hours. Cardiac Enzymes No results for input(s): CKTOTAL, CKMB, CKMBINDEX, TROPONINI in the last 72 hours.  BNP: BNP (last 3 results)  Recent Labs  02/21/15 1042 03/01/15 0839 04/13/15 2215  BNP 386.1* 452.0* 894.0*    ProBNP (last 3 results) No results for input(s): PROBNP in the last 8760 hours.   D-Dimer No results for input(s): DDIMER in the last 72 hours.  Hemoglobin A1C No results for input(s): HGBA1C in the last 72 hours. Fasting Lipid Panel No results for input(s): CHOL, HDL, LDLCALC, TRIG, CHOLHDL, LDLDIRECT in the last 72 hours. Thyroid Function Tests No results for input(s): TSH, T4TOTAL, T3FREE, THYROIDAB in the last 72 hours.  Invalid input(s): FREET3  Other results:     Imaging/Studies:  Dg Chest Port 1 View  07/09/2015  CLINICAL DATA:  Acute respiratory failure, ischemic cardiomyopathy, diabetes EXAM: PORTABLE CHEST 1 VIEW COMPARISON:  Portable chest x-ray of July 10, 2015. FINDINGS: The lungs are borderline hypoinflated. The interstitial markings have  improved slightly. There remain confluent alveolar opacities in both lungs. The left hemidiaphragm it is partially obscured. The heart remains enlarged. The permanent pacemaker defibrillator is in stable position. There is very high positioning of the endotracheal tube approximately 3.1 cm above the superior margin of the clavicular heads which is almost 10 cm above the carina. The PICC line tip projects over the midportion of the SVC. The esophagogastric tube tip projects below the inferior margin of the image. IMPRESSION: 1. High positioning of the endotracheal tube. Advancement by at least 5 cm is needed. Critical Value/emergent results were called by telephone at the time of interpretation on 07/23/2015 at 7:14 am to Lesli Albee, RN, who verbally acknowledged these results. 2. Mild improvement in alveolar opacities bilaterally. Stable cardiomegaly with mild pulmonary vascular congestion. Electronically Signed   By: David  Martinique M.D.   On: 07/04/2015 07:16   Dg Chest Port 1 View  07/10/2015  CLINICAL DATA:  Acute respiratory failure, cardiomyopathy and coronary artery and valvular heart disease EXAM: PORTABLE CHEST 1 VIEW COMPARISON:  Portable chest x-ray of July 09, 2015 FINDINGS: The lungs are slightly better inflated today. There are increased interstitial opacities bilaterally however. There is pleural fluid on the left blunting the lateral costophrenic angle. The cardiac silhouette remains enlarged. The pulmonary vascularity remains engorged. The permanent pacemaker/defibrillator is in stable position. The endotracheal tube tip lies approximately 3.8 cm above the carina. The esophagogastric tube tip projects below the inferior margin of the image. IMPRESSION: CHF with pulmonary interstitial edema and small left pleural effusion. There has been mild interval deterioration in the appearance of the pulmonary interstitium since the previous study. The support apparatus are in stable position.  Electronically Signed   By: David  Martinique M.D.   On: 07/10/2015 07:20    Latest Echo  Latest Cath   Medications:     Scheduled Medications: . sodium chloride   Intravenous Once  . allopurinol  100 mg Per Tube Daily  . antiseptic oral rinse  7 mL Mouth Rinse 10 times per day  . aspirin  81 mg Per Tube Daily  . chlorhexidine gluconate (SAGE KIT)  15 mL Mouth Rinse BID  . colchicine  0.3 mg Oral Daily  . famotidine  20 mg Per Tube Daily  . feeding supplement (PRO-STAT SUGAR FREE 64)  30 mL Per Tube BID  . feeding supplement (VITAL HIGH PROTEIN)  1,000 mL Per Tube Q24H  . ferrous sulfate  300 mg Per Tube BID WC  . furosemide  80 mg Intravenous BID  . insulin aspart  0-9 Units Subcutaneous 6 times per day  . levothyroxine  100 mcg Per Tube QAC breakfast  . PARoxetine  10 mg Oral Daily  . phytonadione (VITAMIN K) IV  5 mg Intravenous Once  . potassium chloride  40 mEq Oral BID  . QUEtiapine  25 mg Oral QHS  . sodium chloride flush  10-40 mL Intracatheter Q12H    Infusions: . sodium chloride Stopped (07/09/15 1329)  . amiodarone 30 mg/hr (07/07/2015 0904)  . fentaNYL infusion INTRAVENOUS 300 mcg/hr (06/28/2015 1000)    PRN Medications: [DISCONTINUED] acetaminophen **OR** acetaminophen, acetaminophen, albuterol, diphenhydrAMINE **OR** diphenhydrAMINE, fentaNYL, midazolam, nitroGLYCERIN, ondansetron (ZOFRAN) IV, ondansetron **OR** [DISCONTINUED] ondansetron (ZOFRAN) IV, promethazine, sodium chloride flush   Assessment   1. Respiratory Arrest 2. VF Arrest 3.Acute on chronic combined CHF - EF 20-25% w/ diffuse HK and akinesis of the inferolateral and inferior myocardium, G2DD, severe MR, mild RV dysfunction/dilation, + atrial septal anuerysm 4. Severe MR - by TEE 5.  Severe Pulm HTN - PA pressure 90 mm Hg 6.  PAF 7. AKI on CKD stage IV  8. Chronic anemia - workup in 03/2014 unremarkable GI bleed. 9. DM2 10. HLD 11. Hypokalemia 12. LCB-  No external shocks. [place] for meds and  drugs.  13. Hemoptysis/pulmonary hemorrhage 14.  Melena- Hemorrhagic shock.      Plan    Remains intubated. Yesterday had pulmonary hemorrhage. Bronchoscopy earlier today. Hypotensive post procedure.   SBP in 70s. Earlier today had melena. Hgb 7.3. Received FFP and vitamin K. Hold diuretics for now. SBP post bronch has remained low. Add norepi. Give 2 UPRBCs.now.     Respiratory Culture- yeast. Discussed ID pharmacy. No role antifungal likely colonization.   Co-ox 75% . No further VT. Continue amio.   Renal function getting worse. 2.6>3.0 . Hold diuretics for now.    INR 3.2  Receiving vitamin K.    Length of Stay: [place] NP-C  07/08/2015, 10:20 AM  Advanced Heart Failure Team Pager [phone] (M-F; 7a - 4p)  Please contact [facility] for night-coverage after hours (4p -7a ) and weekends on amion  Patient seen and examined with Darrick Grinder, NP. We discussed all aspects of the encounter. I agree with the assessment and plan as stated above.   Continues with multi-system organ failure with pulmonary hemorrhage, GI bleeding, worsening renal function. Will hold lasix. Transfuse aggressively.   VT is quiescent. Continue amio. CVP ok. Holding diuretics for now.   Discussed with CCM. Will continue to work toward extubation as possible. Prognosis remains tenuous.   The patient is critically ill with multiple organ systems failure and requires high complexity decision making for assessment and support, frequent evaluation and titration of therapies, application of advanced monitoring technologies and extensive interpretation of multiple databases.   Critical Care Time devoted to patient care services described in this note is 35 Minutes.  Jashayla Glatfelter,MD 1:22 PM

## 2015-07-11 NOTE — Op Note (Signed)
[facility] Patient Name: [name] Terry Procedure Date : 06/26/2015 MRN: [id number] Attending MD: Lear Ng , MD Date of Birth: 29-Jan-1951 CSN: [id number] Age: 65 Admit Type: Inpatient Procedure:                Upper GI endoscopy Indications:              Hematemesis, Melena Providers:                Lear Ng, MD, Tory Emerald, RN, Cletis [place], Technician Referring MD:             hospital team Medicines:                Fentanyl 50 micrograms IV bolus (also on Fentanyl                            drip), Midazolam 5 mg IV; Patient on ventilator Complications:            No immediate complications. Estimated Blood Loss:     Estimated blood loss: 10 mL requiring treatment                            with epinephrine. Procedure:                Pre-Anesthesia Assessment:                           - Prior to the procedure, a History and Physical                            was performed, and patient medications and                            allergies were reviewed. The patient's tolerance of                            previous anesthesia was also reviewed. The risks                            and benefits of the procedure and the sedation                            options and risks were discussed with the patient.                            All questions were answered, and informed consent                            was obtained. Prior Anticoagulants: The patient has                            taken no previous anticoagulant or antiplatelet  agents. ASA Grade Assessment: IV - A patient with                            severe systemic disease that is a constant threat                            to life. After reviewing the risks and benefits,                            the patient was deemed in satisfactory condition to                            undergo the procedure.                           After  obtaining informed consent, the endoscope was                            passed under direct vision. Throughout the                            procedure, the patient's blood pressure, pulse, and                            oxygen saturations were monitored continuously. The                            [id number]) scope was introduced through the                            mouth, and advanced to the second part of duodenum.                            The upper GI endoscopy was performed with                            difficulty due to excessive bleeding and the                            patient's combativeness. Successful completion of                            the procedure was aided by increasing the dose of                            sedation medication and controlling the bleeding.                            The patient tolerated the procedure fairly well. Scope In: Scope Out: Findings:      The examined esophagus was normal.      The Z-line was regular and was found 42 cm from the incisors.      A few localized, small non-bleeding erosions were found in the gastric  antrum. There were no stigmata of recent bleeding.      Red blood was found in the gastric body.      Red blood was found in the duodenal bulb and in the second portion of       the duodenum. Moderate-sized bright red blood clot seen in the 2nd       portion of the duodenum.      One oozing superficial duodenal ulcer was found in the duodenal bulb.       The lesion was 10 mm in largest dimension. Area was unsuccessfully       injected with 6 mL of a 1:10,000 solution of epinephrine for hemostasis.       Bleeding slowed significantly but not completely stopped and no visible       vessel seen but entire ulcer not visualized due to the acute angle.      The second portion of the duodenum was normal.      A small hiatal hernia was present. Impression:               - Normal esophagus.                            - Z-line regular, 42 cm from the incisors.                           - Non-bleeding erosive gastropathy.                           - Red blood in the gastric body.                           - Blood in the duodenal bulb and in the second                            portion of the duodenum.                           - One oozing duodenal ulcer. Treatment not                            successful.                           - Normal second portion of the duodenum.                           - Small hiatal hernia.                           - No specimens collected. Moderate Sedation:      Moderate (conscious) sedation was administered by the endoscopy nurse       and supervised by the endoscopist. The following parameters were       monitored: oxygen saturation, heart rate, blood pressure, and response       to care. Recommendation:           - Give Protonix (pantoprazole): initiate therapy  with 80 mg IV bolus, then 8 mg/hr IV by continuous                            infusion.                           - The findings and recommendations were discussed                            with the referring physician.                           - The findings and recommendations were discussed                            with the patient's family.                           - NPO.                           - Post procedure medication orders were given. Procedure Code(s):        --- Professional ---                           [id number], Esophagogastroduodenoscopy, flexible,                            transoral; with control of bleeding, any method Diagnosis Code(s):        --- Professional ---                           K92.1, Melena (includes Hematochezia)                           K92.0, Hematemesis                           K26.4, Chronic or unspecified duodenal ulcer with                            hemorrhage                           K92.2, Gastrointestinal hemorrhage,  unspecified                           K31.89, Other diseases of stomach and duodenum                           K44.9, Diaphragmatic hernia without obstruction or                            gangrene CPT copyright 2016 [employer]. All rights reserved. The codes documented in this report are preliminary and upon coder review may  be revised to meet current compliance requirements. Wilford Corner, MD Lear Ng, MD 06/26/2015 11:22:58 PM This report  has been signed electronically. Number of Addenda: 0

## 2015-07-11 NOTE — H&P (View-Only) (Signed)
Referring Provider: Dr. Oletta Darter Primary Care Physician:  Pcp Not In System Primary Gastroenterologist: Unassigned  Reason for Consultation:  GI bleed  HPI: [name] Terry is a 65 y.o. female with multiple medical problems as stated below called for a consult due to onset of black stools this afternoon described as a large amount of black stools X 3. Also, a large amount (> 500cc) of dark red and black fluid from suctioned from NG tube this afternoon. S/P repeat bronchoscopy today that showed old blood in the right lower lobe. Patient is s/p V fib arrest last week and earlier that day a bronchoscopy showed a pulmonary hemorrhage. Hgb 7.3 this morning and 5.2 this evening. Hypotensive earlier this evening to the 70's/40's. Patient intubated and sedated. No family in room. Negative EGD/colonoscopy in January 2017 by Dr. Oletta Lamas that was done due to anemia and dark stool. INR 3.2 this morning and now 1.76 s/p Vit. K. BUN 60/Cr 3. Hypertensive this evening. Receiving 1st of 3 units of PRBCs. Tachycardic this afternoon with Afib on amiodarone.    Past Medical History  Diagnosis Date  . Ischemic cardiomyopathy     a. s/p MDT dual chamber ICD implanted 2013 by Dr Westley Gambles at [facility], now followed by Dr Rayann Heman. b. LVEF 30-35% by echo 01/2015 at [facility] per report; 35-40% by echo 01/2015 at [facility].  Marland Kitchen History of stroke      a. 1993  . Coronary artery disease     a. s/p previous interventions in [place] per patient, no records available. b. cath 04/2013 with non-obstructive disease. c. STEMI 01/2015 @ [facility] - cardiac cath had shown 100% stenosis of prox PDA, patent stent in RCA with otherwise normal coronary arteries.  . Lymphoma ([place])     a. s/p chemo/radiation 2009  . Chronic systolic CHF (congestive heart failure) ([place])   . HTN (hypertension)   . HLD (hyperlipidemia)   . Ventricular tachycardia ([place])     a. CL 300 msec requiring ICD shocks therpay 5/14 b. recurrent VT 04/2014, placed on amiodarone c.  recurrent VT 05/2014 s/p ablation by Dr Rayann Heman d. s/p repeat ablation 07/2014  . DM2 (diabetes mellitus, type 2), newly diagnosed    . CKD (chronic kidney disease) stage 3, GFR 30-59 ml/min    . Polymyalgia rheumatica ([place])   . Depression   . GERD (gastroesophageal reflux disease)   . Renal atrophy, left     a. [place] 02/2015: "Left renal atrophy, likely from chronic renal arterial stenosis"  . Mitral regurgitation     a. mod by echo 01/2015.  . Diverticulosis     a. By CT 02/2015.  Marland Kitchen PAF (paroxysmal atrial fibrillation) ([place])     a. Brief paroxysms during admission 02/2015 - amiodarone increased. Not placed on anticoag due to dual antiplatelet therapy and bleeding risk including in the setting of possible GI pathology.   . Hiatal hernia     a. by EGD 06/2014.  Marland Kitchen Gastritis     a. by EGD 06/2014.  Marland Kitchen Anxiousness 03/02/2015    Past Surgical History  Procedure Laterality Date  . Cardiac defibrillator placement  03/2011    MDT ICD implanted at [facility] by Dr Tana Coast  . Coronary angioplasty with stent placement    . Left and right heart catheterization with coronary angiogram N/A 05/03/2013    non-obstructive CAD  . V-tach ablation N/A 05/26/2014    VT ablation by Dr Rayann Heman - PVCs arising from the inferolateral LV, extensive substrate ablation  .  Electrophysiologic study N/A 08/04/2014    Procedure: V Tach Ablation;  Surgeon: Hillis Range, MD;  Location: [facility] INVASIVE CV LAB;  Service: Cardiovascular;  Laterality: N/A;  . Esophagogastroduodenoscopy (egd) with propofol N/A 04/03/2015    Dr. Randa Evens: normal  . Colonoscopy N/A 04/07/2015    Dr. Randa Evens: diverticulosis, internal hemorrhoids   . Pacemaker/defibrillator    . Cardiac catheterization N/A 07/05/2015    Procedure: Right/Left Heart Cath and Coronary Angiography;  Surgeon: Lennette Bihari, MD;  Location: [facility] INVASIVE CV LAB;  Service: Cardiovascular;  Laterality: N/A;  . Tee without cardioversion N/A 06/24/2015    Procedure: TRANSESOPHAGEAL ECHOCARDIOGRAM  (TEE);  Surgeon: Quintella Reichert, MD;  Location: [facility] ENDOSCOPY;  Service: Cardiovascular;  Laterality: N/A;    Prior to Admission medications   Medication Sig Start Date End Date Taking? Authorizing Provider  allopurinol (ZYLOPRIM) 100 MG tablet Take 100 mg by mouth daily.   Yes Historical Provider, MD  ALPRAZolam (XANAX) 0.25 MG tablet Take 1-2 tablets (0.25-0.5 mg total) by mouth 3 (three) times daily as needed for anxiety. 04/19/15  Yes Erick Blinks, MD  amiodarone (PACERONE) 200 MG tablet Take 1 tablet (200 mg total) by mouth 2 (two) times daily. Patient taking differently: Take 200 mg by mouth daily.  02/28/15  Yes Dayna N Dunn, PA-C  aspirin EC 81 MG tablet Take 81 mg by mouth daily.   Yes Historical Provider, MD  atorvastatin (LIPITOR) 40 MG tablet Take 1 tablet (40 mg total) by mouth daily at 6 PM. 06/20/14  Yes Hillis Range, MD  carvedilol (COREG) 6.25 MG tablet Take 1 tablet (6.25 mg total) by mouth 2 (two) times daily with a meal. 04/08/15  Yes Costin Otelia Sergeant, MD  colchicine 0.6 MG tablet Take 0.6 mg by mouth 2 (two) times daily.   Yes Historical Provider, MD  ferrous sulfate 325 (65 FE) MG tablet Take 325 mg by mouth 2 (two) times daily with a meal.   Yes Historical Provider, MD  hydrALAZINE (APRESOLINE) 25 MG tablet Take 25 mg by mouth 3 (three) times daily.   Yes Historical Provider, MD  isosorbide dinitrate (ISORDIL) 20 MG tablet Take 20 mg by mouth 3 (three) times daily.   Yes Historical Provider, MD  levothyroxine (SYNTHROID, LEVOTHROID) 50 MCG tablet Take 1 tablet (50 mcg total) by mouth daily before breakfast. Patient taking differently: Take 75 mcg by mouth daily before breakfast.  03/12/15  Yes Drema Dallas, MD  Multiple Vitamin (MULTI VITAMIN DAILY PO) Take 1 tablet by mouth daily.   Yes Historical Provider, MD  nitroGLYCERIN (NITROSTAT) 0.4 MG SL tablet Place 1 tablet (0.4 mg total) under the tongue every 5 (five) minutes as needed for chest pain. 07/04/14  Yes Amber Caryl Bis, NP  oxyCODONE-acetaminophen (PERCOCET/ROXICET) 5-325 MG tablet Take 1 tablet by mouth every 8 (eight) hours as needed for severe pain. 04/08/15  Yes Costin Otelia Sergeant, MD  pantoprazole (PROTONIX) 40 MG tablet Take 1 tablet (40 mg total) by mouth 2 (two) times daily. Patient taking differently: Take 40 mg by mouth daily.  02/28/15  Yes Dayna N Dunn, PA-C  PARoxetine (PAXIL) 10 MG tablet Take 1 tablet (10 mg total) by mouth daily. 03/12/15  Yes Drema Dallas, MD  potassium chloride SA (K-DUR,KLOR-CON) 20 MEQ tablet Take 20 mEq by mouth 3 (three) times a week. Monday, Wednesday and Friday   Yes Historical Provider, MD  promethazine (PHENERGAN) 25 MG tablet Take 25 mg by mouth every 6 (six) hours  as needed for nausea or vomiting.   Yes Historical Provider, MD  QUEtiapine (SEROQUEL) 25 MG tablet Take 25 mg by mouth at bedtime.   Yes Historical Provider, MD  ranitidine (ZANTAC) 150 MG tablet Take 150 mg by mouth at bedtime.   Yes Historical Provider, MD  senna-docusate (SENOKOT-S) 8.6-50 MG tablet Take 1 tablet by mouth 2 (two) times daily as needed for mild constipation.    Yes Historical Provider, MD  sodium chloride (OCEAN) 0.65 % SOLN nasal spray Place 1 spray into both nostrils daily as needed for congestion.   Yes Historical Provider, MD  torsemide (DEMADEX) 20 MG tablet Take 1 tablet (20 mg total) by mouth daily. Patient taking differently: Take 20 mg by mouth 3 (three) times a week. Monday, Wednesday and Friday 03/12/15  Yes Allie Bossier, MD  warfarin (COUMADIN) 5 MG tablet Take 5 mg by mouth at bedtime.   Yes Historical Provider, MD    Scheduled Meds: . sodium chloride   Intravenous Once  . sodium chloride   Intravenous Once  . allopurinol  100 mg Per Tube Daily  . antiseptic oral rinse  7 mL Mouth Rinse 10 times per day  . aspirin  81 mg Per Tube Daily  . chlorhexidine gluconate (SAGE KIT)  15 mL Mouth Rinse BID  . colchicine  0.3 mg Oral Daily  . feeding supplement (PRO-STAT  SUGAR FREE 64)  30 mL Per Tube BID  . feeding supplement (VITAL HIGH PROTEIN)  1,000 mL Per Tube Q24H  . ferrous sulfate  300 mg Per Tube BID WC  . furosemide  80 mg Intravenous BID  . insulin aspart  0-9 Units Subcutaneous 6 times per day  . levothyroxine  100 mcg Per Tube QAC breakfast  . PARoxetine  10 mg Oral Daily  . potassium chloride  40 mEq Oral BID  . QUEtiapine  25 mg Oral QHS  . sodium chloride flush  10-40 mL Intracatheter Q12H   Continuous Infusions: . sodium chloride Stopped (07/09/15 1329)  . amiodarone 30 mg/hr (07/23/2015 1900)  . fentaNYL infusion INTRAVENOUS 350 mcg/hr (07/17/2015 2033)  . norepinephrine (LEVOPHED) Adult infusion Stopped (07/17/2015 1230)  . pantoprozole (PROTONIX) infusion 8 mg/hr (07/03/2015 1915)   PRN Meds:.[DISCONTINUED] acetaminophen **OR** acetaminophen, acetaminophen, albuterol, diphenhydrAMINE **OR** diphenhydrAMINE, fentaNYL, midazolam, nitroGLYCERIN, ondansetron (ZOFRAN) IV, ondansetron **OR** [DISCONTINUED] ondansetron (ZOFRAN) IV, promethazine, sodium chloride flush  Allergies as of 07/17/2015 - Review Complete 06/30/2015  Allergen Reaction Noted  . Flagyl [metronidazole] Swelling and Rash 07/19/2012  . Contrast media [iodinated diagnostic agents] Rash 07/19/2012  . Ioxaglate Rash 05/25/2014  . Potassium sulfate Rash and Other (See Comments) 05/25/2014  . Potassium-containing compounds Other (See Comments) 07/19/2012    Family History  Problem Relation Age of Onset  . Diabetes Father   . Hypertension Father   . Heart disease Father   . Alcoholism Father   . Hypertension Mother   . Heart disease Mother   . Breast cancer Mother   . Stroke Mother   . Diabetes Brother   . Diabetes Brother   . Diabetes Brother   . Diabetes Brother   . Hypertension Sister   . Heart disease Sister   . Alcoholism Sister   . Thyroid disease Sister   . Heart attack Sister   . Heart attack Brother   . Stroke Brother   . Colon cancer Neg Hx     Social  History   Social History  . Marital Status: Single  Spouse Name: N/A  . Number of Children: 2  . Years of Education: N/A   Occupational History  . Not on file.   Social History Main Topics  . Smoking status: Former Smoker -- 0.25 packs/day    Quit date: 05/20/2014  . Smokeless tobacco: Never Used     Comment: she is not ready to quit  . Alcohol Use: No  . Drug Use: No  . Sexual Activity: Not on file   Other Topics Concern  . Not on file   Social History Narrative    Review of Systems: All negative except as stated above in HPI.  Physical Exam: Vital signs: Filed Vitals:   07/07/2015 2027 07/21/2015 2030  BP:  161/113  Pulse: 109 110  Temp:    Resp: 21 16  T 99.7 Last BM Date: 07/10/2015 General:   Sedated, intubated HEENT: atraumatic Neck: supple, nontender Lungs:  Coarse breath sounds Heart:  Irregular rate and rhythm Abdomen: +tenderness diffusely (facial grimace), nondistended, +BS Rectal:  Deferred Ext: no edema  GI:  Lab Results:  Recent Labs  07/10/15 0440 07/13/2015 0400 06/24/2015 1847  WBC 8.9 9.6 6.8  HGB 7.0* 7.3* 5.2*  HCT 22.5* 23.5* 16.2*  PLT 178 204 118*   BMET  Recent Labs  07/09/15 0416 07/10/15 0440 07/12/2015 0400  NA 143 144 148*  K 3.7 4.2 4.2  CL 105 104 107  CO2 '29 29 29  '$ GLUCOSE 146* 135* 162*  BUN 43* 48* 60*  CREATININE 2.36* 2.62* 3.00*  CALCIUM 8.7* 8.8* 9.2   LFT No results for input(s): PROT, ALBUMIN, AST, ALT, ALKPHOS, BILITOT, BILIDIR, IBILI in the last 72 hours. PT/INR  Recent Labs  06/26/2015 0400 07/08/2015 1848  LABPROT 32.1* 20.5*  INR 3.20* 1.76*     Studies/Results: Dg Chest Port 1 View  07/06/2015  CLINICAL DATA:  Acute respiratory failure, ischemic cardiomyopathy, diabetes EXAM: PORTABLE CHEST 1 VIEW COMPARISON:  Portable chest x-ray of July 10, 2015. FINDINGS: The lungs are borderline hypoinflated. The interstitial markings have improved slightly. There remain confluent alveolar opacities in both  lungs. The left hemidiaphragm it is partially obscured. The heart remains enlarged. The permanent pacemaker defibrillator is in stable position. There is very high positioning of the endotracheal tube approximately 3.1 cm above the superior margin of the clavicular heads which is almost 10 cm above the carina. The PICC line tip projects over the midportion of the SVC. The esophagogastric tube tip projects below the inferior margin of the image. IMPRESSION: 1. High positioning of the endotracheal tube. Advancement by at least 5 cm is needed. Critical Value/emergent results were called by telephone at the time of interpretation on 07/10/2015 at 7:14 am to Lesli Albee, RN, who verbally acknowledged these results. 2. Mild improvement in alveolar opacities bilaterally. Stable cardiomegaly with mild pulmonary vascular congestion. Electronically Signed   By: David  Martinique M.D.   On: 07/22/2015 07:16   Dg Chest Port 1 View  07/10/2015  CLINICAL DATA:  Acute respiratory failure, cardiomyopathy and coronary artery and valvular heart disease EXAM: PORTABLE CHEST 1 VIEW COMPARISON:  Portable chest x-ray of July 09, 2015 FINDINGS: The lungs are slightly better inflated today. There are increased interstitial opacities bilaterally however. There is pleural fluid on the left blunting the lateral costophrenic angle. The cardiac silhouette remains enlarged. The pulmonary vascularity remains engorged. The permanent pacemaker/defibrillator is in stable position. The endotracheal tube tip lies approximately 3.8 cm above the carina. The esophagogastric tube tip projects below the  inferior margin of the image. IMPRESSION: CHF with pulmonary interstitial edema and small left pleural effusion. There has been mild interval deterioration in the appearance of the pulmonary interstitium since the previous study. The support apparatus are in stable position. Electronically Signed   By: David  Martinique M.D.   On: 07/10/2015 07:20     Impression/Plan: 65 yo with onset of melena and blood from NGT concerning for a peptic ulcer bleed although it could be residual blood from recent pulmonary hemorrhage. Bedside EGD now. Protonix drip. Agree with transfusion. Supportive care.    LOS: 14 days   [place] C.  07/22/2015, 9:20 PM  Pager [phone]  If no answer or after 5 PM call [phone]

## 2015-07-11 NOTE — Progress Notes (Signed)
PULMONARY / CRITICAL CARE MEDICINE   Name: [name] Terry MRN: [id number] DOB: 1950-08-27    ADMISSION DATE:  07/04/2015 CONSULTATION DATE:  07/04/15  REFERRING MD:  Fanny Bien  CHIEF COMPLAINT:  Respiratory arrest  HISTORY OF PRESENT ILLNESS:    [name] Terry is 65 yo female with past medical history significant for ischemic cardiomyopathy, stroke, coronary artery disease, lymphoma, CHF  S/p ICD placement ,HTN,HLD, Venticular tachycardia,chronic kidney disease -stage 3, depression, GERD.  Patient presented to ED with chest pain and LHC was not indicative of PCI at the time and was medically managed.  Started on empiric milrinone 07/02/15 with presentation concerning for low output On 4/11 developed  respiratory arrest.  Patient never lost her pulse and was intubated , now on vent. Developed VF arrest 4/11 evening requiring multiple shocks  SUBJECTIVE:  No further bleeding from endotracheal tube Bloody bowel movement this morning Oxygenation improved   VITAL SIGNS: BP 132/80 mmHg  Pulse 73  Temp(Src) 99 F (37.2 C) (Oral)  Resp 11  Ht 5\' 5"  (1.651 m)  Wt 85.8 kg (189 lb 2.5 oz)  BMI 31.48 kg/m2  SpO2 100%  HEMODYNAMICS: CVP:  [8 mmHg-12 mmHg] 10 mmHg  VENTILATOR SETTINGS: Vent Mode:  [-] PRVC FiO2 (%):  [40 %-60 %] 40 % Set Rate:  [12 bmp] 12 bmp Vt Set:  [500 mL] 500 mL PEEP:  [10 cmH20] 10 cmH20 Plateau Pressure:  [23 cmH20-28 cmH20] 23 cmH20  INTAKE / OUTPUT: I/O last 3 completed shifts: In: 2982.7 [I.V.:1846.8; Blood:495; NG/GT:540.8; IV Piggyback:100] Out: 2370 [Urine:1670; Emesis/NG output:700]  PHYSICAL EXAMINATION:  Gen: sedated on vent, arouses to voice, doesn't follow commands HENT: NCAT ETT in place PULM: Clear to auscultation bilaterally, vent supported breaths CV: RRR on my exam, gallop noted, systolic murmur GI: BS+, soft, nontender MSK: normal bulk and tone Neuro: sedated on vent, arouses to voice, doesn't follow commands  LABS:  BMET  Recent  Labs Lab 07/09/15 0416 07/10/15 0440 07/07/2015 0400  NA 143 144 148*  K 3.7 4.2 4.2  CL 105 104 107  CO2 29 29 29   BUN 43* 48* 60*  CREATININE 2.36* 2.62* 3.00*  GLUCOSE 146* 135* 162*    Electrolytes  Recent Labs Lab 07/04/15 1410  07/06/15 0400 07/07/15 0255  07/09/15 0416 07/10/15 0440 07/18/2015 0400  CALCIUM 8.2*  < > 8.3* 8.3*  < > 8.7* 8.8* 9.2  MG 2.4  --  1.9 2.2  --   --   --   --   PHOS  --   --  4.0 4.3  --   --   --   --   < > = values in this interval not displayed.  CBC  Recent Labs Lab 07/09/15 0416 07/10/15 0440 06/29/2015 0400  WBC 4.5 8.9 9.6  HGB 8.3* 7.0* 7.3*  HCT 27.1* 22.5* 23.5*  PLT 189 178 204    Coag's  Recent Labs Lab 07/10/15 0030 07/10/15 0440 07/16/2015 0400  INR 3.07* 2.95* 3.20*    Sepsis Markers No results for input(s): LATICACIDVEN, PROCALCITON, O2SATVEN in the last 168 hours.  ABG  Recent Labs Lab 07/06/15 0527 07/07/15 0423 07/10/15 1020  PHART 7.369 7.387 7.404  PCO2ART 45.0 44.0 46.8*  PO2ART 71.0* 129* 190*    Liver Enzymes  Recent Labs Lab 07/04/15 1410 07/05/15 0530 07/08/15 0425  AST 96* 620* 92*  ALT 55* 389* 174*  ALKPHOS 67 66 62  BILITOT 0.8 1.0 0.8  ALBUMIN 2.5* 1.9* 1.9*  Cardiac Enzymes  Recent Labs Lab 07/04/15 1545 07/04/15 2043 07/05/15 0400  TROPONINI 0.08* 0.49* 1.18*    Glucose  Recent Labs Lab 07/10/15 0837 07/10/15 1124 07/10/15 1610 07/10/15 2002 07/10/15 2311 06/30/2015 0736  GLUCAP 114* 126* 133* 112* 123* 142*    Imaging Dg Chest Port 1 View  06/27/2015  CLINICAL DATA:  Acute respiratory failure, ischemic cardiomyopathy, diabetes EXAM: PORTABLE CHEST 1 VIEW COMPARISON:  Portable chest x-ray of July 10, 2015. FINDINGS: The lungs are borderline hypoinflated. The interstitial markings have improved slightly. There remain confluent alveolar opacities in both lungs. The left hemidiaphragm it is partially obscured. The heart remains enlarged. The permanent  pacemaker defibrillator is in stable position. There is very high positioning of the endotracheal tube approximately 3.1 cm above the superior margin of the clavicular heads which is almost 10 cm above the carina. The PICC line tip projects over the midportion of the SVC. The esophagogastric tube tip projects below the inferior margin of the image. IMPRESSION: 1. High positioning of the endotracheal tube. Advancement by at least 5 cm is needed. Critical Value/emergent results were called by telephone at the time of interpretation on 07/13/2015 at 7:14 am to Lesli Albee, RN, who verbally acknowledged these results. 2. Mild improvement in alveolar opacities bilaterally. Stable cardiomegaly with mild pulmonary vascular congestion. Electronically Signed   By: David  Martinique M.D.   On: 07/23/2015 07:16     STUDIES:  4/6 TEE 4/7 RHC And Coronary angiography Distal LAD , 30% stenosed, proximal Circumflex lesion 405 stenosed, proximal circumflex to mid circumflex 50% stenosed,1st Mrg lesion, 75% stenosed,1st RPLB lesion, 99% stenosed. 4/11 Bronchoscopy per Dr. Nelda Marseille "right mainstem hemorrhage"  CULTURES: 4/11 BC >> 4/11 sputum>> nml flora 4/11 urine>> e coli  4/17 resp >   ANTIBIOTICS:  4/11 zosyn>>  SIGNIFICANT EVENTS:  LINES/TUBES: PICC 4/8>>> 4/11 ET>>>  DISCUSSION: 65 YO female with past medical Hx of  ischemic cardiomyopathy, stroke, coronary artery disease, lymphoma, CHF  S/p ICD placement ,HTN,HLD, Venticular tachycardia,chronic kidney disease -stage 3, depression, GERD, NSTEMI, had repiratory arrested,intubated and currently on vent.  Remains in renal failure and had hemoptysis overnight on 4/17 in setting of worsening hemoptysis.  Was noted to have bleeding on bronchoscopy on 4/11, recurrent in setting of elevated INR 4/17.  ASSESSMENT / PLAN:  PULMONARY A: Acute Hypoxemic respiratory failure Bit tongue > better Pulmonary edema Hemoptysis 4/16 in setting of elevated INR >  resolved, uncertain etiology P:   Plan bronchoscopy today to evaluate cause of bleeding Continue diuresis per cardiology Wean PEEP/FiO2 today Duoneb/albuterol   CARDIOVASCULAR A:  NSTEMI Chronic systolic CHF-EF 123456, severe MR Hx of CAD, S/P stenting PAF P:  Continue amiodarone per cardiology Tele  RENAL A:   Chronic kidney disease -stage 3 Hyponatremia -resolved  P:   Monitor BMET and UOP Replace electrolytes as needed Lasix per cardiology  GASTROINTESTINAL A:  Diarrhea C-DIFF- Antigen+, toxin -negative -repeat same GERD Hematochezia in setting of elevated INR P:  TF per nutrition > npo given hematochezia Protonix to continue See Heme Adjust OG tube (coiled in throat on CXR)  HEMATOLOGIC A:   Coagulopathy > due to warfarin P:  SCD FFP/Vit K now Repeat CBC later today Hold oral coumadin Repeat CBC later today  INFECTIOUS A:   Aspiration PNA E coli UTI C diff colonizer P:   Hold antibiotics Repeat resp culture given worsening CXR findings  ENDOCRINE A:   DM Hypothyroidism P:   SSI coverage Levothyroxine -via tube  NEUROLOGIC A:   Hx OF Depression/ anxiety P:   RASS goal -1 Hold paxil/ ct seroquel Continue fentanyl gtt per PAD protocol  FAMILY  - Updates: No family bedside  - Inter-disciplinary family meet or Palliative Care meeting due by: 07/10/15 > discuss with cardiology today   The patient is critically ill with multiple organ systems failure and requires high complexity decision making for assessment and support, frequent evaluation and titration of therapies, application of advanced monitoring technologies and extensive interpretation of multiple databases. Critical Care Time devoted to patient care services described in this note independent of APP time is 37 minutes.   Roselie Awkward, MD [place] [facility] Pager: [phone] Cell: [phone] After 3pm or if no response, call [phone]    07/02/2015

## 2015-07-11 NOTE — Progress Notes (Signed)
Nutrition Follow-up  DOCUMENTATION CODES:   Obesity unspecified  INTERVENTION:   Once able to resume TF recommend: Vital High Protein @ 40 ml/hr 30 ml Prostat BID Provides: 1160 kcal, 114 grams protein, and 802 ml H2O.  NUTRITION DIAGNOSIS:   Inadequate oral intake related to inability to eat as evidenced by NPO status. Ongoing.   GOAL:   Provide needs based on ASPEN/SCCM guidelines Not met due to change in status  MONITOR:   Vent status, Labs, I & O's, TF tolerance  ASSESSMENT:   [name] Terry is a 65 y.o. female with history of CAD status post stenting, chronic systolic heart failure status post ICD placement, proximal respiratory ablation, diabetes mellitus, chronic anemia, chronic kidney disease stage IV process to the ER because of chest pain. Patient has been having chest pain since yesterday morning  Patient is currently intubated on ventilator support MV: 5.7 L/min Temp (24hrs), Avg:98.3 F (36.8 C), Min:94.4 F (34.7 C), Max:99.1 F (37.3 C)  4/7 heart cath 4/9 IV milrinone 4/11 multiple arrests, intubated 4/17 TF stopped at 1020 due to hematochezia  Medications reviewed and include: ferrous sulfate, lasix Labs reviewed: sodium elevated 148, BUN/Cr elevated 60/3 CBG's: 142-148  Diet Order:  Diet NPO time specified  Skin:  Reviewed, no issues  Last BM:  4/18 diarrhea  Height:   Ht Readings from Last 1 Encounters:  07/06/15 _0  (1.651 m)    Weight:   Wt Readings from Last 1 Encounters:  07/14/2015 189 lb 2.5 oz (85.8 kg)    Ideal Body Weight:  56.81 kg  BMI:  Body mass index is 31.48 kg/(m^2).  Estimated Nutritional Needs:   Kcal:  720-9470  Protein:  >/=113 grams  Fluid:  >/= 1.6L  EDUCATION NEEDS:   No education needs identified at this time  [place], [place], [place] Pager [phone] After Hours Pager

## 2015-07-11 NOTE — Progress Notes (Signed)
eLink Physician-Brief Progress Note Patient Name: [name] Terry DOB: 02/07/1951 MRN: [id number]   Date of Service  06/24/2015  HPI/Events of Note  Bleeding from NGT and rectum. CBC post transfusion being sent now. BP =  157/93 and HR = 94.   eICU Interventions  Will order: 1. PT/INR and PTT now.  2. Will bolus with Protonix and start on Protonix IV infusion.  3. Will send the NP, Georgann Housekeeper, to evaluate the patient at the bedside.     Intervention Category Intermediate Interventions: Bleeding - evaluation and treatment with blood products;Coagulopathy - evaluation and management  Sommer,Steven Eugene 07/08/2015, 5:55 PM

## 2015-07-11 NOTE — Progress Notes (Signed)
RT advanced patients tube back to 23 at the lip. Tube was found at 18. RT will continue to monitor.

## 2015-07-11 NOTE — Brief Op Note (Signed)
Ulcerated duodenal bulb (posterior wall) without a visible vessel identified but distal end of ulcer not able to be seen due to angle despite repeated maneuvers and loop reduction. 6 cc of [id number] mixture injected submucosally with good blanching and decrease in bleeding. Small amount of oozing persisting from ulcerated area. Moderate amount of red blood and clots in stomach. Bright red blood clot in 2nd part of duodenum during insertion of endoscope. See procedure note for details. Continue OG suction intermittently. Continue Protonix drip and supportive care. If BRB per OG then would recommend IR evaluation for possible embolization vs surgery. Unclear cause of ulcer and ischemia in differential. D/W daughter by phone.

## 2015-07-11 NOTE — Consult Note (Signed)
Referring Provider: Dr. Oletta Darter Primary Care Physician:  Pcp Not In System Primary Gastroenterologist: Unassigned  Reason for Consultation:  GI bleed  HPI: [name] Terry is a 65 y.o. female with multiple medical problems as stated below called for a consult due to onset of black stools this afternoon described as a large amount of black stools X 3. Also, a large amount (> 500cc) of dark red and black fluid from suctioned from NG tube this afternoon. S/P repeat bronchoscopy today that showed old blood in the right lower lobe. Patient is s/p V fib arrest last week and earlier that day a bronchoscopy showed a pulmonary hemorrhage. Hgb 7.3 this morning and 5.2 this evening. Hypotensive earlier this evening to the 70's/40's. Patient intubated and sedated. No family in room. Negative EGD/colonoscopy in January 2017 by Dr. Oletta Lamas that was done due to anemia and dark stool. INR 3.2 this morning and now 1.76 s/p Vit. K. BUN 60/Cr 3. Hypertensive this evening. Receiving 1st of 3 units of PRBCs. Tachycardic this afternoon with Afib on amiodarone.    Past Medical History  Diagnosis Date  . Ischemic cardiomyopathy     a. s/p MDT dual chamber ICD implanted 2013 by Dr Westley Gambles at [facility], now followed by Dr Rayann Heman. b. LVEF 30-35% by echo 01/2015 at [facility] per report; 35-40% by echo 01/2015 at [facility].  Marland Kitchen History of stroke      a. 1993  . Coronary artery disease     a. s/p previous interventions in [place] per patient, no records available. b. cath 04/2013 with non-obstructive disease. c. STEMI 01/2015 @ [place] - cardiac cath had shown 100% stenosis of prox PDA, patent stent in RCA with otherwise normal coronary arteries.  . Lymphoma ([place])     a. s/p chemo/radiation 2009  . Chronic systolic CHF (congestive heart failure) ([place])   . HTN (hypertension)   . HLD (hyperlipidemia)   . Ventricular tachycardia ([place])     a. CL 300 msec requiring ICD shocks therpay 5/14 b. recurrent VT 04/2014, placed on amiodarone c.  recurrent VT 05/2014 s/p ablation by Dr Rayann Heman d. s/p repeat ablation 07/2014  . DM2 (diabetes mellitus, type 2), newly diagnosed    . CKD (chronic kidney disease) stage 3, GFR 30-59 ml/min    . Polymyalgia rheumatica ([place])   . Depression   . GERD (gastroesophageal reflux disease)   . Renal atrophy, left     a. [place] 02/2015: "Left renal atrophy, likely from chronic renal arterial stenosis"  . Mitral regurgitation     a. mod by echo 01/2015.  . Diverticulosis     a. By CT 02/2015.  Marland Kitchen PAF (paroxysmal atrial fibrillation) ([place])     a. Brief paroxysms during admission 02/2015 - amiodarone increased. Not placed on anticoag due to dual antiplatelet therapy and bleeding risk including in the setting of possible GI pathology.   . Hiatal hernia     a. by EGD 06/2014.  Marland Kitchen Gastritis     a. by EGD 06/2014.  Marland Kitchen Anxiousness 03/02/2015    Past Surgical History  Procedure Laterality Date  . Cardiac defibrillator placement  03/2011    MDT ICD implanted at [facility] by Dr Tana Coast  . Coronary angioplasty with stent placement    . Left and right heart catheterization with coronary angiogram N/A 05/03/2013    non-obstructive CAD  . V-tach ablation N/A 05/26/2014    VT ablation by Dr Rayann Heman - PVCs arising from the inferolateral LV, extensive substrate ablation  .  Electrophysiologic study N/A 08/04/2014    Procedure: V Tach Ablation;  Surgeon: Thompson Grayer, MD;  Location: [place] CV LAB;  Service: Cardiovascular;  Laterality: N/A;  . Esophagogastroduodenoscopy (egd) with propofol N/A 04/03/2015    Dr. Oletta Lamas: normal  . Colonoscopy N/A 04/07/2015    Dr. Oletta Lamas: diverticulosis, internal hemorrhoids   . Pacemaker/defibrillator    . Cardiac catheterization N/A 07/21/2015    Procedure: Right/Left Heart Cath and Coronary Angiography;  Surgeon: Troy Sine, MD;  Location: [place] [facility];  Service: Cardiovascular;  Laterality: N/A;  . Tee without cardioversion N/A 07/02/2015    Procedure: TRANSESOPHAGEAL ECHOCARDIOGRAM  (TEE);  Surgeon: Sueanne Margarita, MD;  Location: [facility] ENDOSCOPY;  Service: Cardiovascular;  Laterality: N/A;    Prior to Admission medications   Medication Sig Start Date End Date Taking? Authorizing Provider  allopurinol (ZYLOPRIM) 100 MG tablet Take 100 mg by mouth daily.   Yes Historical Provider, MD  ALPRAZolam (XANAX) 0.25 MG tablet Take 1-2 tablets (0.25-0.5 mg total) by mouth 3 (three) times daily as needed for anxiety. 04/19/15  Yes Kathie Dike, MD  amiodarone (PACERONE) 200 MG tablet Take 1 tablet (200 mg total) by mouth 2 (two) times daily. Patient taking differently: Take 200 mg by mouth daily.  02/28/15  Yes Dayna N Dunn, PA-C  aspirin EC 81 MG tablet Take 81 mg by mouth daily.   Yes Historical Provider, MD  atorvastatin (LIPITOR) 40 MG tablet Take 1 tablet (40 mg total) by mouth daily at 6 PM. 06/20/14  Yes Thompson Grayer, MD  carvedilol (COREG) 6.25 MG tablet Take 1 tablet (6.25 mg total) by mouth 2 (two) times daily with a meal. 04/08/15  Yes Costin Karlyne Greenspan, MD  colchicine 0.6 MG tablet Take 0.6 mg by mouth 2 (two) times daily.   Yes Historical Provider, MD  ferrous sulfate 325 (65 FE) MG tablet Take 325 mg by mouth 2 (two) times daily with a meal.   Yes Historical Provider, MD  hydrALAZINE (APRESOLINE) 25 MG tablet Take 25 mg by mouth 3 (three) times daily.   Yes Historical Provider, MD  isosorbide dinitrate (ISORDIL) 20 MG tablet Take 20 mg by mouth 3 (three) times daily.   Yes Historical Provider, MD  levothyroxine (SYNTHROID, LEVOTHROID) 50 MCG tablet Take 1 tablet (50 mcg total) by mouth daily before breakfast. Patient taking differently: Take 75 mcg by mouth daily before breakfast.  03/12/15  Yes Allie Bossier, MD  Multiple Vitamin (MULTI VITAMIN DAILY PO) Take 1 tablet by mouth daily.   Yes Historical Provider, MD  nitroGLYCERIN (NITROSTAT) 0.4 MG SL tablet Place 1 tablet (0.4 mg total) under the tongue every 5 (five) minutes as needed for chest pain. 07/04/14  Yes Amber Sena Slate, NP  oxyCODONE-acetaminophen (PERCOCET/ROXICET) 5-325 MG tablet Take 1 tablet by mouth every 8 (eight) hours as needed for severe pain. 04/08/15  Yes Costin Karlyne Greenspan, MD  pantoprazole (PROTONIX) 40 MG tablet Take 1 tablet (40 mg total) by mouth 2 (two) times daily. Patient taking differently: Take 40 mg by mouth daily.  02/28/15  Yes Dayna N Dunn, PA-C  PARoxetine (PAXIL) 10 MG tablet Take 1 tablet (10 mg total) by mouth daily. 03/12/15  Yes Allie Bossier, MD  potassium chloride SA (K-DUR,KLOR-CON) 20 MEQ tablet Take 20 mEq by mouth 3 (three) times a week. Monday, Wednesday and Friday   Yes Historical Provider, MD  promethazine (PHENERGAN) 25 MG tablet Take 25 mg by mouth every 6 (six) hours  as needed for nausea or vomiting.   Yes Historical Provider, MD  QUEtiapine (SEROQUEL) 25 MG tablet Take 25 mg by mouth at bedtime.   Yes Historical Provider, MD  ranitidine (ZANTAC) 150 MG tablet Take 150 mg by mouth at bedtime.   Yes Historical Provider, MD  senna-docusate (SENOKOT-S) 8.6-50 MG tablet Take 1 tablet by mouth 2 (two) times daily as needed for mild constipation.    Yes Historical Provider, MD  sodium chloride (OCEAN) 0.65 % SOLN nasal spray Place 1 spray into both nostrils daily as needed for congestion.   Yes Historical Provider, MD  torsemide (DEMADEX) 20 MG tablet Take 1 tablet (20 mg total) by mouth daily. Patient taking differently: Take 20 mg by mouth 3 (three) times a week. Monday, Wednesday and Friday 03/12/15  Yes Allie Bossier, MD  warfarin (COUMADIN) 5 MG tablet Take 5 mg by mouth at bedtime.   Yes Historical Provider, MD    Scheduled Meds: . sodium chloride   Intravenous Once  . sodium chloride   Intravenous Once  . allopurinol  100 mg Per Tube Daily  . antiseptic oral rinse  7 mL Mouth Rinse 10 times per day  . aspirin  81 mg Per Tube Daily  . chlorhexidine gluconate (SAGE KIT)  15 mL Mouth Rinse BID  . colchicine  0.3 mg Oral Daily  . feeding supplement (PRO-STAT  SUGAR FREE 64)  30 mL Per Tube BID  . feeding supplement (VITAL HIGH PROTEIN)  1,000 mL Per Tube Q24H  . ferrous sulfate  300 mg Per Tube BID WC  . furosemide  80 mg Intravenous BID  . insulin aspart  0-9 Units Subcutaneous 6 times per day  . levothyroxine  100 mcg Per Tube QAC breakfast  . PARoxetine  10 mg Oral Daily  . potassium chloride  40 mEq Oral BID  . QUEtiapine  25 mg Oral QHS  . sodium chloride flush  10-40 mL Intracatheter Q12H   Continuous Infusions: . sodium chloride Stopped (07/09/15 1329)  . amiodarone 30 mg/hr (07/18/2015 1900)  . fentaNYL infusion INTRAVENOUS 350 mcg/hr (06/25/2015 2033)  . norepinephrine (LEVOPHED) Adult infusion Stopped (07/22/2015 1230)  . pantoprozole (PROTONIX) infusion 8 mg/hr (06/27/2015 1915)   PRN Meds:.[DISCONTINUED] acetaminophen **OR** acetaminophen, acetaminophen, albuterol, diphenhydrAMINE **OR** diphenhydrAMINE, fentaNYL, midazolam, nitroGLYCERIN, ondansetron (ZOFRAN) IV, ondansetron **OR** [DISCONTINUED] ondansetron (ZOFRAN) IV, promethazine, sodium chloride flush  Allergies as of 07/05/2015 - Review Complete 06/25/2015  Allergen Reaction Noted  . Flagyl [metronidazole] Swelling and Rash 07/19/2012  . Contrast media [iodinated diagnostic agents] Rash 07/19/2012  . Ioxaglate Rash 05/25/2014  . Potassium sulfate Rash and Other (See Comments) 05/25/2014  . Potassium-containing compounds Other (See Comments) 07/19/2012    Family History  Problem Relation Age of Onset  . Diabetes Father   . Hypertension Father   . Heart disease Father   . Alcoholism Father   . Hypertension Mother   . Heart disease Mother   . Breast cancer Mother   . Stroke Mother   . Diabetes Brother   . Diabetes Brother   . Diabetes Brother   . Diabetes Brother   . Hypertension Sister   . Heart disease Sister   . Alcoholism Sister   . Thyroid disease Sister   . Heart attack Sister   . Heart attack Brother   . Stroke Brother   . Colon cancer Neg Hx     Social  History   Social History  . Marital Status: Single  Spouse Name: N/A  . Number of Children: 2  . Years of Education: N/A   Occupational History  . Not on file.   Social History Main Topics  . Smoking status: Former Smoker -- 0.25 packs/day    Quit date: 05/20/2014  . Smokeless tobacco: Never Used     Comment: she is not ready to quit  . Alcohol Use: No  . Drug Use: No  . Sexual Activity: Not on file   Other Topics Concern  . Not on file   Social History Narrative    Review of Systems: All negative except as stated above in HPI.  Physical Exam: Vital signs: Filed Vitals:   06/27/2015 2027 07/10/2015 2030  BP:  161/113  Pulse: 109 110  Temp:    Resp: 21 16  T 99.7 Last BM Date: 07/17/2015 General:   Sedated, intubated HEENT: atraumatic Neck: supple, nontender Lungs:  Coarse breath sounds Heart:  Irregular rate and rhythm Abdomen: +tenderness diffusely (facial grimace), nondistended, +BS Rectal:  Deferred Ext: no edema  GI:  Lab Results:  Recent Labs  07/10/15 0440 07/23/2015 0400 06/29/2015 1847  WBC 8.9 9.6 6.8  HGB 7.0* 7.3* 5.2*  HCT 22.5* 23.5* 16.2*  PLT 178 204 118*   BMET  Recent Labs  07/09/15 0416 07/10/15 0440 07/03/2015 0400  NA 143 144 148*  K 3.7 4.2 4.2  CL 105 104 107  CO2 '29 29 29  '$ GLUCOSE 146* 135* 162*  BUN 43* 48* 60*  CREATININE 2.36* 2.62* 3.00*  CALCIUM 8.7* 8.8* 9.2   LFT No results for input(s): PROT, ALBUMIN, AST, ALT, ALKPHOS, BILITOT, BILIDIR, IBILI in the last 72 hours. PT/INR  Recent Labs  07/09/2015 0400 07/08/2015 1848  LABPROT 32.1* 20.5*  INR 3.20* 1.76*     Studies/Results: Dg Chest Port 1 View  07/07/2015  CLINICAL DATA:  Acute respiratory failure, ischemic cardiomyopathy, diabetes EXAM: PORTABLE CHEST 1 VIEW COMPARISON:  Portable chest x-ray of July 10, 2015. FINDINGS: The lungs are borderline hypoinflated. The interstitial markings have improved slightly. There remain confluent alveolar opacities in both  lungs. The left hemidiaphragm it is partially obscured. The heart remains enlarged. The permanent pacemaker defibrillator is in stable position. There is very high positioning of the endotracheal tube approximately 3.1 cm above the superior margin of the clavicular heads which is almost 10 cm above the carina. The PICC line tip projects over the midportion of the SVC. The esophagogastric tube tip projects below the inferior margin of the image. IMPRESSION: 1. High positioning of the endotracheal tube. Advancement by at least 5 cm is needed. Critical Value/emergent results were called by telephone at the time of interpretation on 06/29/2015 at 7:14 am to Lesli Albee, RN, who verbally acknowledged these results. 2. Mild improvement in alveolar opacities bilaterally. Stable cardiomegaly with mild pulmonary vascular congestion. Electronically Signed   By: David  Martinique M.D.   On: 07/18/2015 07:16   Dg Chest Port 1 View  07/10/2015  CLINICAL DATA:  Acute respiratory failure, cardiomyopathy and coronary artery and valvular heart disease EXAM: PORTABLE CHEST 1 VIEW COMPARISON:  Portable chest x-ray of July 09, 2015 FINDINGS: The lungs are slightly better inflated today. There are increased interstitial opacities bilaterally however. There is pleural fluid on the left blunting the lateral costophrenic angle. The cardiac silhouette remains enlarged. The pulmonary vascularity remains engorged. The permanent pacemaker/defibrillator is in stable position. The endotracheal tube tip lies approximately 3.8 cm above the carina. The esophagogastric tube tip projects below the  inferior margin of the image. IMPRESSION: CHF with pulmonary interstitial edema and small left pleural effusion. There has been mild interval deterioration in the appearance of the pulmonary interstitium since the previous study. The support apparatus are in stable position. Electronically Signed   By: David  Martinique M.D.   On: 07/10/2015 07:20     Impression/Plan: 65 yo with onset of melena and blood from NGT concerning for a peptic ulcer bleed although it could be residual blood from recent pulmonary hemorrhage. Bedside EGD now. Protonix drip. Agree with transfusion. Supportive care.    LOS: 14 days   [place] C.  07/13/2015, 9:20 PM  Pager [phone]  If no answer or after 5 PM call [phone]

## 2015-07-11 NOTE — Progress Notes (Signed)
Pt has had 3 very lg melena BM's as of 1600. Lg amounts of blood in pt stool. MD/NP aware. RN will continue to monitor.

## 2015-07-12 ENCOUNTER — Encounter (HOSPITAL_COMMUNITY): Payer: Self-pay | Admitting: Gastroenterology

## 2015-07-12 ENCOUNTER — Inpatient Hospital Stay (HOSPITAL_COMMUNITY): Payer: Medicare Other

## 2015-07-12 DIAGNOSIS — N179 Acute kidney failure, unspecified: Secondary | ICD-10-CM | POA: Insufficient documentation

## 2015-07-12 LAB — PROTIME-INR
INR: 1.36 (ref 0.00–1.49)
Prothrombin Time: 16.9 seconds — ABNORMAL HIGH (ref 11.6–15.2)

## 2015-07-12 LAB — CARBOXYHEMOGLOBIN
CARBOXYHEMOGLOBIN: 2.7 % — AB (ref 0.5–1.5)
METHEMOGLOBIN: 0.6 % (ref 0.0–1.5)
O2 Saturation: 66.6 %
TOTAL HEMOGLOBIN: 17.2 g/dL — AB (ref 12.0–16.0)

## 2015-07-12 LAB — CBC WITH DIFFERENTIAL/PLATELET
BASOS ABS: 0.1 10*3/uL (ref 0.0–0.1)
BASOS PCT: 1 %
EOS ABS: 0 10*3/uL (ref 0.0–0.7)
EOS PCT: 0 %
HCT: 26.1 % — ABNORMAL LOW (ref 36.0–46.0)
Hemoglobin: 8.7 g/dL — ABNORMAL LOW (ref 12.0–15.0)
LYMPHS PCT: 12 %
Lymphs Abs: 1.2 10*3/uL (ref 0.7–4.0)
MCH: 29.4 pg (ref 26.0–34.0)
MCHC: 33.3 g/dL (ref 30.0–36.0)
MCV: 88.2 fL (ref 78.0–100.0)
MONO ABS: 0.6 10*3/uL (ref 0.1–1.0)
Monocytes Relative: 6 %
Neutro Abs: 8.3 10*3/uL — ABNORMAL HIGH (ref 1.7–7.7)
Neutrophils Relative %: 82 %
PLATELETS: 147 10*3/uL — AB (ref 150–400)
RBC: 2.96 MIL/uL — AB (ref 3.87–5.11)
RDW: 17.7 % — AB (ref 11.5–15.5)
WBC: 10.1 10*3/uL (ref 4.0–10.5)

## 2015-07-12 LAB — CBC
HEMATOCRIT: 32.8 % — AB (ref 36.0–46.0)
HEMATOCRIT: 31.8 % — AB (ref 36.0–46.0)
HEMOGLOBIN: 10.6 g/dL — AB (ref 12.0–15.0)
HEMOGLOBIN: 10.4 g/dL — AB (ref 12.0–15.0)
MCH: 27.9 pg (ref 26.0–34.0)
MCH: 28.6 pg (ref 26.0–34.0)
MCHC: 32.3 g/dL (ref 30.0–36.0)
MCHC: 32.7 g/dL (ref 30.0–36.0)
MCV: 86.3 fL (ref 78.0–100.0)
MCV: 87.4 fL (ref 78.0–100.0)
PLATELETS: 185 10*3/uL (ref 150–400)
Platelets: 177 10*3/uL (ref 150–400)
RBC: 3.8 MIL/uL — AB (ref 3.87–5.11)
RBC: 3.64 MIL/uL — ABNORMAL LOW (ref 3.87–5.11)
RDW: 17.9 % — ABNORMAL HIGH (ref 11.5–15.5)
RDW: 18.6 % — AB (ref 11.5–15.5)
WBC: 14 10*3/uL — AB (ref 4.0–10.5)
WBC: 12.6 10*3/uL — ABNORMAL HIGH (ref 4.0–10.5)

## 2015-07-12 LAB — PREPARE FRESH FROZEN PLASMA
UNIT DIVISION: 0
UNIT DIVISION: 0
Unit division: 0

## 2015-07-12 LAB — URINE CULTURE: Culture: >=100000 — AB

## 2015-07-12 LAB — BASIC METABOLIC PANEL
Anion gap: 11 (ref 5–15)
BUN: 65 mg/dL — AB (ref 6–20)
CALCIUM: 9.2 mg/dL (ref 8.9–10.3)
CO2: 28 mmol/L (ref 22–32)
Chloride: 109 mmol/L (ref 101–111)
Creatinine, Ser: 2.89 mg/dL — ABNORMAL HIGH (ref 0.44–1.00)
GFR calc Af Amer: 19 mL/min — ABNORMAL LOW (ref >60)
GFR, EST NON AFRICAN AMERICAN: 16 mL/min — AB (ref >60)
Glucose, Bld: 122 mg/dL — ABNORMAL HIGH (ref 65–99)
Potassium: 3.9 mmol/L (ref 3.5–5.1)
SODIUM: 148 mmol/L — AB (ref 135–145)

## 2015-07-12 LAB — CULTURE, RESPIRATORY W GRAM STAIN: Special Requests: NORMAL

## 2015-07-12 LAB — GLUCOSE, CAPILLARY
GLUCOSE-CAPILLARY: 105 mg/dL — AB (ref 65–99)
GLUCOSE-CAPILLARY: 141 mg/dL — AB (ref 65–99)
GLUCOSE-CAPILLARY: 161 mg/dL — AB (ref 65–99)
Glucose-Capillary: 107 mg/dL — ABNORMAL HIGH (ref 65–99)
Glucose-Capillary: 145 mg/dL — ABNORMAL HIGH (ref 65–99)

## 2015-07-12 MED ORDER — METOPROLOL TARTRATE 1 MG/ML IV SOLN
5.0000 mg | INTRAVENOUS | Status: AC | PRN
  Administered 2015-07-14 – 2015-07-15 (×2): 5 mg via INTRAVENOUS
  Filled 2015-07-12 (×2): qty 5

## 2015-07-12 MED ORDER — SUCRALFATE 1 GM/10ML PO SUSP
1.0000 g | Freq: Three times a day (TID) | ORAL | Status: DC
  Administered 2015-07-12 – 2015-07-17 (×18): 1 g via ORAL
  Filled 2015-07-12 (×22): qty 10

## 2015-07-12 MED ORDER — METOPROLOL TARTRATE 1 MG/ML IV SOLN
5.0000 mg | Freq: Once | INTRAVENOUS | Status: AC
  Administered 2015-07-12: 5 mg via INTRAVENOUS
  Filled 2015-07-12: qty 5

## 2015-07-12 MED ORDER — HYDRALAZINE HCL 20 MG/ML IJ SOLN
10.0000 mg | INTRAMUSCULAR | Status: DC | PRN
  Administered 2015-07-13 – 2015-07-14 (×2): 10 mg via INTRAVENOUS
  Filled 2015-07-12 (×4): qty 1

## 2015-07-12 MED ORDER — FUROSEMIDE 10 MG/ML IJ SOLN
80.0000 mg | Freq: Once | INTRAMUSCULAR | Status: AC
  Administered 2015-07-12: 80 mg via INTRAVENOUS
  Filled 2015-07-12: qty 8

## 2015-07-12 NOTE — Progress Notes (Signed)
eLink Physician-Brief Progress Note Patient Name: [name] Terry DOB: 13-Jul-1950 MRN: [id number]   Date of Service  07/12/2015  HPI/Events of Note  Hypertension - BP = 195/98.   eICU Interventions  Will order: 1. Lasix 80 mg IV now.  2. Hydralazine 10 mg IV Q 4 hours PRN SBP > 160.     Intervention Category Major Interventions: Hypertension - evaluation and management  Jasmia Angst Eugene 07/12/2015, 10:13 PM

## 2015-07-12 NOTE — Progress Notes (Signed)
GASTROENTEROLOGY PROGRESS NOTE  Problem:   Acute UGIB from ?DU's  Subjective: Pt sedated on ventilator.  Objective: INR corrected to 1.36 following FFP yesterday and today.  Hgb 10.6 (up from 8.7) following 6 units prc's in past 72hrs  Oral-gastric tube has dark fluid c/w old blood  VS ok--104/68, HR 64   Assessment: Probably quiescent UGIB w/ severe acute posthemorrhagic anemia.  Plan: 1.  Will add sucralfate suspension per NG 2.  If destabilizing rebleed occurs, consider IR embolization; if more modest rebleed occurs, could consider attempt at repeat egd at bedside. 3   Would continue IV Protonix infusion for at least 24 hrs following last evidence of rebleed; would then use b.i.d. dosing until dischg, then qd should be sufficient. 4.  Would continue sucralfate until dischg--then may d/c it 5.  Ideally, would go 7-14 days w/out further anticoagulation to allow for mucosal healing. 6.  Will sign off--call if questions.  Cleotis Nipper, M.D. 07/12/2015 2:26 PM  Pager [phone] If no answer or after 5 PM call [phone]

## 2015-07-12 NOTE — Progress Notes (Signed)
Advanced Heart Failure Rounding Note  PCP: Karna Dupes, PA-C Primary Cardiologist: Johnsie Cancel EP: Allred  Subjective:   This is a 65 year old female patient with h/o of CAD, HTN, ICM s/p MDT dual chamber ICD 2013, lymphoma s/p chemo/radiation 6962, chronic systolic CHF, status post VT ablation 2, HTN, HLD, VT, DM, CKD stage III, PMR and depression who presented to [facility] with CP and SOB  Started on empiric milrinone 07/02/15 with presentation concerning for low output.  PICC placed. On 4/11 developed respiratory distress requiring intubation and bronchoscopy. After intubation she was hypotensive and started on dopamine and continued on milrinone. Unfortunately later that evening she had V fib arrest multiple shocks. Milrinone and dopamine stopped. Started on lidocaine and amio drip.   Over weekend had pulmonary hemorrhage. Yesterday GI bleed requiring multiple blood products. Had EGD with duodenal ulcers with visible vessel. Unable to treat fully. On protonix. Hgb today 8.7. Ongoing melena.   Opens eyes. Renal function better. CVP 13-14. Co-ox 67%. CXR worse    Objective:   Weight Range: 194 lb 7.1 oz (88.2 kg) Body mass index is 32.36 kg/(m^2).   Vital Signs:   Temp:  [94.4 F (34.7 C)-99.8 F (37.7 C)] 97.7 F (36.5 C) (04/19 0634) Pulse Rate:  [59-142] 59 (04/19 0514) Resp:  [0-24] 15 (04/19 0600) BP: (67-200)/(48-113) 129/95 mmHg (04/19 0634) SpO2:  [96 %-100 %] 100 % (04/19 0634) FiO2 (%):  [40 %-100 %] 40 % (04/19 0634) Weight:  [194 lb 7.1 oz (88.2 kg)] 194 lb 7.1 oz (88.2 kg) (04/19 0500) Last BM Date: 07/12/15  Weight change: Filed Weights   07/10/15 0500 07/08/2015 0342 07/12/15 0500  Weight: 193 lb 5.5 oz (87.7 kg) 189 lb 2.5 oz (85.8 kg) 194 lb 7.1 oz (88.2 kg)    Intake/Output:   Intake/Output Summary (Last 24 hours) at 07/12/15 0738 Last data filed at 07/12/15 0634  Gross per 24 hour  Intake 4156.01 ml  Output   1800 ml  Net 2356.01 ml     Physical  Exam: CVP 13-14 General: Intubated. Awake. Agitated HEENT: ETT blood noted in tube. NG with coffee ground liquid.  Neck: supple. Carotids 2+ bilat; no bruits. No thyromegaly or nodule noted. Cor: PMI nondisplaced. RRR. No rubs or gallops. MR murmur Lungs: Coarse throughout Abdomen: soft, NT, ND, no HSM. No bruits or masses. +BS  Extremities: no cyanosis, clubbing, rash, edema. R and LLE cold Neuro: Intubated agitated. Not following commands.  GU: Foley.   Telemetry: Sinus Tach 110s   Labs: CBC  Recent Labs  06/28/2015 0400 07/15/2015 1847 07/12/15 0450  WBC 9.6 6.8 10.1  NEUTROABS 6.5  --  8.3*  HGB 7.3* 5.2* 8.7*  HCT 23.5* 16.2* 26.1*  MCV 94.4 91.0 88.2  PLT 204 118* 952*   Basic Metabolic Panel  Recent Labs  06/30/2015 0400 07/12/15 0450  NA 148* 148*  K 4.2 3.9  CL 107 109  CO2 29 28  GLUCOSE 162* 122*  BUN 60* 65*  CREATININE 3.00* 2.89*  CALCIUM 9.2 9.2   Liver Function Tests No results for input(s): AST, ALT, ALKPHOS, BILITOT, PROT, ALBUMIN in the last 72 hours. No results for input(s): LIPASE, AMYLASE in the last 72 hours. Cardiac Enzymes No results for input(s): CKTOTAL, CKMB, CKMBINDEX, TROPONINI in the last 72 hours.  BNP: BNP (last 3 results)  Recent Labs  02/21/15 1042 03/01/15 0839 04/13/15 2215  BNP 386.1* 452.0* 894.0*    ProBNP (last 3 results) No results for input(s):  PROBNP in the last 8760 hours.   D-Dimer No results for input(s): DDIMER in the last 72 hours. Hemoglobin A1C No results for input(s): HGBA1C in the last 72 hours. Fasting Lipid Panel No results for input(s): CHOL, HDL, LDLCALC, TRIG, CHOLHDL, LDLDIRECT in the last 72 hours. Thyroid Function Tests No results for input(s): TSH, T4TOTAL, T3FREE, THYROIDAB in the last 72 hours.  Invalid input(s): FREET3  Other results:     Imaging/Studies:  Dg Chest Port 1 View  07/12/2015  CLINICAL DATA:  Acute respiratory failure with hypoxemia, shortness of breath. EXAM:  PORTABLE CHEST 1 VIEW COMPARISON:  06/25/2015 and CT chest 04/14/2015. FINDINGS: Endotracheal tube terminates approximately 4.8 cm above the carina. Nasogastric tube is followed into the stomach. Left subclavian pacemaker and ICD lead tips are stable in position. Right PICC tip projects over the SVC. Cardiomediastinal silhouette is enlarged, stable. There is moderate diffuse bilateral airspace opacification with subtotal collapse/ consolidation in the left lower lobe. Small left pleural effusion. IMPRESSION: 1. Moderate diffuse bilateral patchy airspace opacification may be due to edema or pneumonia. 2. Subtotal left lower lobe collapse/consolidation. 3. Small left pleural effusion. Electronically Signed   By: Lorin Picket M.D.   On: 07/12/2015 07:14   Dg Chest Port 1 View  07/16/2015  CLINICAL DATA:  Acute respiratory failure, ischemic cardiomyopathy, diabetes EXAM: PORTABLE CHEST 1 VIEW COMPARISON:  Portable chest x-ray of July 10, 2015. FINDINGS: The lungs are borderline hypoinflated. The interstitial markings have improved slightly. There remain confluent alveolar opacities in both lungs. The left hemidiaphragm it is partially obscured. The heart remains enlarged. The permanent pacemaker defibrillator is in stable position. There is very high positioning of the endotracheal tube approximately 3.1 cm above the superior margin of the clavicular heads which is almost 10 cm above the carina. The PICC line tip projects over the midportion of the SVC. The esophagogastric tube tip projects below the inferior margin of the image. IMPRESSION: 1. High positioning of the endotracheal tube. Advancement by at least 5 cm is needed. Critical Value/emergent results were called by telephone at the time of interpretation on 07/14/2015 at 7:14 am to Lesli Albee, RN, who verbally acknowledged these results. 2. Mild improvement in alveolar opacities bilaterally. Stable cardiomegaly with mild pulmonary vascular congestion.  Electronically Signed   By: David  Martinique M.D.   On: 06/28/2015 07:16    Latest Echo  Latest Cath   Medications:     Scheduled Medications: . sodium chloride   Intravenous Once  . sodium chloride   Intravenous Once  . allopurinol  100 mg Per Tube Daily  . antiseptic oral rinse  7 mL Mouth Rinse 10 times per day  . aspirin  81 mg Per Tube Daily  . chlorhexidine gluconate (SAGE KIT)  15 mL Mouth Rinse BID  . colchicine  0.3 mg Oral Daily  . feeding supplement (PRO-STAT SUGAR FREE 64)  30 mL Per Tube BID  . feeding supplement (VITAL HIGH PROTEIN)  1,000 mL Per Tube Q24H  . ferrous sulfate  300 mg Per Tube BID WC  . furosemide  80 mg Intravenous BID  . insulin aspart  0-9 Units Subcutaneous 6 times per day  . levothyroxine  100 mcg Per Tube QAC breakfast  . PARoxetine  10 mg Oral Daily  . potassium chloride  40 mEq Oral BID  . QUEtiapine  25 mg Oral QHS  . sodium chloride flush  10-40 mL Intracatheter Q12H    Infusions: . sodium chloride Stopped (07/09/15  1329)  . amiodarone 30 mg/hr (07/04/2015 1900)  . fentaNYL infusion INTRAVENOUS 300 mcg/hr (07/12/15 0311)  . norepinephrine (LEVOPHED) Adult infusion Stopped (06/25/2015 1230)  . pantoprozole (PROTONIX) infusion 8 mg/hr (07/12/15 5797)    PRN Medications: [DISCONTINUED] acetaminophen **OR** acetaminophen, acetaminophen, albuterol, diphenhydrAMINE **OR** diphenhydrAMINE, fentaNYL, metoprolol, midazolam, nitroGLYCERIN, ondansetron (ZOFRAN) IV, ondansetron **OR** [DISCONTINUED] ondansetron (ZOFRAN) IV, promethazine, sodium chloride flush   Assessment   1. Respiratory Arrest 2. VF Arrest 3.Acute on chronic combined CHF - EF 20-25% w/ diffuse HK and akinesis of the inferolateral and inferior myocardium, G2DD, severe MR, mild RV dysfunction/dilation, + atrial septal anuerysm 4. Severe MR - by TEE 5.  Severe Pulm HTN - PA pressure 90 mm Hg 6.  PAF 7. AKI on CKD stage IV  8. Chronic anemia - workup in 03/2014 unremarkable GI  bleed. 9. DM2 10. HLD 11. Hypokalemia 12. LCB-  No external shocks. [place] for meds and drugs.  13. Hemoptysis/pulmonary hemorrhage 14.  GI bleed .      Plan    Remains intubated.   Received multiple blood products for GI bleed. Had EGD with duodenal ulcers noted. On protonix. Ongoing melena over night.   Respiratory Culture- yeast. Discussed ID pharmacy. No role antifungal likely colonization.   Co-ox 66% . No further VT. Continue amio.   Renal function down a little.  CVP 13-14 Continue IV lasix.     INR 1.36.    Length of Stay: Wright NP-C  07/12/2015, 7:38 AM  Advanced Heart Failure Team Pager [phone] (M-F; 7a - 4p)  Please contact [facility] for night-coverage after hours (4p -7a ) and weekends on amion  Patient seen and examined with Darrick Grinder, NP. We discussed all aspects of the encounter. I agree with the assessment and plan as stated above.   GI evaluation reviewed. Patient with ongoing GI bleeding. Holding anticoagulation. Continue IV PPI. Will discuss with GI and CCM. Transfuse as needed.   Volume status back up. CXR slightly worse. Co-ox ok. Will continue IV lasix. Continue amio to suppress VT.   Prognosis remains very tenuous.   The patient is critically ill with multiple organ systems failure and requires high complexity decision making for assessment and support, frequent evaluation and titration of therapies, application of advanced monitoring technologies and extensive interpretation of multiple databases.   Critical Care Time devoted to patient care services described in this note is 35 Minutes.  Jazzmyn Filion,MD 9:12 AM

## 2015-07-12 NOTE — Progress Notes (Signed)
PULMONARY / CRITICAL CARE MEDICINE   Name: [name] Terry MRN: [id number] DOB: [birth year]    ADMISSION DATE:  07/16/2015 CONSULTATION DATE:  07/04/15  REFERRING MD:  Fanny Bien  CHIEF COMPLAINT:  Respiratory arrest  HISTORY OF PRESENT ILLNESS:    Ary Helman is 65 yo female with past medical history significant for ischemic cardiomyopathy, stroke, coronary artery disease, lymphoma, CHF  S/p ICD placement ,HTN,HLD, Venticular tachycardia,chronic kidney disease -stage 3, depression, GERD.  Patient presented to ED with chest pain and LHC was not indicative of PCI at the time and was medically managed.  Started on empiric milrinone 07/02/15 with presentation concerning for low output On 4/11 developed  respiratory arrest.  Patient never lost her pulse and was intubated , now on vent. Developed VF arrest 4/11 evening requiring multiple shocks  SUBJECTIVE:  No further bleeding noted per ET and NGT.  Sedated. Comfortable.   VITAL SIGNS: BP 129/95 mmHg  Pulse 84  Temp(Src) 97.7 F (36.5 C) (Oral)  Resp 17  Ht 5\' 5"  (1.651 m)  Wt 194 lb 7.1 oz (88.2 kg)  BMI 32.36 kg/m2  SpO2 100%  HEMODYNAMICS: CVP:  [13 mmHg-70 mmHg] 13 mmHg  VENTILATOR SETTINGS: Vent Mode:  [-] PRVC FiO2 (%):  [40 %-100 %] 40 % Set Rate:  [12 bmp] 12 bmp Vt Set:  [500 mL] 500 mL PEEP:  [5 cmH20] 5 cmH20 Plateau Pressure:  [20 cmH20-25 cmH20] 20 cmH20  INTAKE / OUTPUT: I/O last 3 completed shifts: In: 5076.4 [I.V.:1963.4; TB:5880010; NG/GT:300; IV Piggyback:150] Out: 2450 [Urine:1650; Emesis/NG output:800]  PHYSICAL EXAMINATION:  Gen: sedated on vent, arouses to voice, doesn't follow commands HENT: NCAT ETT in place PULM: fair ae. Bibasilar crackles CV: RRR on my exam, gallop noted, systolic murmur GI: BS+, soft, nontender MSK: normal bulk and tone. Gr 2 edema Neuro: sedated on vent, arouses to voice, doesn't follow commands  LABS:  BMET  Recent Labs Lab 07/10/15 0440 06/26/2015 0400 07/12/15 0450   NA 144 148* 148*  K 4.2 4.2 3.9  CL 104 107 109  CO2 29 29 28   BUN 48* 60* 65*  CREATININE 2.62* 3.00* 2.89*  GLUCOSE 135* 162* 122*    Electrolytes  Recent Labs Lab 07/06/15 0400 07/07/15 0255  07/10/15 0440 07/10/2015 0400 07/12/15 0450  CALCIUM 8.3* 8.3*  < > 8.8* 9.2 9.2  MG 1.9 2.2  --   --   --   --   PHOS 4.0 4.3  --   --   --   --   < > = values in this interval not displayed.  CBC  Recent Labs Lab 06/30/2015 0400 06/29/2015 1847 07/12/15 0450  WBC 9.6 6.8 10.1  HGB 7.3* 5.2* 8.7*  HCT 23.5* 16.2* 26.1*  PLT 204 118* 147*    Coag's  Recent Labs Lab 07/18/2015 0400 07/10/2015 1848 07/12/15 0450  APTT  --  39*  --   INR 3.20* 1.76* 1.36    Sepsis Markers No results for input(s): LATICACIDVEN, PROCALCITON, O2SATVEN in the last 168 hours.  ABG  Recent Labs Lab 07/06/15 0527 07/07/15 0423 07/10/15 1020  PHART 7.369 7.387 7.404  PCO2ART 45.0 44.0 46.8*  PO2ART 71.0* 129* 190*    Liver Enzymes  Recent Labs Lab 07/08/15 0425  AST 92*  ALT 174*  ALKPHOS 62  BILITOT 0.8  ALBUMIN 1.9*    Cardiac Enzymes No results for input(s): TROPONINI, PROBNP in the last 168 hours.  Glucose  Recent Labs Lab 06/29/2015 0736 06/28/2015  1122 07/03/2015 1555 07/18/2015 1933 07/12/15 0056 07/12/15 0328  GLUCAP 142* 148* 132* 144* 141* 105*    Imaging Dg Chest Port 1 View  07/12/2015  CLINICAL DATA:  Acute respiratory failure with hypoxemia, shortness of breath. EXAM: PORTABLE CHEST 1 VIEW COMPARISON:  07/12/2015 and CT chest 04/14/2015. FINDINGS: Endotracheal tube terminates approximately 4.8 cm above the carina. Nasogastric tube is followed into the stomach. Left subclavian pacemaker and ICD lead tips are stable in position. Right PICC tip projects over the SVC. Cardiomediastinal silhouette is enlarged, stable. There is moderate diffuse bilateral airspace opacification with subtotal collapse/ consolidation in the left lower lobe. Small left pleural effusion.  IMPRESSION: 1. Moderate diffuse bilateral patchy airspace opacification may be due to edema or pneumonia. 2. Subtotal left lower lobe collapse/consolidation. 3. Small left pleural effusion. Electronically Signed   By: Lorin Picket M.D.   On: 07/12/2015 07:14     STUDIES:  4/6 TEE 4/7 RHC And Coronary angiography Distal LAD , 30% stenosed, proximal Circumflex lesion 405 stenosed, proximal circumflex to mid circumflex 50% stenosed,1st Mrg lesion, 75% stenosed,1st RPLB lesion, 99% stenosed. 4/11 Bronchoscopy per Dr. Nelda Marseille "right mainstem hemorrhage" 4/18 Bronchoscopy > old blood in RLL  CULTURES: 4/11 BC >> (-) 4/11 sputum>> nml flora 4/11 urine>> e coli  4/17 resp > candida albicans  ANTIBIOTICS:  4/11 zosyn>> d/c  SIGNIFICANT EVENTS:  LINES/TUBES: PICC 4/8>>> 4/11 ET>>>  DISCUSSION: 64 YO female with past medical Hx of  ischemic cardiomyopathy, stroke, coronary artery disease, lymphoma, CHF  S/p ICD placement ,HTN,HLD, Venticular tachycardia,chronic kidney disease -stage 3, depression, GERD, NSTEMI, had repiratory arrested,intubated and currently on vent.  Remains in renal failure and had hemoptysis overnight on 4/17 in setting of worsening hemoptysis.  Was noted to have bleeding on bronchoscopy on 4/11, recurrent in setting of elevated INR 4/17.  ASSESSMENT / PLAN:  PULMONARY A: Acute Hypoxemic respiratory failure Bit tongue > better Pulmonary edema Possible LLung HCAP  Hemoptysis 4/16 in setting of elevated INR > resolved, uncertain etiology P:   Continue diuresis per cardiology Wean PEEP/FiO2 today Duoneb/albuterol Zosyn has been dcd > will observe.   CARDIOVASCULAR A:  NSTEMI Chronic systolic CHF-EF 123456, severe MR Hx of CAD, S/P stenting PAF P:  Continue amiodarone per cardiology Tele Cont diuresis  RENAL A:   Chronic kidney disease -stage 3 Hyponatremia -resolved  P:   Monitor BMET and UOP Replace electrolytes as needed Lasix per  cardiology  GASTROINTESTINAL A:  Diarrhea C-DIFF- Antigen+, toxin -negative -repeat same GERD Hematochezia in setting of elevated INR UGIB  P:  TF per nutrition  Protonix to continue See Heme Adjust OG tube (coiled in throat on CXR)  HEMATOLOGIC A:   Coagulopathy > due to warfarin Recent UGIB > EGD 4/18 old blood in ulcerated duodenal bulb P:  SCD FFP/Vit K now Repeat CBC later today;  No active bleeding Hb and Hct holding.; transfuse for Hb < 7.  Hold oral coumadin  INFECTIOUS A:   Aspiration PNA E coli UTI C diff colonizer P:   Zosyn has been dc'd.  Repeat resp culture given worsening CXR findings Low threshold for starting abx.   ENDOCRINE A:   DM Hypothyroidism P:   SSI coverage Levothyroxine -via tube  NEUROLOGIC A:   Hx OF Depression/ anxiety P:   RASS goal -1 Hold paxil/ ct seroquel Continue fentanyl gtt per PAD protocol  FAMILY  - Updates: sister and daughter updated at bedside.   - Inter-disciplinary family meet or Palliative Care  meeting due by: 07/10/15    Critical Care time with this patient today : 35 minutes.  Monica Becton, MD 07/12/2015, 10:32 AM [facility] Pager [phone] After 3 pm or if no answer, call [phone]

## 2015-07-13 DIAGNOSIS — I480 Paroxysmal atrial fibrillation: Secondary | ICD-10-CM

## 2015-07-13 LAB — TYPE AND SCREEN
ABO/RH(D): A POS
ANTIBODY SCREEN: NEGATIVE
UNIT DIVISION: 0
UNIT DIVISION: 0
UNIT DIVISION: 0
UNIT DIVISION: 0
Unit division: 0
Unit division: 0

## 2015-07-13 LAB — GLUCOSE, CAPILLARY
GLUCOSE-CAPILLARY: 148 mg/dL — AB (ref 65–99)
GLUCOSE-CAPILLARY: 122 mg/dL — AB (ref 65–99)
GLUCOSE-CAPILLARY: 132 mg/dL — AB (ref 65–99)
GLUCOSE-CAPILLARY: 129 mg/dL — AB (ref 65–99)
Glucose-Capillary: 117 mg/dL — ABNORMAL HIGH (ref 65–99)
Glucose-Capillary: 139 mg/dL — ABNORMAL HIGH (ref 65–99)
Glucose-Capillary: 141 mg/dL — ABNORMAL HIGH (ref 65–99)
Glucose-Capillary: 144 mg/dL — ABNORMAL HIGH (ref 65–99)
Glucose-Capillary: 149 mg/dL — ABNORMAL HIGH (ref 65–99)

## 2015-07-13 LAB — PROTIME-INR
INR: 1.54 — AB (ref 0.00–1.49)
PROTHROMBIN TIME: 18.5 s — AB (ref 11.6–15.2)

## 2015-07-13 LAB — CBC
HCT: 30.6 % — ABNORMAL LOW (ref 36.0–46.0)
HCT: 32.8 % — ABNORMAL LOW (ref 36.0–46.0)
Hemoglobin: 9.7 g/dL — ABNORMAL LOW (ref 12.0–15.0)
Hemoglobin: 10.4 g/dL — ABNORMAL LOW (ref 12.0–15.0)
MCH: 28.3 pg (ref 26.0–34.0)
MCH: 28.9 pg (ref 26.0–34.0)
MCHC: 31.7 g/dL (ref 30.0–36.0)
MCHC: 31.7 g/dL (ref 30.0–36.0)
MCV: 89.2 fL (ref 78.0–100.0)
MCV: 91.1 fL (ref 78.0–100.0)
PLATELETS: 177 10*3/uL (ref 150–400)
PLATELETS: 207 10*3/uL (ref 150–400)
RBC: 3.43 MIL/uL — ABNORMAL LOW (ref 3.87–5.11)
RBC: 3.6 MIL/uL — AB (ref 3.87–5.11)
RDW: 19.7 % — AB (ref 11.5–15.5)
RDW: 20.1 % — ABNORMAL HIGH (ref 11.5–15.5)
WBC: 9.2 10*3/uL (ref 4.0–10.5)
WBC: 14 10*3/uL — AB (ref 4.0–10.5)

## 2015-07-13 LAB — BASIC METABOLIC PANEL
Anion gap: 12 (ref 5–15)
BUN: 74 mg/dL — ABNORMAL HIGH (ref 6–20)
CALCIUM: 9.1 mg/dL (ref 8.9–10.3)
CO2: 27 mmol/L (ref 22–32)
CREATININE: 3.29 mg/dL — AB (ref 0.44–1.00)
Chloride: 113 mmol/L — ABNORMAL HIGH (ref 101–111)
GFR calc Af Amer: 16 mL/min — ABNORMAL LOW (ref >60)
GFR, EST NON AFRICAN AMERICAN: 14 mL/min — AB (ref >60)
GLUCOSE: 149 mg/dL — AB (ref 65–99)
Potassium: 4.5 mmol/L (ref 3.5–5.1)
Sodium: 152 mmol/L — ABNORMAL HIGH (ref 135–145)

## 2015-07-13 LAB — PREPARE FRESH FROZEN PLASMA
UNIT DIVISION: 0
Unit division: 0

## 2015-07-13 LAB — CARBOXYHEMOGLOBIN
CARBOXYHEMOGLOBIN: 2.9 % — AB (ref 0.5–1.5)
METHEMOGLOBIN: 0.8 % (ref 0.0–1.5)
O2 SAT: 59 %
Total hemoglobin: 10.3 g/dL — ABNORMAL LOW (ref 12.0–16.0)

## 2015-07-13 MED ORDER — AMIODARONE HCL IN DEXTROSE 360-4.14 MG/200ML-% IV SOLN
30.0000 mg/h | INTRAVENOUS | Status: DC
  Administered 2015-07-13 (×2): 60 mg/h via INTRAVENOUS
  Administered 2015-07-13 – 2015-07-17 (×8): 30 mg/h via INTRAVENOUS
  Filled 2015-07-13 (×10): qty 200

## 2015-07-13 MED ORDER — AMIODARONE LOAD VIA INFUSION
150.0000 mg | Freq: Once | INTRAVENOUS | Status: AC
  Administered 2015-07-13: 150 mg via INTRAVENOUS
  Filled 2015-07-13: qty 83.34

## 2015-07-13 NOTE — Progress Notes (Signed)
Pt had GI event, large amount of blood came "pouring" out of rectum, no stool noted. ELink MD and GI on-call MD notified. CBC sent to lab, VSS. Will continue to monitor.

## 2015-07-13 NOTE — Progress Notes (Signed)
Notified by nurse of passage of large amount of blood per rectum last night and again this morning.  She states that last night, it was partially red in color; this morning, it was melenic.  --No acute hemodynamic instability with these events  --No signif drop in hgb overnight  --No fresh bld per O-G tube (have asked nurse to try lavaging it as added confirmation)  --No signif rise in BUN (just mild upward drift, c/w rising creatinine).  IMPR:  My best judgement is that this is delayed passage of old blood, or perhaps some gradual ongoing oozing, rather than acute rebleeding.    RECOMM:    I would favor continued observation.    I don't think there is sufficient mandate to due IR embolization, which, with her azotemia, would place her at high risk for kidney injury and need for dialysis.    Concerning EGD, based on prior egd findings, this ulcer does not seem to be amenable to optimal endoscopic therapy because of its location, so doing egd might provide some clearer understanding of the current status of the ulcer, but would not likely change the clinical course.    I can go either way on egd--she is obviously not an optimal candidate for procedures, but I think the procedure would be reasonably safe, with the patient intubated and currently hemodynamically stable.  Therefore, I will be on standby and would be happy to discuss w/ PCCM attending.  My default position will be NOT to scope (for reasons above) unless/until requested to do so by PCCM.  Cleotis Nipper, M.D. Pager [phone] If no answer or after 5 PM call [phone]

## 2015-07-13 NOTE — Progress Notes (Signed)
PULMONARY / CRITICAL CARE MEDICINE   Name: [name] Terry MRN: [id number] DOB: 10-24-1950    ADMISSION DATE:  07/12/2015 CONSULTATION DATE:  07/04/15  REFERRING MD:  Fanny Bien  CHIEF COMPLAINT:  Respiratory arrest  HISTORY OF PRESENT ILLNESS:   [name] Terry is 65 yo female with past medical history significant for ischemic cardiomyopathy, stroke, coronary artery disease, lymphoma, CHF  S/p ICD placement ,HTN,HLD, Venticular tachycardia,chronic kidney disease -stage 3, depression, GERD.  Patient presented to ED with chest pain and LHC was not indicative of PCI at the time and was medically managed.  Started on empiric milrinone 07/02/15 with presentation concerning for low output On 4/11 developed  respiratory arrest.  Patient never lost her pulse and was intubated , now on vent. Developed VF arrest 4/11 evening requiring multiple shocks  SUBJECTIVE:  Failed PST this am > was tachypneic, tachycardic. Was in rapid afib this am > converted to SR with amio bolus. Had maroon colored stools 4/19 and melena today.  BP stable. HTN actually.   VITAL SIGNS: BP 138/101 mmHg  Pulse 126  Temp(Src) 97.2 F (36.2 C) (Axillary)  Resp 14  Ht 5\' 5"  (1.651 m)  Wt 194 lb 7.1 oz (88.2 kg)  BMI 32.36 kg/m2  SpO2 92%  HEMODYNAMICS: CVP:  [13 mmHg] 13 mmHg  VENTILATOR SETTINGS: Vent Mode:  [-] PRVC FiO2 (%):  [40 %] 40 % Set Rate:  [12 bmp] 12 bmp Vt Set:  [500 mL] 500 mL PEEP:  [5 cmH20] 5 cmH20 Plateau Pressure:  [22 cmH20-26 cmH20] 26 cmH20  INTAKE / OUTPUT: I/O last 3 completed shifts: In: 3966 [I.V.:2445; Blood:1521] Out: [id number] [Urine:1170; Emesis/NG output:300]  PHYSICAL EXAMINATION:  Gen: sedated on vent, arouses to voice, follows simple commands HENT: NCAT ETT in place. Tongue looks smaller.  PULM: fair ae. Bibasilar crackles CV: RRR , (-) gallop noted, systolic murmur GI: BS+, soft, nontender MSK: normal bulk and tone. Gr 2 edema Neuro: sedated on vent, arouses to voice,  follows  commands  LABS:  BMET  Recent Labs Lab 07/06/2015 0400 07/12/15 0450 07/13/15 0500  NA 148* 148* 152*  K 4.2 3.9 4.5  CL 107 109 113*  CO2 29 28 27   BUN 60* 65* 74*  CREATININE 3.00* 2.89* 3.29*  GLUCOSE 162* 122* 149*    Electrolytes  Recent Labs Lab 07/07/15 0255  07/06/2015 0400 07/12/15 0450 07/13/15 0500  CALCIUM 8.3*  < > 9.2 9.2 9.1  MG 2.2  --   --   --   --   PHOS 4.3  --   --   --   --   < > = values in this interval not displayed.  CBC  Recent Labs Lab 07/12/15 1015 07/12/15 1922 07/13/15 0500  WBC 14.0* 12.6* 9.2  HGB 10.6* 10.4* 9.7*  HCT 32.8* 31.8* 30.6*  PLT 185 177 177    Coag's  Recent Labs Lab 07/13/2015 1848 07/12/15 0450 07/13/15 0500  APTT 39*  --   --   INR 1.76* 1.36 1.54*    Sepsis Markers No results for input(s): LATICACIDVEN, PROCALCITON, O2SATVEN in the last 168 hours.  ABG  Recent Labs Lab 07/07/15 0423 07/10/15 1020  PHART 7.387 7.404  PCO2ART 44.0 46.8*  PO2ART 129* 190*    Liver Enzymes  Recent Labs Lab 07/08/15 0425  AST 92*  ALT 174*  ALKPHOS 62  BILITOT 0.8  ALBUMIN 1.9*    Cardiac Enzymes No results for input(s): TROPONINI, PROBNP in the last 168  hours.  Glucose  Recent Labs Lab 07/12/15 1208 07/12/15 1633 07/12/15 2019 07/13/15 0033 07/13/15 0336 07/13/15 0749  GLUCAP 161* 145* 117* 141* 144* 122*    Imaging No results found.   STUDIES:  4/6 TEE 4/7 RHC And Coronary angiography Distal LAD , 30% stenosed, proximal Circumflex lesion 405 stenosed, proximal circumflex to mid circumflex 50% stenosed,1st Mrg lesion, 75% stenosed,1st RPLB lesion, 99% stenosed. 4/11 Bronchoscopy per Dr. Nelda Marseille "right mainstem hemorrhage" 4/18 Bronchoscopy > old blood in RLL  CULTURES: 4/11 BC >> (-) 4/11 sputum>> nml flora 4/11 urine>> e coli  4/17 resp > candida albicans  ANTIBIOTICS:  4/11 zosyn>> d/c  SIGNIFICANT EVENTS:  LINES/TUBES: PICC 4/8>>> 4/11 ET>>>  DISCUSSION: 65 YO female  with past medical Hx of  ischemic cardiomyopathy, stroke, coronary artery disease, lymphoma, CHF  S/p ICD placement ,HTN,HLD, Venticular tachycardia,chronic kidney disease -stage 3, depression, GERD, NSTEMI, had repiratory arrested,intubated and currently on vent.  Remains in renal failure and had hemoptysis overnight on 4/17 in setting of worsening hemoptysis.  Was noted to have bleeding on bronchoscopy on 4/11, recurrent in setting of elevated INR 4/17.  ASSESSMENT / PLAN:  PULMONARY A: Acute Hypoxemic respiratory failure 2/2 to pulm edema/unable to protect airway.  Possible LLung HCAP  Hemoptysis 4/16 in setting of elevated INR > resolved, uncertain etiology P:   Continue diuresis per cardiology Wean PEEP/FiO2 today Duoneb/albuterol Zosyn has been dcd > will observe.   CARDIOVASCULAR A:  NSTEMI Acute on Chronic systolic CHF-EF 123456, severe MR.  Acute CHF exacerbation, systolic.  Hx of CAD, S/P stenting Severe Pulm HTN PAF P:  Continue amiodarone per cardiology Tele Cont diuresis  RENAL A:   Chronic kidney disease -stage 3 Hyponatremia -resolved  P:   Monitor BMET and UOP Replace electrolytes as needed Lasix per cardiology  GASTROINTESTINAL A:  Diarrhea C-DIFF- Antigen+, toxin -negative -repeat same GERD Hematochezia in setting of elevated INR UGIB > recent maroon colored stools and melena.  P:  TF per nutrition  Protonix drip  to continue Appreciate GI recommendations. Agree with holding off on EGD or IR procedure/embolization. BP stable. Hb and hct stable. Will observe.    HEMATOLOGIC A:   Coagulopathy > due to warfarin Recent UGIB > EGD 4/18 blood in ulcerated duodenal bulb, visible vessel.  P:  SCD Check cbc later today.  transfuse for Hb < 7.  Hold oral coumadin  INFECTIOUS A:   Aspiration PNA E coli UTI C diff colonizer P:   Zosyn has been dc'd.  Low threshold for starting abx.  Will observe off abx.   ENDOCRINE A:    DM Hypothyroidism P:   SSI coverage Levothyroxine -via tube  NEUROLOGIC A:   Hx OF Depression/ anxiety P:   RASS goal -1 Hold paxil/ ct seroquel Continue fentanyl gtt per PAD protocol  FAMILY  - Updates: No family at bedside   - Inter-disciplinary family meet or Palliative Care meeting due by: 07/10/15    Critical Care time with this patient today : 34 minutes.  Monica Becton, MD 07/13/2015, 11:28 AM [place] [facility] Pager [phone] After 3 pm or if no answer, call [phone]

## 2015-07-13 NOTE — Progress Notes (Signed)
Patient has remained hemodynamically fairly stable, without further bleeding since this morning's episode noted in my note from earlier today.  I lavaged her OG tube.The tube flushed easily, but I could not get any return. The minimal drainage and the tube is old dark fluid, without evidence of any fresh blood.  Per PCCM, it appears that we are all content to continue observation without repeat endoscopy, assuming no evidence of dramatic rebleeding.  Cleotis Nipper, M.D. Pager [phone] If no answer or after 5 PM call [phone]

## 2015-07-13 NOTE — Progress Notes (Signed)
Advanced Heart Failure Rounding Note  PCP: Karna Dupes, PA-C Primary Cardiologist: Johnsie Cancel EP: Allred  Subjective:   This is a 65 year old female patient with h/o of CAD, HTN, ICM s/p MDT dual chamber ICD 2013, lymphoma s/p chemo/radiation 4174, chronic systolic CHF, status post VT ablation 2, HTN, HLD, VT, DM, CKD stage III, PMR and depression who presented to [facility] with CP and SOB  Started on empiric milrinone 07/02/15 with presentation concerning for low output.  PICC placed. On 4/11 developed respiratory distress requiring intubation and bronchoscopy. After intubation she was hypotensive and started on dopamine and continued on milrinone. Unfortunately later that evening she had V fib arrest multiple shocks. Milrinone and dopamine stopped. Started on lidocaine and amio drip.   Over weekend had pulmonary hemorrhage. On 4/18 had GI bleed requiring multiple blood products. Had EGD with duodenal ulcers with visible vessel. Unable to treat fully. On protonix. Hgb today 9.7.    Today having ongoing melena. Remains intubated and sedated. GI following - now good options for treatment. CVP 11   Developed AF this am despite IV amio    Objective:   Weight Range: 194 lb 7.1 oz (88.2 kg) Body mass index is 32.36 kg/(m^2).   Vital Signs:   Temp:  [97.2 F (36.2 C)-99.7 F (37.6 C)] 97.2 F (36.2 C) (04/20 0750) Pulse Rate:  [60-131] 131 (04/20 0850) Resp:  [11-21] 21 (04/20 0850) BP: (102-191)/(68-109) 127/88 mmHg (04/20 0800) SpO2:  [96 %-100 %] 98 % (04/20 0850) FiO2 (%):  [40 %] 40 % (04/20 0850) Weight:  [194 lb 7.1 oz (88.2 kg)] 194 lb 7.1 oz (88.2 kg) (04/20 0220) Last BM Date: 07/13/15  Weight change: Filed Weights   07/03/2015 0342 07/12/15 0500 07/13/15 0220  Weight: 189 lb 2.5 oz (85.8 kg) 194 lb 7.1 oz (88.2 kg) 194 lb 7.1 oz (88.2 kg)    Intake/Output:   Intake/Output Summary (Last 24 hours) at 07/13/15 0924 Last data filed at 07/13/15 0900  Gross per 24 hour    Intake 1680.8 ml  Output    795 ml  Net  885.8 ml     Physical Exam: CVP 12-13 General: Intubated. Sedated HEENT: ETT blood noted in tube. NG with coffee ground liquid.  Neck: supple. Carotids 2+ bilat; no bruits. No thyromegaly or nodule noted. Cor: PMI nondisplaced. RRR. No rubs or gallops. MR murmur Lungs: Coarse throughout Abdomen: soft, NT, ND, no HSM. No bruits or masses. +BS  Extremities: no cyanosis, clubbing, rash, edema. R and LLE cold Neuro: Intubated agitated. Not following commands.  GU: Foley.   Telemetry: A  Fib RVR  130s    Labs: CBC  Recent Labs  07/09/2015 0400  07/12/15 0450  07/12/15 1922 07/13/15 0500  WBC 9.6  < > 10.1  < > 12.6* 9.2  NEUTROABS 6.5  --  8.3*  --   --   --   HGB 7.3*  < > 8.7*  < > 10.4* 9.7*  HCT 23.5*  < > 26.1*  < > 31.8* 30.6*  MCV 94.4  < > 88.2  < > 87.4 89.2  PLT 204  < > 147*  < > 177 177  < > = values in this interval not displayed. Basic Metabolic Panel  Recent Labs  07/12/15 0450 07/13/15 0500  NA 148* 152*  K 3.9 4.5  CL 109 113*  CO2 28 27  GLUCOSE 122* 149*  BUN 65* 74*  CREATININE 2.89* 3.29*  CALCIUM 9.2 9.1  Liver Function Tests No results for input(s): AST, ALT, ALKPHOS, BILITOT, PROT, ALBUMIN in the last 72 hours. No results for input(s): LIPASE, AMYLASE in the last 72 hours. Cardiac Enzymes No results for input(s): CKTOTAL, CKMB, CKMBINDEX, TROPONINI in the last 72 hours.  BNP: BNP (last 3 results)  Recent Labs  02/21/15 1042 03/01/15 0839 04/13/15 2215  BNP 386.1* 452.0* 894.0*    ProBNP (last 3 results) No results for input(s): PROBNP in the last 8760 hours.   D-Dimer No results for input(s): DDIMER in the last 72 hours. Hemoglobin A1C No results for input(s): HGBA1C in the last 72 hours. Fasting Lipid Panel No results for input(s): CHOL, HDL, LDLCALC, TRIG, CHOLHDL, LDLDIRECT in the last 72 hours. Thyroid Function Tests No results for input(s): TSH, T4TOTAL, T3FREE, THYROIDAB  in the last 72 hours.  Invalid input(s): FREET3  Other results:     Imaging/Studies:  Dg Chest Port 1 View  07/12/2015  CLINICAL DATA:  Acute respiratory failure with hypoxemia, shortness of breath. EXAM: PORTABLE CHEST 1 VIEW COMPARISON:  06/28/2015 and CT chest 04/14/2015. FINDINGS: Endotracheal tube terminates approximately 4.8 cm above the carina. Nasogastric tube is followed into the stomach. Left subclavian pacemaker and ICD lead tips are stable in position. Right PICC tip projects over the SVC. Cardiomediastinal silhouette is enlarged, stable. There is moderate diffuse bilateral airspace opacification with subtotal collapse/ consolidation in the left lower lobe. Small left pleural effusion. IMPRESSION: 1. Moderate diffuse bilateral patchy airspace opacification may be due to edema or pneumonia. 2. Subtotal left lower lobe collapse/consolidation. 3. Small left pleural effusion. Electronically Signed   By: Lorin Picket M.D.   On: 07/12/2015 07:14    Latest Echo  Latest Cath   Medications:     Scheduled Medications: . allopurinol  100 mg Per Tube Daily  . antiseptic oral rinse  7 mL Mouth Rinse 10 times per day  . aspirin  81 mg Per Tube Daily  . chlorhexidine gluconate (SAGE KIT)  15 mL Mouth Rinse BID  . colchicine  0.3 mg Oral Daily  . feeding supplement (PRO-STAT SUGAR FREE 64)  30 mL Per Tube BID  . feeding supplement (VITAL HIGH PROTEIN)  1,000 mL Per Tube Q24H  . ferrous sulfate  300 mg Per Tube BID WC  . furosemide  80 mg Intravenous BID  . insulin aspart  0-9 Units Subcutaneous 6 times per day  . levothyroxine  100 mcg Per Tube QAC breakfast  . PARoxetine  10 mg Oral Daily  . potassium chloride  40 mEq Oral BID  . QUEtiapine  25 mg Oral QHS  . sodium chloride flush  10-40 mL Intracatheter Q12H  . sucralfate  1 g Oral TID AC & HS    Infusions: . sodium chloride 10 mL/hr (07/13/15 0838)  . amiodarone 30 mg/hr (07/12/15 2116)  . fentaNYL infusion INTRAVENOUS  200 mcg/hr (07/13/15 0843)  . norepinephrine (LEVOPHED) Adult infusion Stopped (07/15/2015 1230)  . pantoprozole (PROTONIX) infusion 8 mg/hr (07/12/15 2116)    PRN Medications: [DISCONTINUED] acetaminophen **OR** acetaminophen, acetaminophen, albuterol, diphenhydrAMINE **OR** diphenhydrAMINE, fentaNYL, hydrALAZINE, metoprolol, midazolam, nitroGLYCERIN, ondansetron (ZOFRAN) IV, ondansetron **OR** [DISCONTINUED] ondansetron (ZOFRAN) IV, promethazine, sodium chloride flush   Assessment   1. Respiratory Arrest 2. VF Arrest 3.Acute on chronic combined CHF - EF 20-25% w/ diffuse HK and akinesis of the inferolateral and inferior myocardium, G2DD, severe MR, mild RV dysfunction/dilation, + atrial septal anuerysm 4. Severe MR - by TEE 5.  Severe Pulm HTN - PA pressure  90 mm Hg 6.  PAF---A fib over ngiht.  7. AKI on CKD stage IV  8. Chronic anemia - workup in 03/2014 unremarkable GI bleed. 9. DM2 10. HLD 11. Hypokalemia 12. LCB-  No external shocks. Loiza for meds and drugs.  13. Hemoptysis/pulmonary hemorrhage 14.  GI bleed .      Plan    Remains intubated.   Received multiple blood products for GI bleed. Had EGD with duodenal ulcers noted. On protonix. Ongoing melena over night. GI following closely. +/- EGD today.   Co-ox 59% . No further VT but A fib RVR. Increase amio drip to 60 mg per hour.  If doesn't convert will need cardioversion.   Renal function trending up. CVP 13-14. Stop diuretics with large GI bleed.      INR 1.54.    Length of Stay: [place] NP-C  07/13/2015, 9:24 AM  Advanced Heart Failure Team Pager [phone] (M-F; 7a - 4p)  Please contact [facility] for night-coverage after hours (4p -7a ) and weekends on amion  Patient seen and examined with Darrick Grinder, NP. We discussed all aspects of the encounter. I agree with the assessment and plan as stated above.   She remains critically ill with ongoing GI bleed, respiratory failure, HF and new onset AF. GI  following and unfortunately no clear option to treat PUD. Will hold diuretics for now and possibly restart tomorrow. Would be aggressive about treating AF. Increase IV amio. If doesn't convert soon will plan DC-CV within first 24 hours to avoid any need for [facility]. Continue SCDs.  Prognosis remains very tenuous.   The patient is critically ill with multiple organ systems failure and requires high complexity decision making for assessment and support, frequent evaluation and titration of therapies, application of advanced monitoring technologies and extensive interpretation of multiple databases.   Critical Care Time devoted to patient care services described in this note is 35 Minutes.  Diannia Hogenson,MD 6:43 PM

## 2015-07-14 ENCOUNTER — Inpatient Hospital Stay (HOSPITAL_COMMUNITY): Payer: Medicare Other

## 2015-07-14 LAB — BASIC METABOLIC PANEL
Anion gap: 12 (ref 5–15)
BUN: 84 mg/dL — ABNORMAL HIGH (ref 6–20)
CALCIUM: 9.2 mg/dL (ref 8.9–10.3)
CO2: 24 mmol/L (ref 22–32)
CREATININE: 3.81 mg/dL — AB (ref 0.44–1.00)
Chloride: 111 mmol/L (ref 101–111)
GFR calc non Af Amer: 12 mL/min — ABNORMAL LOW (ref >60)
GFR, EST AFRICAN AMERICAN: 13 mL/min — AB (ref >60)
GLUCOSE: 151 mg/dL — AB (ref 65–99)
Potassium: 4.8 mmol/L (ref 3.5–5.1)
Sodium: 147 mmol/L — ABNORMAL HIGH (ref 135–145)

## 2015-07-14 LAB — PROTIME-INR
INR: 1.84 — ABNORMAL HIGH (ref 0.00–1.49)
PROTHROMBIN TIME: 21.2 s — AB (ref 11.6–15.2)

## 2015-07-14 LAB — CARBOXYHEMOGLOBIN
CARBOXYHEMOGLOBIN: 2.1 % — AB (ref 0.5–1.5)
CARBOXYHEMOGLOBIN: 2.6 % — AB (ref 0.5–1.5)
Methemoglobin: 0.8 % (ref 0.0–1.5)
Methemoglobin: 1 % (ref 0.0–1.5)
O2 SAT: 76.9 %
O2 Saturation: 78.8 %
Total hemoglobin: 10.2 g/dL — ABNORMAL LOW (ref 12.0–16.0)
Total hemoglobin: 10.1 g/dL — ABNORMAL LOW (ref 12.0–16.0)

## 2015-07-14 LAB — GLUCOSE, CAPILLARY
GLUCOSE-CAPILLARY: 91 mg/dL (ref 65–99)
Glucose-Capillary: 135 mg/dL — ABNORMAL HIGH (ref 65–99)
Glucose-Capillary: 142 mg/dL — ABNORMAL HIGH (ref 65–99)
Glucose-Capillary: 113 mg/dL — ABNORMAL HIGH (ref 65–99)
Glucose-Capillary: 122 mg/dL — ABNORMAL HIGH (ref 65–99)

## 2015-07-14 LAB — CBC
HCT: 31.5 % — ABNORMAL LOW (ref 36.0–46.0)
Hemoglobin: 9.7 g/dL — ABNORMAL LOW (ref 12.0–15.0)
MCH: 28.3 pg (ref 26.0–34.0)
MCHC: 30.8 g/dL (ref 30.0–36.0)
MCV: 91.8 fL (ref 78.0–100.0)
PLATELETS: 208 10*3/uL (ref 150–400)
RBC: 3.43 MIL/uL — AB (ref 3.87–5.11)
RDW: 19.9 % — ABNORMAL HIGH (ref 11.5–15.5)
WBC: 9.4 10*3/uL (ref 4.0–10.5)

## 2015-07-14 MED ORDER — SODIUM CHLORIDE 0.9 % IV SOLN
8.0000 mg/h | INTRAVENOUS | Status: DC
  Administered 2015-07-14 – 2015-07-17 (×7): 8 mg/h via INTRAVENOUS
  Filled 2015-07-14 (×13): qty 80

## 2015-07-14 NOTE — Progress Notes (Signed)
eLink Physician-Brief Progress Note Patient Name: [name] Terry DOB: 1950/06/02 MRN: [id number]   Date of Service  07/14/2015  HPI/Events of Note  Patient with AKI and potassium level of 4.8 gradually climbing on BID potassium replacement.  eICU Interventions  Plan: D/C potassium replacement Monitor electrolytes     Intervention Category Intermediate Interventions: Electrolyte abnormality - evaluation and management  DETERDING,ELIZABETH 07/14/2015, 9:51 PM

## 2015-07-14 NOTE — Progress Notes (Signed)
Pt left arm found edematous with some discoloration. NP aware. IV d/c'd

## 2015-07-14 NOTE — Progress Notes (Signed)
Patient's potassium level 4.8 and patient's urine output for the day is 210 mL.  Spoke with MD Deterding regarding patient's scheduled potassium dose, holding scheduled potassium per MD Deterding.

## 2015-07-14 NOTE — Progress Notes (Signed)
Nutrition Follow-up  DOCUMENTATION CODES:   Obesity unspecified  INTERVENTION:   Once able to resume TF recommend: Vital High Protein @ 40 ml/hr 30 ml Prostat BID Provides: 1160 kcal, 114 grams protein, and 802 ml H2O.  NUTRITION DIAGNOSIS:   Inadequate oral intake related to inability to eat as evidenced by NPO status. Ongoing.   GOAL:   Provide needs based on ASPEN/SCCM guidelines Not met.   MONITOR:   Vent status, Labs, I & O's, TF tolerance  ASSESSMENT:   This is a 65 year old female patient with h/o of CAD, HTN, ICM s/p MDT dual chamber ICD 2013, lymphoma s/p chemo/radiation 8978, chronic systolic CHF, status post VT ablation 2, HTN, HLD, VT, DM, CKD stage III, PMR and depression who presented to [facility] with CP and SOB  4/11 respiratory arrest, VF arrest 4/18 1020 TF stopped due to GI bleed remains off 4/21 emesis GI following for bleed, currently monitoring.   Patient is currently intubated on ventilator support MV: 12.1 L/min Temp (24hrs), Avg:98.3 F (36.8 C), Min:97.7 F (36.5 C), Max:99 F (37.2 C)  Medications reviewed and include: ferrous sulfate, KCl, carafate,  Labs reviewed: sodium elevated 147, BUN/Cr increasing 84/3.81 OG tube  Diet Order:  Diet NPO time specified  Skin:  Reviewed, no issues  Last BM:  4/20  Height:   Ht Readings from Last 1 Encounters:  07/06/15 '5\' 5"'$  (1.651 m)    Weight:   Wt Readings from Last 1 Encounters:  07/14/15 197 lb 8.5 oz (89.6 kg)    Ideal Body Weight:  56.81 kg  BMI:  Body mass index is 32.87 kg/(m^2).  Estimated Nutritional Needs:   Kcal:  478-4128  Protein:  >/=113 grams  Fluid:  >/= 1.6L  EDUCATION NEEDS:   No education needs identified at this time  [place], [place], [place] Pager [phone] After Hours Pager

## 2015-07-14 NOTE — Progress Notes (Signed)
Pt HR was in the 130-140s, lopressor given at 1215. Pt HR now in the 60s at 1600 with occasional A/V paced beats. Pt is also going in/out of Afib. BP 96/56 (68). RN will continue to monitor.

## 2015-07-14 NOTE — Progress Notes (Signed)
Pt asleep.  Per RN, no BM's, but had large coffe grd emesis this a.m.--despite OG to LIS.  BP is soft, HR ok.  OG currently draining sm amnt bilious fluid.  Hgb is stable over past 24hrs  IMPR:  quiscent UGIB from DU w/ moderately severe, but stable, acute post hemorrhagic anemia.  PLAN:    1. Would continue pantoprazole infusion another day or two; by then, should be "out of the woods" and ok for transition to bid dosing 2. Would continue sucralfate for another week, then ok to d/c. 3. No need for repeat egd unless moderately-destabilizing rebleed occurs (if exsanguinating rebleed occurs, would go straight to IR). 4. Will sign off; call us back as needed.  Cleotis Nipper, M.D. Pager [phone] If no answer or after 5 PM call [phone]

## 2015-07-14 NOTE — Progress Notes (Signed)
Advanced Heart Failure Rounding Note  PCP: Karna Dupes, PA-C Primary Cardiologist: Johnsie Cancel EP: Allred  Subjective:   This is a 65 year old female patient with h/o of CAD, HTN, ICM s/p MDT dual chamber ICD 2013, lymphoma s/p chemo/radiation 5170, chronic systolic CHF, status post VT ablation 2, HTN, HLD, VT, DM, CKD stage III, PMR and depression who presented to [facility] with CP and SOB  Started on empiric milrinone 07/02/15 with presentation concerning for low output.  PICC placed. On 4/11 developed respiratory distress requiring intubation and bronchoscopy. After intubation she was hypotensive and started on dopamine and continued on milrinone. Unfortunately later that evening she had V fib arrest multiple shocks. Milrinone and dopamine stopped. Started on lidocaine and amio drip.   Over weekend had pulmonary hemorrhage. On 4/18 had GI bleed requiring multiple blood products. Had EGD with duodenal ulcers with visible vessel. Unable to treat fully. On protonix.   Hgb today unchanged.  No further melena. Remains intubated and sedated.   Objective:   Weight Range: 197 lb 8.5 oz (89.6 kg) Body mass index is 32.87 kg/(m^2).   Vital Signs:   Temp:  [97.2 F (36.2 C)-98.7 F (37.1 C)] 97.8 F (36.6 C) (04/21 0327) Pulse Rate:  [64-142] 70 (04/21 0500) Resp:  [12-26] 13 (04/21 0500) BP: (74-171)/(53-114) 132/73 mmHg (04/21 0500) SpO2:  [92 %-100 %] 99 % (04/21 0500) FiO2 (%):  [40 %] 40 % (04/21 0400) Weight:  [197 lb 8.5 oz (89.6 kg)] 197 lb 8.5 oz (89.6 kg) (04/21 0327) Last BM Date: 07/13/15  Weight change: Filed Weights   07/12/15 0500 07/13/15 0220 07/14/15 0327  Weight: 194 lb 7.1 oz (88.2 kg) 194 lb 7.1 oz (88.2 kg) 197 lb 8.5 oz (89.6 kg)    Intake/Output:   Intake/Output Summary (Last 24 hours) at 07/14/15 0734 Last data filed at 07/14/15 0500  Gross per 24 hour  Intake 2374.93 ml  Output    975 ml  Net 1399.93 ml     Physical Exam: CVP 10  General:  Intubated. Sedated HEENT: ETT blood noted in tube. NG with coffee ground liquid.  Neck: supple. Carotids 2+ bilat; no bruits. No thyromegaly or nodule noted. Cor: PMI nondisplaced. RRR. No rubs or gallops. MR murmur Lungs: Coarse throughout Abdomen: soft, NT, ND, no HSM. No bruits or masses. +BS  Extremities: no cyanosis, clubbing, rash, edema. R and LLE SCDs.  Neuro: Intubated agitated. Not following commands.  GU: Foley.   Telemetry: NSR 60s    Labs: CBC  Recent Labs  07/12/15 0450  07/13/15 1545 07/14/15 0431  WBC 10.1  < > 14.0* 9.4  NEUTROABS 8.3*  --   --   --   HGB 8.7*  < > 10.4* 9.7*  HCT 26.1*  < > 32.8* 31.5*  MCV 88.2  < > 91.1 91.8  PLT 147*  < > 207 208  < > = values in this interval not displayed. Basic Metabolic Panel  Recent Labs  07/13/15 0500 07/14/15 0431  NA 152* 147*  K 4.5 4.8  CL 113* 111  CO2 27 24  GLUCOSE 149* 151*  BUN 74* 84*  CREATININE 3.29* 3.81*  CALCIUM 9.1 9.2   Liver Function Tests No results for input(s): AST, ALT, ALKPHOS, BILITOT, PROT, ALBUMIN in the last 72 hours. No results for input(s): LIPASE, AMYLASE in the last 72 hours. Cardiac Enzymes No results for input(s): CKTOTAL, CKMB, CKMBINDEX, TROPONINI in the last 72 hours.  BNP: BNP (last 3 results)  Recent Labs  02/21/15 1042 03/01/15 0839 04/13/15 2215  BNP 386.1* 452.0* 894.0*    ProBNP (last 3 results) No results for input(s): PROBNP in the last 8760 hours.   D-Dimer No results for input(s): DDIMER in the last 72 hours. Hemoglobin A1C No results for input(s): HGBA1C in the last 72 hours. Fasting Lipid Panel No results for input(s): CHOL, HDL, LDLCALC, TRIG, CHOLHDL, LDLDIRECT in the last 72 hours. Thyroid Function Tests No results for input(s): TSH, T4TOTAL, T3FREE, THYROIDAB in the last 72 hours.  Invalid input(s): FREET3  Other results:     Imaging/Studies:  Dg Chest Port 1 View  07/14/2015  CLINICAL DATA:  Respiratory failure,  cardiomyopathy, diabetes, former smoker. EXAM: PORTABLE CHEST 1 VIEW COMPARISON:  Chest x-ray of July 12, 2015. FINDINGS: The lungs are reasonably well inflated. Confluent alveolar opacities persist bilaterally. The left hemidiaphragm is slightly better visualized today. The cardiac silhouette remains enlarged. The pulmonary vascularity remains engorged. The permanent pacemaker defibrillator is in stable position. The endotracheal tube tip lies at the level of the inferior margin of the clavicular head 4.7 cm above the carina. The esophagogastric tube tip projects below the GE junction. IMPRESSION: Slight interval improvement and confluent alveolar opacities which may reflect improving pulmonary alveolar edema or pneumonia. No pneumothorax nor large pleural effusion. The support tubes are in reasonable position. Electronically Signed   By: David  Martinique M.D.   On: 07/14/2015 07:18    Latest Echo  Latest Cath   Medications:     Scheduled Medications: . allopurinol  100 mg Per Tube Daily  . antiseptic oral rinse  7 mL Mouth Rinse 10 times per day  . aspirin  81 mg Per Tube Daily  . chlorhexidine gluconate (SAGE KIT)  15 mL Mouth Rinse BID  . colchicine  0.3 mg Oral Daily  . feeding supplement (PRO-STAT SUGAR FREE 64)  30 mL Per Tube BID  . feeding supplement (VITAL HIGH PROTEIN)  1,000 mL Per Tube Q24H  . ferrous sulfate  300 mg Per Tube BID WC  . furosemide  80 mg Intravenous BID  . insulin aspart  0-9 Units Subcutaneous 6 times per day  . levothyroxine  100 mcg Per Tube QAC breakfast  . PARoxetine  10 mg Oral Daily  . potassium chloride  40 mEq Oral BID  . QUEtiapine  25 mg Oral QHS  . sodium chloride flush  10-40 mL Intracatheter Q12H  . sucralfate  1 g Oral TID AC & HS    Infusions: . sodium chloride 10 mL/hr (07/13/15 0838)  . amiodarone 30 mg/hr (07/14/15 0524)  . fentaNYL infusion INTRAVENOUS 250 mcg/hr (07/14/15 0524)  . norepinephrine (LEVOPHED) Adult infusion Stopped  (07/01/2015 1230)  . pantoprozole (PROTONIX) infusion 8 mg/hr (07/13/15 2219)    PRN Medications: [DISCONTINUED] acetaminophen **OR** acetaminophen, acetaminophen, albuterol, diphenhydrAMINE **OR** diphenhydrAMINE, fentaNYL, hydrALAZINE, metoprolol, midazolam, nitroGLYCERIN, ondansetron (ZOFRAN) IV, ondansetron **OR** [DISCONTINUED] ondansetron (ZOFRAN) IV, promethazine, sodium chloride flush   Assessment   1. Respiratory Arrest 2. VF Arrest 3.Acute on chronic combined CHF - EF 20-25% w/ diffuse HK and akinesis of the inferolateral and inferior myocardium, G2DD, severe MR, mild RV dysfunction/dilation, + atrial septal anuerysm 4. Severe MR - by TEE 5.  Severe Pulm HTN - PA pressure 90 mm Hg 6.  PAF---A fib over ngiht.  7. AKI on CKD stage IV  8. Chronic anemia - workup in 03/2014 unremarkable GI bleed. 9. DM2 10. HLD 11. Hypokalemia 12. LCB-  No external shocks. Ok  for meds and drugs.  13. Hemoptysis/pulmonary hemorrhage 14.  GI bleed .    Plan    Remains intubated.   Received multiple blood products for GI bleed. Had EGD with duodenal ulcers noted. On protonix. GI bleed seems to have slowed. No further melena. Hgs stable.   Co-ox 79% . No further VT. Yesterday had A fib RVR.  Received extra amio and converted to NSR. Continue amio drip at 30 mg per hour. Off anticoagulation with GI bleed.    Renal function trending up. CVP10.  Stop diuretics with large GI bleed.  Can restart as needed.     INR 1.84  Length of Stay: [place] NP-C  07/14/2015, 7:34 AM  Advanced Heart Failure Team Pager [phone] (M-F; 7a - 4p)  Please contact [facility] for night-coverage after hours (4p -7a ) and weekends on amion  Patient seen and examined with Darrick Grinder, NP. We discussed all aspects of the encounter. I agree with the assessment and plan as stated above.   Currently stable from cardiac standpoint. Unable to wean off vent due to multiple issues. Will likely need trach. AF  quiescent on amio. Diuretics on hold. Can restart as CVP goes back up. Long-term prognosis likely poor.   We will see again Monday. Please call with questions.   Dmario Russom,MD 7:14 PM

## 2015-07-14 NOTE — Progress Notes (Signed)
PULMONARY / CRITICAL CARE MEDICINE   Name: [name] LUNDBLAD MRN: [id number] DOB: [birth year]    ADMISSION DATE:  07/14/2015 CONSULTATION DATE:  07/04/15  REFERRING MD:  Fanny Bien  CHIEF COMPLAINT:  Respiratory arrest  HISTORY OF PRESENT ILLNESS:   [name] Terry is 65 yo female with past medical history significant for ischemic cardiomyopathy, stroke, coronary artery disease, lymphoma, CHF  S/p ICD placement ,HTN,HLD, Venticular tachycardia,chronic kidney disease -stage 3, depression, GERD.  Patient presented to ED with chest pain and LHC was not indicative of PCI at the time and was medically managed.  Started on empiric milrinone 07/02/15 with presentation concerning for low output On 4/11 developed  respiratory arrest.  Patient never lost her pulse and was intubated , now on vent. Developed VF arrest 4/11 evening requiring multiple shocks  SUBJECTIVE:  Failed PST this am > was drowsy and doing poor volumes (-) note of further melena,   VITAL SIGNS: BP 150/76 mmHg  Pulse 144  Temp(Src) 99 F (37.2 C) (Oral)  Resp 17  Ht 5\' 5"  (1.651 m)  Wt 197 lb 8.5 oz (89.6 kg)  BMI 32.87 kg/m2  SpO2 99%  HEMODYNAMICS: CVP:  [10 mmHg-18 mmHg] 10 mmHg  VENTILATOR SETTINGS: Vent Mode:  [-] PRVC FiO2 (%):  [40 %] 40 % Set Rate:  [12 bmp] 12 bmp Vt Set:  [500 mL] 500 mL PEEP:  [5 cmH20] 5 cmH20 Plateau Pressure:  [17 cmH20-24 cmH20] 24 cmH20  INTAKE / OUTPUT: I/O last 3 completed shifts: In: 3268.7 [I.V.:2968.7; NG/GT:300] Out: 1410 [Urine:1210; Emesis/NG output:200]  PHYSICAL EXAMINATION:  Gen: sedated on vent, arouses to voice, follows simple commands HENT: NCAT ETT in place. Tongue looks smaller.  PULM: fair ae. Bibasilar crackles CV: RRR , (-) gallop noted, systolic murmur GI: BS+, soft, nontender MSK: normal bulk and tone. Gr 2 edema Neuro: sedated on vent, arouses to voice,  follows commands  LABS:  BMET  Recent Labs Lab 07/12/15 0450 07/13/15 0500 07/14/15 0431  NA 148*  152* 147*  K 3.9 4.5 4.8  CL 109 113* 111  CO2 28 27 24   BUN 65* 74* 84*  CREATININE 2.89* 3.29* 3.81*  GLUCOSE 122* 149* 151*    Electrolytes  Recent Labs Lab 07/12/15 0450 07/13/15 0500 07/14/15 0431  CALCIUM 9.2 9.1 9.2    CBC  Recent Labs Lab 07/13/15 0500 07/13/15 1545 07/14/15 0431  WBC 9.2 14.0* 9.4  HGB 9.7* 10.4* 9.7*  HCT 30.6* 32.8* 31.5*  PLT 177 207 208    Coag's  Recent Labs Lab 07/13/2015 1848 07/12/15 0450 07/13/15 0500 07/14/15 0431  APTT 39*  --   --   --   INR 1.76* 1.36 1.54* 1.84*    Sepsis Markers No results for input(s): LATICACIDVEN, PROCALCITON, O2SATVEN in the last 168 hours.  ABG  Recent Labs Lab 07/10/15 1020  PHART 7.404  PCO2ART 46.8*  PO2ART 190*    Liver Enzymes  Recent Labs Lab 07/08/15 0425  AST 92*  ALT 174*  ALKPHOS 62  BILITOT 0.8  ALBUMIN 1.9*    Cardiac Enzymes No results for input(s): TROPONINI, PROBNP in the last 168 hours.  Glucose  Recent Labs Lab 07/13/15 1555 07/13/15 2010 07/13/15 2339 07/14/15 0404 07/14/15 0831 07/14/15 1138  GLUCAP 139* 129* 149* 135* 91 142*    Imaging Dg Chest Port 1 View  07/14/2015  CLINICAL DATA:  Respiratory failure, cardiomyopathy, diabetes, former smoker. EXAM: PORTABLE CHEST 1 VIEW COMPARISON:  Chest x-ray of July 12, 2015.  FINDINGS: The lungs are reasonably well inflated. Confluent alveolar opacities persist bilaterally. The left hemidiaphragm is slightly better visualized today. The cardiac silhouette remains enlarged. The pulmonary vascularity remains engorged. The permanent pacemaker defibrillator is in stable position. The endotracheal tube tip lies at the level of the inferior margin of the clavicular head 4.7 cm above the carina. The esophagogastric tube tip projects below the GE junction. IMPRESSION: Slight interval improvement and confluent alveolar opacities which may reflect improving pulmonary alveolar edema or pneumonia. No pneumothorax nor  large pleural effusion. The support tubes are in reasonable position. Electronically Signed   By: David  Martinique M.D.   On: 07/14/2015 07:18     STUDIES:  4/6 TEE 4/7 RHC And Coronary angiography Distal LAD , 30% stenosed, proximal Circumflex lesion 405 stenosed, proximal circumflex to mid circumflex 50% stenosed,1st Mrg lesion, 75% stenosed,1st RPLB lesion, 99% stenosed. 4/11 Bronchoscopy per Dr. Nelda Marseille "right mainstem hemorrhage" 4/18 Bronchoscopy > old blood in RLL  CULTURES: 4/11 BC >> (-) 4/11 sputum>> nml flora 4/11 urine>> e coli  4/17 resp > candida albicans  ANTIBIOTICS:  4/11 zosyn>> d/c  SIGNIFICANT EVENTS:  LINES/TUBES: PICC 4/8>>> 4/11 ET>>>  DISCUSSION: 65 YO female with past medical Hx of  ischemic cardiomyopathy, stroke, coronary artery disease, lymphoma, CHF  S/p ICD placement ,HTN,HLD, Venticular tachycardia,chronic kidney disease -stage 3, depression, GERD, NSTEMI, had repiratory arrested,intubated and currently on vent.  Remains in renal failure and had hemoptysis overnight on 4/17 in setting of worsening hemoptysis.  Was noted to have bleeding on bronchoscopy on 4/11, recurrent in setting of elevated INR 4/17.  ASSESSMENT / PLAN:  PULMONARY A: Acute Hypoxemic respiratory failure 2/2 to pulm edema/unable to protect airway.  Possible LLung HCAP  Hemoptysis 4/16 in setting of elevated INR > resolved, uncertain etiology P:   Continue diuresis per cardiology Wean PEEP/FiO2 today Duoneb/albuterol Zosyn has been dcd > will observe.   CARDIOVASCULAR A:  NSTEMI Acute on Chronic systolic CHF-EF 123456, severe MR.  Acute CHF exacerbation, systolic.  Hx of CAD, S/P stenting Severe Pulm HTN PAF P:  Continue amiodarone per cardiology Tele Diuresis on hold 2/2 aki  RENAL A:   AKI/Chronic kidney disease -stage 3 Hypernatremia  P:   Monitor BMET and UOP Replace electrolytes as needed Lasix per cardiology > on hold 2/2 to AKI  GASTROINTESTINAL A:   Diarrhea C-DIFF- Antigen+, toxin -negative -repeat same GERD Hematochezia in setting of elevated INR UGIB > recent maroon colored stools and melena.  P:  TF per nutrition  Protonix drip  to continue Appreciate GI recommendations. Agree with holding off on EGD or IR procedure/embolization. BP stable. Hb and hct stable. Will observe.    HEMATOLOGIC A:   Coagulopathy > due to warfarin Recent UGIB > EGD 4/18 blood in ulcerated duodenal bulb, visible vessel.  P:  SCD transfuse for Hb < 7.  Hold oral coumadin  INFECTIOUS A:   Aspiration PNA E coli UTI C diff colonizer P:   Zosyn has been dc'd.  Low threshold for starting abx.  Will observe off abx.   ENDOCRINE A:   DM Hypothyroidism P:   SSI coverage Levothyroxine -via tube  NEUROLOGIC A:   Hx OF Depression/ anxiety P:   RASS goal -1 Hold paxil/ ct seroquel Continue fentanyl gtt per PAD protocol  FAMILY  - Updates: Family updated. Pt slow to recover, very deconditioned. May need a trache. Mentioned to family > they agree.  - Inter-disciplinary family meet or Palliative Care meeting due  by: 07/10/15    Critical Care time with this patient today : 33 minutes.  Monica Becton, MD 07/14/2015, 1:59 PM [facility] Pager [phone] After 3 pm or if no answer, call [phone]

## 2015-07-14 NOTE — Progress Notes (Addendum)
Pt vomited copious amounts of coffee ground emesis at this time around ETT/OG tube.  Mouth suctioned, Sats 100% on 40%. Phenergan administered.

## 2015-07-14 NOTE — Progress Notes (Signed)
5 mg Lopressor given at this time for HR 130-140s. BP stable. RN will continue to monitor

## 2015-07-15 LAB — BASIC METABOLIC PANEL
ANION GAP: 14 (ref 5–15)
BUN: 94 mg/dL — ABNORMAL HIGH (ref 6–20)
CALCIUM: 9.3 mg/dL (ref 8.9–10.3)
CO2: 21 mmol/L — AB (ref 22–32)
CREATININE: 4.28 mg/dL — AB (ref 0.44–1.00)
Chloride: 113 mmol/L — ABNORMAL HIGH (ref 101–111)
GFR, EST AFRICAN AMERICAN: 12 mL/min — AB (ref >60)
GFR, EST NON AFRICAN AMERICAN: 10 mL/min — AB (ref >60)
GLUCOSE: 106 mg/dL — AB (ref 65–99)
Potassium: 5 mmol/L (ref 3.5–5.1)
Sodium: 148 mmol/L — ABNORMAL HIGH (ref 135–145)

## 2015-07-15 LAB — CBC
HCT: 32.9 % — ABNORMAL LOW (ref 36.0–46.0)
Hemoglobin: 9.9 g/dL — ABNORMAL LOW (ref 12.0–15.0)
MCH: 28.5 pg (ref 26.0–34.0)
MCHC: 30.1 g/dL (ref 30.0–36.0)
MCV: 94.8 fL (ref 78.0–100.0)
PLATELETS: 236 10*3/uL (ref 150–400)
RBC: 3.47 MIL/uL — ABNORMAL LOW (ref 3.87–5.11)
RDW: 20.9 % — ABNORMAL HIGH (ref 11.5–15.5)
WBC: 9.9 10*3/uL (ref 4.0–10.5)

## 2015-07-15 LAB — CARBOXYHEMOGLOBIN
CARBOXYHEMOGLOBIN: 2.1 % — AB (ref 0.5–1.5)
METHEMOGLOBIN: 0.8 % (ref 0.0–1.5)
O2 SAT: 77.4 %
Total hemoglobin: 9.5 g/dL — ABNORMAL LOW (ref 12.0–16.0)

## 2015-07-15 LAB — GLUCOSE, CAPILLARY
GLUCOSE-CAPILLARY: 103 mg/dL — AB (ref 65–99)
GLUCOSE-CAPILLARY: 94 mg/dL (ref 65–99)
GLUCOSE-CAPILLARY: 96 mg/dL (ref 65–99)
Glucose-Capillary: 100 mg/dL — ABNORMAL HIGH (ref 65–99)
Glucose-Capillary: 91 mg/dL (ref 65–99)
Glucose-Capillary: 136 mg/dL — ABNORMAL HIGH (ref 65–99)

## 2015-07-15 LAB — PROTIME-INR
INR: 2.49 — AB (ref 0.00–1.49)
PROTHROMBIN TIME: 26.6 s — AB (ref 11.6–15.2)

## 2015-07-15 MED ORDER — FUROSEMIDE 10 MG/ML IJ SOLN: 40.0000 mg | Freq: Once | INTRAMUSCULAR | Status: DC

## 2015-07-15 NOTE — Progress Notes (Signed)
PULMONARY / CRITICAL CARE MEDICINE   Name: [name] Terry MRN: [id number] DOB: 01-16-1951    ADMISSION DATE:  07/02/2015 CONSULTATION DATE:  07/04/15  REFERRING MD:  Fanny Bien  CHIEF COMPLAINT:  Respiratory arrest  HISTORY OF PRESENT ILLNESS:   [name] Terry is 65 yo female with past medical history significant for ischemic cardiomyopathy, stroke, coronary artery disease, lymphoma, CHF  S/p ICD placement ,HTN,HLD, Venticular tachycardia,chronic kidney disease -stage 3, depression, GERD.  Patient presented to ED with chest pain and LHC was not indicative of PCI at the time and was medically managed.  Started on empiric milrinone 07/02/15 with presentation concerning for low output On 4/11 developed  respiratory arrest.  Patient never lost her pulse and was intubated , now on vent. Developed VF arrest 4/11 evening requiring multiple shocks  SUBJECTIVE:  Failed PST this am > was drowsy and doing poor volumes (-) note of further melena. Pt is not really waiking up. Not following commands. Even off sedation. Not sure if this is baseline.   VITAL SIGNS: BP 141/76 mmHg  Pulse 73  Temp(Src) 98.6 F (37 C) (Oral)  Resp 24  Ht 5\' 5"  (1.651 m)  Wt 201 lb 15.1 oz (91.6 kg)  BMI 33.60 kg/m2  SpO2 100%  HEMODYNAMICS: CVP:  [12 mmHg] 12 mmHg  VENTILATOR SETTINGS: Vent Mode:  [-] PSV;CPAP FiO2 (%):  [40 %] 40 % Set Rate:  [12 bmp] 12 bmp Vt Set:  [500 mL] 500 mL PEEP:  [5 cmH20] 5 cmH20 Pressure Support:  [14 cmH20] 14 cmH20 Plateau Pressure:  [15 cmH20-24 cmH20] 15 cmH20  INTAKE / OUTPUT: I/O last 3 completed shifts: In: 3359.1 [I.V.:2969.1; NG/GT:390] Out: 885 [Urine:685; Emesis/NG output:200]  PHYSICAL EXAMINATION:  Gen: sedated on vent, arouses to voice, follows simple commands HENT: NCAT ETT in place.   PULM: fair ae. Bibasilar crackles CV: RRR , (-) gallop noted, systolic murmur GI: BS+, soft, nontender MSK: normal bulk and tone. Gr 2 edema Neuro: sedated on vent, arouses to  voice,  follows commands  LABS:  BMET  Recent Labs Lab 07/13/15 0500 07/14/15 0431 07/15/15 0430  NA 152* 147* 148*  K 4.5 4.8 5.0  CL 113* 111 113*  CO2 27 24 21*  BUN 74* 84* 94*  CREATININE 3.29* 3.81* 4.28*  GLUCOSE 149* 151* 106*    Electrolytes  Recent Labs Lab 07/13/15 0500 07/14/15 0431 07/15/15 0430  CALCIUM 9.1 9.2 9.3    CBC  Recent Labs Lab 07/13/15 1545 07/14/15 0431 07/15/15 0430  WBC 14.0* 9.4 9.9  HGB 10.4* 9.7* 9.9*  HCT 32.8* 31.5* 32.9*  PLT 207 208 236    Coag's  Recent Labs Lab 07/09/2015 1848  07/13/15 0500 07/14/15 0431 07/15/15 0430  APTT 39*  --   --   --   --   INR 1.76*  < > 1.54* 1.84* 2.49*  < > = values in this interval not displayed.  Sepsis Markers No results for input(s): LATICACIDVEN, PROCALCITON, O2SATVEN in the last 168 hours.  ABG  Recent Labs Lab 07/10/15 1020  PHART 7.404  PCO2ART 46.8*  PO2ART 190*    Liver Enzymes No results for input(s): AST, ALT, ALKPHOS, BILITOT, ALBUMIN in the last 168 hours.  Cardiac Enzymes No results for input(s): TROPONINI, PROBNP in the last 168 hours.  Glucose  Recent Labs Lab 07/14/15 1138 07/14/15 1714 07/14/15 2018 07/15/15 0009 07/15/15 0448 07/15/15 0759  GLUCAP 142* 113* 122* 96 100* 91    Imaging No results  found.   STUDIES:  4/6 TEE severe MR 4/7 RHC And Coronary angiography Distal LAD , 30% stenosed, proximal Circumflex lesion 405 stenosed, proximal circumflex to mid circumflex 50% stenosed,1st Mrg lesion, 75% stenosed,1st RPLB lesion, 99% stenosed. 4/11 Bronchoscopy per Dr. Nelda Marseille "right mainstem hemorrhage" 4/18 Bronchoscopy > old blood in RLL  CULTURES: 4/11 BC >> (-) 4/11 sputum>> nml flora 4/11 urine>> e coli  4/17 resp > candida albicans  ANTIBIOTICS:  4/11 zosyn>> d/c  SIGNIFICANT EVENTS:  LINES/TUBES: PICC 4/8>>> 4/11 ET>>>  DISCUSSION: 65 YO female with past medical Hx of  ischemic cardiomyopathy, stroke, coronary artery  disease, lymphoma, CHF  S/p ICD placement ,HTN,HLD, Venticular tachycardia,chronic kidney disease -stage 3, depression, GERD, NSTEMI, had repiratory arrested,intubated and currently on vent.  Remains in renal failure and had hemoptysis overnight on 4/17 in setting of worsening hemoptysis.  Was noted to have bleeding on bronchoscopy on 4/11, recurrent in setting of elevated INR 4/17.  ASSESSMENT / PLAN:  PULMONARY A: Acute Hypoxemic respiratory failure 2/2 to pulm edema/unable to protect airway.  Possible LLung HCAP  Hemoptysis 4/16 in setting of elevated INR > resolved, uncertain etiology P:   Diuresis on hold 2/2 rising creat Wean PEEP/FiO2 today Duoneb/albuterol Zosyn has been dcd > will observe.  Daily PST  CARDIOVASCULAR A:  NSTEMI Acute on Chronic systolic CHF-EF 123456, severe MR.  Acute CHF exacerbation, systolic.  Hx of CAD, S/P stenting Severe Pulm HTN PAF P:  Diuresis on hold 2/2 elevated creat Continue amiodarone per cardiology   RENAL A:   AKI/Chronic kidney disease -stage 3 Hypernatremia  P:   Monitor BMET and UOP Replace electrolytes as needed Lasix per cardiology > on hold 2/2 to AKI  GASTROINTESTINAL A:  Diarrhea C-DIFF- Antigen+, toxin -negative -repeat same GERD Hematochezia in setting of elevated INR UGIB > recent maroon colored stools and melena.  P:  TF per nutrition  Protonix drip  to continue. Cont sucralfate.  Appreciate GI recommendations. Agree with holding off on EGD or IR procedure/embolization. BP stable. Hb and hct stable. Will observe.    HEMATOLOGIC A:   Coagulopathy > due to warfarin Recent UGIB > EGD 4/18 blood in ulcerated duodenal bulb, visible vessel.  P:  SCD transfuse for Hb < 7.  Hold oral coumadin  INFECTIOUS A:   Aspiration PNA E coli UTI C diff colonizer P:   Zosyn has been dc'd.  Low threshold for starting abx.  Will observe off abx.   ENDOCRINE A:   DM Hypothyroidism P:   SSI coverage Levothyroxine  -via tube  NEUROLOGIC A:   Hx OF Depression/ anxiety Encephalopahty -- pt is not really waking up and not following commands, even off sedation.  P:   Cranial CT scan. May need MRI and Neuro evaln.  RASS goal -1 Hold paxil/ seroquel Continue fentanyl gtt per PAD protocol  FAMILY  - Updates: No family at bedisde.  - Inter-disciplinary family meet or Palliative Care meeting due by: 07/10/15    Critical Care time with this patient today : 32 minutes.  Monica Becton, MD 07/15/2015, 11:14 AM [place] [facility] Pager [phone] After 3 pm or if no answer, call [phone]

## 2015-07-16 ENCOUNTER — Inpatient Hospital Stay (HOSPITAL_COMMUNITY): Payer: Medicare Other

## 2015-07-16 LAB — CARBOXYHEMOGLOBIN
Carboxyhemoglobin: 1.9 % — ABNORMAL HIGH (ref 0.5–1.5)
Methemoglobin: 0.7 % (ref 0.0–1.5)
O2 SAT: 80.1 %
Total hemoglobin: 10.9 g/dL — ABNORMAL LOW (ref 12.0–16.0)

## 2015-07-16 LAB — GLUCOSE, CAPILLARY
GLUCOSE-CAPILLARY: 105 mg/dL — AB (ref 65–99)
GLUCOSE-CAPILLARY: 114 mg/dL — AB (ref 65–99)
Glucose-Capillary: 84 mg/dL (ref 65–99)
Glucose-Capillary: 85 mg/dL (ref 65–99)
Glucose-Capillary: 86 mg/dL (ref 65–99)
Glucose-Capillary: 87 mg/dL (ref 65–99)

## 2015-07-16 LAB — CBC
HCT: 31.8 % — ABNORMAL LOW (ref 36.0–46.0)
Hemoglobin: 9.5 g/dL — ABNORMAL LOW (ref 12.0–15.0)
MCH: 28.7 pg (ref 26.0–34.0)
MCHC: 29.9 g/dL — AB (ref 30.0–36.0)
MCV: 96.1 fL (ref 78.0–100.0)
PLATELETS: 237 10*3/uL (ref 150–400)
RBC: 3.31 MIL/uL — AB (ref 3.87–5.11)
RDW: 21.6 % — ABNORMAL HIGH (ref 11.5–15.5)
WBC: 9 10*3/uL (ref 4.0–10.5)

## 2015-07-16 LAB — PROTIME-INR
INR: 2.85 — AB (ref 0.00–1.49)
PROTHROMBIN TIME: 29.4 s — AB (ref 11.6–15.2)

## 2015-07-16 LAB — BASIC METABOLIC PANEL
Anion gap: 16 — ABNORMAL HIGH (ref 5–15)
BUN: 108 mg/dL — ABNORMAL HIGH (ref 6–20)
CHLORIDE: 110 mmol/L (ref 101–111)
CO2: 20 mmol/L — ABNORMAL LOW (ref 22–32)
Calcium: 9.5 mg/dL (ref 8.9–10.3)
Creatinine, Ser: 5.35 mg/dL — ABNORMAL HIGH (ref 0.44–1.00)
GFR calc non Af Amer: 8 mL/min — ABNORMAL LOW (ref >60)
GFR, EST AFRICAN AMERICAN: 9 mL/min — AB (ref >60)
Glucose, Bld: 92 mg/dL (ref 65–99)
Potassium: 5.1 mmol/L (ref 3.5–5.1)
Sodium: 146 mmol/L — ABNORMAL HIGH (ref 135–145)

## 2015-07-16 MED ORDER — VITAL HIGH PROTEIN PO LIQD
1000.0000 mL | ORAL | Status: DC
Start: 2015-07-16 — End: 2015-07-17
  Administered 2015-07-16 (×2): 1000 mL
  Administered 2015-07-17

## 2015-07-16 MED ORDER — FENTANYL CITRATE (PF) 100 MCG/2ML IJ SOLN: 100.0000 ug | INTRAMUSCULAR | Status: DC | PRN

## 2015-07-16 MED ORDER — DEXMEDETOMIDINE HCL IN NACL 200 MCG/50ML IV SOLN
0.0000 ug/kg/h | INTRAVENOUS | Status: DC
  Administered 2015-07-16 – 2015-07-17 (×4): 0.6 ug/kg/h via INTRAVENOUS
  Administered 2015-07-17: 0.3 ug/kg/h via INTRAVENOUS
  Filled 2015-07-16 (×5): qty 50

## 2015-07-16 MED ORDER — PRO-STAT SUGAR FREE PO LIQD
30.0000 mL | Freq: Two times a day (BID) | ORAL | Status: DC
  Administered 2015-07-16 – 2015-07-17 (×3): 30 mL
  Filled 2015-07-16 (×3): qty 30

## 2015-07-16 MED ORDER — FREE WATER
200.0000 mL | Status: DC
  Administered 2015-07-16 – 2015-07-17 (×5): 200 mL

## 2015-07-16 MED ORDER — FREE WATER: 200.0000 mL | Freq: Three times a day (TID) | Status: DC

## 2015-07-16 NOTE — Progress Notes (Addendum)
PULMONARY / CRITICAL CARE MEDICINE   Name: [name] Terry MRN: [id number] DOB: February 25, 1951    ADMISSION DATE:  07/16/2015 CONSULTATION DATE:  07/04/15  REFERRING MD:  Fanny Bien  CHIEF COMPLAINT:  Respiratory arrest  HISTORY OF PRESENT ILLNESS:   [name] Terry is 65 yo female with past medical history significant for ischemic cardiomyopathy, stroke, coronary artery disease, lymphoma, CHF  S/p ICD placement ,HTN,HLD, Venticular tachycardia,chronic kidney disease -stage 3, depression, GERD.  Patient presented to ED with chest pain and LHC was not indicative of PCI at the time and was medically managed.  Started on empiric milrinone 07/02/15 with presentation concerning for low output On 4/11 developed  respiratory arrest.  Patient never lost her pulse and was intubated , now on vent. Developed VF arrest 4/11 evening requiring multiple shocks.  Pt with complicated ICU -- After arrest, she was treated as asp pna +/- pulm edema. There was swelling of tongue subsequently which slowed done weaning. CHF service started seeing her and was diuresced.  Had pulmonary hemorrhage 2/2 elevated INR. Also with UGIB for which she was scoped.  Also had vomiting of coffee gound emesis latter part of this week. In the meantime, she remains "agitated: when fentanyl is stopped.  Cranial ct scan was (-) for acute process. She is D 12 on the vent.   SUBJECTIVE:  On PST > requiring high PS. Was "agitated" off sedation.  No further UGIB.   VITAL SIGNS: BP 138/86 mmHg  Pulse 78  Temp(Src) 98.5 F (36.9 C) (Oral)  Resp 16  Ht 5\' 5"  (1.651 m)  Wt 203 lb 14.8 oz (92.5 kg)  BMI 33.93 kg/m2  SpO2 100%  HEMODYNAMICS: CVP:  [12 mmHg-15 mmHg] 15 mmHg  VENTILATOR SETTINGS: Vent Mode:  [-] PSV;CPAP FiO2 (%):  [40 %] 40 % Set Rate:  [12 bmp] 12 bmp Vt Set:  [500 mL] 500 mL PEEP:  [5 cmH20] 5 cmH20 Pressure Support:  [5 cmH20-14 cmH20] 14 cmH20 Plateau Pressure:  [16 cmH20-22 cmH20] 16 cmH20  INTAKE / OUTPUT: I/O  last 3 completed shifts: In: 2676.7 [I.V.:2176.7; NG/GT:500] Out: 355 [Urine:355]  PHYSICAL EXAMINATION:  Gen: sedated on vent. Not following commands.  HENT: NCAT ETT in place.  PERRL, bilateral ceruminosis unable to visualize TM PULM: bibasilar rales CV: RRR ,  systolic murmur GI: BS+, soft, nontender MSK: normal bulk and tone. Gr 2+ edema Neuro: sedated on vent,   LABS:  BMET  Recent Labs Lab 07/14/15 0431 07/15/15 0430 07/16/15 0423  NA 147* 148* 146*  K 4.8 5.0 5.1  CL 111 113* 110  CO2 24 21* 20*  BUN 84* 94* 108*  CREATININE 3.81* 4.28* 5.35*  GLUCOSE 151* 106* 92    Electrolytes  Recent Labs Lab 07/14/15 0431 07/15/15 0430 07/16/15 0423  CALCIUM 9.2 9.3 9.5    CBC  Recent Labs Lab 07/14/15 0431 07/15/15 0430 07/16/15 0423  WBC 9.4 9.9 9.0  HGB 9.7* 9.9* 9.5*  HCT 31.5* 32.9* 31.8*  PLT 208 236 237    Coag's  Recent Labs Lab 07/09/2015 1848  07/14/15 0431 07/15/15 0430 07/16/15 0423  APTT 39*  --   --   --   --   INR 1.76*  < > 1.84* 2.49* 2.85*  < > = values in this interval not displayed.  Sepsis Markers No results for input(s): LATICACIDVEN, PROCALCITON, O2SATVEN in the last 168 hours.  ABG  Recent Labs Lab 07/10/15 1020  PHART 7.404  PCO2ART 46.8*  PO2ART 190*  Liver Enzymes No results for input(s): AST, ALT, ALKPHOS, BILITOT, ALBUMIN in the last 168 hours.  Cardiac Enzymes No results for input(s): TROPONINI, PROBNP in the last 168 hours.  Glucose  Recent Labs Lab 07/15/15 1214 07/15/15 1537 07/15/15 2014 07/15/15 2358 07/16/15 0434 07/16/15 0737  GLUCAP 94 136* 103* 87 86 84    Imaging Ct Head Wo Contrast  07/16/2015  CLINICAL DATA:  Initial evaluation for acute encephalopathy. EXAM: CT HEAD WITHOUT CONTRAST TECHNIQUE: Contiguous axial images were obtained from the base of the skull through the vertex without intravenous contrast. COMPARISON:  Prior CT from 04/20/2015. FINDINGS: Age-related cerebral  volume loss is similar to prior. Patchy hypodensity within the periventricular deep white matter most consistent with chronic small vessel ischemic disease, also similar. Remote lacunar infarct within the left thalamus. Encephalomalacia within the right operculum also consistent with remote right MCA territory infarct, also stable. Scattered vascular calcifications within the carotid siphons. No acute large vessel territory infarct. No acute intracranial hemorrhage. No mass lesion, midline shift, or mass effect. No hydrocephalus. No extra-axial fluid collection. Scalp soft tissues within normal limits. No acute abnormality about the orbits. Paranasal sinuses are clear. Small amount of layering fluid within the posterior nasopharynx. Bilateral mastoid effusions with opacity in the middle ear cavities. Calvarium intact. IMPRESSION: 1. No acute intracranial process. 2. Stable atrophy with chronic small vessel ischemic disease and remote infarcts as above. 3. Bilateral mastoid effusions with opacification of the middle ear cavities. Correlation with physical exam and symptomatology recommended for possible acute otomastoiditis. Electronically Signed   By: Jeannine Boga M.D.   On: 07/16/2015 03:20     STUDIES:  4/6 TEE severe MR 4/7 RHC And Coronary angiography Distal LAD , 30% stenosed, proximal Circumflex lesion 405 stenosed, proximal circumflex to mid circumflex 50% stenosed,1st Mrg lesion, 75% stenosed,1st RPLB lesion, 99% stenosed. 4/11 Bronchoscopy per Dr. Nelda Marseille "right mainstem hemorrhage" 4/18 Bronchoscopy > old blood in RLL 4/23 CT head>> No acute intracranial process, bilateral mastoid effusions with opacification of the middle ear cavities  CULTURES: 4/11 BC >> (-) 4/11 sputum>> nml flora 4/11 urine>> e coli  4/17 resp > candida albicans  ANTIBIOTICS:  4/11 zosyn>> d/c  SIGNIFICANT EVENTS:  LINES/TUBES: PICC 4/8>>> 4/11 ET>>>  DISCUSSION: 65 YO female with past medical Hx of   ischemic cardiomyopathy, stroke, coronary artery disease, lymphoma, CHF  S/p ICD placement ,HTN,HLD, Venticular tachycardia,chronic kidney disease -stage 3, depression, GERD, NSTEMI, had repiratory arrested,intubated and currently on vent.    Pt with complicated ICU -- After arrest, she was treated as asp pna +/- pulm edema. There was swelling of tongue subsequently which slowed done weaning. CHF service started seeing her and was diuresced.  Had pulmonary hemorrhage 2/2 elevated INR. Also with UGIB for which she was scoped. Received pRBC.  Also had vomiting of coffee gound emesis latter part of this week. In the meantime, she remains "agitated"  when fentanyl is stopped.  Cranial ct scan was (-) for acute process. She is D 12 on the vent.  Family says she was awake, oriented x 3 before the cardiac arrest.    ASSESSMENT / PLAN:  PULMONARY A: Acute Hypoxemic respiratory failure 2/2 to pulm edema/unable to protect airway.  Possible LLung HCAP  Hemoptysis 4/16 in setting of elevated INR > resolved, uncertain etiology P:   Diuresis on hold 2/2 rising creat Wean PEEP/FiO2 today Duoneb/albuterol Zosyn has been dcd > will observe.  Daily PST > pt still requires a lot of PS  during PST. Her mental status is also not significantly improved enough to safely extubate her. She is D12. Family wants everything done. I mentioned she will most likely need a trache and PEG.    CARDIOVASCULAR A:  NSTEMI Acute on Chronic systolic CHF-EF 123456, severe MR.  Acute CHF exacerbation, systolic.  Hx of CAD, S/P stenting Severe Pulm HTN PAF P:  Diuresis on hold 2/2 elevated creat Continue amiodarone per cardiology.    RENAL A:   AKI 2/2 recent volume depletion with UGIB and diuresis and diarrhea.  AKI/Chronic kidney disease -stage 3 Hypernatremia  P:   Monitor BMET and UOP Replace electrolytes as needed Lasix per cardiology > on hold 2/2 to AKI Add free water 200q4. Start TF as well.  D/C  colcrys If no improvement will consult renal in AM. She is pre renal now > plan to hydrate and observe Creat.    GASTROINTESTINAL A:  Diarrhea C-DIFF- Antigen+, toxin -negative -repeat same GERD Hematochezia in setting of elevated INR UGIB > recent maroon colored stools and melena.  P:  Restart TF per nutrition  Protonix BID. Cont sucralfate.  Appreciate GI recommendations. Agree with holding off on EGD or IR procedure/embolization. BP stable. Hb and hct stable. Will observe.    HEMATOLOGIC A:   Coagulopathy > due to warfarin Recent UGIB > EGD 4/18 blood in ulcerated duodenal bulb, visible vessel.  P:  SCD transfuse for Hb < 7.  Hold oral coumadin  INFECTIOUS A:   Aspiration PNA E coli UTI C diff colonizer P:   Zosyn has been dc'd.  Low threshold for starting abx.  Will observe off abx.   ENDOCRINE A:   DM Hypothyroidism P:   SSI coverage Levothyroxine -via tube  NEUROLOGIC A:   Hx OF Depression/ anxiety Encephalopahty -- pt is not really waking up and not following commands, even off sedation.  P:   Cranial CT scan no acute process, cannot get MRI due to ICD  Family said she was "mentally" normal prior to arrest.  RASS goal -1 Hold paxil/ seroquel Change fentanyl drip to precedex PAD protocol If no improvement will consult neuro tomorrow. May need EEG.    Lucious Groves, DO  [facility]- PGY-3 07/16/2015, 11:01 AM   I have discussed the case with the resident/APP Dr. Heber Trego  I agree with the resident/APP's  history, physical examination, assessment, and plans.  I have edited the above note and modified it according to our agreed history, physical examination, assessment and plan.   I have spent 33  minutes of critical care time with this patient today.  Family :Family updated at length today. Spoke to daughter.  Over all, I think this pt has poor prognosis with underlying CHF, CKD, previous CVA. She had a VF arrest. She was awake and alert on admission.  She is not waking up now. Not sure how she is neurologically. Her creat is elevated as well (prerenal).  Plan to hydrate some today, switch sedation and reassess mental status  next 24 hrs. May need Renal, Neuro, and Palliative Care consult in am.  If family wants everything done, she will need a trache and a PEG. D12 on the vent today.    Monica Becton, MD 07/16/2015, 12:43 PM [facility] Pager [phone] After 3 pm or if no answer, call [phone]

## 2015-07-16 NOTE — Progress Notes (Signed)
Brief Nutrition Note  Consult received for enteral/tube feeding initiation and management.  Adult Enteral Nutrition Protocol alreadyinitiated. TF order adjusted to reflect recommendations provided in follow-up note on 4/21.  RD to follow-up 4/24.  Admitting Dx: Shortness of breath [R06.02] Chest pain at rest [R07.9]  Body mass index is 33.93 kg/(m^2). Pt meets criteria for obesity based on current BMI.  Labs:   Recent Labs Lab 07/14/15 0431 07/15/15 0430 07/16/15 0423  NA 147* 148* 146*  K 4.8 5.0 5.1  CL 111 113* 110  CO2 24 21* 20*  BUN 84* 94* 108*  CREATININE 3.81* 4.28* 5.35*  CALCIUM 9.2 9.3 9.5  GLUCOSE 151* 106* 92    Clayton Bibles, MS, RD, LDN Pager: [phone] After Hours Pager: [phone]

## 2015-07-16 NOTE — Progress Notes (Signed)
Pt placed back on full vent support due to decreased pt effort.

## 2015-07-16 NOTE — Progress Notes (Signed)
Transported patient to and from CT with no complications.

## 2015-07-17 ENCOUNTER — Inpatient Hospital Stay (HOSPITAL_COMMUNITY): Payer: Medicare Other

## 2015-07-17 DIAGNOSIS — N170 Acute kidney failure with tubular necrosis: Secondary | ICD-10-CM

## 2015-07-17 DIAGNOSIS — R9401 Abnormal electroencephalogram [EEG]: Secondary | ICD-10-CM | POA: Insufficient documentation

## 2015-07-17 DIAGNOSIS — N179 Acute kidney failure, unspecified: Secondary | ICD-10-CM | POA: Insufficient documentation

## 2015-07-17 LAB — RENAL FUNCTION PANEL
ALBUMIN: 2 g/dL — AB (ref 3.5–5.0)
Anion gap: 13 (ref 5–15)
BUN: 118 mg/dL — AB (ref 6–20)
CO2: 20 mmol/L — ABNORMAL LOW (ref 22–32)
CREATININE: 6.03 mg/dL — AB (ref 0.44–1.00)
Calcium: 9.4 mg/dL (ref 8.9–10.3)
Chloride: 112 mmol/L — ABNORMAL HIGH (ref 101–111)
GFR calc Af Amer: 8 mL/min — ABNORMAL LOW (ref >60)
GFR, EST NON AFRICAN AMERICAN: 7 mL/min — AB (ref >60)
Glucose, Bld: 148 mg/dL — ABNORMAL HIGH (ref 65–99)
PHOSPHORUS: 6.4 mg/dL — AB (ref 2.5–4.6)
POTASSIUM: 5.2 mmol/L — AB (ref 3.5–5.1)
Sodium: 145 mmol/L (ref 135–145)

## 2015-07-17 LAB — CBC
HEMATOCRIT: 31.7 % — AB (ref 36.0–46.0)
HEMOGLOBIN: 9.4 g/dL — AB (ref 12.0–15.0)
MCH: 28.4 pg (ref 26.0–34.0)
MCHC: 29.7 g/dL — AB (ref 30.0–36.0)
MCV: 95.8 fL (ref 78.0–100.0)
Platelets: 240 10*3/uL (ref 150–400)
RBC: 3.31 MIL/uL — ABNORMAL LOW (ref 3.87–5.11)
RDW: 21.7 % — ABNORMAL HIGH (ref 11.5–15.5)
WBC: 6.5 10*3/uL (ref 4.0–10.5)

## 2015-07-17 LAB — URINE MICROSCOPIC-ADD ON

## 2015-07-17 LAB — CARBOXYHEMOGLOBIN
Carboxyhemoglobin: 1.1 % (ref 0.5–1.5)
Methemoglobin: 0.8 % (ref 0.0–1.5)
O2 Saturation: 64.1 %
Total hemoglobin: 14.7 g/dL (ref 12.0–16.0)

## 2015-07-17 LAB — APTT: aPTT: 59 seconds — ABNORMAL HIGH (ref 24–37)

## 2015-07-17 LAB — URINALYSIS, ROUTINE W REFLEX MICROSCOPIC
Glucose, UA: NEGATIVE mg/dL
Ketones, ur: 15 mg/dL — AB
Nitrite: POSITIVE — AB
Protein, ur: >300 mg/dL — AB
SPECIFIC GRAVITY, URINE: 1.024 (ref 1.005–1.030)
pH: 5 (ref 5.0–8.0)

## 2015-07-17 LAB — GLUCOSE, CAPILLARY
GLUCOSE-CAPILLARY: 137 mg/dL — AB (ref 65–99)
GLUCOSE-CAPILLARY: 141 mg/dL — AB (ref 65–99)
Glucose-Capillary: 106 mg/dL — ABNORMAL HIGH (ref 65–99)
Glucose-Capillary: 161 mg/dL — ABNORMAL HIGH (ref 65–99)

## 2015-07-17 LAB — OSMOLALITY, URINE: OSMOLALITY UR: 354 mosm/kg (ref 300–900)

## 2015-07-17 LAB — HEPATIC FUNCTION PANEL
ALK PHOS: 65 U/L (ref 38–126)
ALT: 20 U/L (ref 14–54)
AST: 24 U/L (ref 15–41)
Albumin: 2 g/dL — ABNORMAL LOW (ref 3.5–5.0)
BILIRUBIN DIRECT: 0.2 mg/dL (ref 0.1–0.5)
BILIRUBIN TOTAL: 0.7 mg/dL (ref 0.3–1.2)
Indirect Bilirubin: 0.5 mg/dL (ref 0.3–0.9)
Total Protein: 5.7 g/dL — ABNORMAL LOW (ref 6.5–8.1)

## 2015-07-17 LAB — CREATININE, URINE, RANDOM: Creatinine, Urine: 97.07 mg/dL

## 2015-07-17 LAB — SODIUM, URINE, RANDOM: SODIUM UR: 22 mmol/L

## 2015-07-17 LAB — PROTIME-INR
INR: 3.18 — AB (ref 0.00–1.49)
PROTHROMBIN TIME: 32 s — AB (ref 11.6–15.2)

## 2015-07-17 LAB — TRIGLYCERIDES: TRIGLYCERIDES: 150 mg/dL — AB (ref <150)

## 2015-07-17 MED ORDER — PROPOFOL 1000 MG/100ML IV EMUL
0.0000 ug/kg/min | INTRAVENOUS | Status: DC
  Administered 2015-07-17: 20 ug/kg/min via INTRAVENOUS
  Filled 2015-07-17: qty 100

## 2015-07-17 MED ORDER — LORAZEPAM 2 MG/ML IJ SOLN
1.0000 mg/h | INTRAVENOUS | Status: DC
  Administered 2015-07-17 (×2): 5 mg/h via INTRAVENOUS
  Administered 2015-07-18: 10 mg/h via INTRAVENOUS
  Administered 2015-07-18 (×2): 5 mg/h via INTRAVENOUS
  Administered 2015-07-19 (×2): 10 mg/h via INTRAVENOUS
  Filled 2015-07-17 (×8): qty 25

## 2015-07-17 MED ORDER — MORPHINE SULFATE 25 MG/ML IV SOLN
10.0000 mg/h | INTRAVENOUS | Status: DC
  Administered 2015-07-17: 2 mg/h via INTRAVENOUS
  Filled 2015-07-17: qty 10

## 2015-07-17 MED ORDER — FENTANYL CITRATE (PF) 100 MCG/2ML IJ SOLN
INTRAMUSCULAR | Status: AC
  Filled 2015-07-17: qty 4

## 2015-07-17 MED ORDER — VITAMIN K1 1 MG/0.5ML IJ SOLN
2.0000 mg | Freq: Once | INTRAMUSCULAR | Status: AC
  Administered 2015-07-17: 2 mg via SUBCUTANEOUS
  Filled 2015-07-17: qty 1

## 2015-07-17 MED ORDER — PANTOPRAZOLE SODIUM 40 MG PO PACK
40.0000 mg | PACK | Freq: Two times a day (BID) | ORAL | Status: DC
  Administered 2015-07-17: 40 mg

## 2015-07-17 MED ORDER — MIDAZOLAM HCL 2 MG/2ML IJ SOLN
INTRAMUSCULAR | Status: AC
  Filled 2015-07-17: qty 6

## 2015-07-17 MED ORDER — PANTOPRAZOLE SODIUM 40 MG PO TBEC
40.0000 mg | DELAYED_RELEASE_TABLET | Freq: Two times a day (BID) | ORAL | Status: DC
Start: 2015-07-17 — End: 2015-07-17

## 2015-07-17 MED ORDER — MORPHINE BOLUS VIA INFUSION
5.0000 mg | INTRAVENOUS | Status: DC | PRN
  Filled 2015-07-17: qty 20

## 2015-07-17 MED ORDER — LORAZEPAM BOLUS VIA INFUSION
2.0000 mg | INTRAVENOUS | Status: DC | PRN
  Filled 2015-07-17: qty 5

## 2015-07-17 MED ORDER — FREE WATER: 200.0000 mL | Freq: Three times a day (TID) | Status: DC

## 2015-07-17 NOTE — Procedures (Signed)
I have had extensive discussions with family sister and kids. We discussed patients current circumstances and organ failures. We also discussed patient's prior wishes under circumstances such as this. Family has decided to NOT perform resuscitation if arrest but to continue current medical support for now. We discussed the poor prognosis and likely poor quality of life. Family has decided to offer full comfort care. They are aware that the patient may be transferred to palliative care floor for continued comfort care needs. They have been fully updated on the process and expectations. Likely in am    Lavon Paganini. Titus Mould, MD, [place] Pgr: [facility]

## 2015-07-17 NOTE — Progress Notes (Signed)
PULMONARY / CRITICAL CARE MEDICINE   Name: [name] Terry MRN: [id number] DOB: [birth year]    ADMISSION DATE:  07/17/2015 CONSULTATION DATE:  07/04/15  REFERRING MD:  Fanny Bien  CHIEF COMPLAINT:  Respiratory arrest  HISTORY OF PRESENT ILLNESS:   [name] Terry is 65 yo female with past medical history significant for ischemic cardiomyopathy, stroke, coronary artery disease, lymphoma, CHF  S/p ICD placement ,HTN,HLD, Venticular tachycardia,chronic kidney disease -stage 3, depression, GERD.  Patient presented to ED with chest pain and LHC was not indicative of PCI at the time and was medically managed.  Started on empiric milrinone 07/02/15 with presentation concerning for low output On 4/11 developed  respiratory arrest.  Patient never lost her pulse and was intubated , now on vent. Developed VF arrest 4/11 evening requiring multiple shocks.  Pt with complicated ICU -- After arrest, she was treated as asp pna +/- pulm edema. There was swelling of tongue subsequently which slowed done weaning. CHF service started seeing her and was diuresced.  Had pulmonary hemorrhage 2/2 elevated INR. Also with UGIB for which she was scoped.  Also had vomiting of coffee gound emesis latter part of this week. In the meantime, she remains "agitated: when fentanyl is stopped.  Cranial ct scan was (-) for acute process. She is D 12 on the vent.   SUBJECTIVE:  Remains on vent No DAH NO GI bleed  VITAL SIGNS: BP 122/78 mmHg  Pulse 59  Temp(Src) 98 F (36.7 C) (Axillary)  Resp 24  Ht 5\' 5"  (1.651 m)  Wt 94.6 kg (208 lb 8.9 oz)  BMI 34.71 kg/m2  SpO2 100%  HEMODYNAMICS: CVP:  [14 mmHg] 14 mmHg  VENTILATOR SETTINGS: Vent Mode:  [-] PSV;CPAP FiO2 (%):  [40 %] 40 % Set Rate:  [12 bmp] 12 bmp Vt Set:  [500 mL] 500 mL PEEP:  [5 cmH20] 5 cmH20 Pressure Support:  [12 cmH20] 12 cmH20 Plateau Pressure:  [22 cmH20-24 cmH20] 24 cmH20  INTAKE / OUTPUT: I/O last 3 completed shifts: In: 3363 [I.V.:2125;  NG/GT:1238] Out: 275 [Urine:275]  PHYSICAL EXAMINATION:  Gen: sedated on vent. Not following commands. Purposeful with suctioning HENT: NCAT ETT in place.  PERRL, bilateral ceruminosis unable to visualize TM PULM: CTA CV: RRR ,  systolic murmur GI: BS+, soft, nontender MSK: normal bulk and tone. Gr 2+ edema Neuro: sedated on vent,   LABS:  BMET  Recent Labs Lab 07/15/15 0430 07/16/15 0423 07/17/15 0803  NA 148* 146* 145  K 5.0 5.1 5.2*  CL 113* 110 112*  CO2 21* 20* 20*  BUN 94* 108* 118*  CREATININE 4.28* 5.35* 6.03*  GLUCOSE 106* 92 148*    Electrolytes  Recent Labs Lab 07/15/15 0430 07/16/15 0423 07/17/15 0803  CALCIUM 9.3 9.5 9.4  PHOS  --   --  6.4*    CBC  Recent Labs Lab 07/15/15 0430 07/16/15 0423 07/17/15 0300  WBC 9.9 9.0 6.5  HGB 9.9* 9.5* 9.4*  HCT 32.9* 31.8* 31.7*  PLT 236 237 240    Coag's  Recent Labs Lab 07/16/2015 1848  07/15/15 0430 07/16/15 0423 07/17/15 0300  APTT 39*  --   --   --  59*  INR 1.76*  < > 2.49* 2.85* 3.18*  < > = values in this interval not displayed.  Sepsis Markers No results for input(s): LATICACIDVEN, PROCALCITON, O2SATVEN in the last 168 hours.  ABG No results for input(s): PHART, PCO2ART, PO2ART in the last 168 hours.  Liver Enzymes  Recent Labs Lab 07/17/15 0803 07/17/15 0804  AST  --  24  ALT  --  20  ALKPHOS  --  65  BILITOT  --  0.7  ALBUMIN 2.0* 2.0*    Cardiac Enzymes No results for input(s): TROPONINI, PROBNP in the last 168 hours.  Glucose  Recent Labs Lab 07/16/15 0737 07/16/15 1121 07/16/15 1604 07/16/15 2046 07/17/15 0018 07/17/15 0350  GLUCAP 84 105* 85 114* 137* 106*    Imaging Dg Chest Port 1 View  07/17/2015  CLINICAL DATA:  Respiratory failure. EXAM: PORTABLE CHEST 1 VIEW COMPARISON:  07/14/2015. FINDINGS: Endotracheal tube, NG tube, right PICC line in stable position. Cardiac pacer in stable position. Stable cardiomegaly. Diffuse bilateral pulmonary alveolar  infiltrates and/or edema, partially cleared from prior exam. Small left pleural effusion. No pneumothorax . IMPRESSION: 1. Lines and tubes in stable position. 2. Persistent but partially clearing bilateral pulmonary infiltrates and/or edema. 3. Cardiac pacer in stable position.  Stable cardiomegaly. Electronically Signed   By: Marcello Moores  Register   On: 07/17/2015 07:16     STUDIES:  4/6 TEE severe MR 4/7 RHC And Coronary angiography Distal LAD , 30% stenosed, proximal Circumflex lesion 405 stenosed, proximal circumflex to mid circumflex 50% stenosed,1st Mrg lesion, 75% stenosed,1st RPLB lesion, 99% stenosed. 4/11 Bronchoscopy per Dr. Nelda Marseille "right mainstem hemorrhage" 4/18 Bronchoscopy > old blood in RLL 4/23 CT head>> No acute intracranial process, bilateral mastoid effusions with opacification of the middle ear cavities  CULTURES: 4/11 BC >> (-) 4/11 sputum>> nml flora 4/11 urine>> e coli  4/17 resp > candida albicans  ANTIBIOTICS:  4/11 zosyn>> d/c  SIGNIFICANT EVENTS:  LINES/TUBES: PICC 4/8>>> 4/11 ET>>>  DISCUSSION: 65 YO female with past medical Hx of  ischemic cardiomyopathy, stroke, coronary artery disease, lymphoma, CHF  S/p ICD placement ,HTN,HLD, Venticular tachycardia,chronic kidney disease -stage 3, depression, GERD, NSTEMI, had repiratory arrested,intubated and currently on vent.    Pt with complicated ICU -- After arrest, she was treated as asp pna +/- pulm edema. There was swelling of tongue subsequently which slowed done weaning. CHF service started seeing her and was diuresced.  Had pulmonary hemorrhage 2/2 elevated INR. Also with UGIB for which she was scoped. Received pRBC.  Also had vomiting of coffee gound emesis latter part of this week. In the meantime, she remains "agitated"  when fentanyl is stopped.  Cranial ct scan was (-) for acute process. She is D 12 on the vent.  Family says she was awake, oriented x 3 before the cardiac arrest.    ASSESSMENT /  PLAN:  PULMONARY A: Acute Hypoxemic respiratory failure 2/2 to pulm edema/unable to protect airway.  Possible LLung HCAP  Hemoptysis 4/16 in setting of elevated INR > resolved, uncertain etiology P:   Diuresis on hold 2/2 rising creat Wean PEEP/FiO2 today PS 14 required , proceed with trach Duoneb/albuterol pcxr noted, neg balance as able  CARDIOVASCULAR A:  NSTEMI Acute on Chronic systolic CHF-EF 123456, severe MR.  Acute CHF exacerbation, systolic.  Hx of CAD, S/P stenting Severe Pulm HTN PAF P:  Dc precedex, paced on it Tele   RENAL A:   AKI 2/2 recent volume depletion with UGIB and diuresis and diarrhea.  AKI/Chronic kidney disease -stage 3 Hypernatremia  P:   Ensure UA, renal imaging, urine osm, na 1/2 NS at 100 cc , hr, follow trend Continue amiodarone per cardiology. bmet q12h    GASTROINTESTINAL A:  Diarrhea C-DIFF- Antigen+, toxin -negative -repeat same GERD Hematochezia in setting  of elevated INR UGIB > recent maroon colored stools and melena.  P:  ppi drip to bid feeds  HEMATOLOGIC A:   Coagulopathy > due to warfarin Recent UGIB > EGD 4/18 blood in ulcerated duodenal bulb, visible vessel.  P:  SCD transfuse for Hb < 7.  Hold oral coumadin will need family meeting to discuss trach vs comfort, trach? Reverse ?  Vit K 2 mg x 1 for now, coags in am   INFECTIOUS A:   Aspiration PNA E coli UTI C diff colonizer P:   UA Monitor temp  ENDOCRINE A:   DM Hypothyroidism P:   SSI coverage Levothyroxine -via tube  NEUROLOGIC A:   Hx OF Depression/ anxiety Encephalopahty -- pt is not really waking up and not following commands, even off sedation.  P:   Needs EEG May need MRI - may not be able with pacer Ct reviewed Transition off precedex, cosnider pop  Ccm time 35 min   Lavon Paganini. Titus Mould, MD, [employer] Pgr: [facility]

## 2015-07-17 NOTE — Progress Notes (Signed)
Advanced Heart Failure Rounding Note  PCP: Karna Dupes, PA-C Primary Cardiologist: Johnsie Cancel EP: Allred  Subjective:   This is a 65 year old female patient with h/o of CAD, HTN, ICM s/p MDT dual chamber ICD 2013, lymphoma s/p chemo/radiation 2353, chronic systolic CHF, status post VT ablation 2, HTN, HLD, VT, DM, CKD stage III, PMR and depression who presented to [facility] with CP and SOB  Started on empiric milrinone 07/02/15 with presentation concerning for low output.  PICC placed. On 4/11 developed respiratory distress requiring intubation and bronchoscopy. After intubation she was hypotensive and started on dopamine and continued on milrinone. Unfortunately later that evening she had V fib arrest multiple shocks. Milrinone and dopamine stopped. Started on lidocaine and amio drip.   Over weekend of 4/15-4/16 had pulmonary hemorrhage. On 4/18 had GI bleed requiring multiple blood products. Had EGD with duodenal ulcers with visible vessel. Unable to treat fully. On protonix.  Remains sedated on vent. Does not respond to commands.   Per Critical care she will most likely need Trach and PEG.  Lasix on hold with elevated Cr, which continued to climb over weekend 3.81 -> 4.28 -> 5.35. Creatinine pending for today.  Coox 64%.  Hgb trending down slightly 9.9 -> 9.5 -> 9.4  Objective:   Weight Range: 208 lb 8.9 oz (94.6 kg) Body mass index is 34.71 kg/(m^2).   Vital Signs:   Temp:  [97.1 F (36.2 C)-98.1 F (36.7 C)] 97.1 F (36.2 C) (04/24 0345) Pulse Rate:  [58-133] 59 (04/24 0700) Resp:  [9-20] 12 (04/24 0700) BP: (100-152)/(65-101) 119/76 mmHg (04/24 0700) SpO2:  [99 %-100 %] 100 % (04/24 0700) FiO2 (%):  [40 %] 40 % (04/24 0400) Weight:  [208 lb 8.9 oz (94.6 kg)] 208 lb 8.9 oz (94.6 kg) (04/24 0345) Last BM Date: 07/13/15  Weight change: Filed Weights   07/15/15 0400 07/16/15 0400 07/17/15 0345  Weight: 201 lb 15.1 oz (91.6 kg) 203 lb 14.8 oz (92.5 kg) 208 lb 8.9 oz (94.6 kg)     Intake/Output:   Intake/Output Summary (Last 24 hours) at 07/17/15 0721 Last data filed at 07/17/15 0700  Gross per 24 hour  Intake 2556.64 ml  Output    215 ml  Net 2341.64 ml     Physical Exam: CVP 10-11 General: Intubated. Sedated HEENT: ETT in tact. Neck: supple. Carotids 2+ bilat; no bruits. No thyromegaly or nodule noted. Cor: PMI nondisplaced. RRR. No rubs or gallops. MR murmur Lungs: Coarse breath sounds throughout Abdomen: soft, NT, ND, no HSM. No bruits or masses. +BS  Extremities: no cyanosis, clubbing, rash, Trace ankle edema. Trace edema in arms. R and LLE SCDs.  Neuro: Intubated agitated. Not following commands.  GU: Foley.   Telemetry: reviewed,  NSR 60s    Labs: CBC  Recent Labs  07/16/15 0423 07/17/15 0300  WBC 9.0 6.5  HGB 9.5* 9.4*  HCT 31.8* 31.7*  MCV 96.1 95.8  PLT 237 614   Basic Metabolic Panel  Recent Labs  07/15/15 0430 07/16/15 0423  NA 148* 146*  K 5.0 5.1  CL 113* 110  CO2 21* 20*  GLUCOSE 106* 92  BUN 94* 108*  CREATININE 4.28* 5.35*  CALCIUM 9.3 9.5   Liver Function Tests No results for input(s): AST, ALT, ALKPHOS, BILITOT, PROT, ALBUMIN in the last 72 hours. No results for input(s): LIPASE, AMYLASE in the last 72 hours. Cardiac Enzymes No results for input(s): CKTOTAL, CKMB, CKMBINDEX, TROPONINI in the last 72 hours.  BNP:  BNP (last 3 results)  Recent Labs  02/21/15 1042 03/01/15 0839 04/13/15 2215  BNP 386.1* 452.0* 894.0*    ProBNP (last 3 results) No results for input(s): PROBNP in the last 8760 hours.   D-Dimer No results for input(s): DDIMER in the last 72 hours. Hemoglobin A1C No results for input(s): HGBA1C in the last 72 hours. Fasting Lipid Panel No results for input(s): CHOL, HDL, LDLCALC, TRIG, CHOLHDL, LDLDIRECT in the last 72 hours. Thyroid Function Tests No results for input(s): TSH, T4TOTAL, T3FREE, THYROIDAB in the last 72 hours.  Invalid input(s): FREET3  Other  results:     Imaging/Studies:  Ct Head Wo Contrast  07/16/2015  CLINICAL DATA:  Initial evaluation for acute encephalopathy. EXAM: CT HEAD WITHOUT CONTRAST TECHNIQUE: Contiguous axial images were obtained from the base of the skull through the vertex without intravenous contrast. COMPARISON:  Prior CT from 04/20/2015. FINDINGS: Age-related cerebral volume loss is similar to prior. Patchy hypodensity within the periventricular deep white matter most consistent with chronic small vessel ischemic disease, also similar. Remote lacunar infarct within the left thalamus. Encephalomalacia within the right operculum also consistent with remote right MCA territory infarct, also stable. Scattered vascular calcifications within the carotid siphons. No acute large vessel territory infarct. No acute intracranial hemorrhage. No mass lesion, midline shift, or mass effect. No hydrocephalus. No extra-axial fluid collection. Scalp soft tissues within normal limits. No acute abnormality about the orbits. Paranasal sinuses are clear. Small amount of layering fluid within the posterior nasopharynx. Bilateral mastoid effusions with opacity in the middle ear cavities. Calvarium intact. IMPRESSION: 1. No acute intracranial process. 2. Stable atrophy with chronic small vessel ischemic disease and remote infarcts as above. 3. Bilateral mastoid effusions with opacification of the middle ear cavities. Correlation with physical exam and symptomatology recommended for possible acute otomastoiditis. Electronically Signed   By: Jeannine Boga M.D.   On: 07/16/2015 03:20   Dg Chest Port 1 View  07/17/2015  CLINICAL DATA:  Respiratory failure. EXAM: PORTABLE CHEST 1 VIEW COMPARISON:  07/14/2015. FINDINGS: Endotracheal tube, NG tube, right PICC line in stable position. Cardiac pacer in stable position. Stable cardiomegaly. Diffuse bilateral pulmonary alveolar infiltrates and/or edema, partially cleared from prior exam. Small left  pleural effusion. No pneumothorax . IMPRESSION: 1. Lines and tubes in stable position. 2. Persistent but partially clearing bilateral pulmonary infiltrates and/or edema. 3. Cardiac pacer in stable position.  Stable cardiomegaly. Electronically Signed   By: Marcello Moores  Register   On: 07/17/2015 07:16    Latest Echo  Latest Cath   Medications:     Scheduled Medications: . allopurinol  100 mg Per Tube Daily  . antiseptic oral rinse  7 mL Mouth Rinse 10 times per day  . aspirin  81 mg Per Tube Daily  . chlorhexidine gluconate (SAGE KIT)  15 mL Mouth Rinse BID  . feeding supplement (PRO-STAT SUGAR FREE 64)  30 mL Per Tube BID  . feeding supplement (VITAL HIGH PROTEIN)  1,000 mL Per Tube Q24H  . ferrous sulfate  300 mg Per Tube BID WC  . free water  200 mL Per Tube Q4H  . insulin aspart  0-9 Units Subcutaneous 6 times per day  . levothyroxine  100 mcg Per Tube QAC breakfast  . sodium chloride flush  10-40 mL Intracatheter Q12H  . sucralfate  1 g Oral TID AC & HS    Infusions: . sodium chloride 0 mL/hr at 07/14/15 1900  . amiodarone 30 mg/hr (07/17/15 0615)  . dexmedetomidine  0.6 mcg/kg/hr (07/17/15 0400)  . norepinephrine (LEVOPHED) Adult infusion Stopped (07/05/2015 1230)  . pantoprozole (PROTONIX) infusion 8 mg/hr (07/17/15 0555)    PRN Medications: [DISCONTINUED] acetaminophen **OR** acetaminophen, acetaminophen, albuterol, diphenhydrAMINE **OR** diphenhydrAMINE, fentaNYL (SUBLIMAZE) injection, fentaNYL (SUBLIMAZE) injection, hydrALAZINE, midazolam, nitroGLYCERIN, ondansetron (ZOFRAN) IV, ondansetron **OR** [DISCONTINUED] ondansetron (ZOFRAN) IV, promethazine, sodium chloride flush   Assessment   1. Respiratory Arrest 2. VF Arrest 3.Acute on chronic combined CHF - EF 20-25% w/ diffuse HK and akinesis of the inferolateral and inferior myocardium, G2DD, severe MR, mild RV dysfunction/dilation, + atrial septal anuerysm 4. Severe MR - by TEE 5.  Severe Pulm HTN - PA pressure 90 mm  Hg 6.  PAF---A fib over ngiht.  7. AKI on CKD stage IV  8. Chronic anemia - workup in 03/2014 unremarkable GI bleed. 9. DM2 10. HLD 11. Hypokalemia 12. LCB-  No external shocks. [place] for meds and drugs.  13. Hemoptysis/pulmonary hemorrhage 14. GI bleed  15. CAD   Plan    Remains intubated and sedated.  Received multiple blood products for GI bleed. Had EGD with duodenal ulcers noted. On protonix. Hgb trending down over weekend.  9.4 this am.   Co-ox 64% . Remains in NSR on amio drip at 30 mg per hour. Off anticoagulation with GI bleed.    Renal function continues to trend up. BMET bending for this am. CVP 10-11. She still is not peeing despite hydration yesterday. CCM to consult renal. Will continue to hold diuretics for now in light of this.     We will, however, hold free water.   INR 3.18, trending up without anticoags on board.   Length of Stay: [address]  Shirley Friar PA-C  07/17/2015, 7:21 AM  Advanced Heart Failure Team Pager [phone] (M-F; 7a - 4p)  Please contact [facility] for night-coverage after hours (4p -7a ) and weekends on amion  Patient seen with PA, creatinine up to 6 with BUN 118.  CVP 11-12, co-ox adequate.  Does not need inotrope.  At this point, think we will need nephrology service involved.   Continue amiodarone gtt with recent vfib arrest.   Loralie Champagne 07/17/2015 12:22 PM

## 2015-07-17 NOTE — Progress Notes (Signed)
EEG completed; results pending.    

## 2015-07-17 NOTE — Progress Notes (Signed)
Nutrition Brief Note  Chart reviewed. Pt now transitioning to comfort care.  No further nutrition interventions warranted at this time.  Please re-consult as needed.   Lyndon Chapel RD, LDN, CNSC [phone] Pager [phone] After Hours Pager    

## 2015-07-17 NOTE — Progress Notes (Signed)
   07/17/15 1300  Clinical Encounter Type  Visited With Family  Visit Type Spiritual support  Referral From Palliative care team  Spiritual Encounters  Spiritual Needs Prayer  Stress Factors  Family Stress Factors Loss  Chaplain visited with brother of patient at bedside, prayed for his sister, whom they call [name]. After prayer, brother sang beautiful hymn to sister. Left him with instructions to call us if we can be of any assistance or if the rest of the family desires prayer.

## 2015-07-17 NOTE — Procedures (Signed)
HPI:  65 y/o s/p VF arrest, now with encephalopathy.  TECHNICAL SUMMARY:  A multichannel referential and bipolar montage EEG using the standard international 10-20 system was performed on the patient described as unresponsive.  There is no occipital dominant rhythm.  There is electrocortical suppression throughout much of the recording.  Some of the recording has a burst suppression pattern with one to 3 seconds of burst activity, followed by a maximum of 9 seconds of suppression of electrocortical activity.  ACTIVATION:  Stepwise photic stimulation and hyperventilation are not performed.  EPILEPTIFORM ACTIVITY:  There were no spikes, sharp waves or paroxysmal activity.  SLEEP:  No sleep  CARDIAC:  The EKG lead revealed a regular sinus rhythm.  IMPRESSION:  This is an abnormal EEG demonstrating evidence of electrocortical suppression, which may be due to the fact that the patient is on propofol during the recording.  Some of the recording showed evidence of burst-suppression activity, but in the face of sedating medications, no conclusions can be made regarding prognosis.  It may be beneficial to repeat this EEG in the future, if the patient is able to get off of sedation.  There was no evidence of epileptiform activity on this recording.  Correlate clinically.

## 2015-07-18 DIAGNOSIS — R0789 Other chest pain: Secondary | ICD-10-CM

## 2015-07-18 NOTE — Progress Notes (Signed)
RT note- Patient was extubated per withdrawal orders.

## 2015-07-18 NOTE — Progress Notes (Signed)
PULMONARY / CRITICAL CARE MEDICINE   Name: [name] Terry MRN: [id number] DOB: Oct 30, 1950    ADMISSION DATE:  06/25/2015 CONSULTATION DATE:  07/04/15  REFERRING MD:  Fanny Bien  CHIEF COMPLAINT:  Respiratory arrest  HISTORY OF PRESENT ILLNESS:   [name] Terry is 65 yo female with past medical history significant for ischemic cardiomyopathy, stroke, coronary artery disease, lymphoma, CHF  S/p ICD placement ,HTN,HLD, Venticular tachycardia,chronic kidney disease -stage 3, depression, GERD.  Patient presented to ED with chest pain and LHC was not indicative of PCI at the time and was medically managed.  Started on empiric milrinone 07/02/15 with presentation concerning for low output On 4/11 developed  respiratory arrest.  Patient never lost her pulse and was intubated , now on vent. Developed VF arrest 4/11 evening requiring multiple shocks.  Pt with complicated ICU -- After arrest, she was treated as asp pna +/- pulm edema. There was swelling of tongue subsequently which slowed done weaning. CHF service started seeing her and was diuresced.  Had pulmonary hemorrhage 2/2 elevated INR. Also with UGIB for which she was scoped.  Also had vomiting of coffee gound emesis latter part of this week. In the meantime, she remains "agitated: when fentanyl is stopped.  Cranial ct scan was (-) for acute process. She is D 12 on the vent.   SUBJECTIVE:  On comfort  Meds, family at bedside  VITAL SIGNS: BP 95/63 mmHg  Pulse 59  Temp(Src) 98.2 F (36.8 C) (Oral)  Resp 21  Ht 5\' 5"  (1.651 m)  Wt 94.6 kg (208 lb 8.9 oz)  BMI 34.71 kg/m2  SpO2 99%  HEMODYNAMICS:    VENTILATOR SETTINGS: Vent Mode:  [-] PRVC FiO2 (%):  [40 %] 40 % Set Rate:  [12 bmp] 12 bmp Vt Set:  [500 mL] 500 mL PEEP:  [5 cmH20] 5 cmH20 Pressure Support:  [12 cmH20] 12 cmH20 Plateau Pressure:  [15 cmH20-25 cmH20] 24 cmH20  INTAKE / OUTPUT: I/O last 3 completed shifts: In: 2193.3 [I.V.:975.3; NG/GT:1218] Out: 275  [Urine:275]  PHYSICAL EXAMINATION:  Gen: sedated no response HENT: obese PULM: coarse CV: RRR ,  systolic murmur GI: BS+, soft, nontender MSK: normal bulk and tone. Gr 2+ edema Neuro: sedated on vent   LABS:  BMET  Recent Labs Lab 07/15/15 0430 07/16/15 0423 07/17/15 0803  NA 148* 146* 145  K 5.0 5.1 5.2*  CL 113* 110 112*  CO2 21* 20* 20*  BUN 94* 108* 118*  CREATININE 4.28* 5.35* 6.03*  GLUCOSE 106* 92 148*    Electrolytes  Recent Labs Lab 07/15/15 0430 07/16/15 0423 07/17/15 0803  CALCIUM 9.3 9.5 9.4  PHOS  --   --  6.4*    CBC  Recent Labs Lab 07/15/15 0430 07/16/15 0423 07/17/15 0300  WBC 9.9 9.0 6.5  HGB 9.9* 9.5* 9.4*  HCT 32.9* 31.8* 31.7*  PLT 236 237 240    Coag's  Recent Labs Lab 07/05/2015 1848  07/15/15 0430 07/16/15 0423 07/17/15 0300  APTT 39*  --   --   --  59*  INR 1.76*  < > 2.49* 2.85* 3.18*  < > = values in this interval not displayed.  Sepsis Markers No results for input(s): LATICACIDVEN, PROCALCITON, O2SATVEN in the last 168 hours.  ABG No results for input(s): PHART, PCO2ART, PO2ART in the last 168 hours.  Liver Enzymes  Recent Labs Lab 07/17/15 0803 07/17/15 0804  AST  --  24  ALT  --  20  ALKPHOS  --  65  BILITOT  --  0.7  ALBUMIN 2.0* 2.0*    Cardiac Enzymes No results for input(s): TROPONINI, PROBNP in the last 168 hours.  Glucose  Recent Labs Lab 07/16/15 1604 07/16/15 2046 07/17/15 0018 07/17/15 0350 07/17/15 0741 07/17/15 1206  GLUCAP 85 114* 137* 106* 141* 161*    Imaging No results found.   STUDIES:  4/6 TEE severe MR 4/7 RHC And Coronary angiography Distal LAD , 30% stenosed, proximal Circumflex lesion 405 stenosed, proximal circumflex to mid circumflex 50% stenosed,1st Mrg lesion, 75% stenosed,1st RPLB lesion, 99% stenosed. 4/11 Bronchoscopy per Dr. Nelda Marseille "right mainstem hemorrhage" 4/18 Bronchoscopy > old blood in RLL 4/23 CT head>> No acute intracranial process, bilateral  mastoid effusions with opacification of the middle ear cavities  CULTURES: 4/11 BC >> (-) 4/11 sputum>> nml flora 4/11 urine>> e coli  4/17 resp > candida albicans  ANTIBIOTICS:  4/11 zosyn>> d/c  SIGNIFICANT EVENTS:  LINES/TUBES: PICC 4/8>>> 4/11 ET>>>  DISCUSSION: 65 YO female with past medical Hx of  ischemic cardiomyopathy, stroke, coronary artery disease, lymphoma, CHF  S/p ICD placement ,HTN,HLD, Venticular tachycardia,chronic kidney disease -stage 3, depression, GERD, NSTEMI, had repiratory arrested,intubated and currently on vent.    Pt with complicated ICU -- After arrest, she was treated as asp pna +/- pulm edema. There was swelling of tongue subsequently which slowed done weaning. CHF service started seeing her and was diuresced.  Had pulmonary hemorrhage 2/2 elevated INR. Also with UGIB for which she was scoped. Received pRBC.  Also had vomiting of coffee gound emesis latter part of this week. In the meantime, she remains "agitated"  when fentanyl is stopped.  Cranial ct scan was (-) for acute process. She is D 12 on the vent.  Family says she was awake, oriented x 3 before the cardiac arrest.    ASSESSMENT / PLAN:  PULMONARY A: Acute Hypoxemic respiratory failure 2/2 to pulm edema/unable to protect airway.  Possible LLung HCAP  Hemoptysis 4/16 in setting of elevated INR > resolved, uncertain etiology Comfort care P:   Morphine noted at 5 mg, with interruption to cpap 5ps5, for 30 sec, we see elevated rr -wil lbolus morphine and increase drip by 5 mg/hr until rr 12-18 -ensure rate set 12 fixed back up -versed ma need to increase as severe anxiety reported by family - ii have updated family at bedside -no feeding   Ccm time 30 min   Lavon Paganini. Titus Mould, MD, [place] Pgr: [facility]

## 2015-07-19 MED ORDER — SCOPOLAMINE 1 MG/3DAYS TD PT72
1.0000 | MEDICATED_PATCH | TRANSDERMAL | Status: DC
  Administered 2015-07-19: 1.5 mg via TRANSDERMAL
  Filled 2015-07-19: qty 1

## 2015-07-19 MED ORDER — SODIUM CHLORIDE 0.9% FLUSH: 10.0000 mL | INTRAVENOUS | Status: DC | PRN

## 2015-07-24 NOTE — Progress Notes (Signed)
Notified of patients death per Claretha Cooper, RN.     Noe Gens, NP-C [facility] Pgr: [phone] or if no answer [phone] Jul 24, 2015, 11:21 AM

## 2015-07-24 NOTE — Care Management Important Message (Signed)
Important Message  Patient Details  Name: [name] Terry MRN: [id number] Date of Birth: [birth year]   Medicare Important Message Given:  Yes    Jayme Mednick, Antony Haste, RN 10-Aug-2015, 10:49 AM

## 2015-07-24 NOTE — Progress Notes (Signed)
IV drips wasted with Joslyn Hy, Rn. Morphine 100cc, and 26ml left for ativan.

## 2015-07-24 DEATH — deceased

## 2015-07-27 ENCOUNTER — Telehealth: Payer: Self-pay

## 2015-07-27 NOTE — Telephone Encounter (Signed)
On 08-19-2015 I received a death certificate from [facility] (original). The death certificate is for burial. The patient is a patient of Doctor Titus Mould. The death certificate will be taken to [facility] [id number] [facility] this am for signature. On 08/19/15 I received the death certificate back from Doctor Titus Mould. I got the death certificate ready and mailed the death certificate to the [place] home per their request.

## 2015-08-03 LAB — FUNGUS CULTURE RESULT

## 2015-08-03 LAB — FUNGUS CULTURE WITH STAIN

## 2015-08-03 LAB — FUNGAL ORGANISM REFLEX

## 2015-08-24 NOTE — Discharge Summary (Signed)
NAMELAVONE, SCHNITZER NO.:  [id number]  MEDICAL RECORD NO.:  [id number]  LOCATION:  5W01C                        FACILITY:  [facility]  PHYSICIAN:  Raylene Miyamoto, MD DATE OF BIRTH:  01-01-1951  DATE OF ADMISSION:  06/25/2015 DATE OF DISCHARGE:  Aug 03, 2015                              DISCHARGE SUMMARY   DEATH SUMMARY  This is a 65 year old female with past medical history significant for ischemic cardiomyopathy, stroke, coronary artery disease, lymphoma, CHF, status post ICD placement, hypertension, hyperlipidemia, ventricular tachycardia, chronic kidney disease stage 3, depression, GERD who presented to the emergency room with chest pain, not indicative of cardiac ischemia.  She was started empirically on milrinone on July 02, 2015, with presenting concerns of low output cardiogenic shock.  On July 04, 2015, she developed respiratory arrest and never lost a pulse, was intubated, taken on the ventilator machine, developed ventricular fibrillation arrest on July 04, 2015, requiring multiple shocks.  During her hospital course, she was significant for cardiac arrest as stated above, aspiration pneumonia, pulmonary edema.  There was swelling of the tongue subsequently, this was improved.  She was doing some weaning, and the Heart Failure Service was being participating in her diuretics and heart failure management.  She had pulmonary hemorrhage secondary to elevated INR.  She also had upper GI bleed for which she underwent endoscopy.  She had some vomiting coffee-ground emesis, seen by Dr. Michail Sermon.  She had severe agitation, delirium on the ventilator machine.  She failed to progress whatsoever.  She had horrible neurologic status most likely secondary to delirium and encephalopathy.  Discussions with the daughters noted her prior wishes were not to be continued on these circumstances such multiorgan failure. She also had a TEE to note on June 29, 2015, which  showed severe mitral regurgitation.  She had a right heart catheterization and left heart catheterization as well.  During hospitalization, she had a bronchoscopy for the alveolar hemorrhage which showed some bleeding in the right mainstem by Dr. Nelda Marseille.  Had a CT scan of July 16, 2015, which was negative for acute process.  Comfort care was provided and the patient expired.  FINAL DIAGNOSES UPON DEATH: 1. Heart failure acute systolic. 2. Acute respiratory failure. 3. Ischemic cardiomyopathy. 4. Aspiration pneumonia. 5. Tongue swelling, resolved. 6. Right mainstem bleeding secondary to suction, likely. 7. GI bleed. 8. Severe encephalopathy in the setting of delirium. 9. History of lymphoma.     Raylene Miyamoto, MD     DJF/MEDQ  D:  08/03/2015  T:  08/04/2015  Job:  [id number]

## 2017-04-06 IMAGING — CR DG CHEST 2V
2 series · 2 of 2 positions shown · non-contrast
Comparison: Single AP view 03/01/2015

CLINICAL DATA: Central chest pain and shortness of breath for 3
hours.

EXAM:
CHEST  2 VIEW

[chest pa]
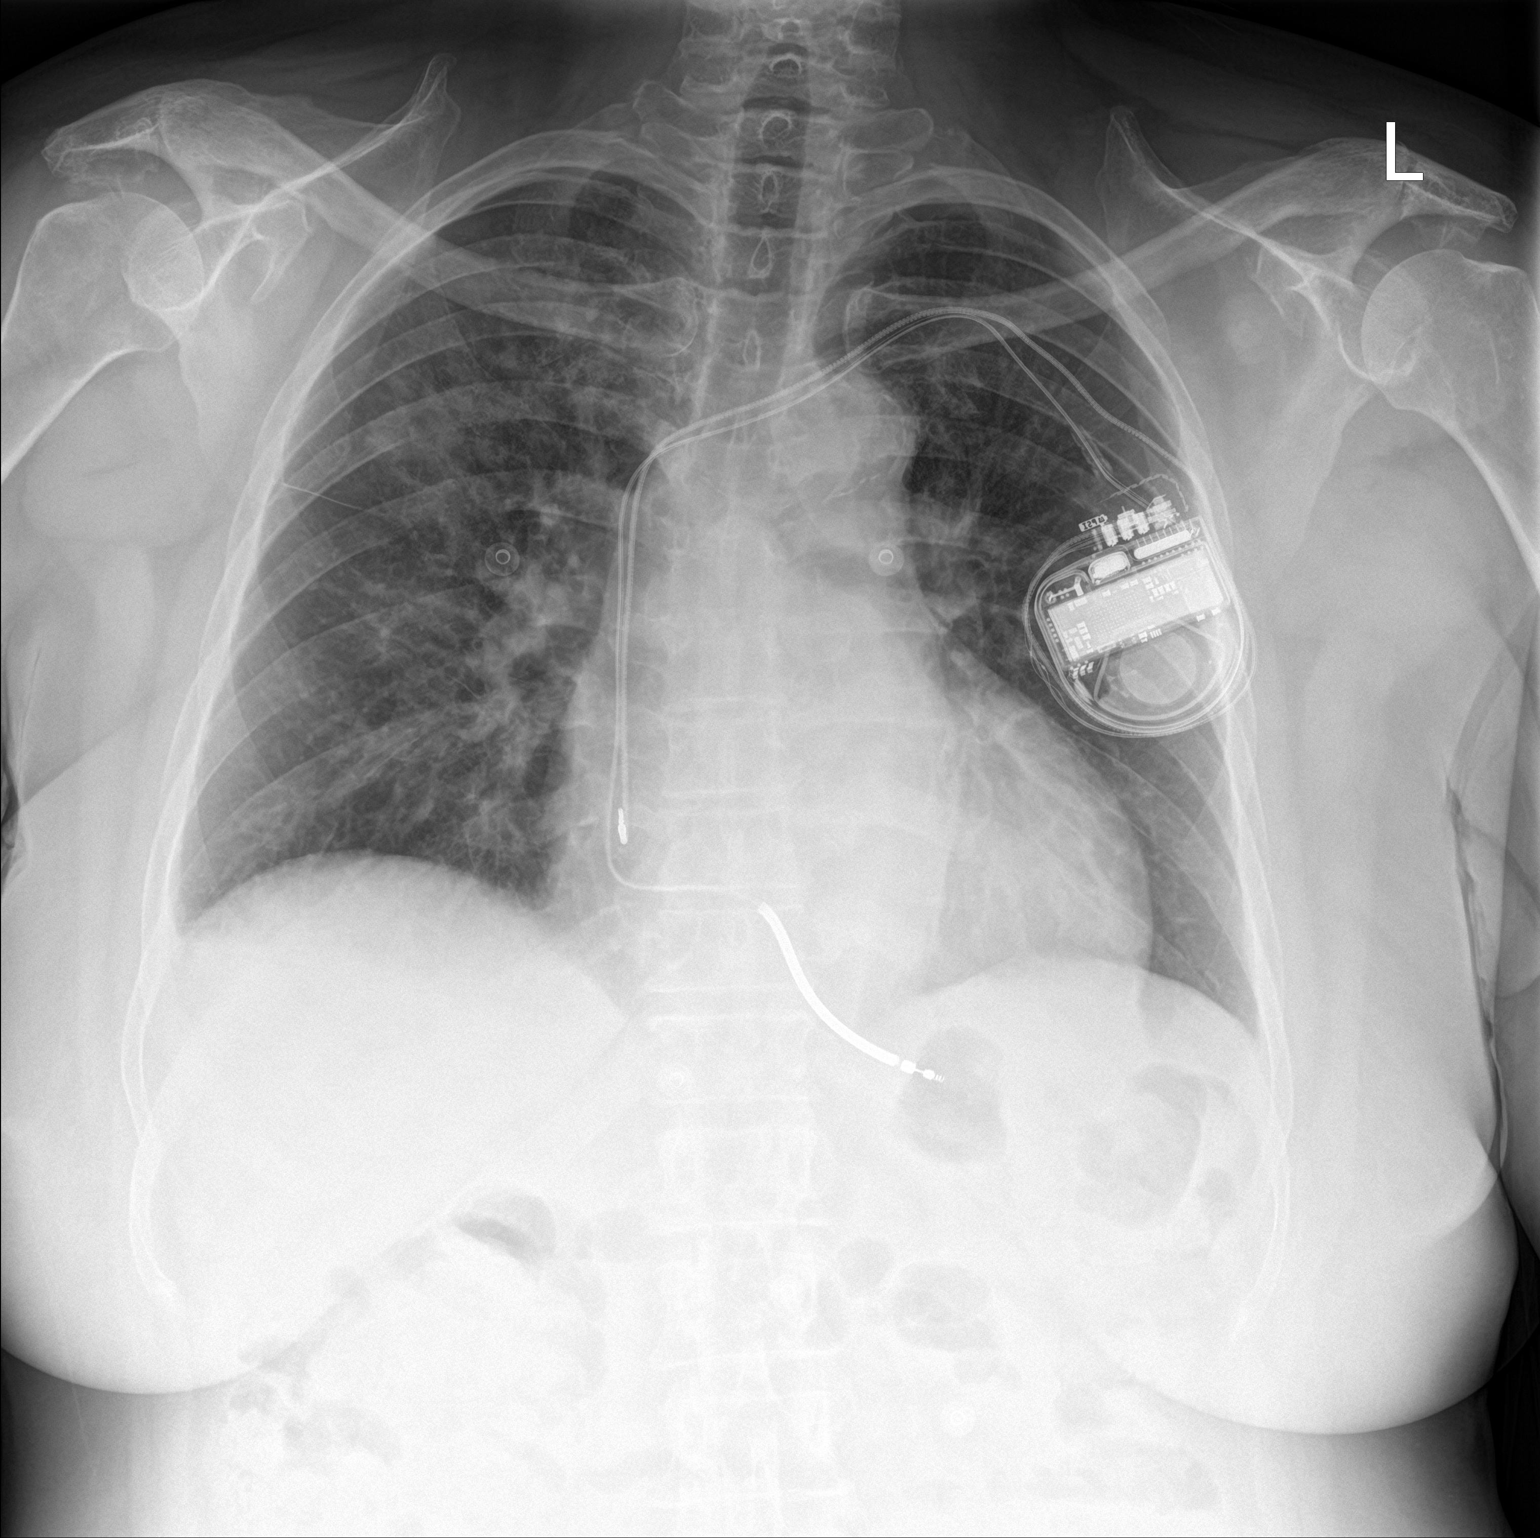

[chest lat]
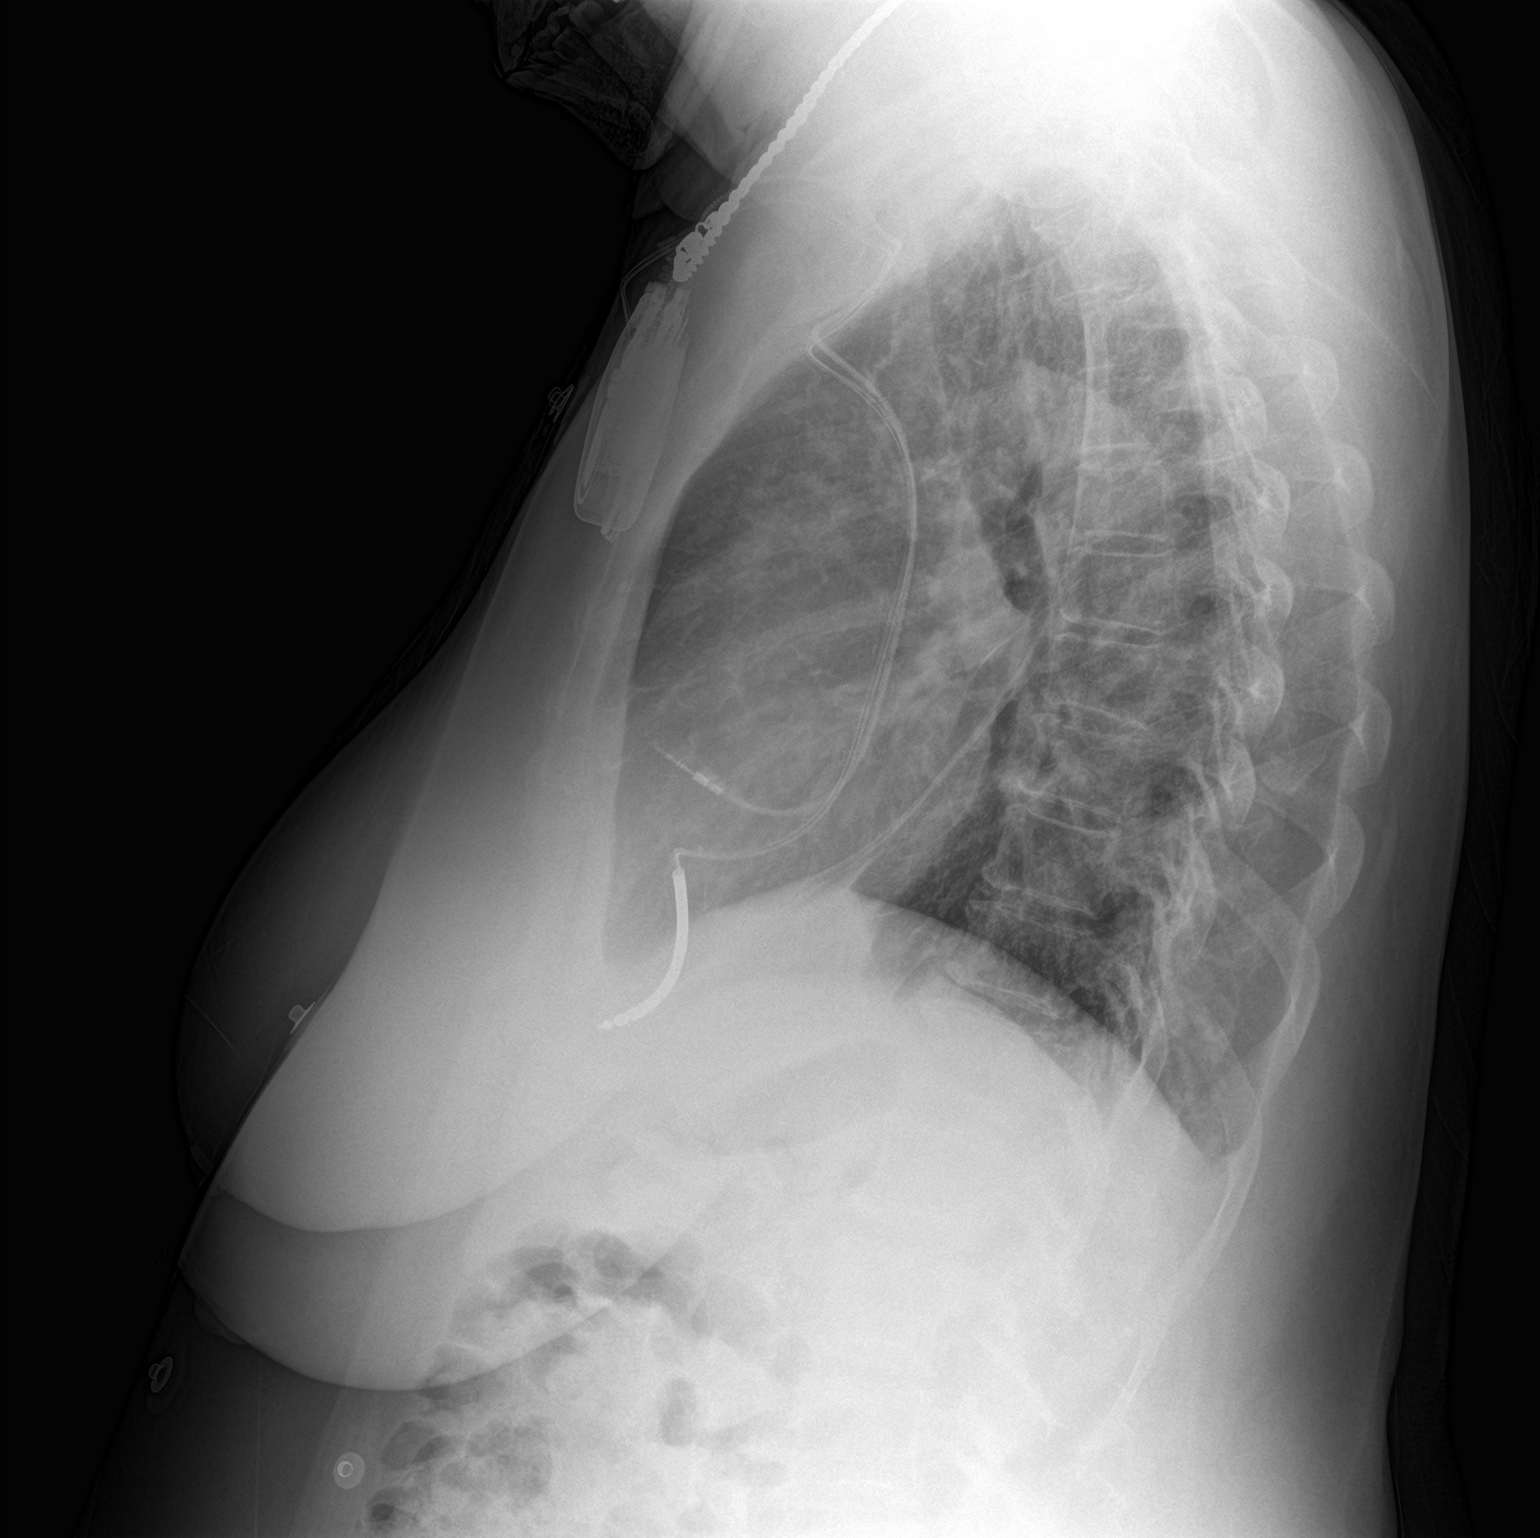

[2 of 2 positions shown; findings below may reference images not displayed]

FINDINGS: Dual lead left-sided pacemaker remains in place. Improving bibasilar
aeration from prior exam. Minimal residual right perihilar opacity
persists. Cardiomediastinal contours are unchanged. No pleural
effusion or pneumothorax. No acute osseous abnormalities.
IMPRESSION: Improving bibasilar aeration. Minimal residual right perihilar
opacity, may reflect resolving edema versus atelectasis. Exam is
otherwise unchanged.

## 2017-04-10 IMAGING — CR DG CHEST 1V PORT
1 series · 1 of 1 positions shown · non-contrast
Comparison: 03/07/2015.

CLINICAL DATA: Respiratory failure.

EXAM:
PORTABLE CHEST 1 VIEW

[AP]
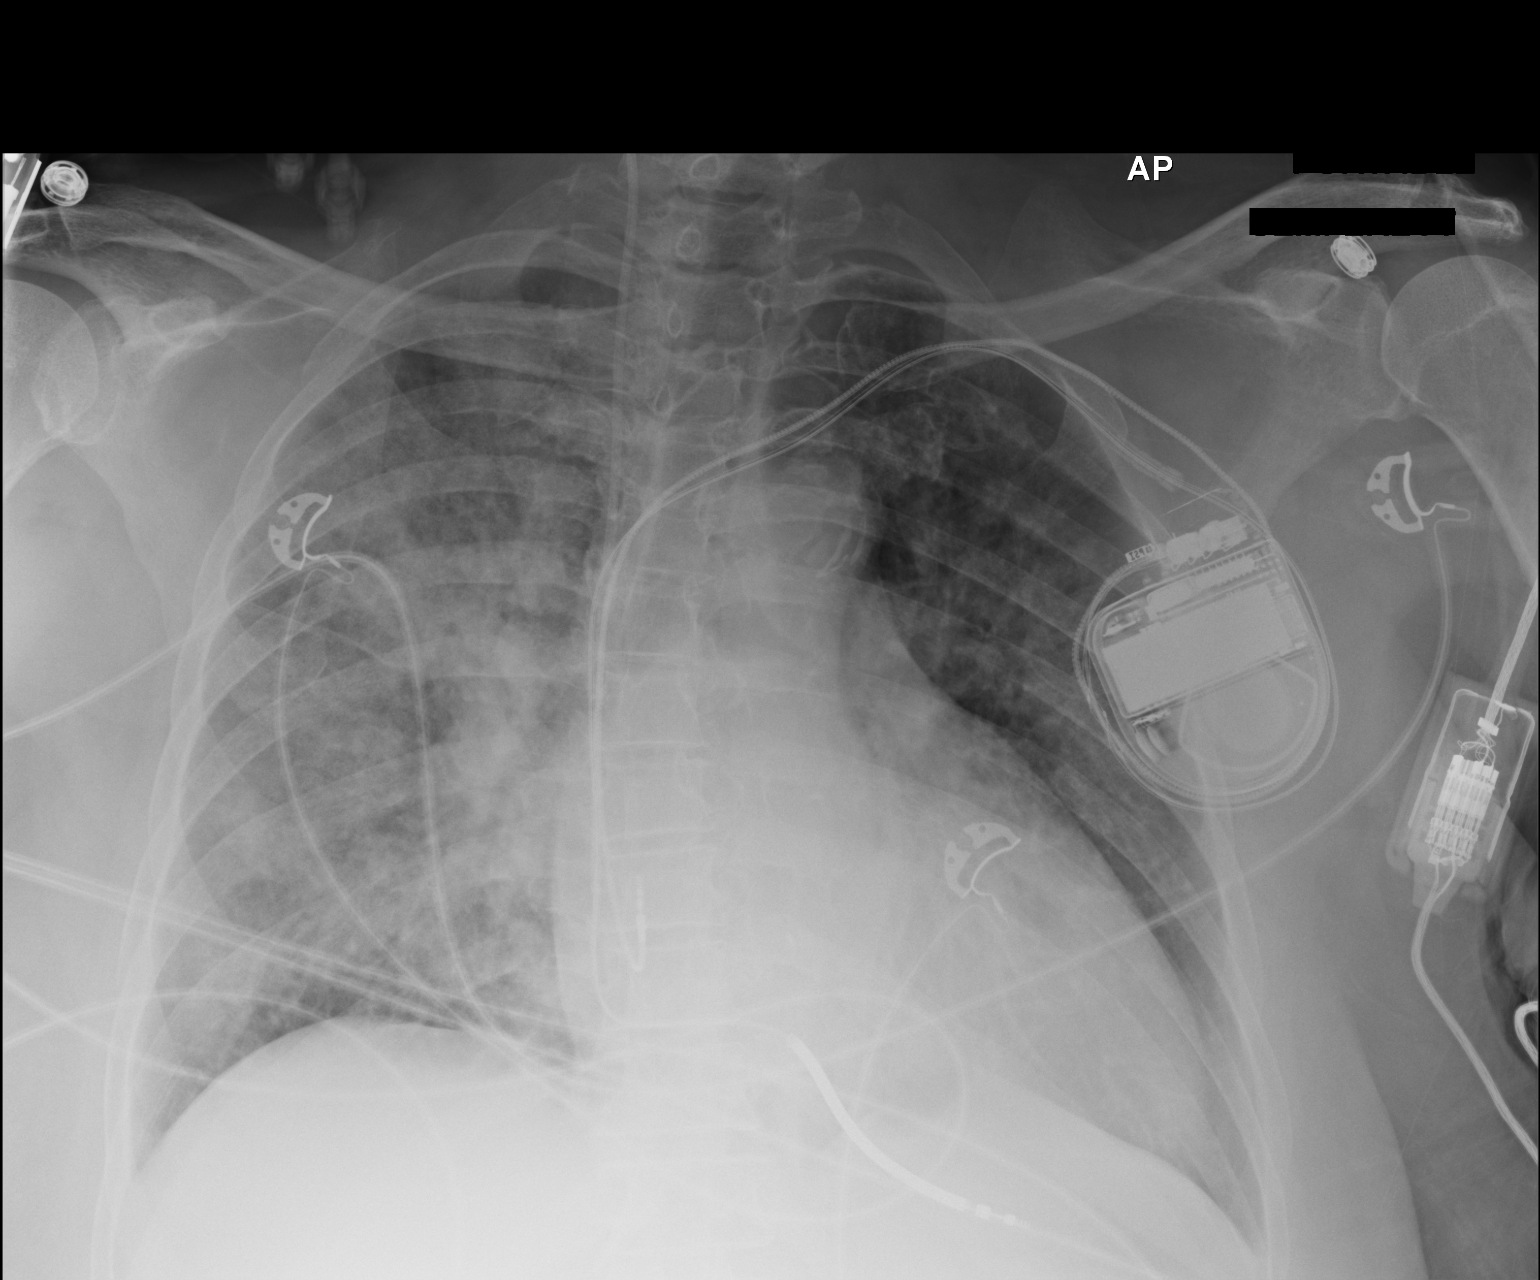

[1 of 1 positions shown; findings below may reference images not displayed]

FINDINGS: Right IJ line in stable position. Cardiac pacer with lead tips in
right atrium right ventricle. Cardiomegaly with bilateral pulmonary
infiltrates consistent with pulmonary edema. Bilateral pneumonia
cannot be excluded. No pleural effusion or pneumothorax.
IMPRESSION: 1. Right IJ line in stable position.
2. Cardiac pacer stable position. Persistent prominent cardiomegaly.
3. Persistent unchanged bilateral pulmonary infiltrates consistent
with bilateral pulmonary edema and/or pneumonia.

## 2017-04-11 IMAGING — CR DG CHEST 1V PORT
1 series · 1 of 1 positions shown · non-contrast
Comparison: 03/07/2015.

CLINICAL DATA: Respiratory failure.

EXAM:
PORTABLE CHEST 1 VIEW

[AP]
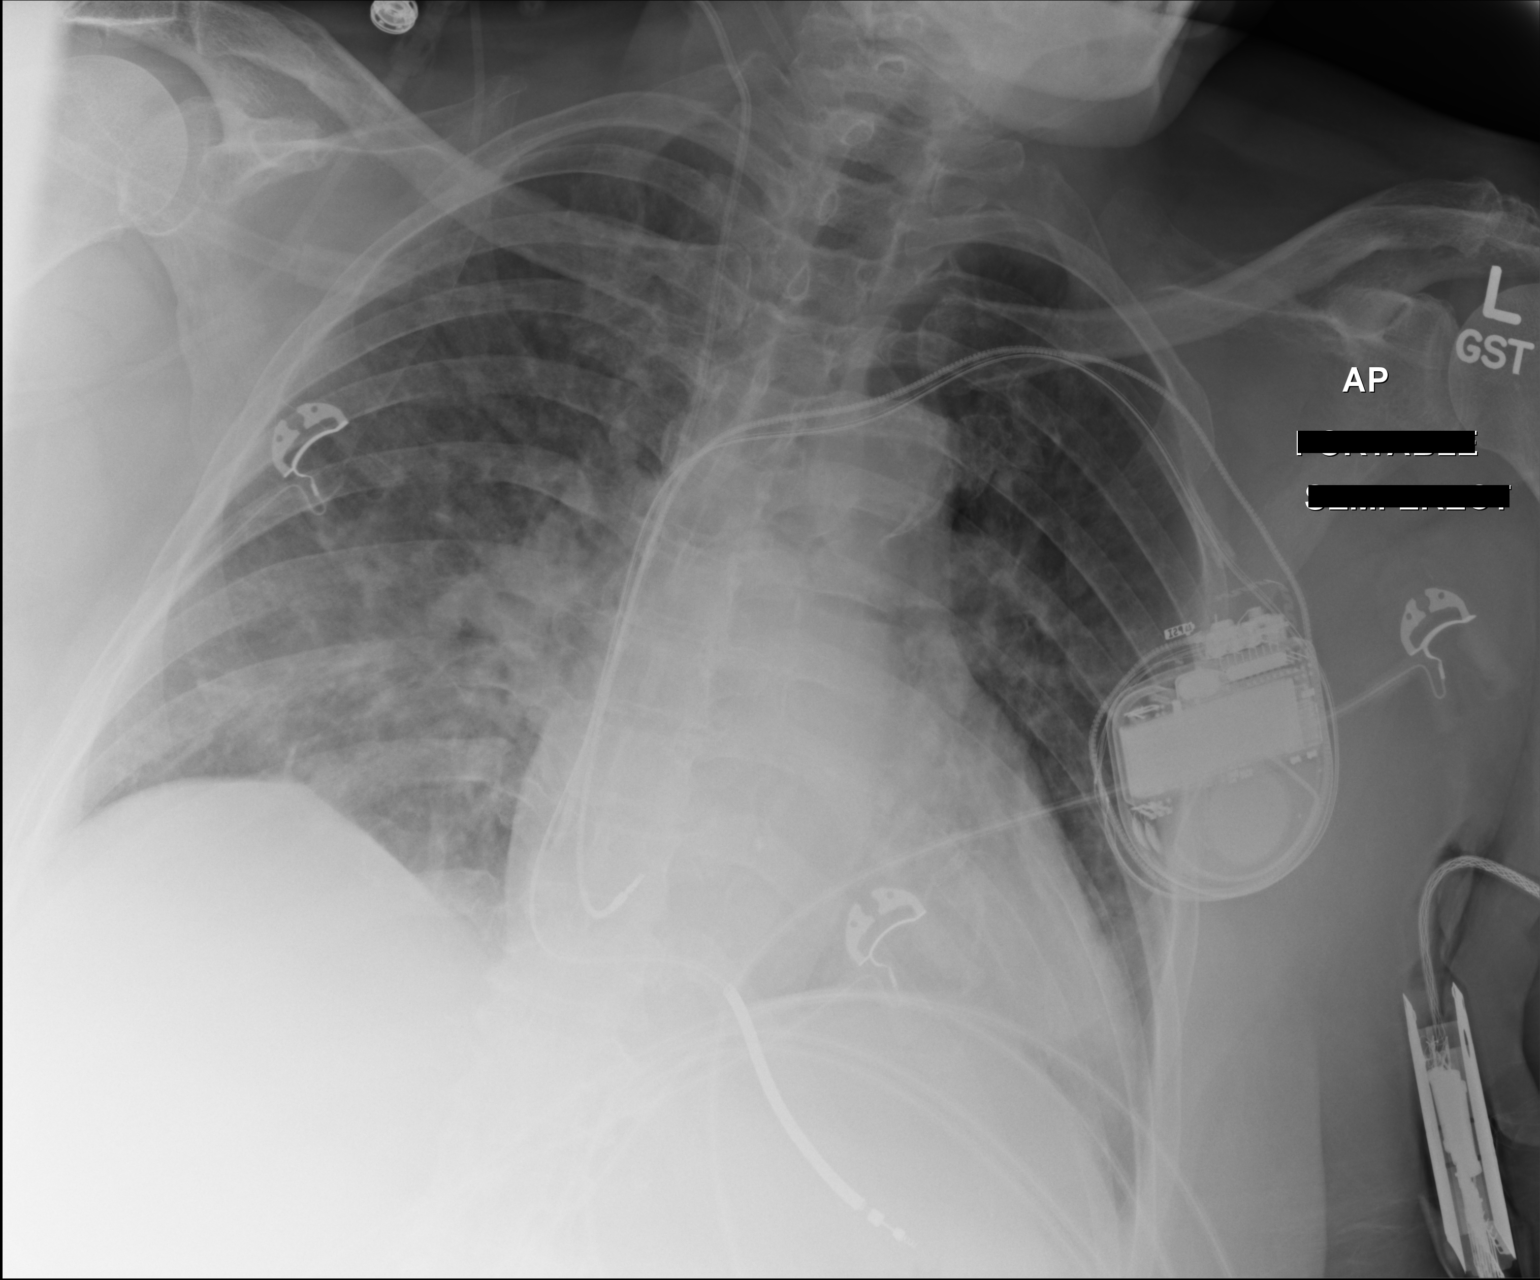

[1 of 1 positions shown; findings below may reference images not displayed]

FINDINGS: Right IJ line in stable position. Cardiac pacer noted with lead tips
in right atrium and right ventricle. Stable cardiomegaly. Interim
slight clearing of bilateral pulmonary alveolar infiltrates/edema.
No pleural effusion or pneumothorax.
IMPRESSION: 1. Right IJ line stable position.
2. Cardiac pacer stable position. Stable cardiomegaly . Interim
slight clearing of bilateral pulmonary infiltrates/edema.

## 2017-07-31 IMAGING — NM NM PULMONARY VENT & PERF
16 series · 16 of 16 positions shown · non-contrast
Comparison: VQ scan 04/14/2015.  Chest x-ray 06/26/2015.

CLINICAL DATA: Shortness of breath, chest pain since yesterday

EXAM:
NUCLEAR MEDICINE VENTILATION - PERFUSION LUNG SCAN
TECHNIQUE: Ventilation images were obtained in multiple projections using
inhaled aerosol 3c-OOm DTPA. Perfusion images were obtained in
multiple projections after intravenous injection of 3c-OOm MAA.
RADIOPHARMACEUTICALS:  31.2 mCi 4echnetium-SSm DTPA aerosol
inhalation and 4.2 mCi 4echnetium-SSm MAA IV

[Series 1: ant/post vent · 4.14mm/px · 1 of 1 slices shown (1 of 2)]
[im 1/1]
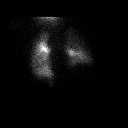

[Series 1: ant/post vent · 4.14mm/px · 1 of 1 slices shown (2 of 2)]
[im 1/1]
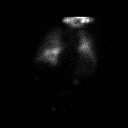

[Series 2: lao/rpo vent · 4.14mm/px · 1 of 1 slices shown (1 of 2)]
[im 1/1]
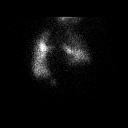

[Series 2: lao/rpo vent · 4.14mm/px · 1 of 1 slices shown (2 of 2)]
[im 1/1]
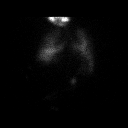

[Series 3: lpo/rao vent · 4.14mm/px · 1 of 1 slices shown (1 of 2)]
[im 1/1]
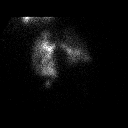

[Series 3: lpo/rao vent · 4.14mm/px · 1 of 1 slices shown (2 of 2)]
[im 1/1]
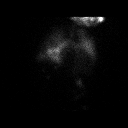

[Series 4: lt lat/rt lat vent · 4.14mm/px · 1 of 1 slices shown (1 of 2)]
[im 1/1]
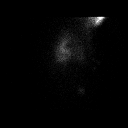

[Series 4: lt lat/rt lat vent · 4.14mm/px · 1 of 1 slices shown (2 of 2)]
[im 1/1]
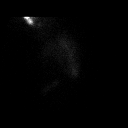

[Series 5: lt lat/rt lat perf · 4.14mm/px · 1 of 1 slices shown (1 of 2)]
[im 1/1]
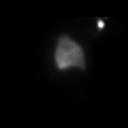

[Series 5: lt lat/rt lat perf · 4.14mm/px · 1 of 1 slices shown (2 of 2)]
[im 1/1]
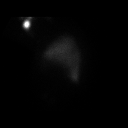

[Series 6: lpo/rao perf · 4.14mm/px · 1 of 1 slices shown (1 of 2)]
[im 1/1]
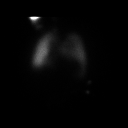

[Series 6: lpo/rao perf · 4.14mm/px · 1 of 1 slices shown (2 of 2)]
[im 1/1]
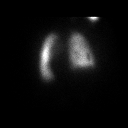

[Series 7: ant/post perf · 4.14mm/px · 1 of 1 slices shown (1 of 2)]
[im 1/1]
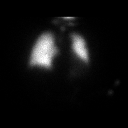

[Series 7: ant/post perf · 4.14mm/px · 1 of 1 slices shown (2 of 2)]
[im 1/1]
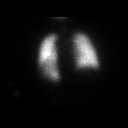

[Series 8: lao/rpo perf · 4.14mm/px · 1 of 1 slices shown (1 of 2)]
[im 1/1]
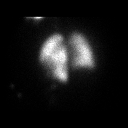

[Series 8: lao/rpo perf · 4.14mm/px · 1 of 1 slices shown (2 of 2)]
[im 1/1]
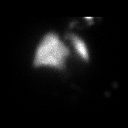

[16 of 16 positions shown; findings below may reference images not displayed]

FINDINGS: Ventilation: No focal ventilation defect.

Perfusion: No wedge shaped peripheral perfusion defects to suggest
acute pulmonary embolism.
IMPRESSION: No evidence of pulmonary embolus.
